# Patient Record
Sex: Female | Born: 1970 | Race: Black or African American | Hispanic: No | Marital: Single | Smoking: Never smoker
Health system: Southern US, Community
[De-identification: ages and names within clinical notes are randomized; demographics above are authoritative.]

## PROBLEM LIST (undated history)

## (undated) DIAGNOSIS — I639 Cerebral infarction, unspecified: Secondary | ICD-10-CM

## (undated) DIAGNOSIS — I10 Essential (primary) hypertension: Secondary | ICD-10-CM

---

## 2017-10-14 DIAGNOSIS — G459 Transient cerebral ischemic attack, unspecified: Secondary | ICD-10-CM

## 2017-10-14 HISTORY — DX: Transient cerebral ischemic attack, unspecified: G45.9

## 2022-01-20 ENCOUNTER — Inpatient Hospital Stay (HOSPITAL_COMMUNITY): Payer: No Typology Code available for payment source

## 2022-01-20 ENCOUNTER — Emergency Department (HOSPITAL_COMMUNITY): Payer: No Typology Code available for payment source

## 2022-01-20 ENCOUNTER — Inpatient Hospital Stay (HOSPITAL_COMMUNITY)
Admission: EM | Admit: 2022-01-20 | Discharge: 2022-03-21 | DRG: 004 | Disposition: A | Discharge status: Home Health | Payer: No Typology Code available for payment source | Attending: Student | Admitting: Student

## 2022-01-20 ENCOUNTER — Encounter (HOSPITAL_COMMUNITY): Payer: Self-pay | Admitting: Emergency Medicine

## 2022-01-20 DIAGNOSIS — I639 Cerebral infarction, unspecified: Secondary | ICD-10-CM | POA: Diagnosis present

## 2022-01-20 DIAGNOSIS — R54 Age-related physical debility: Secondary | ICD-10-CM | POA: Diagnosis present

## 2022-01-20 DIAGNOSIS — I10 Essential (primary) hypertension: Secondary | ICD-10-CM | POA: Diagnosis present

## 2022-01-20 DIAGNOSIS — R569 Unspecified convulsions: Secondary | ICD-10-CM

## 2022-01-20 DIAGNOSIS — Z931 Gastrostomy status: Secondary | ICD-10-CM | POA: Diagnosis not present

## 2022-01-20 DIAGNOSIS — G9341 Metabolic encephalopathy: Secondary | ICD-10-CM | POA: Diagnosis present

## 2022-01-20 DIAGNOSIS — J9601 Acute respiratory failure with hypoxia: Secondary | ICD-10-CM

## 2022-01-20 DIAGNOSIS — I160 Hypertensive urgency: Secondary | ICD-10-CM

## 2022-01-20 DIAGNOSIS — R6889 Other general symptoms and signs: Secondary | ICD-10-CM | POA: Diagnosis not present

## 2022-01-20 DIAGNOSIS — I674 Hypertensive encephalopathy: Secondary | ICD-10-CM | POA: Diagnosis present

## 2022-01-20 DIAGNOSIS — R7989 Other specified abnormal findings of blood chemistry: Secondary | ICD-10-CM

## 2022-01-20 DIAGNOSIS — L89892 Pressure ulcer of other site, stage 2: Secondary | ICD-10-CM | POA: Diagnosis not present

## 2022-01-20 DIAGNOSIS — G9340 Encephalopathy, unspecified: Secondary | ICD-10-CM | POA: Diagnosis present

## 2022-01-20 DIAGNOSIS — E512 Wernicke's encephalopathy: Secondary | ICD-10-CM | POA: Diagnosis present

## 2022-01-20 DIAGNOSIS — E662 Morbid (severe) obesity with alveolar hypoventilation: Secondary | ICD-10-CM | POA: Diagnosis present

## 2022-01-20 DIAGNOSIS — R339 Retention of urine, unspecified: Secondary | ICD-10-CM | POA: Diagnosis not present

## 2022-01-20 DIAGNOSIS — N179 Acute kidney failure, unspecified: Secondary | ICD-10-CM

## 2022-01-20 DIAGNOSIS — R338 Other retention of urine: Secondary | ICD-10-CM

## 2022-01-20 DIAGNOSIS — E875 Hyperkalemia: Secondary | ICD-10-CM | POA: Diagnosis not present

## 2022-01-20 DIAGNOSIS — I6340 Cerebral infarction due to embolism of unspecified cerebral artery: Secondary | ICD-10-CM | POA: Insufficient documentation

## 2022-01-20 DIAGNOSIS — R739 Hyperglycemia, unspecified: Secondary | ICD-10-CM | POA: Diagnosis not present

## 2022-01-20 DIAGNOSIS — Z515 Encounter for palliative care: Secondary | ICD-10-CM | POA: Diagnosis not present

## 2022-01-20 DIAGNOSIS — E162 Hypoglycemia, unspecified: Secondary | ICD-10-CM | POA: Diagnosis not present

## 2022-01-20 DIAGNOSIS — I6629 Occlusion and stenosis of unspecified posterior cerebral artery: Secondary | ICD-10-CM | POA: Diagnosis present

## 2022-01-20 DIAGNOSIS — Z885 Allergy status to narcotic agent status: Secondary | ICD-10-CM

## 2022-01-20 DIAGNOSIS — R29729 NIHSS score 29: Secondary | ICD-10-CM | POA: Diagnosis present

## 2022-01-20 DIAGNOSIS — Z9911 Dependence on respirator [ventilator] status: Secondary | ICD-10-CM | POA: Diagnosis not present

## 2022-01-20 DIAGNOSIS — R1310 Dysphagia, unspecified: Secondary | ICD-10-CM | POA: Diagnosis not present

## 2022-01-20 DIAGNOSIS — E874 Mixed disorder of acid-base balance: Secondary | ICD-10-CM | POA: Diagnosis present

## 2022-01-20 DIAGNOSIS — Z886 Allergy status to analgesic agent status: Secondary | ICD-10-CM

## 2022-01-20 DIAGNOSIS — I63513 Cerebral infarction due to unspecified occlusion or stenosis of bilateral middle cerebral arteries: Secondary | ICD-10-CM | POA: Diagnosis not present

## 2022-01-20 DIAGNOSIS — F10931 Alcohol use, unspecified with withdrawal delirium: Secondary | ICD-10-CM | POA: Diagnosis not present

## 2022-01-20 DIAGNOSIS — I63433 Cerebral infarction due to embolism of bilateral posterior cerebral arteries: Secondary | ICD-10-CM | POA: Diagnosis present

## 2022-01-20 DIAGNOSIS — Z87892 Personal history of anaphylaxis: Secondary | ICD-10-CM

## 2022-01-20 DIAGNOSIS — D6862 Lupus anticoagulant syndrome: Secondary | ICD-10-CM | POA: Diagnosis not present

## 2022-01-20 DIAGNOSIS — J9690 Respiratory failure, unspecified, unspecified whether with hypoxia or hypercapnia: Secondary | ICD-10-CM | POA: Diagnosis present

## 2022-01-20 DIAGNOSIS — E440 Moderate protein-calorie malnutrition: Secondary | ICD-10-CM | POA: Diagnosis present

## 2022-01-20 DIAGNOSIS — I69391 Dysphagia following cerebral infarction: Secondary | ICD-10-CM

## 2022-01-20 DIAGNOSIS — E872 Acidosis, unspecified: Secondary | ICD-10-CM

## 2022-01-20 DIAGNOSIS — J9620 Acute and chronic respiratory failure, unspecified whether with hypoxia or hypercapnia: Secondary | ICD-10-CM | POA: Diagnosis not present

## 2022-01-20 DIAGNOSIS — E878 Other disorders of electrolyte and fluid balance, not elsewhere classified: Secondary | ICD-10-CM

## 2022-01-20 DIAGNOSIS — R509 Fever, unspecified: Secondary | ICD-10-CM

## 2022-01-20 DIAGNOSIS — Z930 Tracheostomy status: Secondary | ICD-10-CM | POA: Diagnosis not present

## 2022-01-20 DIAGNOSIS — R40244 Other coma, without documented Glasgow coma scale score, or with partial score reported, unspecified time: Secondary | ICD-10-CM | POA: Diagnosis not present

## 2022-01-20 DIAGNOSIS — Z6830 Body mass index (BMI) 30.0-30.9, adult: Secondary | ICD-10-CM | POA: Diagnosis not present

## 2022-01-20 DIAGNOSIS — R4020 Unspecified coma: Secondary | ICD-10-CM | POA: Diagnosis present

## 2022-01-20 DIAGNOSIS — R4182 Altered mental status, unspecified: Secondary | ICD-10-CM | POA: Insufficient documentation

## 2022-01-20 DIAGNOSIS — E785 Hyperlipidemia, unspecified: Secondary | ICD-10-CM | POA: Diagnosis present

## 2022-01-20 DIAGNOSIS — R4189 Other symptoms and signs involving cognitive functions and awareness: Secondary | ICD-10-CM | POA: Diagnosis not present

## 2022-01-20 DIAGNOSIS — L8990 Pressure ulcer of unspecified site, unspecified stage: Secondary | ICD-10-CM | POA: Diagnosis not present

## 2022-01-20 DIAGNOSIS — R451 Restlessness and agitation: Secondary | ICD-10-CM | POA: Diagnosis present

## 2022-01-20 DIAGNOSIS — Y95 Nosocomial condition: Secondary | ICD-10-CM | POA: Diagnosis not present

## 2022-01-20 DIAGNOSIS — Z7189 Other specified counseling: Secondary | ICD-10-CM | POA: Diagnosis not present

## 2022-01-20 DIAGNOSIS — F10139 Alcohol abuse with withdrawal, unspecified: Secondary | ICD-10-CM | POA: Diagnosis present

## 2022-01-20 DIAGNOSIS — Z880 Allergy status to penicillin: Secondary | ICD-10-CM

## 2022-01-20 DIAGNOSIS — D649 Anemia, unspecified: Secondary | ICD-10-CM

## 2022-01-20 DIAGNOSIS — R1312 Dysphagia, oropharyngeal phase: Secondary | ICD-10-CM | POA: Diagnosis not present

## 2022-01-20 DIAGNOSIS — N390 Urinary tract infection, site not specified: Secondary | ICD-10-CM

## 2022-01-20 DIAGNOSIS — I6381 Other cerebral infarction due to occlusion or stenosis of small artery: Secondary | ICD-10-CM | POA: Diagnosis not present

## 2022-01-20 DIAGNOSIS — J189 Pneumonia, unspecified organism: Secondary | ICD-10-CM

## 2022-01-20 DIAGNOSIS — G908 Other disorders of autonomic nervous system: Secondary | ICD-10-CM | POA: Diagnosis present

## 2022-01-20 DIAGNOSIS — Z91018 Allergy to other foods: Secondary | ICD-10-CM

## 2022-01-20 DIAGNOSIS — Y900 Blood alcohol level of less than 20 mg/100 ml: Secondary | ICD-10-CM | POA: Diagnosis present

## 2022-01-20 DIAGNOSIS — D638 Anemia in other chronic diseases classified elsewhere: Secondary | ICD-10-CM | POA: Diagnosis present

## 2022-01-20 DIAGNOSIS — J9811 Atelectasis: Secondary | ICD-10-CM | POA: Diagnosis not present

## 2022-01-20 DIAGNOSIS — M25462 Effusion, left knee: Secondary | ICD-10-CM | POA: Diagnosis not present

## 2022-01-20 DIAGNOSIS — E876 Hypokalemia: Secondary | ICD-10-CM | POA: Diagnosis not present

## 2022-01-20 DIAGNOSIS — E870 Hyperosmolality and hypernatremia: Secondary | ICD-10-CM

## 2022-01-20 DIAGNOSIS — R6510 Systemic inflammatory response syndrome (SIRS) of non-infectious origin without acute organ dysfunction: Secondary | ICD-10-CM

## 2022-01-20 DIAGNOSIS — I471 Supraventricular tachycardia: Secondary | ICD-10-CM | POA: Diagnosis present

## 2022-01-20 DIAGNOSIS — Z888 Allergy status to other drugs, medicaments and biological substances status: Secondary | ICD-10-CM

## 2022-01-20 DIAGNOSIS — I6312 Cerebral infarction due to embolism of basilar artery: Secondary | ICD-10-CM | POA: Diagnosis not present

## 2022-01-20 DIAGNOSIS — R400 Somnolence: Secondary | ICD-10-CM

## 2022-01-20 DIAGNOSIS — D6861 Antiphospholipid syndrome: Secondary | ICD-10-CM | POA: Diagnosis present

## 2022-01-20 DIAGNOSIS — D72829 Elevated white blood cell count, unspecified: Secondary | ICD-10-CM

## 2022-01-20 DIAGNOSIS — Z833 Family history of diabetes mellitus: Secondary | ICD-10-CM

## 2022-01-20 DIAGNOSIS — Z9889 Other specified postprocedural states: Secondary | ICD-10-CM | POA: Diagnosis not present

## 2022-01-20 DIAGNOSIS — Z91148 Patient's other noncompliance with medication regimen for other reason: Secondary | ICD-10-CM

## 2022-01-20 DIAGNOSIS — Z79899 Other long term (current) drug therapy: Secondary | ICD-10-CM

## 2022-01-20 DIAGNOSIS — I6389 Other cerebral infarction: Secondary | ICD-10-CM | POA: Diagnosis not present

## 2022-01-20 DIAGNOSIS — G9089 Other disorders of autonomic nervous system: Secondary | ICD-10-CM

## 2022-01-20 HISTORY — DX: Hypertensive urgency: I16.0

## 2022-01-20 HISTORY — DX: Essential (primary) hypertension: I10

## 2022-01-20 LAB — URINALYSIS, ROUTINE W REFLEX MICROSCOPIC
Bilirubin Urine: NEGATIVE
Glucose, UA: NEGATIVE mg/dL
Ketones, ur: NEGATIVE mg/dL
Nitrite: POSITIVE — AB
Protein, ur: NEGATIVE mg/dL
Specific Gravity, Urine: 1.011 (ref 1.005–1.030)
pH: 6 (ref 5.0–8.0)

## 2022-01-20 LAB — CBC WITH DIFFERENTIAL/PLATELET
Abs Immature Granulocytes: 0.03 10*3/uL (ref 0.00–0.07)
Abs Immature Granulocytes: 0.03 10*3/uL (ref 0.00–0.07)
Basophils Absolute: 0 10*3/uL (ref 0.0–0.1)
Basophils Absolute: 0 10*3/uL (ref 0.0–0.1)
Basophils Relative: 0 %
Basophils Relative: 0 %
Eosinophils Absolute: 0.1 10*3/uL (ref 0.0–0.5)
Eosinophils Absolute: 0.1 10*3/uL (ref 0.0–0.5)
Eosinophils Relative: 1 %
Eosinophils Relative: 1 %
HCT: 40.9 % (ref 36.0–46.0)
HCT: 32.8 % — ABNORMAL LOW (ref 36.0–46.0)
Hemoglobin: 13.4 g/dL (ref 12.0–15.0)
Hemoglobin: 10.7 g/dL — ABNORMAL LOW (ref 12.0–15.0)
Immature Granulocytes: 0 %
Immature Granulocytes: 0 %
Lymphocytes Relative: 27 %
Lymphocytes Relative: 13 %
Lymphs Abs: 2.1 10*3/uL (ref 0.7–4.0)
Lymphs Abs: 1.2 10*3/uL (ref 0.7–4.0)
MCH: 27.1 pg (ref 26.0–34.0)
MCH: 27.1 pg (ref 26.0–34.0)
MCHC: 32.8 g/dL (ref 30.0–36.0)
MCHC: 32.6 g/dL (ref 30.0–36.0)
MCV: 82.8 fL (ref 80.0–100.0)
MCV: 83 fL (ref 80.0–100.0)
Monocytes Absolute: 0.6 10*3/uL (ref 0.1–1.0)
Monocytes Absolute: 0.6 10*3/uL (ref 0.1–1.0)
Monocytes Relative: 7 %
Monocytes Relative: 6 %
Neutro Abs: 5 10*3/uL (ref 1.7–7.7)
Neutro Abs: 7.6 10*3/uL (ref 1.7–7.7)
Neutrophils Relative %: 65 %
Neutrophils Relative %: 80 %
Platelets: 231 10*3/uL (ref 150–400)
Platelets: 213 10*3/uL (ref 150–400)
RBC: 4.94 MIL/uL (ref 3.87–5.11)
RBC: 3.95 MIL/uL (ref 3.87–5.11)
RDW: 15.5 % (ref 11.5–15.5)
RDW: 15.1 % (ref 11.5–15.5)
WBC: 7.8 10*3/uL (ref 4.0–10.5)
WBC: 9.6 10*3/uL (ref 4.0–10.5)
nRBC: 0 % (ref 0.0–0.2)
nRBC: 0 % (ref 0.0–0.2)

## 2022-01-20 LAB — I-STAT VENOUS BLOOD GAS, ED
Acid-Base Excess: 1 mmol/L (ref 0.0–2.0)
Bicarbonate: 25.8 mmol/L (ref 20.0–28.0)
Calcium, Ion: 1.2 mmol/L (ref 1.15–1.40)
HCT: 40 % (ref 36.0–46.0)
Hemoglobin: 13.6 g/dL (ref 12.0–15.0)
O2 Saturation: 87 %
Potassium: 4 mmol/L (ref 3.5–5.1)
Sodium: 139 mmol/L (ref 135–145)
TCO2: 27 mmol/L (ref 22–32)
pCO2, Ven: 42.2 mmHg — ABNORMAL LOW (ref 44–60)
pH, Ven: 7.394 (ref 7.25–7.43)
pO2, Ven: 54 mmHg — ABNORMAL HIGH (ref 32–45)

## 2022-01-20 LAB — COMPREHENSIVE METABOLIC PANEL
ALT: 17 U/L (ref 0–44)
AST: 15 U/L (ref 15–41)
Albumin: 3.7 g/dL (ref 3.5–5.0)
Alkaline Phosphatase: 58 U/L (ref 38–126)
Anion gap: 9 (ref 5–15)
BUN: 10 mg/dL (ref 6–20)
CO2: 24 mmol/L (ref 22–32)
Calcium: 9.4 mg/dL (ref 8.9–10.3)
Chloride: 105 mmol/L (ref 98–111)
Creatinine, Ser: 0.83 mg/dL (ref 0.44–1.00)
GFR, Estimated: >60 mL/min (ref >60)
Glucose, Bld: 87 mg/dL (ref 70–99)
Potassium: 4 mmol/L (ref 3.5–5.1)
Sodium: 138 mmol/L (ref 135–145)
Total Bilirubin: 0.6 mg/dL (ref 0.3–1.2)
Total Protein: 7.1 g/dL (ref 6.5–8.1)

## 2022-01-20 LAB — LACTIC ACID, PLASMA
Lactic Acid, Venous: 1.2 mmol/L (ref 0.5–1.9)
Lactic Acid, Venous: 3.2 mmol/L (ref 0.5–1.9)
Lactic Acid, Venous: 3.3 mmol/L (ref 0.5–1.9)

## 2022-01-20 LAB — BASIC METABOLIC PANEL
Anion gap: 6 (ref 5–15)
BUN: 10 mg/dL (ref 6–20)
CO2: 23 mmol/L (ref 22–32)
Calcium: 8.2 mg/dL — ABNORMAL LOW (ref 8.9–10.3)
Chloride: 109 mmol/L (ref 98–111)
Creatinine, Ser: 0.72 mg/dL (ref 0.44–1.00)
GFR, Estimated: >60 mL/min (ref >60)
Glucose, Bld: 113 mg/dL — ABNORMAL HIGH (ref 70–99)
Potassium: 4.4 mmol/L (ref 3.5–5.1)
Sodium: 138 mmol/L (ref 135–145)

## 2022-01-20 LAB — POCT I-STAT 7, (LYTES, BLD GAS, ICA,H+H)
Acid-base deficit: 5 mmol/L — ABNORMAL HIGH (ref 0.0–2.0)
Bicarbonate: 21.1 mmol/L (ref 20.0–28.0)
Calcium, Ion: 1.2 mmol/L (ref 1.15–1.40)
HCT: 30 % — ABNORMAL LOW (ref 36.0–46.0)
Hemoglobin: 10.2 g/dL — ABNORMAL LOW (ref 12.0–15.0)
O2 Saturation: 95 %
Patient temperature: 98.2
Potassium: 3.4 mmol/L — ABNORMAL LOW (ref 3.5–5.1)
Sodium: 141 mmol/L (ref 135–145)
TCO2: 22 mmol/L (ref 22–32)
pCO2 arterial: 39.8 mmHg (ref 32–48)
pH, Arterial: 7.33 — ABNORMAL LOW (ref 7.35–7.45)
pO2, Arterial: 82 mmHg — ABNORMAL LOW (ref 83–108)

## 2022-01-20 LAB — HEPATIC FUNCTION PANEL
ALT: 19 U/L (ref 0–44)
AST: 20 U/L (ref 15–41)
Albumin: 3 g/dL — ABNORMAL LOW (ref 3.5–5.0)
Alkaline Phosphatase: 46 U/L (ref 38–126)
Bilirubin, Direct: 0.3 mg/dL — ABNORMAL HIGH (ref 0.0–0.2)
Indirect Bilirubin: 0.4 mg/dL (ref 0.3–0.9)
Total Bilirubin: 0.7 mg/dL (ref 0.3–1.2)
Total Protein: 6.2 g/dL — ABNORMAL LOW (ref 6.5–8.1)

## 2022-01-20 LAB — I-STAT ARTERIAL BLOOD GAS, ED
Acid-base deficit: 1 mmol/L (ref 0.0–2.0)
Bicarbonate: 26.4 mmol/L (ref 20.0–28.0)
Calcium, Ion: 1.18 mmol/L (ref 1.15–1.40)
HCT: 33 % — ABNORMAL LOW (ref 36.0–46.0)
Hemoglobin: 11.2 g/dL — ABNORMAL LOW (ref 12.0–15.0)
O2 Saturation: 95 %
Potassium: 3.6 mmol/L (ref 3.5–5.1)
Sodium: 138 mmol/L (ref 135–145)
TCO2: 28 mmol/L (ref 22–32)
pCO2 arterial: 52.8 mmHg — ABNORMAL HIGH (ref 32–48)
pH, Arterial: 7.307 — ABNORMAL LOW (ref 7.35–7.45)
pO2, Arterial: 84 mmHg (ref 83–108)

## 2022-01-20 LAB — RAPID URINE DRUG SCREEN, HOSP PERFORMED
Amphetamines: NOT DETECTED
Barbiturates: NOT DETECTED
Benzodiazepines: NOT DETECTED
Cocaine: NOT DETECTED
Opiates: NOT DETECTED
Tetrahydrocannabinol: NOT DETECTED

## 2022-01-20 LAB — CREATININE, SERUM
Creatinine, Ser: 0.76 mg/dL (ref 0.44–1.00)
GFR, Estimated: >60 mL/min (ref >60)

## 2022-01-20 LAB — CBG MONITORING, ED
Glucose-Capillary: 57 mg/dL — ABNORMAL LOW (ref 70–99)
Glucose-Capillary: 78 mg/dL (ref 70–99)
Glucose-Capillary: 87 mg/dL (ref 70–99)

## 2022-01-20 LAB — PHOSPHORUS: Phosphorus: 3.4 mg/dL (ref 2.5–4.6)

## 2022-01-20 LAB — AMMONIA
Ammonia: 28 umol/L (ref 9–35)
Ammonia: 49 umol/L — ABNORMAL HIGH (ref 9–35)

## 2022-01-20 LAB — PROCALCITONIN: Procalcitonin: <0.1 ng/mL

## 2022-01-20 LAB — MAGNESIUM: Magnesium: 1.8 mg/dL (ref 1.7–2.4)

## 2022-01-20 LAB — POTASSIUM: Potassium: 3.7 mmol/L (ref 3.5–5.1)

## 2022-01-20 LAB — I-STAT CHEM 8, ED
BUN: 11 mg/dL (ref 6–20)
Calcium, Ion: 1.18 mmol/L (ref 1.15–1.40)
Chloride: 105 mmol/L (ref 98–111)
Creatinine, Ser: 0.7 mg/dL (ref 0.44–1.00)
Glucose, Bld: 84 mg/dL (ref 70–99)
HCT: 41 % (ref 36.0–46.0)
Hemoglobin: 13.9 g/dL (ref 12.0–15.0)
Potassium: 4 mmol/L (ref 3.5–5.1)
Sodium: 138 mmol/L (ref 135–145)
TCO2: 25 mmol/L (ref 22–32)

## 2022-01-20 LAB — GLUCOSE, CAPILLARY
Glucose-Capillary: 111 mg/dL — ABNORMAL HIGH (ref 70–99)
Glucose-Capillary: 117 mg/dL — ABNORMAL HIGH (ref 70–99)
Glucose-Capillary: 121 mg/dL — ABNORMAL HIGH (ref 70–99)

## 2022-01-20 LAB — HIV ANTIBODY (ROUTINE TESTING W REFLEX): HIV Screen 4th Generation wRfx: NONREACTIVE

## 2022-01-20 LAB — PROTIME-INR
INR: 1.2 (ref 0.8–1.2)
Prothrombin Time: 14.6 seconds (ref 11.4–15.2)

## 2022-01-20 LAB — APTT: aPTT: 29 seconds (ref 24–36)

## 2022-01-20 LAB — STREP PNEUMONIAE URINARY ANTIGEN: Strep Pneumo Urinary Antigen: NEGATIVE

## 2022-01-20 LAB — MRSA NEXT GEN BY PCR, NASAL: MRSA by PCR Next Gen: NOT DETECTED

## 2022-01-20 LAB — CORTISOL: Cortisol, Plasma: 16.9 ug/dL

## 2022-01-20 LAB — ETHANOL: Alcohol, Ethyl (B): <10 mg/dL (ref <10)

## 2022-01-20 IMAGING — CT CT HEAD W/O CM
4 series · 15 of 37 positions shown, 17 images · non-contrast
Comparison: None.

CLINICAL DATA: Altered mental status



[Series 2: head wo · axial · 0.46mm/px · z∈[-125,-35]mm · 4 of 32 slices shown (1 of 2)]
[im 7/32  brain]
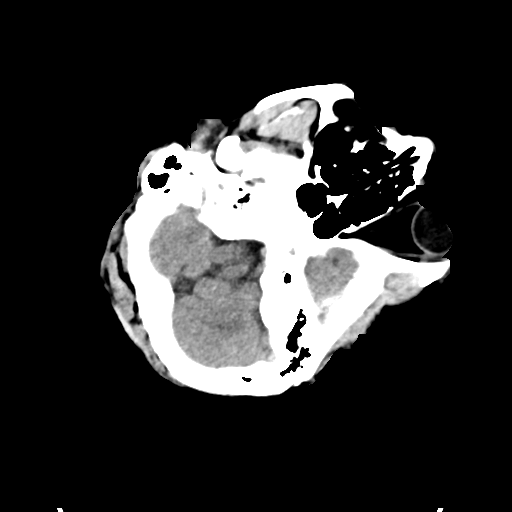
[im 13/32  brain]
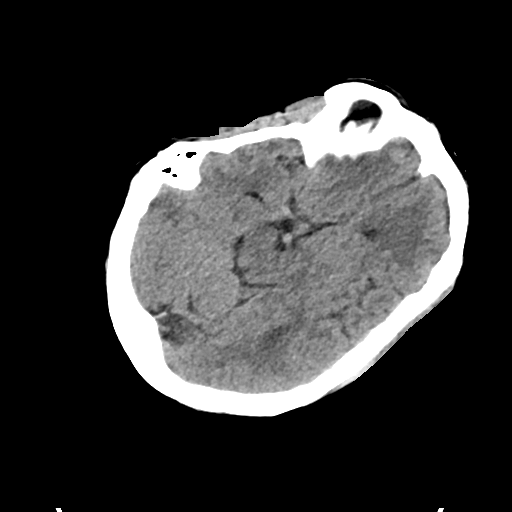
[im 19/32  brain]
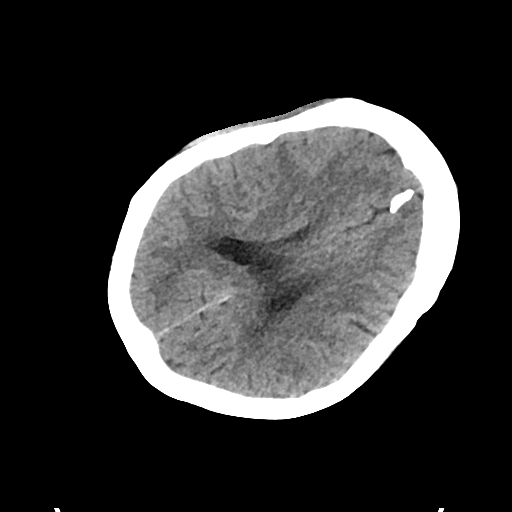
[im 25/32  brain]
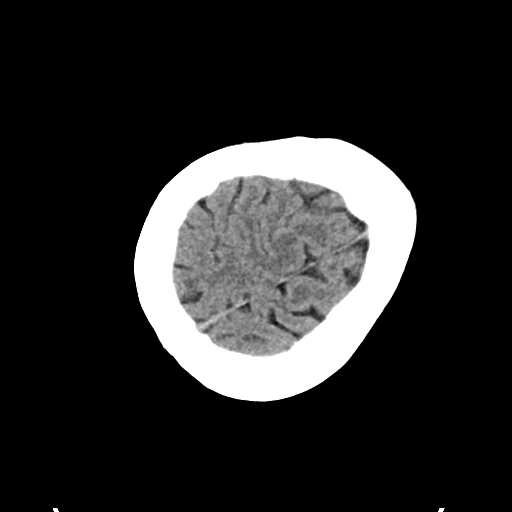

[Series 3: head bone · axial · 0.46mm/px · z∈[-145,-125]mm · 2 of 79 slices shown]
[im 6/79  bone]
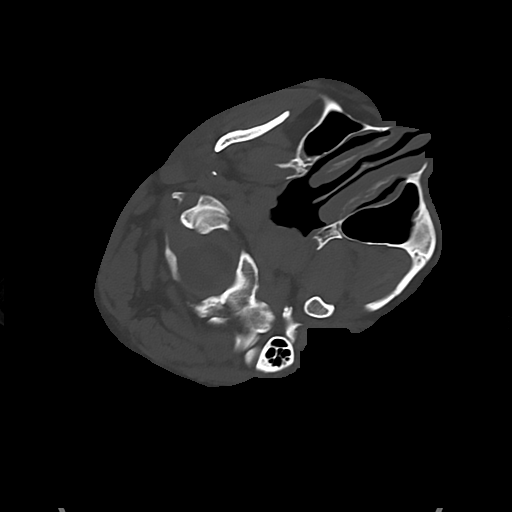
[im 16/79  bone]
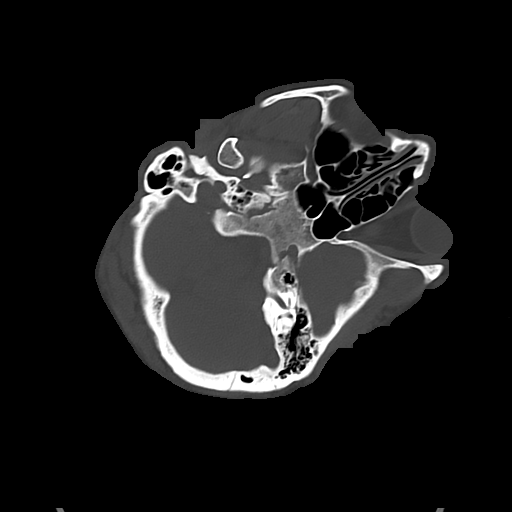

[Series 4: cor soft · sagittal · 0.35mm/px · 3 of 88 slices shown]
[im 30/88  brain]
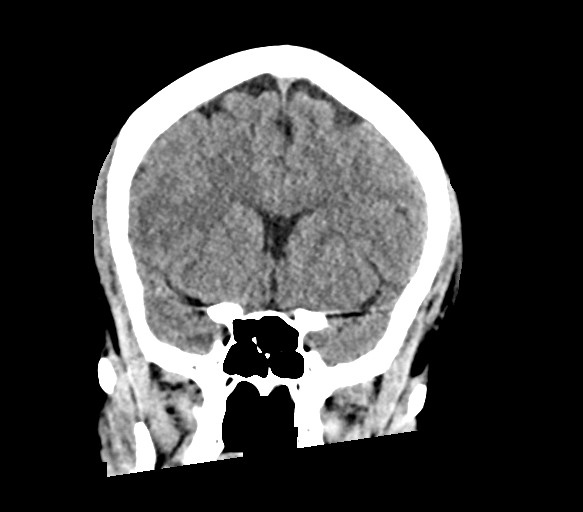
[im 44/88  brain]
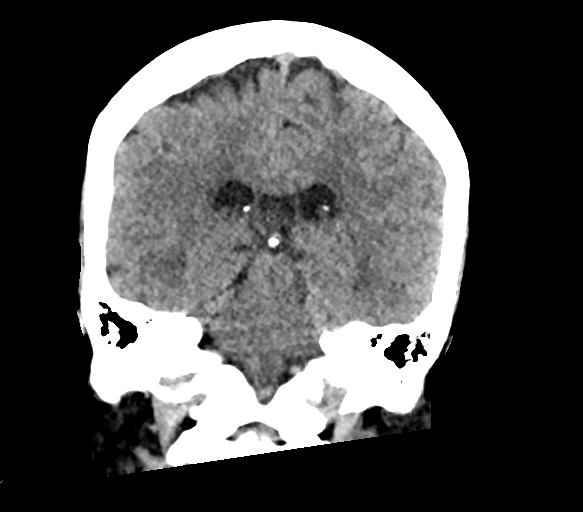
[im 59/88  brain]
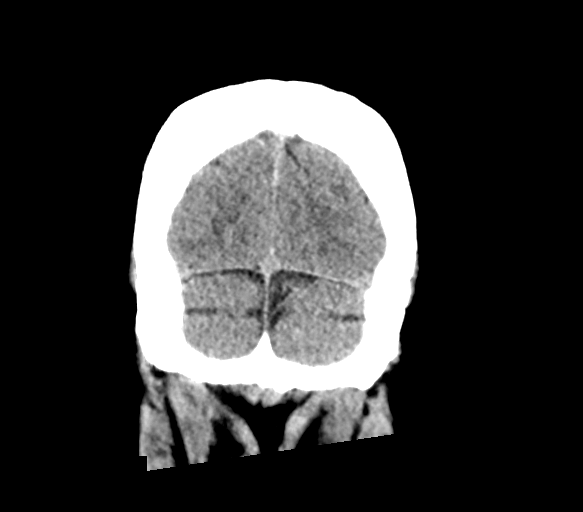

[Series 6: head wo · axial · 0.40mm/px · z∈[-126,+2]mm · 6 of 38 slices shown, 8 images (2 of 2)]
[im 6/38  brain]
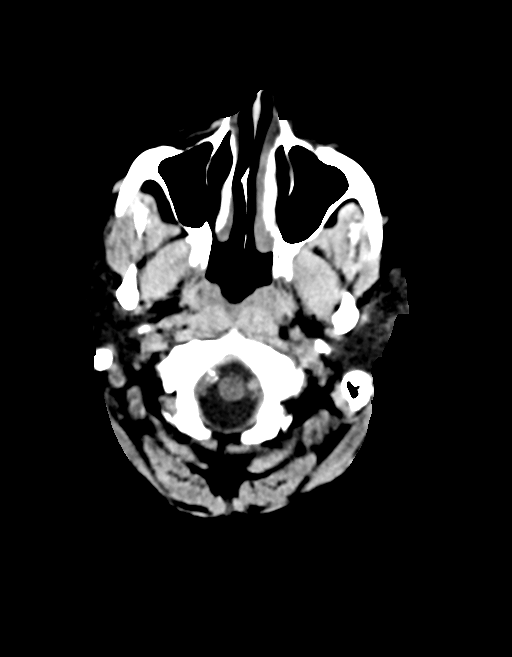
[im 6/38  bone]
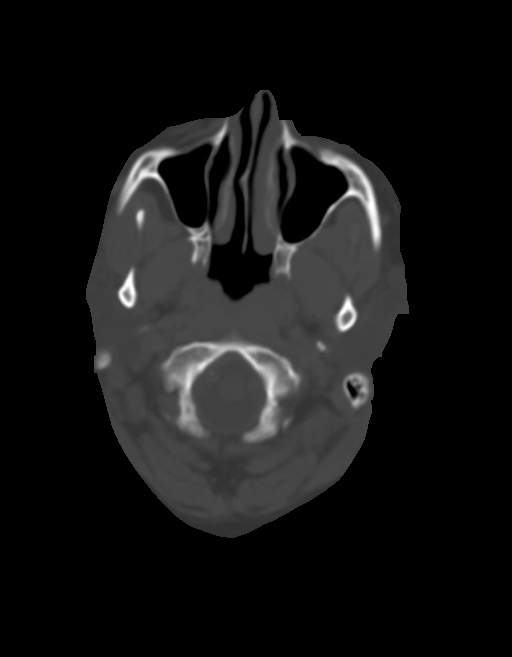
[im 11/38  brain]
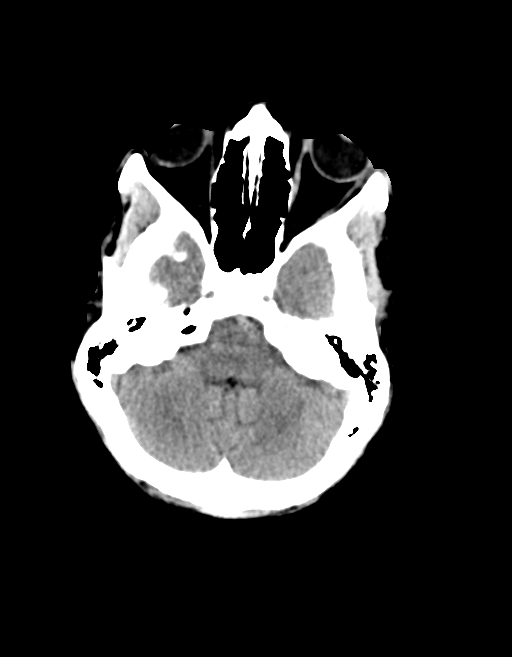
[im 16/38  brain]
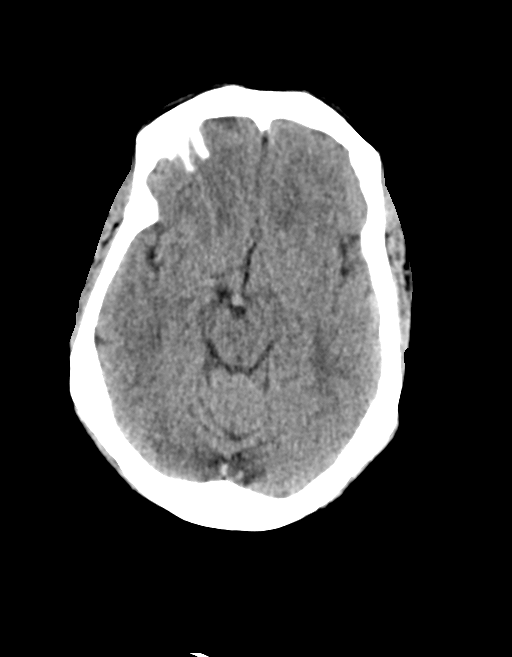
[im 22/38  brain]
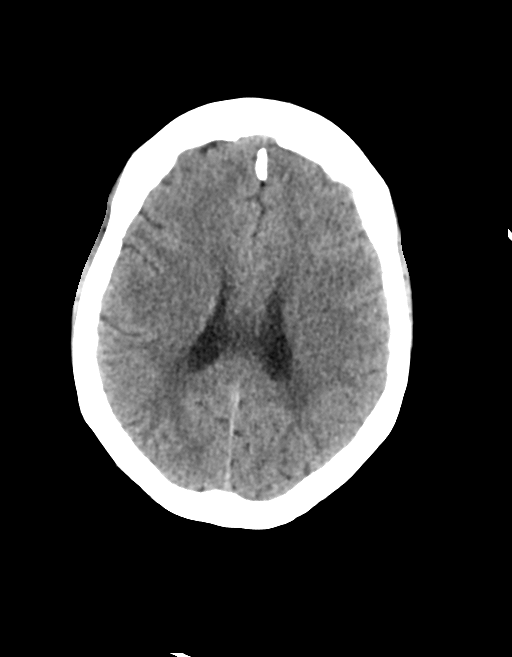
[im 27/38  brain]
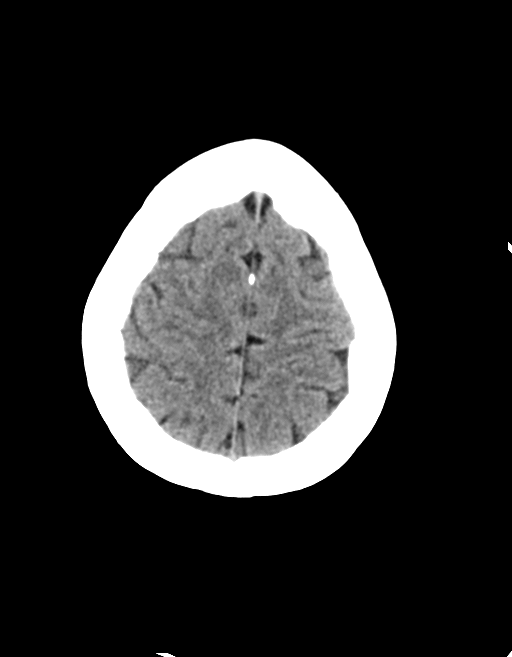
[im 27/38  bone]
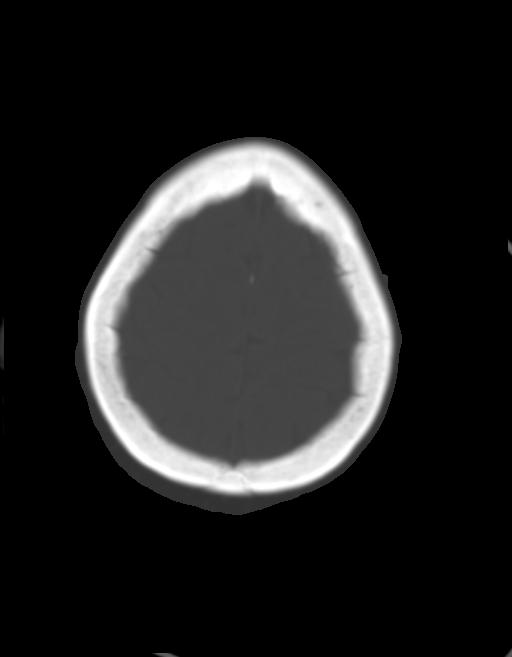
[im 32/38  brain]
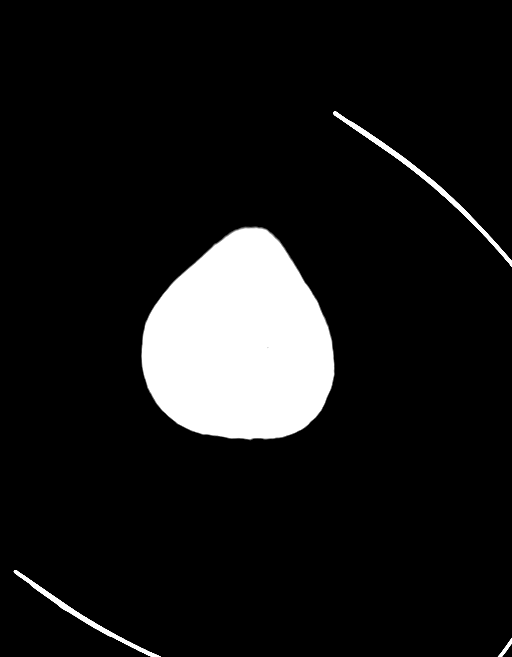

[15 of 37 positions shown; findings below may reference images not displayed]

FINDINGS: Brain: No evidence of acute infarction, hemorrhage, hydrocephalus,
extra-axial collection or mass lesion/mass effect.

Vascular: No hyperdense vessel or unexpected calcification.

Skull: Normal. Negative for fracture or focal lesion.

Sinuses/Orbits: No acute finding.

Other: None.
IMPRESSION: No acute intracranial abnormality noted.

## 2022-01-20 IMAGING — DX DG CHEST 1V PORT
1 series · 1 of 1 positions shown · non-contrast
Comparison: [DATE], earlier same day

CLINICAL DATA: Endotracheal tube placement.

EXAM:
PORTABLE CHEST 1 VIEW

[chest ap]
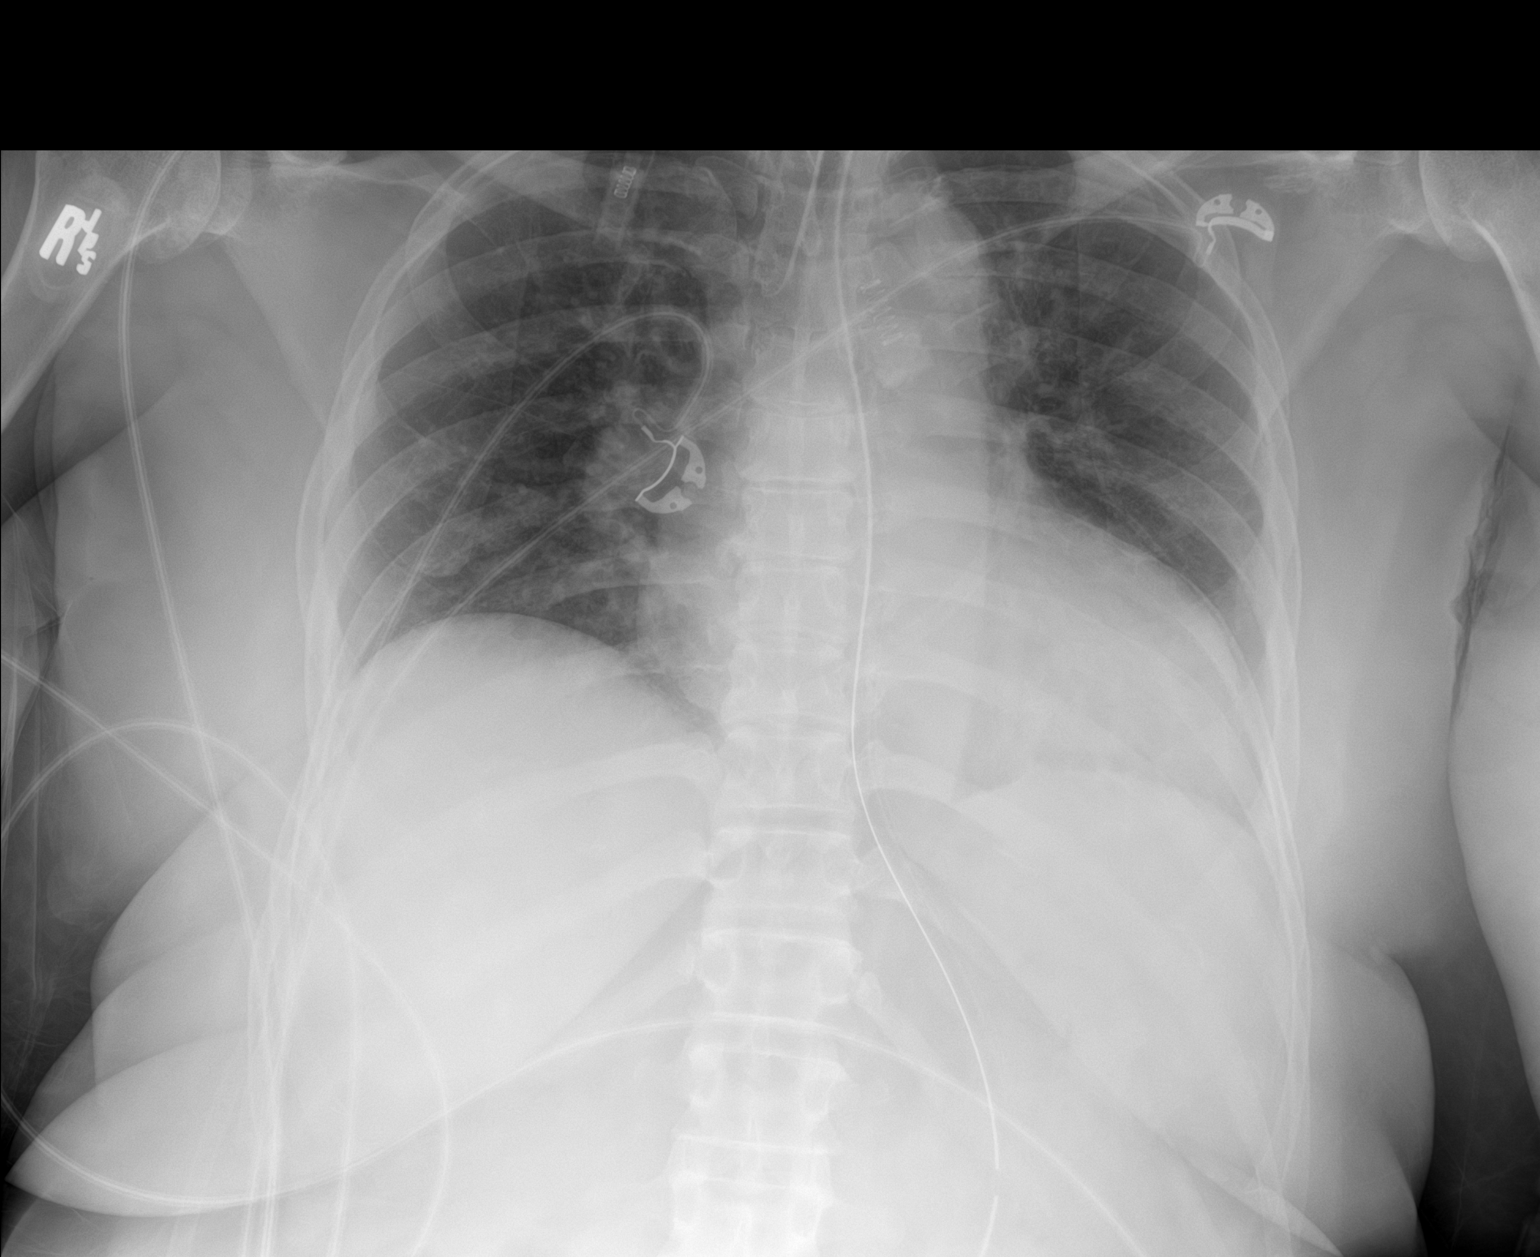

[1 of 1 positions shown; findings below may reference images not displayed]

FINDINGS: [13] hours. Endotracheal tube tip is 2.3 cm above the base of the
carina. The NG tube passes into the stomach although the distal tip
position is not included on the film. The cardio pericardial
silhouette is enlarged. Low volume film with vascular congestion. No
focal consolidation or substantial pleural effusion. Telemetry leads
overlie the chest.
IMPRESSION: 1. Endotracheal tube tip is 2.3 cm above the base of the carina.
2. Low volume film with vascular congestion.

## 2022-01-20 IMAGING — DX DG CHEST 1V PORT
1 series · 1 of 1 positions shown · non-contrast
Comparison: PA Lat [DATE].

CLINICAL DATA: Altered mental status, found unresponsive.

EXAM:
PORTABLE CHEST 1 VIEW

[chest ap]
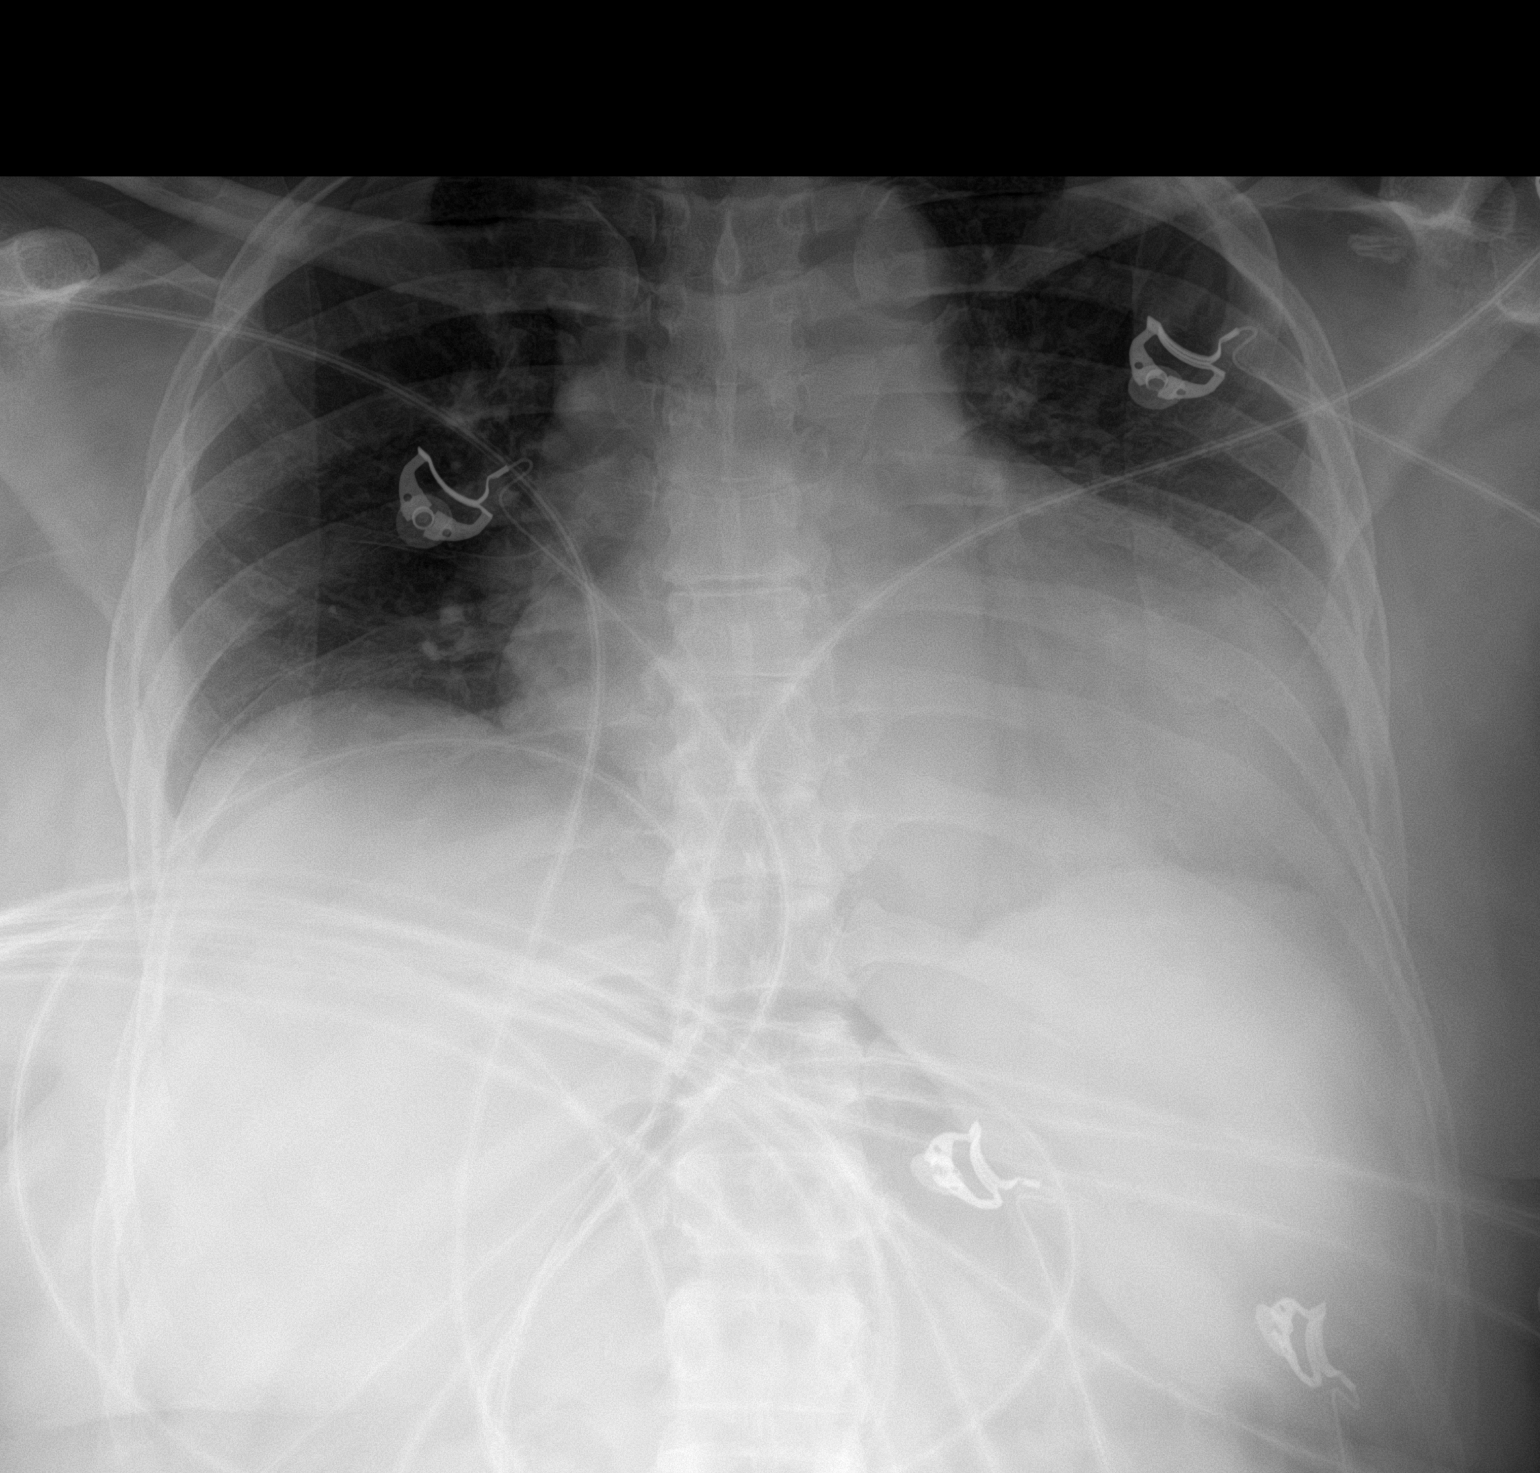

[1 of 1 positions shown; findings below may reference images not displayed]

FINDINGS: The heart is moderately enlarged. Central vessels are normal caliber
and no edema is seen.

There is linear atelectasis or scarring peripherally in the left mid
lung.

The visualized lungs are otherwise clear with limited view of the
right base due to an asymmetrically elevated right hemidiaphragm.
The mediastinum is normally outlined.

No pleural effusion is seen. No acute skeletal findings. Multiple
overlying monitor wires.
IMPRESSION: Cardiomegaly, may have increased since [DI] or could be exaggerated
due to positioning and low inspiration. There are no findings
suspect for acute CHF or pneumonia.

## 2022-01-20 MED ORDER — NITROGLYCERIN IN D5W 200-5 MCG/ML-% IV SOLN
0.0000 ug/min | INTRAVENOUS | Status: DC
  Administered 2022-01-20: 60 ug/min via INTRAVENOUS
  Administered 2022-01-20: 50 ug/min via INTRAVENOUS
  Administered 2022-01-20: 70 ug/min via INTRAVENOUS
  Filled 2022-01-20 (×2): qty 250

## 2022-01-20 MED ORDER — POLYETHYLENE GLYCOL 3350 17 G PO PACK: 17.0000 g | PACK | Freq: Every day | ORAL | Status: DC | PRN

## 2022-01-20 MED ORDER — DEXTROSE-NACL 5-0.9 % IV SOLN: INTRAVENOUS | Status: DC

## 2022-01-20 MED ORDER — THIAMINE HCL 100 MG/ML IJ SOLN
500.0000 mg | Freq: Three times a day (TID) | INTRAVENOUS | Status: AC
  Administered 2022-01-20 – 2022-01-22 (×9): 500 mg via INTRAVENOUS
  Filled 2022-01-20 (×11): qty 5

## 2022-01-20 MED ORDER — ROCURONIUM BROMIDE 10 MG/ML (PF) SYRINGE
PREFILLED_SYRINGE | INTRAVENOUS | Status: AC
  Filled 2022-01-20: qty 10

## 2022-01-20 MED ORDER — DOCUSATE SODIUM 100 MG PO CAPS: 100.0000 mg | ORAL_CAPSULE | Freq: Two times a day (BID) | ORAL | Status: DC | PRN

## 2022-01-20 MED ORDER — ADULT MULTIVITAMIN W/MINERALS CH: 1.0000 | ORAL_TABLET | Freq: Every day | ORAL | Status: DC

## 2022-01-20 MED ORDER — ROCURONIUM BROMIDE 50 MG/5ML IV SOLN
50.0000 mg | Freq: Once | INTRAVENOUS | Status: AC
  Administered 2022-01-20: 50 mg via INTRAVENOUS
  Filled 2022-01-20: qty 5

## 2022-01-20 MED ORDER — SODIUM CHLORIDE 0.9 % IV SOLN
2.0000 g | INTRAVENOUS | Status: AC
  Administered 2022-01-20 – 2022-01-24 (×5): 2 g via INTRAVENOUS
  Filled 2022-01-20 (×5): qty 20

## 2022-01-20 MED ORDER — VITAL HIGH PROTEIN PO LIQD
1000.0000 mL | ORAL | Status: DC
  Administered 2022-01-20: 1000 mL

## 2022-01-20 MED ORDER — PANTOPRAZOLE SODIUM 40 MG IV SOLR
40.0000 mg | Freq: Every day | INTRAVENOUS | Status: DC
  Administered 2022-01-20: 40 mg via INTRAVENOUS
  Filled 2022-01-20: qty 10

## 2022-01-20 MED ORDER — FENTANYL CITRATE PF 50 MCG/ML IJ SOSY
PREFILLED_SYRINGE | INTRAMUSCULAR | Status: AC
  Administered 2022-01-20: 100 ug
  Filled 2022-01-20: qty 2

## 2022-01-20 MED ORDER — LABETALOL HCL 5 MG/ML IV SOLN
10.0000 mg | INTRAVENOUS | Status: DC | PRN
  Administered 2022-01-20 – 2022-01-25 (×3): 10 mg via INTRAVENOUS
  Filled 2022-01-20 (×3): qty 4

## 2022-01-20 MED ORDER — CHLORHEXIDINE GLUCONATE 0.12% ORAL RINSE (MEDLINE KIT)
15.0000 mL | Freq: Two times a day (BID) | OROMUCOSAL | Status: DC
  Administered 2022-01-20 – 2022-03-05 (×89): 15 mL via OROMUCOSAL

## 2022-01-20 MED ORDER — LORAZEPAM 1 MG PO TABS: 1.0000 mg | ORAL_TABLET | ORAL | Status: DC | PRN

## 2022-01-20 MED ORDER — LORAZEPAM 2 MG/ML IJ SOLN
1.0000 mg | Freq: Four times a day (QID) | INTRAMUSCULAR | Status: DC | PRN
  Administered 2022-01-20 (×2): 2 mg via INTRAVENOUS
  Filled 2022-01-20: qty 1

## 2022-01-20 MED ORDER — ETOMIDATE 2 MG/ML IV SOLN
20.0000 mg | Freq: Once | INTRAVENOUS | Status: AC
  Administered 2022-01-20: 20 mg via INTRAVENOUS

## 2022-01-20 MED ORDER — ORAL CARE MOUTH RINSE
15.0000 mL | OROMUCOSAL | Status: DC
  Administered 2022-01-20 – 2022-03-06 (×420): 15 mL via OROMUCOSAL

## 2022-01-20 MED ORDER — SODIUM CHLORIDE 0.9 % IV BOLUS
1000.0000 mL | Freq: Once | INTRAVENOUS | Status: AC
  Administered 2022-01-20: 1000 mL via INTRAVENOUS

## 2022-01-20 MED ORDER — THIAMINE HCL 100 MG/ML IJ SOLN
100.0000 mg | Freq: Once | INTRAMUSCULAR | Status: AC
Start: 2022-01-20 — End: 2022-01-20
  Administered 2022-01-20: 100 mg via INTRAVENOUS
  Filled 2022-01-20: qty 2

## 2022-01-20 MED ORDER — MIDAZOLAM HCL 2 MG/2ML IJ SOLN
INTRAMUSCULAR | Status: AC
  Filled 2022-01-20: qty 2

## 2022-01-20 MED ORDER — LABETALOL HCL 5 MG/ML IV SOLN: 20.0000 mg | INTRAVENOUS | Status: DC | PRN

## 2022-01-20 MED ORDER — LEVETIRACETAM IN NACL 1000 MG/100ML IV SOLN
1000.0000 mg | Freq: Two times a day (BID) | INTRAVENOUS | Status: DC
  Administered 2022-01-21 – 2022-01-26 (×12): 1000 mg via INTRAVENOUS
  Filled 2022-01-20 (×12): qty 100

## 2022-01-20 MED ORDER — SODIUM CHLORIDE 0.9 % IV SOLN: INTRAVENOUS | Status: DC

## 2022-01-20 MED ORDER — PROSOURCE TF PO LIQD
45.0000 mL | Freq: Two times a day (BID) | ORAL | Status: DC
  Administered 2022-01-20 – 2022-01-21 (×2): 45 mL
  Filled 2022-01-20 (×3): qty 45

## 2022-01-20 MED ORDER — FENTANYL CITRATE PF 50 MCG/ML IJ SOSY: 100.0000 ug | PREFILLED_SYRINGE | Freq: Once | INTRAMUSCULAR | Status: AC

## 2022-01-20 MED ORDER — POTASSIUM CHLORIDE 10 MEQ/100ML IV SOLN
10.0000 meq | INTRAVENOUS | Status: AC
  Administered 2022-01-20 (×2): 10 meq via INTRAVENOUS
  Filled 2022-01-20 (×2): qty 100

## 2022-01-20 MED ORDER — THIAMINE HCL 100 MG/ML IJ SOLN: 100.0000 mg | Freq: Every day | INTRAMUSCULAR | Status: DC

## 2022-01-20 MED ORDER — ETOMIDATE 2 MG/ML IV SOLN
INTRAVENOUS | Status: AC
  Filled 2022-01-20: qty 20

## 2022-01-20 MED ORDER — CHLORHEXIDINE GLUCONATE CLOTH 2 % EX PADS
6.0000 | MEDICATED_PAD | Freq: Every day | CUTANEOUS | Status: DC
  Administered 2022-01-20 – 2022-03-20 (×49): 6 via TOPICAL

## 2022-01-20 MED ORDER — SODIUM CHLORIDE 0.9 % IV SOLN
1.0000 mg | Freq: Once | INTRAVENOUS | Status: AC
  Administered 2022-01-20: 1 mg via INTRAVENOUS
  Filled 2022-01-20: qty 0.2

## 2022-01-20 MED ORDER — PHENOBARBITAL SODIUM 65 MG/ML IJ SOLN
32.5000 mg | Freq: Three times a day (TID) | INTRAMUSCULAR | Status: AC
  Administered 2022-01-25 – 2022-01-26 (×6): 32.5 mg via INTRAVENOUS
  Filled 2022-01-20 (×6): qty 1

## 2022-01-20 MED ORDER — FOLIC ACID 1 MG PO TABS: 1.0000 mg | ORAL_TABLET | Freq: Every day | ORAL | Status: DC

## 2022-01-20 MED ORDER — POTASSIUM CHLORIDE 20 MEQ PO PACK
40.0000 meq | PACK | Freq: Once | ORAL | Status: AC
  Administered 2022-01-20: 40 meq
  Filled 2022-01-20: qty 2

## 2022-01-20 MED ORDER — PHENOBARBITAL SODIUM 130 MG/ML IJ SOLN
97.5000 mg | Freq: Three times a day (TID) | INTRAMUSCULAR | Status: AC
  Administered 2022-01-21 – 2022-01-22 (×5): 97.5 mg via INTRAVENOUS
  Filled 2022-01-20 (×5): qty 1

## 2022-01-20 MED ORDER — MIDAZOLAM HCL 2 MG/2ML IJ SOLN
2.0000 mg | Freq: Once | INTRAMUSCULAR | Status: AC
  Administered 2022-01-20: 2 mg via INTRAVENOUS

## 2022-01-20 MED ORDER — SODIUM CHLORIDE 0.9 % IV SOLN
260.0000 mg | Freq: Once | INTRAVENOUS | Status: AC
  Administered 2022-01-20: 260 mg via INTRAVENOUS
  Filled 2022-01-20: qty 2

## 2022-01-20 MED ORDER — PHENOBARBITAL SODIUM 65 MG/ML IJ SOLN
65.0000 mg | Freq: Three times a day (TID) | INTRAMUSCULAR | Status: AC
  Administered 2022-01-23 – 2022-01-24 (×6): 65 mg via INTRAVENOUS
  Filled 2022-01-20 (×6): qty 1

## 2022-01-20 MED ORDER — DEXTROSE 50 % IV SOLN: 1.0000 | Freq: Once | INTRAVENOUS | Status: AC

## 2022-01-20 MED ORDER — DEXMEDETOMIDINE HCL IN NACL 400 MCG/100ML IV SOLN
0.4000 ug/kg/h | INTRAVENOUS | Status: DC
  Administered 2022-01-20: 0.4 ug/kg/h via INTRAVENOUS
  Administered 2022-01-20 – 2022-01-21 (×2): 0.6 ug/kg/h via INTRAVENOUS
  Administered 2022-01-21: 0.4 ug/kg/h via INTRAVENOUS
  Administered 2022-01-22: 0.5 ug/kg/h via INTRAVENOUS
  Administered 2022-01-22: 0.3 ug/kg/h via INTRAVENOUS
  Administered 2022-01-23: 0.5 ug/kg/h via INTRAVENOUS
  Administered 2022-01-24: 1 ug/kg/h via INTRAVENOUS
  Filled 2022-01-20 (×10): qty 100

## 2022-01-20 MED ORDER — HYDRALAZINE HCL 20 MG/ML IJ SOLN
20.0000 mg | INTRAMUSCULAR | Status: DC | PRN
  Administered 2022-01-20: 20 mg via INTRAVENOUS
  Filled 2022-01-20: qty 1

## 2022-01-20 MED ORDER — SODIUM CHLORIDE 0.9 % IV SOLN
2000.0000 mg | Freq: Once | INTRAVENOUS | Status: AC
  Administered 2022-01-20: 2000 mg via INTRAVENOUS
  Filled 2022-01-20: qty 20

## 2022-01-20 MED ORDER — AMLODIPINE BESYLATE 5 MG PO TABS
5.0000 mg | ORAL_TABLET | Freq: Every day | ORAL | Status: DC
  Administered 2022-01-20 – 2022-01-27 (×8): 5 mg
  Filled 2022-01-20 (×8): qty 1

## 2022-01-20 MED ORDER — DEXTROSE 50 % IV SOLN
INTRAVENOUS | Status: AC
  Administered 2022-01-20: 50 mL via INTRAVENOUS
  Filled 2022-01-20: qty 50

## 2022-01-20 MED ORDER — THIAMINE HCL 100 MG PO TABS: 100.0000 mg | ORAL_TABLET | Freq: Every day | ORAL | Status: DC

## 2022-01-20 MED ORDER — FENTANYL 2500MCG IN NS 250ML (10MCG/ML) PREMIX INFUSION
0.0000 ug/h | INTRAVENOUS | Status: DC
  Administered 2022-01-20: 25 ug/h via INTRAVENOUS
  Filled 2022-01-20: qty 250

## 2022-01-20 MED ORDER — LORAZEPAM 2 MG/ML IJ SOLN
1.0000 mg | INTRAMUSCULAR | Status: DC | PRN
  Administered 2022-01-20 (×3): 2 mg via INTRAVENOUS
  Filled 2022-01-20 (×4): qty 1

## 2022-01-20 MED ORDER — LORAZEPAM 2 MG/ML IJ SOLN
2.0000 mg | Freq: Once | INTRAMUSCULAR | Status: AC
  Administered 2022-01-20: 2 mg via INTRAVENOUS
  Filled 2022-01-20: qty 1

## 2022-01-20 MED ORDER — SODIUM CHLORIDE 0.9 % IV SOLN
2.0000 g | Freq: Once | INTRAVENOUS | Status: AC
  Administered 2022-01-20: 2 g via INTRAVENOUS
  Filled 2022-01-20: qty 20

## 2022-01-20 MED ORDER — ENOXAPARIN SODIUM 40 MG/0.4ML IJ SOSY
40.0000 mg | PREFILLED_SYRINGE | INTRAMUSCULAR | Status: DC
Start: 2022-01-20 — End: 2022-02-09
  Administered 2022-01-20 – 2022-02-09 (×21): 40 mg via SUBCUTANEOUS
  Filled 2022-01-20 (×21): qty 0.4

## 2022-01-20 NOTE — ED Notes (Signed)
Family called this RN back into room, stating pt was trying to get out of bed and needed to urinate. RN explained purpose of Purewick to pt (altered) and pt's family at bedside and placed external urinary catheter on pt.  ?

## 2022-01-20 NOTE — Progress Notes (Signed)
Patient arrived to [facility]N with belongings including hair, clothes and shoes. ?

## 2022-01-20 NOTE — Consult Note (Signed)
? ?NAME:  Cindy Brooks, MRN:  [id number], DOB:  03-25-1971, LOS: 0 ?ADMISSION DATE:  01/20/2022, CONSULTATION DATE: 01/20/2022 ?REFERRING MD: Triad, CHIEF COMPLAINT: Altered mental status ? ?History of Present Illness:  ?51 year old female who was found outside by family and was moved to the house and left for 5 hours at which time she did not move questional she had seizures therefore emergency medical services was activated.  She is transferred to [facility] emergency department near obtunded has extremely loud respirations copious secretions and difficulty controlling her airway.  She has been seen by neurology.  She admitted to the intensive care unit for further evaluation and treatment. ? ?Pertinent  Medical History  ? ?Past Medical History:  ?Diagnosis Date  ? Hypertension   ? TIA (transient ischemic attack) 2019  ? ? ? ?Significant Hospital Events: ?Including procedures, antibiotic start and stop dates in addition to other pertinent events   ? ? ?Interim History / Subjective:  ?Decreased level of consciousness therefore be admitted to the ICU ? ?Objective   ?Blood pressure (!) 177/113, pulse (!) 102, temperature (!) 97.2 ?F (36.2 ?C), temperature source Temporal, resp. rate (!) 30, SpO2 97 %. ?   ?   ? ?Intake/Output Summary (Last 24 hours) at 01/20/2022 0955 ?Last data filed at 01/20/2022 0851 ?Gross per 24 hour  ?Intake 1451.97 ml  ?Output --  ?Net 1451.97 ml  ? ?There were no vitals filed for this visit. ? ?Examination: ?General: Obese female who is near obtunded but  can be aroused with noxious stimuli ?HENT: No JVD lymphadenopathy is appreciated noisy respirations are noted ?Lungs: Coarse rhonchi bilaterally ?Cardiovascular: Heart sounds are regular ?Abdomen: Obese soft nontender ?Extremities: 1+ edema ?Neuro: Neuro obtunded ?GU: Amber urine to be awake ? ?Resolved Hospital Problem list   ? ? ?Assessment & Plan:  ?Altered mental status in the setting of suspected substance abuse, alcohol abuse and reported  seizure activity. ?Drug screen is negative ?Neurology is involved ?Standard pharmaceutical interventions for alcohol withdrawal ?Admit to the intensive care unit as she is a slightly compromised airway at this time. ?Thiamine ?Folic acid ?Plus or minus antiepileptics per neurology did not have a history of her being on antiepileptics before. ?EEG ? ?Questionable UTI ?Antimicrobial therapy ? ?Hypertension ?Antihypertensives as needed ? ?Best Practice (right click and "Reselect all SmartList Selections" daily)  ? ?Diet/type: NPO ?DVT prophylaxis: LMWH ?GI prophylaxis: PPI ?Lines: N/A ?Foley:  N/A ?Code Status:  full code ?Last date of multidisciplinary goals of care discussion [tbd] ? ?Labs   ?CBC: ?Recent Labs  ?Lab 01/20/22 ?0128 01/20/22 ?0148 01/20/22 ?0149  ?WBC 7.8  --   --   ?NEUTROABS 5.0  --   --   ?HGB 13.4 13.6 13.9  ?HCT 40.9 40.0 41.0  ?MCV 82.8  --   --   ?PLT 231  --   --   ? ? ?Basic Metabolic Panel: ?Recent Labs  ?Lab 01/20/22 ?0128 01/20/22 ?0148 01/20/22 ?0149 01/20/22 ?0700  ?NA 138 139 138 138  ?K 4.0 4.0 4.0 4.4  ?CL 105  --  105 109  ?CO2 24  --   --  23  ?GLUCOSE 87  --  84 113*  ?BUN 10  --  11 10  ?CREATININE 0.83  --  0.70 0.72  ?CALCIUM 9.4  --   --  8.2*  ? ?GFR: ?CrCl cannot be calculated (Unknown ideal weight.). ?Recent Labs  ?Lab 01/20/22 ?0128  ?WBC 7.8  ?LATICACIDVEN 1.2  ? ? ?Liver Function  Tests: ?Recent Labs  ?Lab 01/20/22 ?0128 01/20/22 ?0700  ?AST 15 20  ?ALT 17 19  ?ALKPHOS 58 46  ?BILITOT 0.6 0.7  ?PROT 7.1 6.2*  ?ALBUMIN 3.7 3.0*  ? ?No results for input(s): LIPASE, AMYLASE in the last 168 hours. ?Recent Labs  ?Lab 01/20/22 ?0128 01/20/22 ?0700  ?AMMONIA 28 49*  ? ? ?ABG ?   ?Component Value Date/Time  ? HCO3 25.8 01/20/2022 0148  ? TCO2 25 01/20/2022 0149  ? O2SAT 87 01/20/2022 0148  ?  ? ?Coagulation Profile: ?No results for input(s): INR, PROTIME in the last 168 hours. ? ?Cardiac Enzymes: ?No results for input(s): CKTOTAL, CKMB, CKMBINDEX, TROPONINI in the last 168  hours. ? ?HbA1C: ?No results found for: HGBA1C ? ?CBG: ?Recent Labs  ?Lab 01/20/22 ?0300 01/20/22 ?0736  ?GLUCAP 57* 78  ? ? ?Review of Systems:   ?na ? ?Past Medical History:  ?She,  has a past medical history of Hypertension and TIA (transient ischemic attack) (2019).  ? ?Surgical History:  ?History reviewed. No pertinent surgical history.  ? ?Social History:  ? reports that she has never smoked. She has never used smokeless tobacco. She reports current alcohol use. She reports that she does not use drugs.  ? ?Family History:  ?Her family history includes Diabetes Mellitus II in her maternal grandmother.  ? ?Allergies ?Not on File  ? ?Home Medications  ?Prior to Admission medications   ?Not on File  ?  ? ?Critical care time: 23 min ?  ? ?Brett Canales Shanin Szymanowski ACNP ?Acute Care Nurse Practitioner ?[facility] ?Please consult Amion ?01/20/2022, 9:55 AM ? ? ?  ?

## 2022-01-20 NOTE — Progress Notes (Signed)
?   01/20/22 1347  ?Provider Notification  ?Provider Name/Title A. Wynona Neat MD  ?Date Provider Notified 01/20/22  ?Time Provider Notified 1345  ?Method of Notification Face-to-face  ?Notification Reason Critical result  ?Test performed and critical result lactic acid ?(3.2)  ?Date Critical Result Received 01/20/22  ?Time Critical Result Received 1345  ?Provider response No new orders  ?Date of Provider Response 01/20/22  ?Time of Provider Response 1345  ? ? ?

## 2022-01-20 NOTE — ED Notes (Signed)
Pt's daughters at bedside leaving for home at this time. Prior to departure, pt's daughter confirmed that contact information is correct in pt's chart. This RN answered all questions from pt's daughters at bedside and informed them that staff will reach out in the event of needing to speak with family/for any large updates on pt.  ?

## 2022-01-20 NOTE — ED Notes (Signed)
Pt found by this RN laying sideways in her bed, as mentally altered and unreceptive to verbal instruction as previous. Blood pressure cuff and pulse ox noted to be removed by pt. This RN, Psychologist, prison and probation services, and Magazine features editor repositioned pt in her bed, changed her bed sheets and gown from urinary incontinence, and applied seizure pads to stretcher railings in effort to reduce chance of pt attempting to lay horizontally again. While staff repositioned pt, pt became very agitated and physically combative, but did not open eyes or speak to staff, and attempted to pull out her IV (wrapped in coban). Staff reminded pt that she is in the hospital and being repositioned in bed for her own care. Dr. Toniann Fail made aware of pt's ongoing agitation. Plan to give PRN IV ativan for agitation that impedes pt's care.  ?

## 2022-01-20 NOTE — Progress Notes (Signed)
Cindy Brooks is a 51 y.o. female with history of hypertension who has been noncompliant with her medications was found unresponsive in her car by Cindy Brooks.  ?She was admitted earlier this am by Dr Toniann Fail , see note for details. ?On my exam she was obtunded, with oral secretions and coughing while snoring. ?Grunting with sternal rub.  ?Suspect she will not be able to maintain airway, hence PCCM consulted for transfer to ICU.  ?Neurology on board , EEG is pending.  ?Will sign off.  ? ?Kathlen Mody, MD ? ?

## 2022-01-20 NOTE — H&P (Signed)
?History and Physical  ? ? ?[name] SHF:[id number] DOB: 1971-07-01 DOA: 01/20/2022 ? ?PCP: Pcp, No  ?Patient coming from: Home. ? ?History obtained from patient's daughter Ms. [name] Delfin. ? ?Chief Complaint: Altered mental status. ? ?HPI: Aradhana Gin is a 51 y.o. female with history of hypertension who has been noncompliant with her medications was found unresponsive in her car by patient's daughter yesterday morning.  As per the patient's daughter patient did drink some alcohol the night before.  Per daughter patient drinks alcohol at least 4-5 times a week.  When she tried to get her inside the house she was finding it difficult to walk so she has to carry her into the house.  And she was placed on the sofa.  When patient's daughter came to check on her last evening she was still on the sofa with minimal response.  She was brought to the ER.  Per daughter patient did not have any new medications or does not use any drugs.  Does drink alcohol.  Did not notice any fever or chills.  Patient has not complained of any chest pain or shortness of breath prior.  No recent travel. ? ?ED Course: In the ER patient was found to be encephalopathic confused at times getting agitated.  Blood pressure was found to be markedly elevated with 200 systolic urine shows concerning features for UTI.  Patient blood glucose was 57 was given D50 and started on D5 normal saline after giving thiamine by the ER physician.  At the time of my exam patient has already received Ativan and is minimally responsive.  CT head did not show anything acute.  Urine drug screen is negative.  Chest x-ray does show cardiomegaly.  Patient admitted for acute encephalopathy. ? ?Review of Systems: As per HPI, rest all negative. ? ? ?Past Medical History:  ?Diagnosis Date  ? Hypertension   ? TIA (transient ischemic attack) 2019  ? ? ?History reviewed. No pertinent surgical history. ? ? reports that she has never smoked. She has never used smokeless  tobacco. She reports current alcohol use. She reports that she does not use drugs. ? ?Not on File ? ?Family History  ?Problem Relation Age of Onset  ? Diabetes Mellitus II Maternal Grandmother   ? ? ?Prior to Admission medications   ?Not on File  ? ? ?Physical Exam: ?Constitutional: Moderately built and nourished. ?Vitals:  ? 01/20/22 0327 01/20/22 0345 01/20/22 0400 01/20/22 0414  ?BP: (!) 165/119 (!) 204/142 (!) 152/102 (!) 152/102  ?Pulse: (!) 105   (!) 103  ?Resp: 19 18 (!) 23 (!) 24  ?Temp:      ?TempSrc:      ?SpO2: 97%   95%  ? ?Eyes: Anicteric no pallor. ?ENMT: No discharge from the ears eyes nose and mouth. ?Neck: No mass felt.  No neck rigidity. ?Respiratory: No rhonchi or crepitations. ?Cardiovascular: S1-S2 heard. ?Abdomen: Soft nontender bowel sound present. ?Musculoskeletal: No edema. ?Skin: No rash. ?Neurologic: Patient is lethargic pupils are reacting to light does not follow commands. ?Psychiatric: Patient is lethargic. ? ? ?Labs on Admission: I have personally reviewed following labs and imaging studies ? ?CBC: ?Recent Labs  ?Lab 01/20/22 ?0128 01/20/22 ?0148 01/20/22 ?0149  ?WBC 7.8  --   --   ?NEUTROABS 5.0  --   --   ?HGB 13.4 13.6 13.9  ?HCT 40.9 40.0 41.0  ?MCV 82.8  --   --   ?PLT 231  --   --   ? ?Basic  Metabolic Panel: ?Recent Labs  ?Lab 01/20/22 ?0128 01/20/22 ?0148 01/20/22 ?0149  ?NA 138 139 138  ?K 4.0 4.0 4.0  ?CL 105  --  105  ?CO2 24  --   --   ?GLUCOSE 87  --  84  ?BUN 10  --  11  ?CREATININE 0.83  --  0.70  ?CALCIUM 9.4  --   --   ? ?GFR: ?CrCl cannot be calculated (Unknown ideal weight.). ?Liver Function Tests: ?Recent Labs  ?Lab 01/20/22 ?0128  ?AST 15  ?ALT 17  ?ALKPHOS 58  ?BILITOT 0.6  ?PROT 7.1  ?ALBUMIN 3.7  ? ?No results for input(s): LIPASE, AMYLASE in the last 168 hours. ?Recent Labs  ?Lab 01/20/22 ?0128  ?AMMONIA 28  ? ?Coagulation Profile: ?No results for input(s): INR, PROTIME in the last 168 hours. ?Cardiac Enzymes: ?No results for input(s): CKTOTAL, CKMB, CKMBINDEX,  TROPONINI in the last 168 hours. ?BNP (last 3 results) ?No results for input(s): PROBNP in the last 8760 hours. ?HbA1C: ?No results for input(s): HGBA1C in the last 72 hours. ?CBG: ?Recent Labs  ?Lab 01/20/22 ?0300  ?GLUCAP 57*  ? ?Lipid Profile: ?No results for input(s): CHOL, HDL, LDLCALC, TRIG, CHOLHDL, LDLDIRECT in the last 72 hours. ?Thyroid Function Tests: ?No results for input(s): TSH, T4TOTAL, FREET4, T3FREE, THYROIDAB in the last 72 hours. ?Anemia Panel: ?No results for input(s): VITAMINB12, FOLATE, FERRITIN, TIBC, IRON, RETICCTPCT in the last 72 hours. ?Urine analysis: ?   ?Component Value Date/Time  ? COLORURINE YELLOW 01/20/2022 0351  ? APPEARANCEUR CLEAR 01/20/2022 0351  ? LABSPEC 1.011 01/20/2022 0351  ? PHURINE 6.0 01/20/2022 0351  ? GLUCOSEU NEGATIVE 01/20/2022 0351  ? HGBUR SMALL (A) 01/20/2022 0351  ? BILIRUBINUR NEGATIVE 01/20/2022 0351  ? KETONESUR NEGATIVE 01/20/2022 0351  ? PROTEINUR NEGATIVE 01/20/2022 0351  ? NITRITE POSITIVE (A) 01/20/2022 0351  ? LEUKOCYTESUR SMALL (A) 01/20/2022 0351  ? ?Sepsis Labs: ?@LABRCNTIP (procalcitonin:4,lacticidven:4) ?)No results found for this or any previous visit (from the past 240 hour(s)).  ? ?Radiological Exams on Admission: ?CT HEAD WO CONTRAST ? ?Result Date: 01/20/2022 ?CLINICAL DATA:  Altered mental status EXAM: CT HEAD WITHOUT CONTRAST TECHNIQUE: Contiguous axial images were obtained from the base of the skull through the vertex without intravenous contrast. RADIATION DOSE REDUCTION: This exam was performed according to the departmental dose-optimization program which includes automated exposure control, adjustment of the mA and/or kV according to patient size and/or use of iterative reconstruction technique. COMPARISON:  None. FINDINGS: Brain: No evidence of acute infarction, hemorrhage, hydrocephalus, extra-axial collection or mass lesion/mass effect. Vascular: No hyperdense vessel or unexpected calcification. Skull: Normal. Negative for fracture or  focal lesion. Sinuses/Orbits: No acute finding. Other: None. IMPRESSION: No acute intracranial abnormality noted. Electronically Signed   By: 03/22/2022 M.D.   On: 01/20/2022 02:07  ? ?DG Chest Port 1 View ? ?Result Date: 01/20/2022 ?CLINICAL DATA:  Altered mental status, found unresponsive. EXAM: PORTABLE CHEST 1 VIEW COMPARISON:  PA Lat 05/01/2018. FINDINGS: The heart is moderately enlarged. Central vessels are normal caliber and no edema is seen. There is linear atelectasis or scarring peripherally in the left mid lung. The visualized lungs are otherwise clear with limited view of the right base due to an asymmetrically elevated right hemidiaphragm. The mediastinum is normally outlined. No pleural effusion is seen. No acute skeletal findings. Multiple overlying monitor wires. IMPRESSION: Cardiomegaly, may have increased since 2019 or could be exaggerated due to positioning and low inspiration. There are no findings suspect for acute CHF or  pneumonia. Electronically Signed   By: Almira BarKeith  Chesser M.D.   On: 01/20/2022 01:40   ? ?EKG: Independently reviewed.  Normal sinus rhythm. ? ?Assessment/Plan ?Principal Problem: ?  Acute encephalopathy ?Active Problems: ?  Hypertensive urgency ?  ? ?Acute encephalopathy cause not clear. -We will get MRI brain, thiamine levels, ammonia levels and get stat EEG.  Patient was hypoglycemic for which patient was given D50 and started on D5 normal saline after thiamine was given.  Keep patient on thiamine 500 mg IV every 8.  We will consult neurology.  Patient did have some agitation for which patient was given Ativan at the time before my exam.  We will closely monitor in progressive care.  Patient is presently afebrile.  Differentials include possible alcohol withdrawal.  Check CBGs. ?Hypertensive urgency presently on nitroglycerin infusion started by the ER physician. ?History of alcohol use -as per patient daughter patient drinks alcohol at least 5 times a week.  LFTs are normal at  this time.  Drug screen negative.  We will keep patient on CIWA protocol.  Thiamine.  Closely monitor. ?History of CHF chest x-ray does show cardiomegaly but no signs of any acute CHF.  Patient is receivin

## 2022-01-20 NOTE — ED Notes (Signed)
EEG at bedside.

## 2022-01-20 NOTE — ED Notes (Signed)
Pt was moving around in bed.Marland KitchenMarland Kitchenpurwick dislodged. RN cleaned pt and applied new sheets, diaper, and chuck  ?

## 2022-01-20 NOTE — Consult Note (Signed)
Neurology Consultation ? ?Reason for Consult: AMS ?Referring Physician: Dr. Karleen Hampshire ? ?CC: AMS ? ?History is obtained from:medical record ? ?HPI: Cindy Brooks is a 51 y.o. female with past medical history of HTN, TIA, alcohol abuse and medication noncompliance, who was found unresponsive in her car by family members on 4/8. Family members carried her into the house and placed her on the couch. Family returned on 4/9 @ 2200, found her in the same position unresponsive and called EMS. No family at the bedside. RN at bedside and states she has been receiving ativan for CIWA score and last dose was ~30 minutes ago. Per RN no seizure activity noticed, but patient will have burst of SVT in 180's. She is on nitro drip. RN states she called family and received consent to cut patients hair to apply EEG leads. Neurology consulted to further evaluate.  ? ?On further interview with family member by telephone, she endorses that the patient drinks "a lot". When asked to specify, the family member stated that the patient consumes about a 12 pack of beer per day in addition to a fifth of hard liquor on a daily basis.  ? ?LKW: 4/7 ?tpa given?: no, outside window ? ?ROS:  Unable to obtain due to altered mental status.  ? ?Past Medical History:  ?Diagnosis Date  ? Hypertension   ? TIA (transient ischemic attack) 2019  ? ? ?Family History  ?Problem Relation Age of Onset  ? Diabetes Mellitus II Maternal Grandmother   ? ? ? ?Social History:  ? reports that she has never smoked. She has never used smokeless tobacco. She reports current alcohol use. She reports that she does not use drugs. ? ?Medications ? ?Current Facility-Administered Medications:  ?  cefTRIAXone (ROCEPHIN) 2 g in sodium chloride 0.9 % 100 mL IVPB, 2 g, Intravenous, Q24H, Rise Patience, MD ?  dextrose 5 %-0.9 % sodium chloride infusion, , Intravenous, Continuous, Rise Patience, MD, Last Rate: 100 mL/hr at 01/20/22 0644, New Bag at 01/20/22 0644 ?  folic acid  (FOLVITE) tablet 1 mg, 1 mg, Oral, Daily, Rise Patience, MD ?  LORazepam (ATIVAN) injection 1-2 mg, 1-2 mg, Intravenous, Q6H PRN, Hosie Poisson, MD ?  LORazepam (ATIVAN) tablet 1-4 mg, 1-4 mg, Oral, Q1H PRN **OR** LORazepam (ATIVAN) injection 1-4 mg, 1-4 mg, Intravenous, Q1H PRN, Rise Patience, MD, 2 mg at 01/20/22 [id number] ?  multivitamin with minerals tablet 1 tablet, 1 tablet, Oral, Daily, Rise Patience, MD ?  nitroGLYCERIN 50 mg in dextrose 5 % 250 mL (0.2 mg/mL) infusion, 0-200 mcg/min, Intravenous, Titrated, Rise Patience, MD, Last Rate: 18 mL/hr at 01/20/22 0537, 60 mcg/min at 01/20/22 0537 ?  thiamine 500mg  in normal saline (53ml) IVPB, 500 mg, Intravenous, Q8H, Rise Patience, MD, Stopped at 01/20/22 [phone] ?No current outpatient medications on file. ?Precedex gtt ? ?Exam: ?Current vital signs: ?BP (!) 153/117   Pulse (!) 101   Temp (!) 97.2 ?F (36.2 ?C) (Temporal)   Resp (!) 23   LMP  (LMP Unknown)   SpO2 98%  ?Vital signs in last 24 hours: ?Temp:  [97.2 ?F (36.2 ?C)] 97.2 ?F (36.2 ?C) (04/09 0122) ?Pulse Rate:  [82-108] 101 (04/09 0915) ?Resp:  [15-26] 23 (04/09 0915) ?BP: (152-204)/(102-151) 153/117 (04/09 0915) ?SpO2:  [94 %-98 %] 98 % (04/09 0915) ? ?GENERAL: Critically ill  ?HEENT: - Normocephalic and atraumatic, dry mm ?LUNGS - coarse bilaterally  ?CV - S1S2 RRR, no m/r/g, equal pulses bilaterally. ?ABDOMEN -  Soft, nontender, nondistended with normoactive BS ?Ext: warm, well perfused, intact peripheral pulses, no edema ? ?NEURO:  ?Mental Status: Sedated, lethargic with snoring respirations (recently received ativan) on [place]. Does not follow commands, answer questions or respond to voice. Nonverbal but will murmur in discomfort at times. Easily agitated during exam. Will reach for head and grimace while EEG leads are being place.  ?CN: No blink to threat. PERRL. Eyes are conjugate. Reacts to touch and irritative stimuli. Face appears symmetric when furrowing brow and  with weak grimacing while EEG leads are being placed. Cough and gag intact.  ?Motor: Moves all extremities equally and antigravity semipurposefully when agitated.   ?Sensation- Withdraws to pain in all 4 extremities ?Coordination: Unable to assess due to lethargy ?Gait- Unable to assess ? ?Labs ?WBC 7.8 ?Ammonia 49 ?Lactic acid 1.2 ?ETOH <10 ?UA positive nitrates small amount Leuks ?UDS negative  ?AST 20 ?ALT 19 ?Vit B1 pending  ?CXR 4/9: Cardiomegaly. No acute findings ? ?Imaging ?I have reviewed the images obtained: ? ?CT-head 4/9: ?No acute process ? ?Assessment: Cindy Brooks is a 51 y.o. female with past medical history of HTN, TIA, alcohol abuse and medication noncompliance who was found unresponsive in car by family members on 4/8. Family members carried her into the house and placed her on the couch. Family returned on 4/9 @ 2200, found her in the same position unresponsive and called EMS. She has a history of high alcohol intake with last known drink on the day she was last seen by family to be responsive. Blood alcohol level on arrival was < 10.  ?- Exam reveals an obtunded patient who is able to move all 4 extremities semipurposefully to noxious and non-noxious stimuli.   ?- CT head: No acute intracranial abnormality noted. ?- Ammonia elevated at 49. May be a contributing factor but not high enough to fully explain the patient's obtundation.  ?- UTI. Unlikely on its own to fully explain the patient's severe encephalopathy ?- UTOX negative. AST and ALT are normal. Direct bilirubin elevated at 0.3. Lactate elevated at 3.3 currently, but was normal on arrival. WBC normal.  ?- Has a mildly elevated temp of 97.2 but no neck stiffness or white count. No preceding febrile illness endorsed by family. Meningitis is felt to be unlikely.  ?- DDx: Acute encephalopathy due to toxic/metabollic/infectious etiology versus severe delirium tremens versus alcohol withdrawal seizures. Stroke is possible but felt to be  significantly less likely based upon overall clinical features.  ? ?Recommendations: ?- STAT EEG to r/o seizures ?- Check MRI Brain (already ordered) ?- CIWA protocol  ?- Thiamine 500 mg IV Q8h x 3 days then 100 mg daily  ?- Treat UTI per primary team  ?- CCM has started Precedex. ? ?Beulah Gandy DNP, ACNPC-AG  ? ?Addendum:  ?- STAT EEG was within normal limits, with excessive generalized beta activity most likely secondary to the effect of benzodiazepine.  ?- Subsequently with stereotyped episodes of stiffening with elevated HR. LTM EEG ordered STAT and Keppra 2000 mg loaded, followed by scheduled dosing at 1000 mg IV BID.  ? ? ?I have seen and examined the patient. I have formulated the assessment and recommendations. 51 year old female presenting to the hospital after being found unresponsive by family. Family member stated that the patient consumes about a 12 pack of beer per day in addition to a fifth of hard liquor on a daily basis. Exam reveals an obtunded patient who is able to move all 4 extremities  semipurposefully to noxious and non-noxious stimuli. DDx includes acute encephalopathy due to toxic/metabollic/infectious etiology versus severe delirium tremens versus alcohol withdrawal seizures. Recommendations as above.  ?Electronically signed: Dr. Kerney Elbe ? ? ?

## 2022-01-20 NOTE — ED Notes (Signed)
This RN and [place], [place] and [place] attempted to undress pt for ordered in & out cath. Pt became very combative against staff, not opening eyes or speaking but physically combative. ED provider made aware. Plan to obtain head CT prior to any possible sedation medications for in & out cath ?

## 2022-01-20 NOTE — Progress Notes (Signed)
OG tube placed uneventfully 

## 2022-01-20 NOTE — ED Triage Notes (Signed)
Pt bib EMS from home for unresponsiveness. LKW 4/7 around 1500. Initially found by family at 0800 4/7 with pt outside. Moved into her home by family. At 2200 4/7, family found pt unresponsive in same location/position, called EMS at 2345 after no change in mentation. Pt unconscious, responsive to pain with snoring respirations. Hx TIA and uncontrolled HTN.  ? ?BP 234/108 ?HR 86 ?94% room air, 98% 3L nasal cannula ?RR 16-18/min ?CBG 112 ?

## 2022-01-20 NOTE — ED Notes (Signed)
RN reached out to Dr for concerns that pt needs more than a progressive bed and that pt looks unwell.  ?

## 2022-01-20 NOTE — Progress Notes (Signed)
LTM placed, no initial skin breakdown atrium monitoring test button completee ?

## 2022-01-20 NOTE — Procedures (Addendum)
Patient Name: Cindy Brooks  ?MRN: [id number]  ?Epilepsy Attending: Charlsie Quest  ?Referring Physician/Provider: Caryl Pina, MD ?Date: 01/20/2022 ?Duration: 21.53 mins ? ?Patient history: 51yo F with altered mental status in the setting of suspected substance abuse, alcohol abuse and reported seizure activity. EEG to evaluate for seizure ? ?Level of alertness: lethargic ? ?AEDs during EEG study: Ativan ? ?Technical aspects: This EEG study was done with scalp electrodes positioned according to the 10-20 International system of electrode placement. Electrical activity was acquired at a sampling rate of 500Hz  and reviewed with a high frequency filter of 70Hz  and a low frequency filter of 1Hz . EEG data were recorded continuously and digitally stored.  ? ?Description: No clear posterior dominant rhythm was seen. There is an excessive amount of 15 to 18 Hz beta activity distributed symmetrically and diffusely. Hyperventilation and photic stimulation were not performed.    ? ?ABNORMALITY ?- Excessive beta, generalized ? ?IMPRESSION: ?This study is within normal limits. The excessive beta activity seen in the background is most likely due to the effect of benzodiazepine and is a benign EEG pattern. No seizures or epileptiform discharges were seen throughout the recording. ? ?  ? ?

## 2022-01-20 NOTE — ED Notes (Addendum)
Patient transported to CT by this RN on full vital sign monitor ?

## 2022-01-20 NOTE — Progress Notes (Signed)
Patient was successfully intubated ? ?Agitation management with fentanyl and Precedex ? ?Will continue to monitor ?

## 2022-01-20 NOTE — Procedures (Signed)
Intubation Procedure Note ? ?Cindy Brooks  ?[id number]  ?[birth year] ? ?Date:01/20/22  ?Time:1:40 PM  ? ?Provider Performing:Varshini Arrants A Brandii Lakey  ? ? ?Procedure: Intubation (31500) ? ?Indication(s) ?Respiratory Failure ? ?Consent ?Unable to obtain consent due to emergent nature of procedure. ? ? ?Anesthesia ?Etomidate, Versed, Fentanyl, and Rocuronium ? ? ?Time Out ?Verified patient identification, verified procedure, site/side was marked, verified correct patient position, special equipment/implants available, medications/allergies/relevant history reviewed, required imaging and test results available. ? ? ?Sterile Technique ?Usual hand hygeine, masks, and gloves were used ? ? ?Procedure Description ?Patient positioned in bed supine.  Sedation given as noted above.  Patient was intubated with endotracheal tube using Glidescope.  View was Grade 1 full glottis .  Number of attempts was 1.  Colorimetric CO2 detector was consistent with tracheal placement. ? ? ?Complications/Tolerance ?None; patient tolerated the procedure well. ?Chest X-ray is ordered to verify placement. ? ? ?EBL ?None ? ? ?Specimen(s) ?None ? ?

## 2022-01-20 NOTE — Progress Notes (Signed)
Significant alcohol use history ? ?Start phenobarbital and tapering dose per protocol ?

## 2022-01-20 NOTE — ED Notes (Signed)
RN got consent from family to cut pts hair  ?

## 2022-01-20 NOTE — ED Notes (Signed)
Informed RN of monitor alerts ?

## 2022-01-20 NOTE — ED Notes (Signed)
Family at pt bedside. Plan of care discussed with family, all questions answered. ?

## 2022-01-20 NOTE — ED Provider Notes (Signed)
?[facility] ?Provider Note ? ?CSN: [id number] ?Arrival date & time: 01/20/22 0106 ? ?Chief Complaint(s) ?Altered Mental Status ? ?HPI ?Cindy Brooks is a 51 y.o. female found unresponsive by family around 85 PM this evening.  Last seen normal 3 PM on April 7.  Patient was reportedly found outside of the home in the morning on April 8.  She was taken inside and placed on the couch.  When family returned this evening at 10 PM she was still in the same position.  When she did not improve, family called EMS around 11:30 PM.  Patient is known alcoholic.  No known trauma.  Patient is responsive to painful stimuli.  Remained hemodynamically stable in route. ? ?The history is provided by the EMS personnel and a relative.  ? ?Past Medical History ?Past Medical History:  ?Diagnosis Date  ? Hypertension   ? TIA (transient ischemic attack) 2019  ? ?Patient Active Problem List  ? Diagnosis Date Noted  ? Acute encephalopathy 01/20/2022  ? Hypertensive urgency 01/20/2022  ? ?Home Medication(s) ?Prior to Admission medications   ?Not on File  ?                                                                                                                                  ?Allergies ?Patient has no allergy information on record. ? ?Review of Systems ?Review of Systems ?As noted in HPI ? ?Physical Exam ?Vital Signs  ?I have reviewed the triage vital signs ?BP (!) 176/117   Pulse 92   Temp (!) 97.2 ?F (36.2 ?C) (Temporal)   Resp (!) 21   LMP  (LMP Unknown)   SpO2 95%  ? ?Physical Exam ?Vitals reviewed.  ?Constitutional:   ?   General: She is not in acute distress. ?   Appearance: She is well-developed. She is not diaphoretic.  ?HENT:  ?   Head: Normocephalic and atraumatic.  ?   Nose: Nose normal.  ?Eyes:  ?   General: No scleral icterus.    ?   Right eye: No discharge.     ?   Left eye: No discharge.  ?   Conjunctiva/sclera: Conjunctivae normal.  ?   Pupils: Pupils are equal, round, and  reactive to light.  ?Cardiovascular:  ?   Rate and Rhythm: Normal rate and regular rhythm.  ?   Heart sounds: No murmur heard. ?  No friction rub. No gallop.  ?Pulmonary:  ?   Effort: Pulmonary effort is normal. No respiratory distress.  ?   Breath sounds: Normal breath sounds. No stridor. No rales.  ?Abdominal:  ?   General: There is no distension.  ?   Palpations: Abdomen is soft.  ?   Tenderness: There is no abdominal tenderness.  ?Musculoskeletal:     ?   General: No tenderness.  ?   Cervical back: Normal range of motion and neck supple.  ?Skin: ?  General: Skin is warm and dry.  ?   Findings: No erythema or rash.  ?Neurological:  ?   Mental Status: She is lethargic.  ?   GCS: GCS eye subscore is 3. GCS verbal subscore is 4. GCS motor subscore is 5.  ? ? ?ED Results and Treatments ?Labs ?(all labs ordered are listed, but only abnormal results are displayed) ?Labs Reviewed  ?URINALYSIS, ROUTINE W REFLEX MICROSCOPIC - Abnormal; Notable for the following components:  ?    Result Value  ? Hgb urine dipstick SMALL (*)   ? Nitrite POSITIVE (*)   ? Leukocytes,Ua SMALL (*)   ? Bacteria, UA MANY (*)   ? All other components within normal limits  ?CBG MONITORING, ED - Abnormal; Notable for the following components:  ? Glucose-Capillary 57 (*)   ? All other components within normal limits  ?I-STAT VENOUS BLOOD GAS, ED - Abnormal; Notable for the following components:  ? pCO2, Ven 42.2 (*)   ? pO2, Ven 54 (*)   ? All other components within normal limits  ?COMPREHENSIVE METABOLIC PANEL  ?CBC WITH DIFFERENTIAL/PLATELET  ?AMMONIA  ?LACTIC ACID, PLASMA  ?RAPID URINE DRUG SCREEN, HOSP PERFORMED  ?ETHANOL  ?HIV ANTIBODY (ROUTINE TESTING W REFLEX)  ?HEPATIC FUNCTION PANEL  ?BASIC METABOLIC PANEL  ?CBC WITH DIFFERENTIAL/PLATELET  ?AMMONIA  ?VITAMIN B1  ?I-STAT CHEM 8, ED  ?CBG MONITORING, ED  ?                                                                                                                       ?EKG ? EKG  Interpretation ? ?Date/Time:  Sunday January 20 2022 01:43:07 EDT ?Ventricular Rate:  92 ?PR Interval:  173 ?QRS Duration: 82 ?QT Interval:  379 ?QTC Calculation: 469 ?R Axis:   -75 ?Text Interpretation: Sinus rhythm Biatrial enlargement Left anterior fascicular block Consider anterior infarct No significant change since last tracing Confirmed by Addison Lank [phone]) on 01/20/2022 2:04:08 AM ?  ? ?  ? ?Radiology ?CT HEAD WO CONTRAST ? ?Result Date: 01/20/2022 ?CLINICAL DATA:  Altered mental status EXAM: CT HEAD WITHOUT CONTRAST TECHNIQUE: Contiguous axial images were obtained from the base of the skull through the vertex without intravenous contrast. RADIATION DOSE REDUCTION: This exam was performed according to the departmental dose-optimization program which includes automated exposure control, adjustment of the mA and/or kV according to patient size and/or use of iterative reconstruction technique. COMPARISON:  None. FINDINGS: Brain: No evidence of acute infarction, hemorrhage, hydrocephalus, extra-axial collection or mass lesion/mass effect. Vascular: No hyperdense vessel or unexpected calcification. Skull: Normal. Negative for fracture or focal lesion. Sinuses/Orbits: No acute finding. Other: None. IMPRESSION: No acute intracranial abnormality noted. Electronically Signed   By: Inez Catalina M.D.   On: 01/20/2022 02:07  ? ?DG Chest Port 1 View ? ?Result Date: 01/20/2022 ?CLINICAL DATA:  Altered mental status, found unresponsive. EXAM: PORTABLE CHEST 1 VIEW COMPARISON:  PA Lat 05/01/2018. FINDINGS: The heart is moderately enlarged. Central vessels are normal caliber  and no edema is seen. There is linear atelectasis or scarring peripherally in the left mid lung. The visualized lungs are otherwise clear with limited view of the right base due to an asymmetrically elevated right hemidiaphragm. The mediastinum is normally outlined. No pleural effusion is seen. No acute skeletal findings. Multiple overlying monitor wires.  IMPRESSION: Cardiomegaly, may have increased since 2019 or could be exaggerated due to positioning and low inspiration. There are no findings suspect for acute CHF or pneumonia. Electronically Signed   By: Telford Nab M.D.   On: 01/20/2022 01:40   ? ?Pertinent labs & imaging results that were available during my care of the patient were reviewed by me and considered in my medical decision making (see MDM for details). ? ?Medications Ordered in ED ?Medications  ?nitroGLYCERIN 50 mg in dextrose 5 % 250 mL (0.2 mg/mL) infusion (60 mcg/min Intravenous Rate/Dose Change 01/20/22 0537)  ?cefTRIAXone (ROCEPHIN) 2 g in sodium chloride 0.9 % 100 mL IVPB (has no administration in time range)  ?LORazepam (ATIVAN) tablet 1-4 mg ( Oral See Alternative 01/20/22 0649)  ?  Or  ?LORazepam (ATIVAN) injection 1-4 mg (2 mg Intravenous Given 01/20/22 0649)  ?folic acid (FOLVITE) tablet 1 mg (has no administration in time range)  ?multivitamin with minerals tablet 1 tablet (has no administration in time range)  ?dextrose 5 %-0.9 % sodium chloride infusion ( Intravenous New Bag/Given 01/20/22 0644)  ?thiamine 500mg  in normal saline (99ml) IVPB (has no administration in time range)  ?sodium chloride 0.9 % bolus 1,000 mL (0 mLs Intravenous Stopped 01/20/22 0251)  ?dextrose 50 % solution 50 mL (50 mLs Intravenous Given 01/20/22 0313)  ?thiamine (B-1) injection 100 mg (100 mg Intravenous Given 01/20/22 0312)  ?LORazepam (ATIVAN) injection 2 mg (2 mg Intravenous Given 01/20/22 0320)  ?cefTRIAXone (ROCEPHIN) 2 g in sodium chloride 0.9 % 100 mL IVPB (0 g Intravenous Stopped 01/20/22 0537)  ?                                                               ?                                                                    ?Procedures ?Marland KitchenCritical Care ?Performed by: Fatima Blank, MD ?Authorized by: Fatima Blank, MD  ? ?Critical care provider statement:  ?  Critical care time (minutes):  80 ?  Critical care time was exclusive of:  Separately  billable procedures and treating other patients ?  Critical care was necessary to treat or prevent imminent or life-threatening deterioration of the following conditions:  CNS failure or compromise and circulatory failure ?

## 2022-01-20 NOTE — Progress Notes (Signed)
EEG complete, pending approval ?

## 2022-01-20 NOTE — ED Notes (Signed)
Pt seen increasing in HR from NSR to tachy up to 156 bpm. New EKG obtained, given to ED provider, and provider told of new tachycardia. ?

## 2022-01-20 NOTE — Progress Notes (Addendum)
?   01/20/22 1439  ?Provider Notification  ?Provider Name/Title Agnes Lawrence MD  ?Date Provider Notified 01/20/22  ?Time Provider Notified 1430  ?Method of Notification Page  ?Notification Reason Change in status ?(HR elevated, possible seizure activity)  ?Provider response See new orders ?(give ativan now; MD to bedside, keppra,)  ?Date of Provider Response 01/20/22  ?Time of Provider Response 1440  ? ?  01/20/22 1440  ?Seizure Activity  ?Psychomotor Symptoms Incontinence ?(BLE tremor)  ?Motor Component Right;Left;Leg;Twitching  ?Duration multiple episodes last about 5 seconds and 10-30 seconds apart  ?Interventions Medication;Notified MD/Provider  ?Tasking (Response) Unable to respond  ?  ?   ?  ?  ?  ? ? ? ? 01/20/22 1645  ?Provider Notification  ?Provider Name/Title A. Wynona Neat MD  ?Date Provider Notified 01/20/22  ?Time Provider Notified 1645  ?Method of Notification Page  ?Notification Reason Other (Comment) ?(8 beat run vtach)  ?Provider response No new orders  ?Date of Provider Response 01/20/22  ?Time of Provider Response 1645  ? ? ?While updating family over phone obtained information that when patient drinks she consumes: 12pk beer/day and fifth liquor/day. MD Otelia Limes and MD Ol[place]re aware ?

## 2022-01-20 NOTE — ED Notes (Signed)
MD at bedside. 

## 2022-01-21 LAB — POCT I-STAT 7, (LYTES, BLD GAS, ICA,H+H)
Acid-base deficit: 2 mmol/L (ref 0.0–2.0)
Bicarbonate: 22.3 mmol/L (ref 20.0–28.0)
Calcium, Ion: 1.28 mmol/L (ref 1.15–1.40)
HCT: 29 % — ABNORMAL LOW (ref 36.0–46.0)
Hemoglobin: 9.9 g/dL — ABNORMAL LOW (ref 12.0–15.0)
O2 Saturation: 97 %
Patient temperature: 95.9
Potassium: 3.7 mmol/L (ref 3.5–5.1)
Sodium: 143 mmol/L (ref 135–145)
TCO2: 23 mmol/L (ref 22–32)
pCO2 arterial: 35 mmHg (ref 32–48)
pH, Arterial: 7.406 (ref 7.35–7.45)
pO2, Arterial: 84 mmHg (ref 83–108)

## 2022-01-21 LAB — GLUCOSE, CAPILLARY
Glucose-Capillary: 140 mg/dL — ABNORMAL HIGH (ref 70–99)
Glucose-Capillary: 140 mg/dL — ABNORMAL HIGH (ref 70–99)
Glucose-Capillary: 111 mg/dL — ABNORMAL HIGH (ref 70–99)
Glucose-Capillary: 57 mg/dL — ABNORMAL LOW (ref 70–99)
Glucose-Capillary: 88 mg/dL (ref 70–99)
Glucose-Capillary: 85 mg/dL (ref 70–99)
Glucose-Capillary: 71 mg/dL (ref 70–99)
Glucose-Capillary: 139 mg/dL — ABNORMAL HIGH (ref 70–99)

## 2022-01-21 LAB — MAGNESIUM
Magnesium: 1.8 mg/dL (ref 1.7–2.4)
Magnesium: 1.8 mg/dL (ref 1.7–2.4)

## 2022-01-21 LAB — BASIC METABOLIC PANEL
Anion gap: 6 (ref 5–15)
BUN: 6 mg/dL (ref 6–20)
CO2: 22 mmol/L (ref 22–32)
Calcium: 8.4 mg/dL — ABNORMAL LOW (ref 8.9–10.3)
Chloride: 114 mmol/L — ABNORMAL HIGH (ref 98–111)
Creatinine, Ser: 0.65 mg/dL (ref 0.44–1.00)
GFR, Estimated: >60 mL/min (ref >60)
Glucose, Bld: 149 mg/dL — ABNORMAL HIGH (ref 70–99)
Potassium: 3.8 mmol/L (ref 3.5–5.1)
Sodium: 142 mmol/L (ref 135–145)

## 2022-01-21 LAB — CBC
HCT: 30.3 % — ABNORMAL LOW (ref 36.0–46.0)
Hemoglobin: 10.4 g/dL — ABNORMAL LOW (ref 12.0–15.0)
MCH: 27.7 pg (ref 26.0–34.0)
MCHC: 34.3 g/dL (ref 30.0–36.0)
MCV: 80.8 fL (ref 80.0–100.0)
Platelets: 178 10*3/uL (ref 150–400)
RBC: 3.75 MIL/uL — ABNORMAL LOW (ref 3.87–5.11)
RDW: 15.3 % (ref 11.5–15.5)
WBC: 5.7 10*3/uL (ref 4.0–10.5)
nRBC: 0 % (ref 0.0–0.2)

## 2022-01-21 LAB — URINE CULTURE: Culture: <10000 — AB

## 2022-01-21 LAB — PHOSPHORUS
Phosphorus: 2.9 mg/dL (ref 2.5–4.6)
Phosphorus: 3.3 mg/dL (ref 2.5–4.6)

## 2022-01-21 LAB — C-REACTIVE PROTEIN: CRP: 7.6 mg/dL — ABNORMAL HIGH (ref <1.0)

## 2022-01-21 LAB — VITAMIN B12: Vitamin B-12: 930 pg/mL — ABNORMAL HIGH (ref 180–914)

## 2022-01-21 MED ORDER — FENTANYL BOLUS VIA INFUSION
50.0000 ug | INTRAVENOUS | Status: DC | PRN
  Administered 2022-01-21: 50 ug via INTRAVENOUS
  Administered 2022-01-22: 100 ug via INTRAVENOUS
  Administered 2022-01-22: 50 ug via INTRAVENOUS
  Filled 2022-01-21: qty 100

## 2022-01-21 MED ORDER — DOCUSATE SODIUM 50 MG/5ML PO LIQD: 100.0000 mg | Freq: Two times a day (BID) | ORAL | Status: DC | PRN

## 2022-01-21 MED ORDER — DEXTROSE 50 % IV SOLN
INTRAVENOUS | Status: AC
  Filled 2022-01-21: qty 50

## 2022-01-21 MED ORDER — DOCUSATE SODIUM 50 MG/5ML PO LIQD
100.0000 mg | Freq: Two times a day (BID) | ORAL | Status: DC
  Administered 2022-01-21 – 2022-03-17 (×97): 100 mg
  Filled 2022-01-21 (×97): qty 10

## 2022-01-21 MED ORDER — POLYETHYLENE GLYCOL 3350 17 G PO PACK
17.0000 g | PACK | Freq: Every day | ORAL | Status: DC | PRN
Start: 2022-01-21 — End: 2022-03-18

## 2022-01-21 MED ORDER — VITAL 1.5 CAL PO LIQD
1000.0000 mL | ORAL | Status: AC
  Administered 2022-01-21 – 2022-02-07 (×16): 1000 mL
  Filled 2022-01-21: qty 1000

## 2022-01-21 MED ORDER — FOLIC ACID 1 MG PO TABS
1.0000 mg | ORAL_TABLET | Freq: Every day | ORAL | Status: DC
Start: 2022-01-21 — End: 2022-03-18
  Administered 2022-01-21 – 2022-03-17 (×57): 1 mg
  Filled 2022-01-21 (×56): qty 1

## 2022-01-21 MED ORDER — PANTOPRAZOLE 2 MG/ML SUSPENSION
40.0000 mg | Freq: Every day | ORAL | Status: DC
  Administered 2022-01-21 – 2022-02-09 (×20): 40 mg
  Filled 2022-01-21 (×20): qty 20

## 2022-01-21 MED ORDER — ACETAMINOPHEN 160 MG/5ML PO SOLN
650.0000 mg | Freq: Four times a day (QID) | ORAL | Status: DC | PRN
  Administered 2022-01-24 – 2022-03-16 (×27): 650 mg
  Filled 2022-01-21 (×32): qty 20.3

## 2022-01-21 MED ORDER — FENTANYL 2500MCG IN NS 250ML (10MCG/ML) PREMIX INFUSION
50.0000 ug/h | INTRAVENOUS | Status: DC
  Administered 2022-01-21: 50 ug/h via INTRAVENOUS
  Administered 2022-01-22: 25 ug/h via INTRAVENOUS
  Filled 2022-01-21: qty 250

## 2022-01-21 MED ORDER — PROSOURCE TF PO LIQD
45.0000 mL | Freq: Every day | ORAL | Status: DC
  Administered 2022-01-22 – 2022-03-05 (×42): 45 mL
  Filled 2022-01-21 (×39): qty 45

## 2022-01-21 MED ORDER — ADULT MULTIVITAMIN W/MINERALS CH
1.0000 | ORAL_TABLET | Freq: Every day | ORAL | Status: DC
Start: 2022-01-21 — End: 2022-03-18
  Administered 2022-01-21 – 2022-03-17 (×56): 1
  Filled 2022-01-21 (×56): qty 1

## 2022-01-21 MED ORDER — THIAMINE HCL 100 MG PO TABS: 100.0000 mg | ORAL_TABLET | Freq: Every day | ORAL | Status: DC

## 2022-01-21 MED ORDER — DEXTROSE 50 % IV SOLN
1.0000 | Freq: Once | INTRAVENOUS | Status: AC
  Administered 2022-01-21: 50 mL via INTRAVENOUS

## 2022-01-21 MED ORDER — POLYETHYLENE GLYCOL 3350 17 G PO PACK
17.0000 g | PACK | Freq: Every day | ORAL | Status: DC
  Administered 2022-01-21 – 2022-02-05 (×12): 17 g
  Filled 2022-01-21 (×12): qty 1

## 2022-01-21 NOTE — Progress Notes (Signed)
? ?NAME:  Cindy Brooks, MRN:  [id number], DOB:  09/24/1971, LOS: 1 ?ADMISSION DATE:  01/20/2022, CONSULTATION DATE: 01/20/2022 ?REFERRING MD: Triad, CHIEF COMPLAINT: Altered mental status ? ?History of Present Illness:  ?51 year old female who was found outside by family and was moved to the house and left for 5 hours at which time she did not move questional she had seizures therefore emergency medical services was activated.  She is transferred to [facility] emergency department near obtunded has extremely loud respirations copious secretions and difficulty controlling her airway.  She has been seen by neurology.  She admitted to the intensive care unit for further evaluation and treatment. ? ?Pertinent  Medical History  ? ?Past Medical History:  ?Diagnosis Date  ? Hypertension   ? TIA (transient ischemic attack) 2019  ? ? ? ?Significant Hospital Events: ?Including procedures, antibiotic start and stop dates in addition to other pertinent events   ? ? ?Interim History / Subjective:  ?Intubated last night. This morning sedated on the vent.  ? ?Objective   ?Blood pressure (!) 133/91, pulse 70, temperature (!) 96.6 ?F (35.9 ?C), temperature source Axillary, resp. rate 18, height 5\' 4"  (1.626 m), weight 80.9 kg, SpO2 100 %. ?   ?Vent Mode: PRVC ?FiO2 (%):  [30 %-40 %] 30 % ?Set Rate:  [18 bmp] 18 bmp ?Vt Set:  [440 mL] 440 mL ?PEEP:  [5 cmH20] 5 cmH20 ?Plateau Pressure:  [17 cmH20-20 cmH20] 20 cmH20  ? ?Intake/Output Summary (Last 24 hours) at 01/21/2022 0941 ?Last data filed at 01/21/2022 0800 ?Gross per 24 hour  ?Intake 5064.38 ml  ?Output 1900 ml  ?Net 3164.38 ml  ? ?Filed Weights  ? 01/20/22 1140 01/21/22 0500  ?Weight: 78.7 kg 80.9 kg  ? ? ?Examination: ?Gen:      Intubated, sedated, acutely ill appearing ?HEENT:  ETT to vent ?Lungs:    sounds of mechanical ventilation auscultated no wheezes or crackles ?CV:         RRR no mrg ?Abd:      + bowel sounds; soft, non-tender; no palpable masses, no distension ?Ext:    No  edema ?Skin:      Warm and dry; no rashes ?Neuro:   sedated, RASS -4, moves all 4 extremities ? ? ?Resolved Hospital Problem list   ? ? ?Assessment & Plan:  ?Acute metabolic encephalopathy ?in the setting of suspected substance abuse ?Alcohol abuse and reported seizure activity ?Neurology is involved ?Phenobarbital for etoh withdrawal ?Precedex with prn fentanyl for sedation ?Thiamine ?Folic acid ?Keppra and AEDs per neurology ?EEG negative for seizures so far.  ? ?UTI ?Ceftriaxone. ?Blood and urine cx pending ? ?Hypertension ?Antihypertensives as needed ? ?Hypokalemia ?- replace and repeat ? ?Anemia ?Baseline Hgb 13, down to 10 ?No evidence of blood loss ?Possibly secondary to acute illness ? ?Best Practice (right click and "Reselect all SmartList Selections" daily)  ? ?Diet/type: NPO with tube feeds ?DVT prophylaxis: LMWH ?GI prophylaxis: PPI ?Lines: N/A ?Foley:  N/A ?Code Status:  full code ?Last date of multidisciplinary goals of care discussion [tbd. Will update daughter today.] ? ?Labs   ?CBC: ?Recent Labs  ?Lab 01/20/22 ?0128 01/20/22 ?0148 01/20/22 ?1039 01/20/22 ?1219 01/20/22 ?1536 01/21/22 ?0354 01/21/22 ?0552  ?WBC 7.8  --   --  9.6  --   --  5.7  ?NEUTROABS 5.0  --   --  7.6  --   --   --   ?HGB 13.4   < > 11.2* 10.7* 10.2* 9.9* 10.4*  ?  HCT 40.9   < > 33.0* 32.8* 30.0* 29.0* 30.3*  ?MCV 82.8  --   --  83.0  --   --  80.8  ?PLT 231  --   --  213  --   --  178  ? < > = values in this interval not displayed.  ? ? ?Basic Metabolic Panel: ?Recent Labs  ?Lab 01/20/22 ?0128 01/20/22 ?0148 01/20/22 ?0149 01/20/22 ?0700 01/20/22 ?1039 01/20/22 ?1219 01/20/22 ?1536 01/20/22 ?1553 01/21/22 ?0354 01/21/22 ?0552  ?NA 138   < > 138 138 138  --  141  --  143 142  ?K 4.0   < > 4.0 4.4 3.6  --  3.4* 3.7 3.7 3.8  ?CL 105  --  105 109  --   --   --   --   --  114*  ?CO2 24  --   --  23  --   --   --   --   --  22  ?GLUCOSE 87  --  84 113*  --   --   --   --   --  149*  ?BUN 10  --  11 10  --   --   --   --   --  6   ?CREATININE 0.83  --  0.70 0.72  --  0.76  --   --   --  0.65  ?CALCIUM 9.4  --   --  8.2*  --   --   --   --   --  8.4*  ?MG  --   --   --   --   --   --   --  1.8  --  1.8  ?PHOS  --   --   --   --   --   --   --  3.4  --  2.9  ? < > = values in this interval not displayed.  ? ?GFR: ?Estimated Creatinine Clearance: 86.6 mL/min (by C-G formula based on SCr of 0.65 mg/dL). ?Recent Labs  ?Lab 01/20/22 ?0128 01/20/22 ?1219 01/20/22 ?1549 01/21/22 ?0552  ?PROCALCITON  --  <0.10  --   --   ?WBC 7.8 9.6  --  5.7  ?LATICACIDVEN 1.2 3.2* 3.3*  --   ? ? ?Liver Function Tests: ?Recent Labs  ?Lab 01/20/22 ?0128 01/20/22 ?0700  ?AST 15 20  ?ALT 17 19  ?ALKPHOS 58 46  ?BILITOT 0.6 0.7  ?PROT 7.1 6.2*  ?ALBUMIN 3.7 3.0*  ? ?No results for input(s): LIPASE, AMYLASE in the last 168 hours. ?Recent Labs  ?Lab 01/20/22 ?0128 01/20/22 ?0700  ?AMMONIA 28 49*  ? ? ?ABG ?   ?Component Value Date/Time  ? PHART 7.406 01/21/2022 0354  ? PCO2ART 35.0 01/21/2022 0354  ? PO2ART 84 01/21/2022 0354  ? HCO3 22.3 01/21/2022 0354  ? TCO2 23 01/21/2022 0354  ? ACIDBASEDEF 2.0 01/21/2022 0354  ? O2SAT 97 01/21/2022 0354  ?  ? ?Coagulation Profile: ?Recent Labs  ?Lab 01/20/22 ?1219  ?INR 1.2  ? ? ?Cardiac Enzymes: ?No results for input(s): CKTOTAL, CKMB, CKMBINDEX, TROPONINI in the last 168 hours. ? ?HbA1C: ?No results found for: HGBA1C ? ?CBG: ?Recent Labs  ?Lab 01/20/22 ?1617 01/20/22 ?1958 01/20/22 ?2336 01/21/22 ?[id number] 01/21/22 ?[id number]  ?GLUCAP 111* 117* 121* 140* 140*  ? ? ? ?Critical care time:  ?  ? ?The patient is critically ill due to respiratory failure, encephalopathy.  Critical care was necessary to treat or  prevent imminent or life-threatening deterioration.  Critical care was time spent personally by me on the following activities: development of treatment plan with patient and/or surrogate as well as nursing, discussions with consultants, evaluation of patient's response to treatment, examination of patient, obtaining history from patient  or surrogate, ordering and performing treatments and interventions, ordering and review of laboratory studies, ordering and review of radiographic studies, pulse oximetry, re-evaluation of patient's condition and participation in multidisciplinary rounds.  ? ?Critical Care Time devoted to patient care services described in this note is 40 minutes. This time reflects time of care of this [place] . This critical care time does not reflect separately billable procedures or procedure time, teaching time or supervisory time of PA/NP/Med student/Med Resident etc but could involve care discussion time.   ?   ? ?Spero Geralds ?[facility] ?01/21/2022 9:41 AM  ?Pager: see AMION ? ?If no response to pager , please call critical care on call (see AMION) until 7pm ?After 7:00 pm call Elink   ? ? ?  ?

## 2022-01-21 NOTE — Progress Notes (Signed)
eLink Physician-Brief Progress Note ?Patient Name: Ilea Hilton ?DOB: 1971/03/02 ?MRN: [id number] ? ? ?Date of Service ? 01/21/2022  ?HPI/Events of Note ? Fever to 100.9 F - Last WBC - 5.7. Last CXR without infiltrate. Related to ETOH W/D? AST and ALT both normal.   ?eICU Interventions ? Plan: ?Tylenol liquid 650 mg per tube Q 6 hours PRN Temp > 101.0 F. ?Defer further w/u and cultures to PCCM day rounding team.   ? ? ? ?Intervention Category ?Major Interventions: Other: ? ?Meila Berke Dennard Nip ?01/21/2022, 8:34 PM ?

## 2022-01-21 NOTE — Progress Notes (Addendum)
Neurology Progress Note ? ?Brief HPI:  ?Cindy Brooks is a 51 y.o. female with past medical history of HTN, TIA, alcohol abuse and medication noncompliance, who was found unresponsive in her car by family members on 4/8. Family members carried her into the house and placed her on the couch. Family returned on 4/9 @ 2200, found her in the same position unresponsive and called EMS. No family at the bedside. RN at bedside and states she has been receiving ativan for CIWA score and last dose was ~30 minutes ago. Per RN no seizure activity noticed, but patient will have burst of SVT in 180's. She is on nitro drip. RN states she called family and received consent to cut patients hair to apply EEG leads. Neurology consulted to further evaluate.  ?  ?On further interview with family member by telephone, she endorses that the patient drinks "a lot". When asked to specify, the family member stated that the patient consumes about a 12 pack of beer per day in addition to a fifth of hard liquor on a daily basis.  ? ?Subjective: ?Patient is intubated and on the vent, sedated and has OG tube in place. ? ?Exam: ?Vitals:  ? 01/21/22 1000 01/21/22 1100  ?BP: 127/85 136/87  ?Pulse: 73 82  ?Resp: 18 19  ?Temp:    ?SpO2: 100% 98%  ? ? ?General Physical Examination ? ?General:  No acute distress ?HEENT: EET to vent ?Neck:  Supple  ?Lungs:  No respiratory distress ?Abdomen:  Non-tender, Non-distended  ?Skin:  Warm and dry    ?Extremities:  Ext: warm, well perfused, intact peripheral pulses, no edema ? ?Neuro: ?Mental Status: Sedated, lethargic and intubated ? Does not follow commands, answer questions or respond to voice. Nonverbal. Easily agitated during exam. Will reach for head and grimace while EEG leads are being place.  ?CN: No blink to threat. PERRL. Eyes are conjugate. Reacts to touch and irritative stimuli. Face appears symmetric  ?. Cough and gag intact.  ?Motor: Moves all extremities equally and antigravity semipurposefully  when agitated.   ?Sensation- Withdraws to pain in all 4 extremities when precedex /fentanyl  is held. ?Coordination: Unable to assess due to lethargy ?Gait- Unable to assess ? ?Medications:  ? amLODipine  5 mg Per Tube Daily  ? chlorhexidine gluconate (MEDLINE KIT)  15 mL Mouth Rinse BID  ? Chlorhexidine Gluconate Cloth  6 each Topical Daily  ? docusate  100 mg Per Tube BID  ? enoxaparin (LOVENOX) injection  40 mg Subcutaneous Q24H  ? feeding supplement (PROSource TF)  45 mL Per Tube BID  ? feeding supplement (VITAL HIGH PROTEIN)  1,000 mL Per Tube P59F  ? folic acid  1 mg Per Tube Daily  ? mouth rinse  15 mL Mouth Rinse 10 times per day  ? multivitamin with minerals  1 tablet Per Tube Daily  ? pantoprazole sodium  40 mg Per Tube Daily  ? PHENObarbital  97.5 mg Intravenous Q8H  ? Followed by  ? [START ON 01/23/2022] PHENObarbital  65 mg Intravenous Q8H  ? Followed by  ? [START ON 01/25/2022] PHENObarbital  32.5 mg Intravenous Q8H  ? polyethylene glycol  17 g Per Tube Daily  ? [START ON 01/23/2022] thiamine  100 mg Per Tube Daily  ? ? ?Physical Examination ?Temp:  [95 ?F (35 ?C)-98.2 ?F (36.8 ?C)] 96.6 ?F (35.9 ?C) (04/10 0820) ?Pulse Rate:  [69-198] 82 (04/10 1100) ?Resp:  [10-34] 19 (04/10 1100) ?BP: (84-202)/(57-131) 136/87 (04/10 1100) ?SpO2:  [  93 %-100 %] 98 % (04/10 1100) ?FiO2 (%):  [30 %-40 %] 30 % (04/10 0400) ?Weight:  [78.7 kg-80.9 kg] 80.9 kg (04/10 0500) Faces Pain Scale: No hurt ? ?Results ?Labs:  ?Results for orders placed or performed during the hospital encounter of 01/20/22 (from the past 24 hour(s))  ?CBC with Differential/Platelet  ? Collection Time: 01/20/22 12:19 PM  ?Result Value Ref Range  ? WBC 9.6 4.0 - 10.5 K/uL  ? RBC 3.95 3.87 - 5.11 MIL/uL  ? Hemoglobin 10.7 (L) 12.0 - 15.0 g/dL  ? HCT 32.8 (L) 36.0 - 46.0 %  ? MCV 83.0 80.0 - 100.0 fL  ? MCH 27.1 26.0 - 34.0 pg  ? MCHC 32.6 30.0 - 36.0 g/dL  ? RDW 15.1 11.5 - 15.5 %  ? Platelets 213 150 - 400 K/uL  ? nRBC 0.0 0.0 - 0.2 %  ? Neutrophils  Relative % 80 %  ? Neutro Abs 7.6 1.7 - 7.7 K/uL  ? Lymphocytes Relative 13 %  ? Lymphs Abs 1.2 0.7 - 4.0 K/uL  ? Monocytes Relative 6 %  ? Monocytes Absolute 0.6 0.1 - 1.0 K/uL  ? Eosinophils Relative 1 %  ? Eosinophils Absolute 0.1 0.0 - 0.5 K/uL  ? Basophils Relative 0 %  ? Basophils Absolute 0.0 0.0 - 0.1 K/uL  ? Immature Granulocytes 0 %  ? Abs Immature Granulocytes 0.03 0.00 - 0.07 K/uL  ?Procalcitonin  ? Collection Time: 01/20/22 12:19 PM  ?Result Value Ref Range  ? Procalcitonin <0.10 ng/mL  ?Cortisol  ? Collection Time: 01/20/22 12:19 PM  ?Result Value Ref Range  ? Cortisol, Plasma 16.9 ug/dL  ?Protime-INR  ? Collection Time: 01/20/22 12:19 PM  ?Result Value Ref Range  ? Prothrombin Time 14.6 11.4 - 15.2 seconds  ? INR 1.2 0.8 - 1.2  ?APTT  ? Collection Time: 01/20/22 12:19 PM  ?Result Value Ref Range  ? aPTT 29 24 - 36 seconds  ?Creatinine, serum  ? Collection Time: 01/20/22 12:19 PM  ?Result Value Ref Range  ? Creatinine, Ser 0.76 0.44 - 1.00 mg/dL  ? GFR, Estimated >60 >60 mL/min  ?Lactic acid, plasma  ? Collection Time: 01/20/22 12:19 PM  ?Result Value Ref Range  ? Lactic Acid, Venous 3.2 (HH) 0.5 - 1.9 mmol/L  ?MRSA Next Gen by PCR, Nasal  ? Collection Time: 01/20/22  1:24 PM  ? Specimen: Nasal Mucosa; Nasal Swab  ?Result Value Ref Range  ? MRSA by PCR Next Gen NOT DETECTED NOT DETECTED  ?Strep pneumoniae urinary antigen  ? Collection Time: 01/20/22  2:56 PM  ?Result Value Ref Range  ? Strep Pneumo Urinary Antigen NEGATIVE NEGATIVE  ?I-STAT 7, (LYTES, BLD GAS, ICA, H+H)  ? Collection Time: 01/20/22  3:36 PM  ?Result Value Ref Range  ? pH, Arterial 7.330 (L) 7.35 - 7.45  ? pCO2 arterial 39.8 32 - 48 mmHg  ? pO2, Arterial 82 (L) 83 - 108 mmHg  ? Bicarbonate 21.1 20.0 - 28.0 mmol/L  ? TCO2 22 22 - 32 mmol/L  ? O2 Saturation 95 %  ? Acid-base deficit 5.0 (H) 0.0 - 2.0 mmol/L  ? Sodium 141 135 - 145 mmol/L  ? Potassium 3.4 (L) 3.5 - 5.1 mmol/L  ? Calcium, Ion 1.20 1.15 - 1.40 mmol/L  ? HCT 30.0 (L) 36.0 -  46.0 %  ? Hemoglobin 10.2 (L) 12.0 - 15.0 g/dL  ? Patient temperature 98.2 F   ? Collection site RADIAL, ALLEN'S TEST ACCEPTABLE   ?  Drawn by RT   ? Sample type ARTERIAL   ?Lactic acid, plasma  ? Collection Time: 01/20/22  3:49 PM  ?Result Value Ref Range  ? Lactic Acid, Venous 3.3 (HH) 0.5 - 1.9 mmol/L  ?Magnesium  ? Collection Time: 01/20/22  3:53 PM  ?Result Value Ref Range  ? Magnesium 1.8 1.7 - 2.4 mg/dL  ?Phosphorus  ? Collection Time: 01/20/22  3:53 PM  ?Result Value Ref Range  ? Phosphorus 3.4 2.5 - 4.6 mg/dL  ?Potassium  ? Collection Time: 01/20/22  3:53 PM  ?Result Value Ref Range  ? Potassium 3.7 3.5 - 5.1 mmol/L  ?Glucose, capillary  ? Collection Time: 01/20/22  4:17 PM  ?Result Value Ref Range  ? Glucose-Capillary 111 (H) 70 - 99 mg/dL  ?Glucose, capillary  ? Collection Time: 01/20/22  7:58 PM  ?Result Value Ref Range  ? Glucose-Capillary 117 (H) 70 - 99 mg/dL  ?Glucose, capillary  ? Collection Time: 01/20/22 11:36 PM  ?Result Value Ref Range  ? Glucose-Capillary 121 (H) 70 - 99 mg/dL  ?Glucose, capillary  ? Collection Time: 01/21/22  3:33 AM  ?Result Value Ref Range  ? Glucose-Capillary 140 (H) 70 - 99 mg/dL  ?I-STAT 7, (LYTES, BLD GAS, ICA, H+H)  ? Collection Time: 01/21/22  3:54 AM  ?Result Value Ref Range  ? pH, Arterial 7.406 7.35 - 7.45  ? pCO2 arterial 35.0 32 - 48 mmHg  ? pO2, Arterial 84 83 - 108 mmHg  ? Bicarbonate 22.3 20.0 - 28.0 mmol/L  ? TCO2 23 22 - 32 mmol/L  ? O2 Saturation 97 %  ? Acid-base deficit 2.0 0.0 - 2.0 mmol/L  ? Sodium 143 135 - 145 mmol/L  ? Potassium 3.7 3.5 - 5.1 mmol/L  ? Calcium, Ion 1.28 1.15 - 1.40 mmol/L  ? HCT 29.0 (L) 36.0 - 46.0 %  ? Hemoglobin 9.9 (L) 12.0 - 15.0 g/dL  ? Patient temperature 95.9 F   ? Collection site RADIAL, ALLEN'S TEST ACCEPTABLE   ? Drawn by Operator   ? Sample type ARTERIAL   ?CBC  ? Collection Time: 01/21/22  5:52 AM  ?Result Value Ref Range  ? WBC 5.7 4.0 - 10.5 K/uL  ? RBC 3.75 (L) 3.87 - 5.11 MIL/uL  ? Hemoglobin 10.4 (L) 12.0 - 15.0 g/dL   ? HCT 30.3 (L) 36.0 - 46.0 %  ? MCV 80.8 80.0 - 100.0 fL  ? MCH 27.7 26.0 - 34.0 pg  ? MCHC 34.3 30.0 - 36.0 g/dL  ? RDW 15.3 11.5 - 15.5 %  ? Platelets 178 150 - 400 K/uL  ? nRBC 0.0 0.0 - 0.2 %  ?Basic me

## 2022-01-21 NOTE — Progress Notes (Signed)
?  Transition of Care (TOC) Screening Note ? ? ?Patient Details  ?Name: Cindy Brooks ?Date of Birth: 06/17/1971 ? ? ?Transition of Care (TOC) CM/SW Contact:    ?Mearl Latin, LCSW ?Phone Number: ?01/21/2022, 10:01 AM ? ? ? ?Transition of Care Department [facility]) has reviewed patient. We will continue to monitor patient advancement through interdisciplinary progression rounds. If new patient transition needs arise, please place a TOC consult. ? ? ?

## 2022-01-21 NOTE — Progress Notes (Signed)
Discontinued cEEG study.  Notified [facility] monitoring.  No skin breakdown observed. 

## 2022-01-21 NOTE — Progress Notes (Signed)
Initial Nutrition Assessment ? ?DOCUMENTATION CODES:  ? ?Not applicable ? ?INTERVENTION:  ? ?Tube feeds via OG tube: ?- Change to Vital 1.5 @ 55 ml/hr (1320 ml/day) ?- ProSource TF 45 ml daily ? ?Tube feeding regimen provides 2020 kcal, 100 grams of protein, and 1008 ml of H2O. ? ?Monitor magnesium, potassium, and phosphorus BID for at least 3 days, MD to replete as needed, as pt is at risk for refeeding syndrome given EtOH abuse. ? ?- Checking vitamin and mineral labs (vitamin B6, vitamin B12, vitamin A, zinc) ? ?- Continue MVI with minerals daily per tube ? ?NUTRITION DIAGNOSIS:  ? ?Inadequate oral intake related to inability to eat as evidenced by NPO status. ? ?GOAL:  ? ?Patient will meet greater than or equal to 90% of their needs ? ?MONITOR:  ? ?Vent status, Labs, TF tolerance, I & O's ? ?REASON FOR ASSESSMENT:  ? ?Ventilator, Consult ?Enteral/tube feeding initiation and management ? ?ASSESSMENT:  ? ?51 year old female who presented to the ED on 4/09 with AMS. PMH of EtOH abuse, HTN, TIA, medication noncompliance. Pt admitted with acute encephalopathy. ? ?04/09 - pt intubated for airway protection ? ?Consult received for enteral nutrition initiation and management. Pt with OG tube in stomach. Tube feeds infusing per protocol (Vital High Protein @ 40 ml/hr with ProSource TF 45 ml BID). RD to adjust tube feeding regimen to better meet pt's needs. ? ?Pt's family reports consumes about a 12 pack of beer per day in addition to a fifth of hard liquor on a daily basis. Unable to obtain further details about diet and weight history at this time. No weight history available in chart. Given EtOH abuse, will check vitamin B6, vitamin B12, vitamin A, and zinc labs. Thiamine lab is already in process and pt is currently receiving both thiamine and folic acid supplementation. Pt is also at refeeding risk secondary to EtOH abuse. ? ?Admit weight: 78.7 kg (BMI 29.8) ?Current weight: 80.9 kg ? ?Patient is currently intubated  on ventilator support ?MV: 7.8 L/min ?Temp (24hrs), Avg:96.5 ?F (35.8 ?C), Min:95 ?F (35 ?C), Max:97.6 ?F (36.4 ?C) ? ?Drips: ?Precedex ?Fentanyl ? ?Medications reviewed and include: colace, folic acid, MVI with minerals daily, protonix, phenobarbital taper, miralax, IV abx, IV thiamine ? ?Vitamin/Mineral Profile: ?Vitamin B6: pending ?Vitamin B12: pending ?Vitamin A: pending ?Zinc: pending ? ?Labs reviewed: lactic acid 3.3 on 4/09 ?CBG's: 87-140 x 24 hours ? ?UOP: 1800 ml x 24 hours ?I/O's: +4.6 L since admit ? ?NUTRITION - FOCUSED PHYSICAL EXAM: ? ?Flowsheet Row Most Recent Value  ?Orbital Region No depletion  ?Upper Arm Region No depletion  ?Thoracic and Lumbar Region No depletion  ?Buccal Region Unable to assess  ?Temple Region No depletion  ?Clavicle Bone Region No depletion  ?Clavicle and Acromion Bone Region No depletion  ?Scapular Bone Region No depletion  ?Dorsal Hand No depletion  ?Patellar Region No depletion  ?Anterior Thigh Region No depletion  ?Posterior Calf Region No depletion  ?Edema (RD Assessment) None  ?Hair Reviewed  ?Eyes Reviewed  ?Mouth Reviewed  ?Skin Reviewed  ?Nails Reviewed  ? ?  ? ? ?Diet Order:   ?Diet Order   ? ?       ?  Diet NPO time specified  Diet effective now       ?  ? ?  ?  ? ?  ? ? ?EDUCATION NEEDS:  ? ?No education needs have been identified at this time ? ?Skin:  Skin Assessment: Reviewed RN Assessment ? ?  Last BM:  no documented BM ? ?Height:  ? ?Ht Readings from Last 1 Encounters:  ?01/20/22 5\' 4"  (1.626 m)  ? ? ?Weight:  ? ?Wt Readings from Last 1 Encounters:  ?01/21/22 80.9 kg  ? ? ?BMI:  Body mass index is 30.61 kg/m?. ? ?Estimated Nutritional Needs:  ? ?Kcal:  1800-2000 ? ?Protein:  90-110 grams ? ?Fluid:  1.8-2.0 L ? ? ? ?Gustavus Bryant, MS, RD, LDN ?Inpatient Clinical Dietitian ?Please see AMiON for contact information. ? ?

## 2022-01-21 NOTE — Procedures (Addendum)
Patient Name: Cindy Brooks  ?MRN: [id number]  ?Epilepsy Attending: Lora Havens  ?Duration: 01/20/2022 1503 to 01/21/2022 1141 ?  ?Patient history: 51yo F with altered mental status in the setting of suspected substance abuse, alcohol abuse and reported seizure activity. EEG to evaluate for seizure ?  ?Level of alertness: lethargic ?  ?AEDs during EEG study: Phenobarb, LEV ?  ?Technical aspects: This EEG study was done with scalp electrodes positioned according to the 10-20 International system of electrode placement. Electrical activity was acquired at a sampling rate of 500Hz  and reviewed with a high frequency filter of 70Hz  and a low frequency filter of 1Hz . EEG data were recorded continuously and digitally stored.  ?  ?Description: No clear posterior dominant rhythm was seen.  EEG showed continuous generalized 5 to 7 Hz theta- delta slowing admixed with an excessive amount of 15 to 18 Hz beta activity distributed symmetrically and diffusely. Hyperventilation and photic stimulation were not performed.    ?  ?ABNORMALITY ?-Continuous slow, generalized ?- Excessive beta, generalized ?  ?IMPRESSION: ?This study is suggestive of moderate to severe diffuse encephalopathy, nonspecific etiology. The excessive beta activity seen in the background is most likely due to the effect of medications like benzodiazepine and is a benign EEG pattern. No seizures or epileptiform discharges were seen throughout the recording. ?  ?Lora Havens  ?

## 2022-01-22 ENCOUNTER — Inpatient Hospital Stay (HOSPITAL_COMMUNITY): Payer: No Typology Code available for payment source

## 2022-01-22 DIAGNOSIS — I6381 Other cerebral infarction due to occlusion or stenosis of small artery: Secondary | ICD-10-CM | POA: Diagnosis present

## 2022-01-22 DIAGNOSIS — I6340 Cerebral infarction due to embolism of unspecified cerebral artery: Secondary | ICD-10-CM | POA: Insufficient documentation

## 2022-01-22 DIAGNOSIS — I6389 Other cerebral infarction: Secondary | ICD-10-CM

## 2022-01-22 DIAGNOSIS — I639 Cerebral infarction, unspecified: Secondary | ICD-10-CM | POA: Diagnosis present

## 2022-01-22 LAB — CBC WITH DIFFERENTIAL/PLATELET
Abs Immature Granulocytes: 0.09 10*3/uL — ABNORMAL HIGH (ref 0.00–0.07)
Basophils Absolute: 0 10*3/uL (ref 0.0–0.1)
Basophils Relative: 0 %
Eosinophils Absolute: 0.2 10*3/uL (ref 0.0–0.5)
Eosinophils Relative: 2 %
HCT: 36.7 % (ref 36.0–46.0)
Hemoglobin: 12.1 g/dL (ref 12.0–15.0)
Immature Granulocytes: 1 %
Lymphocytes Relative: 21 %
Lymphs Abs: 2 10*3/uL (ref 0.7–4.0)
MCH: 27.3 pg (ref 26.0–34.0)
MCHC: 33 g/dL (ref 30.0–36.0)
MCV: 82.7 fL (ref 80.0–100.0)
Monocytes Absolute: 0.7 10*3/uL (ref 0.1–1.0)
Monocytes Relative: 7 %
Neutro Abs: 6.8 10*3/uL (ref 1.7–7.7)
Neutrophils Relative %: 69 %
Platelets: 238 10*3/uL (ref 150–400)
RBC: 4.44 MIL/uL (ref 3.87–5.11)
RDW: 15.3 % (ref 11.5–15.5)
WBC: 9.7 10*3/uL (ref 4.0–10.5)
nRBC: 0 % (ref 0.0–0.2)

## 2022-01-22 LAB — COMPREHENSIVE METABOLIC PANEL
ALT: 13 U/L (ref 0–44)
AST: 17 U/L (ref 15–41)
Albumin: 3.3 g/dL — ABNORMAL LOW (ref 3.5–5.0)
Alkaline Phosphatase: 50 U/L (ref 38–126)
Anion gap: 7 (ref 5–15)
BUN: 11 mg/dL (ref 6–20)
CO2: 23 mmol/L (ref 22–32)
Calcium: 9.1 mg/dL (ref 8.9–10.3)
Chloride: 113 mmol/L — ABNORMAL HIGH (ref 98–111)
Creatinine, Ser: 0.92 mg/dL (ref 0.44–1.00)
GFR, Estimated: >60 mL/min (ref >60)
Glucose, Bld: 112 mg/dL — ABNORMAL HIGH (ref 70–99)
Potassium: 4.3 mmol/L (ref 3.5–5.1)
Sodium: 143 mmol/L (ref 135–145)
Total Bilirubin: 0.3 mg/dL (ref 0.3–1.2)
Total Protein: 7.4 g/dL (ref 6.5–8.1)

## 2022-01-22 LAB — LIPID PANEL
Cholesterol: 225 mg/dL — ABNORMAL HIGH (ref 0–200)
HDL: 57 mg/dL (ref >40)
LDL Cholesterol: 132 mg/dL — ABNORMAL HIGH (ref 0–99)
Total CHOL/HDL Ratio: 3.9 RATIO
Triglycerides: 181 mg/dL — ABNORMAL HIGH (ref <150)
VLDL: 36 mg/dL (ref 0–40)

## 2022-01-22 LAB — GLUCOSE, CAPILLARY
Glucose-Capillary: 104 mg/dL — ABNORMAL HIGH (ref 70–99)
Glucose-Capillary: 116 mg/dL — ABNORMAL HIGH (ref 70–99)
Glucose-Capillary: 119 mg/dL — ABNORMAL HIGH (ref 70–99)
Glucose-Capillary: 73 mg/dL (ref 70–99)
Glucose-Capillary: 125 mg/dL — ABNORMAL HIGH (ref 70–99)
Glucose-Capillary: 129 mg/dL — ABNORMAL HIGH (ref 70–99)
Glucose-Capillary: 131 mg/dL — ABNORMAL HIGH (ref 70–99)

## 2022-01-22 LAB — PHOSPHORUS: Phosphorus: 4.3 mg/dL (ref 2.5–4.6)

## 2022-01-22 LAB — ECHOCARDIOGRAM COMPLETE BUBBLE STUDY
Area-P 1/2: 3.87 cm2
S' Lateral: 3.2 cm

## 2022-01-22 LAB — HEMOGLOBIN A1C
Hgb A1c MFr Bld: 5.7 % — ABNORMAL HIGH (ref 4.8–5.6)
Mean Plasma Glucose: 116.89 mg/dL

## 2022-01-22 LAB — MAGNESIUM: Magnesium: 2.1 mg/dL (ref 1.7–2.4)

## 2022-01-22 LAB — ZINC: Zinc: 69 ug/dL (ref 44–115)

## 2022-01-22 IMAGING — MR MR HEAD W/O CM
12 of 17 series · 34 of 48 positions shown · non-contrast
Comparison: CT from [DATE].

CLINICAL DATA: Initial evaluation for neuro deficit, stroke
suspected.

EXAM:
MRI HEAD WITHOUT CONTRAST
TECHNIQUE: Multiplanar, multiecho pulse sequences of the brain and surrounding
structures were obtained without intravenous contrast.

[Series 9: DWI · axial · 3.0mm · 0.92mm/px · z∈[-99,+83]mm · 5 of 124 slices shown (1 of 4)]
[im 1/124]
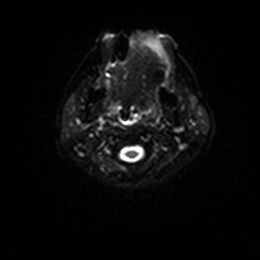
[im 31/124]
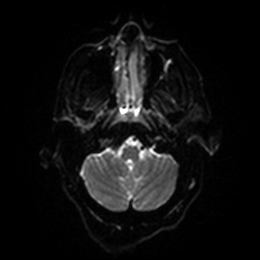
[im 62/124]
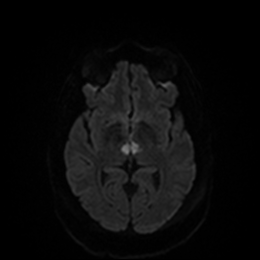
[im 93/124]
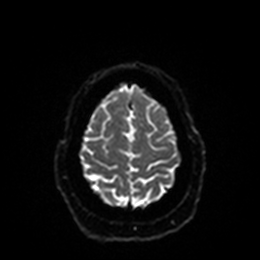
[im 124/124]
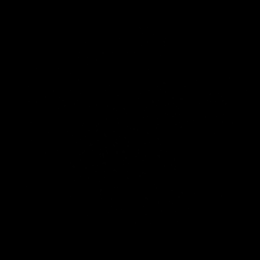

[Series 10: DWI · axial · 3.0mm · 0.92mm/px · z∈[-99,+80]mm · 2 of 60 slices shown (2 of 4)]
[im 1/60]
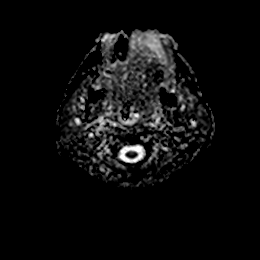
[im 60/60]
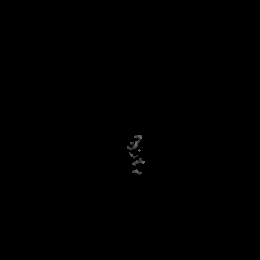

[Series 11: DWI · coronal · 4.0mm · 0.88mm/px · 4 of 80 slices shown (3 of 4)]
[im 1/80]
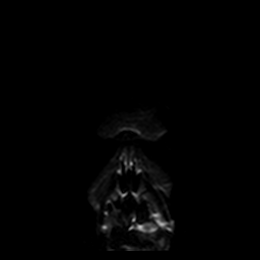
[im 27/80]
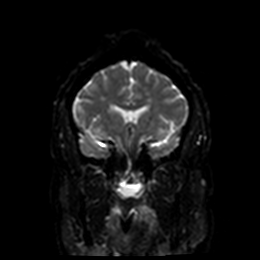
[im 53/80]
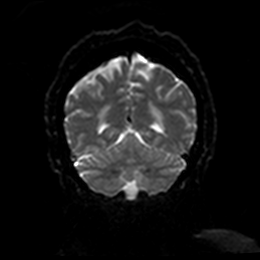
[im 80/80]
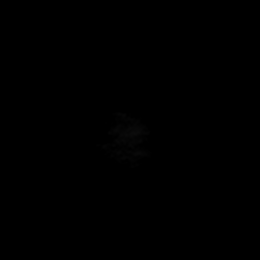

[Series 12: DWI · coronal · 4.0mm · 0.88mm/px · 2 of 40 slices shown (4 of 4)]
[im 1/40]
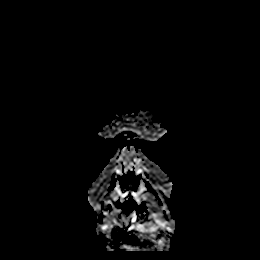
[im 40/40]
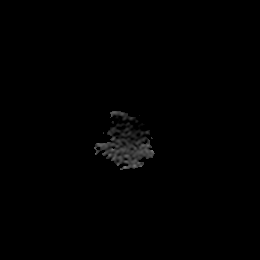

[Series 13: T1 · sagittal · 5.0mm · 0.75mm/px · 1 of 24 slices shown]
[im 1/24]
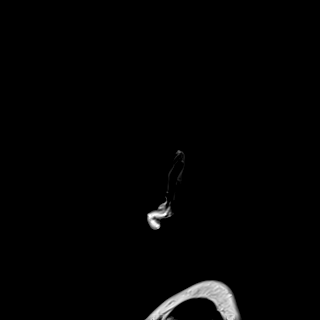

[Series 14: T2 · axial · 5.0mm · 0.75mm/px · 1 of 29 slices shown (1 of 3)]
[im 1/29]
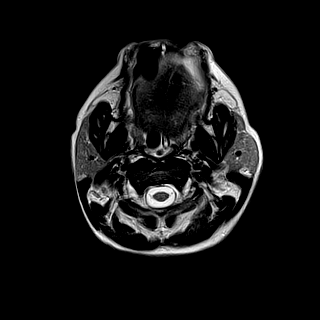

[Series 15: FLAIR · axial · 5.0mm · 0.47mm/px · 1 of 30 slices shown (1 of 2)]
[im 1/30]
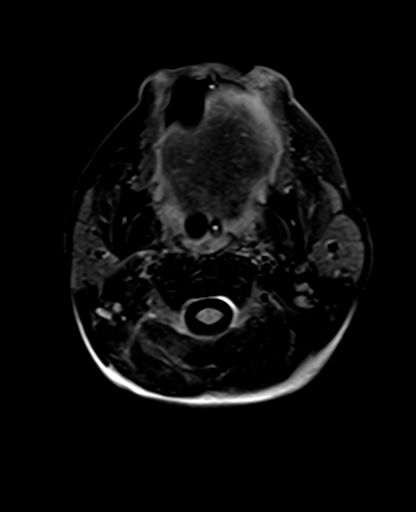

[Series 16: t1_mprage_tra_p2_iso · axial · 1.0mm · 0.98mm/px · z∈[-96,+78]mm · 8 of 176 slices shown]
[im 1/176]
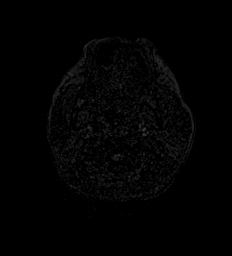
[im 26/176]
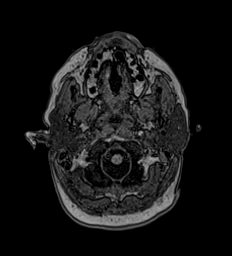
[im 51/176]
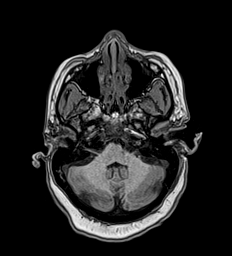
[im 76/176]
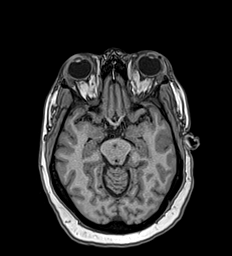
[im 101/176]
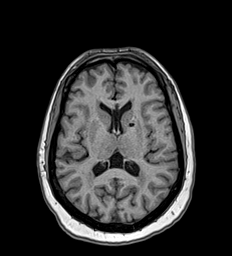
[im 126/176]
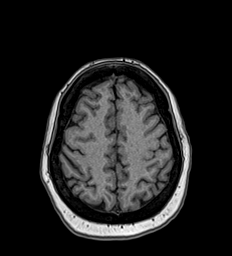
[im 151/176]
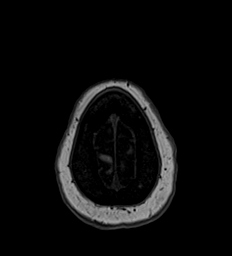
[im 176/176]
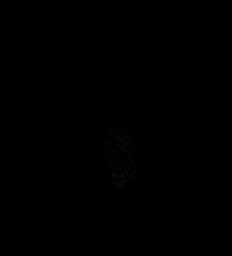

[Series 17: t1_mprage_tra_p2_iso_mpr_coronal · coronal · 1.0mm · 0.45mm/px · 5 of 128 slices shown]
[im 1/128]
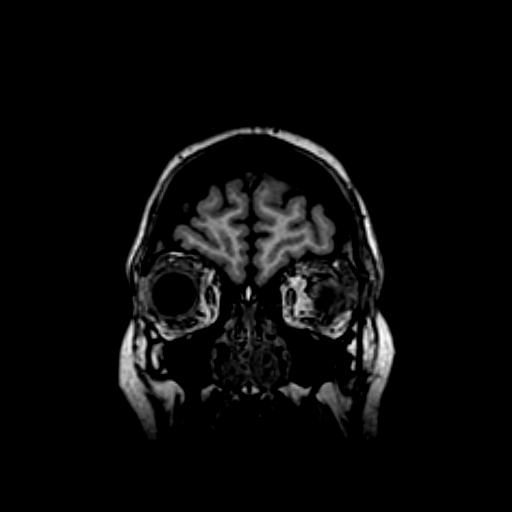
[im 26/128]
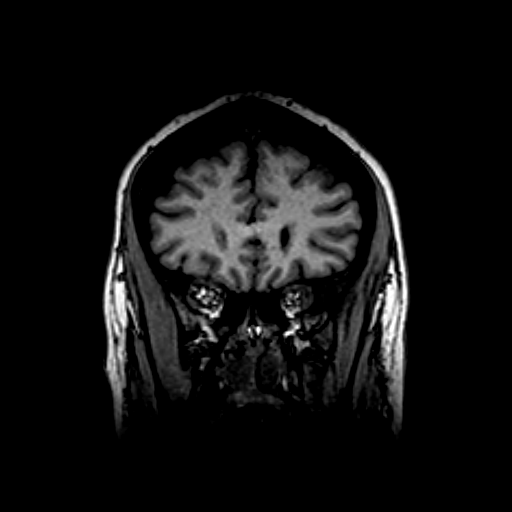
[im 51/128]
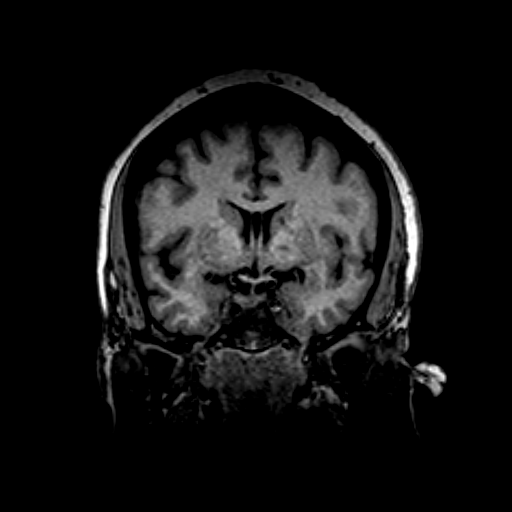
[im 77/128]
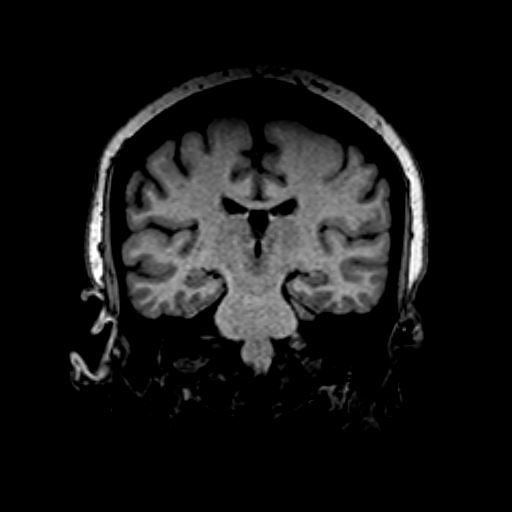
[im 102/128]
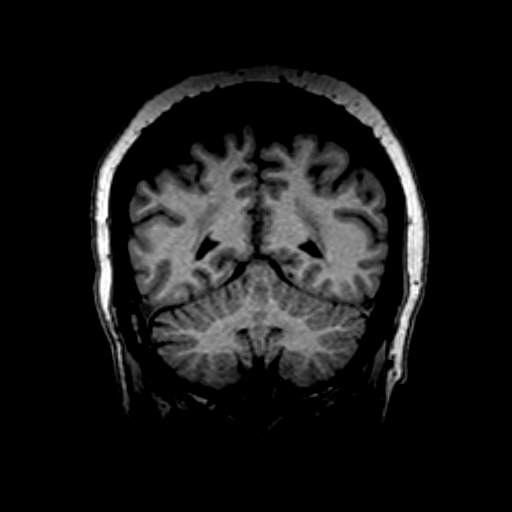

[Series 18: T2 · coronal · 3.0mm · 0.30mm/px · 2 of 39 slices shown (2 of 3)]
[im 1/39]
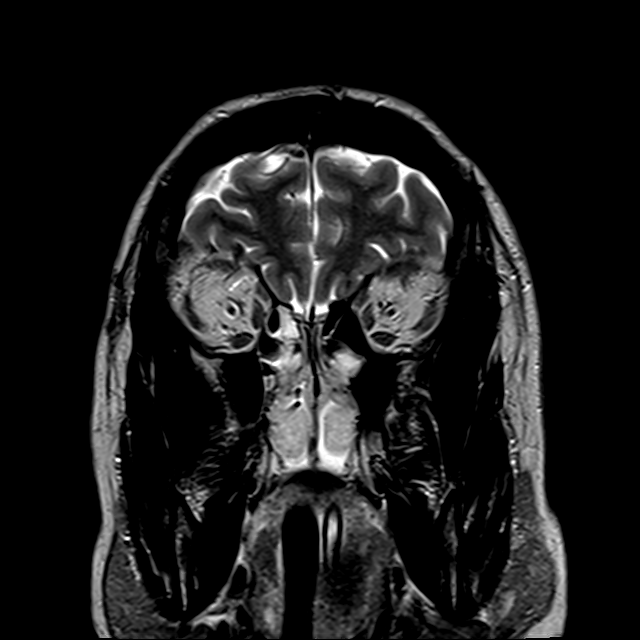
[im 39/39]
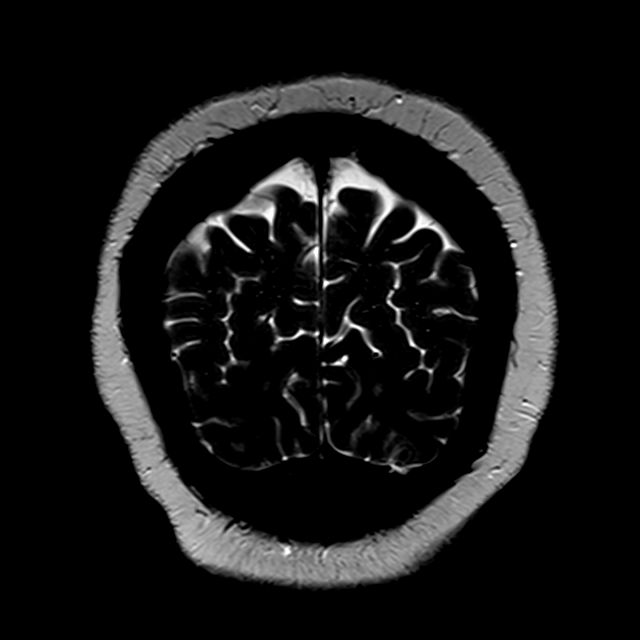

[Series 19: FLAIR · coronal · 3.0mm · 0.59mm/px · 2 of 40 slices shown (2 of 2)]
[im 1/40]
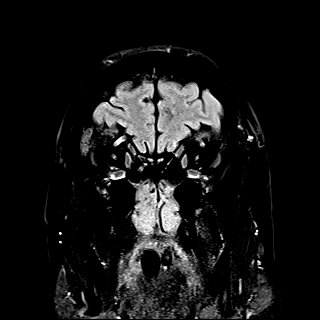
[im 40/40]
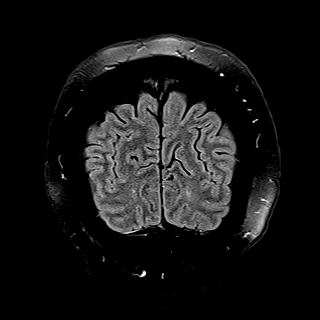

[Series 25: T2 · coronal · 5.0mm · 0.34mm/px · 1 of 33 slices shown (3 of 3)]
[im 1/33]
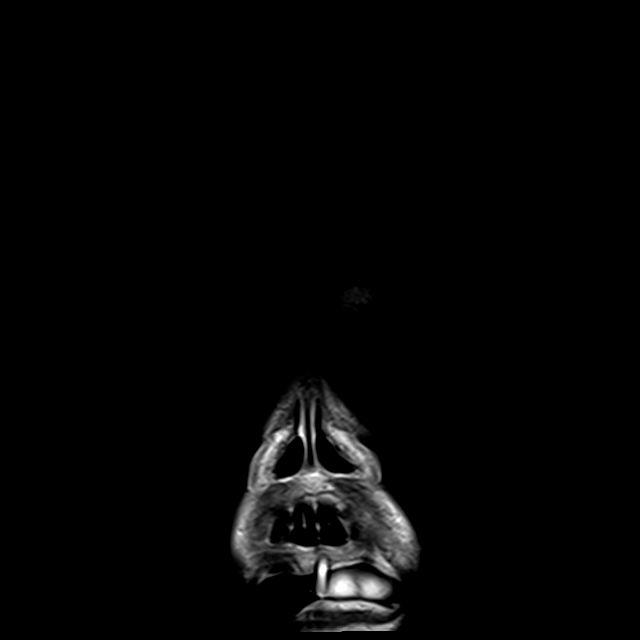

[34 of 48 positions shown; findings below may reference images not displayed]

FINDINGS: Brain: Cerebral volume within normal limits. Patchy T2/FLAIR
hyperintensity involving the periventricular deep white matter both
cerebral hemispheres most consistent with chronic small vessel
ischemic disease. Few scatter remote lacunar infarcts noted at the
left basal ganglia, anterior genu of the corpus callosum, and pons.

Patchy restricted diffusion involving both thalami in a fairly
symmetric fashion is seen (series 9, image 94). Inferior extension
into the central and left paramedian midbrain. Overall appearance is
favored to reflect an acute ischemic infarct, likely an artery of
NIFASHA infarct. No associated hemorrhage or significant regional
mass effect. Multiple additional scattered punctate foci of
diffusion abnormality involving both cerebral hemispheres likely
reflects subtle acute to early subacute nonhemorrhagic ischemic
infarcts as well. Otherwise, gray-white matter differentiation
maintained. No acute intracranial hemorrhage. Multiple scattered
chronic micro hemorrhages noted about the deep gray nuclei and
cerebellum, likely related to chronic poorly controlled
hypertension.

No mass lesion or midline shift. No hydrocephalus or extra-axial
fluid collection. Pituitary gland suprasellar region normal. Midline
structures intact.

Vascular: Major intracranial vascular flow voids are maintained. No
signal changes to suggest dural venous sinus thrombosis about the
deep venous system visualized.

Skull and upper cervical spine: Craniocervical junction within
normal limits. Bone marrow signal intensity diffusely decreased on
T1 weighted sequence, nonspecific, but most commonly related to
anemia, smoking or obesity. No focal marrow replacing lesion. No
scalp soft tissue abnormality.

Sinuses/Orbits: Globes orbital soft tissues demonstrate no acute
finding. Scattered mucosal thickening noted throughout the paranasal
sinuses. Fluid present within the nasopharynx. Patient is intubated.
Mastoid air cells remain largely clear.

Other: None.
IMPRESSION: 1. Patchy restricted diffusion involving both thalami and midbrain,
likely reflecting an acute ischemic nonhemorrhagic artery of
NIFASHA infarct. No significant mass effect.
2. Multiple additional punctate acute to early subacute
nonhemorrhagic ischemic infarcts involving both cerebral
hemispheres.
3. Multiple scattered chronic micro hemorrhages about the deep gray
nuclei and cerebellum, likely related to chronic poorly controlled
hypertension.
4. Underlying chronic microvascular ischemic disease with a few
additional chronic lacunar infarcts as above.

## 2022-01-22 IMAGING — CT CT VENOGRAM HEAD
1 of 7 series · 2 of 36 positions shown · IV contrast (Omni 300)
Comparison: None.

CLINICAL DATA: Stroke, follow up; stroke

EXAM:
CT ANGIOGRAPHY HEAD AND NECK
CT VENOGRAM
TECHNIQUE: Multidetector CT imaging of the head and neck was performed using
the standard protocol during bolus administration of intravenous
contrast. Multiplanar CT image reconstructions and MIPs were
obtained to evaluate the vascular anatomy. Carotid stenosis
measurements (when applicable) are obtained utilizing NASCET
criteria, using the distal internal carotid diameter as the
denominator.

[Series 3: head with · axial · 0.44mm/px · z∈[-70,-12]mm · 2 of 88 slices shown]
[im 30/88  soft-tissue]
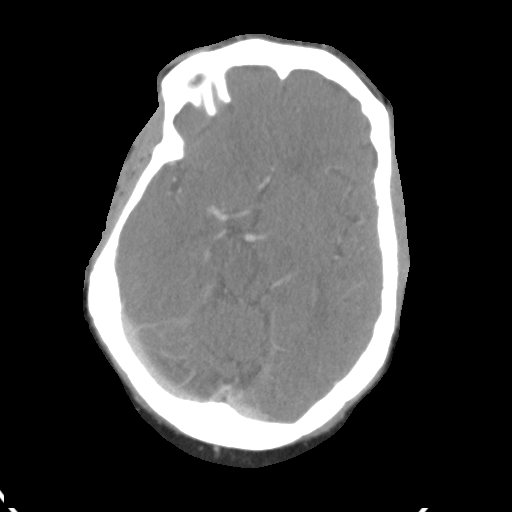
[im 59/88  bone]
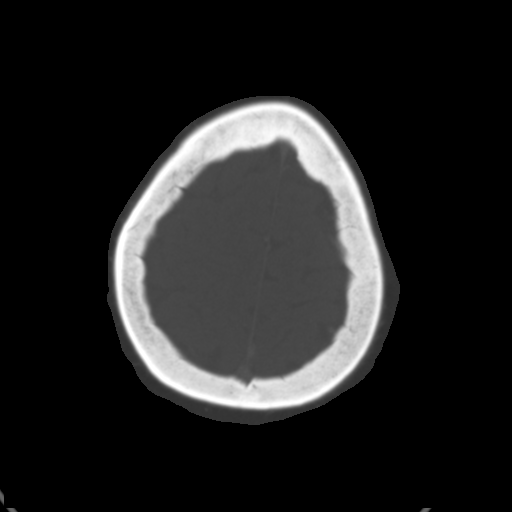

[2 of 36 positions shown; findings below may reference images not displayed]

Contiguous axial images were obtained from the base of the skull
through the vertex during the bolus administration of intravenous
contrast using CTV timing. Multiplanar CT image reconstructions and
MIPs were obtained to evaluate the venous anatomy.

RADIATION DOSE REDUCTION: This exam was performed according to the
departmental dose-optimization program which includes automated
exposure control, adjustment of the mA and/or kV according to
patient size and/or use of iterative reconstruction technique.

CONTRAST:  100mL OMNIPAQUE IOHEXOL 350 MG/ML SOLN
FINDINGS: CT HEAD FINDINGS

Brain: Infarcts in bilateral thalami, better characterized on recent
MRI. Associated edema without significant mass effect. No acute
hemorrhage, midline shift mass lesion, or extra-axial fluid
collection. No hydrocephalus.

Vascular: See below.

Skull: No acute fracture.

Sinuses: Mild paranasal sinus mucosal thickening.

Orbits: No acute findings.

Review of the MIP images confirms the above findings

CTA NECK FINDINGS

Aortic arch: Great vessel origins are patent.

Right carotid system: No evidence of dissection, stenosis (50% or
greater) or occlusion.

Left carotid system: Atherosclerosis at the carotid bifurcation with
approximately 40% stenosis.

Vertebral arteries: Codominant. No evidence of dissection, stenosis
(50% or greater) or occlusion.

Skeleton: No acute fracture.

Other neck: No acute findings.

Upper chest: Opacities in the visualized lung apices, probably
atelectasis or aspiration.

Review of the MIP images confirms the above findings

CTA HEAD FINDINGS

Anterior circulation: Bilateral intracranial ICAs and MCAs are
patent. Severe proximal left M2 MCA branch stenosis. Bilateral ACAs
are patent. Severe right A2 ACA stenosis.

Posterior circulation: Bilateral intradural vertebral arteries and
basilar artery are patent with mild atherosclerotic narrowing.
Severe bilateral P1 PCA stenosis. Severe right P2 PCA stenosis.
Moderate left P2 PCA stenosis

Review of the MIP images confirms the above findings

CTV HEAD FINDINGS

No evidence of dural venous sinus thrombosis. The superior sagittal
sinus, straight, transverse and sigmoid sinuses are patent. The
visualized deep cerebral veins are patent. The jugular bulbs are
patent. Symmetric cavernous sinus opacification.
IMPRESSION: 1. Severe bilateral P1 and right P2 PCA stenoses. Redemonstrated
bilateral thalamic infarcts, better characterized on recent MRI and
probably related to artery of SARINA hypoperfusion. This vessel
is too small to characterize by CTA though.
2. Severe left proximal M2 MCA branch stenosis.
3. Severe right A2 ACA stenosis.
4. Approximately 40% stenosis at the left carotid bifurcation in the
neck.
5. No evidence of dural venous sinus thrombosis.
6. Opacities in the visualized lung apices, probably atelectasis or
aspiration.

## 2022-01-22 IMAGING — CT CT ANGIO HEAD-NECK (W OR W/O PERF)
2 of 11 series · 6 of 34 positions shown · IV contrast (OMNI 350)
Comparison: None.

CLINICAL DATA: Stroke, follow up; stroke

EXAM:
CT ANGIOGRAPHY HEAD AND NECK
CT VENOGRAM
TECHNIQUE: Multidetector CT imaging of the head and neck was performed using
the standard protocol during bolus administration of intravenous
contrast. Multiplanar CT image reconstructions and MIPs were
obtained to evaluate the vascular anatomy. Carotid stenosis
measurements (when applicable) are obtained utilizing NASCET
criteria, using the distal internal carotid diameter as the
denominator.

[Series 9: sagittal · sagittal · 0.30mm/px · 1 of 56 slices shown]
[im 14/56  soft-tissue]
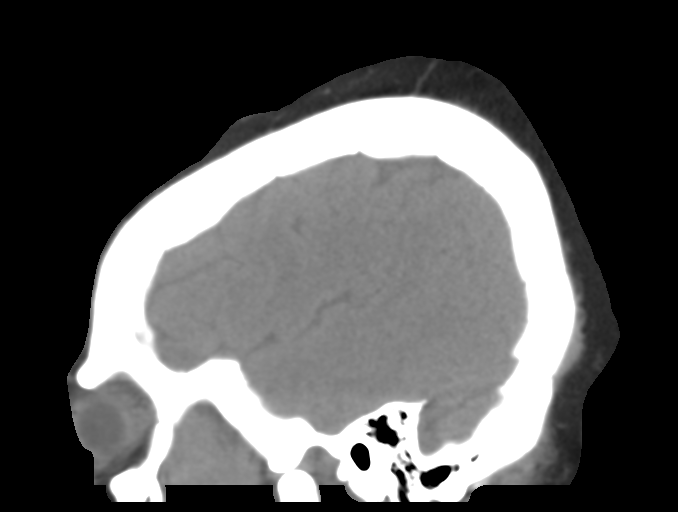

[Series 12: cta neck axial · axial · 0.42mm/px · z∈[-244,-15]mm · 5 of 345 slices shown]
[im 58/345  soft-tissue]
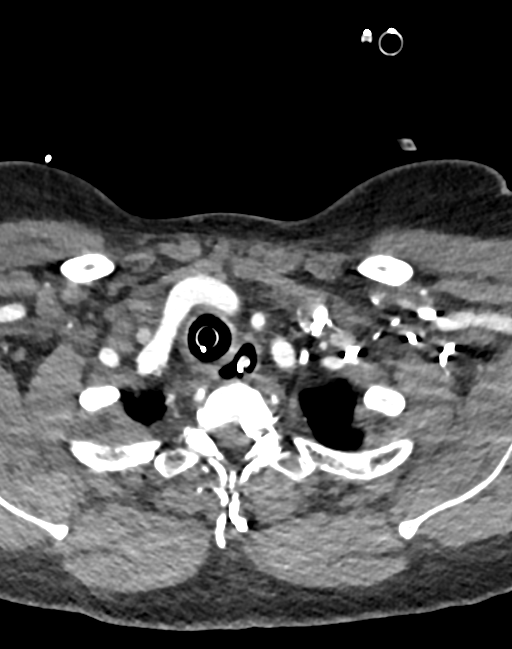
[im 115/345  bone]
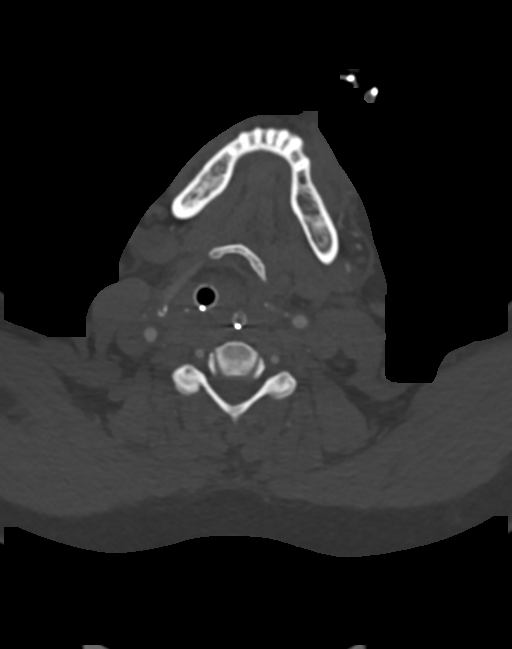
[im 173/345  soft-tissue]
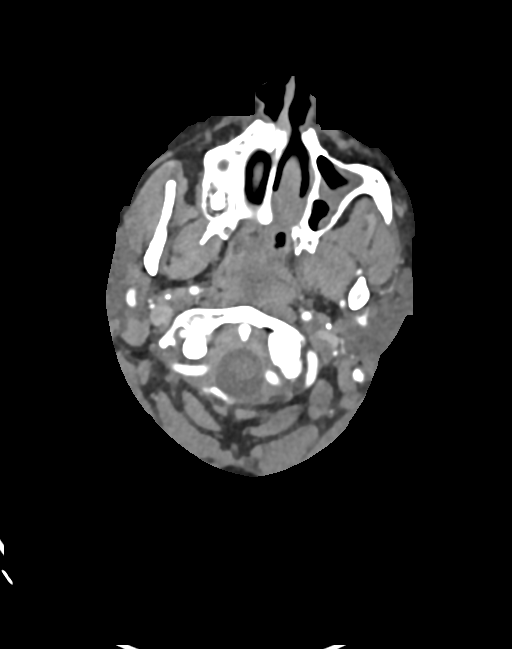
[im 230/345  bone]
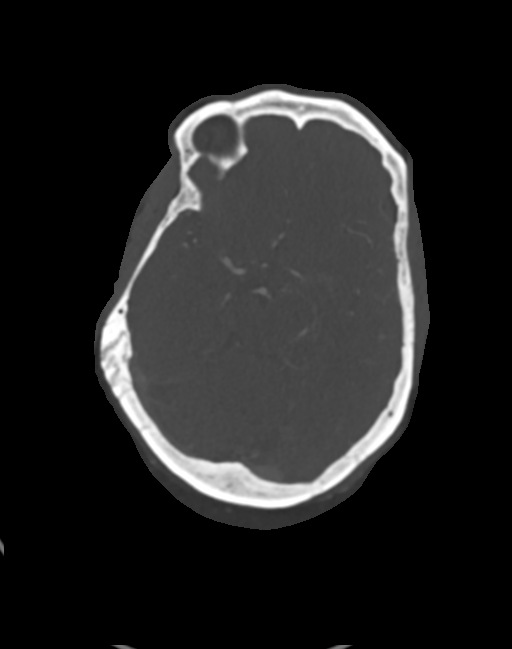
[im 287/345  soft-tissue]
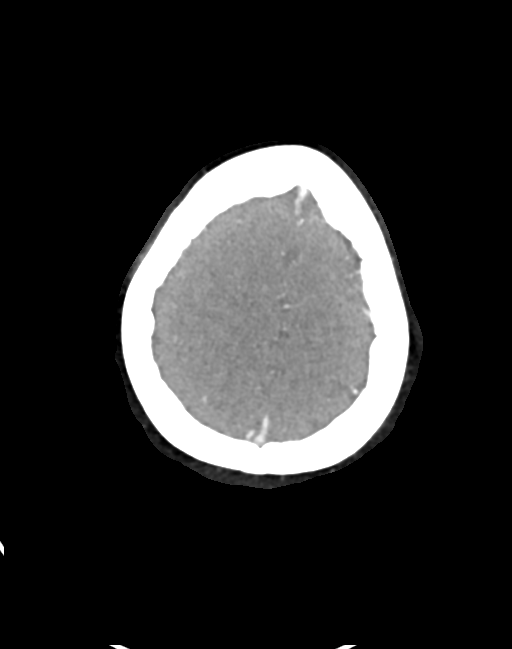

[6 of 34 positions shown; findings below may reference images not displayed]

Contiguous axial images were obtained from the base of the skull
through the vertex during the bolus administration of intravenous
contrast using CTV timing. Multiplanar CT image reconstructions and
MIPs were obtained to evaluate the venous anatomy.

RADIATION DOSE REDUCTION: This exam was performed according to the
departmental dose-optimization program which includes automated
exposure control, adjustment of the mA and/or kV according to
patient size and/or use of iterative reconstruction technique.

CONTRAST:  100mL OMNIPAQUE IOHEXOL 350 MG/ML SOLN
FINDINGS: CT HEAD FINDINGS

Brain: Infarcts in bilateral thalami, better characterized on recent
MRI. Associated edema without significant mass effect. No acute
hemorrhage, midline shift mass lesion, or extra-axial fluid
collection. No hydrocephalus.

Vascular: See below.

Skull: No acute fracture.

Sinuses: Mild paranasal sinus mucosal thickening.

Orbits: No acute findings.

Review of the MIP images confirms the above findings

CTA NECK FINDINGS

Aortic arch: Great vessel origins are patent.

Right carotid system: No evidence of dissection, stenosis (50% or
greater) or occlusion.

Left carotid system: Atherosclerosis at the carotid bifurcation with
approximately 40% stenosis.

Vertebral arteries: Codominant. No evidence of dissection, stenosis
(50% or greater) or occlusion.

Skeleton: No acute fracture.

Other neck: No acute findings.

Upper chest: Opacities in the visualized lung apices, probably
atelectasis or aspiration.

Review of the MIP images confirms the above findings

CTA HEAD FINDINGS

Anterior circulation: Bilateral intracranial ICAs and MCAs are
patent. Severe proximal left M2 MCA branch stenosis. Bilateral ACAs
are patent. Severe right A2 ACA stenosis.

Posterior circulation: Bilateral intradural vertebral arteries and
basilar artery are patent with mild atherosclerotic narrowing.
Severe bilateral P1 PCA stenosis. Severe right P2 PCA stenosis.
Moderate left P2 PCA stenosis

Review of the MIP images confirms the above findings

CTV HEAD FINDINGS

No evidence of dural venous sinus thrombosis. The superior sagittal
sinus, straight, transverse and sigmoid sinuses are patent. The
visualized deep cerebral veins are patent. The jugular bulbs are
patent. Symmetric cavernous sinus opacification.
IMPRESSION: 1. Severe bilateral P1 and right P2 PCA stenoses. Redemonstrated
bilateral thalamic infarcts, better characterized on recent MRI and
probably related to artery of SARINA hypoperfusion. This vessel
is too small to characterize by CTA though.
2. Severe left proximal M2 MCA branch stenosis.
3. Severe right A2 ACA stenosis.
4. Approximately 40% stenosis at the left carotid bifurcation in the
neck.
5. No evidence of dural venous sinus thrombosis.
6. Opacities in the visualized lung apices, probably atelectasis or
aspiration.

## 2022-01-22 MED ORDER — IOHEXOL 350 MG/ML SOLN
100.0000 mL | Freq: Once | INTRAVENOUS | Status: AC | PRN
  Administered 2022-01-22: 100 mL via INTRAVENOUS

## 2022-01-22 MED ORDER — PERFLUTREN LIPID MICROSPHERE
1.0000 mL | INTRAVENOUS | Status: AC | PRN
  Administered 2022-01-22: 2 mL via INTRAVENOUS
  Filled 2022-01-22: qty 10

## 2022-01-22 MED ORDER — CLOPIDOGREL BISULFATE 75 MG PO TABS
75.0000 mg | ORAL_TABLET | Freq: Every day | ORAL | Status: DC
  Administered 2022-01-22 – 2022-01-26 (×5): 75 mg
  Filled 2022-01-22 (×6): qty 1

## 2022-01-22 MED ORDER — THIAMINE HCL 100 MG/ML IJ SOLN
250.0000 mg | INTRAVENOUS | Status: AC
  Administered 2022-01-23 – 2022-01-27 (×5): 250 mg via INTRAVENOUS
  Filled 2022-01-22 (×5): qty 2.5

## 2022-01-22 MED ORDER — THIAMINE HCL 100 MG PO TABS
100.0000 mg | ORAL_TABLET | Freq: Every day | ORAL | Status: DC
  Administered 2022-01-28 – 2022-03-17 (×49): 100 mg
  Filled 2022-01-22 (×49): qty 1

## 2022-01-22 NOTE — Progress Notes (Signed)
Patient transported from 4N23 to MRI and back with no complications. 

## 2022-01-22 NOTE — Progress Notes (Signed)
Patient transported on vent from CT to 4N23 without complications. ?

## 2022-01-22 NOTE — TOC CAGE-AID Note (Signed)
Transition of Care (TOC) - CAGE-AID Screening ? ? ?Patient Details  ?Name: Cindy Brooks ?MRN: [id number] ?Date of Birth: [birth year] ? ?Transition of Care (TOC) CM/SW Contact:    ?Nussen Pullin C Tarpley-Carter, LCSWA ?Phone Number: ?01/22/2022, 2:51 PM ? ? ?Clinical Narrative: ?Pt is unable to participate in Cage Aid. ?CSW will assess at a better time. ? ?Insurance underwriter, MSW, LCSW-A ?Pronouns:  She/Her/Hers ?[facility] of [facility] ?Clinical Social Worker ?Direct Number:  [phone] ?[email or web address] ? ?CAGE-AID Screening: ?Substance Abuse Screening unable to be completed due to: : Patient unable to participate ? ?  ?  ?  ?  ?  ? ?  ? ?  ? ? ? ? ? ? ?

## 2022-01-22 NOTE — Progress Notes (Signed)
? ?NAME:  Dion Cordner, MRN:  [id number], DOB:  11-24-1970, LOS: 2 ?ADMISSION DATE:  01/20/2022, CONSULTATION DATE: 01/20/2022 ?REFERRING MD: Triad, CHIEF COMPLAINT: Altered mental status ? ?History of Present Illness:  ?51 year old female who was found outside by family and was moved to the house and left for 5 hours at which time she did not move questional she had seizures therefore emergency medical services was activated.  She is transferred to [facility] emergency department near obtunded has extremely loud respirations copious secretions and difficulty controlling her airway.  She has been seen by neurology.  She admitted to the intensive care unit for further evaluation and treatment. ? ?Pertinent  Medical History  ? ?Past Medical History:  ?Diagnosis Date  ? Hypertension   ? TIA (transient ischemic attack) 2019  ? ? ? ?Significant Hospital Events: ?Including procedures, antibiotic start and stop dates in addition to other pertinent events   ? ? ?Interim History / Subjective:  ?No overnight issues. Discussed with neurology regarding concern for wernicke's encephalopathy ? ?Objective   ?Blood pressure (!) 144/92, pulse 67, temperature 97.7 ?F (36.5 ?C), temperature source Axillary, resp. rate 18, height 5\' 4"  (1.626 m), weight 82.1 kg, SpO2 100 %. ?   ?Vent Mode: PRVC ?FiO2 (%):  [30 %] 30 % ?Set Rate:  [18 bmp] 18 bmp ?Vt Set:  [440 mL] 440 mL ?PEEP:  [5 cmH20] 5 cmH20 ?Plateau Pressure:  [17 cmH20-19 cmH20] 18 cmH20  ? ?Intake/Output Summary (Last 24 hours) at 01/22/2022 0927 ?Last data filed at 01/22/2022 0700 ?Gross per 24 hour  ?Intake 2088.43 ml  ?Output 2025 ml  ?Net 63.43 ml  ? ?Filed Weights  ? 01/20/22 1140 01/21/22 0500 01/22/22 0500  ?Weight: 78.7 kg 80.9 kg 82.1 kg  ? ? ?Examination: ?Gen:      Intubated, sedated, acutely ill appearing ?HEENT:  ETT to vent ?Lungs:    sounds of mechanical ventilation auscultated no wheezes or crackles ?CV:        RRR no mrg ?Abd:      + bowel sounds; soft, non-tender;  no palpable masses, no distension ?Ext:    No edema ?Skin:      Warm and dry; no rashes ?Neuro:   sedated not responsive ? ? ? ?Resolved Hospital Problem list   ? ? ?Assessment & Plan:  ?Acute metabolic encephalopathy ?Diferential diagnosis includes stroke vs wernicke's encephalopathy ?Alcohol abuse and reported seizure activity ?Neurology is involved ?Plan is for treating with high dose thiamine for wernicke's. Obtaining at angio head as well as venogram.  ?Scheduled Phenobarbital for etoh withdrawal ?Precedex with prn fentanyl for sedation ?Thiamine ?Folic acid ?Keppra and AEDs per neurology ?EEG negative for seizures so far.  ? ?Acute Hypoxemic Respiratory Failure ?Intubated for encephalopathy - mental status barrier to extubation ?Maintain lung protective ventilation ?Daily SBT ? ?UTI ?Ceftriaxone will complete empiric 5 day course. ?Blood cultures negative. Urine cx growth insignificant. ? ?Hypertension ?Antihypertensives as needed goal SBP<140 ? ?Anemia ?Hgb returned to baseline 12-13.  ? ? ? ?Best Practice (right click and "Reselect all SmartList Selections" daily)  ? ?Diet/type: NPO with tube feeds ?DVT prophylaxis: LMWH ?GI prophylaxis: PPI ?Lines: N/A ?Foley:  N/A ?Code Status:  full code ?Last date of multidisciplinary goals of care discussion [tbd. Will update daughter today.] ? ?Labs   ?CBC: ?Recent Labs  ?Lab 01/20/22 ?0128 01/20/22 ?0148 01/20/22 ?1219 01/20/22 ?1536 01/21/22 ?0354 01/21/22 ?[id number] 01/22/22 ?0340  ?WBC 7.8  --  9.6  --   --  5.7 9.7  ?NEUTROABS 5.0  --  7.6  --   --   --  6.8  ?HGB 13.4   < > 10.7* 10.2* 9.9* 10.4* 12.1  ?HCT 40.9   < > 32.8* 30.0* 29.0* 30.3* 36.7  ?MCV 82.8  --  83.0  --   --  80.8 82.7  ?PLT 231  --  213  --   --  178 238  ? < > = values in this interval not displayed.  ? ? ?Basic Metabolic Panel: ?Recent Labs  ?Lab 01/20/22 ?0128 01/20/22 ?0148 01/20/22 ?0149 01/20/22 ?0700 01/20/22 ?1039 01/20/22 ?1219 01/20/22 ?1536 01/20/22 ?1553 01/21/22 ?0354 01/21/22 ?[id number]  01/21/22 ?1700 01/22/22 ?0340  ?NA 138   < > 138 138 138  --  141  --  143 142  --  143  ?K 4.0   < > 4.0 4.4 3.6  --  3.4* 3.7 3.7 3.8  --  4.3  ?CL 105  --  105 109  --   --   --   --   --  114*  --  113*  ?CO2 24  --   --  23  --   --   --   --   --  22  --  23  ?GLUCOSE 87  --  84 113*  --   --   --   --   --  149*  --  112*  ?BUN 10  --  11 10  --   --   --   --   --  6  --  11  ?CREATININE 0.83  --  0.70 0.72  --  0.76  --   --   --  0.65  --  0.92  ?CALCIUM 9.4  --   --  8.2*  --   --   --   --   --  8.4*  --  9.1  ?MG  --   --   --   --   --   --   --  1.8  --  1.8 1.8 2.1  ?PHOS  --   --   --   --   --   --   --  3.4  --  2.9 3.3 4.3  ? < > = values in this interval not displayed.  ? ?GFR: ?Estimated Creatinine Clearance: 75.9 mL/min (by C-G formula based on SCr of 0.92 mg/dL). ?Recent Labs  ?Lab 01/20/22 ?0128 01/20/22 ?1219 01/20/22 ?1549 01/21/22 ?[id number] 01/22/22 ?0340  ?PROCALCITON  --  <0.10  --   --   --   ?WBC 7.8 9.6  --  5.7 9.7  ?LATICACIDVEN 1.2 3.2* 3.3*  --   --   ? ? ?Liver Function Tests: ?Recent Labs  ?Lab 01/20/22 ?0128 01/20/22 ?0700 01/22/22 ?0340  ?AST 15 20 17   ?ALT 17 19 13   ?ALKPHOS 58 46 50  ?BILITOT 0.6 0.7 0.3  ?PROT 7.1 6.2* 7.4  ?ALBUMIN 3.7 3.0* 3.3*  ? ?No results for input(s): LIPASE, AMYLASE in the last 168 hours. ?Recent Labs  ?Lab 01/20/22 ?0128 01/20/22 ?0700  ?AMMONIA 28 49*  ? ? ?ABG ?   ?Component Value Date/Time  ? PHART 7.406 01/21/2022 0354  ? PCO2ART 35.0 01/21/2022 0354  ? PO2ART 84 01/21/2022 0354  ? HCO3 22.3 01/21/2022 0354  ? TCO2 23 01/21/2022 0354  ? ACIDBASEDEF 2.0 01/21/2022 0354  ? O2SAT 97 01/21/2022 0354  ?  ? ?Coagulation Profile: ?Recent  Labs  ?Lab 01/20/22 ?1219  ?INR 1.2  ? ? ?Cardiac Enzymes: ?No results for input(s): CKTOTAL, CKMB, CKMBINDEX, TROPONINI in the last 168 hours. ? ?HbA1C: ?No results found for: HGBA1C ? ?CBG: ?Recent Labs  ?Lab 01/21/22 ?1834 01/21/22 ?1940 01/21/22 ?2326 01/22/22 ?[id number] 01/22/22 ?[id number]  ?GLUCAP 85 71 139* 104* 116*   ? ? ? ?Critical care time:  ?  ? ?The patient is critically ill due to encephalopathy, respiratory failure.  Critical care was necessary to treat or prevent imminent or life-threatening deterioration.  Critical care was time spent personally by me on the following activities: development of treatment plan with patient and/or surrogate as well as nursing, discussions with consultants, evaluation of patient's response to treatment, examination of patient, obtaining history from patient or surrogate, ordering and performing treatments and interventions, ordering and review of laboratory studies, ordering and review of radiographic studies, pulse oximetry, re-evaluation of patient's condition and participation in multidisciplinary rounds.  ? ?Critical Care Time devoted to patient care services described in this note is 40 minutes. This time reflects time of care of this Bernard . This critical care time does not reflect separately billable procedures or procedure time, teaching time or supervisory time of PA/NP/Med student/Med Resident etc but could involve care discussion time.   ?   ? ?Spero Geralds ?[facility] ?01/22/2022 9:27 AM  ?Pager: see AMION ? ?If no response to pager , please call critical care on call (see AMION) until 7pm ?After 7:00 pm call Elink   ? ?

## 2022-01-22 NOTE — Progress Notes (Signed)
Carotid artery duplex has been completed. ?Preliminary results can be found in CV Proc through chart review.  ? ?01/22/22 3:14 PM ?Olen Cordial RVT   ?

## 2022-01-22 NOTE — Progress Notes (Addendum)
STROKE TEAM PROGRESS NOTE  ? ?INTERVAL HISTORY ?Patient is seen in her room with no family at the bedside.  On 4/8, she was found by her family to be unresponsive in her car.  She was moved to her couch, and when her family checked on her again on 4/9, she had not moved and EMS was called.  She was brought to the hospital and intubated for inability to protect her airways.  Workup for acute encephalopathy was started.  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  Multiple punctate acute ischemic infarcts were also seen in bilateral cerebral hemispheres.  CTA head and neck performed this morning shows severe bilateral P1 and right P2 stenoses as well as proximal left M2 stenosis and severe right ACA stenosis. ?Patient remains intubated and sedated with Precedex and as needed fentanyl and neurological exam is very poor with absent pupillary response with preserved corneals and weak cough and gag in place response to painful stimuli only. ?Vitals:  ? 01/22/22 1000 01/22/22 1100 01/22/22 1200 01/22/22 1300  ?BP: (!) 148/95 124/85 116/79 123/82  ?Pulse: 82 72 71 71  ?Resp: 18 18 18 18   ?Temp:   97.7 ?F (36.5 ?C)   ?TempSrc:   Axillary   ?SpO2: 97% 99% 100% 100%  ?Weight:      ?Height:      ? ?CBC:  ?Recent Labs  ?Lab 01/20/22 ?1219 01/20/22 ?1536 01/21/22 ?03/23/22 01/22/22 ?0340  ?WBC 9.6  --  5.7 9.7  ?NEUTROABS 7.6  --   --  6.8  ?HGB 10.7*   < > 10.4* 12.1  ?HCT 32.8*   < > 30.3* 36.7  ?MCV 83.0  --  80.8 82.7  ?PLT 213  --  178 238  ? < > = values in this interval not displayed.  ? ?Basic Metabolic Panel:  ?Recent Labs  ?Lab 01/21/22 ?03/23/22 01/21/22 ?1700 01/22/22 ?0340  ?NA 142  --  143  ?K 3.8  --  4.3  ?CL 114*  --  113*  ?CO2 22  --  23  ?GLUCOSE 149*  --  112*  ?BUN 6  --  11  ?CREATININE 0.65  --  0.92  ?CALCIUM 8.4*  --  9.1  ?MG 1.8 1.8 2.1  ?PHOS 2.9 3.3 4.3  ? ?Lipid Panel: No results for input(s): CHOL, TRIG, HDL, CHOLHDL,  VLDL, LDLCALC in the last 168 hours. ?HgbA1c: No results for input(s): HGBA1C in the last 168 hours. ?Urine Drug Screen:  ?Recent Labs  ?Lab 01/20/22 ?0351  ?LABOPIA NONE DETECTED  ?COCAINSCRNUR NONE DETECTED  ?LABBENZ NONE DETECTED  ?AMPHETMU NONE DETECTED  ?THCU NONE DETECTED  ?LABBARB NONE DETECTED  ?  ?Alcohol Level  ?Recent Labs  ?Lab 01/20/22 ?0249  ?ETH <10  ? ? ?IMAGING past 24 hours ?CT ANGIO HEAD NECK W WO CM ? ?Result Date: 01/22/2022 ?CLINICAL DATA:  Stroke, follow up; stroke EXAM: CT ANGIOGRAPHY HEAD AND NECK CT VENOGRAM TECHNIQUE: Multidetector CT imaging of the head and neck was performed using the standard protocol during bolus administration of intravenous contrast. Multiplanar CT image reconstructions and MIPs were obtained to evaluate the vascular anatomy. Carotid stenosis measurements (when applicable) are obtained utilizing NASCET criteria, using the distal internal carotid diameter as the denominator. Contiguous axial images were obtained from the base of the skull through the vertex during the bolus administration of intravenous contrast using CTV timing. Multiplanar CT image reconstructions and MIPs were obtained  to evaluate the venous anatomy. RADIATION DOSE REDUCTION: This exam was performed according to the departmental dose-optimization program which includes automated exposure control, adjustment of the mA and/or kV according to patient size and/or use of iterative reconstruction technique. CONTRAST:  OMNIPAQUE IOHEXOL 350 MG/ML SOLN COMPARISON:  None. FINDINGS: CT HEAD FINDINGS Brain: Infarcts in bilateral thalami, better characterized on recent MRI. Associated edema without significant mass effect. No acute hemorrhage, midline shift mass lesion, or extra-axial fluid collection. No hydrocephalus. Vascular: See below. Skull: No acute fracture. Sinuses: Mild paranasal sinus mucosal thickening. Orbits: No acute findings. Review of the MIP images confirms the above findings CTA NECK  FINDINGS Aortic arch: Great vessel origins are patent. Right carotid system: No evidence of dissection, stenosis (50% or greater) or occlusion. Left carotid system: Atherosclerosis at the carotid bifurcation with approximately 40% stenosis. Vertebral arteries: Codominant. No evidence of dissection, stenosis (50% or greater) or occlusion. Skeleton: No acute fracture. Other neck: No acute findings. Upper chest: Opacities in the visualized lung apices, probably atelectasis or aspiration. Review of the MIP images confirms the above findings CTA HEAD FINDINGS Anterior circulation: Bilateral intracranial ICAs and MCAs are patent. Severe proximal left M2 MCA branch stenosis. Bilateral ACAs are patent. Severe right A2 ACA stenosis. Posterior circulation: Bilateral intradural vertebral arteries and basilar artery are patent with mild atherosclerotic narrowing. Severe bilateral P1 PCA stenosis. Severe right P2 PCA stenosis. Moderate left P2 PCA stenosis Review of the MIP images confirms the above findings CTV HEAD FINDINGS No evidence of dural venous sinus thrombosis. The superior sagittal sinus, straight, transverse and sigmoid sinuses are patent. The visualized deep cerebral veins are patent. The jugular bulbs are patent. Symmetric cavernous sinus opacification. IMPRESSION: 1. 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 This vessel is too small to characterize by CTA though. 2. Severe left proximal M2 MCA branch stenosis. 3. Severe right A2 ACA stenosis. 4. Approximately 40% stenosis at the left carotid bifurcation in the neck. 5. No evidence of dural venous sinus thrombosis. 6. Opacities in the visualized lung apices, probably atelectasis or aspiration. Electronically Signed   By: Feliberto Harts M.D.   On: 01/22/2022 10:22  ? ?MR BRAIN WO CONTRAST ? ?Result Date:  01/22/2022 ?CLINICAL DATA:  Initial evaluation for neuro deficit, stroke suspected. EXAM: MRI HEAD WITHOUT CONTRAST TECHNIQUE: Multiplanar, multiecho pulse sequences of the brain and surrounding structures were obtained without intravenous contrast. COMPARISON:  CT from 01/20/2022. FINDINGS: Brain: Cerebral volume within normal limits. Patchy T2/FLAIR hyperintensity involving the periventricular deep white matter both cerebral hemispheres most consistent with chronic small vessel ischemic disease. Few scatter remote lacunar infarcts noted at the left basal ganglia, anterior genu of the corpus callosum, and pons. Patchy restricted diffusion involving both thalami in a fairly symmetric fashion is seen (series 9, image 94). Inferior extension into the central and left paramedian midbrain. Overall appearance is favored to reflect an acute ischemic infarct, likely an artery of Percheron infarct. No associated hemorrhage or significant regional mass effect. Multiple additional scattered punctate foci of diffusion abnormality involving both cerebral hemispheres likely reflects subtle acute to early subacute nonhemorrhagic ischemic infarcts as well. Otherwise, gray-white matter differentiation maintained. No acute intracranial hemorrhage. Multiple scattered chronic micro hemorrhages noted about the deep gray nuclei and cerebellum, likely related to chronic poorly controlled hypertension. No mass lesion or midline shift. No hydrocephalus or extra-axial fluid collection. Pituitary gland suprasellar region  normal. Midline structures intact. Vascular: Major intracranial vascular flow voids are maintained. No signal changes to suggest dural venous sinus thrombosis about the deep venous system visualized. Skull and upper cervical spine: Craniocervical junction within normal limits. Bone marrow signal intensity diffusely decreased on T1 weighted sequence, nonspecific, but most commonly related to anemia, smoking or obesity. No  focal marrow replacing lesion. No scalp soft tissue abnormality. Sinuses/Orbits: Globes orbital soft tissues demonstrate no acute finding. Scattered mucosal thickening noted throughout the paranasal sinuses. Fluid present w

## 2022-01-22 NOTE — Progress Notes (Addendum)
2D echocardiogram bubble study with Definity completed. ? ?01/22/2022 12:00 PM ?Eula Fried., MHA, RVT, RDCS, RDMS   ?

## 2022-01-23 ENCOUNTER — Other Ambulatory Visit: Payer: Self-pay

## 2022-01-23 LAB — GLUCOSE, CAPILLARY
Glucose-Capillary: 119 mg/dL — ABNORMAL HIGH (ref 70–99)
Glucose-Capillary: 118 mg/dL — ABNORMAL HIGH (ref 70–99)
Glucose-Capillary: 108 mg/dL — ABNORMAL HIGH (ref 70–99)
Glucose-Capillary: 124 mg/dL — ABNORMAL HIGH (ref 70–99)
Glucose-Capillary: 78 mg/dL (ref 70–99)
Glucose-Capillary: 123 mg/dL — ABNORMAL HIGH (ref 70–99)

## 2022-01-23 LAB — CBC WITH DIFFERENTIAL/PLATELET
Abs Immature Granulocytes: 0.03 10*3/uL (ref 0.00–0.07)
Basophils Absolute: 0 10*3/uL (ref 0.0–0.1)
Basophils Relative: 0 %
Eosinophils Absolute: 0.1 10*3/uL (ref 0.0–0.5)
Eosinophils Relative: 2 %
HCT: 35.2 % — ABNORMAL LOW (ref 36.0–46.0)
Hemoglobin: 11.4 g/dL — ABNORMAL LOW (ref 12.0–15.0)
Immature Granulocytes: 0 %
Lymphocytes Relative: 23 %
Lymphs Abs: 1.6 10*3/uL (ref 0.7–4.0)
MCH: 26.9 pg (ref 26.0–34.0)
MCHC: 32.4 g/dL (ref 30.0–36.0)
MCV: 83 fL (ref 80.0–100.0)
Monocytes Absolute: 0.5 10*3/uL (ref 0.1–1.0)
Monocytes Relative: 7 %
Neutro Abs: 4.7 10*3/uL (ref 1.7–7.7)
Neutrophils Relative %: 68 %
Platelets: 202 10*3/uL (ref 150–400)
RBC: 4.24 MIL/uL (ref 3.87–5.11)
RDW: 15.4 % (ref 11.5–15.5)
WBC: 7.1 10*3/uL (ref 4.0–10.5)
nRBC: 0 % (ref 0.0–0.2)

## 2022-01-23 LAB — BASIC METABOLIC PANEL
Anion gap: 7 (ref 5–15)
BUN: 11 mg/dL (ref 6–20)
CO2: 25 mmol/L (ref 22–32)
Calcium: 8.9 mg/dL (ref 8.9–10.3)
Chloride: 111 mmol/L (ref 98–111)
Creatinine, Ser: 0.75 mg/dL (ref 0.44–1.00)
GFR, Estimated: >60 mL/min (ref >60)
Glucose, Bld: 128 mg/dL — ABNORMAL HIGH (ref 70–99)
Potassium: 4.1 mmol/L (ref 3.5–5.1)
Sodium: 143 mmol/L (ref 135–145)

## 2022-01-23 LAB — VITAMIN A: Vitamin A (Retinoic Acid): 44.9 ug/dL (ref 20.1–62.0)

## 2022-01-23 LAB — VITAMIN B6: Vitamin B6: 4.6 ug/L (ref 3.4–65.2)

## 2022-01-23 LAB — VITAMIN B1: Vitamin B1 (Thiamine): 365.8 nmol/L — ABNORMAL HIGH (ref 66.5–200.0)

## 2022-01-23 MED ORDER — AMANTADINE HCL 50 MG/5ML PO SOLN
100.0000 mg | Freq: Two times a day (BID) | ORAL | Status: DC
  Administered 2022-01-23 – 2022-01-25 (×5): 100 mg
  Filled 2022-01-23 (×6): qty 10

## 2022-01-23 MED ORDER — FENTANYL CITRATE PF 50 MCG/ML IJ SOSY
50.0000 ug | PREFILLED_SYRINGE | INTRAMUSCULAR | Status: DC | PRN
  Administered 2022-01-23 – 2022-01-27 (×9): 100 ug via INTRAVENOUS
  Administered 2022-01-27 (×2): 50 ug via INTRAVENOUS
  Administered 2022-01-27: 100 ug via INTRAVENOUS
  Administered 2022-01-27 – 2022-01-28 (×2): 50 ug via INTRAVENOUS
  Administered 2022-01-28 (×2): 100 ug via INTRAVENOUS
  Administered 2022-01-28: 50 ug via INTRAVENOUS
  Administered 2022-01-29 – 2022-02-04 (×3): 100 ug via INTRAVENOUS
  Administered 2022-02-05 (×3): 50 ug via INTRAVENOUS
  Administered 2022-02-05: 100 ug via INTRAVENOUS
  Administered 2022-02-06: 50 ug via INTRAVENOUS
  Administered 2022-02-06 – 2022-02-07 (×3): 100 ug via INTRAVENOUS
  Administered 2022-02-07: 50 ug via INTRAVENOUS
  Administered 2022-02-07 (×3): 100 ug via INTRAVENOUS
  Administered 2022-02-08: 50 ug via INTRAVENOUS
  Administered 2022-02-08: 100 ug via INTRAVENOUS
  Administered 2022-02-08: 50 ug via INTRAVENOUS
  Administered 2022-02-08 – 2022-02-10 (×8): 100 ug via INTRAVENOUS
  Filled 2022-01-23: qty 1
  Filled 2022-01-23 (×2): qty 2
  Filled 2022-01-23: qty 1
  Filled 2022-01-23 (×3): qty 2
  Filled 2022-01-23: qty 1
  Filled 2022-01-23 (×3): qty 2
  Filled 2022-01-23: qty 1
  Filled 2022-01-23: qty 2
  Filled 2022-01-23: qty 1
  Filled 2022-01-23 (×6): qty 2
  Filled 2022-01-23: qty 1
  Filled 2022-01-23 (×15): qty 2
  Filled 2022-01-23: qty 1
  Filled 2022-01-23 (×5): qty 2
  Filled 2022-01-23: qty 1
  Filled 2022-01-23: qty 2
  Filled 2022-01-23: qty 1
  Filled 2022-01-23: qty 2

## 2022-01-23 MED ORDER — ROSUVASTATIN CALCIUM 20 MG PO TABS
40.0000 mg | ORAL_TABLET | Freq: Every day | ORAL | Status: DC
  Administered 2022-01-23 – 2022-03-17 (×54): 40 mg
  Filled 2022-01-23 (×54): qty 2

## 2022-01-23 NOTE — TOC CAGE-AID Note (Signed)
Transition of Care (TOC) - CAGE-AID Screening ? ? ?Patient Details  ?Name: Cindy Brooks ?MRN: [id number] ?Date of Birth: 1970-12-09 ? ?Transition of Care (TOC) CM/SW Contact:    ?Jakyri Brunkhorst C Tarpley-Carter, LCSWA ?Phone Number: ?01/23/2022, 11:15 AM ? ? ?Clinical Narrative: ?Pt is unable to participate in Cage Aid.  Pt is currently intubated and sedated.  CSW will attempt to assess at a better time. ? ?Insurance underwriter, MSW, LCSW-A ?Pronouns:  She/Her/Hers ?[facility] ?Clinical Social Worker ?Direct Number:  [phone] ?[email or web address] ? ?CAGE-AID Screening: ?Substance Abuse Screening unable to be completed due to: : Patient unable to participate ? ?  ?  ?  ?  ?  ? ?Substance Abuse Education Offered: No ? ?  ? ? ? ? ? ? ?

## 2022-01-23 NOTE — Progress Notes (Signed)
STROKE TEAM PROGRESS NOTE  ? ?INTERVAL HISTORY ?Patient is seen in her room with no family at the bedside.   Intubated and sedated.  She does get restless and agitated.  With mild stimulation.  No more seizures.  She remains on Keppra.  She is also on alcohol withdrawal precautions with phenobarbital and thiamine and folic acid for Wernicke's.  She is also on ceftriaxone 5-day course for UTI ?Vitals:  ? 01/23/22 0800 01/23/22 0900 01/23/22 0902 01/23/22 1000  ?BP: 132/82 (!) 195/104 (!) 166/92 (!) 141/88  ?Pulse: 71 (!) 102 94 75  ?Resp: 18 (!) 31 (!) 24 18  ?Temp: 98.3 ?F (36.8 ?C)     ?TempSrc: Axillary     ?SpO2: 98% 91% 97% 97%  ?Weight:      ?Height:      ? ?CBC:  ?Recent Labs  ?Lab 01/22/22 ?[id number] 01/23/22 ?[id number]  ?WBC 9.7 7.1  ?NEUTROABS 6.8 4.7  ?HGB 12.1 11.4*  ?HCT 36.7 35.2*  ?MCV 82.7 83.0  ?PLT 238 202  ? ?Basic Metabolic Panel:  ?Recent Labs  ?Lab 01/21/22 ?1700 01/22/22 ?[id number] 01/23/22 ?[id number]  ?NA  --  143 143  ?K  --  4.3 4.1  ?CL  --  113* 111  ?CO2  --  23 25  ?GLUCOSE  --  112* 128*  ?BUN  --  11 11  ?CREATININE  --  0.92 0.75  ?CALCIUM  --  9.1 8.9  ?MG 1.8 2.1  --   ?PHOS 3.3 4.3  --   ? ?Lipid Panel:  ?Recent Labs  ?Lab 01/22/22 ?0315  ?CHOL 225*  ?TRIG 181*  ?HDL 57  ?CHOLHDL 3.9  ?VLDL 36  ?[place] 132*  ? ?HgbA1c:  ?Recent Labs  ?Lab 01/22/22 ?0315  ?HGBA1C 5.7*  ? ?Urine Drug Screen:  ?Recent Labs  ?Lab 01/20/22 ?0351  ?LABOPIA NONE DETECTED  ?COCAINSCRNUR NONE DETECTED  ?LABBENZ NONE DETECTED  ?AMPHETMU NONE DETECTED  ?THCU NONE DETECTED  ?LABBARB NONE DETECTED  ?  ?Alcohol Level  ?Recent Labs  ?Lab 01/20/22 ?[id number]  ?ETH <10  ? ? ?IMAGING past 24 hours ?ECHOCARDIOGRAM COMPLETE BUBBLE STUDY ? ?Result Date: 01/22/2022 ?   ECHOCARDIOGRAM REPORT   Patient Name:   Cindy Brooks Date of Exam: 01/22/2022 Medical Rec #:  [id number]     Height:       64.0 in Accession #:    [id number]    Weight:       181.0 lb Date of Birth:  Apr 05, 1971    BSA:          1.875 m? Patient Age:    51 years      BP:            106/79 mmHg Patient Gender: F             HR:           73 bpm. Exam Location:  Inpatient Procedure: 2D Echo, Cardiac Doppler, Color Doppler, Saline Contrast Bubble Study            and Intracardiac Opacification Agent Indications:    Stroke  History:        Patient has no prior history of Echocardiogram examinations.                 Risk Factors:Diabetes, Dyslipidemia and Hypertension.  Sonographer:    [place], [place], RVT, RDCS Referring Phys: [id number] [facility]  Sonographer Comments: Technically challenging study due to limited acoustic windows, suboptimal apical window  and echo performed with patient supine and on artificial respirator. Image acquisition challenging due to patient body habitus. IMPRESSIONS  1. Left ventricular ejection fraction, by estimation, is 55 to 60%. The left ventricle has normal function. The left ventricle has no regional wall motion abnormalities. There is moderate concentric left ventricular hypertrophy. Left ventricular diastolic parameters are consistent with Grade I diastolic dysfunction (impaired relaxation).  2. Right ventricular systolic function is normal. The right ventricular size is normal. There is normal pulmonary artery systolic pressure.  3. Left atrial size was mildly dilated.  4. Small to moderate circumferential pericardial effusion. There is no evidence of cardiac tamponade.  5. The mitral valve is normal in structure. No evidence of mitral valve regurgitation. No evidence of mitral stenosis.  6. The aortic valve is normal in structure. Aortic valve regurgitation is not visualized. No aortic stenosis is present.  7. The inferior vena cava is normal in size with <50% respiratory variability, suggesting right atrial pressure of 8 mmHg.  8. Suboptimal agitated saline study with no obvious right to left shunt- negative study. Comparison(s): No prior Echocardiogram. FINDINGS  Left Ventricle: Left ventricular ejection fraction, by estimation, is 55 to  60%. The left ventricle has normal function. The left ventricle has no regional wall motion abnormalities. Definity contrast agent was given IV to delineate the left ventricular  endocardial borders. The left ventricular internal cavity size was normal in size. There is moderate concentric left ventricular hypertrophy. Left ventricular diastolic parameters are consistent with Grade I diastolic dysfunction (impaired relaxation). Right Ventricle: The right ventricular size is normal. No increase in right ventricular wall thickness. Right ventricular systolic function is normal. There is normal pulmonary artery systolic pressure. The tricuspid regurgitant velocity is 0.83 m/s, and  with an assumed right atrial pressure of 8 mmHg, the estimated right ventricular systolic pressure is 10.7 mmHg. Left Atrium: Left atrial size was mildly dilated. Right Atrium: Right atrial size was normal in size. Pericardium: Small to moderate circumferential pericardial effusion. The pericardial effusion appears to contain fibrous material. There is no evidence of cardiac tamponade. Presence of epicardial fat layer. Mitral Valve: The mitral valve is normal in structure. No evidence of mitral valve regurgitation. No evidence of mitral valve stenosis. Tricuspid Valve: The tricuspid valve is normal in structure. Tricuspid valve regurgitation is not demonstrated. No evidence of tricuspid stenosis. Aortic Valve: The aortic valve is normal in structure. Aortic valve regurgitation is not visualized. No aortic stenosis is present. Pulmonic Valve: The pulmonic valve was normal in structure. Pulmonic valve regurgitation is not visualized. No evidence of pulmonic stenosis. Aorta: The aortic root is normal in size and structure. Venous: The inferior vena cava is normal in size with less than 50% respiratory variability, suggesting right atrial pressure of 8 mmHg. IAS/Shunts: No atrial level shunt detected by color flow Doppler. Agitated saline  contrast was given intravenously to evaluate for intracardiac shunting. Suboptimal agitated saline study with no obvious right to left shunt- negative study.  LEFT VENTRICLE PLAX 2D LVIDd:         4.30 cm   Diastology LVIDs:         3.20 cm   LV e' medial:    3.64 cm/s LV PW:         1.50 cm   LV E/e' medial:  18.8 LV IVS:        1.40 cm   LV e' lateral:   3.37 cm/s LVOT diam:     2.10 cm  LV E/e' lateral: 20.3 LV SV:         57 LV SV Index:   31 LVOT Area:     3.46 cm?  RIGHT VENTRICLE RV S prime:     8.04 cm/s TAPSE (M-mode): 2.3 cm LEFT ATRIUM           Index        RIGHT ATRIUM           Index LA diam:      2.40 cm 1.28 cm/m?   RA Area:     14.90 cm? LA Vol (A2C): 28.1 ml 14.99 ml/m?  RA Volume:   38.20 ml  20.37 ml/m? LA Vol (A4C): 64.7 ml 34.50 ml/m?  AORTIC VALVE LVOT Vmax:   95.80 cm/s LVOT Vmean:  62.200 cm/s LVOT VTI:    0.166 m  AORTA Ao Root diam: 3.00 cm MITRAL VALVE               TRICUSPID VALVE MV Area (PHT): 3.87 cm?    TR Peak grad:   2.7 mmHg MV Decel Time: 196 msec    TR Vmax:        82.70 cm/s MV E velocity: 68.40 cm/s MV A velocity: 78.80 cm/s  SHUNTS MV E/A ratio:  0.87        Systemic VTI:  0.17 m                            Systemic Diam: 2.10 cm Kardie Tobb DO Electronically signed by Berniece Salines DO Signature Date/Time: 01/22/2022/4:50:01 PM    Final   ? ?VAS US CAROTID ? ?Result Date: 01/22/2022 ?Carotid Arterial Duplex Study Patient Name:  Cindy Brooks  Date of Exam:   01/22/2022 Medical Rec #: [id number]      Accession #:    [id number] Date of Birth: 1970-11-08     Patient Gender: F Patient Age:   28 years Exam Location:  [facility] Procedure:      VAS US CAROTID Referring Phys: Alferd Patee [facility] --------------------------------------------------------------------------------  Indications:       CVA. Risk Factors:      Hypertension. Limitations        Today's exam was limited due to the high bifurcation of the                    carotid, the patient's respiratory variation, patient  on a                    ventilator and patient positioning, patient constant                    coughing. Comparison Study:  No prior studies. Performing Technologist: Oliver Hum RVT  Examination [place]

## 2022-01-23 NOTE — Progress Notes (Signed)
? ?NAME:  Cindy Brooks, MRN:  [id number], DOB:  11/25/1970, LOS: 3 ?ADMISSION DATE:  01/20/2022, CONSULTATION DATE: 01/20/2022 ?REFERRING MD: [facility], CHIEF COMPLAINT: Altered mental status ? ?History of Present Illness:  ?51 year old female who was found outside by family and was moved to the house and left for 5 hours at which time she did not move questional she had seizures therefore emergency medical services was activated.  She is transferred to [facility] emergency department near obtunded has extremely loud respirations copious secretions and difficulty controlling her airway.  She has been seen by neurology.  She admitted to the intensive care unit for further evaluation and treatment. ? ?Pertinent  Medical History  ? ?Past Medical History:  ?Diagnosis Date  ? Hypertension   ? TIA (transient ischemic attack) 2019  ? ? ? ?Significant Hospital Events: ?Including procedures, antibiotic start and stop dates in addition to other pertinent events   ? ? ?Interim History / Subjective:  ?No overnight issues. Discussed with neurology regarding concern for wernicke's encephalopathy ? ?Objective   ?Blood pressure (!) 141/88, pulse 75, temperature 98.3 ?F (36.8 ?C), temperature source Axillary, resp. rate 18, height 5\' 4"  (1.626 m), weight 79.9 kg, SpO2 97 %. ?   ?Vent Mode: PRVC ?FiO2 (%):  [40 %] 40 % ?Set Rate:  [18 bmp] 18 bmp ?Vt Set:  [440 mL] 440 mL ?PEEP:  [5 cmH20] 5 cmH20 ?Plateau Pressure:  [17 cmH20-19 cmH20] 17 cmH20  ? ?Intake/Output Summary (Last 24 hours) at 01/23/2022 1031 ?Last data filed at 01/23/2022 1000 ?Gross per 24 hour  ?Intake 1749.54 ml  ?Output 1425 ml  ?Net 324.54 ml  ? ?Filed Weights  ? 01/21/22 0500 01/22/22 0500 01/23/22 0458  ?Weight: 80.9 kg 82.1 kg 79.9 kg  ? ? ?Examination: ?Gen:      Intubated, sedated, acutely ill appearing ?HEENT:  ETT to vent ?Lungs:    sounds of mechanical ventilation auscultated no wheezes or crackles ?CV:         RRR no mrg ?Abd:      + bowel sounds; soft, non-tender;  no palpable masses, no distension ?Ext:    No edema ?Skin:      Warm and dry; no rashes ?Neuro:   on precedex +cough and gag, unable to follow commands, responds to noxious stimuli ? ?Echo negative for vegetation ?CT venogram negative ?CT angio shows P1 and P2 PCA bilateral stenosis. Thalamic infarcts ? ? ? ?Resolved Hospital Problem list   ? ? ?Assessment & Plan:  ?Acute metabolic encephalopathy ?Diferential diagnosis includes stroke vs wernicke's encephalopathy ?Alcohol abuse and reported seizure activity ?Neurology is involved ?Continue high dose thiamine ?Scheduled Phenobarbital for etoh withdrawal ?Precedex with prn fentanyl for sedation ?Thiamine ?Folic acid ?Keppra and AEDs per neurology ?EEG negative for seizures so far.  ? ?Acute Hypoxemic Respiratory Failure ?Intubated for encephalopathy - mental status barrier to extubation ?Maintain lung protective ventilation ?Daily SBT ? ?UTI ?Ceftriaxone will complete empiric 5 day course. ?Blood cultures negative. Urine cx growth insignificant. ? ?Hypertension ?Antihypertensives as needed goal SBP<140 ? ?Anemia ?Hgb returned to baseline 12-13.  ? ?Alcohol Use Disorder ?- monitor for withdrawal signs and symptoms ? ?Best Practice (right click and "Reselect all SmartList Selections" daily)  ? ?Diet/type: NPO with tube feeds ?DVT prophylaxis: LMWH ?GI prophylaxis: PPI ?Lines: N/A ?Foley:  N/A ?Code Status:  full code ?Last date of multidisciplinary goals of care discussion [daughter updated at bedside 4/11] ? ?Labs   ?CBC: ?Recent Labs  ?Lab 01/20/22 ?0128 01/20/22 ?  0148 01/20/22 ?1219 01/20/22 ?1536 01/21/22 ?0354 01/21/22 ?[id number] 01/22/22 ?[id number] 01/23/22 ?[id number]  ?WBC 7.8  --  9.6  --   --  5.7 9.7 7.1  ?NEUTROABS 5.0  --  7.6  --   --   --  6.8 4.7  ?HGB 13.4   < > 10.7* 10.2* 9.9* 10.4* 12.1 11.4*  ?HCT 40.9   < > 32.8* 30.0* 29.0* 30.3* 36.7 35.2*  ?MCV 82.8  --  83.0  --   --  80.8 82.7 83.0  ?PLT 231  --  213  --   --  178 238 202  ? < > = values in this interval not  displayed.  ? ? ?Basic Metabolic Panel: ?Recent Labs  ?Lab 01/20/22 ?0128 01/20/22 ?0148 01/20/22 ?0149 01/20/22 ?0700 01/20/22 ?1039 01/20/22 ?1219 01/20/22 ?1536 01/20/22 ?1553 01/21/22 ?0354 01/21/22 ?[id number] 01/21/22 ?1700 01/22/22 ?[id number] 01/23/22 ?[id number]  ?NA 138   < > 138 138   < >  --  141  --  143 142  --  143 143  ?K 4.0   < > 4.0 4.4   < >  --  3.4* 3.7 3.7 3.8  --  4.3 4.1  ?CL 105  --  105 109  --   --   --   --   --  114*  --  113* 111  ?CO2 24  --   --  23  --   --   --   --   --  22  --  23 25  ?GLUCOSE 87  --  84 113*  --   --   --   --   --  149*  --  112* 128*  ?BUN 10  --  11 10  --   --   --   --   --  6  --  11 11  ?CREATININE 0.83  --  0.70 0.72  --  0.76  --   --   --  0.65  --  0.92 0.75  ?CALCIUM 9.4  --   --  8.2*  --   --   --   --   --  8.4*  --  9.1 8.9  ?MG  --   --   --   --   --   --   --  1.8  --  1.8 1.8 2.1  --   ?PHOS  --   --   --   --   --   --   --  3.4  --  2.9 3.3 4.3  --   ? < > = values in this interval not displayed.  ? ?GFR: ?Estimated Creatinine Clearance: 86.1 mL/min (by C-G formula based on SCr of 0.75 mg/dL). ?Recent Labs  ?Lab 01/20/22 ?0128 01/20/22 ?1219 01/20/22 ?1549 01/21/22 ?[id number] 01/22/22 ?[id number] 01/23/22 ?[id number]  ?PROCALCITON  --  <0.10  --   --   --   --   ?WBC 7.8 9.6  --  5.7 9.7 7.1  ?LATICACIDVEN 1.2 3.2* 3.3*  --   --   --   ? ? ?Liver Function Tests: ?Recent Labs  ?Lab 01/20/22 ?0128 01/20/22 ?0700 01/22/22 ?0340  ?AST 15 20 17   ?ALT 17 19 13   ?ALKPHOS 58 46 50  ?BILITOT 0.6 0.7 0.3  ?PROT 7.1 6.2* 7.4  ?ALBUMIN 3.7 3.0* 3.3*  ? ?No results for input(s): LIPASE, AMYLASE in the last 168 hours. ?Recent Labs  ?Lab 01/20/22 ?0128 01/20/22 ?0700  ?  AMMONIA 28 49*  ? ? ?ABG ?   ?Component Value Date/Time  ? PHART 7.406 01/21/2022 0354  ? PCO2ART 35.0 01/21/2022 0354  ? PO2ART 84 01/21/2022 0354  ? HCO3 22.3 01/21/2022 0354  ? TCO2 23 01/21/2022 0354  ? ACIDBASEDEF 2.0 01/21/2022 0354  ? O2SAT 97 01/21/2022 0354  ?  ? ?Coagulation Profile: ?Recent Labs  ?Lab  01/20/22 ?1219  ?INR 1.2  ? ? ?Cardiac Enzymes: ?No results for input(s): CKTOTAL, CKMB, CKMBINDEX, TROPONINI in the last 168 hours. ? ?HbA1C: ?Hgb A1c MFr Bld  ?Date/Time Value Ref Range Status  ?01/22/2022 03:15 AM 5.7 (H) 4.8 - 5.6 % Final  ?  Comment:  ?  (NOTE) ?Pre diabetes:          5.7%-6.4% ? ?Diabetes:              >6.4% ? ?Glycemic control for   <7.0% ?adults with diabetes ?  ? ? ?CBG: ?Recent Labs  ?Lab 01/22/22 ?1711 01/22/22 ?1929 01/22/22 ?2316 01/23/22 ?0328 01/23/22 ?0732  ?GLUCAP 125* 129* 131* 119* 118*  ? ? ? ?Critical care time:  ?  ? ?The patient is critically ill due to respiratory failure, encephalopathy.  Critical care was necessary to treat or prevent imminent or life-threatening deterioration.  Critical care was time spent personally by me on the following activities: development of treatment plan with patient and/or surrogate as well as nursing, discussions with consultants, evaluation of patient's response to treatment, examination of patient, obtaining history from patient or surrogate, ordering and performing treatments and interventions, ordering and review of laboratory studies, ordering and review of radiographic studies, pulse oximetry, re-evaluation of patient's condition and participation in multidisciplinary rounds.  ? ?Critical Care Time devoted to patient care services described in this note is 38 minutes. This time reflects time of care of this [place] . This critical care time does not reflect separately billable procedures or procedure time, teaching time or supervisory time of PA/NP/Med student/Med Resident etc but could involve care discussion time.   ?   ? ?Spero Geralds ?[facility] ?01/23/2022 10:33 AM  ?Pager: see AMION ? ?If no response to pager , please call critical care on call (see AMION) until 7pm ?After 7:00 pm call Elink   ? ? ? ?

## 2022-01-24 LAB — BASIC METABOLIC PANEL
Anion gap: 5 (ref 5–15)
BUN: 11 mg/dL (ref 6–20)
CO2: 28 mmol/L (ref 22–32)
Calcium: 8.7 mg/dL — ABNORMAL LOW (ref 8.9–10.3)
Chloride: 111 mmol/L (ref 98–111)
Creatinine, Ser: 0.76 mg/dL (ref 0.44–1.00)
GFR, Estimated: >60 mL/min (ref >60)
Glucose, Bld: 128 mg/dL — ABNORMAL HIGH (ref 70–99)
Potassium: 4 mmol/L (ref 3.5–5.1)
Sodium: 144 mmol/L (ref 135–145)

## 2022-01-24 LAB — GLUCOSE, CAPILLARY
Glucose-Capillary: 181 mg/dL — ABNORMAL HIGH (ref 70–99)
Glucose-Capillary: 156 mg/dL — ABNORMAL HIGH (ref 70–99)
Glucose-Capillary: 107 mg/dL — ABNORMAL HIGH (ref 70–99)
Glucose-Capillary: 84 mg/dL (ref 70–99)
Glucose-Capillary: 119 mg/dL — ABNORMAL HIGH (ref 70–99)
Glucose-Capillary: 135 mg/dL — ABNORMAL HIGH (ref 70–99)

## 2022-01-24 LAB — CBC
HCT: 31.6 % — ABNORMAL LOW (ref 36.0–46.0)
Hemoglobin: 10.3 g/dL — ABNORMAL LOW (ref 12.0–15.0)
MCH: 27.1 pg (ref 26.0–34.0)
MCHC: 32.6 g/dL (ref 30.0–36.0)
MCV: 83.2 fL (ref 80.0–100.0)
Platelets: 231 10*3/uL (ref 150–400)
RBC: 3.8 MIL/uL — ABNORMAL LOW (ref 3.87–5.11)
RDW: 15.1 % (ref 11.5–15.5)
WBC: 8.5 10*3/uL (ref 4.0–10.5)
nRBC: 0 % (ref 0.0–0.2)

## 2022-01-24 MED ORDER — FUROSEMIDE 10 MG/ML IJ SOLN
40.0000 mg | Freq: Once | INTRAMUSCULAR | Status: AC
  Administered 2022-01-24: 40 mg via INTRAVENOUS
  Filled 2022-01-24: qty 4

## 2022-01-24 NOTE — Progress Notes (Addendum)
STROKE TEAM PROGRESS NOTE  ? ?INTERVAL HISTORY ?Patient is seen in her room with her daughters at the bedside.  She has been hemodynamically stable and has had no acute events overnight.  Her neurological exam is stable.  Phenobarbital taper for ETOH withdrawal continues.  Trial of amantadine continues, will consider trying Provigil if this is ineffective.  Patient was unable to be weaned off ventilatory support and remains critically ill.  Discussed with 2 daughters at the bedside and answered questions ?Vitals:  ? 01/24/22 1000 01/24/22 1100 01/24/22 1200 01/24/22 1300  ?BP: (!) 145/85 (!) 153/97 (!) 147/94 (!) 156/91  ?Pulse: 74 91 (!) 102 (!) 109  ?Resp: 19 20 16 19   ?Temp:   99.2 ?F (37.3 ?C)   ?TempSrc:   Axillary   ?SpO2: 100% 97% 96% 96%  ?Weight:      ?Height:      ? ?CBC:  ?Recent Labs  ?Lab 01/22/22 ?[id number] 01/23/22 ?[id number] 01/24/22 ?0908  ?WBC 9.7 7.1 8.5  ?NEUTROABS 6.8 4.7  --   ?HGB 12.1 11.4* 10.3*  ?HCT 36.7 35.2* 31.6*  ?MCV 82.7 83.0 83.2  ?PLT 238 202 231  ? ? ?Basic Metabolic Panel:  ?Recent Labs  ?Lab 01/21/22 ?1700 01/22/22 ?[id number] 01/23/22 ?[id number] 01/24/22 ?0908  ?NA  --  143 143 144  ?K  --  4.3 4.1 4.0  ?CL  --  113* 111 111  ?CO2  --  23 25 28   ?GLUCOSE  --  112* 128* 128*  ?BUN  --  11 11 11   ?CREATININE  --  0.92 0.75 0.76  ?CALCIUM  --  9.1 8.9 8.7*  ?MG 1.8 2.1  --   --   ?PHOS 3.3 4.3  --   --   ? ? ?Lipid Panel:  ?Recent Labs  ?Lab 01/22/22 ?0315  ?CHOL 225*  ?TRIG 181*  ?HDL 57  ?CHOLHDL 3.9  ?VLDL 36  ?[place] 132*  ? ? ?HgbA1c:  ?Recent Labs  ?Lab 01/22/22 ?0315  ?HGBA1C 5.7*  ? ? ?Urine Drug Screen:  ?Recent Labs  ?Lab 01/20/22 ?0351  ?LABOPIA NONE DETECTED  ?COCAINSCRNUR NONE DETECTED  ?LABBENZ NONE DETECTED  ?AMPHETMU NONE DETECTED  ?THCU NONE DETECTED  ?LABBARB NONE DETECTED  ? ?  ?Alcohol Level  ?Recent Labs  ?Lab 01/20/22 ?[id number]  ?ETH <10  ? ? ? ?IMAGING past 24 hours ?No results found. ? ?PHYSICAL EXAM ?General:  Intubated, obese middle-aged African-American patient in no acute  distress ?Respiratory:  Respirations synchronous with ventilator ? Afebrile. Head is nontraumatic. Neck is supple  ?Neurological Exam :  ?Sedation held this AM.  Intubated.  Eyes are closed.  She does not respond to tactile or verbal stimuli but with sternal rub she gets slightly agitated . ?Pupils 48mm bilaterally and nonreactive, corneal reflexes intact, oculocephalic reflex absent, cough and gag reflexes present.  Will not follow commands but grimaces and moves all extremities to sternal rub.  No spontaneous extremity movements noted.  Plantars both mute. ? ?ASSESSMENT/PLAN ?Ms. Syanna Conover is a 51 y.o. female with history of HTN, TIA, ETOH abuse and medication noncompliance presenting after she was found by her family to be unresponsive in her car on 4/8.  She was moved to her couch, and when her family checked on her again on 4/9, she had not moved and EMS was called.  She was brought to the hospital and intubated for inability to protect her airways.  Workup for acute encephalopathy was started.  MRI brain demonstrates changes  in the midbrain and thalami concerning for artery of Percheron infarct vs. Wernicke's encephalopathy.  Multiple punctate acute ischemic infarcts were also seen in bilateral cerebral hemispheres.  CTA head and neck performed this morning shows severe bilateral P1 and right P2 stenoses as well as proximal left M2 stenosis and severe right ACA stenosis. ? 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 MRI with multiple punctate acute ischemic infarcts in bilateral hemispheres:  secondary to multifocal intracranial stenosis and possible embolic source in patient who was found down after a significant amount of time ?CT head No acute abnormality.  ?CTA head & neck severe bilateral P1 and right P2 stenoses as well as proximal left M2 stenosis and severe right ACA stenosis, 40% stenosis of left ICA ?CTV no  dural venous sinus thrombosis ?MRI  Patchy restricted diffusion in bilateral thalami and midbrain with multiple punctate acute ischemic infarcts in bilateral cerebral hemispheres and multiple chronic microhemorrhages ?2D Echo ejection fraction 55 to 60%.  Left atrium mildly dilated. ?LDL 132 ?HgbA1c 5.7 ?VTE prophylaxis - SCDs ?   ?Diet  ? Diet NPO time specified  ? ?No antithrombotic prior to admission, now on clopidogrel 75 mg daily. (Aspirin allergy) ?Therapy recommendations:  pending ?Disposition:  pending ? ?Concern for Wernicke's encephalopathy ?Patient has a history of heavy alcohol use ?MRI shows DWI changes in bilateral thalami and midbrain  ( versus changes from severe wernicke`s encephalopathy less likely ) ?Thiamine 500 mg IV q8h ? ?Hypertension ?Home meds:  none known ?Stable ?Permissive hypertension (OK if < 220/120) but gradually normalize in 5-7 days ?Long-term BP goal normotensive ? ?Hyperlipidemia ?Home meds:  none ?LDL 132, goal < 70 ?Rosuvastatin 40 mg daily ? ?Risk for Diabetes type II  ?Home meds:  none ?HgbA1c 5.7, goal < 7.0 ?CBGs ?Recent Labs  ?  01/24/22 ?[id number] 01/24/22 ?0727 01/24/22 ?1105  ?GLUCAP 181* 156* 107*  ? ?  ?SSI ? ?Respiratory failure ?Patient was intubated for inability to protect airway ?Ventilator management per CCM ? ?Other Stroke Risk Factors ?ETOH use, alcohol level <10, will advise to drink no more than 1 drink a day ?Obesity, Body mass index is 30.24 kg/m?., BMI >/= 30 associated with increased stroke risk, recommend weight loss, diet and exercise as appropriate  ?Hx TIA ? ?Other Active Problems ?Suspected seizures ?Question of seizures prior arrival ?LTM EEG revealed no seizures ? ?Risk for ETOH withdrawal ?Patient has history of heavy alcohol use ?ETOH level <10 on admission ?Phenobarbital taper per CCM ? ?Hospital day # 4 ? ?Viola , MSN, AGACNP-BC ?[facility] ?See Amion for schedule and pager information ?01/24/2022 1:50 PM ?  ?I have  personally obtained history,examined this patient, reviewed notes, independently viewed imaging studies, participated in medical decision making and plan of care.ROS completed by me personally and pertinent positives fully documented  I have made any additions or clarifications directly to the above note. Agree with note above.  Patient sedation was held this morning but she is not shown any increased responsiveness.  She is also being on amantadine for 2 days.  Recommend continue to hold on minimize sedation if possible to look for any wakefulness.  May consider switching amantadine to Provigil if no response by tomorrow.  Long discussion with 2 daughters at the bedside and answered questions about her prognosis.  She is unlikely to survive without prolonged ventilatory support at May need to consider tracheostomy early next week  if patient does not she will need significant meaningful improvement over the next few days and coming weekend.  Discussed with critical care team.This patient is critically ill and at significant risk of neurological worsening, death and care requires constant monitoring of vital signs, hemodynamics,respiratory and cardiac monitoring, extensive review of multiple databases, frequent neurological assessment, discussion with family, other specialists and medical decision making of high complexity.I have made any additions or clarifications directly to the above note.This critical care time does not reflect procedure time, or teaching time or supervisory time of PA/NP/Med Resident etc but could involve care discussion time. ? I spent 35 minutes of neurocritical care time  in the care of  this patient. ? ?  ? ?Antony Contras, MD ?Medical Director ?[facility] ?Pager: [phone] ?01/24/2022 2:42 PM ? ? ?To contact Stroke Continuity provider, please refer to [email or web address] ?After hours, contact General Neurology  ?

## 2022-01-24 NOTE — Progress Notes (Signed)
? ?NAME:  Cindy Brooks, MRN:  [id number], DOB:  1971-02-24, LOS: 4 ?ADMISSION DATE:  01/20/2022, CONSULTATION DATE: 01/20/2022 ?REFERRING MD: [facility], CHIEF COMPLAINT: Altered mental status ? ?History of Present Illness:  ?51 year old female who was found outside by family and was moved to the house and left for 5 hours at which time she did not move. Question if she had seizures therefore emergency medical services was activated.  She is transferred to [facility] emergency department near obtunded has extremely loud respirations copious secretions and difficulty controlling her airway.  She has been seen by neurology.  She admitted to the intensive care unit for further evaluation and treatment. ? ?Pertinent  Medical History  ?HTN  ?TIA ? ?Significant Hospital Events: ?Including procedures, antibiotic start and stop dates in addition to other pertinent events   ?4/9 Admit  ?4/12 ongoing high dose thiamine for possible Wernike's encephalopathy  ? ?Interim History / Subjective:  ?Afebrile  ?Vent - 40%, PEEP 5 ?Glucose range 123-181  ?I/O 1.8L UOP, +212 ml in last 24 hours  ? ?Objective   ?Blood pressure 135/89, pulse 71, temperature 98.1 ?F (36.7 ?C), temperature source Axillary, resp. rate 18, height 5\' 4"  (1.626 m), weight 79.9 kg, SpO2 100 %. ?   ?Vent Mode: PRVC ?FiO2 (%):  [40 %] 40 % ?Set Rate:  [18 bmp] 18 bmp ?Vt Set:  [440 mL] 440 mL ?PEEP:  [5 cmH20] 5 cmH20 ?Plateau Pressure:  [18 cmH20-20 cmH20] 20 cmH20  ? ?Intake/Output Summary (Last 24 hours) at 01/24/2022 0735 ?Last data filed at 01/24/2022 0700 ?Gross per 24 hour  ?Intake 2062.44 ml  ?Output 1850 ml  ?Net 212.44 ml  ? ?Filed Weights  ? 01/21/22 0500 01/22/22 0500 01/23/22 0458  ?Weight: 80.9 kg 82.1 kg 79.9 kg  ? ? ?Examination: ?General: critically ill adult female lying in bed in NAD on vent  ?HEENT: MM pink/moist, ETT, pupils 80mm non-reactive ?Neuro: No response to verbal stimuli, +cough/gag, +corneal response, no withdrawal to pain  ?CV: s1s2 RRR, no  m/r/g ?PULM: non-labored at rest, lungs clear bilaterally  ?GI: soft, bsx4 active  ?Extremities: warm/dry, 1-2+ edema  ?Skin: no rashes or lesions ? ?Resolved Hospital Problem list   ? ? ?Assessment & Plan:  ? ?Acute metabolic encephalopathy ?Diferential diagnosis includes stroke vs wernicke's encephalopathy ?Alcohol abuse and reported seizure activity ?EEG negative for seizure ?-appreciate Neurology evaluation  ?-continue high dose thiamine ?-plan per Neuro is to reassess MRI in two weeks to determine if CVA vs Wernike's ?-will need early trach  ?-continue Phenobarbital wean ?-stop precedex  ?-PRN fentanyl  ?-folic acid  ?-Keppra/anti-epileptics per Neuro  ?-seizure precautions ? ?Acute Hypoxemic Respiratory Failure ?Intubated for encephalopathy - mental status barrier to extubation ?-PRVC 8cc/kg ?-daily SBT but mental status remains barrier for extubation  ? ?UTI ?Blood cultures negative, UC with insignificant growth  ?-ceftriaxone D5/5 ? ?Hypertension ?-SBP goal <140  ? ?Anemia ?Hgb baseline 12-13.  ?-follow CBC  ?-transfuse for Hgb <7% ? ?Alcohol Use Disorder ?-phenobarbital wean  ?-monitor for evidence of withdrawal ? ?Best Practice (right click and "Reselect all SmartList Selections" daily)  ?Diet/type: NPO with tube feeds ?DVT prophylaxis: LMWH ?GI prophylaxis: PPI ?Lines: N/A ?Foley:  N/A ?Code Status:  full code ?Last date of multidisciplinary goals of care discussion: daughters (2 in person, one via phone) updated at bedside 4/13 on plan of care.  Reviewed potential need for trach.  Will plan for early next week.  Discussed long term of implications of stroke, trach,  and high level care needs.  Family would like to pursue trach.  ? ?Critical care time: 35 minutes  ? ?Canary Brim, MSN, APRN, NP-C, AGACNP-BC ?[facility] ?01/24/2022, 7:35 AM ? ? ?Please see [email or web address] for pager details.  ? ?From 7A-7P if no response, please call [phone] ?After hours, please call Pola Corn  [phone] ? ? ? ? ?

## 2022-01-24 NOTE — Progress Notes (Signed)
Nutrition Follow-up ? ?DOCUMENTATION CODES:  ? ?Not applicable ? ?INTERVENTION:  ? ?Tube feeds via OG tube: ?- Vital 1.5 @ 55 ml/hr (1320 ml/day) ?- ProSource TF 45 ml daily ? ?Tube feeding regimen provides 2020 kcal, 100 grams of protein, and 1008 ml of H2O. ? ?- Continue MVI with minerals daily per tube ? ?NUTRITION DIAGNOSIS:  ? ?Inadequate oral intake related to inability to eat as evidenced by NPO status. ?Ongoing ? ?GOAL:  ? ?Patient will meet greater than or equal to 90% of their needs ?Met with TF at goal  ? ?MONITOR:  ? ?Vent status, Labs, TF tolerance, I & O's ? ?REASON FOR ASSESSMENT:  ? ?Ventilator, Consult ?Enteral/tube feeding initiation and management ? ?ASSESSMENT:  ? ?51-year-old female who presented to the ED on 4/09 with AMS. PMH of EtOH abuse, HTN, TIA, medication noncompliance. Pt admitted with acute encephalopathy. ? ?Pt on high dose thiamine for possible Wernicke's  ? ?04/09 - pt intubated for airway protection ? ? ?Admit weight: 78.7 kg (BMI 29.8) ?Current weight: 80.9 kg ? ?Patient is currently intubated on ventilator support ?MV: 7.8 L/min ?Temp (24hrs), Avg:98.5 ?F (36.9 ?C), Min:98.1 ?F (36.7 ?C), Max:98.8 ?F (37.1 ?C) ? ?Drips: ?Precedex ? ?Medications reviewed and include: colace, folic acid, MVI with minerals daily, protonix, phenobarbital taper, miralax, IV abx, IV thiamine ? ?Vitamin/Mineral Profile: ?Vitamin B6: 4.6 (WNL) ?Vitamin B12: 930 (H) ?Vitamin A: 44.9 (WNL) ?Zinc: 69 (WNL)  ?CRP: 7.6 (H) ?Vitamin B1: 365.8 (4/9) ? ?Labs reviewed: Ammonia: 49 (4/9) ?CBG's: 78-181 x 24 hours ? ?UOP: 1850 ml x 24 hours ?I/O's: +4.6 L since admit ? ?16 F OG tube; per xray within stomach but tip not visualized  ? ?Diet Order:   ?Diet Order   ? ?       ?  Diet NPO time specified  Diet effective now       ?  ? ?  ?  ? ?  ? ? ?EDUCATION NEEDS:  ? ?No education needs have been identified at this time ? ?Skin:  Skin Assessment: Reviewed RN Assessment ? ?Last BM:  4/10 medium ? ?Height:  ? ?Ht  Readings from Last 1 Encounters:  ?01/20/22 5' 4" (1.626 m)  ? ? ?Weight:  ? ?Wt Readings from Last 1 Encounters:  ?01/23/22 79.9 kg  ? ? ?BMI:  Body mass index is 30.24 kg/m?. ? ?Estimated Nutritional Needs:  ? ?Kcal:  1800-2000 ? ?Protein:  90-110 grams ? ?Fluid:  1.8-2.0 L ? ? ?Heather P., RD, LDN, CNSC ?See AMiON for contact information  ? ? ?

## 2022-01-24 NOTE — Progress Notes (Signed)
Because of excessive edema in pt's hands, 2 rings were removed from L & R ring fingers. They are in a labeled pink denture cup and will be given to daughter when she arrives today. ?Leoda Smithhart C ?8:49 AM ? ?

## 2022-01-24 NOTE — TOC CAGE-AID Note (Signed)
Transition of Care (TOC) - CAGE-AID Screening ? ? ?Patient Details  ?Name: Cindy Brooks ?MRN: [id number] ?Date of Birth: 1971/07/21 ? ?Transition of Care (TOC) CM/SW Contact:    ?Dannilynn Gallina C Tarpley-Carter, LCSWA ?Phone Number: ?01/24/2022, 10:04 AM ? ? ?Clinical Narrative: ?Pt is unable to participate in Cage Aid. ?Pt is still critically ill.  CSW will continue to attempt to assess. ? ?Insurance underwriter, MSW, LCSW-A ?Pronouns:  She/Her/Hers ?[facility]itions of [facility] ?Clinical Social Worker ?Direct Number:  [phone] ?[email or web address] ? ?CAGE-AID Screening: ?Substance Abuse Screening unable to be completed due to: : Patient unable to participate ? ?  ?  ?  ?  ?  ? ?Substance Abuse Education Offered: No ? ?  ? ? ? ? ? ? ?

## 2022-01-24 NOTE — Progress Notes (Addendum)
CDS called. Spoke with Barbette Or, Referral [phone] ? ?Christina Waldrop C ? ? ?Rings given to daughter, Caryn Bee, when at bedside. ?Hashim Eichhorst C ? ?

## 2022-01-25 DIAGNOSIS — I6312 Cerebral infarction due to embolism of basilar artery: Secondary | ICD-10-CM

## 2022-01-25 LAB — CBC
HCT: 35.3 % — ABNORMAL LOW (ref 36.0–46.0)
Hemoglobin: 11.6 g/dL — ABNORMAL LOW (ref 12.0–15.0)
MCH: 27 pg (ref 26.0–34.0)
MCHC: 32.9 g/dL (ref 30.0–36.0)
MCV: 82.3 fL (ref 80.0–100.0)
Platelets: 280 10*3/uL (ref 150–400)
RBC: 4.29 MIL/uL (ref 3.87–5.11)
RDW: 15.3 % (ref 11.5–15.5)
WBC: 10.2 10*3/uL (ref 4.0–10.5)
nRBC: 0 % (ref 0.0–0.2)

## 2022-01-25 LAB — GLUCOSE, CAPILLARY
Glucose-Capillary: 118 mg/dL — ABNORMAL HIGH (ref 70–99)
Glucose-Capillary: 118 mg/dL — ABNORMAL HIGH (ref 70–99)
Glucose-Capillary: 160 mg/dL — ABNORMAL HIGH (ref 70–99)
Glucose-Capillary: 126 mg/dL — ABNORMAL HIGH (ref 70–99)
Glucose-Capillary: 148 mg/dL — ABNORMAL HIGH (ref 70–99)

## 2022-01-25 LAB — CULTURE, BLOOD (ROUTINE X 2)
Culture: NO GROWTH
Culture: NO GROWTH

## 2022-01-25 LAB — BASIC METABOLIC PANEL
Anion gap: 8 (ref 5–15)
BUN: 15 mg/dL (ref 6–20)
CO2: 27 mmol/L (ref 22–32)
Calcium: 8.7 mg/dL — ABNORMAL LOW (ref 8.9–10.3)
Chloride: 107 mmol/L (ref 98–111)
Creatinine, Ser: 0.84 mg/dL (ref 0.44–1.00)
GFR, Estimated: >60 mL/min (ref >60)
Glucose, Bld: 139 mg/dL — ABNORMAL HIGH (ref 70–99)
Potassium: 4.3 mmol/L (ref 3.5–5.1)
Sodium: 142 mmol/L (ref 135–145)

## 2022-01-25 MED ORDER — MODAFINIL 100 MG PO TABS
100.0000 mg | ORAL_TABLET | Freq: Every day | ORAL | Status: DC
  Administered 2022-01-25 – 2022-01-30 (×6): 100 mg
  Filled 2022-01-25 (×6): qty 1

## 2022-01-25 MED ORDER — HYDRALAZINE HCL 20 MG/ML IJ SOLN
25.0000 mg | Freq: Four times a day (QID) | INTRAMUSCULAR | Status: DC | PRN
  Administered 2022-01-28: 25 mg via INTRAVENOUS
  Filled 2022-01-25: qty 2

## 2022-01-25 MED ORDER — LABETALOL HCL 5 MG/ML IV SOLN
40.0000 mg | INTRAVENOUS | Status: DC | PRN
  Administered 2022-01-27: 20 mg via INTRAVENOUS
  Filled 2022-01-25: qty 8

## 2022-01-25 MED ORDER — FUROSEMIDE 10 MG/ML IJ SOLN
40.0000 mg | Freq: Once | INTRAMUSCULAR | Status: AC
  Administered 2022-01-25: 40 mg via INTRAVENOUS
  Filled 2022-01-25: qty 4

## 2022-01-25 NOTE — TOC CAGE-AID Note (Signed)
Transition of Care (TOC) - CAGE-AID Screening ? ? ?Patient Details  ?Name: Cindy Brooks ?MRN: [id number] ?Date of Birth: [birth year] ? ?Transition of Care (TOC) CM/SW Contact:    ?Jonthan Leite C Tarpley-Carter, LCSWA ?Phone Number: ?01/25/2022, 12:46 PM ? ? ?Clinical Narrative: ?Pt is unable to participate in Cage Aid. ?Pt is critically ill. ? ?Passenger transport manager, MSW, LCSW-A ?Pronouns:  She/Her/Hers ?[facility] ?Clinical Social Worker ?Direct Number:  [phone] ?[email or web address] ? ?CAGE-AID Screening: ?Substance Abuse Screening unable to be completed due to: : Patient unable to participate ? ?  ?  ?  ?  ?  ? ?Substance Abuse Education Offered: No ? ?  ? ? ? ? ? ? ?

## 2022-01-25 NOTE — Progress Notes (Addendum)
STROKE TEAM PROGRESS NOTE  ? ?INTERVAL HISTORY ?Patient is seen in her room with no family members at the bedside.  She has been hemodynamically stable and has had no acute events overnight.  Her neurological exam is unchanged.  Will stop trial of amantadine today as it has been ineffective and instead start Provigil.  ?Vitals:  ? 01/25/22 1000 01/25/22 1130 01/25/22 1200 01/25/22 1230  ?BP: (!) 142/80 133/81 (!) 149/103 130/79  ?Pulse: 93 94 (!) 110 95  ?Resp: 19 18 19 18   ?Temp:   97.9 ?F (36.6 ?C)   ?TempSrc:   Axillary   ?SpO2: 99% 100% 100% 96%  ?Weight:      ?Height:      ? ?CBC:  ?Recent Labs  ?Lab 01/22/22 ?[id number] 01/23/22 ?[id number] 01/24/22 ?[id number] 01/25/22 ?0409  ?WBC 9.7 7.1 8.5 10.2  ?NEUTROABS 6.8 4.7  --   --   ?HGB 12.1 11.4* 10.3* 11.6*  ?HCT 36.7 35.2* 31.6* 35.3*  ?MCV 82.7 83.0 83.2 82.3  ?PLT 238 202 231 280  ? ? ?Basic Metabolic Panel:  ?Recent Labs  ?Lab 01/21/22 ?1700 01/22/22 ?[id number] 01/23/22 ?[id number] 01/24/22 ?[id number] 01/25/22 ?0409  ?NA  --  143   < > 144 142  ?K  --  4.3   < > 4.0 4.3  ?CL  --  113*   < > 111 107  ?CO2  --  23   < > 28 27  ?GLUCOSE  --  112*   < > 128* 139*  ?BUN  --  11   < > 11 15  ?CREATININE  --  0.92   < > 0.76 0.84  ?CALCIUM  --  9.1   < > 8.7* 8.7*  ?MG 1.8 2.1  --   --   --   ?PHOS 3.3 4.3  --   --   --   ? < > = values in this interval not displayed.  ? ? ?Lipid Panel:  ?Recent Labs  ?Lab 01/22/22 ?0315  ?CHOL 225*  ?TRIG 181*  ?HDL 57  ?CHOLHDL 3.9  ?VLDL 36  ?[place] 132*  ? ? ?HgbA1c:  ?Recent Labs  ?Lab 01/22/22 ?0315  ?HGBA1C 5.7*  ? ? ?Urine Drug Screen:  ?Recent Labs  ?Lab 01/20/22 ?0351  ?LABOPIA NONE DETECTED  ?COCAINSCRNUR NONE DETECTED  ?LABBENZ NONE DETECTED  ?AMPHETMU NONE DETECTED  ?THCU NONE DETECTED  ?LABBARB NONE DETECTED  ? ?  ?Alcohol Level  ?Recent Labs  ?Lab 01/20/22 ?[id number]  ?ETH <10  ? ? ? ?IMAGING past 24 hours ?No results found. ? ?PHYSICAL EXAM ?General:  Intubated, obese middle-aged African-American patient in no acute distress ?Respiratory:  Respirations  synchronous with ventilator ? Afebrile. Head is nontraumatic. Neck is supple  ?Neurological Exam :  ?Sedation held this AM.  Intubated.  Eyes are closed.  She does not respond to tactile or verbal stimuli but with sternal rub she gets slightly agitated . ?Pupils 71mm bilaterally and nonreactive, corneal reflexes intact, oculocephalic reflex absent, cough and gag reflexes present.  Will not follow commands but grimaces and moves all extremities to sternal rub.  No spontaneous extremity movements noted.  Plantars both mute. ? ?ASSESSMENT/PLAN ?[name] is a 51 y.o. female with history of HTN, TIA, ETOH abuse and medication noncompliance presenting after she was found by her family to be unresponsive in her car on 4/8.  She was moved to her couch, and when her family checked on her again on 4/9, she had not moved  and EMS was called.  She was brought to the hospital and intubated for inability to protect her airways.  Workup for acute encephalopathy was started.  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  Multiple punctate acute ischemic infarcts were also seen in bilateral cerebral hemispheres.  CTA head and neck performed this morning shows severe bilateral P1 and right P2 stenoses as well as proximal left M2 stenosis and severe right ACA stenosis. ? ?Stroke in artery of Percheron vs. top of the basilar syndrome from bilateral embolic strokes.  Less likely Wernicke's encephalopathy. MRI with multiple punctate acute ischemic infarcts in bilateral hemispheres:  secondary to multifocal intracranial stenosis and possible embolic source in patient who was found down after a significant amount of time ?CT head No acute abnormality.  ?CTA head & neck severe bilateral P1 and right P2 stenoses as well as proximal left M2 stenosis and severe right ACA stenosis, 40% stenosis of left ICA ?CTV no dural venous sinus thrombosis ?MRI   Patchy restricted diffusion in bilateral thalami and midbrain with multiple punctate acute ischemic infarcts in bilateral cerebral hemispheres and multiple chronic microhemorrhages ?2D Echo ejection fraction 55 to 60%.  Left atrium mildly dilated. ?LDL 132 ?HgbA1c 5.7 ?VTE prophylaxis - SCDs ?   ?Diet  ? Diet NPO time specified  ? ?No antithrombotic prior to admission, now on clopidogrel 75 mg daily. (Aspirin allergy) ?Therapy recommendations:  pending ?Disposition:  pending ? ?Concern for Wernicke's encephalopathy ?Patient has a history of heavy alcohol use ?MRI shows DWI changes in bilateral thalami and midbrain  ( versus changes from severe wernicke`s encephalopathy less likely ) ?Thiamine 500 mg IV q8h ? ?Hypertension ?Home meds:  none known ?Stable ?Permissive hypertension (OK if < 220/120) but gradually normalize in 5-7 days ?Long-term BP goal normotensive ? ?Hyperlipidemia ?Home meds:  none ?LDL 132, goal < 70 ?Rosuvastatin 40 mg daily ? ?Risk for Diabetes type II  ?Home meds:  none ?HgbA1c 5.7, goal < 7.0 ?CBGs ?Recent Labs  ?  01/25/22 ?[id number] 01/25/22 ?0800 01/25/22 ?1139  ?GLUCAP 118* 118* 160*  ? ?  ?SSI ? ?Respiratory failure ?Patient was intubated for inability to protect airway ?Ventilator management per CCM ? ?Other Stroke Risk Factors ?ETOH use, alcohol level <10, will advise to drink no more than 1 drink a day ?Obesity, Body mass index is 30.24 kg/m?., BMI >/= 30 associated with increased stroke risk, recommend weight loss, diet and exercise as appropriate  ?Hx TIA ? ?Other Active Problems ?Suspected seizures ?Question of seizures prior arrival ?LTM EEG revealed no seizures ? ?Risk for ETOH withdrawal ?Patient has history of heavy alcohol use ?ETOH level <10 on admission ?Phenobarbital taper per CCM ? ?Hospital day # 5 ? ?[place] , MSN, AGACNP-BC ?[facility] ?See Amion for schedule and pager information ?01/25/2022 1:26 PM ?  ?I have personally obtained history,examined this  patient, reviewed notes, independently viewed imaging studies, participated in medical decision making and plan of care.ROS completed by me personally and pertinent positives fully documented  I have made any additions or clarifications directly to the above note. Agree with note above.  Patient neurological exam remains quite poor despite being off sedation now.  Remains on full ventilatory support and is unlikely to be able to wean and will need early tracheostomy next week.  Family wants to pursue full supportive care at the present time.  Amantadine has shown no benefit for wakefulness  hence will discontinue and try Provigil instead.  Discussed with critical care team.This patient is critically ill and at significant risk of neurological worsening, death and care requires constant monitoring of vital signs, hemodynamics,respiratory and cardiac monitoring, extensive review of multiple databases, frequent neurological assessment, discussion with family, other specialists and medical decision making of high complexity.I have made any additions or clarifications directly to the above note.This critical care time does not reflect procedure time, or teaching time or supervisory time of PA/NP/Med Resident etc but could involve care discussion time. ? I spent 30 minutes of neurocritical care time  in the care of  this patient. ? ?  ? ?Antony Contras, MD ?Medical Director ?[facility] ?Pager: [phone] ?01/25/2022 2:39 PM ? ? ?To contact Stroke Continuity provider, please refer to [email or web address] ?After hours, contact General Neurology  ?

## 2022-01-25 NOTE — Progress Notes (Addendum)
? ?NAME:  Cindy Brooks, MRN:  [id number], DOB:  Apr 04, 1971, LOS: 5 ?ADMISSION DATE:  01/20/2022, CONSULTATION DATE: 01/20/2022 ?REFERRING MD: [facility], CHIEF COMPLAINT: Altered mental status ? ?History of Present Illness:  ?51 year old female who was found outside in her car by family on 4/8 and was moved to the house and left for 5 hours at which time she did not move. Question if she had seizures therefore emergency medical services was activated.  She is transferred to [facility] emergency department near obtunded has extremely loud respirations copious secretions and difficulty controlling her airway.  She has been seen by neurology.  She admitted to the intensive care unit for further evaluation and treatment. ? ?Pertinent  Medical History  ?HTN  ?TIA ? ?Significant Hospital Events: ?Including procedures, antibiotic start and stop dates in addition to other pertinent events   ?4/9 Admit  ?4/12 ongoing high dose thiamine for possible Wernike's encephalopathy  ?4/13 IV sedation stopped, phenobarbital wean ? ?Interim History / Subjective:  ?Off precedex ?Tmax 99.1 ?I/O 3L UOP, -1.3L in last 24 hours (s/p lasix x1) ?No acute events overnight. Received fentanyl PRN x3 overnight  ? ?Objective   ?Blood pressure (!) 141/91, pulse 81, temperature 99.1 ?F (37.3 ?C), temperature source Axillary, resp. rate 18, height 5\' 4"  (1.626 m), weight 79.9 kg, SpO2 99 %. ?   ?Vent Mode: PRVC ?FiO2 (%):  [40 %] 40 % ?Set Rate:  [18 bmp] 18 bmp ?Vt Set:  [440 mL] 440 mL ?PEEP:  [5 cmH20] 5 cmH20 ?Plateau Pressure:  [17 cmH20-19 cmH20] 18 cmH20  ? ?Intake/Output Summary (Last 24 hours) at 01/25/2022 1018 ?Last data filed at 01/25/2022 0830 ?Gross per 24 hour  ?Intake 1501.07 ml  ?Output 3150 ml  ?Net -1648.93 ml  ? ?Filed Weights  ? 01/21/22 0500 01/22/22 0500 01/23/22 0458  ?Weight: 80.9 kg 82.1 kg 79.9 kg  ? ? ?Examination: ?General: adult female lying in bed in NAD on vent  ?HEENT: MM pink/moist, ETT, pupils 21mm fixed, anicteric  ?Neuro: no  response to verbal stimuli, + corneals, cough/gag present, does not follow commands, withdraws to pain on BLE's ?CV: s1s2 RRR, no m/r/g ?PULM: non-labored at rest, lungs bilaterally clear ?GI: soft, bsx4 active  ?Extremities: warm/dry, trace edema  ?Skin: no rashes or lesions ? ?Resolved Hospital Problem list   ? ? ?Assessment & Plan:  ? ?Acute Metabolic Encephalopathy / Possible Wernicke's  ?Stroke  ?Alcohol Abuse and reported seizure activity ?EEG negative for seizure. Suspect this is stroke rather than Wernike's.  CT head negative.  Stenosis on CTA head /neck.   ?-appreciate Neurology assistance with patient care  ?-continue thiamine, folic acid, MVI   ?-Neuro plan is to reassess MRI at the 2 week mark, will need trach given vent needs ?-continue phenobarbital wean to off  ?-PRN fentanyl  ?-anti-epileptics per Neuro ?-seizure precautions, though not clear seizure on admit  ? ?Acute Hypoxemic Respiratory Failure ?Intubated for encephalopathy - mental status barrier to extubation ?-PRVC 8cc/kg  ?-daily SBT but mental status remains barrier for extubation  ?-will need early trach  ?-repeat lasix 40mg  x1 ?-CXR in am for ETT placement, last 4/9 ? ?UTI ?Blood cultures negative, UC with insignificant growth  ?-completed ceftriaxone 4/13  ? ?Hypertension ?-SBP goal <140 ? ?Anemia ?Hgb baseline 12-13.  ?-trend CBC  ?-transfuse for Hgb <7% ? ?Alcohol Use Disorder ?-monitor for withdrawal, complete phenobarbital wean  ? ?Best Practice (right click and "Reselect all SmartList Selections" daily)  ?Diet/type: NPO with tube feeds ?  DVT prophylaxis: LMWH ?GI prophylaxis: PPI ?Lines: N/A ?Foley:  N/A ?Code Status:  full code ?Last date of multidisciplinary goals of care discussion: daughters (2 in person, one via phone) updated at bedside 4/13 on plan of care.  Reviewed potential need for trach.  Will plan for early next week.  Discussed long term of implications of stroke, trach, and high level care needs.  Family would like to  pursue trach.  ? ?Critical care time: 34 minutes  ? ?Canary Brim, MSN, APRN, NP-C, AGACNP-BC ?[facility] ?01/25/2022, 10:18 AM ? ? ?Please see [email or web address] for pager details.  ? ?From 7A-7P if no response, please call [phone] ?After hours, please call Pola Corn [phone] ? ? ? ? ?

## 2022-01-25 NOTE — Plan of Care (Signed)
Interdisciplinary Goals of Care Family Meeting ? ? ?Date carried out:: 01/25/2022 ? ?Location of the meeting: Bedside ? ?Member's involved: Physician, Bedside Registered Nurse, and Family Member or next of kin ? ?Durable Power of Attorney or acting medical decision maker: Osmara Drummonds   ? ?Discussion: We discussed goals of care for [facility] .   ? ?The Clinical status was relayed to daughters at bedside in detail. ?  ?Updated and notified of patients medical condition. ?  ?  ?Patient remains unresponsive and will not open eyes to command.   ?Patient is having a weak cough and struggling to remove secretions.   ?Patient with increased WOB and using accessory muscles to breathe ?Explained to family course of therapy and the modalities  ?Signs of brain damage ?  ?Patient's family decided to proceed with tracheostomy and continue full scope of care, considering she may not be able to wake up at all. ? ?Code status: Full Code ? ?Disposition: Continue current acute care ? ?  ?Family are satisfied with Plan of action and management. All questions answered ?  ?Cheri Fowler MD ?[facility] ?See Amion for pager ?If no response to pager, please call [phone] until 7pm ?After 7pm, Please call E-link [phone] ? ?

## 2022-01-26 ENCOUNTER — Inpatient Hospital Stay (HOSPITAL_COMMUNITY): Payer: No Typology Code available for payment source

## 2022-01-26 DIAGNOSIS — R1312 Dysphagia, oropharyngeal phase: Secondary | ICD-10-CM

## 2022-01-26 DIAGNOSIS — I674 Hypertensive encephalopathy: Secondary | ICD-10-CM

## 2022-01-26 DIAGNOSIS — J9620 Acute and chronic respiratory failure, unspecified whether with hypoxia or hypercapnia: Secondary | ICD-10-CM

## 2022-01-26 LAB — GLUCOSE, CAPILLARY
Glucose-Capillary: 133 mg/dL — ABNORMAL HIGH (ref 70–99)
Glucose-Capillary: 137 mg/dL — ABNORMAL HIGH (ref 70–99)
Glucose-Capillary: 148 mg/dL — ABNORMAL HIGH (ref 70–99)
Glucose-Capillary: 149 mg/dL — ABNORMAL HIGH (ref 70–99)
Glucose-Capillary: 150 mg/dL — ABNORMAL HIGH (ref 70–99)
Glucose-Capillary: 157 mg/dL — ABNORMAL HIGH (ref 70–99)
Glucose-Capillary: 175 mg/dL — ABNORMAL HIGH (ref 70–99)

## 2022-01-26 LAB — CBC
HCT: 38.4 % (ref 36.0–46.0)
Hemoglobin: 12.3 g/dL (ref 12.0–15.0)
MCH: 26.6 pg (ref 26.0–34.0)
MCHC: 32 g/dL (ref 30.0–36.0)
MCV: 82.9 fL (ref 80.0–100.0)
Platelets: 309 10*3/uL (ref 150–400)
RBC: 4.63 MIL/uL (ref 3.87–5.11)
RDW: 15.3 % (ref 11.5–15.5)
WBC: 10.2 10*3/uL (ref 4.0–10.5)
nRBC: 0 % (ref 0.0–0.2)

## 2022-01-26 LAB — BASIC METABOLIC PANEL
Anion gap: 7 (ref 5–15)
BUN: 19 mg/dL (ref 6–20)
CO2: 31 mmol/L (ref 22–32)
Calcium: 9.5 mg/dL (ref 8.9–10.3)
Chloride: 109 mmol/L (ref 98–111)
Creatinine, Ser: 0.97 mg/dL (ref 0.44–1.00)
GFR, Estimated: >60 mL/min (ref >60)
Glucose, Bld: 129 mg/dL — ABNORMAL HIGH (ref 70–99)
Potassium: 4.9 mmol/L (ref 3.5–5.1)
Sodium: 147 mmol/L — ABNORMAL HIGH (ref 135–145)

## 2022-01-26 IMAGING — DX DG CHEST 1V PORT
1 series · 1 of 1 positions shown · non-contrast
Comparison: [DATE]

CLINICAL DATA: Acute encephalopathy.  Status post intubation.

EXAM:
PORTABLE CHEST 1 VIEW

[chest ap]
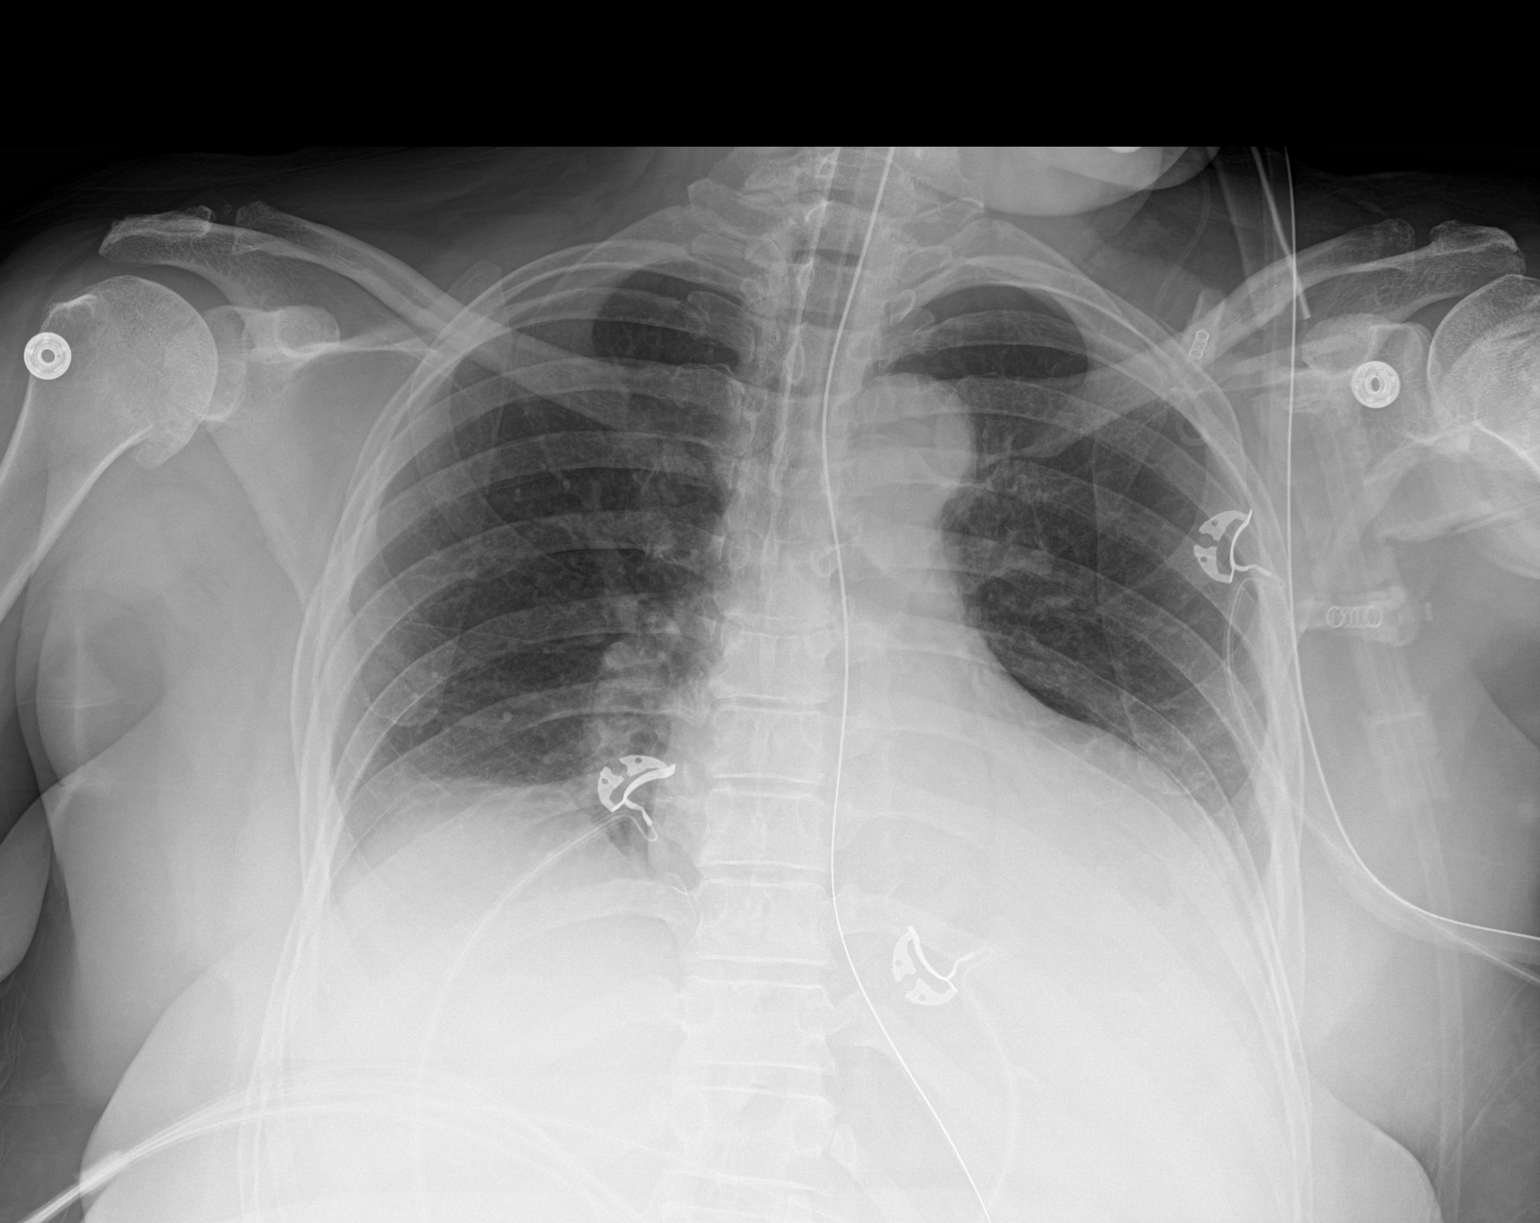

[1 of 1 positions shown; findings below may reference images not displayed]

FINDINGS: The ET tube tip is approximately 2.3 cm above the carina. There is
an enteric tube with tip and side port below the GE junction. Stable
cardiomediastinal contours. Decreased lung volumes. New left lower
lobe retrocardiac opacity compatible with atelectasis or airspace
disease. Right lung appears clear.
IMPRESSION: 1. New left lower lobe atelectasis versus airspace disease.

## 2022-01-26 MED ORDER — INSULIN ASPART 100 UNIT/ML IJ SOLN
0.0000 [IU] | INTRAMUSCULAR | Status: DC
  Administered 2022-01-26: 2 [IU] via SUBCUTANEOUS
  Administered 2022-01-26: 3 [IU] via SUBCUTANEOUS
  Administered 2022-01-26 – 2022-01-29 (×7): 2 [IU] via SUBCUTANEOUS
  Administered 2022-01-29 (×2): 3 [IU] via SUBCUTANEOUS
  Administered 2022-01-29 (×2): 2 [IU] via SUBCUTANEOUS
  Administered 2022-01-29 – 2022-01-30 (×3): 3 [IU] via SUBCUTANEOUS
  Administered 2022-01-30 – 2022-01-31 (×3): 2 [IU] via SUBCUTANEOUS
  Administered 2022-01-31: 5 [IU] via SUBCUTANEOUS
  Administered 2022-01-31: 2 [IU] via SUBCUTANEOUS
  Administered 2022-01-31 (×3): 3 [IU] via SUBCUTANEOUS
  Administered 2022-02-01: 2 [IU] via SUBCUTANEOUS
  Administered 2022-02-01 (×2): 3 [IU] via SUBCUTANEOUS
  Administered 2022-02-01 (×2): 2 [IU] via SUBCUTANEOUS
  Administered 2022-02-01 – 2022-02-02 (×3): 3 [IU] via SUBCUTANEOUS
  Administered 2022-02-02 – 2022-02-03 (×3): 2 [IU] via SUBCUTANEOUS
  Administered 2022-02-03: 3 [IU] via SUBCUTANEOUS
  Administered 2022-02-03: 2 [IU] via SUBCUTANEOUS
  Administered 2022-02-03 (×2): 3 [IU] via SUBCUTANEOUS
  Administered 2022-02-04: 2 [IU] via SUBCUTANEOUS
  Administered 2022-02-04: 5 [IU] via SUBCUTANEOUS
  Administered 2022-02-04: 3 [IU] via SUBCUTANEOUS
  Administered 2022-02-04: 2 [IU] via SUBCUTANEOUS
  Administered 2022-02-04 – 2022-02-05 (×3): 3 [IU] via SUBCUTANEOUS
  Administered 2022-02-05: 5 [IU] via SUBCUTANEOUS
  Administered 2022-02-05: 3 [IU] via SUBCUTANEOUS
  Administered 2022-02-05: 5 [IU] via SUBCUTANEOUS
  Administered 2022-02-05 – 2022-02-06 (×3): 3 [IU] via SUBCUTANEOUS
  Administered 2022-02-06: 8 [IU] via SUBCUTANEOUS
  Administered 2022-02-06 (×2): 3 [IU] via SUBCUTANEOUS
  Administered 2022-02-06: 2 [IU] via SUBCUTANEOUS
  Administered 2022-02-06 – 2022-02-07 (×7): 3 [IU] via SUBCUTANEOUS
  Administered 2022-02-07: 5 [IU] via SUBCUTANEOUS
  Administered 2022-02-08: 3 [IU] via SUBCUTANEOUS
  Administered 2022-02-08 (×2): 2 [IU] via SUBCUTANEOUS
  Administered 2022-02-08: 3 [IU] via SUBCUTANEOUS
  Administered 2022-02-09 (×2): 2 [IU] via SUBCUTANEOUS
  Administered 2022-02-10: 3 [IU] via SUBCUTANEOUS
  Administered 2022-02-10 – 2022-02-11 (×4): 2 [IU] via SUBCUTANEOUS
  Administered 2022-02-11: 3 [IU] via SUBCUTANEOUS
  Administered 2022-02-11 – 2022-02-12 (×5): 2 [IU] via SUBCUTANEOUS
  Administered 2022-02-12: 3 [IU] via SUBCUTANEOUS
  Administered 2022-02-12: 2 [IU] via SUBCUTANEOUS
  Administered 2022-02-13 (×2): 3 [IU] via SUBCUTANEOUS
  Administered 2022-02-13: 2 [IU] via SUBCUTANEOUS
  Administered 2022-02-13 (×2): 3 [IU] via SUBCUTANEOUS
  Administered 2022-02-14 (×4): 2 [IU] via SUBCUTANEOUS
  Administered 2022-02-14: 3 [IU] via SUBCUTANEOUS
  Administered 2022-02-14 (×2): 2 [IU] via SUBCUTANEOUS
  Administered 2022-02-15: 3 [IU] via SUBCUTANEOUS
  Administered 2022-02-15: 2 [IU] via SUBCUTANEOUS
  Administered 2022-02-15: 3 [IU] via SUBCUTANEOUS
  Administered 2022-02-15 (×2): 2 [IU] via SUBCUTANEOUS
  Administered 2022-02-15 – 2022-02-16 (×2): 3 [IU] via SUBCUTANEOUS
  Administered 2022-02-16: 2 [IU] via SUBCUTANEOUS
  Administered 2022-02-16: 3 [IU] via SUBCUTANEOUS
  Administered 2022-02-16 – 2022-02-17 (×5): 2 [IU] via SUBCUTANEOUS
  Administered 2022-02-17 – 2022-02-18 (×2): 3 [IU] via SUBCUTANEOUS
  Administered 2022-02-18 (×2): 2 [IU] via SUBCUTANEOUS
  Administered 2022-02-18: 3 [IU] via SUBCUTANEOUS
  Administered 2022-02-18: 2 [IU] via SUBCUTANEOUS
  Administered 2022-02-19 – 2022-02-20 (×9): 3 [IU] via SUBCUTANEOUS
  Administered 2022-02-20 – 2022-02-21 (×3): 2 [IU] via SUBCUTANEOUS
  Administered 2022-02-21: 3 [IU] via SUBCUTANEOUS
  Administered 2022-02-21: 2 [IU] via SUBCUTANEOUS
  Administered 2022-02-21: 3 [IU] via SUBCUTANEOUS
  Administered 2022-02-22 – 2022-02-23 (×5): 2 [IU] via SUBCUTANEOUS
  Administered 2022-02-23: 3 [IU] via SUBCUTANEOUS
  Administered 2022-02-24: 2 [IU] via SUBCUTANEOUS
  Administered 2022-02-24: 3 [IU] via SUBCUTANEOUS
  Administered 2022-02-24 – 2022-03-01 (×18): 2 [IU] via SUBCUTANEOUS
  Administered 2022-03-01: 3 [IU] via SUBCUTANEOUS
  Administered 2022-03-01 – 2022-03-03 (×10): 2 [IU] via SUBCUTANEOUS
  Administered 2022-03-03: 3 [IU] via SUBCUTANEOUS
  Administered 2022-03-03 (×3): 2 [IU] via SUBCUTANEOUS
  Administered 2022-03-04 (×2): 3 [IU] via SUBCUTANEOUS
  Administered 2022-03-04 (×2): 2 [IU] via SUBCUTANEOUS
  Administered 2022-03-05 (×2): 3 [IU] via SUBCUTANEOUS
  Administered 2022-03-05 (×2): 2 [IU] via SUBCUTANEOUS
  Administered 2022-03-05: 3 [IU] via SUBCUTANEOUS
  Administered 2022-03-05 – 2022-03-06 (×2): 2 [IU] via SUBCUTANEOUS
  Administered 2022-03-06 – 2022-03-07 (×2): 3 [IU] via SUBCUTANEOUS
  Administered 2022-03-07: 2 [IU] via SUBCUTANEOUS
  Administered 2022-03-07 (×2): 3 [IU] via SUBCUTANEOUS
  Administered 2022-03-07 – 2022-03-09 (×3): 2 [IU] via SUBCUTANEOUS
  Administered 2022-03-09 (×2): 3 [IU] via SUBCUTANEOUS
  Administered 2022-03-09: 2 [IU] via SUBCUTANEOUS
  Administered 2022-03-10: 3 [IU] via SUBCUTANEOUS
  Administered 2022-03-10 (×2): 2 [IU] via SUBCUTANEOUS
  Administered 2022-03-10: 3 [IU] via SUBCUTANEOUS
  Administered 2022-03-11: 2 [IU] via SUBCUTANEOUS
  Administered 2022-03-11 (×2): 3 [IU] via SUBCUTANEOUS
  Administered 2022-03-12 (×4): 2 [IU] via SUBCUTANEOUS
  Administered 2022-03-13 (×2): 3 [IU] via SUBCUTANEOUS
  Administered 2022-03-13 – 2022-03-14 (×2): 2 [IU] via SUBCUTANEOUS
  Administered 2022-03-14 (×2): 3 [IU] via SUBCUTANEOUS
  Administered 2022-03-14: 2 [IU] via SUBCUTANEOUS
  Administered 2022-03-15 (×3): 3 [IU] via SUBCUTANEOUS
  Administered 2022-03-16: 2 [IU] via SUBCUTANEOUS
  Administered 2022-03-18: 3 [IU] via SUBCUTANEOUS
  Administered 2022-03-18 (×2): 2 [IU] via SUBCUTANEOUS
  Administered 2022-03-19: 3 [IU] via SUBCUTANEOUS
  Administered 2022-03-19: 2 [IU] via SUBCUTANEOUS

## 2022-01-26 MED ORDER — FREE WATER
100.0000 mL | Freq: Four times a day (QID) | Status: DC
  Administered 2022-01-26 (×4): 100 mL

## 2022-01-26 MED ORDER — LEVETIRACETAM 500 MG PO TABS
1000.0000 mg | ORAL_TABLET | Freq: Two times a day (BID) | ORAL | Status: DC
  Administered 2022-01-27: 1000 mg
  Filled 2022-01-26: qty 2

## 2022-01-26 NOTE — Progress Notes (Signed)
? ?NAME:  Daryana Heidebrink, MRN:  [id number], DOB:  [birth year], LOS: 6 ?ADMISSION DATE:  01/20/2022, CONSULTATION DATE: 01/20/2022 ?REFERRING MD: [facility], CHIEF COMPLAINT: Altered mental status ? ?History of Present Illness:  ?51 year old female who was found outside in her car by family on 4/8 and was moved to the house and left for 5 hours at which time she did not move. Question if she had seizures therefore emergency medical services was activated.  She is transferred to [facility] emergency department near obtunded has extremely loud respirations copious secretions and difficulty controlling her airway.  She has been seen by neurology.  She admitted to the intensive care unit for further evaluation and treatment. ? ?Pertinent  Medical History  ?HTN  ?TIA ? ?Significant Hospital Events: ?Including procedures, antibiotic start and stop dates in addition to other pertinent events   ?4/9 Admit  ?4/12 ongoing high dose thiamine for possible Wernike's encephalopathy  ?4/13 IV sedation stopped, phenobarbital wean ? ?Interim History / Subjective:  ?Remain afebrile ?Off sedation, still not waking up ?Good urine output  ? ?Objective   ?Blood pressure (!) 148/96, pulse (!) 106, temperature 99.7 ?F (37.6 ?C), temperature source Axillary, resp. rate 18, height 5\' 4"  (1.626 m), weight 81.7 kg, SpO2 100 %. ?   ?Vent Mode: PRVC ?FiO2 (%):  [40 %] 40 % ?Set Rate:  [18 bmp] 18 bmp ?Vt Set:  [440 mL] 440 mL ?PEEP:  [5 cmH20] 5 cmH20 ?Plateau Pressure:  [18 cmH20-20 cmH20] 18 cmH20  ? ?Intake/Output Summary (Last 24 hours) at 01/26/2022 0803 ?Last data filed at 01/26/2022 [id number] ?Gross per 24 hour  ?Intake 1511.23 ml  ?Output 2800 ml  ?Net -1288.77 ml  ? ?Filed Weights  ? 01/22/22 0500 01/23/22 0458 01/26/22 0435  ?Weight: 82.1 kg 79.9 kg 81.7 kg  ? ? ?Examination: ?  ?Physical exam: ?General: Crtitically ill-appearing female, orally intubated ?HEENT: [place]/AT, eyes anicteric.  ETT and OGT in place ?Neuro: Eyes closed, does not open, pupils mid  dilated fixed, positive corneal, cough and gag.  Withdrawing in all 4 extremities with painful stimuli ?Chest: Coarse breath sounds, no wheezes or rhonchi ?Heart: Regular rhythm, tachycardic, no murmurs or gallops ?Abdomen: Soft, nontender, nondistended, bowel sounds present ?Skin: No rash ? ?Resolved Hospital Problem list   ? ? ?Assessment & Plan:  ?Acute midbrain/bilateral thalami stroke ?Continue to watch every hour ?Stroke team is following ?Continue secondary stroke prophylaxis ?On aspirin and statin ? ?Acute Metabolic Encephalopathy / Possible Wernicke's  ?Alcohol Abuse and reported seizure activity ?EEG negative for seizure ?Encephalopathy could be related to bilateral thalamic stroke ?Continue thiamine, folate and multivitamin ?Repeat MRI in 2 weeks ?Continue phenobarbital protocol for alcohol withdrawal ?Avoid sedation with RASS goal 0/-1 ? ?Acute Hypoxemic Respiratory Failure ?Placed on spontaneous breathing trial, failing due to low tidal volumes ?Continue lung protective ventilation ?Patient is scheduled for tracheostomy on 4/16 ?VAP bundle ? ?Acute UTI ?UA suggestive of UTI ?Urine culture grew less than 100,000 colonies ?Continue ceftriaxone today will be the last day ? ?Hypertension ?SBP goal less than 140 ?Continue amlodipine ? ?Best Practice (right click and "Reselect all SmartList Selections" daily)  ?Diet/type: NPO with tube feeds ?DVT prophylaxis: LMWH ?GI prophylaxis: PPI ?Lines: N/A ?Foley:  N/A ?Code Status:  full code ?Last date of multidisciplinary goals of care discussion: daughters (2 in person) updated at bedside 4/14, please see plan of care note ? ? ?Critical care time:   ? ?Total critical care time: 34 minutes ? ?Performed by: Jacky Kindle ?  ?  Critical care time was exclusive of separately billable procedures and treating other patients. ?  ?Critical care was necessary to treat or prevent imminent or life-threatening deterioration. ?  ?Critical care was time spent personally by me on  the following activities: development of treatment plan with patient and/or surrogate as well as nursing, discussions with consultants, evaluation of patient's response to treatment, examination of patient, obtaining history from patient or surrogate, ordering and performing treatments and interventions, ordering and review of laboratory studies, ordering and review of radiographic studies, pulse oximetry and re-evaluation of patient's condition. ?  ?Jacky Kindle MD ?[facility] ?See Amion for pager ?If no response to pager, please call [phone] until 7pm ?After 7pm, Please call E-link [phone] ? ? ? ? ? ?

## 2022-01-26 NOTE — Progress Notes (Signed)
eLink Physician-Brief Progress Note ?Patient Name: Cindy Brooks ?DOB: [birth year] ?MRN: [id number] ? ? ?Date of Service ? 01/26/2022  ?HPI/Events of Note ? CBG > 175, asking for SSI. Labs reviewed. Not on steroids, no hx of DM.   ?eICU Interventions ? SSI q6 hrly ordered.   ? ? ? ?  ? ?Ranee Gosselin ?01/26/2022, 3:34 AM ?

## 2022-01-26 NOTE — Progress Notes (Addendum)
STROKE TEAM PROGRESS NOTE  ? ?INTERVAL HISTORY ?Patient is seen in her room with no family members at the bedside.  She has been hemodynamically stable and has had no acute events overnight.  Her neurological exam is unchanged.  Trial of Provigil continues.  Plan is to place tracheostomy tomorrow and PEG tube on Monday. ?Vitals:  ? 01/26/22 1143 01/26/22 1200 01/26/22 1300 01/26/22 1400  ?BP: (!) 146/99 (!) 148/104 (!) 158/104 (!) 160/104  ?Pulse: (!) 111 (!) 112 (!) 108 (!) 110  ?Resp: (!) 23 (!) 23 (!) 23 (!) 21  ?Temp:  100 ?F (37.8 ?C)    ?TempSrc:  Axillary    ?SpO2:  100% 100% 100%  ?Weight:      ?Height:      ? ?CBC:  ?Recent Labs  ?Lab 01/22/22 ?[id number] 01/23/22 ?[id number] 01/24/22 ?[id number] 01/25/22 ?0409 01/26/22 ?0236  ?WBC 9.7 7.1   < > 10.2 10.2  ?NEUTROABS 6.8 4.7  --   --   --   ?HGB 12.1 11.4*   < > 11.6* 12.3  ?HCT 36.7 35.2*   < > 35.3* 38.4  ?MCV 82.7 83.0   < > 82.3 82.9  ?PLT 238 202   < > 280 309  ? < > = values in this interval not displayed.  ? ? ?Basic Metabolic Panel:  ?Recent Labs  ?Lab 01/21/22 ?1700 01/22/22 ?[id number] 01/23/22 ?[id number] 01/25/22 ?0409 01/26/22 ?0236  ?NA  --  143   < > 142 147*  ?K  --  4.3   < > 4.3 4.9  ?CL  --  113*   < > 107 109  ?CO2  --  23   < > 27 31  ?GLUCOSE  --  112*   < > 139* 129*  ?BUN  --  11   < > 15 19  ?CREATININE  --  0.92   < > 0.84 0.97  ?CALCIUM  --  9.1   < > 8.7* 9.5  ?MG 1.8 2.1  --   --   --   ?PHOS 3.3 4.3  --   --   --   ? < > = values in this interval not displayed.  ? ? ?Lipid Panel:  ?Recent Labs  ?Lab 01/22/22 ?0315  ?CHOL 225*  ?TRIG 181*  ?HDL 57  ?CHOLHDL 3.9  ?VLDL 36  ?[place] 132*  ? ? ?HgbA1c:  ?Recent Labs  ?Lab 01/22/22 ?0315  ?HGBA1C 5.7*  ? ? ?Urine Drug Screen:  ?Recent Labs  ?Lab 01/20/22 ?0351  ?LABOPIA NONE DETECTED  ?COCAINSCRNUR NONE DETECTED  ?LABBENZ NONE DETECTED  ?AMPHETMU NONE DETECTED  ?THCU NONE DETECTED  ?LABBARB NONE DETECTED  ? ?  ?Alcohol Level  ?Recent Labs  ?Lab 01/20/22 ?[id number]  ?ETH <10  ? ? ? ?IMAGING past 24 hours ?DG CHEST  PORT 1 VIEW ? ?Result Date: 01/26/2022 ?CLINICAL DATA:  Acute encephalopathy.  Status post intubation. EXAM: PORTABLE CHEST 1 VIEW COMPARISON:  01/20/2022 FINDINGS: The ET tube tip is approximately 2.3 cm above the carina. There is an enteric tube with tip and side port below the GE junction. Stable cardiomediastinal contours. Decreased lung volumes. New left lower lobe retrocardiac opacity compatible with atelectasis or airspace disease. Right lung appears clear. IMPRESSION: 1. New left lower lobe atelectasis versus airspace disease. Electronically Signed   By: Kerby Moors M.D.   On: 01/26/2022 10:19   ? ?PHYSICAL EXAM ?General:  Intubated, obese middle-aged African-American patient in no acute distress ?Respiratory:  Respirations  synchronous with ventilator ? Afebrile. Head is nontraumatic. Neck is supple  ?Neurological Exam :  ?Sedation held this AM.  Intubated.  Eyes are closed.  She does not respond to tactile or verbal stimuli but with sternal rub she gets slightly agitated . ?Pupils 68mm bilaterally and nonreactive, corneal reflexes intact, oculocephalic reflex absent, cough and gag reflexes present.  Will not follow commands but grimaces and moves all extremities to sternal rub.  No spontaneous extremity movements noted.  Plantars both mute. ? ?ASSESSMENT/PLAN ?Ms. Aislyn Canner is a 51 y.o. female with history of HTN, TIA, ETOH abuse and medication noncompliance presenting after she was found by her family to be unresponsive in her car on 4/8.  She was moved to her couch, and when her family checked on her again on 4/9, she had not moved and EMS was called.  She was brought to the hospital and intubated for inability to protect her airways.  Workup for acute encephalopathy was started.  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  Multiple punctate acute ischemic infarcts were also seen in bilateral cerebral  hemispheres.  CTA head and neck performed this morning shows severe bilateral P1 and right P2 stenoses as well as proximal left M2 stenosis and severe right ACA stenosis. ? 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  Less likely Wernicke's encephalopathy. Source unclear ?CT head No acute abnormality.  ?CTA head & neck severe bilateral P1 and right P2 stenoses as well as proximal left M2 stenosis and severe right ACA stenosis, 40% stenosis of left ICA ?CTV no dural venous sinus thrombosis ?MRI  Patchy restricted diffusion in bilateral thalami and midbrain with multiple punctate acute ischemic infarcts in bilateral cerebral hemispheres and multiple chronic microhemorrhages ?2D Echo EF 55 to 60%.  Left atrium mildly dilated. ?LE venous doppler pending ?Consider TEE for further work up ?Hypercoagulable work up pending ?LDL 132 ?HgbA1c 5.7 ?VTE prophylaxis - lovenox ?No antithrombotic prior to admission, now on clopidogrel 75 mg daily. (Aspirin allergy) ?Therapy recommendations:  pending ?Disposition:  pending ? ??? Wernicke's encephalopathy ?Patient has a history of heavy alcohol use ?MRI shows DWI changes in bilateral thalami and midbrain  ( versus changes from severe wernicke`s encephalopathy less likely ) ?Thiamine supplement ?On phenobarbital for alcohol withdraw  ? ?Respiratory failure ?Patient was intubated for inability to protect airway ?Ventilator management per CCM ?Difficulty being off ventilation ?Plan for [place] tomorrow ? ?Hypertension ?Home meds:  none ?Stable ?Long-term BP goal normotensive ? ?Hyperlipidemia ?Home meds:  none ?LDL 132, goal < 70 ?Rosuvastatin 40 mg daily ?Continue statin on discharge ? ?Other Stroke Risk Factors ?Obesity, Body mass index is 30.92 kg/m?., BMI >/= 30 associated with increased stroke risk, recommend weight loss, diet and exercise as appropriate  ?Hx TIA ? ?Other Active Problems ?Suspected seizures ?Question of seizures prior arrival ?LTM EEG  revealed no seizure ?On keppra, switch from IV to per tube ? ?Risk for ETOH withdrawal ?Patient has history of heavy alcohol use ?ETOH level <10 on admission ?Phenobarbital taper per CCM ? ?Dysphagia ?On tube feeding ?Plan for PEG on Monday ? ?Hospital day # 6 ? ?[place] , MSN, AGACNP-BC ?[facility] ?See Amion for schedule and pager information ?01/26/2022 3:02 PM ?  ?ATTENDING NOTE: ?I reviewed above note and agree with the assessment and plan. Pt was seen and examined.  ? ?51 year old female with history of hypertension, alcohol abuse,  medication noncompliance, TIA admitted for unresponsiveness.  She was intubated in ER.  CT no acute abnormality.  MRI showed artery of Percheron infarct involving bilateral midbrain and thalamus, as well as bilateral punctate cerebral embolic shower.  CTA head and neck showed intracranial multifocal stenosis including bilateral P1, right P2, left M2, right A2, 40% left ICA bulb stenosis.  CTV negative.  Carotid Doppler negative.  EF 55 to 60%.  LDL 132, A1c 5.7.  Creatinine 0.84.  Blood culture negative.  LE venous Doppler pending.  Hypercoagulable work-up pending.  May consider TEE if work-up unrevealing. ? ?On exam, intubated not on sedation, eyes closed, not following commands. With forced eye opening, eyes in mid position, not blinking to visual threat, not tracking, pupil bilaterally dilated and fixed. Corneal reflex present, gag and cough present. Breathing  over the vent.  Facial symmetry not able to test due to ET tube.  Tongue protrusion not cooperative. On pain stimulation, mild movement in all limbs, right stronger than left. Bilaterally no babinski. Sensation, coordination and gait not tested. ? ?Etiology for patient stroke not quite clear, concerning for embolic source.  May consider TEE if work-up unrevealing.  Continue Plavix as patient has aspirin allergy.  Continue Crestor.  Patient not improving off ventilation, planning for trach and PEG.   Continue Keppra for possible seizure activity.  History of alcohol abuse, currently on phenobarbital for alcohol withdrawal. ? ?For detailed assessment and plan, please refer to above as I have made changes wherever

## 2022-01-27 ENCOUNTER — Encounter (HOSPITAL_COMMUNITY): Payer: Medicaid Other

## 2022-01-27 ENCOUNTER — Inpatient Hospital Stay (HOSPITAL_COMMUNITY): Payer: No Typology Code available for payment source

## 2022-01-27 DIAGNOSIS — I6340 Cerebral infarction due to embolism of unspecified cerebral artery: Secondary | ICD-10-CM

## 2022-01-27 DIAGNOSIS — J9601 Acute respiratory failure with hypoxia: Secondary | ICD-10-CM

## 2022-01-27 DIAGNOSIS — R400 Somnolence: Secondary | ICD-10-CM

## 2022-01-27 LAB — BASIC METABOLIC PANEL
Anion gap: 8 (ref 5–15)
BUN: 22 mg/dL — ABNORMAL HIGH (ref 6–20)
CO2: 31 mmol/L (ref 22–32)
Calcium: 9.6 mg/dL (ref 8.9–10.3)
Chloride: 107 mmol/L (ref 98–111)
Creatinine, Ser: 0.96 mg/dL (ref 0.44–1.00)
GFR, Estimated: >60 mL/min (ref >60)
Glucose, Bld: 111 mg/dL — ABNORMAL HIGH (ref 70–99)
Potassium: 4.5 mmol/L (ref 3.5–5.1)
Sodium: 146 mmol/L — ABNORMAL HIGH (ref 135–145)

## 2022-01-27 LAB — RPR: RPR Ser Ql: NONREACTIVE

## 2022-01-27 LAB — GLUCOSE, CAPILLARY
Glucose-Capillary: 104 mg/dL — ABNORMAL HIGH (ref 70–99)
Glucose-Capillary: 106 mg/dL — ABNORMAL HIGH (ref 70–99)
Glucose-Capillary: 109 mg/dL — ABNORMAL HIGH (ref 70–99)
Glucose-Capillary: 111 mg/dL — ABNORMAL HIGH (ref 70–99)
Glucose-Capillary: 115 mg/dL — ABNORMAL HIGH (ref 70–99)
Glucose-Capillary: 122 mg/dL — ABNORMAL HIGH (ref 70–99)
Glucose-Capillary: 131 mg/dL — ABNORMAL HIGH (ref 70–99)

## 2022-01-27 LAB — CBC
HCT: 37.4 % (ref 36.0–46.0)
Hemoglobin: 12.2 g/dL (ref 12.0–15.0)
MCH: 26.7 pg (ref 26.0–34.0)
MCHC: 32.6 g/dL (ref 30.0–36.0)
MCV: 81.8 fL (ref 80.0–100.0)
Platelets: 326 10*3/uL (ref 150–400)
RBC: 4.57 MIL/uL (ref 3.87–5.11)
RDW: 15.4 % (ref 11.5–15.5)
WBC: 11.6 10*3/uL — ABNORMAL HIGH (ref 4.0–10.5)
nRBC: 0 % (ref 0.0–0.2)

## 2022-01-27 LAB — TSH: TSH: 1.227 u[IU]/mL (ref 0.350–4.500)

## 2022-01-27 IMAGING — DX DG CHEST 1V PORT
1 series · 1 of 1 positions shown · non-contrast
Comparison: Chest x-ray from yesterday.

CLINICAL DATA: Status post tracheostomy.

EXAM:
PORTABLE CHEST 1 VIEW

[chest ap]
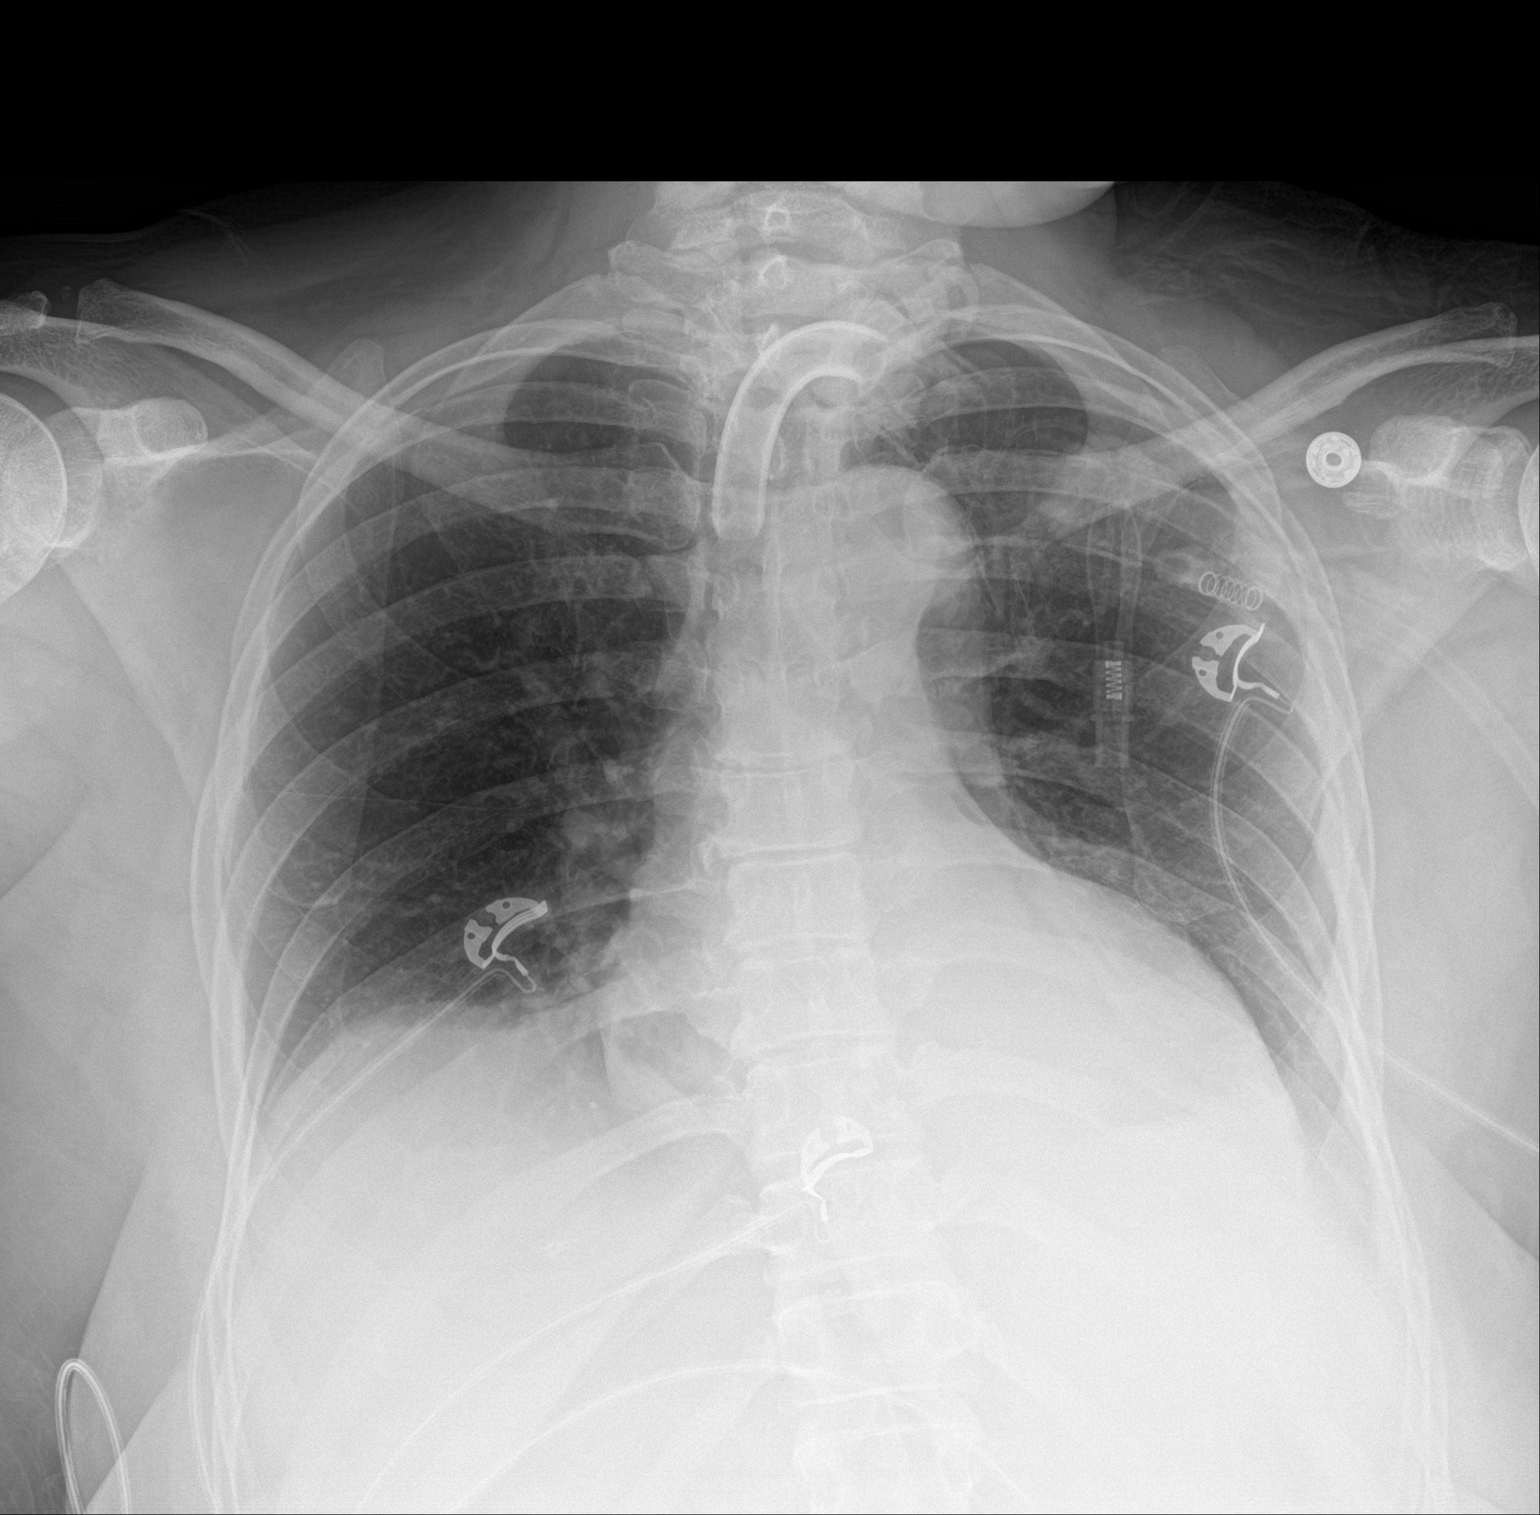

[1 of 1 positions shown; findings below may reference images not displayed]

FINDINGS: New tracheostomy tube with tip in good position 3.1 cm above the
carina. Interval removal of the enteric tube. Stable
cardiomediastinal silhouette. Unchanged retrocardiac left lower lobe
opacity. No pneumothorax or large pleural effusion. No acute osseous
abnormality.
IMPRESSION: 1. New tracheostomy tube in good position.
2. Unchanged retrocardiac left lower lobe opacity, favor
atelectasis.

## 2022-01-27 MED ORDER — AMLODIPINE BESYLATE 10 MG PO TABS
10.0000 mg | ORAL_TABLET | Freq: Every day | ORAL | Status: DC
  Administered 2022-01-28 – 2022-03-17 (×49): 10 mg
  Filled 2022-01-27 (×49): qty 1

## 2022-01-27 MED ORDER — LEVETIRACETAM IN NACL 1000 MG/100ML IV SOLN
1000.0000 mg | Freq: Two times a day (BID) | INTRAVENOUS | Status: DC
  Administered 2022-01-27 – 2022-02-01 (×10): 1000 mg via INTRAVENOUS
  Filled 2022-01-27 (×10): qty 100

## 2022-01-27 MED ORDER — VECURONIUM BROMIDE 10 MG IV SOLR: 10.0000 mg | Freq: Once | INTRAVENOUS | Status: AC

## 2022-01-27 MED ORDER — MIDAZOLAM HCL 2 MG/2ML IJ SOLN
INTRAMUSCULAR | Status: AC
  Administered 2022-01-27: 4 mg via INTRAVENOUS
  Filled 2022-01-27: qty 4

## 2022-01-27 MED ORDER — ETOMIDATE 2 MG/ML IV SOLN: 20.0000 mg | Freq: Once | INTRAVENOUS | Status: AC

## 2022-01-27 MED ORDER — ETOMIDATE 2 MG/ML IV SOLN
INTRAVENOUS | Status: AC
  Administered 2022-01-27: 20 mg via INTRAVENOUS
  Filled 2022-01-27: qty 10

## 2022-01-27 MED ORDER — MIDAZOLAM HCL 2 MG/2ML IJ SOLN: 4.0000 mg | Freq: Once | INTRAMUSCULAR | Status: AC

## 2022-01-27 MED ORDER — VECURONIUM BROMIDE 10 MG IV SOLR
INTRAVENOUS | Status: AC
  Administered 2022-01-27: 10 mg via INTRAVENOUS
  Filled 2022-01-27: qty 10

## 2022-01-27 MED ORDER — EPINEPHRINE 1 MG/10ML IJ SOSY
PREFILLED_SYRINGE | INTRAMUSCULAR | Status: AC
  Filled 2022-01-27: qty 10

## 2022-01-27 MED ORDER — CLOPIDOGREL BISULFATE 75 MG PO TABS
75.0000 mg | ORAL_TABLET | Freq: Every day | ORAL | Status: DC
  Administered 2022-01-28 – 2022-03-17 (×49): 75 mg
  Filled 2022-01-27 (×49): qty 1

## 2022-01-27 NOTE — Progress Notes (Signed)
Patient transported on vent to 4N19 without complications. ?

## 2022-01-27 NOTE — Progress Notes (Addendum)
STROKE TEAM PROGRESS NOTE  ? ?INTERVAL HISTORY ?Patient is seen in her room with no family members at the bedside.  She has been hemodynamically stable and has had no acute events overnight.  Her neurological exam is unchanged.  Trial of Provigil continues.  Tracheostomy will be placed today at 1300 and PEG tube will be placed tomorrow ?Vitals:  ? 01/27/22 1000 01/27/22 1059 01/27/22 1100 01/27/22 1200  ?BP: (!) 180/116 (!) 156/108 (!) 147/103 (!) 128/93  ?Pulse: (!) 110 (!) 101 (!) 103 91  ?Resp: (!) 21 19 18  (!) 21  ?Temp:    98.7 ?F (37.1 ?C)  ?TempSrc:    Axillary  ?SpO2: 98% 98% 99% 98%  ?Weight:      ?Height:      ? ?CBC:  ?Recent Labs  ?Lab 01/22/22 ?7209 01/23/22 ?4709 01/24/22 ?6283 01/26/22 ?0236 01/27/22 ?0459  ?WBC 9.7 7.1   < > 10.2 11.6*  ?NEUTROABS 6.8 4.7  --   --   --   ?HGB 12.1 11.4*   < > 12.3 12.2  ?HCT 36.7 35.2*   < > 38.4 37.4  ?MCV 82.7 83.0   < > 82.9 81.8  ?PLT 238 202   < > 309 326  ? < > = values in this interval not displayed.  ? ? ?Basic Metabolic Panel:  ?Recent Labs  ?Lab 01/21/22 ?1700 01/22/22 ?6629 01/23/22 ?4765 01/26/22 ?0236 01/27/22 ?0459  ?NA  --  143   < > 147* 146*  ?K  --  4.3   < > 4.9 4.5  ?CL  --  113*   < > 109 107  ?CO2  --  23   < > 31 31  ?GLUCOSE  --  112*   < > 129* 111*  ?BUN  --  11   < > 19 22*  ?CREATININE  --  0.92   < > 0.97 0.96  ?CALCIUM  --  9.1   < > 9.5 9.6  ?MG 1.8 2.1  --   --   --   ?PHOS 3.3 4.3  --   --   --   ? < > = values in this interval not displayed.  ? ? ?Lipid Panel:  ?Recent Labs  ?Lab 01/22/22 ?0315  ?CHOL 225*  ?TRIG 181*  ?HDL 57  ?CHOLHDL 3.9  ?VLDL 36  ?LDLCALC 132*  ? ? ?HgbA1c:  ?Recent Labs  ?Lab 01/22/22 ?0315  ?HGBA1C 5.7*  ? ? ?Urine Drug Screen:  ?No results for input(s): LABOPIA, COCAINSCRNUR, LABBENZ, AMPHETMU, THCU, LABBARB in the last 168 hours. ?  ?Alcohol Level  ?No results for input(s): ETH in the last 168 hours. ? ? ?IMAGING past 24 hours ?No results found. ? ?PHYSICAL EXAM ?General:  Intubated, obese middle-aged  African-American patient in no acute distress ?Respiratory:  Respirations synchronous with ventilator ? Afebrile. Head is nontraumatic. Neck is supple  ?Neurological Exam :  ?No sedation.  Intubated.  Eyes are closed.  She does not respond to tactile or verbal stimuli but with sternal rub she gets slightly agitated . ?Pupils 61mm bilaterally and nonreactive, corneal reflexes intact, oculocephalic reflex absent, cough and gag reflexes present.  Will not follow commands but grimaces and moves all extremities to sternal rub.  No spontaneous extremity movements noted.  Plantars both mute. ? ?ASSESSMENT/PLAN ?Cindy Brooks is a 51 y.o. female with history of HTN, TIA, ETOH abuse and medication noncompliance presenting after she was found by her family to be unresponsive in  her car on 4/8.  She was moved to her couch, and when her family checked on her again on 4/9, she had not moved and EMS was called.  She was brought to the hospital and intubated for inability to protect her airways.  Workup for acute encephalopathy was started.  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  Multiple punctate acute ischemic infarcts were also seen in bilateral cerebral hemispheres.  CTA head and neck performed this morning shows severe bilateral P1 and right P2 stenoses as well as proximal left M2 stenosis and severe right ACA stenosis. ? ?Stroke: artery of Percheron infarcts and b/l hemisphere punctate embolic strokes.  Less likely Wernicke's encephalopathy. Source unclear ?CT head No acute abnormality.  ?CTA head & neck severe bilateral P1 and right P2 stenoses as well as proximal left M2 stenosis and severe right ACA stenosis, 40% stenosis of left ICA ?CTV no dural venous sinus thrombosis ?MRI  Patchy restricted diffusion in bilateral thalami and midbrain with multiple punctate acute ischemic infarcts in bilateral cerebral hemispheres and  multiple chronic microhemorrhages ?2D Echo EF 55 to 60%.  Left atrium mildly dilated. ?LE venous doppler pending ?Consider TEE once neuro further improved ?Hypercoagulable work up pending ?LDL 132 ?HgbA1c 5.7 ?VTE prophylaxis - lovenox ?No antithrombotic prior to admission, now on clopidogrel 75 mg daily. (Aspirin allergy) ?Therapy recommendations:  pending ?Disposition:  pending ? ??? Wernicke's encephalopathy ?Patient has a history of heavy alcohol use ?MRI shows DWI changes in bilateral thalami and midbrain  ( versus changes from severe wernicke`s encephalopathy less likely ) ?Thiamine supplement ?On phenobarbital for alcohol withdraw  ? ?Respiratory failure ?Patient was intubated for inability to protect airway ?Ventilator management per CCM ?Difficulty being off ventilation ?Plan for [place] today ? ?Hypertension ?Home meds:  none ?Stable ?Long-term BP goal normotensive ? ?Hyperlipidemia ?Home meds:  none ?LDL 132, goal < 70 ?Rosuvastatin 40 mg daily ?Continue statin on discharge ? ?Other Stroke Risk Factors ?Obesity, Body mass index is 30.95 kg/m?., BMI >/= 30 associated with increased stroke risk, recommend weight loss, diet and exercise as appropriate  ?Hx TIA ? ?Other Active Problems ?Suspected seizures ?Question of seizures prior arrival ?LTM EEG revealed no seizure ?On keppra IV now ? ?Risk for ETOH withdrawal ?Patient has history of heavy alcohol use ?ETOH level <10 on admission ?Phenobarbital taper per CCM ? ?Dysphagia ?On tube feeding ?Plan for PEG on Monday ? ?Hospital day # 7 ? ?[place] , MSN, AGACNP-BC ?[facility] ?See Amion for schedule and pager information ?01/27/2022 12:37 PM ? ?ATTENDING NOTE: ?I reviewed above note and agree with the assessment and plan. Pt was seen and examined.  ? ?No family at bedside.  Patient still intubated on vent, no acute event overnight, no significant neuro change.  Plan for tracheostomy today and PEG on Monday.  Continue Plavix and statin.   LE venous Doppler pending, hypercoag work-up pending.  May consider further embolic work-up with TEE once neuro further improvement. ? ?For detailed assessment and plan, please refer to above as I have made changes wherever appropriate.  ? ?Rosalin Hawking, MD PhD ?Stroke Neurology ?01/27/2022 ?4:01 PM ? ?This patient is critically ill due to multifocal embolic infarcts, alcohol abuse, respiratory failure, dysphagia, seizure-like activity and at significant risk of neurological worsening, death form recurrent stroke, hemorrhagic transformation, DT, aspiration, status epilepticus. This patient's care requires constant monitoring of vital signs, hemodynamics, respiratory and cardiac monitoring,  review of multiple databases, neurological assessment, discussion with family, other specialists and medical decision making of high complexity. I spent 30 minutes of neurocritical care time in the care of this patient.  I discussed with Dr. Tacy Learn CCM. ? ? ?  ? ?To contact Stroke Continuity provider, please refer to [email or web address] ?After hours, contact General Neurology  ?

## 2022-01-27 NOTE — Procedures (Signed)
Diagnostic Bronchoscopy ? ?Cindy Brooks  ?[id number]  ?1971/02/14 ? ?Date:01/27/22  ?Time:1:35 PM  ? ?Provider Performing:Kolter Reaver Audrie Lia  ? ?Procedure: Diagnostic Bronchoscopy (15056) ? ?Indication(s) ?Assist with direct visualization of tracheostomy placement ? ?Consent ?Risks of the procedure as well as the alternatives and risks of each were explained to the patient and/or caregiver.  Consent for the procedure was obtained. ? ? ?Anesthesia ?See separate tracheostomy note ? ? ?Time Out ?Verified patient identification, verified procedure, site/side was marked, verified correct patient position, special equipment/implants available, medications/allergies/relevant history reviewed, required imaging and test results available. ? ? ?Sterile Technique ?Usual hand hygiene, masks, gowns, and gloves were used ? ? ?Procedure Description ?Bronchoscope advanced through endotracheal tube and into airway.  After suctioning out tracheal secretions, bronchoscope used to provide direct visualization of tracheostomy placement. Confirmed location with transilluminiation of anterior tracheal wall, directly observed needle placement and correct guidewire placement along posterior tracheal wall. No complications. Correct site placement confirmed with bronchoscopy through trach tube before connecting to MV. Minimal bloody secretions suctioning from tracheonbronchial tree at the completion of the procedure. No complications. ? ? ?Complications/Tolerance ?None; patient tolerated the procedure well. ? ? ?EBL ?None ? ?Specimen(s) ?None ? ?Diagnostic Bronchoscopy ? ?Cindy Brooks  ?[id number]  ?07/22/1971 ? ?Date:01/27/22  ?Time:1:35 PM  ? ?Provider Performing:Marcellis Frampton Audrie Lia  ? ?Procedure: Diagnostic Bronchoscopy (53748) ? ?Indication(s) ?Assist with direct visualization of tracheostomy placement ? ?Consent ?Risks of the procedure as well as the alternatives and risks of each were explained to the patient and/or caregiver.  Consent for the  procedure was obtained. ? ? ?Anesthesia ?See separate tracheostomy note ? ? ?Time Out ?Verified patient identification, verified procedure, site/side was marked, verified correct patient position, special equipment/implants available, medications/allergies/relevant history reviewed, required imaging and test results available. ? ? ?Sterile Technique ?Usual hand hygiene, masks, gowns, and gloves were used ? ? ?Procedure Description ?Bronchoscope advanced through endotracheal tube and into airway.  After suctioning out tracheal secretions, bronchoscope used to provide direct visualization of tracheostomy placement. ? ? ?Complications/Tolerance ?None; patient tolerated the procedure well. ? ? ?EBL ?<5 cc ? ?Specimen(s) ?None ? ?Steffanie Dunn, DO 01/27/22 1:37 PM ?[facility] ? ?

## 2022-01-27 NOTE — Procedures (Signed)
Percutaneous Tracheostomy Procedure Note ? ? ?Cindy Brooks  ?[id number]  ?1971-03-31 ? ?Date:01/27/22  ?Time:1:36 PM  ? ?Provider Performing:Ashya Nicolaisen ? ?Procedure: Percutaneous Tracheostomy with Bronchoscopic Guidance (48185) ? ?Indication(s) ?Acute respiratory failure ? ?Consent ?Risks of the procedure as well as the alternatives and risks of each were explained to the patient and/or caregiver.  Consent for the procedure was obtained. ? ?Anesthesia ?Etomidate, Versed, Fentanyl, Vecuronium ? ? ?Time Out ?Verified patient identification, verified procedure, site/side was marked, verified correct patient position, special equipment/implants available, medications/allergies/relevant history reviewed, required imaging and test results available. ? ? ?Sterile Technique ?Maximal sterile technique including sterile barrier drape, hand hygiene, sterile gown, sterile gloves, mask, hair covering. ? ? ? ?Procedure Description ?Appropriate anatomy identified by palpation.  Patient's neck prepped and draped in sterile fashion.  1% lidocaine with epinephrine was used to anesthetize skin overlying neck.  1.5cm incision made and blunt dissection performed until tracheal rings could be easily palpated.   Then a size 6 Shiley tracheostomy was placed under bronchoscopic visualization using usual Seldinger technique and serial dilation.   Bronchoscope confirmed placement above the carina.  Tracheostomy was sutured in place with adhesive pad to protect skin under pressure.    Patient connected to ventilator. ? ? ?Complications/Tolerance ?None; patient tolerated the procedure well. ?Chest X-ray is ordered to confirm no post-procedural complication. ? ? ?EBL ?Minimal ? ? ?Specimen(s) ?None  ? ?

## 2022-01-27 NOTE — Progress Notes (Signed)
? ?NAME:  Cindy Brooks, MRN:  [id number], DOB:  1971/05/28, LOS: 7 ?ADMISSION DATE:  01/20/2022, CONSULTATION DATE: 01/20/2022 ?REFERRING MD: Triad, CHIEF COMPLAINT: Altered mental status ? ?History of Present Illness:  ?50 year old female who was found outside in her car by family on 4/8 and was moved to the house and left for 5 hours at which time she did not move. Question if she had seizures therefore emergency medical services was activated.  She is transferred to [facility] emergency department near obtunded has extremely loud respirations copious secretions and difficulty controlling her airway.  She has been seen by neurology.  She admitted to the intensive care unit for further evaluation and treatment. ? ?Pertinent  Medical History  ?HTN  ?TIA ? ?Significant Hospital Events: ?Including procedures, antibiotic start and stop dates in addition to other pertinent events   ?4/9 Admit  ?4/12 ongoing high dose thiamine for possible Wernike's encephalopathy  ?4/13 IV sedation stopped, phenobarbital wean ? ?Interim History / Subjective:  ?Remain afebrile ?Off sedation, still not waking up ?No overnight issues ? ?Objective   ?Blood pressure (!) 147/108, pulse (!) 104, temperature 98.7 ?F (37.1 ?C), temperature source Axillary, resp. rate 18, height 5\' 4"  (1.626 m), weight 81.8 kg, SpO2 94 %. ?   ?Vent Mode: PRVC ?FiO2 (%):  [40 %] 40 % ?Set Rate:  [18 bmp] 18 bmp ?Vt Set:  [440 mL] 440 mL ?PEEP:  [5 cmH20] 5 cmH20 ?Pressure Support:  [11 cmH20-12 cmH20] 12 cmH20 ?Plateau Pressure:  [15 cmH20-18 cmH20] 18 cmH20  ? ?Intake/Output Summary (Last 24 hours) at 01/27/2022 1353 ?Last data filed at 01/27/2022 1200 ?Gross per 24 hour  ?Intake 1110.03 ml  ?Output 500 ml  ?Net 610.03 ml  ? ?Filed Weights  ? 01/23/22 0458 01/26/22 0435 01/27/22 0459  ?Weight: 79.9 kg 81.7 kg 81.8 kg  ? ? ?Examination: ?  ?Physical exam: ?General: Crtitically ill-appearing female, orally intubated ?HEENT: [place]/AT, eyes anicteric.  ETT and OGT in  place ?Neuro: Eyes closed, does not open, pupils mid dilated fixed, positive corneal, cough and gag.  Withdrawing in all 4 extremities with painful stimuli ?Chest: Coarse breath sounds, no wheezes or rhonchi ?Heart: Regular rhythm, tachycardic, no murmurs or gallops ?Abdomen: Soft, nontender, nondistended, bowel sounds present ?Skin: No rash ? ?Resolved Hospital Problem list   ? ? ?Assessment & Plan:  ?Acute midbrain/bilateral thalami stroke ?Continue to watch every hour ?Stroke team is following ?Continue secondary stroke prophylaxis ?On Plavix and statin ?Allergic to aspirin ? ?Acute Metabolic Encephalopathy / Possible Wernicke's  ?Alcohol Abuse and reported seizure activity ?EEG negative for seizure ?Encephalopathy could be related to bilateral thalamic stroke ?Continue thiamine, folate and multivitamin ?Repeat MRI in 2 weeks per neurology recommendations ?Patient completed phenobarbital protocol for alcohol withdrawal ?Avoid sedation with RASS goal 0 ? ?Acute Hypoxemic Respiratory Failure ?Patient failed spontaneous breathing trial this morning with low tidal volumes ?Continue lung protective ventilation ?We will proceed with tracheostomy, consent has been signed by her daughters ?VAP bundle ? ?Acute UTI ?UA suggestive of UTI ?Urine culture grew less than 100,000 colonies ?Completed antibiotic therapy ? ?Hypertension ?SBP goal less than 140 ?Continue amlodipine ? ?Hypernatremia ?Continue free water  ?Trend serum sodium ? ?Best Practice (right click and "Reselect all SmartList Selections" daily)  ?Diet/type: NPO with tube feeds ?DVT prophylaxis: LMWH ?GI prophylaxis: PPI ?Lines: N/A ?Foley:  N/A ?Code Status:  full code ?Last date of multidisciplinary goals of care discussion: daughters (2 in person) updated at bedside 4/14, please see  plan of care note ? ? ?Critical care time:   ? ?Total critical care time: 32 minutes ? ?Performed by: Jacky Kindle ?  ?Critical care time was exclusive of separately billable  procedures and treating other patients. ?  ?Critical care was necessary to treat or prevent imminent or life-threatening deterioration. ?  ?Critical care was time spent personally by me on the following activities: development of treatment plan with patient and/or surrogate as well as nursing, discussions with consultants, evaluation of patient's response to treatment, examination of patient, obtaining history from patient or surrogate, ordering and performing treatments and interventions, ordering and review of laboratory studies, ordering and review of radiographic studies, pulse oximetry and re-evaluation of patient's condition. ?  ?Jacky Kindle MD ?[facility] ?See Amion for pager ?If no response to pager, please call [phone] until 7pm ?After 7pm, Please call E-link [phone] ? ? ? ? ? ?

## 2022-01-28 ENCOUNTER — Encounter (HOSPITAL_COMMUNITY)
Admission: EM | Disposition: A | Discharge status: Home Health | Payer: Self-pay | Source: Home / Self Care | Attending: Internal Medicine

## 2022-01-28 ENCOUNTER — Encounter (HOSPITAL_COMMUNITY): Payer: Self-pay | Admitting: Pulmonary Disease

## 2022-01-28 ENCOUNTER — Inpatient Hospital Stay (HOSPITAL_COMMUNITY): Payer: No Typology Code available for payment source

## 2022-01-28 DIAGNOSIS — N179 Acute kidney failure, unspecified: Secondary | ICD-10-CM

## 2022-01-28 DIAGNOSIS — J9690 Respiratory failure, unspecified, unspecified whether with hypoxia or hypercapnia: Secondary | ICD-10-CM | POA: Diagnosis present

## 2022-01-28 DIAGNOSIS — Z931 Gastrostomy status: Secondary | ICD-10-CM

## 2022-01-28 DIAGNOSIS — N390 Urinary tract infection, site not specified: Secondary | ICD-10-CM

## 2022-01-28 DIAGNOSIS — R569 Unspecified convulsions: Secondary | ICD-10-CM

## 2022-01-28 DIAGNOSIS — Z930 Tracheostomy status: Secondary | ICD-10-CM

## 2022-01-28 DIAGNOSIS — I639 Cerebral infarction, unspecified: Secondary | ICD-10-CM

## 2022-01-28 DIAGNOSIS — J9601 Acute respiratory failure with hypoxia: Secondary | ICD-10-CM | POA: Diagnosis present

## 2022-01-28 DIAGNOSIS — I6381 Other cerebral infarction due to occlusion or stenosis of small artery: Secondary | ICD-10-CM

## 2022-01-28 DIAGNOSIS — I10 Essential (primary) hypertension: Secondary | ICD-10-CM | POA: Diagnosis present

## 2022-01-28 DIAGNOSIS — E878 Other disorders of electrolyte and fluid balance, not elsewhere classified: Secondary | ICD-10-CM

## 2022-01-28 HISTORY — DX: Unspecified convulsions: R56.9

## 2022-01-28 HISTORY — DX: Urinary tract infection, site not specified: N39.0

## 2022-01-28 HISTORY — PX: PEG PLACEMENT: SHX5437

## 2022-01-28 HISTORY — PX: ESOPHAGOGASTRODUODENOSCOPY: SHX5428

## 2022-01-28 LAB — BASIC METABOLIC PANEL
Anion gap: 7 (ref 5–15)
BUN: 28 mg/dL — ABNORMAL HIGH (ref 6–20)
CO2: 30 mmol/L (ref 22–32)
Calcium: 9.8 mg/dL (ref 8.9–10.3)
Chloride: 112 mmol/L — ABNORMAL HIGH (ref 98–111)
Creatinine, Ser: 1.12 mg/dL — ABNORMAL HIGH (ref 0.44–1.00)
GFR, Estimated: 60 mL/min — ABNORMAL LOW (ref >60)
Glucose, Bld: 106 mg/dL — ABNORMAL HIGH (ref 70–99)
Potassium: 4.9 mmol/L (ref 3.5–5.1)
Sodium: 149 mmol/L — ABNORMAL HIGH (ref 135–145)

## 2022-01-28 LAB — CBC
HCT: 38.7 % (ref 36.0–46.0)
HCT: 45.1 % (ref 36.0–46.0)
Hemoglobin: 12.7 g/dL (ref 12.0–15.0)
Hemoglobin: 14.6 g/dL (ref 12.0–15.0)
MCH: 26.9 pg (ref 26.0–34.0)
MCH: 27.2 pg (ref 26.0–34.0)
MCHC: 32.8 g/dL (ref 30.0–36.0)
MCHC: 32.4 g/dL (ref 30.0–36.0)
MCV: 82 fL (ref 80.0–100.0)
MCV: 84.1 fL (ref 80.0–100.0)
Platelets: 323 10*3/uL (ref 150–400)
Platelets: 355 10*3/uL (ref 150–400)
RBC: 4.72 MIL/uL (ref 3.87–5.11)
RBC: 5.36 MIL/uL — ABNORMAL HIGH (ref 3.87–5.11)
RDW: 15.3 % (ref 11.5–15.5)
RDW: 15.3 % (ref 11.5–15.5)
WBC: 11.8 10*3/uL — ABNORMAL HIGH (ref 4.0–10.5)
WBC: 17.5 10*3/uL — ABNORMAL HIGH (ref 4.0–10.5)
nRBC: 0 % (ref 0.0–0.2)
nRBC: 0 % (ref 0.0–0.2)

## 2022-01-28 LAB — BETA-2-GLYCOPROTEIN I ABS, IGG/M/A
Beta-2 Glyco I IgG: <9 GPI IgG units (ref 0–20)
Beta-2-Glycoprotein I IgA: <9 GPI IgA units (ref 0–25)
Beta-2-Glycoprotein I IgM: <9 GPI IgM units (ref 0–32)

## 2022-01-28 LAB — CARDIOLIPIN ANTIBODIES, IGG, IGM, IGA
Anticardiolipin IgA: <9 APL U/mL (ref 0–11)
Anticardiolipin IgG: 10 GPL U/mL (ref 0–14)
Anticardiolipin IgM: 26 MPL U/mL — ABNORMAL HIGH (ref 0–12)

## 2022-01-28 LAB — GLUCOSE, CAPILLARY
Glucose-Capillary: 105 mg/dL — ABNORMAL HIGH (ref 70–99)
Glucose-Capillary: 108 mg/dL — ABNORMAL HIGH (ref 70–99)
Glucose-Capillary: 111 mg/dL — ABNORMAL HIGH (ref 70–99)
Glucose-Capillary: 116 mg/dL — ABNORMAL HIGH (ref 70–99)
Glucose-Capillary: 120 mg/dL — ABNORMAL HIGH (ref 70–99)
Glucose-Capillary: 94 mg/dL (ref 70–99)

## 2022-01-28 LAB — ANA: Anti Nuclear Antibody (ANA): POSITIVE — AB

## 2022-01-28 LAB — LACTIC ACID, PLASMA
Lactic Acid, Venous: 3.7 mmol/L (ref 0.5–1.9)
Lactic Acid, Venous: 3 mmol/L (ref 0.5–1.9)

## 2022-01-28 LAB — HOMOCYSTEINE: Homocysteine: 12 umol/L (ref 0.0–14.5)

## 2022-01-28 SURGERY — EGD (ESOPHAGOGASTRODUODENOSCOPY)
Anesthesia: Moderate Sedation

## 2022-01-28 MED ORDER — LACTATED RINGERS IV BOLUS
1000.0000 mL | Freq: Once | INTRAVENOUS | Status: AC
  Administered 2022-01-28: 1000 mL via INTRAVENOUS

## 2022-01-28 MED ORDER — MIDAZOLAM HCL 2 MG/2ML IJ SOLN
INTRAMUSCULAR | Status: AC
Start: 2022-01-28 — End: 2022-01-28
  Administered 2022-01-28: 2 mg
  Filled 2022-01-28: qty 2

## 2022-01-28 MED ORDER — FREE WATER: 200.0000 mL | Status: DC

## 2022-01-28 MED ORDER — DEXTROSE 5 % IV SOLN: INTRAVENOUS | Status: DC

## 2022-01-28 MED ORDER — MIDAZOLAM HCL 2 MG/2ML IJ SOLN: 2.0000 mg | Freq: Once | INTRAMUSCULAR | Status: DC

## 2022-01-28 NOTE — Evaluation (Signed)
Physical Therapy Evaluation ?Patient Details ?Name: Cindy Brooks ?MRN: [id number] ?DOB: 04/21/1971 ?Today's Date: 01/28/2022 ? ?History of Present Illness ? Pt is a 51 y.o. female with history of HTN, TIA, ETOH abuse and medication noncompliance presenting to the ED after she was found by her family to be unresponsive in her car on 4/8. 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 Intubated 4/9. Tracheostormy 4/16. ?  ?Clinical Impression ? Pt admitted with above diagnosis. PTA pt lived at home. Pt unable to provide history, but through chart review able to discern pt was at least independent with household mobility. Pt currently with functional limitations due to the deficits listed below (see PT Problem List). On eval, pt required total assist bed mobility. No active movement noted BUE/LE but PROM intact. Eyes closed throughout session. Non-responsive to noxious stimuli. Vent support via trach. Pt will benefit from skilled PT to increase their independence and safety with mobility to allow discharge to the venue listed below.   ?   ?   ? ?Recommendations for follow up therapy are one component of a multi-disciplinary discharge planning process, led by the attending physician.  Recommendations may be updated based on patient status, additional functional criteria and insurance authorization. ? ?Follow Up Recommendations Skilled nursing-short term rehab (<3 hours/day) ? ?  ?Assistance Recommended at Discharge Frequent or constant Supervision/Assistance  ?Patient can return home with the following ?   ? ?  ?Equipment Recommendations Other (comment) (TBD)  ?Recommendations for Other Services ?    ?  ?Functional Status Assessment Patient has had a recent decline in their functional status and/or demonstrates limited ability to make significant  improvements in function in a reasonable and predictable amount of time  ? ?  ?Precautions / Restrictions Precautions ?Precautions: Fall;Other (comment) ?Precaution Comments: vent via trach ?Restrictions ?Weight Bearing Restrictions: No  ? ?  ? ?Mobility ? Bed Mobility ?Overal bed mobility: Needs Assistance ?Bed Mobility: Rolling ?Rolling: Total assist ?  ?  ?  ?  ?  ?  ? ?Transfers ?  ?  ?  ?  ?  ?  ?  ?  ?  ?  ?  ? ?Ambulation/Gait ?  ?  ?  ?  ?  ?  ?  ?  ? ?Stairs ?  ?  ?  ?  ?  ? ?Wheelchair Mobility ?  ? ?Modified Rankin (Stroke Patients Only) ?Modified Rankin (Stroke Patients Only) ?Pre-Morbid Rankin Score: No symptoms ?Modified Rankin: Severe disability ? ?  ? ?Balance   ?  ?  ?  ?  ?  ?  ?  ?  ?  ?  ?  ?  ?  ?  ?  ?  ?  ?  ?   ? ? ? ?Pertinent Vitals/Pain Pain Assessment ?Facial Expression: Relaxed, neutral ?Body Movements: Absence of movements ?Muscle Tension: Relaxed ?Compliance with ventilator (intubated pts.): Tolerating ventilator or movement ?Vocalization (extubated pts.): N/A ?CPOT Total: 0  ? ? ?Home Living Family/patient expects to be discharged to:: Private residence ?  ?  ?  ?  ?  ?  ?  ?  ?  ?Additional Comments: Pt unable to provide history. No family present.  ?  ?Prior Function   ?  ?  ?  ?  ?  ?  ?  ?  ?  ? ? ?Hand Dominance  ?   ? ?  ?  Extremity/Trunk Assessment  ? Upper Extremity Assessment ?Upper Extremity Assessment:  (no active movement noted. PROM WFL) ?  ? ?Lower Extremity Assessment ?Lower Extremity Assessment:  (no active movement noted. PROM WFL) ?  ? ?   ?Communication  ? Communication: Tracheostomy  ?Cognition Arousal/Alertness: Lethargic ?Behavior During Therapy: Flat affect ?Overall Cognitive Status: Difficult to assess ?  ?  ?  ?  ?  ?  ?  ?  ?  ?  ?  ?  ?  ?  ?  ?  ?General Comments: Eyes closed. No response to noxious stimuli. ?  ?  ? ?  ?General Comments General comments (skin integrity, edema, etc.): HR 102, BP 153/124, full vent support via tracheostomy ? ?  ?Exercises     ? ?Assessment/Plan  ?  ?PT Assessment Patient needs continued PT services  ?PT Problem List Decreased strength;Decreased mobility;Decreased activity tolerance;Decreased cognition;Cardiopulmonary status limiting activity;Decreased balance;Decreased knowledge of use of DME ? ?   ?  ?PT Treatment Interventions Therapeutic activities;Cognitive remediation;Therapeutic exercise;Patient/family education;Balance training;Functional mobility training;Neuromuscular re-education   ? ?PT Goals (Current goals can be found in the Care Plan section)  ?Acute Rehab PT Goals ?Patient Stated Goal: unable to state ?PT Goal Formulation: Patient unable to participate in goal setting ?Time For Goal Achievement: 02/11/22 ?Potential to Achieve Goals: Poor ? ?  ?Frequency Min 2X/week ?  ? ? ?Co-evaluation   ?  ?  ?  ?  ? ? ?  ?AM-PAC PT "6 Clicks" Mobility  ?Outcome Measure Help needed turning from your back to your side while in a flat bed without using bedrails?: Total ?Help needed moving from lying on your back to sitting on the side of a flat bed without using bedrails?: Total ?Help needed moving to and from a bed to a chair (including a wheelchair)?: Total ?Help needed standing up from a chair using your arms (e.g., wheelchair or bedside chair)?: Total ?Help needed to walk in hospital room?: Total ?Help needed climbing 3-5 steps with a railing? : Total ?6 Click Score: 6 ? ?  ?End of Session Equipment Utilized During Treatment: Oxygen (vent/trach) ?Activity Tolerance: Other (comment) (nonresponsive, eyes closed, no active movement) ?Patient left: in bed;with call bell/phone within reach ?Nurse Communication: Mobility status ?PT Visit Diagnosis: Other abnormalities of gait and mobility (R26.89) ?  ? ?Time: 6568-1275 ?PT Time Calculation (min) (ACUTE ONLY): 12 min ? ? ?Charges:   PT Evaluation ?$PT Eval Moderate Complexity: 1 Mod ?  ?  ?   ? ? ?.we ?Aida Raider, PT  ?Office # [phone] ?Pager [phone] ? ?Ilda Foil ?01/28/2022,  10:40 AM ? ?

## 2022-01-28 NOTE — Procedures (Signed)
? ?  Procedure Note ? ?Date: 01/28/2022 ? ?Procedure: esophagogastroduodenoscopy (EGD) and percutaneous endoscopic gastrostomy (PEG) tube placement ? ?Pre-op diagnosis: dysphagia, stroke ?Post-op diagnosis: same ? ?Indication and clinical history: 33F s/p stroke ? ?Surgeon: Jesusita Oka, MD ?Assistant: Rodolph Bong, PA ? ?Anesthesia: MAC ? ?Findings: normal esophagus, stomach, duodenum ?Specimen: none ?EBL: <5cc ?Drains/Implants: PEG tube,  4.5 cm at the skin  ? ?Disposition: ICU/PACU ? ?Description of Procedure: The patient was positioned semi-recumbent. Time-out was performed verifying correct patient, procedure, signature of informed consent, and pre-operative antibiotics as indicated. MAC induction was uneventful and a bite block was placed into the oropharynx. The endoscope was inserted into the oropharynx and advanced down the esophagus into the stomach and into the duodenum. The visualized esophagus and duodenum were unremarkable. The endoscope was retracted back into the stomach and the stomach was insufflated. The stomach was inspected and was also normal. Transillumination was performed. The light was visible on the external skin and dimpling of the stomach was noted endoscopically with manual pressure. The abdomen was prepped and draped in the usual sterile fashion. Transillumination and dimpling were repeated and local anesthetic was infiltrated to make a skin wheal at the site of transillumination. The needle was inserted perpendicularly to the skin and the tip of the needle was visualized endoscopically. As the needle was retracted, the tract was also anesthetized. A skin nick was made at the site of the wheal and an introducer needle and sheath were inserted. The needle was removed and guidewire inserted. The guidewire was grasped by an endoscopic snare and the snare, guidewire, and endoscope retracted out of the oropharynx. The PEG tube was secured to the guidewire and retracted through the mouth and  esophagus into the stomach. The PEG tube was secured with a bolster and was visualized endoscopically to spin freely circumferentially and also be without gaps between the internal bumper and the stomach wall. There was no evidence of bleeding. The PEG bolster was secured at  4.5 cm at the skin and there were no gaps between the bolster and the abdominal wall. The stomach was desufflated endoscopically and the endoscope removed. The bite block was also removed. The patient tolerated the procedure well and there were no complications.  ? ?The patient may have water and medications administered via the PEG tube beginning immediately and tube feeds may be initiated four hours post-procedure.  ? ? ?Jesusita Oka, MD ?General and Trauma Surgery ?[facility] ? ?

## 2022-01-28 NOTE — Progress Notes (Signed)
SLP Cancellation Note ? ?Patient Details ?Name: Cindy Brooks ?MRN: [id number] ?DOB: Jun 09, 1971 ? ? ?Cancelled treatment:       Reason Eval/Treat Not Completed: Patient not medically ready Patient with new tracheostomy 4/16 and os currently on the vent. Orders for SLP eval and treat for PMSV and swallowing received. Will follow pt closely for readiness for SLP interventions as appropriate.  ? ?Rayssa Atha I. Vear Clock, MS, CCC-SLP ?Acute Rehabilitation Services ?Office number [phone] ?Pager [phone] ? ? ?Scheryl Marten ?01/28/2022, 8:31 AM ?

## 2022-01-28 NOTE — Progress Notes (Signed)
Bilateral lower extremity venous duplex completed. ?Refer to "CV Proc" under chart review to view preliminary results. ? ?01/28/2022 3:01 PM ?Eula Fried., MHA, RVT, RDCS, RDMS   ?

## 2022-01-28 NOTE — Consult Note (Signed)
? ? ?Reason for Consult/Chief Complaint: stroke with dysphagia ?Consultant: Tacy Learn, MD ? ?Cindy Brooks is an 51 y.o. female.  ? ?HPI: 44F found outside her car and left for five hours. Question so seizure activity and presented obtunded. Ultimately diagnosed with midbrain and b/l thalamic strokes as well as metabolic encephalopathy. She remains obtunded and was trached 4/16. ? ?Past Medical History:  ?Diagnosis Date  ? Hypertension   ? Hypertensive urgency 01/20/2022  ? Seizure ([place]) 01/28/2022  ? TIA (transient ischemic attack) 2019  ? Urinary tract infection 01/28/2022  ? ? ?History reviewed. No pertinent surgical history. ? ?Family History  ?Problem Relation Age of Onset  ? Diabetes Mellitus II Maternal Grandmother   ? ? ?Social History:  reports that she has never smoked. She has never used smokeless tobacco. She reports current alcohol use of about 203.0 standard drinks per week. She reports that she does not use drugs. ? ?Allergies:  ?Allergies  ?Allergen Reactions  ? Aspirin Hives and Rash  ? Codeine Hives and Rash  ? Penicillins Anaphylaxis, Swelling and Other (See Comments)  ? Strawberry Extract Anaphylaxis and Hives  ? ? ?Medications: I have reviewed the patient's current medications. ? ?Results for orders placed or performed during the hospital encounter of 01/20/22 (from the past 48 hour(s))  ?Glucose, capillary     Status: Abnormal  ? Collection Time: 01/26/22 11:26 AM  ?Result Value Ref Range  ? Glucose-Capillary 157 (H) 70 - 99 mg/dL  ?  Comment: Glucose reference range applies only to samples taken after fasting for at least 8 hours.  ?Glucose, capillary     Status: Abnormal  ? Collection Time: 01/26/22  3:14 PM  ?Result Value Ref Range  ? Glucose-Capillary 149 (H) 70 - 99 mg/dL  ?  Comment: Glucose reference range applies only to samples taken after fasting for at least 8 hours.  ?Glucose, capillary     Status: Abnormal  ? Collection Time: 01/26/22  7:31 PM  ?Result Value Ref Range  ? Glucose-Capillary  148 (H) 70 - 99 mg/dL  ?  Comment: Glucose reference range applies only to samples taken after fasting for at least 8 hours.  ?Glucose, capillary     Status: Abnormal  ? Collection Time: 01/26/22 11:21 PM  ?Result Value Ref Range  ? Glucose-Capillary 137 (H) 70 - 99 mg/dL  ?  Comment: Glucose reference range applies only to samples taken after fasting for at least 8 hours.  ?Glucose, capillary     Status: Abnormal  ? Collection Time: 01/27/22  3:23 AM  ?Result Value Ref Range  ? Glucose-Capillary 104 (H) 70 - 99 mg/dL  ?  Comment: Glucose reference range applies only to samples taken after fasting for at least 8 hours.  ?Basic metabolic panel     Status: Abnormal  ? Collection Time: 01/27/22  4:59 AM  ?Result Value Ref Range  ? Sodium 146 (H) 135 - 145 mmol/L  ? Potassium 4.5 3.5 - 5.1 mmol/L  ? Chloride 107 98 - 111 mmol/L  ? CO2 31 22 - 32 mmol/L  ? Glucose, Bld 111 (H) 70 - 99 mg/dL  ?  Comment: Glucose reference range applies only to samples taken after fasting for at least 8 hours.  ? BUN 22 (H) 6 - 20 mg/dL  ? Creatinine, Ser 0.96 0.44 - 1.00 mg/dL  ? Calcium 9.6 8.9 - 10.3 mg/dL  ? GFR, Estimated >60 >60 mL/min  ?  Comment: (NOTE) ?Calculated using the CKD-EPI Creatinine Equation (  2021) ?  ? Anion gap 8 5 - 15  ?  Comment: Performed at [facility], [place] [address], [place], [place] [address]  ?CBC     Status: Abnormal  ? Collection Time: 01/27/22  4:59 AM  ?Result Value Ref Range  ? WBC 11.6 (H) 4.0 - 10.5 K/uL  ? RBC 4.57 3.87 - 5.11 MIL/uL  ? Hemoglobin 12.2 12.0 - 15.0 g/dL  ? HCT 37.4 36.0 - 46.0 %  ? MCV 81.8 80.0 - 100.0 fL  ? MCH 26.7 26.0 - 34.0 pg  ? MCHC 32.6 30.0 - 36.0 g/dL  ? RDW 15.4 11.5 - 15.5 %  ? Platelets 326 150 - 400 K/uL  ? nRBC 0.0 0.0 - 0.2 %  ?  Comment: Performed at [facility], [place] [address], [place], [place] [address]  ?TSH     Status: None  ? Collection Time: 01/27/22  4:59 AM  ?Result Value Ref Range  ? TSH 1.227 0.350 - 4.500 uIU/mL  ?  Comment: Performed by a 3rd  Generation assay with a functional sensitivity of <=0.01 uIU/mL. ?Performed at [facility], [place] [address] [place], [place] [address] ?  ?RPR     Status: None  ? Collection Time: 01/27/22  4:59 AM  ?Result Value Ref Range  ? RPR Ser Ql NON REACTIVE NON REACTIVE  ?  Comment: Performed at [facility], [place] [address] [place],  [address]  ?Glucose, capillary     Status: Abnormal  ? Collection Time: 01/27/22  7:33 AM  ?Result Value Ref Range  ? Glucose-Capillary 115 (H) 70 - 99 mg/dL  ?  Comment: Glucose reference range applies only to samples taken after fasting for at least 8 hours.  ?Glucose, capillary     Status: Abnormal  ? Collection Time: 01/27/22 11:11 AM  ?Result Value Ref Range  ? Glucose-Capillary 131 (H) 70 - 99 mg/dL  ?  Comment: Glucose reference range applies only to samples taken after fasting for at least 8 hours.  ?Glucose, capillary     Status: Abnormal  ? Collection Time: 01/27/22  3:01 PM  ?Result Value Ref Range  ? Glucose-Capillary 111 (H) 70 - 99 mg/dL  ?  Comment: Glucose reference range applies only to samples taken after fasting for at least 8 hours.  ?Glucose, capillary     Status: Abnormal  ? Collection Time: 01/27/22  3:14 PM  ?Result Value Ref Range  ? Glucose-Capillary 109 (H) 70 - 99 mg/dL  ?  Comment: Glucose reference range applies only to samples taken after fasting for at least 8 hours.  ?Glucose, capillary     Status: Abnormal  ? Collection Time: 01/27/22  7:33 PM  ?Result Value Ref Range  ? Glucose-Capillary 106 (H) 70 - 99 mg/dL  ?  Comment: Glucose reference range applies only to samples taken after fasting for at least 8 hours.  ?Glucose, capillary     Status: Abnormal  ? Collection Time: 01/27/22 11:33 PM  ?Result Value Ref Range  ? Glucose-Capillary 122 (H) 70 - 99 mg/dL  ?  Comment: Glucose reference range applies only to samples taken after fasting for at least 8 hours.  ?Basic metabolic panel     Status: Abnormal  ? Collection Time: 01/28/22  3:06 AM   ?Result Value Ref Range  ? Sodium 149 (H) 135 - 145 mmol/L  ? Potassium 4.9 3.5 - 5.1 mmol/L  ? Chloride 112 (H) 98 -  111 mmol/L  ? CO2 30 22 - 32 mmol/L  ? Glucose, Bld 106 (H) 70 - 99 mg/dL  ?  Comment: Glucose reference range applies only to samples taken after fasting for at least 8 hours.  ? BUN 28 (H) 6 - 20 mg/dL  ? Creatinine, Ser 1.12 (H) 0.44 - 1.00 mg/dL  ? Calcium 9.8 8.9 - 10.3 mg/dL  ? GFR, Estimated 60 (L) >60 mL/min  ?  Comment: (NOTE) ?Calculated using the CKD-EPI Creatinine Equation (2021) ?  ? Anion gap 7 5 - 15  ?  Comment: Performed at [facility], [place] [address] [place], [place] [address]  ?CBC     Status: Abnormal  ? Collection Time: 01/28/22  3:06 AM  ?Result Value Ref Range  ? WBC 11.8 (H) 4.0 - 10.5 K/uL  ? RBC 4.72 3.87 - 5.11 MIL/uL  ? Hemoglobin 12.7 12.0 - 15.0 g/dL  ? HCT 38.7 36.0 - 46.0 %  ? MCV 82.0 80.0 - 100.0 fL  ? MCH 26.9 26.0 - 34.0 pg  ? MCHC 32.8 30.0 - 36.0 g/dL  ? RDW 15.3 11.5 - 15.5 %  ? Platelets 323 150 - 400 K/uL  ? nRBC 0.0 0.0 - 0.2 %  ?  Comment: Performed at [facility], [place] [address] [place], [place] [address]  ?Glucose, capillary     Status: Abnormal  ? Collection Time: 01/28/22  3:30 AM  ?Result Value Ref Range  ? Glucose-Capillary 111 (H) 70 - 99 mg/dL  ?  Comment: Glucose reference range applies only to samples taken after fasting for at least 8 hours.  ?Glucose, capillary     Status: Abnormal  ? Collection Time: 01/28/22  7:49 AM  ?Result Value Ref Range  ? Glucose-Capillary 105 (H) 70 - 99 mg/dL  ?  Comment: Glucose reference range applies only to samples taken after fasting for at least 8 hours.  ? ? ?DG Chest Port 1 View ? ?Result Date: 01/27/2022 ?CLINICAL DATA:  Status post tracheostomy. EXAM: PORTABLE CHEST 1 VIEW COMPARISON:  Chest x-ray from yesterday. FINDINGS: New tracheostomy tube with tip in good position 3.1 cm above the carina. Interval removal of the enteric tube. Stable cardiomediastinal silhouette. Unchanged retrocardiac  left lower lobe opacity. No pneumothorax or large pleural effusion. No acute osseous abnormality. IMPRESSION: 1. New tracheostomy tube in good position. 2. Unchanged retrocardiac left lower lobe opacity,

## 2022-01-28 NOTE — Progress Notes (Addendum)
STROKE TEAM PROGRESS NOTE  ? ?INTERVAL HISTORY ?Patient just had PEG placed by trauma today. She had trach yesterday and still on vent. Now on weaning. Neuro unchanged.  ? ?Vitals:  ? 01/28/22 0739 01/28/22 0800 01/28/22 1150 01/28/22 1200  ?BP:  (!) 151/109 (!) 151/106   ?Pulse: (!) 104 (!) 102 (!) 101   ?Resp: 18 18 (!) 26   ?Temp:  100.3 ?F (37.9 ?C)  98.9 ?F (37.2 ?C)  ?TempSrc:  Axillary  Axillary  ?SpO2: 100% 99% 99%   ?Weight:      ?Height:      ? ?CBC:  ?Recent Labs  ?Lab 01/22/22 ?[id number] 01/23/22 ?[id number] 01/24/22 ?[id number] 01/27/22 ?[id number] 01/28/22 ?[id number]  ?WBC 9.7 7.1   < > 11.6* 11.8*  ?NEUTROABS 6.8 4.7  --   --   --   ?HGB 12.1 11.4*   < > 12.2 12.7  ?HCT 36.7 35.2*   < > 37.4 38.7  ?MCV 82.7 83.0   < > 81.8 82.0  ?PLT 238 202   < > 326 323  ? < > = values in this interval not displayed.  ? ?Basic Metabolic Panel:  ?Recent Labs  ?Lab 01/21/22 ?1700 01/22/22 ?[id number] 01/23/22 ?[id number] 01/27/22 ?[id number] 01/28/22 ?0306  ?NA  --  143   < > 146* 149*  ?K  --  4.3   < > 4.5 4.9  ?CL  --  113*   < > 107 112*  ?CO2  --  23   < > 31 30  ?GLUCOSE  --  112*   < > 111* 106*  ?BUN  --  11   < > 22* 28*  ?CREATININE  --  0.92   < > 0.96 1.12*  ?CALCIUM  --  9.1   < > 9.6 9.8  ?MG 1.8 2.1  --   --   --   ?PHOS 3.3 4.3  --   --   --   ? < > = values in this interval not displayed.  ? ?Lipid Panel:  ?Recent Labs  ?Lab 01/22/22 ?0315  ?CHOL 225*  ?TRIG 181*  ?HDL 57  ?CHOLHDL 3.9  ?VLDL 36  ?[place] 132*  ? ?HgbA1c:  ?Recent Labs  ?Lab 01/22/22 ?0315  ?HGBA1C 5.7*  ? ?Urine Drug Screen:  ?No results for input(s): LABOPIA, COCAINSCRNUR, LABBENZ, AMPHETMU, THCU, LABBARB in the last 168 hours. ?  ?Alcohol Level  ?No results for input(s): ETH in the last 168 hours. ? ? ?IMAGING past 24 hours ?DG Chest Port 1 View ? ?Result Date: 01/27/2022 ?CLINICAL DATA:  Status post tracheostomy. EXAM: PORTABLE CHEST 1 VIEW COMPARISON:  Chest x-ray from yesterday. FINDINGS: New tracheostomy tube with tip in good position 3.1 cm above the carina. Interval  removal of the enteric tube. Stable cardiomediastinal silhouette. Unchanged retrocardiac left lower lobe opacity. No pneumothorax or large pleural effusion. No acute osseous abnormality. IMPRESSION: 1. New tracheostomy tube in good position. 2. Unchanged retrocardiac left lower lobe opacity, favor atelectasis. Electronically Signed   By: Titus Dubin M.D.   On: 01/27/2022 14:09   ? ?PHYSICAL EXAM ?General:  Intubated, obese middle-aged African-American patient in no acute distress ?Respiratory:  Respirations synchronous with ventilator, s/p trach ? Afebrile. Head is nontraumatic. Neck is supple. S/p PEG ?Neurological Exam :  ?No sedation.  Intubated.  Eyes are closed.  She does not respond to tactile or verbal stimuli but with sternal rub she gets slightly agitated . ?Pupils 72mm bilaterally and nonreactive, corneal reflexes  intact, oculocephalic reflex absent, cough and gag reflexes present.  Not follow commands.  No spontaneous extremity movements noted.  Plantars both mute. With painful stimuli, slight withdraw BUEs and BLEs.  ? ?ASSESSMENT/PLAN ?[name] is a 51 y.o. female with history of HTN, TIA, ETOH abuse and medication noncompliance presenting after she was found by her family to be unresponsive in her car on 4/8.  She was moved to her couch, and when her family checked on her again on 4/9, she had not moved and EMS was called.  She was brought to the hospital and intubated for inability to protect her airways.  Workup for acute encephalopathy was started.  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  Multiple punctate acute ischemic infarcts were also seen in bilateral cerebral hemispheres.  CTA head and neck performed this morning shows severe bilateral P1 and right P2 stenoses as well as proximal left M2 stenosis and severe right ACA stenosis. ? ?Stroke: artery of Percheron infarcts and b/l hemisphere  punctate embolic strokes.  Less likely Wernicke's encephalopathy. Source unclear ?CT head No acute abnormality.  ?CTA head & neck severe bilateral P1 and right P2 stenoses as well as proximal left M2 stenosis and severe right ACA stenosis, 40% stenosis of left ICA ?CTV no dural venous sinus thrombosis ?MRI  Patchy restricted diffusion in bilateral thalami and midbrain with multiple punctate acute ischemic infarcts in bilateral cerebral hemispheres and multiple chronic microhemorrhages ?2D Echo EF 55 to 60%.  Left atrium mildly dilated. ?LE venous doppler pending ?May consider TEE if neuro has significant improvement ?Hypercoagulable work up pending ?LDL 132 ?HgbA1c 5.7 ?ANA positive - titre pending ?VTE prophylaxis - lovenox ?No antithrombotic prior to admission, now on clopidogrel 75 mg daily. (Aspirin allergy) ?Therapy recommendations:  pending ?Disposition:  pending ? ??? Wernicke's encephalopathy ?Patient has a history of heavy alcohol use ?MRI shows DWI changes in bilateral thalami and midbrain  ( versus changes from severe wernicke`s encephalopathy less likely ) ?Thiamine supplement ?On phenobarbital for alcohol withdraw  ? ?Respiratory failure ?Patient was intubated for inability to protect airway ?Ventilator management per CCM ?Difficulty being off ventilation ?S/p Trach 4/16, still on vent ? ?Hypertension ?Home meds:  none ?Stable ?Long-term BP goal normotensive ? ?Hyperlipidemia ?Home meds:  none ?LDL 132, goal < 70 ?Rosuvastatin 40 mg daily ?Continue statin on discharge ? ?Other Stroke Risk Factors ?Obesity, Body mass index is 29.37 kg/m?., BMI >/= 30 associated with increased stroke risk, recommend weight loss, diet and exercise as appropriate  ?Hx TIA ? ?Other Active Problems ?Suspected seizures ?Question of seizures prior arrival ?LTM EEG revealed no seizure ?On keppra IV now -> change to per tube once PEG able to use ? ?Risk for ETOH withdrawal ?Patient has history of heavy alcohol use ?ETOH level <10  on admission ?Phenobarbital taper per CCM ? ?Dysphagia ?On tube feeding ?S/p PEG 4/17 ? ?Hospital day # 8 ? ? ?Rosalin Hawking, MD PhD ?Stroke Neurology ?01/28/2022 ?12:59 PM ? ?This patient is critically ill due to multifocal embolic infarcts, alcohol abuse, respiratory failure, dysphagia, seizure-like activity and at significant risk of neurological worsening, death form recurrent stroke, hemorrhagic transformation, DT, aspiration, status epilepticus. This patient's care requires constant monitoring of vital signs, hemodynamics, respiratory and cardiac monitoring, review of multiple databases, neurological assessment, discussion with family, other specialists and medical decision making of high complexity. I spent 30 minutes of neurocritical care time in the care of  this patient.  I discussed with Dr. Tacy Learn CCM. ? ? ?  ? ?To contact Stroke Continuity provider, please refer to [email or web address] ?After hours, contact General Neurology  ?

## 2022-01-28 NOTE — Progress Notes (Signed)
? ?NAME:  Cindy Brooks, MRN:  [id number], DOB:  04-Apr-1971, LOS: 8 ?ADMISSION DATE:  01/20/2022, CONSULTATION DATE: 01/20/2022 ?REFERRING MD: [facility], CHIEF COMPLAINT: Altered mental status ? ?History of Present Illness:  ?51 year old female who was found outside in her car by family on 4/8 and was moved to the house and left for 5 hours at which time she did not move. Question if she had seizures therefore emergency medical services was activated.  She is transferred to [facility] emergency department near obtunded has extremely loud respirations copious secretions and difficulty controlling her airway.  She has been seen by neurology.  She admitted to the intensive care unit for further evaluation and treatment. ? ?Pertinent  Medical History  ?HTN  ?TIA ? 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 Trach placed.  ?Interim History / Subjective:  ? ?Nothing overnight. Awaiting bedside trach this am  ? ?Objective   ?Blood pressure (Abnormal) 150/111, pulse 100, temperature 100 ?F (37.8 ?C), temperature source Axillary, resp. rate 18, height 5\' 4"  (1.626 m), weight 77.6 kg, SpO2 97 %. ?   ?Vent Mode: PRVC ?FiO2 (%):  [40 %] 40 % ?Set Rate:  [18 bmp] 18 bmp ?Vt Set:  [440 mL] 440 mL ?PEEP:  [5 cmH20] 5 cmH20 ?Pressure Support:  [12 cmH20] 12 cmH20 ?Plateau Pressure:  [15 cmH20-18 cmH20] 16 cmH20  ? ?Intake/Output Summary (Last 24 hours) at 01/28/2022 0728 ?Last data filed at 01/28/2022 0700 ?Gross per 24 hour  ?Intake 150.03 ml  ?Output 1850 ml  ?Net -1699.97 ml  ? ?Filed Weights  ? 01/26/22 0435 01/27/22 0459 01/28/22 0332  ?Weight: 81.7 kg 81.8 kg 77.6 kg  ? ? ?Examination: ?  ?General unresponsive on vent  ?HENT 6 trach mid line. No drainage PERRL ?Pulm clear w/ scattered rhonchi after  cough  ?Card RRR ?Abd soft ?Ext warm and dry  ?Neuro + cough. Decorticate posturing otherwise no response  ?Gu cl yellow  ? ?Resolved Hospital Problem list   ?Urinary tract infection (completed treatment) ?Seizure  ? ?Assessment & Plan:  ?Principal Problem: ?  Thalamic stroke ([place]) ?Active Problems: ?  Acute encephalopathy ?  Electrolyte and fluid disorder ?  Respiratory failure ([place]) ?  Tracheostomy dependence ([place]) ?  Hypertension ?  AKI (acute kidney injury) ([place]) ? ? ?Acute midbrain/bilateral thalamic stroke c/b acute metabolic encephalopathy w/ possible Wernicke's given h/o ETOH abuse & reported seizure  ?-last EEG neg  ?Plan ?Cont supportive care w/ serial neuro checks as directed by stroke team ?Provigil per stroke team ?Cont secondary stroke prevention w/ Plavix and statin. (Note she is allergic to ASA) ?Cont thiamine and folate  ?MRI 2 weeks  ?RASS goal 0  ? ?Acute Hypoxemic Respiratory Failure; s/p trach 4/16 ?Plan ?Cont daily SBT and assessment for ATC ?VAP bundle  ? ? ?Hypertension ?Plan ?Amlodipine  ? ?Mild AKI ?Plan ?Free water adjusted  ?Am chem  ? ?Fluid and electrolyte imbalance: hypernatremia, hyperchloremia ?-water deficit calculates to: 2.5 liters ?Plan ?Add D5w as awaiting PEG ?Am chem  ?Best Practice (right click and "Reselect all SmartList Selections" daily)  ?Diet/type: NPO with tube feeds ?DVT prophylaxis: LMWH ?GI prophylaxis: PPI ?Lines: N/A ?Foley:  N/A ?Code Status:  full code ?Last date of multidisciplinary goals of care discussion: daughters (2 in person) updated at  bedside 4/14, please see plan of care note ?Needs SNF  ?PEG pending today  ? ?Critical care time: 32 min   ?Erick Colace ACNP-BC ?[place] ?Pager # [phone] OR # [phone] if no answer ? ? ? ? ? ? ?

## 2022-01-28 NOTE — Progress Notes (Signed)
Tachycardic, starting to spike temp. ?Hypertensive, ext warm. ?Just got PEG tube.  Abdomen benign. ?Check CBC, lactate. ?Give tylenol and another pain med bolus. ? ?Myrla Halsted MD PCCM ?

## 2022-01-28 NOTE — Progress Notes (Signed)
eLink Physician-Brief Progress Note ?Patient Name: Cindy Brooks ?DOB: May 23, 1971 ?MRN: [id number] ? ? ?Date of Service ? 01/28/2022  ?HPI/Events of Note ? Multiple issues: 1. Sinus Tachycardia - HR = 124. BP = 149/90. 2. Lactic Acid = 3.7. Hgb = 14.6.  ?eICU Interventions ? Plan: ?Bolus with LR 1 liter IV over 1 hour now.   ? ? ? ?Intervention Category ?Major Interventions: Arrhythmia - evaluation and management ? ?Fiana Gladu Cornelia Copa ?01/28/2022, 10:09 PM ?

## 2022-01-29 ENCOUNTER — Inpatient Hospital Stay (HOSPITAL_COMMUNITY): Payer: No Typology Code available for payment source

## 2022-01-29 ENCOUNTER — Encounter (HOSPITAL_COMMUNITY): Payer: Self-pay | Admitting: Surgery

## 2022-01-29 DIAGNOSIS — R6510 Systemic inflammatory response syndrome (SIRS) of non-infectious origin without acute organ dysfunction: Secondary | ICD-10-CM

## 2022-01-29 DIAGNOSIS — J9690 Respiratory failure, unspecified, unspecified whether with hypoxia or hypercapnia: Secondary | ICD-10-CM

## 2022-01-29 DIAGNOSIS — Z931 Gastrostomy status: Secondary | ICD-10-CM

## 2022-01-29 DIAGNOSIS — E872 Acidosis, unspecified: Secondary | ICD-10-CM

## 2022-01-29 LAB — COMPREHENSIVE METABOLIC PANEL
ALT: 40 U/L (ref 0–44)
ALT: 36 U/L (ref 0–44)
AST: 30 U/L (ref 15–41)
AST: 29 U/L (ref 15–41)
Albumin: 3.9 g/dL (ref 3.5–5.0)
Albumin: 3.3 g/dL — ABNORMAL LOW (ref 3.5–5.0)
Alkaline Phosphatase: 71 U/L (ref 38–126)
Alkaline Phosphatase: 63 U/L (ref 38–126)
Anion gap: 12 (ref 5–15)
Anion gap: 8 (ref 5–15)
BUN: 33 mg/dL — ABNORMAL HIGH (ref 6–20)
BUN: 27 mg/dL — ABNORMAL HIGH (ref 6–20)
CO2: 26 mmol/L (ref 22–32)
CO2: 27 mmol/L (ref 22–32)
Calcium: 9.8 mg/dL (ref 8.9–10.3)
Calcium: 9.5 mg/dL (ref 8.9–10.3)
Chloride: 109 mmol/L (ref 98–111)
Chloride: 114 mmol/L — ABNORMAL HIGH (ref 98–111)
Creatinine, Ser: 1.23 mg/dL — ABNORMAL HIGH (ref 0.44–1.00)
Creatinine, Ser: 1.03 mg/dL — ABNORMAL HIGH (ref 0.44–1.00)
GFR, Estimated: 54 mL/min — ABNORMAL LOW (ref >60)
GFR, Estimated: >60 mL/min (ref >60)
Glucose, Bld: 161 mg/dL — ABNORMAL HIGH (ref 70–99)
Glucose, Bld: 177 mg/dL — ABNORMAL HIGH (ref 70–99)
Potassium: 4.8 mmol/L (ref 3.5–5.1)
Potassium: 4.5 mmol/L (ref 3.5–5.1)
Sodium: 147 mmol/L — ABNORMAL HIGH (ref 135–145)
Sodium: 149 mmol/L — ABNORMAL HIGH (ref 135–145)
Total Bilirubin: 0.5 mg/dL (ref 0.3–1.2)
Total Bilirubin: 0.4 mg/dL (ref 0.3–1.2)
Total Protein: 8.9 g/dL — ABNORMAL HIGH (ref 6.5–8.1)
Total Protein: 7.7 g/dL (ref 6.5–8.1)

## 2022-01-29 LAB — CBC
HCT: 39.1 % (ref 36.0–46.0)
Hemoglobin: 12.4 g/dL (ref 12.0–15.0)
MCH: 26.6 pg (ref 26.0–34.0)
MCHC: 31.7 g/dL (ref 30.0–36.0)
MCV: 83.9 fL (ref 80.0–100.0)
Platelets: 326 10*3/uL (ref 150–400)
RBC: 4.66 MIL/uL (ref 3.87–5.11)
RDW: 15.4 % (ref 11.5–15.5)
WBC: 13.5 10*3/uL — ABNORMAL HIGH (ref 4.0–10.5)
nRBC: 0 % (ref 0.0–0.2)

## 2022-01-29 LAB — LACTIC ACID, PLASMA
Lactic Acid, Venous: 1.9 mmol/L (ref 0.5–1.9)
Lactic Acid, Venous: 1.5 mmol/L (ref 0.5–1.9)

## 2022-01-29 LAB — GLUCOSE, CAPILLARY
Glucose-Capillary: 147 mg/dL — ABNORMAL HIGH (ref 70–99)
Glucose-Capillary: 167 mg/dL — ABNORMAL HIGH (ref 70–99)
Glucose-Capillary: 150 mg/dL — ABNORMAL HIGH (ref 70–99)
Glucose-Capillary: 121 mg/dL — ABNORMAL HIGH (ref 70–99)
Glucose-Capillary: 160 mg/dL — ABNORMAL HIGH (ref 70–99)
Glucose-Capillary: 164 mg/dL — ABNORMAL HIGH (ref 70–99)

## 2022-01-29 LAB — PROCALCITONIN: Procalcitonin: <0.1 ng/mL

## 2022-01-29 IMAGING — DX DG CHEST 1V PORT
1 series · 1 of 1 positions shown · non-contrast
Comparison: Previous studies including the examination of
[DATE]

CLINICAL DATA: Altered mental status

EXAM:
PORTABLE CHEST 1 VIEW

[chest]
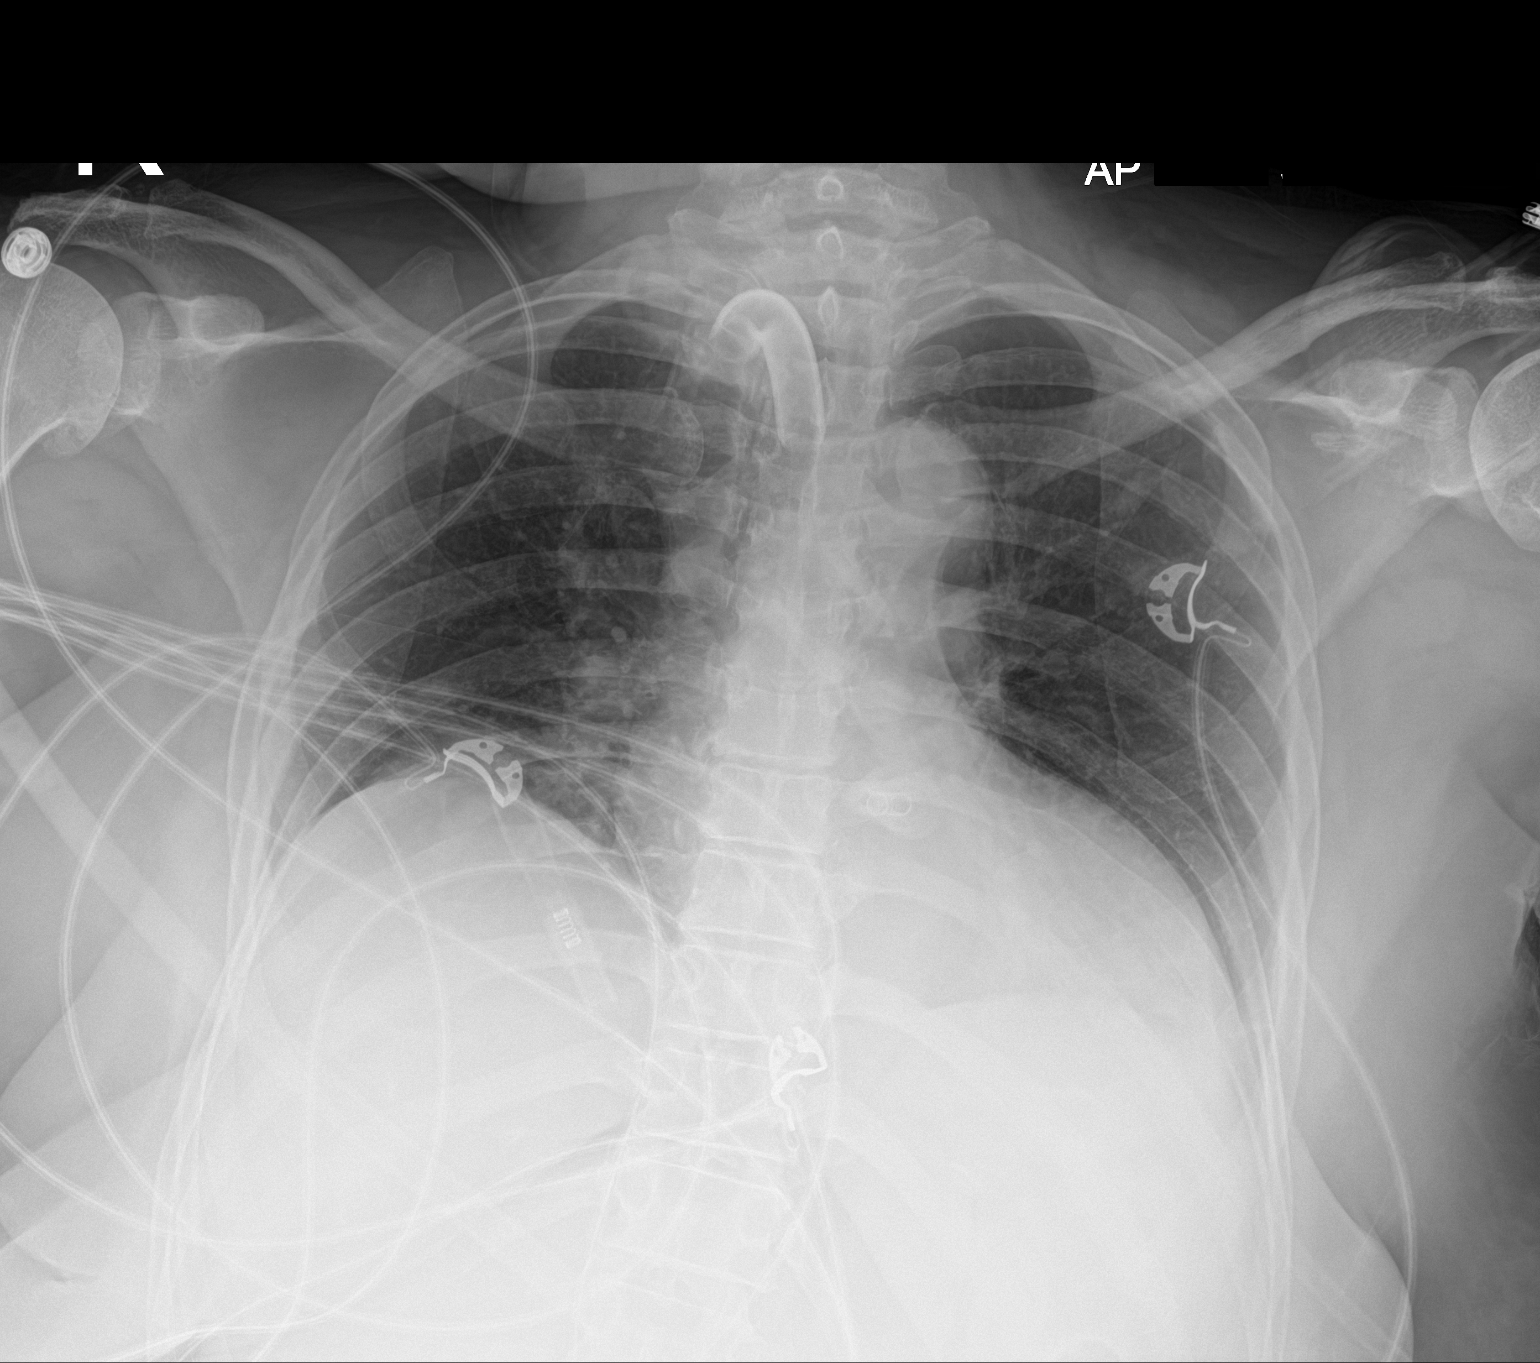

[1 of 1 positions shown; findings below may reference images not displayed]

FINDINGS: Transverse diameter of heart is increased. There is improvement in
aeration of medial left lower lung fields. Small linear densities
seen in the right lower lung fields. There are no signs of pulmonary
edema. There is blunting of left lateral CP angle. Tip of
tracheostomy is 4 cm above the carina. Degenerative changes are
noted in the right shoulder.
IMPRESSION: There is improvement in aeration of left lower lung fields
suggesting resolving atelectasis/pneumonia in the left lower lobe.
Small linear densities in the right lower lung fields suggest
subsegmental atelectasis. No new focal pulmonary consolidation is
seen. Minimal left pleural effusion.

## 2022-01-29 MED ORDER — CHLORDIAZEPOXIDE HCL 25 MG PO CAPS
25.0000 mg | ORAL_CAPSULE | Freq: Four times a day (QID) | ORAL | Status: AC
  Administered 2022-01-29 (×3): 25 mg
  Filled 2022-01-29 (×3): qty 1

## 2022-01-29 MED ORDER — METOPROLOL TARTRATE 25 MG/10 ML ORAL SUSPENSION
50.0000 mg | Freq: Two times a day (BID) | ORAL | Status: DC
  Administered 2022-01-29 – 2022-02-07 (×19): 50 mg
  Filled 2022-01-29 (×19): qty 20

## 2022-01-29 MED ORDER — CHLORDIAZEPOXIDE HCL 25 MG PO CAPS
50.0000 mg | ORAL_CAPSULE | Freq: Once | ORAL | Status: AC
  Administered 2022-01-29: 50 mg
  Filled 2022-01-29: qty 2

## 2022-01-29 MED ORDER — LOPERAMIDE HCL 2 MG PO CAPS: 2.0000 mg | ORAL_CAPSULE | ORAL | Status: DC | PRN

## 2022-01-29 MED ORDER — LOPERAMIDE HCL 1 MG/7.5ML PO SUSP
2.0000 mg | ORAL | Status: DC | PRN
  Administered 2022-02-01: 4 mg
  Filled 2022-01-29: qty 30

## 2022-01-29 MED ORDER — CHLORDIAZEPOXIDE HCL 25 MG PO CAPS
25.0000 mg | ORAL_CAPSULE | Freq: Three times a day (TID) | ORAL | Status: AC
  Administered 2022-01-30 (×3): 25 mg
  Filled 2022-01-29 (×3): qty 1

## 2022-01-29 MED ORDER — CHLORDIAZEPOXIDE HCL 25 MG PO CAPS
25.0000 mg | ORAL_CAPSULE | ORAL | Status: DC
  Administered 2022-01-31: 25 mg
  Filled 2022-01-29: qty 1

## 2022-01-29 MED ORDER — CHLORDIAZEPOXIDE HCL 25 MG PO CAPS: 25.0000 mg | ORAL_CAPSULE | Freq: Every day | ORAL | Status: DC

## 2022-01-29 NOTE — Progress Notes (Signed)
Patient ID: Cindy Brooks, female   DOB: December 29, 1970, 51 y.o.   MRN: [id number] ?ABD soft, PEG site intact. ?Continue binder loosely to protect PEG. ? ?Violeta Gelinas, MD, MPH, FACS ?Please use [email or web address] to contact on call provider ? ?

## 2022-01-29 NOTE — Procedures (Signed)
Patient Name: Cindy Brooks  ?MRN: [id number]  ?Epilepsy Attending: Charlsie Quest  ?Referring Physician/Provider: Simonne Martinet, NP ?Date: 01/29/2022 ?Duration: 22.41 mins ? ?Patient history: 51 year old female with altered mental status.  EEG done for seizure. ? ?Level of alertness: lethargic  ? ?AEDs during EEG study: LEV ? ?Technical aspects: This EEG study was done with scalp electrodes positioned according to the 10-20 International system of electrode placement. Electrical activity was acquired at a sampling rate of 500Hz  and reviewed with a high frequency filter of 70Hz  and a low frequency filter of 1Hz . EEG data were recorded continuously and digitally stored.  ? ?Description: EEG showed continuous generalized predominantly 6 to 8 Hz theta-alpha activity admixed with intermittent generalized 2 to 3 Hz delta slowing. Hyperventilation and photic stimulation were not performed.    ? ?ABNORMALITY ?- Continuous slow, generalized ? ?IMPRESSION: ?This study is suggestive of moderate diffuse encephalopathy, nonspecific etiology. No seizures or epileptiform discharges were seen throughout the recording. ? ?  ? ?

## 2022-01-29 NOTE — Progress Notes (Addendum)
STROKE TEAM PROGRESS NOTE  ? ?INTERVAL HISTORY ?4/16 Trach placed ?4/17 PEG placed  ?Tachypnic, tachycardic, hypertensive yesterday at 6pm and had to go back on the vent on full support.  ?PCCM for medical and vent management  ?Spot EEG ordered due to AMS initial EEG was done under sedation and CXR for the morning for fever overnight.  ?.  ?Vitals:  ? 01/29/22 0600 01/29/22 0700 01/29/22 0742 01/29/22 0800  ?BP:  (!) 162/106 (!) 162/106 (!) 156/100  ?Pulse: (!) 124 (!) 125 (!) 127 (!) 126  ?Resp: (!) 22 (!) 28 (!) 28 18  ?Temp:      ?TempSrc:      ?SpO2: 99% 98% 98% 97%  ?Weight:      ?Height:      ? ?CBC:  ?Recent Labs  ?Lab 01/23/22 ?[id number] 01/24/22 ?0908 01/28/22 ?1926 01/29/22 ?0232  ?WBC 7.1   < > 17.5* 13.5*  ?NEUTROABS 4.7  --   --   --   ?HGB 11.4*   < > 14.6 12.4  ?HCT 35.2*   < > 45.1 39.1  ?MCV 83.0   < > 84.1 83.9  ?PLT 202   < > 355 326  ? < > = values in this interval not displayed.  ? ? ?Basic Metabolic Panel:  ?Recent Labs  ?Lab 01/28/22 ?0306 01/28/22 ?1926  ?NA 149* 147*  ?K 4.9 4.8  ?CL 112* 109  ?CO2 30 26  ?GLUCOSE 106* 161*  ?BUN 28* 33*  ?CREATININE 1.12* 1.23*  ?CALCIUM 9.8 9.8  ? ? ?Lipid Panel:  ?No results for input(s): CHOL, TRIG, HDL, CHOLHDL, VLDL, LDLCALC in the last 168 hours. ? ?HgbA1c:  ?No results for input(s): HGBA1C in the last 168 hours. ? ?Urine Drug Screen:  ?No results for input(s): LABOPIA, COCAINSCRNUR, LABBENZ, AMPHETMU, THCU, LABBARB in the last 168 hours. ?  ?Alcohol Level  ?No results for input(s): ETH in the last 168 hours. ? ? ?IMAGING past 24 hours ?VAS [place] LOWER EXTREMITY VENOUS (DVT) ? ?Result Date: 01/28/2022 ? Lower Venous DVT Study Patient Name:  Cindy Brooks  Date of Exam:   01/28/2022 Medical Rec #: [id number]      Accession #:    [id number] Date of Birth: 1971/09/17     Patient Gender: F Patient Age:   51 years Exam Location:  [facility] Procedure:      VAS [place] LOWER EXTREMITY VENOUS (DVT) Referring Phys: Cornelius Moras XU  --------------------------------------------------------------------------------  Indications: Stroke.  Limitations: Poor ultrasound/tissue interface. Comparison Study: No prior study Performing Technologist: Maudry Mayhew MHA, RDMS, RVT, RDCS  Examination Guidelines: A complete evaluation includes B-mode imaging, spectral Doppler, color Doppler, and power Doppler as needed of all accessible portions of each vessel. Bilateral testing is considered an integral part of a complete examination. Limited examinations for reoccurring indications may be performed as noted. The reflux portion of the exam is performed with the patient in reverse Trendelenburg.  +---------+---------------+---------+-----------+----------+--------------+ RIGHT    CompressibilityPhasicitySpontaneityPropertiesThrombus Aging +---------+---------------+---------+-----------+----------+--------------+ CFV      Full           Yes      Yes                                 +---------+---------------+---------+-----------+----------+--------------+ SFJ      Full                                                        +---------+---------------+---------+-----------+----------+--------------+  FV Prox  Full                                                        +---------+---------------+---------+-----------+----------+--------------+ FV Mid   Full                                                        +---------+---------------+---------+-----------+----------+--------------+ FV DistalFull                                                        +---------+---------------+---------+-----------+----------+--------------+ PFV      Full                                                        +---------+---------------+---------+-----------+----------+--------------+ POP      Full           Yes      Yes                                  +---------+---------------+---------+-----------+----------+--------------+ PTV      Full                                                        +---------+---------------+---------+-----------+----------+--------------+   Right Technical Findings: Not visualized segments include peroneal veins.  +---------+---------------+---------+-----------+----------+--------------+ LEFT     CompressibilityPhasicitySpontaneityPropertiesThrombus Aging +---------+---------------+---------+-----------+----------+--------------+ CFV      Full           Yes      Yes                                 +---------+---------------+---------+-----------+----------+--------------+ SFJ      Full                                                        +---------+---------------+---------+-----------+----------+--------------+ FV Prox  Full                                                        +---------+---------------+---------+-----------+----------+--------------+ FV Mid   Full                                                        +---------+---------------+---------+-----------+----------+--------------+  FV DistalFull                                                        +---------+---------------+---------+-----------+----------+--------------+ PFV      Full                                                        +---------+---------------+---------+-----------+----------+--------------+ POP      Full           Yes      Yes                                 +---------+---------------+---------+-----------+----------+--------------+ PTV      Full                                                        +---------+---------------+---------+-----------+----------+--------------+ PERO     Full                                                        +---------+---------------+---------+-----------+----------+--------------+     Summary: RIGHT: - There is no evidence of  deep vein thrombosis in the lower extremity. However, portions of this examination were limited- see technologist comments above.  - No cystic structure found in the popliteal fossa.  LEFT: - There is no evidence of deep vein thrombosis in the lower extremity.  - No cystic structure found in the popliteal fossa.  *See table(s) above for measurements and observations. Electronically signed by Servando Snare MD on 01/28/2022 at 5:38:07 PM.    Final    ? ?PHYSICAL EXAM ?General:  Intubated, obese middle-aged African-American patient in no acute distress ?Respiratory:  Respirations synchronous with ventilator, s/p trach ? Afebrile. Head is nontraumatic. Neck is supple. S/p PEG ?Neurological Exam :  ?No sedation.  Eyes are closed.  She does not respond to tactile or verbal stimuli but with sternal rub she gets slightly agitated . ?Pupils 84mm bilaterally and nonreactive, corneal reflexes intact, oculocephalic reflex absent, cough and gag reflexes present.  Not follow commands.  No spontaneous extremity movements noted.  Plantars both mute. With painful stimuli, slight withdraw BUEs and BLEs.  ? ?ASSESSMENT/PLAN ?[name] is a 51 y.o. female with history of HTN, TIA, ETOH abuse and medication noncompliance presenting after she was found by her family to be unresponsive in her car on 4/8.  She was moved to her couch, and when her family checked on her again on 4/9, she had not moved and EMS was called.  She was brought to the hospital and intubated for inability to protect her airways.  Workup for acute encephalopathy was started.  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  Multiple punctate  acute ischemic infarcts were also seen in bilateral cerebral hemispheres.  CTA head and neck performed this morning shows severe bilateral P1 and right P2 stenoses as well as proximal left M2 stenosis and severe right ACA  stenosis. ? ?Stroke: artery of Percheron infarcts and b/l hemisphere punctate embolic strokes.  Less likely Wernicke's encephalopathy. Source unclear ?CT head No acute abnormality.  ?CTA head & neck severe bilateral P1 and right P2 stenoses as well as proximal left M2 stenosis and severe right ACA stenosis, 40% stenosis of left ICA ?CTV no dural venous sinus thrombosis ?MRI  Patchy restricted diffusion in bilateral thalami and midbrain with multiple punctate

## 2022-01-29 NOTE — Progress Notes (Signed)
? ?NAME:  Cindy Brooks, MRN:  [id number], DOB:  Feb 19, 1971, LOS: 9 ?ADMISSION DATE:  01/20/2022, CONSULTATION DATE: 01/20/2022 ?REFERRING MD: [facility], CHIEF COMPLAINT: Altered mental status ? ?History of Present Illness:  ?51 year old female who was found outside in her car by family on 4/8 and was moved to the house and left for 5 hours at which time she did not move. Question if she had seizures therefore emergency medical services was activated.  She is transferred to [facility] emergency department near obtunded has extremely loud respirations copious secretions and difficulty controlling her airway.  She has been seen by neurology.  She admitted to the intensive care unit for further evaluation and treatment. ? ?Pertinent  Medical History  ?HTN  ?TIA ? 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 Trach placed.  ?4/18 PEG placed. Spiking fever. Lactate spiked. Got fluid. Cleared.  ?Interim History / Subjective:  ? ? ?Objective   ?Blood pressure (Abnormal) 155/99, pulse (Abnormal) 124, temperature (Abnormal) 100.9 ?F (38.3 ?C), temperature source Axillary, resp. rate (Abnormal) 22, height 5\' 4"  (1.626 m), weight 77.6 kg, SpO2 98 %. ?   ?Vent Mode: PRVC ?FiO2 (%):  [28 %-40 %] 40 % ?Set Rate:  [18 bmp] 18 bmp ?Vt Set:  [440 mL] 440 mL ?PEEP:  [5 cmH20] 5 cmH20 ?Pressure Support:  [8 cmH20] 8 cmH20 ?Plateau Pressure:  [17 cmH20-21 cmH20] 18 cmH20  ? ?Intake/Output Summary (Last 24 hours) at 01/29/2022 0750 ?Last data filed at 01/29/2022 0600 ?Gross per 24 hour  ?Intake 2107.91 ml  ?Output 1650 ml  ?Net 457.91 ml  ? ?Filed Weights  ? 01/26/22 0435 01/27/22 0459 01/28/22 0332  ?Weight: 81.7 kg 81.8 kg 77.6 kg  ? ? ?Examination: ?  ?General: 51 year old female remains comatose on  ventilator ?HEENT normocephalic atraumatic tracheostomy unremarkable ?Pulmonary scattered rhonchi ?Cardiac regular rate and rhythm tachycardic ?Abdomen soft nontender tolerating feeds via PEG ?Extremities warm dry ?Neuro has a cough decorticate posturing ?GU yellow urine ? ?Resolved Hospital Problem list   ?Urinary tract infection (completed treatment) ?Seizure  ? ?Assessment & Plan:  ?Principal Problem: ?  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  Acute encephalopathy ?  Electrolyte and fluid disorder ?  Respiratory failure ([place]) ?  Tracheostomy dependence ([place]) ?  Hypertension ?  AKI (acute kidney injury) ([place]) ?  Lactic acidosis ?  SIRS (systemic inflammatory response syndrome) (HCC) ?  PEG (percutaneous endoscopic gastrostomy) status ([place]) ? ? ?Acute midbrain/bilateral thalamic stroke c/b acute metabolic encephalopathy w/ possible Wernicke's given h/o ETOH abuse & reported seizure  ?-last EEG neg  ?Plan ?Cont supportive care ?PRovigil per stroke team  ?Secondary stroke prevention Plavix and statin (No ASA as allergic) ?Cont thiamine and folate  ?MRI first week of May ? ?Acute Hypoxemic Respiratory Failure; s/p trach 4/16 ?Plan ?VAP bundle ?RASS goal 0 ?ATC as tolerated ? ?Fever/SIRS response. Lactic acidosis. Acutely after PEG>4/17. Resolved w/ IVFs. Wbc actually trending down (lactate cleared). Still tachycardic also a concern would be could she be withdrawing ? Subclinical sz?  ?Plan ?Cont to trend fever and WBC curve ?EEG  ?Adding low dose librium in case w/d  ?CK CXR and PCT ? ?Hypertension ?Plan ?Amlodipine  ?Adding metoprolol  ? ?Mild AKI ?Increased water replacement 4/17, and got volume replacement via isotonic bolus  ?Plan ?  Recheck chem this am  ?I&O ?Renal dose meds ?May need to add LR MIVF ? ?Fluid and electrolyte imbalance: hypernatremia, hyperchloremia ?-Free water adjusted 4/17 Na trending down  ?Plan ?Change free water to VT ?Am chem pending  ? ?Dysphagia s/p CVA. Now has PEG ?Plan ?Start back  tube feeds today  ? ?Best Practice (right click and "Reselect all SmartList Selections" daily)  ?Diet/type: NPO with tube feeds ?DVT prophylaxis: LMWH ?GI prophylaxis: PPI ?Lines: N/A ?Foley:  N/A ?Code Status:  full code ?Last date of multidisciplinary goals of care discussion: daughters (2 in person) updated at bedside 4/14, please see plan of care note ?Needs SNF  ? ? ?Critical care time: 32 min   ?Erick Colace ACNP-BC ?[place] ?Pager # [phone] OR # [phone] if no answer ? ? ? ? ? ? ?

## 2022-01-29 NOTE — Progress Notes (Signed)
SLP Cancellation Note ? ?Patient Details ?Name: Cindy Brooks ?MRN: [id number] ?DOB: Jun 07, 1971 ? ? ?Cancelled treatment:       Reason Eval/Treat Not Completed: Patient not medically ready;Patient's level of consciousness. SLP touched base with RN and reviewed chart. Pt is still requiring mechanical ventilation and is cognitively not appropriate to attempt inline PMV trials. Will continue to check in for readiness. ? ? ? ?Mahala Menghini., M.A. CCC-SLP ?Acute [facility] Services ?Office [phone] ? ?Secure chat preferred ? ?01/29/2022, 10:19 AM ?

## 2022-01-29 NOTE — Progress Notes (Signed)
EEG completed, results pending. 

## 2022-01-30 ENCOUNTER — Inpatient Hospital Stay (HOSPITAL_COMMUNITY): Payer: No Typology Code available for payment source

## 2022-01-30 DIAGNOSIS — E785 Hyperlipidemia, unspecified: Secondary | ICD-10-CM

## 2022-01-30 LAB — COMPREHENSIVE METABOLIC PANEL WITH GFR
ALT: 44 U/L (ref 0–44)
AST: 32 U/L (ref 15–41)
Albumin: 3 g/dL — ABNORMAL LOW (ref 3.5–5.0)
Alkaline Phosphatase: 64 U/L (ref 38–126)
Anion gap: 7 (ref 5–15)
BUN: 23 mg/dL — ABNORMAL HIGH (ref 6–20)
CO2: 29 mmol/L (ref 22–32)
Calcium: 9.3 mg/dL (ref 8.9–10.3)
Chloride: 116 mmol/L — ABNORMAL HIGH (ref 98–111)
Creatinine, Ser: 1.1 mg/dL — ABNORMAL HIGH (ref 0.44–1.00)
GFR, Estimated: >60 mL/min
Glucose, Bld: 164 mg/dL — ABNORMAL HIGH (ref 70–99)
Potassium: 4.3 mmol/L (ref 3.5–5.1)
Sodium: 152 mmol/L — ABNORMAL HIGH (ref 135–145)
Total Bilirubin: 0.5 mg/dL (ref 0.3–1.2)
Total Protein: 7.3 g/dL (ref 6.5–8.1)

## 2022-01-30 LAB — ENA+DNA/DS+ANTICH+CENTRO+JO...
Anti JO-1: <0.2 AI (ref 0.0–0.9)
Centromere Ab Screen: <0.2 AI (ref 0.0–0.9)
Chromatin Ab SerPl-aCnc: <0.2 AI (ref 0.0–0.9)
ENA SM Ab Ser-aCnc: 0.3 AI (ref 0.0–0.9)
Ribonucleic Protein: 1.5 AI — ABNORMAL HIGH (ref 0.0–0.9)
SSA (Ro) (ENA) Antibody, IgG: <0.2 AI (ref 0.0–0.9)
SSB (La) (ENA) Antibody, IgG: <0.2 AI (ref 0.0–0.9)
Scleroderma (Scl-70) (ENA) Antibody, IgG: <0.2 AI (ref 0.0–0.9)
ds DNA Ab: 6 [IU]/mL (ref 0–9)

## 2022-01-30 LAB — DRVVT CONFIRM: dRVVT Confirm: 1.4 ratio — ABNORMAL HIGH (ref 0.8–1.2)

## 2022-01-30 LAB — CBC
HCT: 36.7 % (ref 36.0–46.0)
Hemoglobin: 11.8 g/dL — ABNORMAL LOW (ref 12.0–15.0)
MCH: 27.3 pg (ref 26.0–34.0)
MCHC: 32.2 g/dL (ref 30.0–36.0)
MCV: 85 fL (ref 80.0–100.0)
Platelets: 304 K/uL (ref 150–400)
RBC: 4.32 MIL/uL (ref 3.87–5.11)
RDW: 15.4 % (ref 11.5–15.5)
WBC: 13.8 K/uL — ABNORMAL HIGH (ref 4.0–10.5)
nRBC: 0 % (ref 0.0–0.2)

## 2022-01-30 LAB — GLUCOSE, CAPILLARY
Glucose-Capillary: 139 mg/dL — ABNORMAL HIGH (ref 70–99)
Glucose-Capillary: 159 mg/dL — ABNORMAL HIGH (ref 70–99)
Glucose-Capillary: 171 mg/dL — ABNORMAL HIGH (ref 70–99)
Glucose-Capillary: 101 mg/dL — ABNORMAL HIGH (ref 70–99)
Glucose-Capillary: 150 mg/dL — ABNORMAL HIGH (ref 70–99)
Glucose-Capillary: 209 mg/dL — ABNORMAL HIGH (ref 70–99)

## 2022-01-30 LAB — ANA W/REFLEX IF POSITIVE: Anti Nuclear Antibody (ANA): POSITIVE — AB

## 2022-01-30 LAB — PTT-LA MIX: PTT-LA Mix: 52.2 s — ABNORMAL HIGH (ref 0.0–40.5)

## 2022-01-30 LAB — LUPUS ANTICOAGULANT PANEL
DRVVT: 60.4 s — ABNORMAL HIGH (ref 0.0–47.0)
PTT Lupus Anticoagulant: 59.7 s — ABNORMAL HIGH (ref 0.0–43.5)

## 2022-01-30 LAB — PROCALCITONIN: Procalcitonin: <0.1 ng/mL

## 2022-01-30 LAB — HEXAGONAL PHASE PHOSPHOLIPID: Hexagonal Phase Phospholipid: 6 s (ref 0–11)

## 2022-01-30 LAB — DRVVT MIX: dRVVT Mix: 50.4 s — ABNORMAL HIGH (ref 0.0–40.4)

## 2022-01-30 IMAGING — MR MR HEAD W/O CM
12 of 13 series · 44 of 48 positions shown · non-contrast
Comparison: [DATE] MRI head

CLINICAL DATA: Stroke, follow-up

EXAM:
MRI HEAD WITHOUT CONTRAST
TECHNIQUE: Multiplanar, multiecho pulse sequences of the brain and surrounding
structures were obtained without intravenous contrast.

[Series 5: DWI · axial · 3.0mm · 0.88mm/px · z∈[-72,+73]mm · 9 of 108 slices shown (1 of 4)]
[im 1/108]
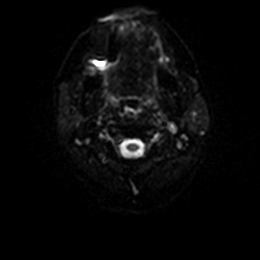
[im 14/108]
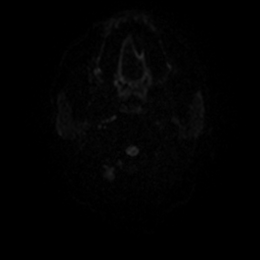
[im 27/108]
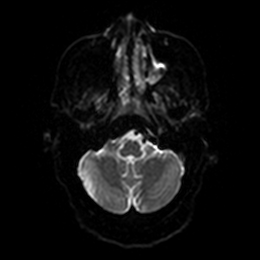
[im 41/108]
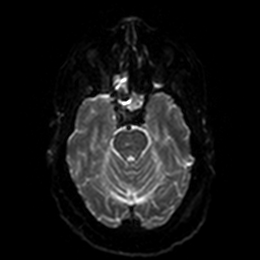
[im 54/108]
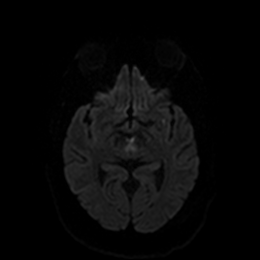
[im 67/108]
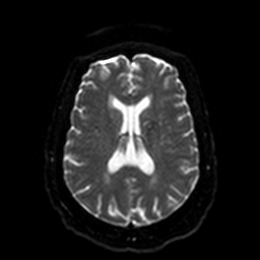
[im 81/108]
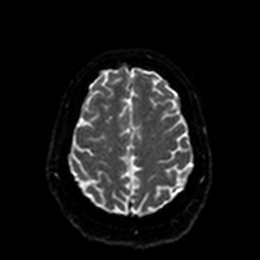
[im 94/108]
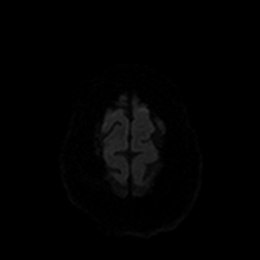
[im 108/108]
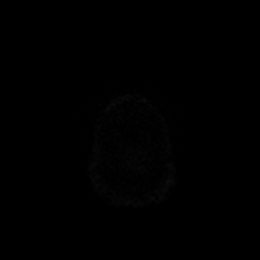

[Series 6: DWI · axial · 3.0mm · 0.88mm/px · z∈[-72,+73]mm · 4 of 54 slices shown (2 of 4)]
[im 1/54]
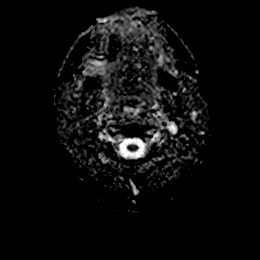
[im 18/54]
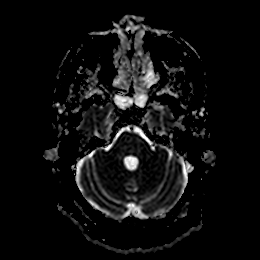
[im 36/54]
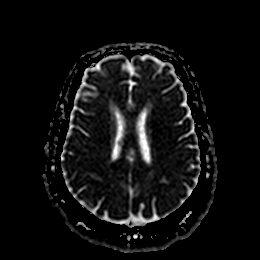
[im 54/54]
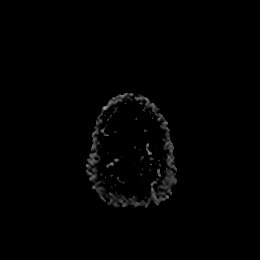

[Series 7: DWI · coronal · 4.0mm · 0.88mm/px · 5 of 68 slices shown (3 of 4)]
[im 1/68]
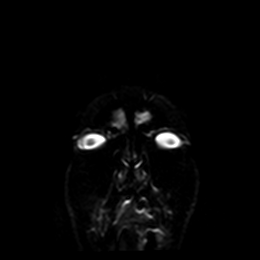
[im 17/68]
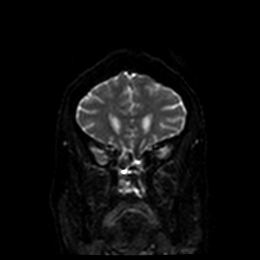
[im 34/68]
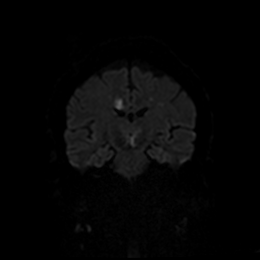
[im 51/68]
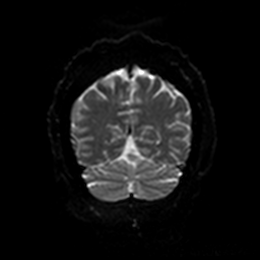
[im 68/68]
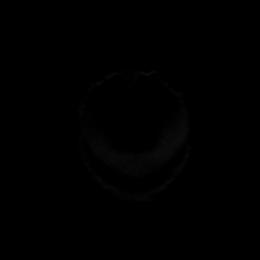

[Series 8: DWI · coronal · 4.0mm · 0.88mm/px · 2 of 34 slices shown (4 of 4)]
[im 1/34]
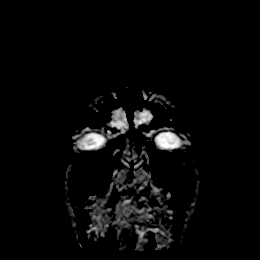
[im 34/34]
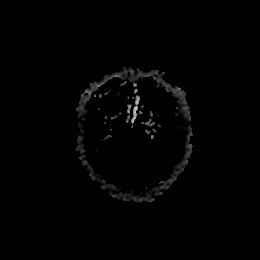

[Series 9: FLAIR · axial · 5.0mm · 0.45mm/px · z∈[-77,+71]mm · 2 of 28 slices shown]
[im 1/28]
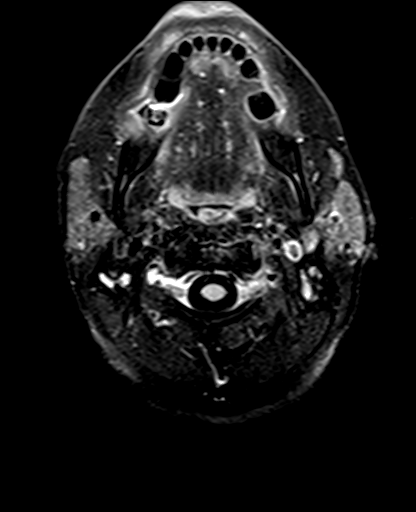
[im 28/28]
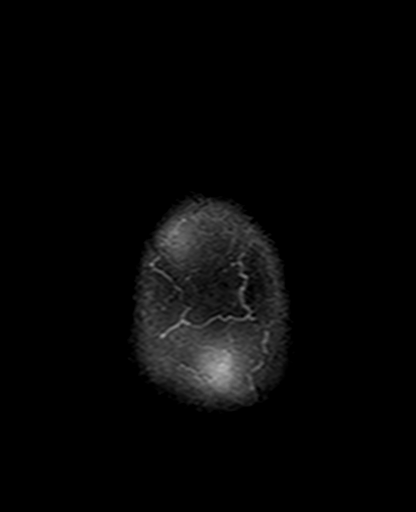

[Series 10: mag_images · axial · 3.0mm · 0.90mm/px · z∈[-83,+77]mm · 4 of 60 slices shown]
[im 1/60]
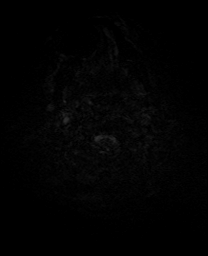
[im 20/60]
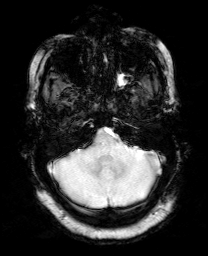
[im 40/60]
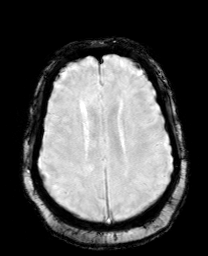
[im 60/60]
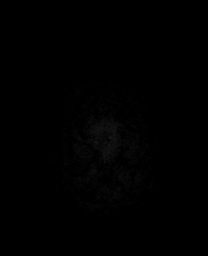

[Series 11: pha_images · axial · 3.0mm · 0.90mm/px · z∈[-81,+77]mm · 4 of 59 slices shown]
[im 1/59]
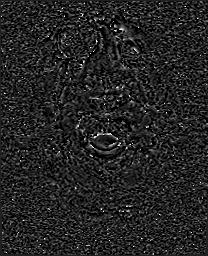
[im 20/59]
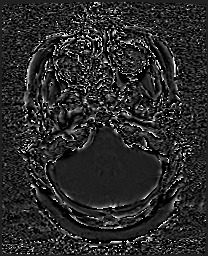
[im 39/59]
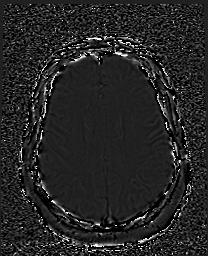
[im 59/59]
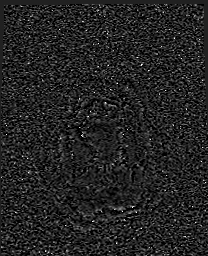

[Series 12: swi_images · axial · 3.0mm · 0.90mm/px · z∈[-83,+77]mm · 4 of 60 slices shown]
[im 1/60]
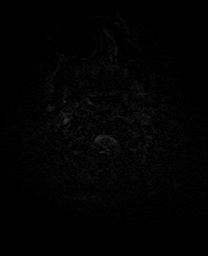
[im 20/60]
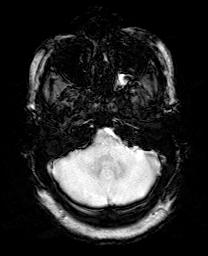
[im 40/60]
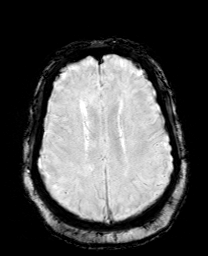
[im 60/60]
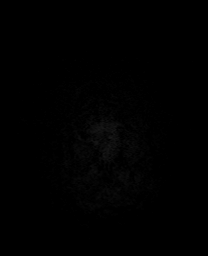

[Series 13: mip_images(sw) · axial · 24.0mm · 0.90mm/px · z∈[-74,+68]mm · 4 of 53 slices shown]
[im 1/53]
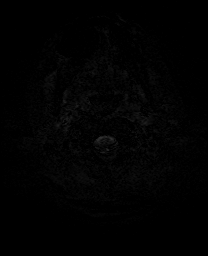
[im 18/53]
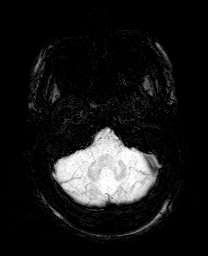
[im 35/53]
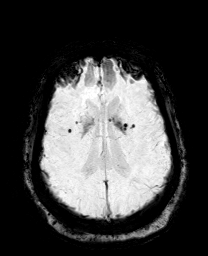
[im 53/53]
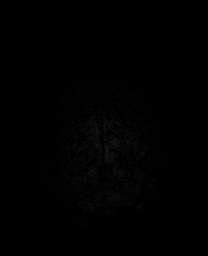

[Series 14: T1 · sagittal · 5.0mm · 0.75mm/px · 2 of 24 slices shown]
[im 1/24]
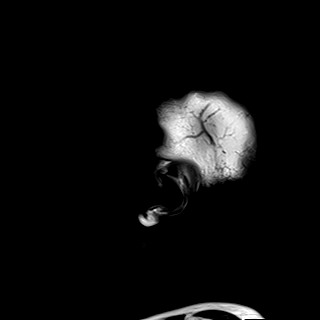
[im 24/24]
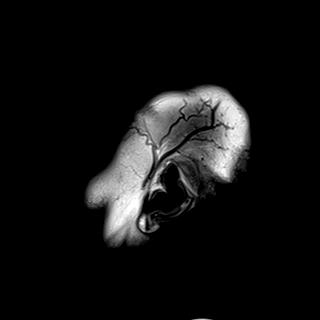

[Series 15: T2 · axial · 5.0mm · 0.72mm/px · z∈[-75,+73]mm · 2 of 28 slices shown (1 of 2)]
[im 1/28]
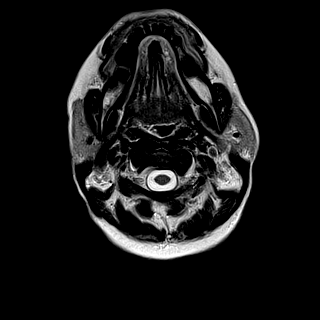
[im 28/28]
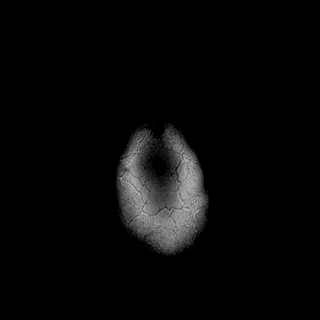

[Series 17: T2 · coronal · 5.0mm · 0.34mm/px · 2 of 29 slices shown (2 of 2)]
[im 1/29]
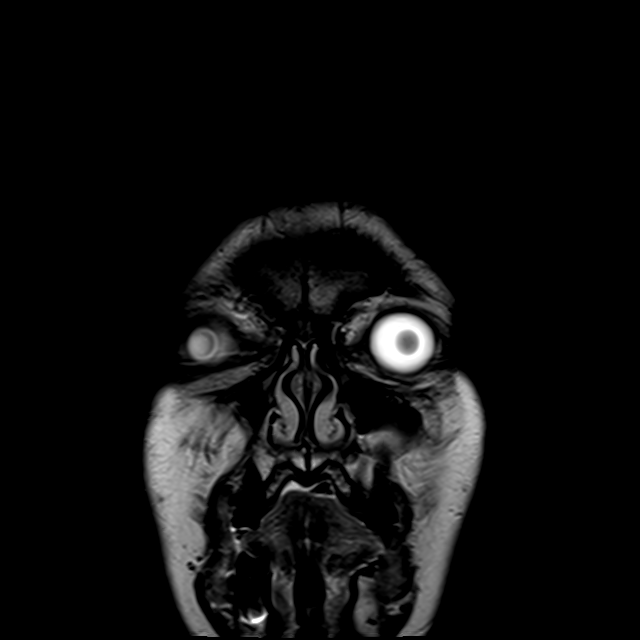
[im 29/29]
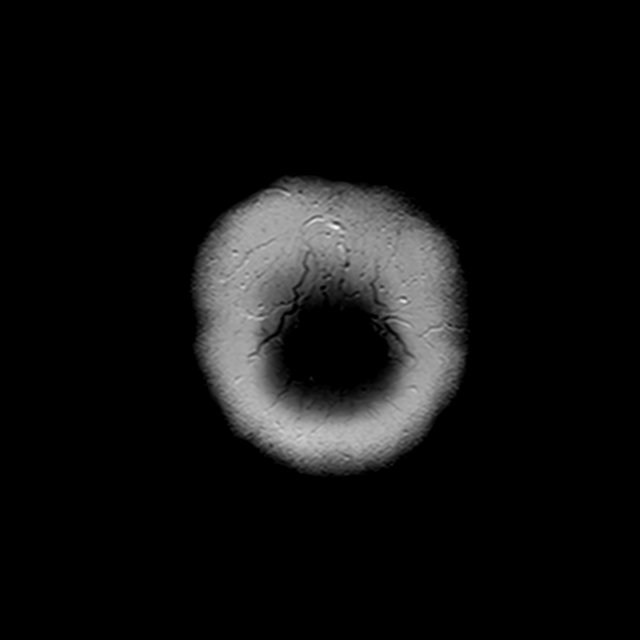

[44 of 48 positions shown; findings below may reference images not displayed]

FINDINGS: Brain: Scattered foci of restricted diffusion with ADC correlates in
the bilateral frontal lobes medially (series 5, image [id number]) which
were not present to this extent on the prior exam. Additional focal
areas of restricted diffusion in the periventricular right parietal
lobe (series 5, image 90), left parietal and frontal lobe (series 5,
image 89), and left medial temporal lobe (series 5, image 8), which
were not present on the prior exam. Decreased intensity of patchy
restricted diffusion in the medial thalami, bilaterally and
symmetrically, and the midbrain, consistent with expected evolution
of previously noted infarct.

No acute hemorrhage, mass, mass effect, or midline shift. No
hydrocephalus or extra-axial collection. Lacunar infarct in the left
basal ganglia and genu of the corpus callosum. Redemonstrated foci
of hemosiderin deposition, likely hypertensive microhemorrhages; no
new foci.

Vascular: Normal flow voids.

Skull and upper cervical spine: Normal marrow signal.

Sinuses/Orbits: Mucosal thickening in the maxillary sinuses and
sphenoid sinuses, with some bubbly appearing fluid in the posterior
right ethmoid air cells and left maxillary sinus.

Other: Trace fluid in the mastoid air cells.
IMPRESSION: 1. Multiple foci of restricted diffusion in the bilateral frontal
lobes and parietal lobes primarily, some of which may represent
watershed infarcts, although additional more isolated suggest an
embolic etiology.
2. Decreased conspicuity of restricted diffusion associated with the
previously noted artery of [place] infarct, involving both thalami
midbrain.

These results will be called to the ordering clinician or
representative by the Radiologist Assistant, and communication
documented in the PACS or [REDACTED].

## 2022-01-30 MED ORDER — SODIUM CHLORIDE 0.9 % IV SOLN: INTRAVENOUS | Status: DC | PRN

## 2022-01-30 MED ORDER — FREE WATER: 200.0000 mL | Freq: Four times a day (QID) | Status: DC

## 2022-01-30 MED ORDER — FREE WATER
200.0000 mL | Freq: Four times a day (QID) | Status: DC
  Administered 2022-01-30 – 2022-01-31 (×4): 200 mL

## 2022-01-30 NOTE — Progress Notes (Addendum)
STROKE TEAM PROGRESS NOTE  ? ?INTERVAL HISTORY ?Patient has been hemodynamically stable but febrile overnight.  Her exam has changed this morning, and she is barely responsive to noxious stimuli.  Limited brain MRI ordered. ?.  ?Vitals:  ? 01/30/22 1015 01/30/22 1100 01/30/22 1200 01/30/22 1300  ?BP:  139/90 (!) 124/92 127/82  ?Pulse:  (!) 102 (!) 102 (!) 106  ?Resp:  19 (!) 23 (!) 25  ?Temp:   100 ?F (37.8 ?C)   ?TempSrc:   Axillary   ?SpO2: 99% 98% 98% 98%  ?Weight:      ?Height:      ? ?CBC:  ?Recent Labs  ?Lab 01/29/22 ?0232 01/30/22 ?0418  ?WBC 13.5* 13.8*  ?HGB 12.4 11.8*  ?HCT 39.1 36.7  ?MCV 83.9 85.0  ?PLT 326 304  ? ? ?Basic Metabolic Panel:  ?Recent Labs  ?Lab 01/29/22 ?9211 01/30/22 ?0418  ?NA 149* 152*  ?K 4.5 4.3  ?CL 114* 116*  ?CO2 27 29  ?GLUCOSE 177* 164*  ?BUN 27* 23*  ?CREATININE 1.03* 1.10*  ?CALCIUM 9.5 9.3  ? ? ?Lipid Panel:  ?No results for input(s): CHOL, TRIG, HDL, CHOLHDL, VLDL, LDLCALC in the last 168 hours. ? ?HgbA1c:  ?No results for input(s): HGBA1C in the last 168 hours. ? ?Urine Drug Screen:  ?No results for input(s): LABOPIA, COCAINSCRNUR, LABBENZ, AMPHETMU, THCU, LABBARB in the last 168 hours. ?  ?Alcohol Level  ?No results for input(s): ETH in the last 168 hours. ? ? ?IMAGING past 24 hours ?No results found. ? ?PHYSICAL EXAM ?General:  Intubated, obese middle-aged African-American patient in no acute distress ?Respiratory:  Respirations synchronous with ventilator, s/p trach ?Head is nontraumatic. Neck is supple. S/p PEG ?Neurological Exam :  ?No sedation.  Eyes are closed.  She does not respond to sternal rub and will flicker her RUE minimally to noxious stimuli. ?Pupils 2mm bilaterally and nonreactive, corneal reflexes intact, oculocephalic reflex weak, cough and gag reflexes present.  Not following commands.  No spontaneous extremity movements noted.  Plantars both mute.  ? ?ASSESSMENT/PLAN ?Ms. Glennie Bose is a 51 y.o. female with history of HTN, TIA, ETOH abuse and  medication noncompliance presenting after she was found by her family to be unresponsive in her car on 4/8.  She was moved to her couch, and when her family checked on her again on 4/9, she had not moved and EMS was called.  She was brought to the hospital and intubated for inability to protect her airways.  Workup for acute encephalopathy was started.  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  Multiple punctate acute ischemic infarcts were also seen in bilateral cerebral hemispheres.  CTA head and neck performed this morning shows severe bilateral P1 and right P2 stenoses as well as proximal left M2 stenosis and severe right ACA stenosis. ? ?Stroke: artery of Percheron infarcts and b/l hemisphere punctate embolic strokes.  Less likely Wernicke's encephalopathy. Source unclear ?CT head No acute abnormality.  ?CTA head & neck severe bilateral P1 and right P2 stenoses as well as proximal left M2 stenosis and severe right ACA stenosis, 40% stenosis of left ICA ?CTV no dural venous sinus thrombosis ?MRI  Patchy restricted diffusion in bilateral thalami and midbrain with multiple punctate acute ischemic infarcts in bilateral cerebral hemispheres and multiple chronic microhemorrhages ?2D Echo EF 55 to 60%.  Left atrium mildly dilated. ?LE venous doppler no evidence of DVT  ?May consider TEE  if neuro has significant improvement ?Hypercoagulable work up pending ?LDL 132 ?HgbA1c 5.7 ?ANA positive - titre pending ?Ribonucleic protein Ab - mildly elevated ?VTE prophylaxis - lovenox ?No antithrombotic prior to admission, now on clopidogrel 75 mg daily. (Aspirin allergy) ?Therapy recommendations:  pending ?Disposition:  pending ? ??? Wernicke's encephalopathy ?Patient has a history of heavy alcohol use ?MRI shows DWI changes in bilateral thalami and midbrain  ( versus changes from severe wernicke`s encephalopathy less likely ) ?Thiamine  supplement ?On phenobarbital for alcohol withdraw  ? ?Respiratory failure ?Patient was intubated for inability to protect airway ?Ventilator management per CCM ?Difficulty being off ventilation ?S/p Trach 4/16, still on vent ? ?Hypertension ?Home meds:  none ?Stable ?Long-term BP goal normotensive ? ?Hyperlipidemia ?Home meds:  none ?LDL 132, goal < 70 ?Rosuvastatin 40 mg daily ?Continue statin on discharge ? ?Other Stroke Risk Factors ?Obesity, Body mass index is 29.25 kg/m?., BMI >/= 30 associated with increased stroke risk, recommend weight loss, diet and exercise as appropriate  ?Hx TIA ? ?Other Active Problems ?Suspected seizures ?Question of seizures prior arrival ?LTM EEG revealed no seizure ?On keppra 1000mg  PO ? ?Risk for ETOH withdrawal ?Patient has history of heavy alcohol use ?ETOH level <10 on admission ?Phenobarbital taper per CCM ? ?Dysphagia ?On tube feeding ?S/p PEG 4/17 ? ?Hospital day # 10 ? ?Patient seen and examined by NP/APP with MD. MD to update note as needed.  ? ?Cortney E 5/17 , MSN, AGACNP-BC ?[facility] ?See Amion for schedule and pager information ?01/30/2022 1:19 PM ? ?ATTENDING NOTE: ?I reviewed above note and agree with the assessment and plan. Pt was seen and examined.  ? ?No family at bedside.  Patient still intubated, started to have fever earlier today, Tmax 101.6, put back on ventilation.  On exam, patient eyes closed, not following commands, bilateral pupil dilated and fixed, still has corneal and gag reflex.  However no movement in all extremities on pain stimulation, which is a change from 3 to 4 days ago.  EEG yesterday showed moderate diffuse encephalopathy, no seizure.  Will repeat MRI brain for further evaluation.  Continue current management with Plavix and statin, continue post trach and PEG care. ? ?For detailed assessment and plan, please refer to above as I have made changes wherever appropriate.  ? ?02/01/2022, MD PhD ?Stroke  Neurology ?01/30/2022 ?7:13 PM ? ?This patient is critically ill due to multifocal embolic infarcts, alcohol abuse, respiratory failure, seizure-like activity, fever and at significant risk of neurological worsening, death form recurrent stroke, status epilepticus, aspiration, hemorrhagic transformation, sepsis. This patient's care requires constant monitoring of vital signs, hemodynamics, respiratory and cardiac monitoring, review of multiple databases, neurological assessment, discussion with family, other specialists and medical decision making of high complexity. I spent 30 minutes of neurocritical care time in the care of this patient.  I discussed with Dr. 02/01/2022 CCM. ? ? ? ?To contact Stroke Continuity provider, please refer to Delton [place]. ?After hours, contact General Neurology  ?

## 2022-01-30 NOTE — Progress Notes (Signed)
Prior to MRI transport, noted bloody tracheal secretions, discussed w/ RN.  PT tranported to/from MRI via vent w/ no apparent complications.  Upon return to ICU pt RR was 35-38 bpm, pt placed on full vent support.  RN aware.   ?

## 2022-01-30 NOTE — Progress Notes (Signed)
Pt was placed on cpap/psv while preparing ATC for SBT.  Noted RR 38 bpm on cpap/psv, increased psv to 15, RR still 38 bpm.  Pt placed back on full vent support.  ?

## 2022-01-30 NOTE — Progress Notes (Addendum)
? ?NAME:  Cindy Brooks, MRN:  [id number], DOB:  05/15/1971, LOS: 10 ?ADMISSION DATE:  01/20/2022, CONSULTATION DATE: 01/20/2022 ?REFERRING MD: [facility], CHIEF COMPLAINT: Altered mental status ? ?History of Present Illness:  ?51 year old female who was found outside in her car by family on 4/8 and was moved to the house and left for 5 hours at which time she did not move. Question if she had seizures therefore emergency medical services was activated.  She is transferred to [facility] emergency department near obtunded has extremely loud respirations copious secretions and difficulty controlling her airway.  She has been seen by neurology.  She admitted to the intensive care unit for further evaluation and treatment. ? ?Pertinent  Medical History  ?HTN  ?TIA ? 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 Trach placed.  ?4/18 PEG placed. Spiking fever. Lactate spiked. Got fluid. Cleared.  ? ?Interim History / Subjective:  ?Did not wean well this morning initially. I flipper her again to 15/5 PSV and she is tolerating well currently.  ? ?Objective   ?Blood pressure (!) 132/96, pulse (!) 107, temperature 99.7 ?F (37.6 ?C), temperature source Axillary, resp. rate 18, height 5\' 4"  (1.626 m), weight 77.3 kg, SpO2 98 %. ?   ?Vent Mode: PRVC ?FiO2 (%):  [40 %] 40 % ?Set Rate:  [18 bmp] 18 bmp ?Vt Set:  [440 mL] 440 mL ?PEEP:  [5 cmH20] 5 cmH20 ?Plateau Pressure:  [17 cmH20-20 cmH20] 20 cmH20  ? ?Intake/Output Summary (Last 24 hours) at 01/30/2022 0849 ?Last data filed at 01/30/2022 0800 ?Gross per 24 hour  ?Intake 1520.5 ml  ?Output 850 ml  ?Net 670.5 ml  ? ? ?Filed Weights  ? 01/27/22 0459 01/28/22 0332 01/30/22 0500  ?Weight: 81.8 kg 77.6 kg 77.3 kg  ? ? ?Examination: ?General:  middle aged female  on vent ?Neuro:  Coma ?HEENT:  [place]/AT, No JVD noted, PERRL ?Cardiovascular:  RRR, no MRG. No sig edema.  ?Lungs:  Clear bilateral breath sounds ?Abdomen:  Soft, non-distended. Abd binder in place r/t PEG ?Musculoskeletal:  No acute deformity ?Skin:  Intact, MMM ? ? ?Resolved Hospital Problem list   ?Urinary tract infection (completed treatment) ?Seizure  ? ?Assessment & Plan:  ?Principal Problem: ?  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  Acute encephalopathy ?  Electrolyte and fluid disorder ?  Respiratory failure ([place]) ?  Tracheostomy dependence ([place]) ?  Hypertension ?  AKI (acute kidney injury) ([place]) ?  Lactic acidosis ?  SIRS (systemic inflammatory response syndrome) ([facility]) ?  PEG (percutaneous endoscopic gastrostomy) status ([place]) ? ? ?Acute midbrain/bilateral thalamic stroke c/b acute metabolic encephalopathy w/ possible Wernicke's given h/o ETOH abuse & reported seizure  ?-last EEG neg  ?Plan ?Provigil per stroke team  ?Secondary stroke prevention Plavix and statin (No ASA as allergic) ?Cont thiamine and folate  ?MRI first week of May ? ?Acute Hypoxemic Respiratory Failure; s/p trach 4/16 ?Plan ?VAP bundle ?RASS goal 0 ?Struggling to wean. On 15/5 this morning and tolerating. Would not tolerate decreasing pressure support to 12.  ? ?Fever/SIRS response. Lactic acidosis. Acutely after PEG>4/17. Resolved w/ IVFs. Wbc actually trending down (lactate cleared). Still tachycardic also a concern would be could she be withdrawing ? Subclinical sz? EEG neg 4/19. PCT neg. CXR with no explanation.  ?Plan ?Cont to  trend fever and WBC curve ?EEG  ?Librium taper ? ?Hypertension ?Plan ?Metoprolol  ? ?Mild AKI ?Plan ?Follow chemistry ?I&O ?Renal dose meds ? ?Fluid and electrolyte imbalance: hypernatremia, hyperchloremia ?Plan ?Increase free water ?Follow chemistry ? ?Dysphagia s/p CVA. Now has PEG ?Plan ?TF ? ?Best Practice (right click and "Reselect all SmartList Selections" daily)  ?Diet/type: NPO with tube feeds ?DVT  prophylaxis: LMWH ?GI prophylaxis: PPI ?Lines: N/A ?Foley:  N/A ?Code Status:  full code ?Last date of multidisciplinary goals of care discussion: daughters (2 in person) updated at bedside 4/14, please see plan of care note ? ? ? ?Critical care time: 36 min   ? ? ?Georgann Housekeeper, AGACNP-BC ?[facility] ? ?See Amion for personal pager ?PCCM on call pager [phone] until 7pm. ?Please call Elink 7p-7a. [phone] ? ?01/30/2022 9:10 AM ? ? ? ? ? ? ? ? ? ?

## 2022-01-31 ENCOUNTER — Inpatient Hospital Stay (HOSPITAL_COMMUNITY): Payer: No Typology Code available for payment source

## 2022-01-31 DIAGNOSIS — N179 Acute kidney failure, unspecified: Secondary | ICD-10-CM

## 2022-01-31 DIAGNOSIS — J9690 Respiratory failure, unspecified, unspecified whether with hypoxia or hypercapnia: Secondary | ICD-10-CM

## 2022-01-31 DIAGNOSIS — R569 Unspecified convulsions: Secondary | ICD-10-CM

## 2022-01-31 DIAGNOSIS — E878 Other disorders of electrolyte and fluid balance, not elsewhere classified: Secondary | ICD-10-CM

## 2022-01-31 LAB — BASIC METABOLIC PANEL
Anion gap: 6 (ref 5–15)
BUN: 23 mg/dL — ABNORMAL HIGH (ref 6–20)
CO2: 29 mmol/L (ref 22–32)
Calcium: 9.3 mg/dL (ref 8.9–10.3)
Chloride: 119 mmol/L — ABNORMAL HIGH (ref 98–111)
Creatinine, Ser: 0.94 mg/dL (ref 0.44–1.00)
GFR, Estimated: >60 mL/min (ref >60)
Glucose, Bld: 139 mg/dL — ABNORMAL HIGH (ref 70–99)
Potassium: 4.6 mmol/L (ref 3.5–5.1)
Sodium: 154 mmol/L — ABNORMAL HIGH (ref 135–145)

## 2022-01-31 LAB — CBC
HCT: 37.5 % (ref 36.0–46.0)
Hemoglobin: 12.1 g/dL (ref 12.0–15.0)
MCH: 27.6 pg (ref 26.0–34.0)
MCHC: 32.3 g/dL (ref 30.0–36.0)
MCV: 85.4 fL (ref 80.0–100.0)
Platelets: 281 10*3/uL (ref 150–400)
RBC: 4.39 MIL/uL (ref 3.87–5.11)
RDW: 15 % (ref 11.5–15.5)
WBC: 14.7 10*3/uL — ABNORMAL HIGH (ref 4.0–10.5)
nRBC: 0 % (ref 0.0–0.2)

## 2022-01-31 LAB — OSMOLALITY, URINE: Osmolality, Ur: 532 mOsm/kg (ref 300–900)

## 2022-01-31 LAB — GLUCOSE, CAPILLARY
Glucose-Capillary: 131 mg/dL — ABNORMAL HIGH (ref 70–99)
Glucose-Capillary: 135 mg/dL — ABNORMAL HIGH (ref 70–99)
Glucose-Capillary: 146 mg/dL — ABNORMAL HIGH (ref 70–99)
Glucose-Capillary: 151 mg/dL — ABNORMAL HIGH (ref 70–99)
Glucose-Capillary: 151 mg/dL — ABNORMAL HIGH (ref 70–99)
Glucose-Capillary: 178 mg/dL — ABNORMAL HIGH (ref 70–99)

## 2022-01-31 LAB — PROCALCITONIN: Procalcitonin: <0.1 ng/mL

## 2022-01-31 LAB — OSMOLALITY: Osmolality: 332 mOsm/kg (ref 275–295)

## 2022-01-31 LAB — SODIUM, URINE, RANDOM: Sodium, Ur: 30 mmol/L

## 2022-01-31 IMAGING — DX DG CHEST 1V PORT
1 series · 2 of 2 positions shown · non-contrast
Comparison: Chest radiograph [DATE]

CLINICAL DATA: Altered mental status

EXAM:
PORTABLE CHEST 1 VIEW

[Series 1: chest · 0.14mm/px · 2 of 2 slices shown]
[im 1/2]
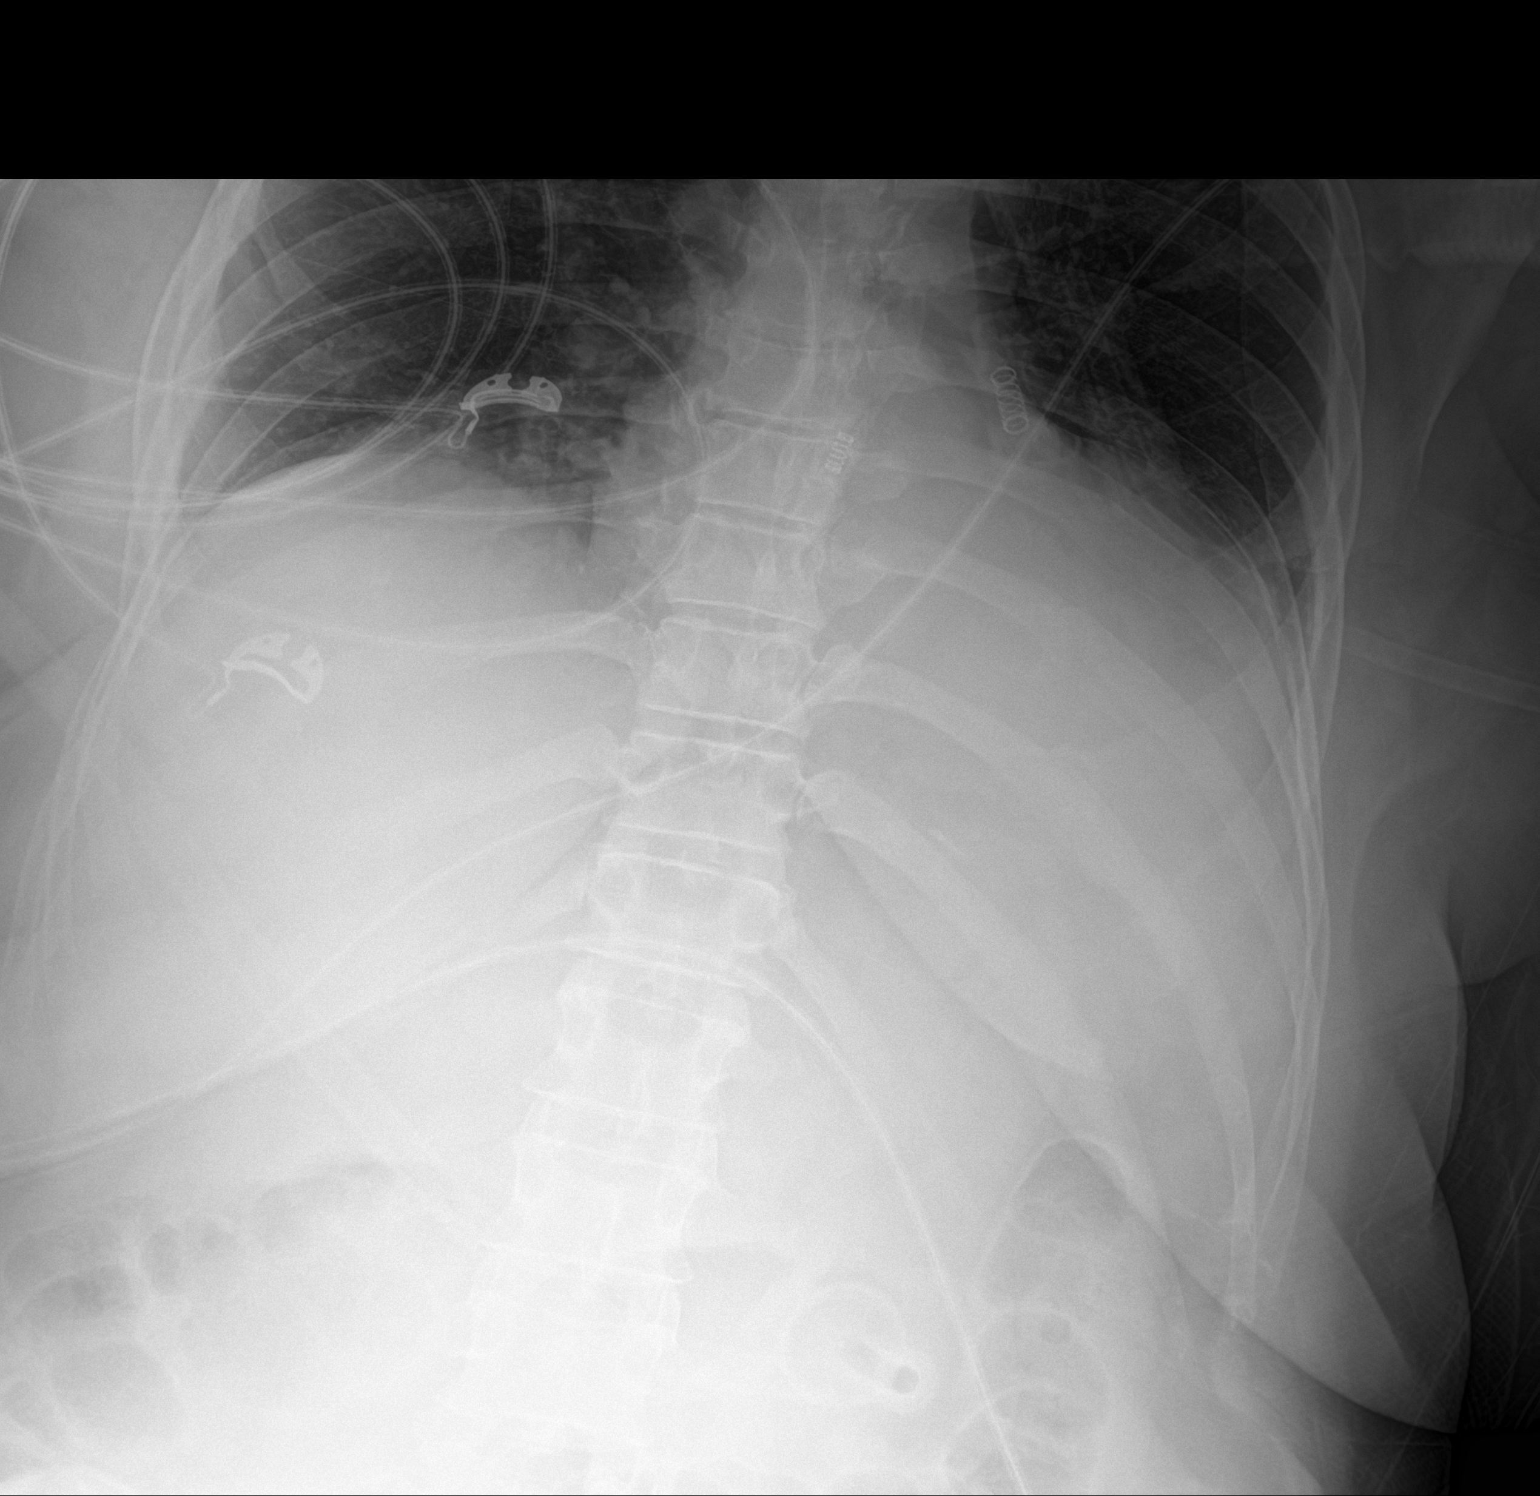
[im 2/2]
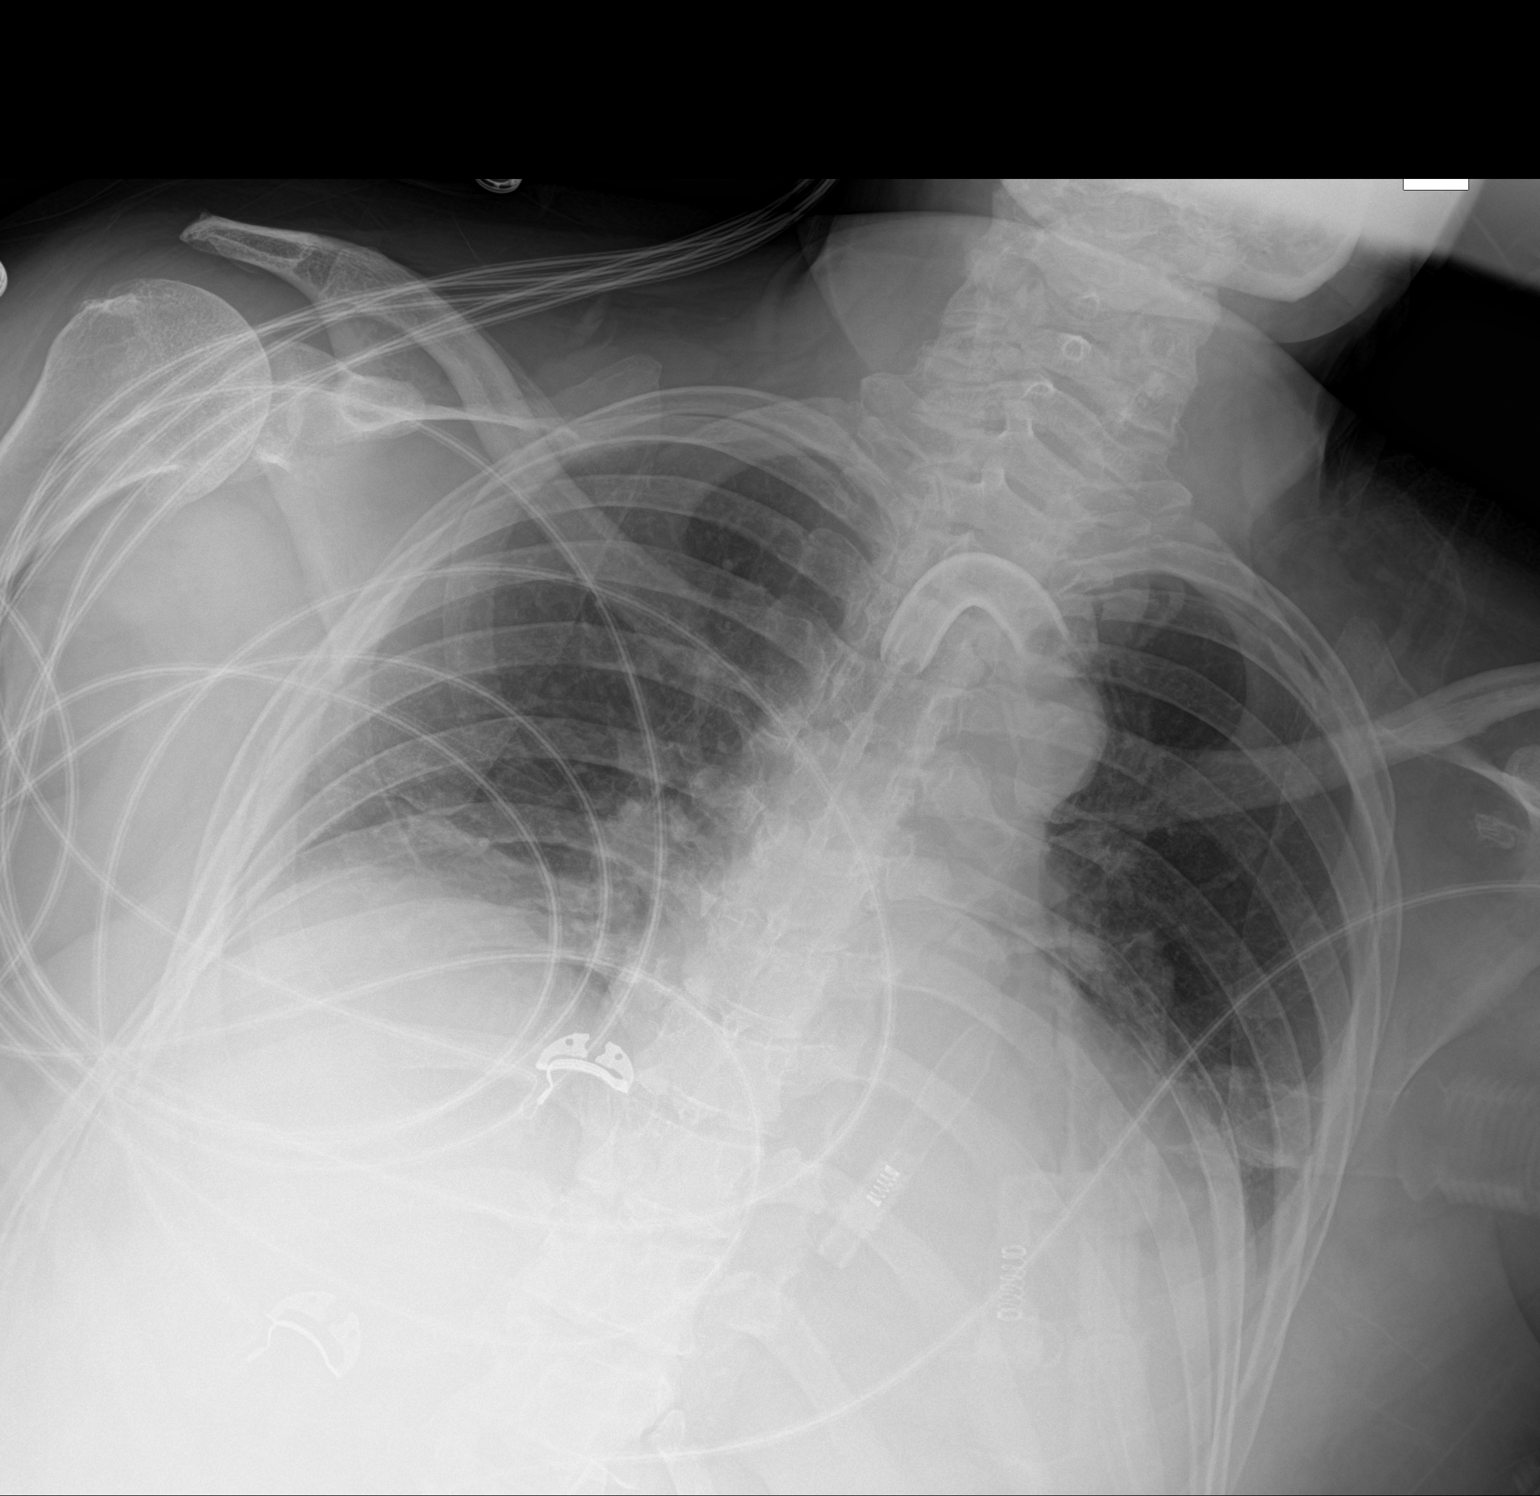

[2 of 2 positions shown; findings below may reference images not displayed]

FINDINGS: Tracheostomy tube is centered over the thoracic trachea. The cardiac
silhouette is partially obscured but appears enlarged and
unchanged. Low lung volumes with dependent opacities likely
reflecting atelectasis. Left lower lobe opacity with obscuration of
the left hemidiaphragm is unchanged may reflect consolidation or
atelectasis. Stable small left pleural effusion. The visualized
skeletal structures are unchanged.
IMPRESSION: Low lung volumes with dependent streaky opacities favor atelectasis.
Left lower lobe opacity with obscuration of the left hemidiaphragm
is unchanged and may reflect consolidation or atelectasis.

Stable small left pleural effusion.

## 2022-01-31 MED ORDER — CHLORHEXIDINE GLUCONATE 0.12 % MT SOLN
OROMUCOSAL | Status: AC
  Filled 2022-01-31: qty 15

## 2022-01-31 MED ORDER — FREE WATER
250.0000 mL | Status: DC
  Administered 2022-01-31 – 2022-02-07 (×42): 250 mL

## 2022-01-31 NOTE — Progress Notes (Signed)
Serum Osmo elevated 332. NP Renae Fickle notified. ?

## 2022-01-31 NOTE — Progress Notes (Signed)
Nutrition Follow-up ? ?DOCUMENTATION CODES:  ? ?Not applicable ? ?INTERVENTION:  ? ?Tube feeds via PEG tube: ?- Vital 1.5 @ 55 ml/hr (1320 ml/day) ?- ProSource TF 45 ml daily ? ?Tube feeding regimen provides 2020 kcal, 100 grams of protein, and 1008 ml of H2O. ? ?- Continue MVI with minerals daily per tube ? ?- 250 ml free water every 4 hours ?Total free water: 2508 ml  ? ? ?NUTRITION DIAGNOSIS:  ? ?Inadequate oral intake related to inability to eat as evidenced by NPO status. ?Ongoing ? ?GOAL:  ? ?Patient will meet greater than or equal to 90% of their needs ?Met with TF at goal  ? ?MONITOR:  ? ?Vent status, Labs, TF tolerance, I & O's ? ?REASON FOR ASSESSMENT:  ? ?Ventilator, Consult ?Enteral/tube feeding initiation and management ? ?ASSESSMENT:  ? ?51 year old female who presented to the ED on 4/09 with AMS. PMH of EtOH abuse, HTN, TIA, medication noncompliance. Pt admitted with acute encephalopathy. ? ?Pt weaning on vent, per CCM pt may need long term vent support. Noted new watershed infarcts per MRI.  ?Unable to change TF to Jevity 1.5 or Osmolite 1.5 due to TF shortages.  ? ?04/09 - pt intubated for airway protection ?04/17 - s/p trach ?04/18 - s/p PEG ?04/19 - per MRI pt with new watershed infarcts in B frontal, B parietal, and L temporal lobes.  ? ?Admit weight: 78.7 kg (BMI 29.8) ?Current weight: 77 kg ? ?Medications reviewed and include: colace, folic acid, SSI, MVI with minerals daily, protonix, miralax, thiamine  ? ?Vitamin/Mineral Profile: ?Vitamin B6: 4.6 (WNL) ?Vitamin B12: 930 (H) ?Vitamin A: 44.9 (WNL) ?Zinc: 69 (WNL)  ?CRP: 7.6 (H) ?Vitamin B1: 365.8 (4/9) ? ?Labs reviewed: Na 154 ?CBG's: 101-209 x 24 hours ? ?Diet Order:   ?Diet Order   ? ?       ?  Diet NPO time specified  Diet effective now       ?  ? ?  ?  ? ?  ? ? ?EDUCATION NEEDS:  ? ?No education needs have been identified at this time ? ?Skin:  Skin Assessment: Reviewed RN Assessment ? ?Last BM:  4/19 type 7 x 2 ? ?Height:  ? ?Ht Readings  from Last 1 Encounters:  ?01/20/22 5' 4"  (1.626 m)  ? ? ?Weight:  ? ?Wt Readings from Last 1 Encounters:  ?01/31/22 77 kg  ? ? ?BMI:  Body mass index is 29.14 kg/m?. ? ?Estimated Nutritional Needs:  ? ?Kcal:  1800-2000 ? ?Protein:  90-110 grams ? ?Fluid:  1.8-2.0 L ? ? ?Lockie Pares., RD, LDN, CNSC ?See AMiON for contact information  ? ? ?

## 2022-01-31 NOTE — Progress Notes (Signed)
? ?NAME:  Eunise Wiggin, MRN:  [id number], DOB:  1971-07-18, LOS: 11 ?ADMISSION DATE:  01/20/2022, CONSULTATION DATE: 01/20/2022 ?REFERRING MD: Triad, CHIEF COMPLAINT: Altered mental status ? ?History of Present Illness:  ?51 year old female who was found outside in her car by family on 4/8 and was moved to the house and left for 5 hours at which time she did not move. Question if she had seizures therefore emergency medical services was activated.  She is transferred to [facility] emergency department near obtunded has extremely loud respirations copious secretions and difficulty controlling her airway.  She has been seen by neurology.  She admitted to the intensive care unit for further evaluation and treatment. ? ?Pertinent  Medical History  ?HTN  ?TIA ? 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 Trach placed.  ?4/18 PEG placed. Spiking fever. Lactate spiked. Got fluid. Cleared.  ? ?Interim History / Subjective:  ?Weaning 15/5 this morning. MRI yesterday demonstrating watershed infarct pattern.  ? ?Objective   ?Blood pressure (!) 137/92, pulse (!) 112, temperature 100.3 ?F (37.9 ?C), temperature source Axillary, resp. rate 19, height 5\' 4"  (1.626 m), weight 77 kg, SpO2 96 %. ?   ?Vent Mode: PSV;CPAP ?FiO2 (%):  [40 %] 40 % ?Set Rate:  [12 bmp] 12 bmp ?Vt Set:  [440 mL] 440 mL ?PEEP:  [5 cmH20] 5 cmH20 ?Pressure Support:  [15 cmH20] 15 cmH20 ?Plateau Pressure:  [11 cmH20-21 cmH20] 11 cmH20  ? ?Intake/Output Summary (Last 24 hours) at 01/31/2022 0931 ?Last data filed at 01/31/2022 0600 ?Gross per 24 hour  ?Intake 1690.12 ml  ?Output 1000 ml  ?Net 690.12 ml  ? ? ?Filed Weights  ? 01/28/22 0332 01/30/22 0500 01/31/22 0500  ?Weight: 77.6 kg 77.3 kg 77 kg  ? ? ?Examination: ?General:   Middle aged female on vent via thrach ?Neuro:  Pupils 40mm and fixed ?HEENT:  [place]/AT, no JVD, trach in place midline. No drainage.  ?Cardiovascular:  RRR, no MRG no edema.  ?Lungs:  Clear, no distress on wean.  ?Abdomen:  Soft, Non-distended. Abdominal binder in place.  ?Musculoskeletal:  No acute deformity ?Skin:  Intact, MMM ? ? ?Resolved Hospital Problem list   ?Urinary tract infection (completed treatment) ?Seizure  ? ?Assessment & Plan:  ? ?Acute midbrain/bilateral thalamic stroke c/b acute metabolic encephalopathy w/ possible Wernicke's given h/o ETOH abuse & reported seizure  ?Watershed infarct: exam worse ~ 4/17. Had been moving upper and stopped. MRI done 4/19 showed water shed distribution infarcts. Embolic source unclear perhaps hypoperfusion from low vs relative low BP.  ?-last EEG neg  ?Plan ?Provigil per stroke team  ?Secondary stroke prevention Plavix and statin (No ASA as allergic) ?Continue thiamine and folate  ? ?Acute Hypoxemic Respiratory Failure; s/p trach 4/16 ?Plan ?VAP bundle ?RASS goal 0 ?Struggling to wean. On 15/5 this morning and tolerating. Pressure support decreased to 12 doing OK ?Will need LTACH vs Vent SNF ?Check CXR ? ?Fever/SIRS response. Lactic acidosis. Acutely after PEG>4/17. Resolved w/ IVFs. Wbc actually trending down (lactate cleared). Still tachycardic also a concern would be could she be withdrawing ? Subclinical sz? EEG neg 4/19. PCT neg. CXR with no explanation.  ?Plan ?- Librium taper DC with exam worsening.  ?- repeat CXR today ? ?Hypertension ?Plan ?- Metoprolol  ? ?Mild AKI ?Plan ?-  Follow chemistry ?- I&O ?- Renal dose meds ? ?Fluid and electrolyte imbalance: hypernatremia, hyperchloremia ?Plan ?- Increase free water again today ?- Check urine sodium/osm ?- Follow chemistry ? ?Dysphagia s/p CVA. Now has PEG ?Plan ?- TF ? ?Best Practice (right click and "Reselect all SmartList Selections" daily)  ?Diet/type: NPO with tube feeds ?DVT prophylaxis: LMWH ?GI prophylaxis:  PPI ?Lines: N/A ?Foley:  N/A ?Code Status:  full code ?Last date of multidisciplinary goals of care discussion: daughters (2 in person) updated at bedside 4/14, please see plan of care note ? ?Spoke with family at length 4/20. Both daughters on the phone with me were very upset at their mother's course. I assured them we are doing all we can do to provide her with a positive outcome, and unfortunately she has had additional infarcts. They stressed the need for better communication from the medical team.  ? ? ?Critical care time:  40 min   ? ? ?Georgann Housekeeper, AGACNP-BC ?[facility] ? ?See Amion for personal pager ?PCCM on call pager [phone] until 7pm. ?Please call Elink 7p-7a. [phone] ? ?01/31/2022 9:31 AM ? ? ? ? ? ? ? ? ? ?

## 2022-01-31 NOTE — Progress Notes (Addendum)
STROKE TEAM PROGRESS NOTE  ? ?INTERVAL HISTORY ?Patient has been hemodynamically stable but febrile overnight.  Her neurological exam is unchanged, and MRI performed yesterday shows new infarcts in bilateral frontal and parietal lobes, possibly watershed infarcts. ?.  ?Vitals:  ? 01/31/22 1000 01/31/22 1100 01/31/22 1144 01/31/22 1200  ?BP: 126/77 129/82  134/88  ?Pulse: 90 93  (!) 101  ?Resp: (!) 31 (!) 33  (!) 36  ?Temp:    99.3 ?F (37.4 ?C)  ?TempSrc:    Axillary  ?SpO2: 99% 97% (!) 9% 98%  ?Weight:      ?Height:      ? ?CBC:  ?Recent Labs  ?Lab 01/30/22 ?0418 01/31/22 ?[id number]  ?WBC 13.8* 14.7*  ?HGB 11.8* 12.1  ?HCT 36.7 37.5  ?MCV 85.0 85.4  ?PLT 304 281  ? ? ?Basic Metabolic Panel:  ?Recent Labs  ?Lab 01/30/22 ?0418 01/31/22 ?[id number]  ?NA 152* 154*  ?K 4.3 4.6  ?CL 116* 119*  ?CO2 29 29  ?GLUCOSE 164* 139*  ?BUN 23* 23*  ?CREATININE 1.10* 0.94  ?CALCIUM 9.3 9.3  ? ? ?Lipid Panel:  ?No results for input(s): CHOL, TRIG, HDL, CHOLHDL, VLDL, LDLCALC in the last 168 hours. ? ?HgbA1c:  ?No results for input(s): HGBA1C in the last 168 hours. ? ?Urine Drug Screen:  ?No results for input(s): LABOPIA, COCAINSCRNUR, LABBENZ, AMPHETMU, THCU, LABBARB in the last 168 hours. ?  ?Alcohol Level  ?No results for input(s): ETH in the last 168 hours. ? ? ?IMAGING past 24 hours ?MR BRAIN WO CONTRAST ? ?Result Date: 01/30/2022 ?CLINICAL DATA:  Stroke, follow-up EXAM: MRI HEAD WITHOUT CONTRAST TECHNIQUE: Multiplanar, multiecho pulse sequences of the brain and surrounding structures were obtained without intravenous contrast. COMPARISON:  01/22/2022 MRI head FINDINGS: Brain: Scattered foci of restricted diffusion with ADC correlates in the bilateral frontal lobes medially (series 5, image [id number]) which were not present to this extent on the prior exam. Additional focal areas of restricted diffusion in the periventricular right parietal lobe (series 5, image 90), left parietal and frontal lobe (series 5, image 89), and left medial temporal  lobe (series 5, image 8), which were not present on the prior exam. Decreased intensity of patchy restricted diffusion in the medial thalami, bilaterally and symmetrically, and the midbrain, consistent with expected evolution of previously noted infarct. No acute hemorrhage, mass, mass effect, or midline shift. No hydrocephalus or extra-axial collection. Lacunar infarct in the left basal ganglia and genu of the corpus callosum. Redemonstrated foci of hemosiderin deposition, likely hypertensive microhemorrhages; no new foci. Vascular: Normal flow voids. Skull and upper cervical spine: Normal marrow signal. Sinuses/Orbits: Mucosal thickening in the maxillary sinuses and sphenoid sinuses, with some bubbly appearing fluid in the posterior right ethmoid air cells and left maxillary sinus. Other: Trace fluid in the mastoid air cells. IMPRESSION: 1. Multiple foci of restricted diffusion in the bilateral frontal lobes and parietal lobes primarily, some of which may represent watershed infarcts, although additional more isolated suggest an embolic etiology. 2. Decreased conspicuity of restricted diffusion associated with the previously noted artery of percheron infarct, involving both thalami midbrain. These results will be called to the ordering clinician or representative by the Radiologist Assistant, and communication documented in the PACS or [employer]. Electronically Signed   By: Merilyn Baba M.D.   On: 01/30/2022 15:53  ? ?DG CHEST PORT 1 VIEW ? ?Result Date: 01/31/2022 ?CLINICAL DATA:  Altered mental status EXAM: PORTABLE CHEST 1 VIEW COMPARISON:  Chest radiograph January 29, 2022 FINDINGS:  Tracheostomy tube is centered over the thoracic trachea. The cardiac silhouette is partially obscured but appears enlarged and unchanged. Low lung volumes with dependent opacities likely reflecting atelectasis. Left lower lobe opacity with obscuration of the left hemidiaphragm is unchanged may reflect consolidation or  atelectasis. Stable small left pleural effusion. The visualized skeletal structures are unchanged. IMPRESSION: Low lung volumes with dependent streaky opacities favor atelectasis. Left lower lobe opacity with obscuration of the left hemidiaphragm is unchanged and may reflect consolidation or atelectasis. Stable small left pleural effusion. Electronically Signed   By: Dahlia Bailiff M.D.   On: 01/31/2022 10:40   ? ?PHYSICAL EXAM ?General:  Intubated, obese middle-aged African-American patient in no acute distress ?Respiratory:  Respirations synchronous with ventilator, s/p trach ?Head is nontraumatic. Neck is supple. S/p PEG ?Neurological Exam :  ?No sedation.  Eyes are closed.  She does not respond to sternal rub and does not move extremities to noxious stimuli. ?Pupils 56mm bilaterally and nonreactive, corneal reflexes intact, oculocephalic reflex weak, cough and gag reflexes present.  Not following commands.  No spontaneous extremity movements noted.  Plantars both mute.  ? ?ASSESSMENT/PLAN ?[name] is a 51 y.o. female with history of HTN, TIA, ETOH abuse and medication noncompliance presenting after she was found by her family to be unresponsive in her car on 4/8.  She was moved to her couch, and when her family checked on her again on 4/9, she had not moved and EMS was called.  She was brought to the hospital and intubated for inability to protect her airways.  Workup for acute encephalopathy was started.  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  Multiple punctate acute ischemic infarcts were also seen in bilateral cerebral hemispheres.  CTA head and neck performed this morning shows severe bilateral P1 and right P2 stenoses as well as proximal left M2 stenosis and severe right ACA stenosis. ? ?Stroke: artery of Percheron infarcts and b/l hemisphere punctate embolic strokes, source unclear - ? APS ?CT head No  acute abnormality.  ?CTA head & neck severe bilateral P1 and right P2 stenoses as well as proximal left M2 stenosis and severe right ACA stenosis, 40% stenosis of left ICA ?CTV no dural venous sinus thrombosis ?MRI  Patchy restricted diffusion in bilateral thalami and midbrain with multiple punctate acute ischemic infarcts in bilateral cerebral hemispheres and multiple chronic microhemorrhages ?Repeat MRI 4/19 multiple infarcts in bilateral frontal and parietal lobes, possibly in watershed distribution ?2D Echo EF 55 to 60%.  Left atrium mildly dilated. 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 (Aspirin allergy) ?Therapy recommendations:  pending ?Disposition:  pending ? ??? Wernicke's encephalopathy ?Patient has a history of heavy alcohol use ?MRI shows DWI changes in bilateral thalami and midbrain  ( versus changes from severe wernicke`s encephalopathy less likely ) ?Thiamine supplement ?On phenobarbital for alcohol withdraw  ? ?Respiratory failure ?Patient was intubated for inability to protect airway ?Ventilator management per CCM ?Difficulty being off ventilation ?S/p Trach 4/16, still on vent ? ?Fever and leukocytosis ?Tmax 101.6-> 101.1-> 100.6 ?WBC 11.6-> 17.5-> 13.5-> 13.8-> 14.7 ?Blood culture pending ?CXR consolidation versus atelectasis ? ?  Hypertension ?Home meds:  none ?Stable ?Long-term BP goal normotensive ? ?Hyperlipidemia ?Home meds:  none ?LDL 132, goal < 70 ?Rosuvastatin 40 mg daily ?Continue statin on discharge ? ?Other Stroke Risk Factors ?Obesity, Body mass index is 29.14 kg/m?., BMI >/= 30 associated with increased stroke risk, recommend weight loss, diet and  exercise as appropriate  ?Hx TIA ? ?Other Active Problems ?Suspected seizures ?Question of seizures prior arrival ?LTM EEG revealed no seizure ?On keppra 1000mg  PO ? ?Risk for ETOH withdrawal ?Patient has histo

## 2022-01-31 NOTE — Progress Notes (Signed)
PT Cancellation Note ? ?Patient Details ?Name: Cindy Brooks ?MRN: [id number] ?DOB: [birth year] ? ? ?Cancelled Treatment:    Reason Eval/Treat Not Completed: Patient's level of consciousness. Per RN pt remains unresponsive even to painful stimuli. PT will follow up as time allows. ? ? ?Arlyss Gandy ?01/31/2022, 10:01 AM ?

## 2022-01-31 NOTE — Progress Notes (Signed)
RN called- per CCM request pt placed back on full vent support.   ?

## 2022-01-31 NOTE — Progress Notes (Signed)
SLP Cancellation Note ? ?Patient Details ?Name: Cindy Brooks ?MRN: [id number] ?DOB: 04/18/1971 ? ? ?Cancelled treatment:       Reason Eval/Treat Not Completed: Patient not medically ready (Pt remains on the vent at this is time and per chart is barely responsive to noxious stimuli. SLP will follow up on subsequent date.) ? ?Chrys Landgrebe I. Vear Clock, MS, CCC-SLP ?[facility] ?Office number [phone] ?Pager [phone] ? ?Scheryl Marten ?01/31/2022, 10:33 AM ?

## 2022-02-01 DIAGNOSIS — L8990 Pressure ulcer of unspecified site, unspecified stage: Secondary | ICD-10-CM | POA: Diagnosis not present

## 2022-02-01 LAB — GLUCOSE, CAPILLARY
Glucose-Capillary: 128 mg/dL — ABNORMAL HIGH (ref 70–99)
Glucose-Capillary: 124 mg/dL — ABNORMAL HIGH (ref 70–99)
Glucose-Capillary: 167 mg/dL — ABNORMAL HIGH (ref 70–99)
Glucose-Capillary: 118 mg/dL — ABNORMAL HIGH (ref 70–99)
Glucose-Capillary: 164 mg/dL — ABNORMAL HIGH (ref 70–99)
Glucose-Capillary: 166 mg/dL — ABNORMAL HIGH (ref 70–99)

## 2022-02-01 LAB — COMPREHENSIVE METABOLIC PANEL
ALT: 92 U/L — ABNORMAL HIGH (ref 0–44)
AST: 56 U/L — ABNORMAL HIGH (ref 15–41)
Albumin: 2.9 g/dL — ABNORMAL LOW (ref 3.5–5.0)
Alkaline Phosphatase: 64 U/L (ref 38–126)
Anion gap: 6 (ref 5–15)
BUN: 20 mg/dL (ref 6–20)
CO2: 30 mmol/L (ref 22–32)
Calcium: 9.4 mg/dL (ref 8.9–10.3)
Chloride: 117 mmol/L — ABNORMAL HIGH (ref 98–111)
Creatinine, Ser: 0.96 mg/dL (ref 0.44–1.00)
GFR, Estimated: >60 mL/min (ref >60)
Glucose, Bld: 155 mg/dL — ABNORMAL HIGH (ref 70–99)
Potassium: 4.7 mmol/L (ref 3.5–5.1)
Sodium: 153 mmol/L — ABNORMAL HIGH (ref 135–145)
Total Bilirubin: 0.5 mg/dL (ref 0.3–1.2)
Total Protein: 7.5 g/dL (ref 6.5–8.1)

## 2022-02-01 LAB — CBC
HCT: 36.8 % (ref 36.0–46.0)
Hemoglobin: 11.5 g/dL — ABNORMAL LOW (ref 12.0–15.0)
MCH: 26.8 pg (ref 26.0–34.0)
MCHC: 31.3 g/dL (ref 30.0–36.0)
MCV: 85.8 fL (ref 80.0–100.0)
Platelets: 283 10*3/uL (ref 150–400)
RBC: 4.29 MIL/uL (ref 3.87–5.11)
RDW: 14.8 % (ref 11.5–15.5)
WBC: 12.6 10*3/uL — ABNORMAL HIGH (ref 4.0–10.5)
nRBC: 0 % (ref 0.0–0.2)

## 2022-02-01 LAB — MAGNESIUM: Magnesium: 2.6 mg/dL — ABNORMAL HIGH (ref 1.7–2.4)

## 2022-02-01 LAB — PHOSPHORUS: Phosphorus: 3.8 mg/dL (ref 2.5–4.6)

## 2022-02-01 MED ORDER — LEVETIRACETAM 500 MG PO TABS
1000.0000 mg | ORAL_TABLET | Freq: Two times a day (BID) | ORAL | Status: DC
  Administered 2022-02-01 – 2022-02-09 (×16): 1000 mg
  Filled 2022-02-01 (×16): qty 2

## 2022-02-01 NOTE — Progress Notes (Addendum)
? ?NAME:  Cindy Brooks, MRN:  [id number], DOB:  21-Oct-1970, LOS: 12 ?ADMISSION DATE:  01/20/2022, CONSULTATION DATE: 01/20/2022 ?REFERRING MD: Triad, CHIEF COMPLAINT: Altered mental status ? ?History of Present Illness:  ?51 year old female who was found outside in her car by family on 4/8 and was moved to the house and left for 5 hours at which time she did not move. Question if she had seizures therefore emergency medical services was activated.  She is transferred to [facility] emergency department near obtunded has extremely loud respirations copious secretions and difficulty controlling her airway.  She has been seen by neurology.  She admitted to the intensive care unit for further evaluation and treatment. ? ?Pertinent  Medical History  ?HTN  ?TIA ? 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 Trach placed.  ?4/18 PEG placed. Spiking fever. Lactate spiked. Got fluid. Cleared.  ? ?Interim History / Subjective:  ?Trach collar this morning and doing OK.  ? ?Objective   ?Blood pressure 133/88, pulse (!) 101, temperature (!) 100.7 ?F (38.2 ?C), temperature source Axillary, resp. rate (!) 37, height 5\' 4"  (1.626 m), weight 80 kg, SpO2 98 %. ?   ?Vent Mode: PRVC ?FiO2 (%):  [40 %] 40 % ?Set Rate:  [12 bmp] 12 bmp ?Vt Set:  [440 mL] 440 mL ?PEEP:  [5 cmH20] 5 cmH20 ?Pressure Support:  [12 cmH20] 12 cmH20 ?Plateau Pressure:  [13 cmH20-18 cmH20] 13 cmH20  ? ?Intake/Output Summary (Last 24 hours) at 02/01/2022 0924 ?Last data filed at 02/01/2022 0800 ?Gross per 24 hour  ?Intake 3084.99 ml  ?Output 1200 ml  ?Net 1884.99 ml  ? ? ?Filed Weights  ? 01/30/22 0500 01/31/22 0500 02/01/22 0446  ?Weight: 77.3 kg 77 kg 80 kg  ? ? ?Examination: ?General:  Middle aged female in bed ?Neuro:  Pupils 46mm  and fixed ?HEENT:  /AT, trach midline with no drainage.  ?Cardiovascular:  RRR, no MRG ?Lungs:  Clear, diminished bases. Moderate amount of thin secretions.  ?Abdomen:  Soft, non-distended. PEG with abd binder in place.  ?Musculoskeletal:  No acute deformity.  ?Skin:  Grossly intact.  ? ? ?Resolved Hospital Problem list   ?Urinary tract infection (completed treatment) ?Seizure  ? ?Assessment & Plan:  ? ?Acute midbrain/bilateral thalamic stroke c/b acute metabolic encephalopathy w/ possible Wernicke's given h/o ETOH abuse & reported seizure  ?Watershed infarct: exam worse ~ 4/17. Had been moving upper and stopped. MRI done 4/19 showed water shed distribution infarcts. Embolic source unclear perhaps hypoperfusion from low vs relative low BP. Last EEG neg. ANA positive x 2. Ribonucleic protein ab mildly elevated.  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 ? ?Acute Hypoxemic Respiratory Failure; s/p trach 4/16 ?- VAP bundle ?- RASS goal 0 ?- Trach collar this morning. Rapid shallow respirations. Doubt she will be able to remain off vent long, but will continue for now.  ?- Start chest PT for secretions.  ?- Will need LTACH vs Vent SNF ? ?Fever/SIRS response. Acutely after PEG>4/17. Resolved w/ IVFs. Wbc actually trending down (lactate cleared). Still tachycardic also a concern would be could  she be withdrawing ? Subclinical sz? EEG neg 4/19. PCT neg. CXR with no explanation.  ?Plan ?- Defer ABX ?- Supportive care ? ?Hypertension ?Plan ?- Metoprolol  ? ?Mild AKI improved ?Plan ?- Follow chemistry ?- I&O ? ?Fluid and electrolyte imbalance: hypernatremia, hyperchloremia ?Plan ?- Continue free water, sodium beginning to trend down ?- Follow chemistry ? ?Dysphagia s/p CVA. Now has PEG ?Plan ?- TF ? ?Best Practice  (right click and "Reselect all SmartList Selections" daily)  ?Diet/type: NPO with tube feeds ?DVT prophylaxis: LMWH ?GI prophylaxis: PPI ?Lines: N/A ?Foley:  N/A ?Code Status:  full code ?Last date of multidisciplinary goals of care discussion: daughters (2 in person) updated at bedside 4/14, please see plan of care note ? ?Spoke with family at length 4/20. Both daughters on the phone with me were very upset at their mother's course. I assured them we are doing all we can do to provide her with a positive outcome, and unfortunately she has had additional infarcts. They stressed the need for better communication from the medical team. Updated by Dr. Erlinda Hong as well.  ? ? ?Critical care time:   ? ? ?Georgann Housekeeper, AGACNP-BC ?[facility] ? ?See Amion for personal pager ?PCCM on call pager [phone] until 7pm. ?Please call Elink 7p-7a. [phone] ? ?02/01/2022 9:24 AM ? ? ? ? ? ? ? ? ? ?

## 2022-02-01 NOTE — Progress Notes (Signed)
Physical Therapy Treatment ?Patient Details ?Name: Cindy Brooks ?MRN: [id number] ?DOB: 1971-07-11 ?Today's Date: 02/01/2022 ? ? ?History of Present Illness Pt is a 51 y.o. female with history of HTN, TIA, ETOH abuse and medication noncompliance presenting to the ED after she was found by her family to be unresponsive in her car on 4/8. 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 Intubated 4/9. Tracheostormy 4/16. ? ?  ?PT Comments  ? ? Patient remains unresponsive, but improving respiratory status tolerating trach collar.  Noted L knee laxity with ROM, but no sign of pain with movement nor with stretching.  Assisted nursing with rolling for hygiene.  PT will continue to follow on trial basis one more week to determine if able to benefit from continued services.     ?Recommendations for follow up therapy are one component of a multi-disciplinary discharge planning process, led by the attending physician.  Recommendations may be updated based on patient status, additional functional criteria and insurance authorization. ? ?Follow Up Recommendations ? Skilled nursing-short term rehab (<3 hours/day) ?  ?  ?Assistance Recommended at Discharge Frequent or constant Supervision/Assistance  ?Patient can return home with the following Two people to help with walking and/or transfers;Two people to help with bathing/dressing/bathroom ?  ?Equipment Recommendations ? Other (comment) (TBA)  ?  ?Recommendations for Other Services   ? ? ?  ?Precautions / Restrictions Precautions ?Precautions: Fall ?Precaution Comments: trach/PEG  ?  ? ?Mobility ? Bed Mobility ?Overal bed mobility: Needs Assistance ?Bed Mobility: Rolling ?Rolling: Total assist, +2 for physical assistance ?  ?  ?  ?  ?General bed mobility comments: nursing cleaning pt upon my entry; assist to roll for  hygiene and bed linen change. ?  ? ?Transfers ?  ?  ?  ?  ?  ?  ?  ?  ?  ?  ?  ? ?Ambulation/Gait ?  ?  ?  ?  ?  ?  ?  ?  ? ? ?Stairs ?  ?  ?  ?  ?  ? ? ?Wheelchair Mobility ?  ? ?Modified Rankin (Stroke Patients Only) ?  ? ? ?  ?Balance   ?  ?  ?  ?  ?  ?  ?  ?  ?  ?  ?  ?  ?  ?  ?  ?  ?  ?  ?  ? ?  ?Cognition Arousal/Alertness: Lethargic ?Behavior During Therapy: Flat affect ?Overall Cognitive Status: Difficult to assess ?  ?  ?  ?  ?  ?  ?  ?  ?  ?  ?  ?  ?  ?  ?  ?  ?  ?  ?  ? ?  ?Exercises Other Exercises ?Other Exercises: Performed PROM all 4 extremities and stretch to heel cords and hamstrings, hip extensors. ?Other Exercises: positioning for head more to midline, tends to roll head to R ? ?  ?General Comments General comments (skin integrity, edema, etc.): on trach collar 40% FiO2; RR 27-40; HR 113, SpO2 95% ?  ?  ? ?Pertinent Vitals/Pain Pain Assessment ?Facial Expression: Relaxed, neutral ?Body Movements: Absence of movements ?Muscle Tension: Relaxed ?Compliance with ventilator (intubated pts.): N/A ?Vocalization (extubated pts.): Talking in normal tone or no sound ?CPOT Total: 0  ? ? ?Home Living   ?  ?  ?  ?  ?  ?  ?  ?  ?  ?   ?  ?  Prior Function    ?  ?  ?   ? ?PT Goals (current goals can now be found in the care plan section) Progress towards PT goals: Not progressing toward goals - comment ? ?  ?Frequency ? ? ? Min 2X/week ? ? ? ?  ?PT Plan Current plan remains appropriate  ? ? ?Co-evaluation   ?  ?  ?  ?  ? ?  ?AM-PAC PT "6 Clicks" Mobility   ?Outcome Measure ? Help needed turning from your back to your side while in a flat bed without using bedrails?: Total ?Help needed moving from lying on your back to sitting on the side of a flat bed without using bedrails?: Total ?Help needed moving to and from a bed to a chair (including a wheelchair)?: Total ?Help needed standing up from a chair using your arms (e.g., wheelchair or bedside chair)?: Total ?Help needed to walk in hospital room?: Total ?Help  needed climbing 3-5 steps with a railing? : Total ?6 Click Score: 6 ? ?  ?End of Session Equipment Utilized During Treatment: Oxygen ?Activity Tolerance: Other (comment) (nonresponsive, not opening eyes or responding to painful stimuli) ?Patient left: in bed;with bed alarm set ?  ?PT Visit Diagnosis: Other abnormalities of gait and mobility (R26.89) ?  ? ? ?Time: 1145-1200 ?PT Time Calculation (min) (ACUTE ONLY): 15 min ? ?Charges:  $Therapeutic Activity: 8-22 mins          ?          ? ?Sheran Lawless, PT ?Acute Rehabilitation Services ?Pager:[phone] ?Office:[phone] ?02/01/2022 ? ? ? ?Elray Mcgregor ?02/01/2022, 1:12 PM ? ?

## 2022-02-01 NOTE — Progress Notes (Signed)
STROKE TEAM PROGRESS NOTE  ? ?INTERVAL HISTORY ?No family at the bedside. Pt lying in bed, on trach collar now, still not unresponsive, not moving extremities. Neuro unchanged. I had extensive time yesterday afternoon with pt families (3 daughters and 1 son), updating pt condition and poor prognosis. They did not make any decision regarding code status. Still has fever and leukocytosis with slight improvement. Renal function improved, still has hypernatremia.  ?.  ?Vitals:  ? 02/01/22 1000 02/01/22 1100 02/01/22 1153 02/01/22 1200  ?BP: (!) 141/90 127/87  120/88  ?Pulse: (!) 102 86 91 89  ?Resp: (!) 23 (!) 37 (!) 32 (!) 34  ?Temp:    99.2 ?F (37.3 ?C)  ?TempSrc:    Axillary  ?SpO2: 95% 96% 95% 95%  ?Weight:      ?Height:      ? ?CBC:  ?Recent Labs  ?Lab 01/31/22 ?[id number] 02/01/22 ?0346  ?WBC 14.7* 12.6*  ?HGB 12.1 11.5*  ?HCT 37.5 36.8  ?MCV 85.4 85.8  ?PLT 281 283  ? ?Basic Metabolic Panel:  ?Recent Labs  ?Lab 01/31/22 ?[id number] 02/01/22 ?0346  ?NA 154* 153*  ?K 4.6 4.7  ?CL 119* 117*  ?CO2 29 30  ?GLUCOSE 139* 155*  ?BUN 23* 20  ?CREATININE 0.94 0.96  ?CALCIUM 9.3 9.4  ?MG  --  2.6*  ?PHOS  --  3.8  ? ?Lipid Panel:  ?No results for input(s): CHOL, TRIG, HDL, CHOLHDL, VLDL, LDLCALC in the last 168 hours. ? ?HgbA1c:  ?No results for input(s): HGBA1C in the last 168 hours. ? ?Urine Drug Screen:  ?No results for input(s): LABOPIA, COCAINSCRNUR, LABBENZ, AMPHETMU, THCU, LABBARB in the last 168 hours. ?  ?Alcohol Level  ?No results for input(s): ETH in the last 168 hours. ? ? ?IMAGING past 24 hours ?No results found. ? ?PHYSICAL EXAM ?General:  on trach collar, obese middle-aged African-American patient in no acute distress ?Respiratory:  b/l rhonchi on trach collar ?Head is nontraumatic. Neck is supple. S/p PEG ?Neurological Exam :  ?No sedation.  Eyes are closed.  She does not respond to sternal rub and does not move extremities to noxious stimuli. Pupils 44mm bilaterally and nonreactive, corneal reflexes present,  oculocephalic reflex weak, cough and gag reflexes present.  Not following commands.  No spontaneous extremity movements noted, no significant movement on pain stimulation. Plantars both mute.  ? ?ASSESSMENT/PLAN ?[name] is a 51 y.o. female with history of HTN, TIA, ETOH abuse and medication noncompliance presenting after she was found by her family to be unresponsive in her car on 4/8.  She was moved to her couch, and when her family checked on her again on 4/9, she had not moved and EMS was called.  She was brought to the hospital and intubated for inability to protect her airways.  Workup for acute encephalopathy was started.  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  Multiple punctate acute ischemic infarcts were also seen in bilateral cerebral hemispheres.  CTA head and neck performed this morning shows severe bilateral P1 and right P2 stenoses as well as proximal left M2 stenosis and severe right ACA stenosis. ? ?Stroke: artery of Percheron infarcts and b/l hemisphere punctate embolic strokes, source unclear - ? APS ?CT head No acute abnormality.  ?CTA head & neck severe bilateral P1 and right P2 stenoses as well as proximal left M2 stenosis and severe right ACA stenosis, 40% stenosis of left ICA ?CTV no  dural venous sinus thrombosis ?MRI  Patchy restricted diffusion in bilateral thalami and midbrain with multiple punctate acute ischemic infarcts in bilateral cerebral hemispheres and multiple chronic microhemorrhages ?Repeat MRI 4/19 multiple infarcts in bilateral frontal and parietal lobes, possibly in watershed distribution ?2D Echo EF 55 to 60%.  Left atrium mildly dilated. 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 (Aspirin allergy) ?Therapy recommendations:  SNF ?Disposition:  pending ? ?Alcohol abuse ?Patient has a history of intermittent heavy alcohol use ?Thiamine supplement ?Off phenobarbital for alcohol withdraw  ? ?Respiratory failure ?Patient was intubated for inability to protect airway ?Ventilator management per CCM ?Difficulty being off ventilation ?S/p Trach 4/16, now on trach collar ? ?Fever and leukocytosis ?Tmax 101.6-> 101.1-> 100.6 ?WBC 11.6-> 17.5-> 13.5-> 13.8-> 14.7->12.6 ?Blood culture pending ?CXR consolidation versus atelectasis ? ?Hypertension ?Home meds:  none ?Stable ?Long-term BP goal normotensive ? ?Hyperlipidemia ?Home meds:  none ?LDL 132, goal < 70 ?Rosuvastatin 40 mg daily ?Continue statin on discharge ? ?Other Stroke Risk Factors ?Obesity, Body mass index is 30.27 kg/m?., BMI >/= 30 associated with increased stroke risk, recommend weight loss, diet and exercise as appropriate  ?Hx TIA ? ?Other Active Problems ?Suspected seizures ?Question of seizures prior arrival ?LTM EEG revealed no seizure ?On keppra 1000mg  PO ? ?Risk for ETOH withdrawal ?Patient has history of heavy alcohol use ?ETOH level <10 on admission ?Phenobarbital taper per CCM ? ?Dysphagia ?On tube feeding ?S/p PEG 4/17 ? ?Hypernatremia ?On free water, Na 149-147-152-154->153 ? ?Hospital day # 12 ? ?Neurology will follow peripherally. Please call with questions. Pt will follow up with stroke clinic NP at [facility] in about 4 weeks after discharge.  ? ? ?Rosalin Hawking, MD PhD ?Stroke Neurology ?02/01/2022 ?1:34 PM ? ? ?This patient is critically ill due to Multifocal embolic infarcts, recurrent strokes, alcohol abuse, respiratory failure, fever leukocytosis and at significant risk of neurological worsening, death form sepsis, septic shock, recurrent stroke, hemorrhagic transformation, status  epilepticus. This patient's care requires constant monitoring of vital signs, hemodynamics, respiratory and cardiac monitoring, review of multiple databases, neurological assessment, discussion with family, other specialists and medical decision making of high complexity. I spent 60 minutes of neurocritical care time in the care of this patient and her family. ? ? ? ? ?To contact Stroke Continuity provider, please refer to [email or web address] ?After hours, contact General Neurology  ?

## 2022-02-02 LAB — COMPREHENSIVE METABOLIC PANEL
ALT: 87 U/L — ABNORMAL HIGH (ref 0–44)
AST: 46 U/L — ABNORMAL HIGH (ref 15–41)
Albumin: 2.8 g/dL — ABNORMAL LOW (ref 3.5–5.0)
Alkaline Phosphatase: 61 U/L (ref 38–126)
Anion gap: 7 (ref 5–15)
BUN: 18 mg/dL (ref 6–20)
CO2: 27 mmol/L (ref 22–32)
Calcium: 9.1 mg/dL (ref 8.9–10.3)
Chloride: 114 mmol/L — ABNORMAL HIGH (ref 98–111)
Creatinine, Ser: 0.84 mg/dL (ref 0.44–1.00)
GFR, Estimated: >60 mL/min (ref >60)
Glucose, Bld: 124 mg/dL — ABNORMAL HIGH (ref 70–99)
Potassium: 5.2 mmol/L — ABNORMAL HIGH (ref 3.5–5.1)
Sodium: 148 mmol/L — ABNORMAL HIGH (ref 135–145)
Total Bilirubin: 0.2 mg/dL — ABNORMAL LOW (ref 0.3–1.2)
Total Protein: 6.9 g/dL (ref 6.5–8.1)

## 2022-02-02 LAB — CBC WITH DIFFERENTIAL/PLATELET
Abs Immature Granulocytes: 0.05 10*3/uL (ref 0.00–0.07)
Basophils Absolute: 0 10*3/uL (ref 0.0–0.1)
Basophils Relative: 0 %
Eosinophils Absolute: 0.3 10*3/uL (ref 0.0–0.5)
Eosinophils Relative: 3 %
HCT: 33.8 % — ABNORMAL LOW (ref 36.0–46.0)
Hemoglobin: 11 g/dL — ABNORMAL LOW (ref 12.0–15.0)
Immature Granulocytes: 0 %
Lymphocytes Relative: 17 %
Lymphs Abs: 1.9 10*3/uL (ref 0.7–4.0)
MCH: 27.4 pg (ref 26.0–34.0)
MCHC: 32.5 g/dL (ref 30.0–36.0)
MCV: 84.3 fL (ref 80.0–100.0)
Monocytes Absolute: 0.6 10*3/uL (ref 0.1–1.0)
Monocytes Relative: 5 %
Neutro Abs: 8.3 10*3/uL — ABNORMAL HIGH (ref 1.7–7.7)
Neutrophils Relative %: 75 %
Platelets: 238 10*3/uL (ref 150–400)
RBC: 4.01 MIL/uL (ref 3.87–5.11)
RDW: 14.6 % (ref 11.5–15.5)
WBC: 11.1 10*3/uL — ABNORMAL HIGH (ref 4.0–10.5)
nRBC: 0 % (ref 0.0–0.2)

## 2022-02-02 LAB — GLUCOSE, CAPILLARY
Glucose-Capillary: 156 mg/dL — ABNORMAL HIGH (ref 70–99)
Glucose-Capillary: 140 mg/dL — ABNORMAL HIGH (ref 70–99)
Glucose-Capillary: 175 mg/dL — ABNORMAL HIGH (ref 70–99)
Glucose-Capillary: 118 mg/dL — ABNORMAL HIGH (ref 70–99)
Glucose-Capillary: 133 mg/dL — ABNORMAL HIGH (ref 70–99)

## 2022-02-02 MED ORDER — LABETALOL HCL 5 MG/ML IV SOLN
10.0000 mg | INTRAVENOUS | Status: DC | PRN
  Administered 2022-02-06 – 2022-03-05 (×6): 10 mg via INTRAVENOUS
  Filled 2022-02-02 (×6): qty 4

## 2022-02-02 MED ORDER — METOPROLOL TARTRATE 5 MG/5ML IV SOLN
2.5000 mg | Freq: Four times a day (QID) | INTRAVENOUS | Status: DC | PRN
  Administered 2022-02-06 – 2022-03-05 (×3): 2.5 mg via INTRAVENOUS
  Filled 2022-02-02 (×3): qty 5

## 2022-02-02 NOTE — Progress Notes (Addendum)
? ?NAME:  Cindy Brooks, MRN:  [id number], DOB:  [birth year], LOS: 35 ?ADMISSION DATE:  01/20/2022, CONSULTATION DATE: 01/20/2022 ?REFERRING MD: Triad, CHIEF COMPLAINT: Altered mental status ? ?History of Present Illness:  ?51 year old female who was found outside in her car by family on 4/8 and was moved to the house and left for 5 hours at which time she did not move. Question if she had seizures therefore emergency medical services was activated.  She is transferred to [facility] emergency department near obtunded has extremely loud respirations copious secretions and difficulty controlling her airway.  She has been seen by neurology.  She admitted to the intensive care unit for further evaluation and treatment. ? ?Pertinent  Medical History  ?HTN  ?TIA ? 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 Trach placed.  ?4/18 PEG placed. Spiking fever. Lactate spiked. Got fluid. Cleared.  ?4/22 Tolerated 6hrs of ATC day prior. No major events overnight, appears that stroke had [place] meeting with family 4/21 with no decision made ? ?Interim History / Subjective:  ?Seen lying in bed on ATC only tolerated ATC for a few mins  ? ?Objective   ?Blood pressure 127/87, pulse 91, temperature 100.2 ?F (37.9 ?C), temperature source Oral, resp. rate 17, height 5\' 4"  (1.626 m), weight 81.5 kg, SpO2 100 %. ?   ?Vent Mode: PRVC ?FiO2 (%):  [40 %] 40 % ?Set Rate:  [12 bmp] 12 bmp ?Vt Set:  [440 mL] 440 mL ?PEEP:  [5 cmH20] 5 cmH20 ?Plateau Pressure:  [19 cmH20] 19 cmH20  ? ?Intake/Output Summary (Last 24 hours) at 02/02/2022 0702 ?Last data filed at 02/02/2022 0600 ?Gross per 24 hour  ?Intake 3011.53 ml  ?Output 950 ml  ?Net 2061.53 ml  ? ? ?Filed Weights  ? 01/31/22 0500 02/01/22 0446 02/02/22 0500  ?Weight: 77  kg 80 kg 81.5 kg  ? ? ?Examination: ?General: Acute on chronically ill appearing deconditioned middle aged female lying in bed on ATC, in NAD ?HEENT: 6 cuffed trach midline, MM pink/moist, PERRL,  ?Neuro: Eyes spontaneously open, unable to follow commands  ?CV: s1s2 regular rate and rhythm, no murmur, rubs, or gallops,  ?PULM:  Undergoing chest PT per bed with rhonchi auscultated, no increased work of breathing,  ?GI: soft, bowel sounds active in all 4 quadrants, non-tender, non-distended, tolerating TF ?Extremities: warm/dry, 1+ pitting lower extremity edema  ?Skin: no rashes or lesions ? ?Resolved Hospital Problem list   ?Urinary tract infection (completed treatment) ?Seizure  ?Hypertension  ?Mild AKI ? ?Assessment & Plan:  ? ?Acute midbrain/bilateral thalamic stroke c/b acute metabolic encephalopathy  ?-w/ possible Wernicke's given h/o ETOH abuse & reported seizure  ?Watershed infarct ?-exam worse ~ 4/17. Had been moving upper and stopped. MRI done 4/19 showed water shed distribution infarcts. Embolic source unclear perhaps hypoperfusion from low vs relative low BP. Last EEG neg. 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 ?P: ?Primary management per neurology  ?Maintain neuro protective measures ?Nutrition and bowel regiment  ?Seizure precautions  ?AEDs per neurology  ?Aspirations precautions  ?Continue Thiamine and Folate ?Continue secondary stroke prevention to include statin and plvix (allergy to ASA) ? ?Acute Hypoxemic Respiratory Failure ?-s/p trach 4/16 ?P: ?Continue daily attempts at [facility] with goal  of ATC as baseline  ?Wean PEEP and FiO2 for sats greater than 90%. ?Head of bed elevated 30 degrees ?Follow intermittent chest x-ray and ABG ?Ensure adequate pulmonary hygiene  ?Follow cultures  ?VAP bundle in place  ?PAD protocol ?Continue chest PT  ?Will need LTACH vs SNF at discharge  ? ?Fever/SIRS response ?-Acutely after  PEG>4/17. Resolved w/ IVFs. Wbc actually trending down (lactate cleared). Still tachycardic also a concern would be could she be withdrawing ? Subclinical sz? EEG neg 4/19. PCT neg. CXR with no explanation.  ?P: ?Continue to monitor off antibiotics  ?Trend CBC and fever curve  ? ?Fluid and electrolyte imbalance:  ?Hypernatremia ?Hyperchloremia ?Hyperkalemia  ?P: ?Improved with free water flushes, continue  ?Trend CBC  ? ?Dysphagia s/p CVA. Now has PEG ?P: ?Continue TF per PEG  ? ?Best Practice (right click and "Reselect all SmartList Selections" daily)  ?Diet/type: NPO with tube feeds ?DVT prophylaxis: LMWH ?GI prophylaxis: PPI ?Lines: N/A ?Foley:  N/A ?Code Status:  full code ?Last date of multidisciplinary goals of care discussion: ?Family updated at length 4/20. Both daughters on the phone with me were very upset at their mother's course. I assured them we are doing all we can do to provide her with a positive outcome, and unfortunately she has had additional infarcts. They stressed the need for better communication from the medical team. Updated by Dr. Erlinda Hong as well.  ?Essica Jaiswal (daughter) updated over the phone 4/22. All questions answered. She confirmed she would update all other family members today  ? ? ?Critical care time: 37 mins  ?Tifanie Gardiner D. Harris, NP-C ?[facility] ?Personal contact information can be found on Amion  ?02/02/2022, 7:06 AM ? ? ? ? ? ? ? ? ? ? ?

## 2022-02-03 LAB — GLUCOSE, CAPILLARY
Glucose-Capillary: 113 mg/dL — ABNORMAL HIGH (ref 70–99)
Glucose-Capillary: 146 mg/dL — ABNORMAL HIGH (ref 70–99)
Glucose-Capillary: 149 mg/dL — ABNORMAL HIGH (ref 70–99)
Glucose-Capillary: 151 mg/dL — ABNORMAL HIGH (ref 70–99)
Glucose-Capillary: 176 mg/dL — ABNORMAL HIGH (ref 70–99)
Glucose-Capillary: 180 mg/dL — ABNORMAL HIGH (ref 70–99)
Glucose-Capillary: 190 mg/dL — ABNORMAL HIGH (ref 70–99)

## 2022-02-03 LAB — BASIC METABOLIC PANEL
Anion gap: 7 (ref 5–15)
BUN: 18 mg/dL (ref 6–20)
CO2: 29 mmol/L (ref 22–32)
Calcium: 9.2 mg/dL (ref 8.9–10.3)
Chloride: 109 mmol/L (ref 98–111)
Creatinine, Ser: 0.79 mg/dL (ref 0.44–1.00)
GFR, Estimated: >60 mL/min (ref >60)
Glucose, Bld: 155 mg/dL — ABNORMAL HIGH (ref 70–99)
Potassium: 4.4 mmol/L (ref 3.5–5.1)
Sodium: 145 mmol/L (ref 135–145)

## 2022-02-03 LAB — CBC
HCT: 34.2 % — ABNORMAL LOW (ref 36.0–46.0)
Hemoglobin: 10.9 g/dL — ABNORMAL LOW (ref 12.0–15.0)
MCH: 26.7 pg (ref 26.0–34.0)
MCHC: 31.9 g/dL (ref 30.0–36.0)
MCV: 83.6 fL (ref 80.0–100.0)
Platelets: 263 10*3/uL (ref 150–400)
RBC: 4.09 MIL/uL (ref 3.87–5.11)
RDW: 14.4 % (ref 11.5–15.5)
WBC: 9.9 10*3/uL (ref 4.0–10.5)
nRBC: 0 % (ref 0.0–0.2)

## 2022-02-03 MED ORDER — FUROSEMIDE 10 MG/ML IJ SOLN
40.0000 mg | Freq: Once | INTRAMUSCULAR | Status: AC
  Administered 2022-02-03: 40 mg via INTRAVENOUS
  Filled 2022-02-03: qty 4

## 2022-02-03 NOTE — Progress Notes (Signed)
? ?NAME:  Cindy Brooks, MRN:  [id number], DOB:  [birth year], LOS: 57 ?ADMISSION DATE:  01/20/2022, CONSULTATION DATE: 01/20/2022 ?REFERRING MD: Triad, CHIEF COMPLAINT: Altered mental status ? ?History of Present Illness:  ?51 year old female who was found outside in her car by family on 4/8 and was moved to the house and left for 5 hours at which time she did not move. Question if she had seizures therefore emergency medical services was activated.  She is transferred to [facility] emergency department near obtunded has extremely loud respirations copious secretions and difficulty controlling her airway.  She has been seen by neurology.  She admitted to the intensive care unit for further evaluation and treatment. ? ?Pertinent  Medical History  ?HTN  ?TIA ? 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 Trach placed.  ?4/18 PEG placed. Spiking fever. Lactate spiked. Got fluid. Cleared.  ?4/22 Tolerated 6hrs of ATC day prior. No major events overnight, appears that stroke had [place] meeting with family 4/21 with no decision made ?4/23 No issues overnight, on vent this am, remains unresponsive  ? ?Interim History / Subjective:  ?Unresponsive on vent  ? ?Objective   ?Blood pressure 120/83, pulse 94, temperature 99.6 ?F (37.6 ?C), temperature source Axillary, resp. rate 17, height 5\' 4"  (1.626 m), weight 81.5 kg, SpO2 99 %. ?   ?Vent Mode: PRVC ?FiO2 (%):  [40 %] 40 % ?Set Rate:  [12 bmp] 12 bmp ?Vt Set:  [0.44 mL-440 mL] 440 mL ?PEEP:  [5 cmH20] 5 cmH20 ?Plateau Pressure:  [11 cmH20-19 cmH20] 11 cmH20  ? ?Intake/Output Summary (Last 24 hours) at 02/03/2022 0708 ?Last data filed at 02/03/2022 0600 ?Gross per 24 hour  ?Intake 1200.02 ml  ?Output 1500 ml  ?Net -299.98 ml  ? ? ?Filed Weights  ?  01/31/22 0500 02/01/22 0446 02/02/22 0500  ?Weight: 77 kg 80 kg 81.5 kg  ? ? ?Examination: ?General: Acute ill appearing middle aged female lying in bed on mechanical ventilation, in NAD ?HEENT: 6 cuffed shiley trach midline , MM pink/moist, PERRL,  ?Neuro: Unresponsive on vent, flicker of movement to extremities with pain  ?CV: s1s2 regular rate and rhythm, no murmur, rubs, or gallops,  ?PULM:  Mechanical breath sounds bilaterally, tolerating vent, no increased work of breathing ?GI: soft, bowel sounds active in all 4 quadrants, non-tender, non-distended, tolerating TF ?Extremities: warm/dry, generalized edema  ?Skin: no rashes or lesions ? ?Resolved Hospital Problem list   ?Urinary tract infection (completed treatment) ?Seizure  ?Hypertension  ?Mild AKI ?Hypernatremia ?Hyperchloremia ? ?Assessment & Plan:  ? ?Acute midbrain/bilateral thalamic stroke c/b acute metabolic encephalopathy  ?-w/ possible Wernicke's given h/o ETOH abuse & reported seizure  ?Watershed infarct ?-exam worse ~ 4/17. Had been moving upper and stopped. MRI done 4/19 showed water shed distribution infarcts. Embolic source unclear perhaps hypoperfusion from low vs relative low BP. Last EEG neg. 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 ?P: ?Primary management per neurology  ?Maintain neuro protective measures ?Nutrition and bowel regiment  ?Seizure precautions  ?AEDs per neurology  ?Aspirations precautions  ?Continue Thiamine and Folate  ?Secondary stroke prevention to include Statin and pPavix (ASA allergy) ? ?Acute Hypoxemic Respiratory Failure ?-s/p trach 4/16 ?P: ?Continue daily attempts at ATC trials ?  Continue ventilator support with lung protective strategies  ?Wean PEEP and FiO2 for sats greater than 90%. ?Head of bed elevated 30 degrees. ?Plateau pressures less than 30 cm H20.  ?Follow intermittent chest x-ray and ABG.   ?Ensure adequate pulmonary hygiene   ?Follow cultures  ?VAP bundle in place  ?PAD protocol ?Will need LTACH vs SNF  ? ?Fever/SIRS response ?-Acutely after PEG>4/17. Resolved w/ IVFs. Wbc actually trending down (lactate cleared). Still tachycardic also a concern would be could she be withdrawing ? Subclinical sz? EEG neg 4/19. PCT neg. CXR with no explanation.  ?P: ?Fever and CBC curve continues to improve, trend   ?Continue to monitor off antibiotics  ? ?Fluid and electrolyte imbalance:  ?Hypernatremia ?Hyperchloremia ?Hyperkalemia  ?P: ?Continue free water flushes  ?Trend Bmet  ? ?Dysphagia s/p CVA. Now has PEG ?P: ?TF per PEG tube  ?  ? ?Best Practice (right click and "Reselect all SmartList Selections" daily)  ?Diet/type: NPO with tube feeds ?DVT prophylaxis: LMWH ?GI prophylaxis: PPI ?Lines: N/A ?Foley:  N/A ?Code Status:  full code ?Last date of multidisciplinary goals of care discussion: ?Family updated at length 4/20. Both daughters on the phone with me were very upset at their mother's course. I assured them we are doing all we can do to provide her with a positive outcome, and unfortunately she has had additional infarcts. They stressed the need for better communication from the medical team. Updated by Dr. Erlinda Hong as well.  ?No family at bedside this AM will call later this morning for daily update  ? ?Critical care time: 37 mins  ?Taunja Brickner D. Harris, NP-C ?[facility] ?Personal contact information can be found on Amion  ?02/03/2022, 7:08 AM ? ? ? ? ? ? ? ? ? ? ?

## 2022-02-04 ENCOUNTER — Inpatient Hospital Stay (HOSPITAL_COMMUNITY): Payer: No Typology Code available for payment source

## 2022-02-04 DIAGNOSIS — Z7189 Other specified counseling: Secondary | ICD-10-CM

## 2022-02-04 DIAGNOSIS — Z9911 Dependence on respirator [ventilator] status: Secondary | ICD-10-CM

## 2022-02-04 DIAGNOSIS — R4189 Other symptoms and signs involving cognitive functions and awareness: Secondary | ICD-10-CM

## 2022-02-04 DIAGNOSIS — Z515 Encounter for palliative care: Secondary | ICD-10-CM

## 2022-02-04 DIAGNOSIS — D6861 Antiphospholipid syndrome: Secondary | ICD-10-CM | POA: Diagnosis present

## 2022-02-04 LAB — COMPREHENSIVE METABOLIC PANEL
ALT: 86 U/L — ABNORMAL HIGH (ref 0–44)
AST: 43 U/L — ABNORMAL HIGH (ref 15–41)
Albumin: 2.7 g/dL — ABNORMAL LOW (ref 3.5–5.0)
Alkaline Phosphatase: 68 U/L (ref 38–126)
Anion gap: 10 (ref 5–15)
BUN: 28 mg/dL — ABNORMAL HIGH (ref 6–20)
CO2: 28 mmol/L (ref 22–32)
Calcium: 9.3 mg/dL (ref 8.9–10.3)
Chloride: 107 mmol/L (ref 98–111)
Creatinine, Ser: 1.07 mg/dL — ABNORMAL HIGH (ref 0.44–1.00)
GFR, Estimated: >60 mL/min (ref >60)
Glucose, Bld: 265 mg/dL — ABNORMAL HIGH (ref 70–99)
Potassium: 4.5 mmol/L (ref 3.5–5.1)
Sodium: 145 mmol/L (ref 135–145)
Total Bilirubin: 0.2 mg/dL — ABNORMAL LOW (ref 0.3–1.2)
Total Protein: 7.2 g/dL (ref 6.5–8.1)

## 2022-02-04 LAB — CBC WITH DIFFERENTIAL/PLATELET
Abs Immature Granulocytes: 0.05 10*3/uL (ref 0.00–0.07)
Basophils Absolute: 0 10*3/uL (ref 0.0–0.1)
Basophils Relative: 0 %
Eosinophils Absolute: 0.4 10*3/uL (ref 0.0–0.5)
Eosinophils Relative: 4 %
HCT: 33 % — ABNORMAL LOW (ref 36.0–46.0)
Hemoglobin: 10.6 g/dL — ABNORMAL LOW (ref 12.0–15.0)
Immature Granulocytes: 1 %
Lymphocytes Relative: 17 %
Lymphs Abs: 1.7 10*3/uL (ref 0.7–4.0)
MCH: 27 pg (ref 26.0–34.0)
MCHC: 32.1 g/dL (ref 30.0–36.0)
MCV: 84.2 fL (ref 80.0–100.0)
Monocytes Absolute: 0.7 10*3/uL (ref 0.1–1.0)
Monocytes Relative: 7 %
Neutro Abs: 6.9 10*3/uL (ref 1.7–7.7)
Neutrophils Relative %: 71 %
Platelets: 329 10*3/uL (ref 150–400)
RBC: 3.92 MIL/uL (ref 3.87–5.11)
RDW: 14.6 % (ref 11.5–15.5)
WBC: 9.7 10*3/uL (ref 4.0–10.5)
nRBC: 0 % (ref 0.0–0.2)

## 2022-02-04 LAB — GLUCOSE, CAPILLARY
Glucose-Capillary: 153 mg/dL — ABNORMAL HIGH (ref 70–99)
Glucose-Capillary: 146 mg/dL — ABNORMAL HIGH (ref 70–99)
Glucose-Capillary: 203 mg/dL — ABNORMAL HIGH (ref 70–99)
Glucose-Capillary: 177 mg/dL — ABNORMAL HIGH (ref 70–99)
Glucose-Capillary: 152 mg/dL — ABNORMAL HIGH (ref 70–99)
Glucose-Capillary: 234 mg/dL — ABNORMAL HIGH (ref 70–99)

## 2022-02-04 IMAGING — CT CT ANGIO CHEST
2 of 6 series · 18 of 36 positions shown · IV contrast (agent unspecified)
Comparison: None.

CLINICAL DATA: Evaluate for pulmonary embolus.

EXAM:
CT ANGIOGRAPHY CHEST WITH CONTRAST
TECHNIQUE: Multidetector CT imaging of the chest was performed using the
standard protocol during bolus administration of intravenous
contrast. Multiplanar CT image reconstructions and MIPs were
obtained to evaluate the vascular anatomy.

[Series 7: pe thins · axial · 0.77mm/px · z∈[+1078,+1317]mm · 17 of 382 slices shown]
[im 20/382  lung]
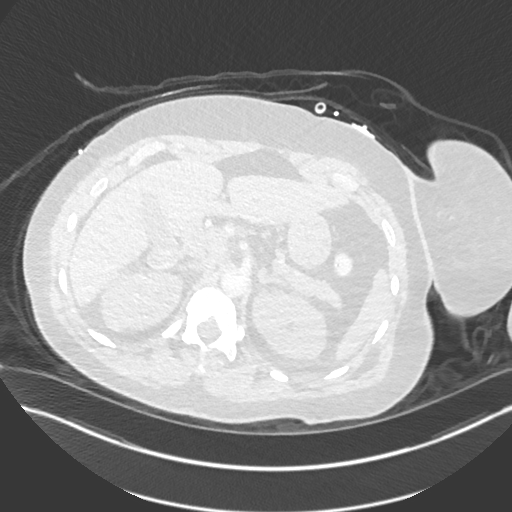
[im 39/382  mediastinal]
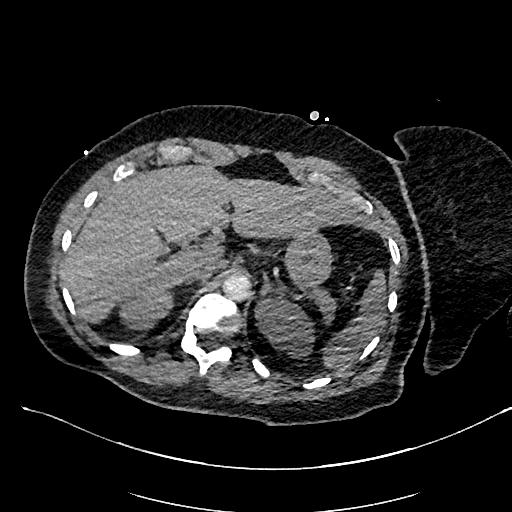
[im 58/382  lung]
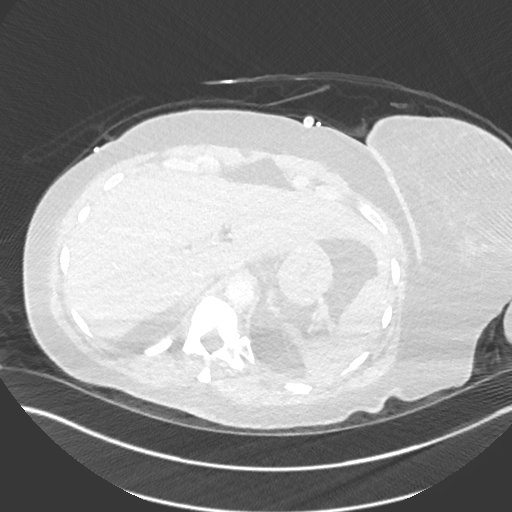
[im 77/382  mediastinal]
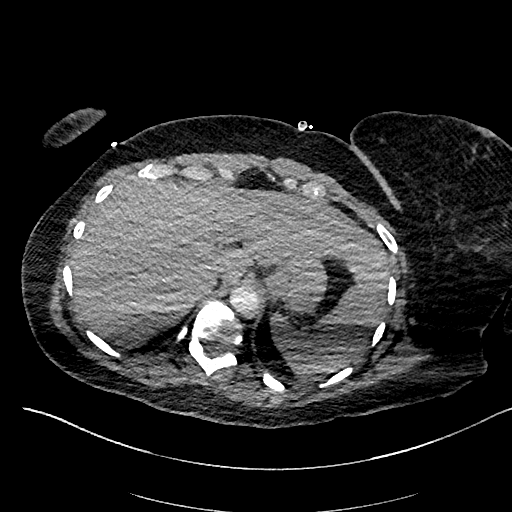
[im 115/382  lung]
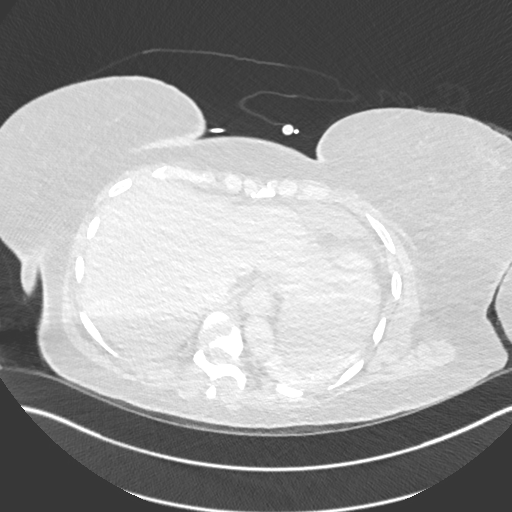
[im 134/382  mediastinal]
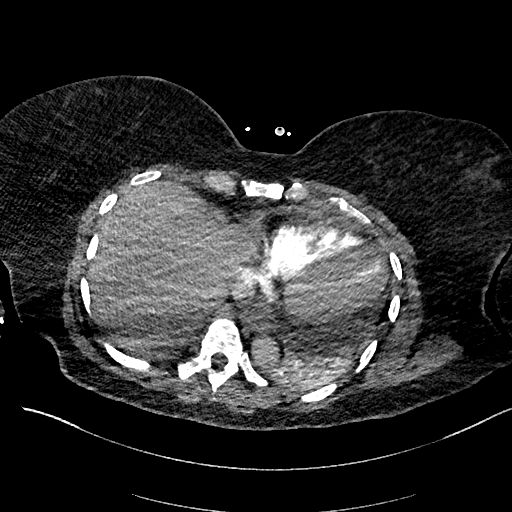
[im 153/382  lung]
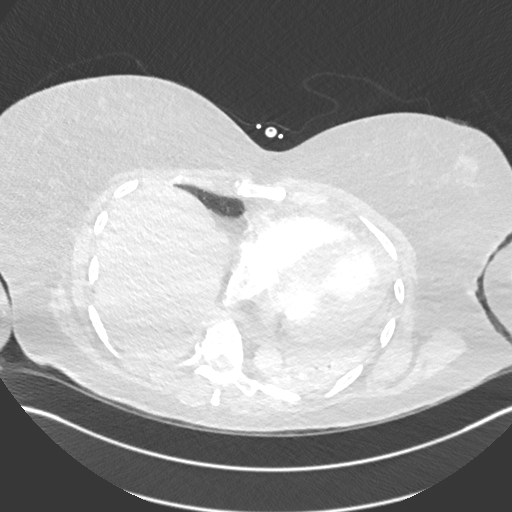
[im 172/382  mediastinal]
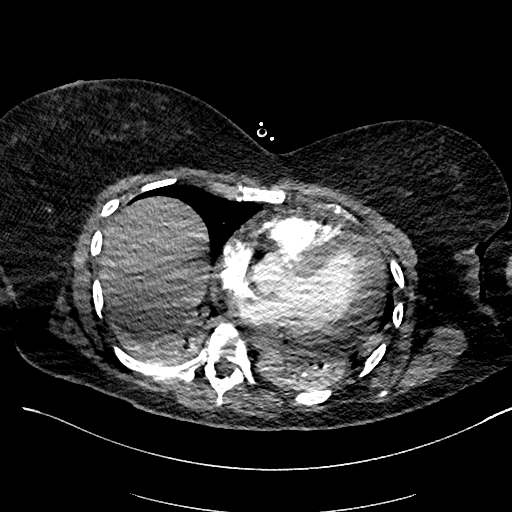
[im 191/382  lung]
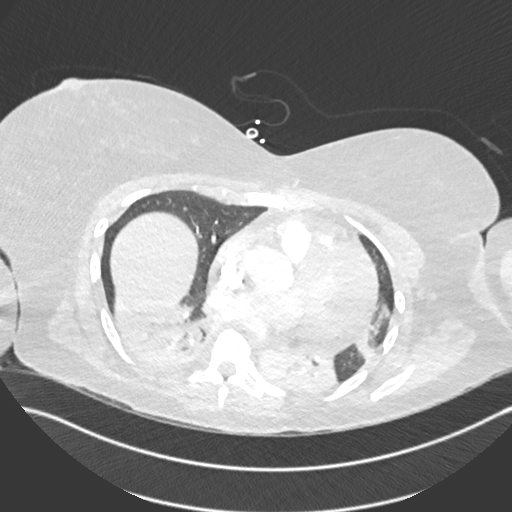
[im 210/382  mediastinal]
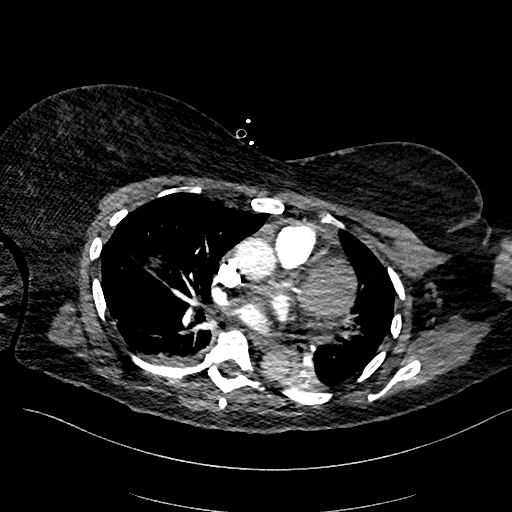
[im 229/382  lung]
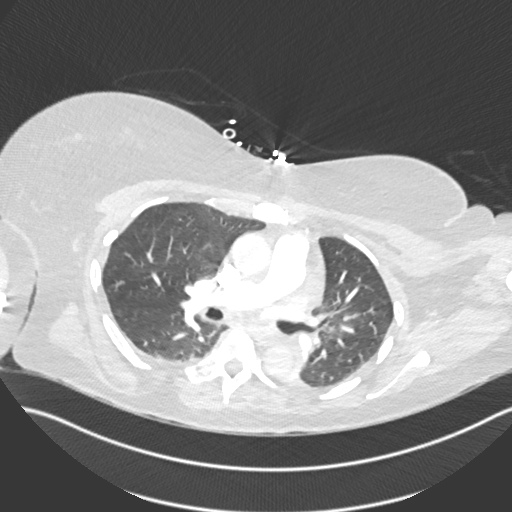
[im 248/382  mediastinal]
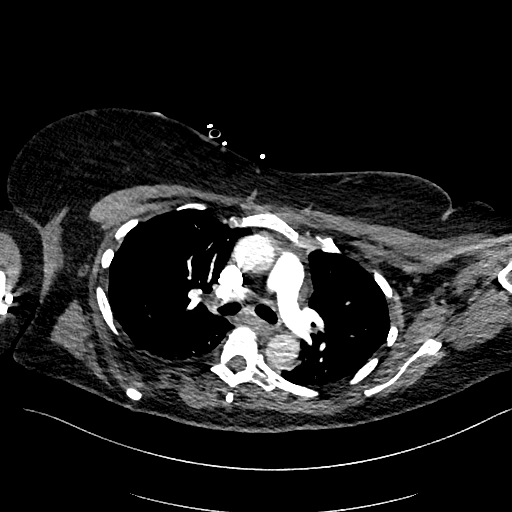
[im 267/382  lung]
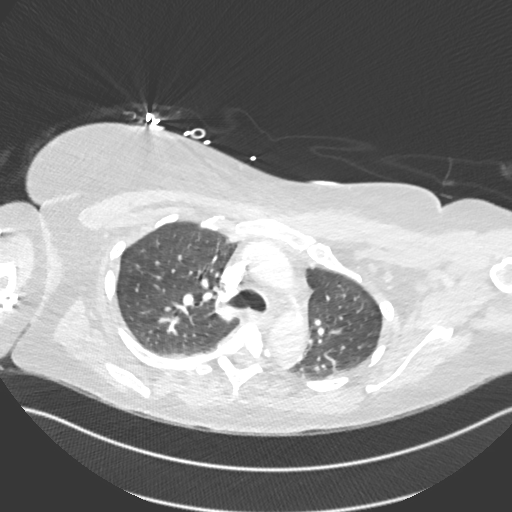
[im 305/382  mediastinal]
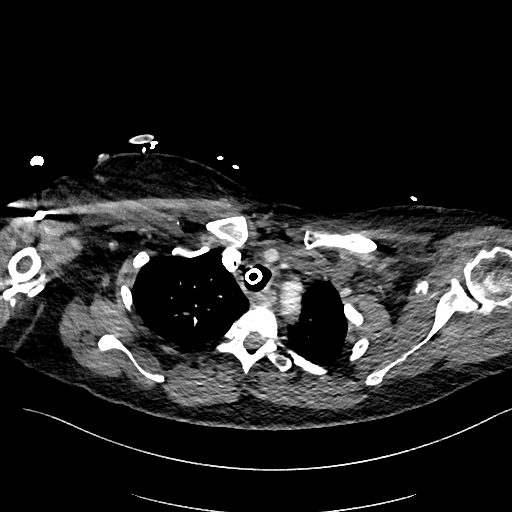
[im 324/382  lung]
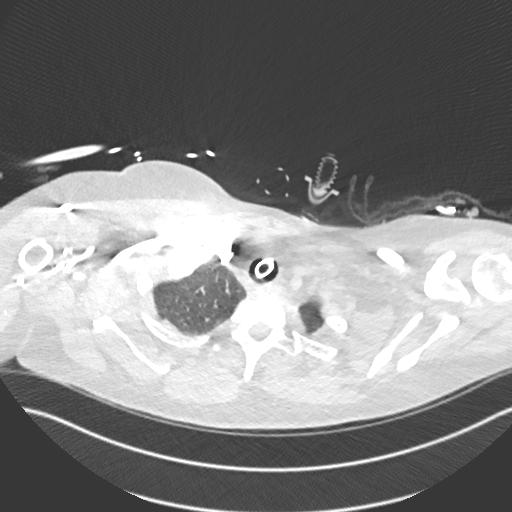
[im 343/382  mediastinal]
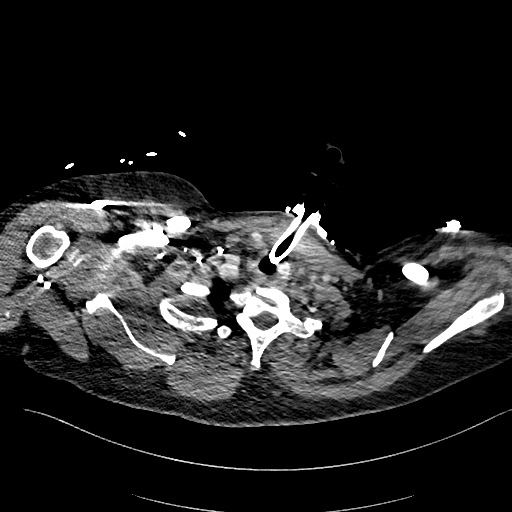
[im 362/382  lung]
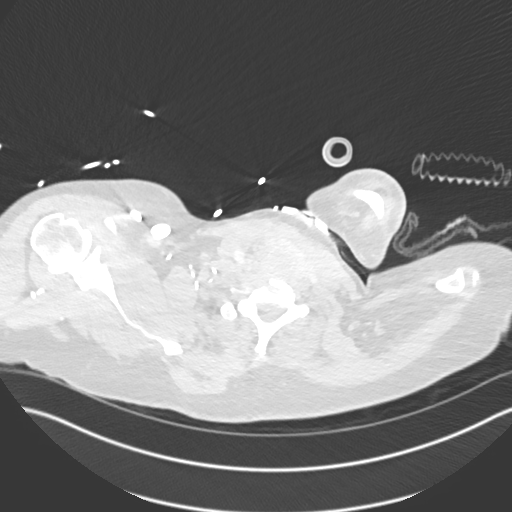

[Series 8: pe 2mm cor · coronal · 0.53mm/px · 1 of 146 slices shown]
[im 73/146  mediastinal]
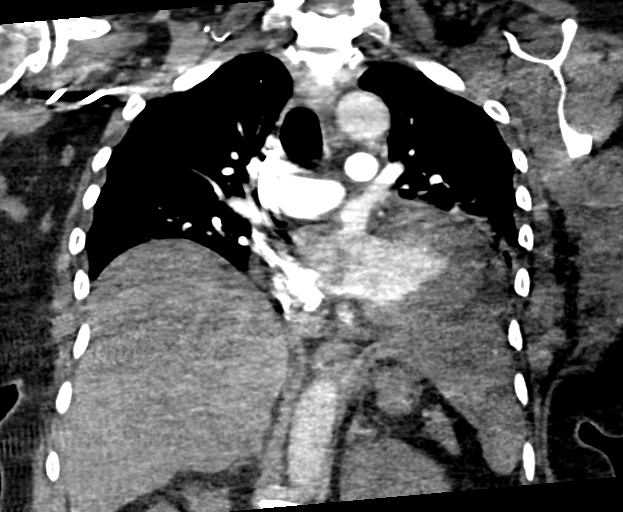

[18 of 36 positions shown; findings below may reference images not displayed]

RADIATION DOSE REDUCTION: This exam was performed according to the
departmental dose-optimization program which includes automated
exposure control, adjustment of the mA and/or kV according to
patient size and/or use of iterative reconstruction technique.

CONTRAST:  75mL OMNIPAQUE IOHEXOL 350 MG/ML SOLN
FINDINGS: Cardiovascular: Mild cardiac enlargement. No pericardial effusion.
Aortic atherosclerosis.

Satisfactory opacification of the pulmonary arteries to the
segmental level. No abnormal filling defects within the pulmonary
arteries to suggest an acute pulmonary embolus.

Mediastinum/Nodes: Tracheostomy tube is in place with tip above
carina. Thyroid gland and esophagus are unremarkable. No enlarged
lymph nodes.

Lungs/Pleura: No pleural effusion. There are decreased lung volumes.
Atelectasis and airspace consolidation is identified within both
lower lobes. Subsegmental atelectasis noted within the lingula.

Upper Abdomen: No acute abnormality.

Musculoskeletal: No chest wall abnormality. No acute or significant
osseous findings.

Review of the MIP images confirms the above findings.
IMPRESSION: 1. No evidence for acute pulmonary embolus.
2. Low lung volumes with atelectasis/consolidation in both lung
bases and inferior lingula
3. Tracheostomy tube in place.
4. Aortic Atherosclerosis ([0J]-[0J]).

## 2022-02-04 MED ORDER — SENNA 8.6 MG PO TABS
1.0000 | ORAL_TABLET | Freq: Every day | ORAL | Status: DC
  Administered 2022-02-04 – 2022-03-17 (×39): 8.6 mg
  Filled 2022-02-04 (×40): qty 1

## 2022-02-04 MED ORDER — IOHEXOL 350 MG/ML SOLN
100.0000 mL | Freq: Once | INTRAVENOUS | Status: AC | PRN
  Administered 2022-02-04: 75 mL via INTRAVENOUS

## 2022-02-04 NOTE — Progress Notes (Signed)
SLP Cancellation Note ? ?Patient Details ?Name: Cindy Brooks ?MRN: [id number] ?DOB: [birth year] ? ? ?Cancelled treatment:       Reason Eval/Treat Not Completed: Patient not medically ready ? ? ?Cristiana Yochim, Riley Nearing ?02/04/2022, 7:58 AM ?

## 2022-02-04 NOTE — Assessment & Plan Note (Addendum)
-  Well controlled on amlodipine and propranolol ?

## 2022-02-04 NOTE — Progress Notes (Addendum)
CSW following for discharge needs. Note palliative consult for GOC. If family decides to proceed with Vent SNF placement, CSW will work with family to understand likely need for out of state placement due to lack of insurance. [facility] is the only [facility] option that considers Medicaid pending patients.  ? ?CSW emailed [facility] to follow up on statis of Medicaid/Disability application.  ? ?Osborne Casco Ethylene Reznick ?LCSW, MSW, MHA ? ?

## 2022-02-04 NOTE — Assessment & Plan Note (Signed)
Tests performed 4/16 ?Anticardiolipin IGM positve. Lupus anticoagulant PPT prolonged. ? ?- Currently on Plavix and prophylactic Lovenox.  ?

## 2022-02-04 NOTE — Assessment & Plan Note (Addendum)
Bilateral thalamic, brainstem and diffuse watershed infarcts on MRI. Putative artery of Percheron infarct. ? ?- On Plavix and Crestor.  ?- Limited improvement after 15 days signals potentially poor prognosis, although AoP strokes often improved over time. May take several months.  ?- Added amantadine ?- Wean Keppra as may be contributing to somnolence. ? ?

## 2022-02-04 NOTE — Assessment & Plan Note (Signed)
Placed 4/17  ? ?- Continue usual PEG care.  ?

## 2022-02-04 NOTE — Assessment & Plan Note (Signed)
Labial injury ? ?- Wound care ?

## 2022-02-04 NOTE — Consult Note (Signed)
? ?                                                                                ?Consultation Note ?Date: 02/04/2022  ? ?Patient Name: Cindy Brooks  ?DOB: 01/27/1971  MRN: [id number]  Age / Sex: 51 y.o., female  ?PCP: Pcp, No ?Referring Physician: Kipp Brood, MD ? ?Reason for Consultation: Establishing goals of care and Psychosocial/spiritual support ? ?HPI/Patient Profile: 51 y.o. female  admitted on  01/20/2022 having been   found outside in her car by family on 4/8 and was moved to the house and left for 5 hours at which time she did not move, transported by EMS to [facility] emergency room, patient was obtunded with loud respirations, copious secretions, difficulty protecting her airway, was intubated ? ?Question if she had seizures therefore emergency medical services was activated.   ? ?Significant Hospital events ? ?4/9 Admit  ?     -CT head No acute abnormality.  ?      -CTA head & neck severe bilateral P1 and right P2 stenoses as well as proximal left  M2 stenosis and severe right ACA stenosis, 40% stenosis of left ICA ?     -MRI  Patchy restricted diffusion in bilateral thalami and midbrain with multiple     punctate acute ischemic infarcts in bilateral cerebral hemispheres and multiple chronic microhemorrhages ?      -Repeat MRI 4/19 multiple infarcts in bilateral frontal and parietal lobes, possibly in watershed distribution ? ? ?4/12 ongoing high dose thiamine for possible Wernike's encephalopathy  ?4/13 IV sedation stopped, phenobarbital wean ?4/17 still not waking up. Trach placed.  ?4/18 PEG placed. Spiking fever. Lactate spiked. Got fluid. Cleared.  ?4/22 Tolerated 6hrs of ATC day prior. No major events overnight, appears that stroke had [place] meeting with family 4/21 with no decision made ?4/23 No issues overnight, on vent this am/unsuccessful wean, remains unresponsive  ? ?Family face treatment option decisions, advanced directive decisions and anticipatory care needs. ? ? ?Clinical Assessment  and Goals of Care: ? ?This NP Wadie Lessen reviewed medical records, received report from team, assessed the patient and then meet at the patient's bedside and then spoke by phone with her 2 daughters; Rance Muir and Bess Kinds to discuss diagnosis, prognosis, GOC, EOL wishes disposition and options. ?  ?Concept of Palliative Care was introduced as specialized medical care for people and their families living with serious illness.  If focuses on providing relief from the symptoms and stress of a serious illness.  The goal is to improve quality of life for both the patient and the family. ? ?Values and goals of care important to patient and family were attempted to be elicited. ? ?Created space and opportunity for patient  and family to explore thoughts and feelings regarding current medical situation. ?Both daughters had lots of questions regarding their mother's treatment since admission.  They voice their need for ongoing updates from medical team.    ? ? ?Suggested and offered to coordinate family meetings, declined at this time.  I will continue to work with the family, helping them navigate healthcare decisions. ? ?Lengthy education offered on ophthalmic stroke with acute  encephalopathy.  Education offered on the very poor prognosis given the limited improvement over the past 15 days.  Education offered on the likely trajectory of a patient who is unresponsive and  vent dependent.   ? ?Emotional support offered.  ?  ?  ?A  discussion was had today regarding advanced directives.  Concepts specific to code status, artifical feeding and hydration, continued IV antibiotics and rehospitalization was had.   ?The difference between a aggressive medical intervention path  and a palliative comfort care path for this patient at this time was had.    ? ?Education offered on the difficult and limited options for transition of care when the patient is ventilator dependent. ? ?  ? Questions and concerns addressed.   Patient  encouraged to call with questions or concerns.   ?  ?PMT will continue to support holistically. ?  ?  ?  ?  ?No documented H POA or advanced care planning documents.  Patient has 4 children 3 who participate in decision-making. ? ?At this time patient is unable to make her own medical decisions, therefore the majority of patient's reasonably available adult children be making decisions for this patient. ? ?SUMMARY OF RECOMMENDATIONS   ? ?Code Status/Advance Care Planning: ?Full code ?Family is open to all offered and available medical interventions to prolong life.  They tell me they "need more time" before making any decision regarding level of care. ? ? ? ?Palliative Prophylaxis:  ?Aspiration, Bowel Regimen, and Frequent Pain Assessment ? ?Additional Recommendations (Limitations, Scope, Preferences): ?Full Scope Treatment ? ?Psycho-social/Spiritual:  ?Desire for further Chaplaincy support:yes, family would appreciate prayer for the patient at the bedside. ? ? ?Prognosis:  ?Unable to determine ? ?Discharge Planning: To Be Determined  ? ?  ? ?Primary Diagnoses: ?Present on Admission: ? Acute encephalopathy ? (Resolved) Hypertensive urgency ? 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 Respiratory failure ([place]) ? Hypertension ? 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 ? ?I have reviewed the medical record, interviewed the patient and family, and examined the patient. The following aspects are pertinent. ? ?Past Medical History:  ?Diagnosis Date  ? Hypertension   ? Hypertensive urgency 01/20/2022  ? Seizure ([place]) 01/28/2022  ? TIA (transient ischemic attack) 2019  ? Urinary tract infection 01/28/2022  ? ?Social History  ? ?Socioeconomic History  ? Marital status: Single  ?  Spouse name: Not on file  ? Number of children: Not on file  ? Years of education: Not on file  ? Highest education level: Not on file  ?Occupational History  ? Not on file  ?Tobacco Use  ? Smoking status: Never  ? Smokeless tobacco: Never   ?Substance and Sexual Activity  ? Alcohol use: Yes  ?  Alcohol/week: 203.0 standard drinks  ?  Types: 84 Cans of beer, 119 Shots of liquor per week  ?  Comment: 12pk/day and a fifth/day  ? Drug use: Never  ? Sexual activity: Not on file  ?Other Topics Concern  ? Not on file  ?Social History Narrative  ? Not on file  ? ?Social Determinants of Health  ? ?Financial Resource Strain: Not on file  ?Food Insecurity: Not on file  ?Transportation Needs: Not on file  ?Physical Activity: Not on file  ?Stress: Not on file  ?Social Connections: Not on file  ? ?Family History  ?Problem Relation Age of Onset  ? Diabetes Mellitus II Maternal Grandmother   ? ?Scheduled Meds: ? amLODipine  10 mg Per Tube Daily  ? chlorhexidine  gluconate (MEDLINE KIT)  15 mL Mouth Rinse BID  ? Chlorhexidine Gluconate Cloth  6 each Topical Daily  ? clopidogrel  75 mg Per Tube Daily  ? docusate  100 mg Per Tube BID  ? enoxaparin (LOVENOX) injection  40 mg Subcutaneous Q24H  ? feeding supplement (PROSource TF)  45 mL Per Tube Daily  ? folic acid  1 mg Per Tube Daily  ? free water  250 mL Per Tube Q4H  ? insulin aspart  0-15 Units Subcutaneous Q4H  ? levETIRAcetam  1,000 mg Per Tube BID  ? mouth rinse  15 mL Mouth Rinse 10 times per day  ? metoprolol tartrate  50 mg Per Tube BID  ? multivitamin with minerals  1 tablet Per Tube Daily  ? pantoprazole sodium  40 mg Per Tube Daily  ? polyethylene glycol  17 g Per Tube Daily  ? rosuvastatin  40 mg Per Tube Daily  ? senna  1 tablet Per Tube Daily  ? thiamine  100 mg Per Tube Daily  ? ?Continuous Infusions: ? sodium chloride Stopped (02/03/22 1247)  ? feeding supplement (VITAL 1.5 CAL) 1,000 mL (02/04/22 1242)  ? ?PRN Meds:.sodium chloride, acetaminophen (TYLENOL) oral liquid 160 mg/5 mL, docusate, fentaNYL (SUBLIMAZE) injection, labetalol, loperamide HCl, metoprolol tartrate, polyethylene glycol ?Medications Prior to Admission:  ?Prior to Admission medications   ?Not on File  ? ?Allergies  ?Allergen Reactions   ? Aspirin Hives and Rash  ? Codeine Hives and Rash  ? Penicillins Anaphylaxis, Swelling and Other (See Comments)  ? Strawberry Extract Anaphylaxis and Hives  ? ?Review of Systems  ?Unable to perform ROS: I

## 2022-02-04 NOTE — Progress Notes (Signed)
RT transported patient to CT and back with RN. No complications. RT will continue to monitor.  ?

## 2022-02-04 NOTE — Assessment & Plan Note (Addendum)
Failure to wean from ventilator despite relatively normal chest X-ray.  CT angiography negative for pulmonary embolism.  Moderate WBCs with polymicrobial flora on respiratory culture. ?Suspect obesity hypoventilation causing lung restriction. ? ?-Patient has variable respiratory effort irrespective of vent setting.  Trial of trach collar today.  Follow saturation. ?- Trial of diuresis to facilitate weaning. ?- Sputum cultures and empiric antibiotics.  Discontinue if cultures are negative. Otherwise complete 7days. ?- Trial of gabapentin for neurogenic cough.  ?

## 2022-02-04 NOTE — Progress Notes (Signed)
? ?NAME:  Cindy Brooks, MRN:  [id number], DOB:  11-16-1970, LOS: 15 ?ADMISSION DATE:  01/20/2022, CONSULTATION DATE: 01/20/2022 ?REFERRING MD: [facility], CHIEF COMPLAINT: Altered mental status ? ?History of Present Illness:  ?51 year old female who was found outside in her car by family on 4/8 and was moved to the house and left for 5 hours at which time she did not move. Question if she had seizures therefore emergency medical services was activated.  She is transferred to [facility] emergency department near obtunded has extremely loud respirations copious secretions and difficulty controlling her airway.  She has been seen by neurology.  She admitted to the intensive care unit for further evaluation and treatment. ? ?Pertinent  Medical History  ?HTN  ?TIA ? 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 Trach placed.  ?4/18 PEG placed. Spiking fever. Lactate spiked. Got fluid. Cleared.  ?4/22 Tolerated 6hrs of ATC day prior. No major events overnight, appears that stroke had [place] meeting with family 4/21 with no decision made ?4/23 No issues overnight, on vent this am, remains unresponsive  ? ?Interim History / Subjective:  ?Remains unresponsive.  ? ?Objective   ?Blood pressure 135/73, pulse (!) 108, temperature 99.9 ?F (37.7 ?C), temperature source Oral, resp. rate (!) 27, height 5\' 4"  (1.626 m), weight 81.5 kg, SpO2 91 %. ?   ?Vent Mode: PRVC ?FiO2 (%):  [40 %] 40 % ?Set Rate:  [12 bmp] 12 bmp ?Vt Set:  [440 mL] 440 mL ?PEEP:  [5 cmH20] 5 cmH20 ?Plateau Pressure:  [17 cmH20-19 cmH20] 19 cmH20  ? ?Intake/Output Summary (Last 24 hours) at 02/04/2022 0748 ?Last data filed at 02/04/2022 0600 ?Gross per 24 hour  ?Intake 1590.46 ml  ?Output 2050 ml  ?Net -459.54 ml  ? ? ?Filed Weights  ? 01/31/22  0500 02/01/22 0446 02/02/22 0500  ?Weight: 77 kg 80 kg 81.5 kg  ? ? ?Examination: ?General: Acute ill appearing middle aged female lying in bed on mechanical ventilation, in NAD ?HEENT: 6 cuffed shiley trach midline , MM pink/moist,  ?Neuro: Unresponsive on vent, no response to painful stimuli, strong cough. Pupils are dilated and unreactive.  ?CV: s1s2 regular rate and rhythm, no murmur, rubs, or gallops,  ?PULM:  Clear bilaterally. Rapid shallow breathing on 10/5 PSV ?GI: soft, bowel sounds active in all 4 quadrants, non-tender, non-distended, tolerating TF ?Extremities: warm/dry, generalized edema  ?Skin: no rashes or lesions ? ?Ancillary tests personally reviewed  ?Normal electrolytes. ?CXR shows low volume with atelectasis.  ?Echo normal LV/RV function.  ? ?Assessment & Plan:  ? ? 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 Putative artery of Percheron infarct. ? ?- On Plavix and Crestor.  ?- Limited improvement after 15 days signals poor prognosis.  ? ?Acute encephalopathy ?Related to multifocal CVA's and possible Wernicke's encephalopathy.  ? ?- Limited improvement after 15 days portends very poor prognosis especially given presence of brainstem findings.  ?- Palliative care consultation as patient is likely to be confined to long-term trach/vent facility.  ? ?Respiratory failure ([place]) ?Failure to wean from ventilator despite relatively normal chest X-ray. ? ?- CT angio to rule out pulmonary embolism as cause of failure ?- Continue chest PT for secretion management.  ?- Daily SBT and trach collar trials if possible.  ?-  Likely slow ventilator wean, will require LTACH ? ?Tracheostomy dependence ([place]) ?Tracheostomy placed 4/16.  ? ?- Routine trach care. Change tracheostomy q7days  ?- Continue with cuffed tracheostomy for mechanical ventilation.  ? ?PEG (percutaneous endoscopic gastrostomy) status ([place]) ?Placed 4/17  ? ?- Continue usual PEG care.   ? ?Antiphospholipid antibody with hypercoagulable state ([place]) ?Tests performed 4/16 ?Anticardiolipin IGM positve. Lupus anticoagulant PPT prolonged. ? ?- Currently on Plavix and prophylactic Lovenox.  ? ?Hypertension ?-Well controlled on amlodipine and metoprolol ? ?Pressure injury of skin ?Labial injury ? ?- Wound care  ? ?Best Practice (right click and "Reselect all SmartList Selections" daily)  ?Diet/type: NPO with tube feeds ?DVT prophylaxis: LMWH ?GI prophylaxis: PPI ?Lines: N/A ?Foley:  N/A ?Code Status:  full code ?Last date of multidisciplinary goals of care discussion: ?Family updated at length 4/20.  ? ?CRITICAL CARE ?Performed by: Kipp Brood ? ? ?Total critical care time: 40 minutes ? ?Critical care time was exclusive of separately billable procedures and treating other patients. ? ?Critical care was necessary to treat or prevent imminent or life-threatening deterioration. ? ?Critical care was time spent personally by me on the following activities: development of treatment plan with patient and/or surrogate as well as nursing, discussions with consultants, evaluation of patient's response to treatment, examination of patient, obtaining history from patient or surrogate, ordering and performing treatments and interventions, ordering and review of laboratory studies, ordering and review of radiographic studies, pulse oximetry, re-evaluation of patient's condition and participation in multidisciplinary rounds. ? ?Kipp Brood, MD FRCPC ?ICU Physician ?[facility]  ?Pager: [phone] ?Mobile: [phone] ?After hours: [phone]. ? ? ?02/04/2022, 7:48 AM ? ? ? ? ? ? ? ? ? ? ?

## 2022-02-04 NOTE — Assessment & Plan Note (Addendum)
Related to multifocal CVA's and possible Wernicke's encephalopathy.  ? ?- Limited improvement after 15 days portends very poor prognosis especially given presence of brainstem findings.  ?- Palliative care consultation as patient is likely to be confined to long-term trach/vent facility.  ?- Family still requesting full scope of care.  ?- Have added amantadine ?

## 2022-02-04 NOTE — Assessment & Plan Note (Addendum)
Tracheostomy placed 4/16.  ? ?- Routine trach care. Change tracheostomy q7days  ?- Continue with cuffed tracheostomy for mechanical ventilation if necessary ?

## 2022-02-05 LAB — CULTURE, BLOOD (ROUTINE X 2)
Culture: NO GROWTH
Culture: NO GROWTH
Special Requests: ADEQUATE
Special Requests: ADEQUATE
Specimen Description: ADEQUATE

## 2022-02-05 LAB — GLUCOSE, CAPILLARY
Glucose-Capillary: 160 mg/dL — ABNORMAL HIGH (ref 70–99)
Glucose-Capillary: 165 mg/dL — ABNORMAL HIGH (ref 70–99)
Glucose-Capillary: 232 mg/dL — ABNORMAL HIGH (ref 70–99)
Glucose-Capillary: 175 mg/dL — ABNORMAL HIGH (ref 70–99)
Glucose-Capillary: 170 mg/dL — ABNORMAL HIGH (ref 70–99)
Glucose-Capillary: 177 mg/dL — ABNORMAL HIGH (ref 70–99)

## 2022-02-05 MED ORDER — INSULIN ASPART 100 UNIT/ML IJ SOLN
4.0000 [IU] | INTRAMUSCULAR | Status: DC
  Administered 2022-02-05 – 2022-03-06 (×163): 4 [IU] via SUBCUTANEOUS

## 2022-02-05 MED ORDER — POLYETHYLENE GLYCOL 3350 17 G PO PACK
17.0000 g | PACK | Freq: Two times a day (BID) | ORAL | Status: DC
  Administered 2022-02-06 – 2022-02-08 (×5): 17 g
  Filled 2022-02-05 (×5): qty 1

## 2022-02-05 NOTE — Progress Notes (Signed)
Spoke with patient's daughter, Caryn Bee, on the phone at this time and gave her updates. All questions answered. ? ?Sherral Hammers RN ?

## 2022-02-05 NOTE — Progress Notes (Signed)
This chaplain responded to PMT consult for spiritual care. The chaplain understands the Pt. family requested a chaplain bedside presence for prayer. ? ?The chaplain joined the Pt. RN-Caroline and EVS in the Pt. room. The chaplain understands the Pt. is a [place]. With a quiet voice the chaplain introduced herself to the Pt. and reflected on God's love and companionship. The chaplain ended the visit with prayer. ? ?Chaplain Stephanie Acre ?[phone] ?

## 2022-02-05 NOTE — Progress Notes (Signed)
? ?NAME:  Cindy Brooks, MRN:  [id number], DOB:  1971/01/16, LOS: 12 ?ADMISSION DATE:  01/20/2022, CONSULTATION DATE: 01/20/2022 ?REFERRING MD: Triad, CHIEF COMPLAINT: Altered mental status ? ?History of Present Illness:  ?51 year old female who was found outside in her car by family on 4/8 and was moved to the house and left for 5 hours at which time she did not move. Question if she had seizures therefore emergency medical services was activated.  She is transferred to [facility] emergency department near obtunded has extremely loud respirations copious secretions and difficulty controlling her airway.  She has been seen by neurology.  She admitted to the intensive care unit for further evaluation and treatment. ? ?Pertinent  Medical History  ?HTN  ?TIA ? 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 Trach placed.  ?4/18 PEG placed. Spiking fever. Lactate spiked. Got fluid. Cleared.  ?4/22 Tolerated 6hrs of ATC day prior. No major events overnight, appears that stroke had [place] meeting with family 4/21 with no decision made ?4/23 No issues overnight, on vent this am, remains unresponsive  ? ?Interim History / Subjective:  ?Remains unresponsive. No changes. ? ?Objective   ?Blood pressure (!) 128/94, pulse 100, temperature 99.7 ?F (37.6 ?C), temperature source Axillary, resp. rate 13, height 5\' 4"  (1.626 m), weight 81.5 kg, SpO2 98 %. ?   ?Vent Mode: PRVC ?FiO2 (%):  [35 %-40 %] 40 % ?Set Rate:  [12 bmp] 12 bmp ?Vt Set:  [440 mL] 440 mL ?PEEP:  [5 cmH20] 5 cmH20 ?Plateau Pressure:  [15 cmH20-20 cmH20] 18 cmH20  ? ?Intake/Output Summary (Last 24 hours) at 02/05/2022 1008 ?Last data filed at 02/05/2022 1000 ?Gross per 24 hour  ?Intake 2070 ml  ?Output 2550 ml  ?Net -480 ml  ? ? ?Filed Weights   ? 01/31/22 0500 02/01/22 0446 02/02/22 0500  ?Weight: 77 kg 80 kg 81.5 kg  ? ? ?Examination: ?General: Adult middle aged female lying in bed on mechanical ventilation, in NAD ?HEENT: 6 cuffed shiley trach midline , MM pink/moist,  ?Neuro: Unresponsive on vent, withdraws to painful stimuli. Pupils are dilated and unreactive.  ?CV: RRR, no M/R/G ?PULM:  Clear bilaterally. Vent supported breaths. ?GI: soft, bowel sounds active in all 4 quadrants, non-tender, non-distended, tolerating TF. ?Extremities: warm/dry, generalized edema.  ?Skin: no rashes or lesions. ? ?Ancillary tests personally reviewed  ?Normal electrolytes. ?CXR shows low volume with atelectasis.  ?Echo normal LV/RV function.  ? ?Assessment & Plan:  ? 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 Putative artery of Percheron infarct. Limited improvement after 15 days portends very poor prognosis especially given presence of brainstem findings.  ?- Continue Plavix, Keppra, Crestor. ?- Neuro outpatient follow up if has meaningful recovery. ?  ?Respiratory failure ([place]) with failure to wean from ventilator - s/p trach 01/27/22. CTA chest 4/24 neg for PE. ?Tracheostomy dependence ([place]) ?- Continue vent support. ?- Daily weaning. ?- Routine trach care. Change tracheostomy q7days.  ?- Bronchial hygiene. ?- Will likely require LTACH / vent SNF. ?  ?Dysphagia s/p PEG 01/28/22. ?- Continue usual PEG care.  ?- Continue tube feeds. ?  ?Antiphospholipid antibody with hypercoagulable state ([place]) - Tests performed 4/16, Anticardiolipin IGM positve. Lupus anticoagulant PPT prolonged. ?- Currently on Plavix and prophylactic  Lovenox.  ?- Repeat labs in 12 weeks to confirm. ?  ?Hypertension ?- Continue amlodipine and metoprolol. ?  ?Pressure injury of skin ?Labial injury ?- Wound care. ? ?Hyperglycemia. ?- Continue SSI. ?  ? ?Best Practice (right click and "Reselect all SmartList Selections" daily)   ?Diet/type: NPO with tube feeds ?DVT prophylaxis: LMWH ?GI prophylaxis: PPI ?Lines: N/A ?Foley:  N/A ?Code Status:  full code ?Last date of multidisciplinary goals of care discussion: ?Family updated at length 4/20.  ? ?CC time: 30 min. ? ?Montey Hora, PA - C ?[facility] ?For pager details, please see AMION or use Epic chat  ?After 1900, please call [facility] for cross coverage needs ?02/05/2022, 10:28 AM ? ? ? ? ? ? ? ? ?

## 2022-02-06 LAB — GLUCOSE, CAPILLARY
Glucose-Capillary: 143 mg/dL — ABNORMAL HIGH (ref 70–99)
Glucose-Capillary: 156 mg/dL — ABNORMAL HIGH (ref 70–99)
Glucose-Capillary: 267 mg/dL — ABNORMAL HIGH (ref 70–99)
Glucose-Capillary: 185 mg/dL — ABNORMAL HIGH (ref 70–99)
Glucose-Capillary: 178 mg/dL — ABNORMAL HIGH (ref 70–99)
Glucose-Capillary: 236 mg/dL — ABNORMAL HIGH (ref 70–99)

## 2022-02-06 MED ORDER — FUROSEMIDE 10 MG/ML IJ SOLN
40.0000 mg | Freq: Once | INTRAMUSCULAR | Status: AC
Start: 2022-02-06 — End: 2022-02-06
  Administered 2022-02-06: 40 mg via INTRAVENOUS
  Filled 2022-02-06: qty 4

## 2022-02-06 NOTE — Progress Notes (Signed)
? ?NAME:  Cindy Brooks, MRN:  [id number], DOB:  1971/04/26, LOS: 58 ?ADMISSION DATE:  01/20/2022, CONSULTATION DATE: 01/20/2022 ?REFERRING MD: Triad, CHIEF COMPLAINT: Altered mental status ? ?History of Present Illness:  ?51 year old female who was found outside in her car by family on 4/8 and was moved to the house and left for 5 hours at which time she did not move. Question if she had seizures therefore emergency medical services was activated.  She is transferred to [facility] emergency department near obtunded has extremely loud respirations copious secretions and difficulty controlling her airway.  She has been seen by neurology.  She admitted to the intensive care unit for further evaluation and treatment. ? ?Pertinent  Medical History  ?HTN  ?TIA ? 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 Trach placed.  ?4/18 PEG placed. Spiking fever. Lactate spiked. Got fluid. Cleared.  ?4/22 Tolerated 6hrs of ATC day prior. No major events overnight, appears that stroke had [place] meeting with family 4/21 with no decision made ?4/23 No issues overnight, on vent this am, remains unresponsive  ? ?Interim History / Subjective:  ?Remains unresponsive. No changes. ? ?Objective   ?Blood pressure (!) 134/95, pulse (!) 104, temperature 99 ?F (37.2 ?C), temperature source Axillary, resp. rate (!) 22, height 5\' 4"  (1.626 m), weight 77.6 kg, SpO2 96 %. ?   ?Vent Mode: PSV;CPAP ?FiO2 (%):  [40 %] 40 % ?Set Rate:  [12 bmp] 12 bmp ?Vt Set:  [440 mL] 440 mL ?PEEP:  [5 cmH20] 5 cmH20 ?Pressure Support:  [12 cmH20] 12 cmH20 ?Plateau Pressure:  [14 cmH20-19 cmH20] 17 cmH20  ? ?Intake/Output Summary (Last 24 hours) at 02/06/2022 0926 ?Last data filed at 02/06/2022 0600 ?Gross per 24 hour  ?Intake 2789.92 ml   ?Output 2150 ml  ?Net 639.92 ml  ? ? ?Filed Weights  ? 02/01/22 0446 02/02/22 0500 02/06/22 0500  ?Weight: 80 kg 81.5 kg 77.6 kg  ? ? ?Examination: ?General: Adult middle aged female lying in bed on mechanical ventilation, in NAD ?HEENT: 6 cuffed shiley trach midline , MM pink/moist,  ?Neuro: Unresponsive on vent, withdraws to painful stimuli. Pupils are dilated and unreactive.  ?CV: RRR, no M/R/G ?PULM:  Clear bilaterally. Vent supported breaths. Tachycardic on PSV with inadequate tidal volumes with PS less than 12.  ?GI: soft, bowel sounds active in all 4 quadrants, non-tender, non-distended, tolerating TF. ?Extremities: warm/dry, generalized edema.  ?Skin: no rashes or lesions. ? ?Ancillary tests personally reviewed  ?Normal electrolytes. ?CXR shows low volume with atelectasis.  ?Echo normal LV/RV function.  ? ?Assessment & Plan:  ? 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 Putative artery of Percheron infarct. ? ?- On Plavix and Crestor.  ?- Limited improvement after 15 days signals poor prognosis.  ? ?Acute encephalopathy ?Related to multifocal CVA's and possible Wernicke's encephalopathy.  ? ?- Limited improvement after 15 days portends very poor prognosis especially given presence of brainstem findings.  ?- Palliative care consultation as patient is likely to be confined to long-term trach/vent facility.  ?- Family still requesting full scope of care.  ? ?Respiratory failure ([place]) ?Failure to wean from ventilator despite relatively normal chest X-ray.  CT angiography negative for pulmonary embolism. ?Suspect obesity hypoventilation causing lung restriction. ? ?- Continue chest PT  for secretion management.  ?- Daily SBT and trach collar trials if possible.  ?- Likely slow ventilator wean, will require LTACH ?- Trial of diuresis to facilitate weaning. ? ?Tracheostomy dependence ([place]) ?Tracheostomy placed 4/16.  ? ?- Routine trach care. Change tracheostomy  q7days  ?- Continue with cuffed tracheostomy for mechanical ventilation.  ? ?PEG (percutaneous endoscopic gastrostomy) status ([place]) ?Placed 4/17  ? ?- Continue usual PEG care.  ? ?Antiphospholipid antibody with hypercoagulable state ([place]) ?Tests performed 4/16 ?Anticardiolipin IGM positve. Lupus anticoagulant PPT prolonged. ? ?- Currently on Plavix and prophylactic Lovenox.  ? ?Hypertension ?-Well controlled on amlodipine and metoprolol ? ?Pressure injury of skin ?Labial injury ? ?- Wound care ? ? ?Best Practice (right click and "Reselect all SmartList Selections" daily)  ?Diet/type: NPO with tube feeds ?DVT prophylaxis: LMWH ?GI prophylaxis: PPI ?Lines: N/A ?Foley:  N/A ?Code Status:  full code ?Last date of multidisciplinary goals of care discussion: ?Family updated at length 4/20.  ? ?Kipp Brood, MD FRCPC ?ICU Physician ?[place]  ?Pager: [phone] ?Mobile: [phone] ?After hours: [phone]. ? ?02/06/2022, 9:26 AM ? ? ? ? ? ? ? ? ?

## 2022-02-06 NOTE — Progress Notes (Signed)
Pt placed back on full ventilator support for night rest. 

## 2022-02-07 ENCOUNTER — Inpatient Hospital Stay (HOSPITAL_COMMUNITY): Payer: No Typology Code available for payment source

## 2022-02-07 DIAGNOSIS — G9089 Other disorders of autonomic nervous system: Secondary | ICD-10-CM

## 2022-02-07 DIAGNOSIS — Z9911 Dependence on respirator [ventilator] status: Secondary | ICD-10-CM

## 2022-02-07 DIAGNOSIS — D72829 Elevated white blood cell count, unspecified: Secondary | ICD-10-CM

## 2022-02-07 DIAGNOSIS — G908 Other disorders of autonomic nervous system: Secondary | ICD-10-CM

## 2022-02-07 DIAGNOSIS — Z7189 Other specified counseling: Secondary | ICD-10-CM

## 2022-02-07 DIAGNOSIS — R509 Fever, unspecified: Secondary | ICD-10-CM

## 2022-02-07 DIAGNOSIS — E870 Hyperosmolality and hypernatremia: Secondary | ICD-10-CM

## 2022-02-07 DIAGNOSIS — R4020 Unspecified coma: Secondary | ICD-10-CM | POA: Diagnosis present

## 2022-02-07 LAB — CBC WITH DIFFERENTIAL/PLATELET
Abs Immature Granulocytes: 0.09 10*3/uL — ABNORMAL HIGH (ref 0.00–0.07)
Basophils Absolute: 0.1 10*3/uL (ref 0.0–0.1)
Basophils Relative: 1 %
Eosinophils Absolute: 0.4 10*3/uL (ref 0.0–0.5)
Eosinophils Relative: 3 %
HCT: 37.9 % (ref 36.0–46.0)
Hemoglobin: 11.7 g/dL — ABNORMAL LOW (ref 12.0–15.0)
Immature Granulocytes: 1 %
Lymphocytes Relative: 17 %
Lymphs Abs: 2.4 10*3/uL (ref 0.7–4.0)
MCH: 26 pg (ref 26.0–34.0)
MCHC: 30.9 g/dL (ref 30.0–36.0)
MCV: 84.2 fL (ref 80.0–100.0)
Monocytes Absolute: 1 10*3/uL (ref 0.1–1.0)
Monocytes Relative: 7 %
Neutro Abs: 10.3 10*3/uL — ABNORMAL HIGH (ref 1.7–7.7)
Neutrophils Relative %: 71 %
Platelets: 424 10*3/uL — ABNORMAL HIGH (ref 150–400)
RBC: 4.5 MIL/uL (ref 3.87–5.11)
RDW: 14.7 % (ref 11.5–15.5)
WBC: 14.3 10*3/uL — ABNORMAL HIGH (ref 4.0–10.5)
nRBC: 0 % (ref 0.0–0.2)

## 2022-02-07 LAB — GLUCOSE, CAPILLARY
Glucose-Capillary: 164 mg/dL — ABNORMAL HIGH (ref 70–99)
Glucose-Capillary: 153 mg/dL — ABNORMAL HIGH (ref 70–99)
Glucose-Capillary: 198 mg/dL — ABNORMAL HIGH (ref 70–99)
Glucose-Capillary: 155 mg/dL — ABNORMAL HIGH (ref 70–99)
Glucose-Capillary: 180 mg/dL — ABNORMAL HIGH (ref 70–99)
Glucose-Capillary: 162 mg/dL — ABNORMAL HIGH (ref 70–99)

## 2022-02-07 LAB — BASIC METABOLIC PANEL
Anion gap: 9 (ref 5–15)
BUN: 35 mg/dL — ABNORMAL HIGH (ref 6–20)
CO2: 31 mmol/L (ref 22–32)
Calcium: 9.8 mg/dL (ref 8.9–10.3)
Chloride: 111 mmol/L (ref 98–111)
Creatinine, Ser: 1.07 mg/dL — ABNORMAL HIGH (ref 0.44–1.00)
GFR, Estimated: >60 mL/min (ref >60)
Glucose, Bld: 152 mg/dL — ABNORMAL HIGH (ref 70–99)
Potassium: 4.5 mmol/L (ref 3.5–5.1)
Sodium: 151 mmol/L — ABNORMAL HIGH (ref 135–145)

## 2022-02-07 LAB — MAGNESIUM: Magnesium: 2.9 mg/dL — ABNORMAL HIGH (ref 1.7–2.4)

## 2022-02-07 IMAGING — DX DG CHEST 1V PORT
1 series · 1 of 1 positions shown · non-contrast
Comparison: CT chest [DATE]

CLINICAL DATA: Fever, pneumonia

EXAM:
PORTABLE CHEST 1 VIEW

[chest]
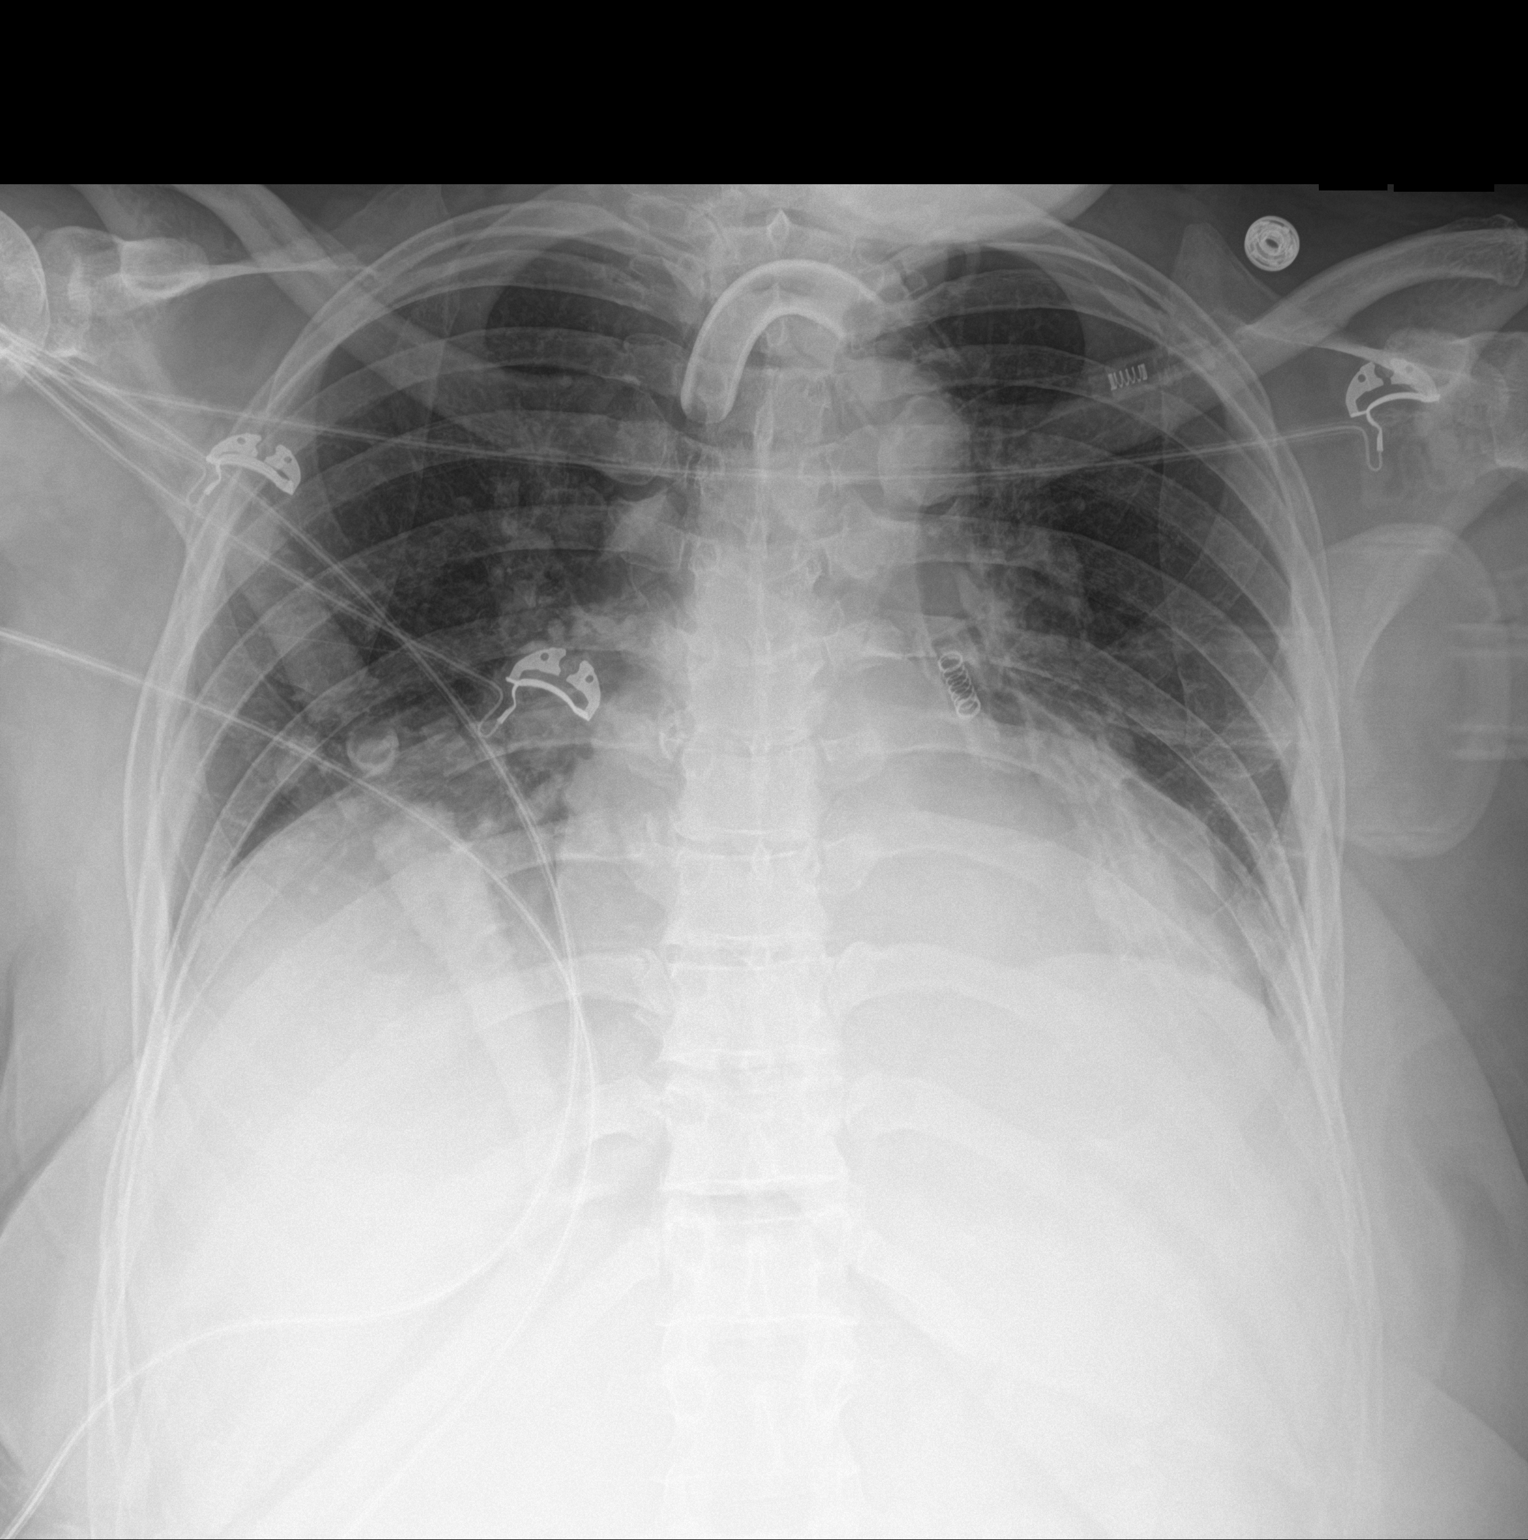

[1 of 1 positions shown; findings below may reference images not displayed]

FINDINGS: Low lung volumes. Bibasilar atelectasis. No focal consolidation,
pleural effusion or pneumothorax. Tracheostomy tube in satisfactory
position. Stable cardiomediastinal silhouette.
IMPRESSION: 1. Low lung volumes with bibasilar atelectasis.

## 2022-02-07 MED ORDER — JEVITY 1.5 CAL/FIBER PO LIQD
1000.0000 mL | ORAL | Status: DC
  Administered 2022-02-07 – 2022-03-05 (×28): 1000 mL
  Filled 2022-02-07 (×36): qty 1000

## 2022-02-07 MED ORDER — VANCOMYCIN HCL 1750 MG/350ML IV SOLN
1750.0000 mg | Freq: Once | INTRAVENOUS | Status: AC
  Administered 2022-02-07: 1750 mg via INTRAVENOUS
  Filled 2022-02-07: qty 350

## 2022-02-07 MED ORDER — PROPRANOLOL HCL 40 MG PO TABS
40.0000 mg | ORAL_TABLET | Freq: Three times a day (TID) | ORAL | Status: DC
  Administered 2022-02-07 – 2022-03-17 (×116): 40 mg
  Filled 2022-02-07 (×9): qty 1
  Filled 2022-02-07: qty 4
  Filled 2022-02-07 (×7): qty 1
  Filled 2022-02-07: qty 4
  Filled 2022-02-07 (×16): qty 1
  Filled 2022-02-07: qty 4
  Filled 2022-02-07 (×8): qty 1
  Filled 2022-02-07: qty 4
  Filled 2022-02-07 (×31): qty 1
  Filled 2022-02-07: qty 4
  Filled 2022-02-07 (×6): qty 1
  Filled 2022-02-07 (×2): qty 4
  Filled 2022-02-07 (×2): qty 1
  Filled 2022-02-07: qty 4
  Filled 2022-02-07 (×15): qty 1
  Filled 2022-02-07: qty 4
  Filled 2022-02-07: qty 1
  Filled 2022-02-07: qty 4
  Filled 2022-02-07 (×5): qty 1
  Filled 2022-02-07: qty 4
  Filled 2022-02-07 (×10): qty 1

## 2022-02-07 MED ORDER — SODIUM CHLORIDE 0.9 % IV SOLN
2.0000 g | Freq: Three times a day (TID) | INTRAVENOUS | Status: AC
  Administered 2022-02-07 – 2022-02-14 (×21): 2 g via INTRAVENOUS
  Filled 2022-02-07 (×21): qty 12.5

## 2022-02-07 MED ORDER — VANCOMYCIN HCL 1500 MG/300ML IV SOLN
1500.0000 mg | INTRAVENOUS | Status: DC
  Administered 2022-02-08: 1500 mg via INTRAVENOUS
  Filled 2022-02-07: qty 300

## 2022-02-07 MED ORDER — FREE WATER
250.0000 mL | Status: DC
  Administered 2022-02-07 – 2022-02-11 (×47): 250 mL

## 2022-02-07 NOTE — Progress Notes (Signed)
Nutrition Follow-up ? ?DOCUMENTATION CODES:  ? ?Not applicable ? ?INTERVENTION:  ? ?Tube feeds via PEG tube: ?- Change to Jevity 1.5 @ 55 ml/hr (1320 ml/day) ?- ProSource TF 45 ml daily ? ?Tube feeding regimen provides 2020 kcal, 95 grams of protein, and 1008 ml of H2O. ? ?- Continue MVI with minerals daily per tube ? ?- 250 ml free water every 4 hours ?Total free water: 2508 ml  ? ? ?NUTRITION DIAGNOSIS:  ? ?Inadequate oral intake related to inability to eat as evidenced by NPO status. ?Ongoing ? ?GOAL:  ? ?Patient will meet greater than or equal to 90% of their needs ?Met with TF at goal  ? ?MONITOR:  ? ?Vent status, Labs, TF tolerance, I & O's ? ?REASON FOR ASSESSMENT:  ? ?Ventilator, Consult ?Enteral/tube feeding initiation and management ? ?ASSESSMENT:  ? ?50-year-old female who presented to the ED on 4/09 with AMS. PMH of EtOH abuse, HTN, TIA, medication noncompliance. Pt admitted with acute encephalopathy. ? ?Pt weaning on vent, per CCM pt may need long term vent support. Noted new watershed infarcts per MRI. Palliative care consulted to assist family with decisions.  ? ? ?04/09 - pt intubated for airway protection ?04/17 - s/p trach ?04/18 - s/p PEG ?04/19 - per MRI pt with new watershed infarcts in B frontal, B parietal, and L temporal lobes.  ? ?Admit weight: 78.7 kg (BMI 29.8) ?Current weight: 77.4 kg ? ?Medications reviewed and include: colace, folic acid, SSI, novolog, MVI with minerals daily, protonix, miralax, senokot, thiamine  ? ?Vitamin/Mineral Profile: ?Vitamin B6: 4.6 (WNL) ?Vitamin B12: 930 (H) ?Vitamin A: 44.9 (WNL) ?Zinc: 69 (WNL)  ?CRP: 7.6 (H) ?Vitamin B1: 365.8 (4/9) ? ?Labs reviewed: Na 151 ?CBG's: 153-236 ? ?Vital 1.5 @ 55 ml/hr with ProSource TF 45 ml daily ?Tube feeding regimen provides 2020 kcal, 100 grams of protein ? ?Diet Order:   ?Diet Order   ? ?       ?  Diet NPO time specified  Diet effective now       ?  ? ?  ?  ? ?  ? ? ?EDUCATION NEEDS:  ? ?No education needs have been  identified at this time ? ?Skin:  Skin Assessment: Skin Integrity Issues: ?Skin Integrity Issues:: Stage II ?Stage II: labia ? ?Last BM:  4/25 large, type 6 ? ?Height:  ? ?Ht Readings from Last 1 Encounters:  ?01/20/22 5' 4" (1.626 m)  ? ? ?Weight:  ? ?Wt Readings from Last 1 Encounters:  ?02/07/22 77.4 kg  ? ? ?BMI:  Body mass index is 29.29 kg/m?. ? ?Estimated Nutritional Needs:  ? ?Kcal:  1800-2000 ? ?Protein:  90-110 grams ? ?Fluid:  1.8-2.0 L ? ? ?Heather P., RD, LDN, CNSC ?See AMiON for contact information  ? ? ?

## 2022-02-07 NOTE — NC FL2 (Signed)
?[place] MEDICAID FL2 LEVEL OF CARE SCREENING TOOL  ?  ? ?IDENTIFICATION  ?Patient Name: ?Cindy Brooks Birthdate: 08-Mar-1971 Sex: female Admission Date (Current Location): ?01/20/2022  ?[place] and [place] Number: ? [place] ?  Facility and Address:  ?[facility]. [facility] [place] [address], [place],  [address] ?     Provider Number: ?[id number]  ?Attending Physician Name and Address:  ?Kipp Brood, MD ? Relative Name and Phone Number:  ?Macy Polio (Daughter) [phone] ?   ?Current Level of Care: ?Hospital Recommended Level of Care: ?Vent SNF Prior Approval Number: ?  ? ?Date Approved/Denied: ?  PASRR Number: ?[id number] A ? ?Discharge Plan: ?Other (Comment) (Vent SNF) ?  ? ?Current Diagnoses: ?Patient Active Problem List  ? Diagnosis Date Noted  ? Antiphospholipid antibody with hypercoagulable state ([place]) 02/04/2022  ? Pressure injury of skin 02/01/2022  ? PEG (percutaneous endoscopic gastrostomy) status ([place]) 01/29/2022  ? Respiratory failure ([place]) 01/28/2022  ? Tracheostomy dependence ([place]) 01/28/2022  ? Hypertension   ? 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 Acute encephalopathy 01/20/2022  ? ? ?Orientation RESPIRATION BLADDER Height & Weight   ?  ? (Unable to follow commands; responds to pain) ? Tracheostomy, Vent (58m Shiley cuffed trach ; vent 40% FiO2) Incontinent, External catheter Weight: 170 lb 10.2 oz (77.4 kg) ?Height:  5' 4"  (162.6 cm)  ?BEHAVIORAL SYMPTOMS/MOOD NEUROLOGICAL BOWEL NUTRITION STATUS  ?    Incontinent Feeding tube  ?AMBULATORY STATUS COMMUNICATION OF NEEDS Skin   ?Extensive Assist Does not communicate Normal ?  ?  ?  ?    ?     ?     ? ? ?Personal Care Assistance Level of Assistance  ?Bathing, Feeding, Dressing Bathing Assistance: Maximum assistance ?Feeding assistance: Maximum assistance ?Dressing Assistance: Maximum assistance ?   ? ?Functional Limitations Info  ?Sight, Hearing, Speech Sight Info: Adequate ?Hearing Info: Adequate ?Speech Info: Impaired  (intubated)  ? ? ?SPECIAL CARE FACTORS FREQUENCY  ?PT (By licensed PT), OT (By licensed OT)   ?  ?PT Frequency: 2x per week ?OT Frequency: 2x per week ?  ?  ?  ?   ? ? ?Contractures Contractures Info: Not present  ? ? ?Additional Factors Info  ?Code Status, Allergies, Insulin Sliding Scale Code Status Info: Full code ?Allergies Info: Aspirin, Codeine, Penicillins, Strawberry Extract ?  ?Insulin Sliding Scale Info: insulin aspart (novoLOG) injection 0-15 Units ?  ?   ? ?Current Medications (02/07/2022):  This is the current hospital active medication list ?Current Facility-Administered Medications  ?Medication Dose Route Frequency Provider Last Rate Last Admin  ? 0.9 %  sodium chloride infusion   Intravenous PRN XRosalin Hawking MD   Stopped at 02/03/22 1247  ? acetaminophen (TYLENOL) 160 MG/5ML solution 650 mg  650 mg Per Tube Q6H PRN SAnders Simmonds MD   650 mg at 02/07/22 0844  ? amLODipine (NORVASC) tablet 10 mg  10 mg Per Tube Daily CJacky Kindle MD   10 mg at 02/06/22 1100  ? chlorhexidine gluconate (MEDLINE KIT) (PERIDEX) 0.12 % solution 15 mL  15 mL Mouth Rinse BID Olalere, Adewale A, MD   15 mL at 02/07/22 0845  ? Chlorhexidine Gluconate Cloth 2 % PADS 6 each  6 each Topical Daily OSherrilyn RistA, MD   6 each at 02/06/22 2106  ? clopidogrel (PLAVIX) tablet 75 mg  75 mg Per Tube Daily CJacky Kindle MD   75 mg at 02/06/22 1100  ? docusate (COLACE) 50 MG/5ML liquid 100 mg  100 mg Per Tube BID PRN Spero Geralds, MD      ? docusate (COLACE) 50 MG/5ML liquid 100 mg  100 mg Per Tube BID Spero Geralds, MD   100 mg at 02/06/22 2106  ? enoxaparin (LOVENOX) injection 40 mg  40 mg Subcutaneous Q24H Minor, Grace Bushy, NP   40 mg at 02/06/22 1100  ? feeding supplement (JEVITY 1.5 CAL/FIBER) liquid 1,000 mL  1,000 mL Per Tube Continuous Agarwala, Ravi, MD      ? feeding supplement (PROSource TF) liquid 45 mL  45 mL Per Tube Daily Spero Geralds, MD   45 mL at 02/06/22 1100  ? feeding supplement (VITAL 1.5 CAL) liquid  1,000 mL  1,000 mL Per Tube Continuous Kipp Brood, MD 55 mL/hr at 02/07/22 0431 1,000 mL at 02/07/22 0431  ? fentaNYL (SUBLIMAZE) injection 50-100 mcg  50-100 mcg Intravenous Q2H PRN Spero Geralds, MD   100 mcg at 02/07/22 3009  ? folic acid (FOLVITE) tablet 1 mg  1 mg Per Tube Daily Spero Geralds, MD   1 mg at 02/06/22 1100  ? free water 250 mL  250 mL Per Tube Q4H Corey Harold, NP   250 mL at 02/07/22 0855  ? insulin aspart (novoLOG) injection 0-15 Units  0-15 Units Subcutaneous Q4H Elmer Sow, MD   3 Units at 02/07/22 [id number]  ? insulin aspart (novoLOG) injection 4 Units  4 Units Subcutaneous Q4H Kipp Brood, MD   4 Units at 02/07/22 0845  ? labetalol (NORMODYNE) injection 10 mg  10 mg Intravenous Q2H PRN Chesley Mires, MD   10 mg at 02/06/22 0749  ? levETIRAcetam (KEPPRA) tablet 1,000 mg  1,000 mg Per Tube BID Rosalin Hawking, MD   1,000 mg at 02/06/22 2105  ? MEDLINE mouth rinse  15 mL Mouth Rinse 10 times per day Olalere, Adewale A, MD   15 mL at 02/07/22 0600  ? metoprolol tartrate (LOPRESSOR) 25 mg/10 mL oral suspension 50 mg  50 mg Per Tube BID Erick Colace, NP   50 mg at 02/06/22 2105  ? metoprolol tartrate (LOPRESSOR) injection 2.5 mg  2.5 mg Intravenous Q6H PRN Gerald Leitz D, NP   2.5 mg at 02/07/22 0844  ? multivitamin with minerals tablet 1 tablet  1 tablet Per Tube Daily Spero Geralds, MD   1 tablet at 02/06/22 1100  ? pantoprazole sodium (PROTONIX) 40 mg/20 mL oral suspension 40 mg  40 mg Per Tube Daily Spero Geralds, MD   40 mg at 02/06/22 1100  ? polyethylene glycol (MIRALAX / GLYCOLAX) packet 17 g  17 g Per Tube Daily PRN Spero Geralds, MD      ? polyethylene glycol (MIRALAX / GLYCOLAX) packet 17 g  17 g Per Tube BID Kipp Brood, MD   17 g at 02/06/22 2135  ? rosuvastatin (CRESTOR) tablet 40 mg  40 mg Per Tube Daily Janine Ores, NP   40 mg at 02/06/22 1100  ? senna (SENOKOT) tablet 8.6 mg  1 tablet Per Tube Daily Agarwala, Einar Grad, MD   8.6 mg at 02/06/22 1100  ?  thiamine tablet 100 mg  100 mg Per Tube Daily Garvin Fila, MD   100 mg at 02/06/22 1100  ? ? ? ?Discharge Medications: ?Please see discharge summary for a list of discharge medications. ? ?Relevant Imaging Results: ? ?Relevant Lab Results: ? ? ?Additional Information ?SS# [id number]. No COVID immunizations on file. ? ?Benard Halsted,  LCSW ? ? ? ? ?

## 2022-02-07 NOTE — Progress Notes (Signed)
Sputum obtained and sent to lab

## 2022-02-07 NOTE — Progress Notes (Addendum)
? ?NAME:  Cindy Brooks, MRN:  [id number], DOB:  05-28-1971, LOS: 52 ?ADMISSION DATE:  01/20/2022, CONSULTATION DATE: 01/20/2022 ?REFERRING MD: Triad, CHIEF COMPLAINT: Altered mental status ? ?History of Present Illness:  ?51 year old female who was found outside in her car by family on 4/8 and was moved to the house and left for 5 hours at which time she did not move. Question if she had seizures therefore emergency medical services was activated.  She is transferred to [facility] emergency department near obtunded has extremely loud respirations copious secretions and difficulty controlling her airway.  She has been seen by neurology.  She admitted to the intensive care unit for further evaluation and treatment. ? ?Pertinent  Medical History  ?HTN  ?TIA ? 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 Trach placed.  ?4/18 PEG placed. Spiking fever. Lactate spiked. Got fluid. Cleared.  ?4/22 Tolerated 6hrs of ATC day prior. No major events overnight, appears that stroke had [place] meeting with family 4/21 with no decision made ?4/23 No issues overnight, on vent this am, remains unresponsive  ? ?Interim History / Subjective:  ?Remains unresponsive. No changes. ? ?Objective   ?Blood pressure 114/88, pulse (Abnormal) 114, temperature 100 ?F (37.8 ?C), temperature source Oral, resp. rate 15, height 5\' 4"  (1.626 m), weight 77.4 kg, SpO2 97 %. ?   ?Vent Mode: CPAP;PSV ?FiO2 (%):  [40 %] 40 % ?Set Rate:  [12 bmp] 12 bmp ?Vt Set:  [440 mL] 440 mL ?PEEP:  [5 cmH20] 5 cmH20 ?Pressure Support:  [12 cmH20] 12 cmH20 ?Plateau Pressure:  [16 cmH20-20 cmH20] 16 cmH20  ? ?Intake/Output Summary (Last 24 hours) at 02/07/2022 1103 ?Last data filed at 02/07/2022 0700 ?Gross per 24 hour  ?Intake 2410 ml  ?Output  1850 ml  ?Net 560 ml  ? ?Filed Weights  ? 02/02/22 0500 02/06/22 0500 02/07/22 0447  ?Weight: 81.5 kg 77.6 kg 77.4 kg  ? ? ?Examination: ?General this is a 51 year old female who remains comatose on vent ?Hent tongue protruding from mouth. Trach unremarkable ?Pulm coarse rhonchi  ?Card tachy rrr ?Abd soft  ?Ext warm and dry  ?Neuro has a cough only  ? ? ? ?Assessment & Plan:  ?Principal Problem: ?  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  Acute encephalopathy ?  Respiratory failure ([place]) ?  Tracheostomy dependence ([place]) ?  Hypertension ?  PEG (percutaneous endoscopic gastrostomy) status ([place]) ?  Pressure injury of skin ?  Antiphospholipid antibody with hypercoagulable state ([place]) ?  Hypernatremia ?  Coma ([place]) ?  Fever ?  Leukocytosis ?  Sympathetic storming ? ? ? ?Comatose s/p thalamic stroke w/ bilateral thalamic, brain stem and diffuse watershed infarcts as well as Putative artery of Percheron infarct ?Plan ?Supportive care ?Will need trach/vent facility  ?Cont plavix and statin  ? ?HTN & tachycardia in setting of what appears to Paroxysmal sympathetic hyperactivity (neurostorm) ?Plan ?Change metoprolol to propranolol; may need to titrate up further 4/28 ?Cont PRN lopressor  ?Cont norvasc  ? ?Vent/trach dependent (4/16) ?-didn't tolerate wean well. Has paroxysms of cough w/out sig stimulation ?Plan ?Cont full vent support  ?VAP bundle ?CXR today  ?Needs vent/snf ? ?Dysphagia/peg dependent ?Plan ?Cont tubefeeds ? ?Fluid and electrolyte imbalance: hypernatremia ?Plan ?Adjust free water ?Recheck bmp am  ? ? ?Mild  leukocytosis w/ fever ?Plan ?CXR ? ?Antiphospholipid antibody syndrome w/ hypercoagulable state  ?Plan ?Cont plavix and lovenox  ? ?Pressure ulcer on labia ?Plan ?Wound care  ? ?Best Practice (right click and "Reselect all SmartList Selections" daily)  ?Diet/type: NPO with tube feeds ?DVT prophylaxis: LMWH ?GI prophylaxis: PPI ?Lines: N/A ?Foley:  N/A ?Code Status:  full code ?Last date of  multidisciplinary goals of care discussion: ?Family updated at length 4/20.  ? ?My cct 43 min ? ?Erick Colace ACNP-BC ?[place] ?Pager # [phone] OR # [phone] if no answer ? ? ? ? ? ? ? ?

## 2022-02-07 NOTE — Progress Notes (Signed)
Patient ID: Cindy Brooks, female   DOB: 05/05/1971, 51 y.o.   MRN: [id number] ? ? ? ?Progress Note from the Palliative Medicine Team at [facility] ? ? ?Patient Name: Cindy Brooks        ?Date: 02/07/2022 ?DOB: 11-Jul-1971  Age: 51 y.o. MRN#: [id number] ?Attending Physician: Kipp Brood, MD ?Primary Care Physician: Pcp, No ?Admit Date: 01/20/2022 ? ? ?Medical records reviewed  ? ?51 y.o. female  admitted on  01/20/2022 having been   found outside in her car by family on 4/8 and was moved to the house and left for 5 hours at which time she did not move, transported by EMS to [facility] emergency room, patient was obtunded with loud respirations, copious secretions, difficulty protecting her airway, was intubated ?  ?Question if she had seizures therefore emergency medical services was activated.   ?  ?Significant Hospital events ?  ?4/9 Admit  ?     -CT head No acute abnormality.  ?      -CTA head & neck severe bilateral P1 and right P2 stenoses as well as proximal left  M2 stenosis and severe right ACA stenosis, 40% stenosis of left ICA ?     -MRI  Patchy restricted diffusion in bilateral thalami and midbrain with multiple     punctate acute ischemic infarcts in bilateral cerebral hemispheres and multiple chronic microhemorrhages ?      -Repeat MRI 4/19 multiple infarcts in bilateral frontal and parietal lobes, possibly in watershed distribution ?  ?  ?4/12 ongoing high dose thiamine for possible Wernike's encephalopathy  ?4/13 IV sedation stopped, phenobarbital wean ?4/17 still not waking up. Trach placed.  ?4/18 PEG placed. Spiking fever. Lactate spiked. Got fluid. Cleared.  ?4/22 Tolerated 6hrs of ATC day prior. No major events overnight, appears that stroke had [place] meeting with family 4/21 with no decision made ?4/27 remains on the , unsuccessful wean, remains unresponsive  ?  ?Family face treatment option decisions, advanced directive decisions and anticipatory care needs. ?  ?I initially spoke to family and  introduced palliative medicine on 02/04/2022. ? ?This NP visited patient at the bedside as a follow up for palliative medicine needs and emotional support and to meet with family as scheduled. ? ?I met with patient's 51 children at bedside. ? ?Lengthy education regarding current medical situation and the unfortunate long-term poor prognosis.  Detailed conversation regarding the likely trajectory of a vent dependent, unresponsive, bedbound patient specifically as it relates to infection susceptibility and skin breakdown. ? ? ?Education offered on the difference between aggressive medical intervention path and a palliative comfort path for this patient at this time in this situation. ? ?Emotional support offered ? ?Lengthy discussion regarding limited transition of care options for patient who is vent dependent.    ? ?Plan of Care: ?-Full code ?-Family is open to all offered and available medical interventions to prolong life. ? ? ?I encouraged her children to continue conversations among themselves and with the medical team as they make decisions into the future and always keeping the patient at the center of those decisions. ? ?Questions and concerns addressed   Discussed with Dr Lynetta Mare via secure chat ? ?PMT will continue to support holistically ? ?Wadie Lessen NP  ?Palliative Medicine Team Team Phone # [phone] ?Pager [phone] ?  ?

## 2022-02-07 NOTE — Progress Notes (Signed)
Pharmacy Antibiotic Note ? ?Cindy Brooks is a 51 y.o. female admitted on 01/20/2022 with pneumonia.  Pharmacy has been consulted for Vancomycin and Cefepime dosing. ? ?Day # 18 of admission with new fever up to Tm 102.9, ST, and new leukocytosis up to 14.3.  ?4/27 Trach Asp with + gram stain, culture pending.  ?4/27 CXR read as low lung volumes with bibasilar atelectasis. ?Patient noted to have PCN allergy listed as anaphylaxis and swelling. Patient received Ceftriaxone from 4/9 to 4/13 without issues.  ? ?SCr 1.07 with CrCl ~63 mL/min and good UOP for now. ? ?Plan: ?Cefepime 2g IV every 8 hours.  ?Vancomycin 1750 mg IV x1 then 1500 mg IV every 24 hours.  ?-eAUC 544, SCr used 1.07 ?Monitor renal function closely, clinical status, and culture results.  ? ?Height: 5\' 4"  (162.6 cm) ?Weight: 77.4 kg (170 lb 10.2 oz) ?IBW/kg (Calculated) : 54.7 ? ?Temp (24hrs), Avg:100.5 ?F (38.1 ?C), Min:99.4 ?F (37.4 ?C), Max:102.9 ?F (39.4 ?C) ? ?Recent Labs  ?Lab 02/01/22 ?02/03/22 02/02/22 ?02/04/22 02/03/22 ?02/05/22 02/04/22 ?2224 02/07/22 ?02/09/22  ?WBC 12.6* 11.1* 9.9 9.7 14.3*  ?CREATININE 0.96 0.84 0.79 1.07* 1.07*  ?  ?Estimated Creatinine Clearance: 63.4 mL/min (A) (by C-G formula based on SCr of 1.07 mg/dL (H)).   ? ?Allergies  ?Allergen Reactions  ? Aspirin Hives and Rash  ? Codeine Hives and Rash  ? Penicillins Anaphylaxis, Swelling and Other (See Comments)  ? Strawberry Extract Anaphylaxis and Hives  ? ? ?Antimicrobials this admission: ?Cefepime 4/27 >> ?Vanc 4/27 >> ? ?Dose adjustments this admission: ? ? ?Microbiology results: ?4/20 BCx: negative ?4/27 Sputum: pending  ?4/9 MRSA PCR: negative ? ?Thank you for allowing pharmacy to be a part of this patient?s care. ? ?6/9, PharmD, BCPS, BCCCP ?Clinical Pharmacist ?Please refer to [facility] for [facility] numbers ?02/07/2022 5:00 PM ? ?

## 2022-02-08 DIAGNOSIS — J189 Pneumonia, unspecified organism: Secondary | ICD-10-CM

## 2022-02-08 LAB — BASIC METABOLIC PANEL
Anion gap: 9 (ref 5–15)
Anion gap: 8 (ref 5–15)
BUN: 35 mg/dL — ABNORMAL HIGH (ref 6–20)
BUN: 34 mg/dL — ABNORMAL HIGH (ref 6–20)
CO2: 29 mmol/L (ref 22–32)
CO2: 28 mmol/L (ref 22–32)
Calcium: 9.5 mg/dL (ref 8.9–10.3)
Calcium: 9.4 mg/dL (ref 8.9–10.3)
Chloride: 111 mmol/L (ref 98–111)
Chloride: 113 mmol/L — ABNORMAL HIGH (ref 98–111)
Creatinine, Ser: 0.99 mg/dL (ref 0.44–1.00)
Creatinine, Ser: 0.88 mg/dL (ref 0.44–1.00)
GFR, Estimated: >60 mL/min (ref >60)
GFR, Estimated: >60 mL/min (ref >60)
Glucose, Bld: 122 mg/dL — ABNORMAL HIGH (ref 70–99)
Glucose, Bld: 170 mg/dL — ABNORMAL HIGH (ref 70–99)
Potassium: 4.2 mmol/L (ref 3.5–5.1)
Potassium: 3.7 mmol/L (ref 3.5–5.1)
Sodium: 149 mmol/L — ABNORMAL HIGH (ref 135–145)
Sodium: 149 mmol/L — ABNORMAL HIGH (ref 135–145)

## 2022-02-08 LAB — CBC
HCT: 38.2 % (ref 36.0–46.0)
Hemoglobin: 11.9 g/dL — ABNORMAL LOW (ref 12.0–15.0)
MCH: 26.6 pg (ref 26.0–34.0)
MCHC: 31.2 g/dL (ref 30.0–36.0)
MCV: 85.3 fL (ref 80.0–100.0)
Platelets: 378 10*3/uL (ref 150–400)
RBC: 4.48 MIL/uL (ref 3.87–5.11)
RDW: 14.6 % (ref 11.5–15.5)
WBC: 15.5 10*3/uL — ABNORMAL HIGH (ref 4.0–10.5)
nRBC: 0 % (ref 0.0–0.2)

## 2022-02-08 LAB — GLUCOSE, CAPILLARY
Glucose-Capillary: 119 mg/dL — ABNORMAL HIGH (ref 70–99)
Glucose-Capillary: 120 mg/dL — ABNORMAL HIGH (ref 70–99)
Glucose-Capillary: 137 mg/dL — ABNORMAL HIGH (ref 70–99)
Glucose-Capillary: 139 mg/dL — ABNORMAL HIGH (ref 70–99)
Glucose-Capillary: 168 mg/dL — ABNORMAL HIGH (ref 70–99)
Glucose-Capillary: 174 mg/dL — ABNORMAL HIGH (ref 70–99)

## 2022-02-08 LAB — MRSA NEXT GEN BY PCR, NASAL: MRSA by PCR Next Gen: NOT DETECTED

## 2022-02-08 MED ORDER — AMANTADINE HCL 50 MG/5ML PO SOLN
100.0000 mg | Freq: Two times a day (BID) | ORAL | Status: DC
  Administered 2022-02-08 – 2022-03-17 (×76): 100 mg
  Filled 2022-02-08 (×77): qty 10

## 2022-02-08 NOTE — Progress Notes (Signed)
SLP Cancellation Note ? ?Patient Details ?Name: Cindy Brooks ?MRN: [id number] ?DOB: December 16, 1970 ? ? ?Cancelled treatment:       Reason Eval/Treat Not Completed: Patient not medically ready (SLP has beem following pt since 4/16, but pt has not been appropriate for evaluation or treatment. Pt has been on trach collar trials, but remains unresponsive and prognosis for long term recovery is poor per notes. SLP will sign off at this time, but please reconsult if clinically indicated.) ? ?[name] I. Vear Clock, MS, CCC-SLP ?Acute Rehabilitation Services ?Office number [phone] ?Pager [phone] ? ?Scheryl Marten ?02/08/2022, 5:20 PM ?

## 2022-02-08 NOTE — TOC Initial Note (Signed)
Transition of Care (TOC) - Initial/Assessment Note  ? ? ?Patient Details  ?Name: Cindy Brooks ?MRN: [id number] ?Date of Birth: 1970/12/04 ? ?Transition of Care (TOC) CM/SW Contact:    ?Benard Halsted, LCSW ?Phone Number: ?02/08/2022, 10:19 AM ? ?Clinical Narrative:                 ?CSW spoke with patient's two daughters regarding next steps for patient. CSW made them aware that patient would be ready to transition to a Vent SNF and that the only option in [place] to accept Medicaid-pending or Medicaid is [facility]. CSW answered questions and they reported being hopeful to keep patient in [place]. They [place] [facility]. CSW explained that the timeline is unknown and facility has to be willing to accept patient on a Letter of Guarantee from the hospital pending Medicaid and that if patient is able to start weaning from the vent then the plan could change day by day. Daughter asked if they could be trained to take patient home and CSW explained that it would be difficult due to cost of vent and without adequate home care as even Medicaid will not pay for 24/7 care (and two family members would need to be trained on vent management at all times). They reported understanding. CSW faxed referral to [facility] to being reviewing.  ? ?Expected Discharge Plan: [place] ?Barriers to Discharge: Continued Medical Work up, Inadequate or no insurance, Vent Bed not available ? ? ?Patient Goals and CMS Choice ?Patient states their goals for this hospitalization and ongoing recovery are:: Return home ?CMS Medicare.gov Compare Post Acute Care list provided to:: Patient Represenative (must comment) ?Choice offered to / list presented to : Adult Children ? ?Expected Discharge Plan and Services ?Expected Discharge Plan: [place] ?In-house Referral: Clinical Social Work ?  ?Post Acute Care Choice: [place] ?Living arrangements for the past 2 months: [place] ?                ?  ?  ?  ?  ?   ?  ?  ?  ?  ?  ? ?Prior Living Arrangements/Services ?Living arrangements for the past 2 months: [place] ?Lives with:: Self ?Patient language and need for interpreter reviewed:: Yes ?Do you feel safe going back to the place where you live?: Yes      ?Need for Family Participation in Patient Care: Yes (Comment) ?Care giver support system in place?: Yes (comment) ?  ?Criminal Activity/Legal Involvement Pertinent to Current Situation/Hospitalization: No - Comment as needed ? ?Activities of Daily Living ?  ?  ? ?Permission Sought/Granted ?Permission sought to share information with : Facility Sport and exercise psychologist, Family Supports ?  ? Share Information with NAME: Cindy Brooks ? Permission granted to share info w AGENCY: SNFs ? Permission granted to share info w Relationship: Daughters ? Permission granted to share info w Contact Information: [phone] ? ?Emotional Assessment ?Appearance:: Appears stated age ?Attitude/Demeanor/Rapport: Unable to Assess ?Affect (typically observed): Unable to Assess ?Orientation: :  (intubated) ?Alcohol / Substance Use: Not Applicable ?Psych Involvement: No (comment) ? ?Admission diagnosis:  Hypertensive encephalopathy [I67.4] ?Somnolence [R40.0] ?Hypoglycemia [E16.2] ?Acute lower UTI [N39.0] ?Acute encephalopathy [G93.40] ?AMS (altered mental status) [R41.82] ?Patient Active Problem List  ? Diagnosis Date Noted  ? Hypernatremia 02/07/2022  ? Coma ([place]) 02/07/2022  ? Fever 02/07/2022  ? Leukocytosis 02/07/2022  ? Sympathetic storming 02/07/2022  ? Antiphospholipid antibody with hypercoagulable state ([place]) 02/04/2022  ? Pressure  injury of skin 02/01/2022  ? PEG (percutaneous endoscopic gastrostomy) status ([place]) 01/29/2022  ? Respiratory failure ([place]) 01/28/2022  ? 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 Hypertension   ? 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 Acute encephalopathy 01/20/2022  ? ?PCP:  Pcp, No ?Pharmacy:   ?[id number] - [place] [address] [address] ?Phone: [phone] Fax: [phone] ? ? ? ? ?Social Determinants of Health (SDOH) Interventions ?  ? ?Readmission Risk Interventions ?   ? View : No data to display.  ?  ?  ?  ? ? ? ?

## 2022-02-08 NOTE — Progress Notes (Signed)
Patient placed on 40% ATC and is tolerating well at this time. ?

## 2022-02-08 NOTE — Progress Notes (Signed)
? ?NAME:  Cindy Brooks, MRN:  [id number], DOB:  10/08/1971, LOS: 27 ?ADMISSION DATE:  01/20/2022, CONSULTATION DATE: 01/20/2022 ?REFERRING MD: Triad, CHIEF COMPLAINT: Altered mental status ? ?History of Present Illness:  ?51 year old female who was found outside in her car by family on 4/8 and was moved to the house and left for 5 hours at which time she did not move. Question if she had seizures therefore emergency medical services was activated.  She is transferred to [facility] emergency department near obtunded has extremely loud respirations copious secretions and difficulty controlling her airway.  She has been seen by neurology.  She admitted to the intensive care unit for further evaluation and treatment. ? ?Pertinent  Medical History  ?HTN  ?TIA ? 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 Trach placed.  ?4/18 PEG placed. Spiking fever. Lactate spiked. Got fluid. Cleared.  ?4/22 Tolerated 6hrs of ATC day prior. No major events overnight, appears that stroke had [place] meeting with family 4/21 with no decision made ?4/23 No issues overnight, on vent this am, remains unresponsive  ?4/27 changed to propranolol from metoprolol for neuro-storming. Started on vanc and cefepime for possible HCAP  ? ?Interim History / Subjective:  ?No change  ? ?Objective   ?Blood pressure (Abnormal) 158/98, pulse (Abnormal) 104, temperature (Abnormal) 100.4 ?F (38 ?C), temperature source Oral, resp. rate (Abnormal) 27, height 5\' 4"  (1.626 m), weight 77.4 kg, SpO2 97 %. ?   ?Vent Mode: PSV;CPAP ?FiO2 (%):  [40 %] 40 % ?Set Rate:  [12 bmp] 12 bmp ?Vt Set:  [440 mL] 440 mL ?PEEP:  [5 cmH20] 5 cmH20 ?Pressure Support:  [12 cmH20] 12 cmH20 ?Plateau Pressure:  [16 cmH20-20 cmH20] 16 cmH20  ? ?Intake/Output  Summary (Last 24 hours) at 02/08/2022 1100 ?Last data filed at 02/08/2022 0700 ?Gross per 24 hour  ?Intake 1871.05 ml  ?Output 1700 ml  ?Net 171.05 ml  ? ?Filed Weights  ? 02/02/22 0500 02/06/22 0500 02/07/22 0447  ?Weight: 81.5 kg 77.6 kg 77.4 kg  ? ? ?Examination: ?General not much changed still on vent. Currently on PSV ?HENT trach not remarkable. Tongue protruding  ?Pulm scattered rhonchi. Now on PSV ?Card rrr ?Abd soft ?Ext warm  ?Neuro has a cough but really not much else.  ? ? ?Assessment & Plan:  ?Principal Problem: ?  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  Acute encephalopathy ?  Respiratory failure ([place]) ?  Tracheostomy dependence ([place]) ?  Hypertension ?  PEG (percutaneous endoscopic gastrostomy) status ([place]) ?  Pressure injury of skin ?  Antiphospholipid antibody with hypercoagulable state ([place]) ?  Hypernatremia ?  Coma ([place]) ?  Fever ?  Leukocytosis ?  Sympathetic storming ?  HCAP (healthcare-associated pneumonia) ? ? ? ?Comatose s/p thalamic stroke w/ bilateral thalamic, brain stem and diffuse watershed infarcts as well as Putative artery of Percheron infarct ?Plan ?Plavix and statin ?Needs vent/snf ? ? ?HTN & tachycardia in setting of what appears to Paroxysmal sympathetic hyperactivity (neurostorm) ?Plan ?Changed to propranolol. Seems a little better. We do have room for titration in future if needed ?Cont Norvasc  ? 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 ?Mild leukocytosis w/ fever  ?-cxr showing bibasilar atx vs infiltrate will treat at HCAP  ?Plan ?Day 2 vanc and cefepime ?Ck MRSA PCR. If neg stop vanc. Otherwise plan of 7d rx  ? ?Dysphagia/peg dependent ?Plan ?tubefeeds ? ?Fluid and electrolyte imbalance: hypernatremia ?Plan ?Adjusted free  water ?Repeat chem today and in am  ? ? 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 ?Pressure ulcer on labia ?Plan ?Wound care  ? ?Best Practice (right click and "Reselect all SmartList Selections" daily)  ?Diet/type: NPO with tube feeds ?DVT prophylaxis: LMWH ?GI prophylaxis: PPI ?Lines: N/A ?Foley:  N/A ?Code Status:  full code ?Last date of multidisciplinary goals of care discussion: ?Family updated at length 4/20.  ? ?My cct 31 min  ? ?Erick Colace ACNP-BC ?[place] ?Pager # [phone] OR # [phone] if no answer ? ? ? ? ? ? ? ?

## 2022-02-09 LAB — BASIC METABOLIC PANEL
Anion gap: 7 (ref 5–15)
BUN: 27 mg/dL — ABNORMAL HIGH (ref 6–20)
CO2: 28 mmol/L (ref 22–32)
Calcium: 9.4 mg/dL (ref 8.9–10.3)
Chloride: 110 mmol/L (ref 98–111)
Creatinine, Ser: 0.8 mg/dL (ref 0.44–1.00)
GFR, Estimated: >60 mL/min (ref >60)
Glucose, Bld: 140 mg/dL — ABNORMAL HIGH (ref 70–99)
Potassium: 4.4 mmol/L (ref 3.5–5.1)
Sodium: 145 mmol/L (ref 135–145)

## 2022-02-09 LAB — GLUCOSE, CAPILLARY
Glucose-Capillary: 110 mg/dL — ABNORMAL HIGH (ref 70–99)
Glucose-Capillary: 114 mg/dL — ABNORMAL HIGH (ref 70–99)
Glucose-Capillary: 115 mg/dL — ABNORMAL HIGH (ref 70–99)
Glucose-Capillary: 126 mg/dL — ABNORMAL HIGH (ref 70–99)
Glucose-Capillary: 140 mg/dL — ABNORMAL HIGH (ref 70–99)
Glucose-Capillary: 145 mg/dL — ABNORMAL HIGH (ref 70–99)

## 2022-02-09 LAB — CULTURE, RESPIRATORY W GRAM STAIN: Culture: NORMAL

## 2022-02-09 MED ORDER — LEVETIRACETAM 500 MG PO TABS
500.0000 mg | ORAL_TABLET | Freq: Two times a day (BID) | ORAL | Status: AC
  Administered 2022-02-09 – 2022-02-14 (×10): 500 mg
  Filled 2022-02-09 (×10): qty 1

## 2022-02-09 MED ORDER — GABAPENTIN 300 MG PO CAPS
300.0000 mg | ORAL_CAPSULE | Freq: Two times a day (BID) | ORAL | Status: DC
  Administered 2022-02-09 – 2022-03-03 (×45): 300 mg
  Filled 2022-02-09 (×45): qty 1

## 2022-02-09 NOTE — Progress Notes (Signed)
? ?NAME:  Cindy Brooks, MRN:  [id number], DOB:  1971/06/08, LOS: 20 ?ADMISSION DATE:  01/20/2022, CONSULTATION DATE: 01/20/2022 ?REFERRING MD: Triad, CHIEF COMPLAINT: Altered mental status ? ?History of Present Illness:  ?51 year old female who was found outside in her car by family on 4/8 and was moved to the house and left for 5 hours at which time she did not move. Question if she had seizures therefore emergency medical services was activated.  She is transferred to [facility] emergency department near obtunded has extremely loud respirations copious secretions and difficulty controlling her airway.  She has been seen by neurology.  She admitted to the intensive care unit for further evaluation and treatment. ? ?Pertinent  Medical History  ?HTN  ?TIA ? 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 Trach placed.  ?4/18 PEG placed. Spiking fever. Lactate spiked. Got fluid. Cleared.  ?4/22 Tolerated 6hrs of ATC day prior. No major events overnight, appears that stroke had [place] meeting with family 4/21 with no decision made ?4/23 No issues overnight, on vent this am, remains unresponsive  ?4/27 changed to propranolol from metoprolol for neuro-storming. Started on vanc and cefepime for possible HCAP  ?4/28 trial on open ended trach collar ? ?Interim History / Subjective:  ? ?Tolerating tracheostomy trial. Continues to be tachypneic intermittently. Still having protracted coughing jags.  ? ?Objective   ?Blood pressure 116/85, pulse (!) 110, temperature 99.6 ?F (37.6 ?C), temperature source Axillary, resp. rate (!) 22, height 5\' 4"  (1.626 m), weight 78.9 kg, SpO2 97 %. ?   ?Vent Mode: PRVC ?FiO2 (%):  [40 %] 40 % ?Set Rate:  [12 bmp] 12 bmp ?Vt Set:  [440 mL] 440 mL ?PEEP:  [5 cmH20] 5  cmH20 ?Plateau Pressure:  [12 cmH20-18 cmH20] 18 cmH20  ? ?Intake/Output Summary (Last 24 hours) at 02/09/2022 1024 ?Last data filed at 02/09/2022 0813 ?Gross per 24 hour  ?Intake 7783.14 ml  ?Output 1725 ml  ?Net 6058.14 ml  ? ? ?Filed Weights  ? 02/06/22 0500 02/07/22 0447 02/09/22 0500  ?Weight: 77.6 kg 77.4 kg 78.9 kg  ? ? ?Examination: ?General not much changed still on vent. Currently on trach collar ?HENT trach not remarkable. Tongue protruding  ?Pulm scattered rhonchi. Now on PSV ?Card rrr ?Abd soft ?Ext warm  ?Neuro has a cough but really not much else.  ? ? ?Assessment & Plan:  ?Thalamic stroke (HCC) ?Bilateral thalamic, brainstem and diffuse watershed infarcts on MRI. Putative artery of Percheron infarct. ? ?- On Plavix and Crestor.  ?- Limited improvement after 15 days signals potentially poor prognosis, although AoP strokes often improved over time. May take several months.  ?- Added amantadine ?- Wean Keppra as may be contributing to somnolence. ? ? ?Acute encephalopathy ?Related to multifocal CVA's and possible Wernicke's encephalopathy.  ? ?- Limited improvement after 15 days portends very poor prognosis especially given presence of brainstem findings.  ?- Palliative care consultation as patient is likely to be confined to long-term trach/vent facility.  ?- Family still requesting full scope of care.  ?- Have added amantadine ? ?Respiratory failure (HCC) ?Failure to wean from ventilator despite relatively normal chest X-ray.  CT angiography negative for pulmonary embolism.  Moderate WBCs with polymicrobial flora on respiratory culture. ?Suspect obesity hypoventilation causing lung restriction. ? ?-Patient has  variable respiratory effort irrespective of vent setting.  Trial of trach collar today.  Follow saturation. ?- Trial of diuresis to facilitate weaning. ?- Sputum cultures and empiric antibiotics.  Discontinue if cultures are negative. Otherwise complete 7days. ?- Trial of gabapentin for neurogenic  cough.  ? ?Tracheostomy dependence (HCC) ?Tracheostomy placed 4/16.  ? ?- Routine trach care. Change tracheostomy q7days  ?- Continue with cuffed tracheostomy for mechanical ventilation if necessary ? ?PEG (percutaneous endoscopic gastrostomy) status (HCC) ?Placed 4/17  ? ?- Continue usual PEG care.  ? ?Antiphospholipid antibody with hypercoagulable state (HCC) ?Tests performed 4/16 ?Anticardiolipin IGM positve. Lupus anticoagulant PPT prolonged. ? ?- Currently on Plavix and prophylactic Lovenox.  ? ?Hypertension ?-Well controlled on amlodipine and propranolol ? ?Pressure injury of skin ?Labial injury ? ?- Wound care ?  ? ?Best Practice (right click and "Reselect all SmartList Selections" daily)  ?Diet/type: NPO with tube feeds ?DVT prophylaxis: LMWH ?GI prophylaxis: PPI ?Lines: N/A ?Foley:  N/A ?Code Status:  full code ?Last date of multidisciplinary goals of care discussion: ?Family updated at length 4/20.  ? ?Lynnell Catalan, MD FRCPC ?ICU Physician ?[facility]  ?Pager: [phone] ?Or Epic Secure Chat ?After hours: [phone]. ? ?02/09/2022, 10:47 AM ? ? ? ? ? ? ? ? ?

## 2022-02-09 NOTE — Progress Notes (Signed)
RN went to administer two 50 mcg Fentanyl injections.  RN took the plastic surrounding off of the first 50 mcg injection and the plunger came out of the actual injection and the Fentanyl medication poured out and onto the floor.  Event witnessed by Rod Mae, RN.  Additional 50 mcg Fentanyl injection pulled from Pyxis and given to pt in addition to the original 50 mcg.  Pt received a total of 100 mcg as charted in the [facility].     ?

## 2022-02-09 NOTE — Progress Notes (Signed)
PT found on 40% ATC tolerating well. Vent on standby in room. Trach secure with bilateral breath sounds. PT suctioned and trach care done. Will continue on ATC as pt tolerates overnight. No resp distress noted. Will continue to monitor.  ?

## 2022-02-10 LAB — GLUCOSE, CAPILLARY
Glucose-Capillary: 115 mg/dL — ABNORMAL HIGH (ref 70–99)
Glucose-Capillary: 162 mg/dL — ABNORMAL HIGH (ref 70–99)
Glucose-Capillary: 118 mg/dL — ABNORMAL HIGH (ref 70–99)
Glucose-Capillary: 144 mg/dL — ABNORMAL HIGH (ref 70–99)
Glucose-Capillary: 140 mg/dL — ABNORMAL HIGH (ref 70–99)

## 2022-02-10 MED ORDER — SCOPOLAMINE 1 MG/3DAYS TD PT72
1.0000 | MEDICATED_PATCH | TRANSDERMAL | Status: DC
Start: 2022-02-10 — End: 2022-02-28
  Administered 2022-02-10 – 2022-02-28 (×7): 1.5 mg via TRANSDERMAL
  Filled 2022-02-10 (×7): qty 1

## 2022-02-10 NOTE — Progress Notes (Addendum)
? ?NAME:  Cindy Brooks, MRN:  [id number], DOB:  1971/09/03, LOS: 21 ?ADMISSION DATE:  01/20/2022, CONSULTATION DATE: 01/20/2022 ?REFERRING MD: Triad, CHIEF COMPLAINT: Altered mental status ? ?History of Present Illness:  ?51 year old female who was found outside in her car by family on 4/8 and was moved to the house and left for 5 hours at which time she did not move. Question if she had seizures therefore emergency medical services was activated.  She is transferred to [facility] emergency department near obtunded has extremely loud respirations copious secretions and difficulty controlling her airway.  She has been seen by neurology.  She admitted to the intensive care unit for further evaluation and treatment. ? ?Pertinent  Medical History  ?HTN  ?TIA ? 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 Trach placed.  ?4/18 PEG placed. Spiking fever. Lactate spiked. Got fluid. Cleared.  ?4/22 Tolerated 6hrs of ATC day prior. No major events overnight, appears that stroke had [place] meeting with family 4/21 with no decision made ?4/23 No issues overnight, on vent this am, remains unresponsive  ?4/27 changed to propranolol from metoprolol for neuro-storming. Started on vanc and cefepime for possible HCAP  ?4/28 trial on open ended trach collar ? ?Interim History / Subjective:  ? ?Tolerating tracheostomy trial. Continues to be tachypneic intermittently. Still having protracted coughing jags but seem less frequent with initiation of gabapentin ? ?Objective   ?Blood pressure (!) 151/105, pulse (!) 101, temperature 100 ?F (37.8 ?C), temperature source Axillary, resp. rate (!) 26, height 5\' 4"  (1.626 m), weight 79.2 kg, SpO2 96 %. ?   ?FiO2 (%):  [40 %] 40 %  ? ?Intake/Output Summary (Last 24 hours)  at 02/10/2022 1110 ?Last data filed at 02/10/2022 0800 ?Gross per 24 hour  ?Intake 3709.76 ml  ?Output 1450 ml  ?Net 2259.76 ml  ? ? ?Filed Weights  ? 02/07/22 0447 02/09/22 0500 02/10/22 0500  ?Weight: 77.4 kg 78.9 kg 79.2 kg  ? ? ?Examination: ?General not much changed still on vent. Currently on trach collar ?HENT trach not remarkable. Tongue protruding and copious oral secretions. ?Pulm chest clear.  Continues to have episodes of tachypnea and coughing spells which subsided spontaneously. ?Card extremities warm well perfused.  Heart sounds are unremarkable. ?Abd soft and nontender with PEG tube in place ?Ext trace edema. ?Neuro strong cough but no response to pain.  Pupils are equal. ? ? 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 Putative artery of Percheron infarct. ? ?- On Plavix and Crestor.  ?- Limited improvement after 15 days signals potentially poor prognosis, although AoP strokes often improved over time. May take several months.  ?- Added amantadine ?- Wean Keppra as may be contributing to somnolence. ? ? ?Acute encephalopathy ?Related to multifocal CVA's and possible Wernicke's encephalopathy.  ? ?- Limited improvement after 15 days portends very poor prognosis especially given presence of brainstem findings.  ?- Palliative care consultation as patient is likely to be confined to long-term trach/vent facility.  ?- Family still requesting full scope of care.  ?- Have added amantadine ? ?Respiratory failure ([place]) ?Failure to wean from ventilator despite relatively normal chest X-ray.  CT angiography negative for pulmonary embolism.  Moderate WBCs with polymicrobial flora on respiratory culture. ?Suspect obesity  hypoventilation causing lung restriction. ? ?-Patient has variable respiratory effort irrespective of vent setting.   ?-Continues to tolerate open-ended trach collar trial.  Should be ready to transfer to stepdown  tomorrow. ?-Have added scopolamine to decrease oral secretions and potentially reduce coughing spells. ? ?Tracheostomy dependence ([place]) ?Tracheostomy placed 4/16.  ? ?- Routine trach care. Change tracheostomy q7days  ?- Continue with cuffed tracheostomy for mechanical ventilation if necessary ? ?PEG (percutaneous endoscopic gastrostomy) status ([place]) ?Placed 4/17  ? ?- Continue usual PEG care.  ? ?Antiphospholipid antibody with hypercoagulable state ([place]) ?Tests performed 4/16 ?Anticardiolipin IGM positve. Lupus anticoagulant PPT prolonged. ? ?- Currently on Plavix and prophylactic Lovenox.  ? ?Hypertension ?-Well controlled on amlodipine and propranolol ? ?Pressure injury of skin ?Labial injury ? ?- Wound care ?  ? ?Best Practice (right click and "Reselect all SmartList Selections" daily)  ?Diet/type: NPO with tube feeds ?DVT prophylaxis: LMWH ?GI prophylaxis: PPI ?Lines: N/A ?Foley:  N/A ?Code Status:  full code ?Last date of multidisciplinary goals of care discussion: ?Palliative care is following. ? ?Kipp Brood, MD FRCPC ?ICU Physician ?[place]  ?Pager: [phone] ?Or Epic Secure Chat ?After hours: [phone]. ? ?02/10/2022, 11:10 AM ? ? ? ? ? ? ? ? ?

## 2022-02-11 LAB — CBC
HCT: 34.7 % — ABNORMAL LOW (ref 36.0–46.0)
Hemoglobin: 11.4 g/dL — ABNORMAL LOW (ref 12.0–15.0)
MCH: 26.6 pg (ref 26.0–34.0)
MCHC: 32.9 g/dL (ref 30.0–36.0)
MCV: 80.9 fL (ref 80.0–100.0)
Platelets: 367 10*3/uL (ref 150–400)
RBC: 4.29 MIL/uL (ref 3.87–5.11)
RDW: 14.2 % (ref 11.5–15.5)
WBC: 10.2 10*3/uL (ref 4.0–10.5)
nRBC: 0 % (ref 0.0–0.2)

## 2022-02-11 LAB — GLUCOSE, CAPILLARY
Glucose-Capillary: 100 mg/dL — ABNORMAL HIGH (ref 70–99)
Glucose-Capillary: 120 mg/dL — ABNORMAL HIGH (ref 70–99)
Glucose-Capillary: 123 mg/dL — ABNORMAL HIGH (ref 70–99)
Glucose-Capillary: 126 mg/dL — ABNORMAL HIGH (ref 70–99)
Glucose-Capillary: 126 mg/dL — ABNORMAL HIGH (ref 70–99)
Glucose-Capillary: 131 mg/dL — ABNORMAL HIGH (ref 70–99)
Glucose-Capillary: 155 mg/dL — ABNORMAL HIGH (ref 70–99)

## 2022-02-11 LAB — BASIC METABOLIC PANEL
Anion gap: 8 (ref 5–15)
BUN: 19 mg/dL (ref 6–20)
CO2: 27 mmol/L (ref 22–32)
Calcium: 9 mg/dL (ref 8.9–10.3)
Chloride: 105 mmol/L (ref 98–111)
Creatinine, Ser: 0.67 mg/dL (ref 0.44–1.00)
GFR, Estimated: >60 mL/min (ref >60)
Glucose, Bld: 144 mg/dL — ABNORMAL HIGH (ref 70–99)
Potassium: 4.5 mmol/L (ref 3.5–5.1)
Sodium: 140 mmol/L (ref 135–145)

## 2022-02-11 MED ORDER — ALBUTEROL SULFATE (2.5 MG/3ML) 0.083% IN NEBU
2.5000 mg | INHALATION_SOLUTION | Freq: Two times a day (BID) | RESPIRATORY_TRACT | Status: AC
  Administered 2022-02-11 – 2022-02-13 (×5): 2.5 mg via RESPIRATORY_TRACT
  Filled 2022-02-11 (×5): qty 3

## 2022-02-11 MED ORDER — ALBUTEROL SULFATE (2.5 MG/3ML) 0.083% IN NEBU
2.5000 mg | INHALATION_SOLUTION | Freq: Two times a day (BID) | RESPIRATORY_TRACT | Status: DC
Start: 2022-02-11 — End: 2022-02-11

## 2022-02-11 MED ORDER — GUAIFENESIN 100 MG/5ML PO LIQD
10.0000 mL | Freq: Four times a day (QID) | ORAL | Status: DC
  Administered 2022-02-11 (×2): 10 mL via ORAL
  Filled 2022-02-11 (×2): qty 15

## 2022-02-11 MED ORDER — FREE WATER
250.0000 mL | Freq: Four times a day (QID) | Status: DC
  Administered 2022-02-11 – 2022-02-17 (×23): 250 mL

## 2022-02-11 MED ORDER — SODIUM CHLORIDE 3 % IN NEBU: 4.0000 mL | INHALATION_SOLUTION | Freq: Two times a day (BID) | RESPIRATORY_TRACT | Status: DC

## 2022-02-11 MED ORDER — GLYCOPYRROLATE 1 MG PO TABS
1.0000 mg | ORAL_TABLET | Freq: Three times a day (TID) | ORAL | Status: DC
  Administered 2022-02-11 – 2022-03-03 (×59): 1 mg
  Filled 2022-02-11 (×61): qty 1

## 2022-02-11 MED ORDER — FENTANYL CITRATE PF 50 MCG/ML IJ SOSY
12.5000 ug | PREFILLED_SYRINGE | INTRAMUSCULAR | Status: DC | PRN
  Administered 2022-02-11 – 2022-02-12 (×2): 25 ug via INTRAVENOUS
  Filled 2022-02-11 (×2): qty 1

## 2022-02-11 MED ORDER — GUAIFENESIN 100 MG/5ML PO LIQD
10.0000 mL | Freq: Four times a day (QID) | ORAL | Status: DC
  Administered 2022-02-12 – 2022-03-17 (×135): 10 mL
  Filled 2022-02-11 (×135): qty 10

## 2022-02-11 NOTE — Progress Notes (Addendum)
? ?NAME:  Cindy Brooks, MRN:  [id number], DOB:  July 17, 1971, LOS: 12 ?ADMISSION DATE:  01/20/2022, CONSULTATION DATE:  01/20/22 ?REFERRING MD:  Triad CHIEF COMPLAINT:  AMS  ? ?History of Present Illness:  ?51 year old female who was found outside in her car by family on 4/8 and was moved to the house and left for 5 hours at which time she did not move. Question if she had seizures therefore emergency medical services was activated.  She is transferred to [facility] emergency department near obtunded has extremely loud respirations copious secretions and difficulty controlling her airway.  She has been seen by neurology.  She admitted to the intensive care unit for further evaluation and treatment. ? ?Past Medical History:  ?HTN ?TIA ? ?Significant Hospital Events:  ?4/9 Admit  ?4/12 ongoing high dose thiamine for possible Wernike's encephalopathy  ?4/13 IV sedation stopped, phenobarbital wean ?4/17 still not waking up. Trach placed.  ?4/18 PEG placed. Spiking fever. Lactate spiked. Got fluid. Cleared.  ?4/22 Tolerated 6hrs of ATC day prior. No major events overnight, appears that stroke had [place] meeting with family 4/21 with no decision made ?4/23 No issues overnight, on vent this am, remains unresponsive  ?4/27 changed to propranolol from metoprolol for neuro-storming. Started on vanc and cefepime for possible HCAP  ?4/28 trial on open ended trach collar ?4/30 planning for trach exchange ? ?Interim History / Subjective:  ?No acute overnight events.  Remains unresponsive.  No coughing episodes seen on evaluation this morning. ? ?Objective   ?Blood pressure 119/75, pulse 92, temperature 99.8 ?F (37.7 ?C), temperature source Axillary, resp. rate 18, height 5\' 4"  (1.626 m), weight 79.2 kg, SpO2 96 %. ?   ?FiO2 (%):  [40 %] 40 %  ? ?Intake/Output Summary (Last 24 hours) at 02/11/2022 0951 ?Last data filed at 02/11/2022 0930 ?Gross per 24 hour  ?Intake 4880.36 ml  ?Output 1750 ml  ?Net 3130.36 ml  ? ?Filed Weights  ? 02/07/22  0447 02/09/22 0500 02/10/22 0500  ?Weight: 77.4 kg 78.9 kg 79.2 kg  ? ? ?Examination: ?General: Ill-appearing, unresponsive, on trach collar ?HENT: Tracheostomy site clean, copious oral secretions still present ?Lungs: Clear to auscultation bilaterally, intermittent tachypnea ?Cardiovascular: RRR, no murmurs rubs or gallop ?Abdomen: Soft, nontender, PEG tube in place ?Extremities: No edema ?Neuro: Unresponsive to painful stimuli ? ? ?Resolved Hospital Problem list   ? ? ?Assessment & Plan:  ?Thalamic stroke  ?Acute encephalopathy, multifactorial ?Warnicke's encephalopathy ?Bilateral thalamic, brainstem and diffuse watershed infarcts on MRI. Putative artery of Percheron infarct. She has unfortunately had limited improvement after 2 weeks which signals potentially poor prognosis. ?- Continue Plavix and Crestor ?-Continue amantadine ?-Continue Keppra, weaned as it may have been contributing to somnolence ?-Palliative care consulted as patient will likely need LTAC placement ?-Family continuing to request full scope of care ? ?Respiratory failure  ?Tracheostomy dependent ?Tracheostomy placed 4/16.  Failure to wean from ventilator despite relatively normal chest X-ray.  CT angiography negative for pulmonary embolism.  Moderate WBCs with polymicrobial flora on respiratory culture. Suspect obesity hypoventilation causing lung restriction.  Planning trach exchange today and can likely transfer out of the ICU. ?-Patient has variable respiratory effort irrespective of vent setting ?-Continues to tolerate open-ended trach collar trial ?-Scopolamine added to decrease oral secretions and potentially reduce coughing spells ?-Routine trach care. Change tracheostomy q7days  ?-Exchange to cuffless tracheostomy ?  ?PEG (percutaneous endoscopic gastrostomy) status  ?Placed 4/17  ?- Continue usual PEG care.  ?  ?Antiphospholipid antibody with hypercoagulable  state ?Tests performed 4/16. Anticardiolipin IGM positve. Lupus anticoagulant  PPT prolonged. ?- Currently on Plavix and prophylactic Lovenox ?  ?Hypertension ?-Well controlled on amlodipine and propranolol ?  ?Pressure injury of skin ?Labial injury ?- Wound care ? ?Best practice (evaluated daily)  ?Diet: NPO, tube feeds ?DVT prophylaxis: SCDs ?GI prophylaxis: PPI ?Glucose control: SSI ?Mobility: bedrest ?Disposition: can likely transfer out of ICU today ? ?Goals of Care:  ?Last date of multidisciplinary goals of care discussion:5/1 ?Family and staff present: no ?Summary of discussion: Lurline [place] exchange today, transfer out of ICU ?Follow up goals of care discussion due: 5/2 ?Code Status: Full ? ?Labs   ?CBC: ?Recent Labs  ?Lab 02/04/22 ?2224 02/07/22 ?[id number] 02/08/22 ?0444 02/11/22 ?[id number]  ?WBC 9.7 14.3* 15.5* 10.2  ?NEUTROABS 6.9 10.3*  --   --   ?HGB 10.6* 11.7* 11.9* 11.4*  ?HCT 33.0* 37.9 38.2 34.7*  ?MCV 84.2 84.2 85.3 80.9  ?PLT 329 424* 378 367  ? ? ?Basic Metabolic Panel: ?Recent Labs  ?Lab 02/07/22 ?[id number] 02/08/22 ?0444 02/08/22 ?1129 02/09/22 ?0221 02/11/22 ?[id number]  ?NA 151* 149* 149* 145 140  ?K 4.5 4.2 3.7 4.4 4.5  ?CL 111 111 113* 110 105  ?CO2 31 29 28 28 27   ?GLUCOSE 152* 122* 170* 140* 144*  ?BUN 35* 35* 34* 27* 19  ?CREATININE 1.07* 0.99 0.88 0.80 0.67  ?CALCIUM 9.8 9.5 9.4 9.4 9.0  ?MG 2.9*  --   --   --   --   ? ?GFR: ?Estimated Creatinine Clearance: 85.7 mL/min (by C-G formula based on SCr of 0.67 mg/dL). ?Recent Labs  ?Lab 02/04/22 ?2224 02/07/22 ?[id number] 02/08/22 ?0444 02/11/22 ?[id number]  ?WBC 9.7 14.3* 15.5* 10.2  ? ? ?Liver Function Tests: ?Recent Labs  ?Lab 02/04/22 ?2224  ?AST 43*  ?ALT 86*  ?ALKPHOS 68  ?BILITOT 0.2*  ?PROT 7.2  ?ALBUMIN 2.7*  ? ?No results for input(s): LIPASE, AMYLASE in the last 168 hours. ?No results for input(s): AMMONIA in the last 168 hours. ? ?ABG ?   ?Component Value Date/Time  ? PHART 7.406 01/21/2022 0354  ? PCO2ART 35.0 01/21/2022 0354  ? PO2ART 84 01/21/2022 0354  ? HCO3 22.3 01/21/2022 0354  ? TCO2 23 01/21/2022 0354  ? ACIDBASEDEF 2.0 01/21/2022 0354   ? O2SAT 97 01/21/2022 0354  ?  ? ?Coagulation Profile: ?No results for input(s): INR, PROTIME in the last 168 hours. ? ?Cardiac Enzymes: ?No results for input(s): CKTOTAL, CKMB, CKMBINDEX, TROPONINI in the last 168 hours. ? ?HbA1C: ?Hgb A1c MFr Bld  ?Date/Time Value Ref Range Status  ?01/22/2022 03:15 AM 5.7 (H) 4.8 - 5.6 % Final  ?  Comment:  ?  (NOTE) ?Pre diabetes:          5.7%-6.4% ? ?Diabetes:              >6.4% ? ?Glycemic control for   <7.0% ?adults with diabetes ?  ? ? ?CBG: ?Recent Labs  ?Lab 02/10/22 ?1505 02/10/22 ?1914 02/10/22 ?2310 02/11/22 ?0316 02/11/22 ?0746  ?GLUCAP 118* 144* 140* 126* 123*  ? ? ?Review of Systems:   ?Unable to obtain. ? ?Past Medical History:  ?She,  has a past medical history of Hypertension, Hypertensive urgency (01/20/2022), Seizure ([place]) (01/28/2022), TIA (transient ischemic attack) (2019), and Urinary tract infection (01/28/2022).  ? ?Surgical History:  ? ?Past Surgical History:  ?Procedure Laterality Date  ? ESOPHAGOGASTRODUODENOSCOPY N/A 01/28/2022  ? Procedure: ESOPHAGOGASTRODUODENOSCOPY (EGD);  Surgeon: Jesusita Oka, MD;  Location: [facility];  Service: General;  Laterality: N/A;  ? PEG PLACEMENT N/A 01/28/2022  ? Procedure: PERCUTANEOUS ENDOSCOPIC GASTROSTOMY (PEG) PLACEMENT;  Surgeon: Jesusita Oka, MD;  Location: [facility] ENDOS[facility];  Service: General;  Laterality: N/A;  ?  ? ?Social History:  ? reports that she has never smoked. She has never used smokeless tobacco. She reports current alcohol use of about 203.0 standard drinks per week. She reports that she does not use drugs.  ? ?Family History:  ?Her family history includes Diabetes Mellitus II in her maternal grandmother.  ? ?Allergies ?Allergies  ?Allergen Reactions  ? Aspirin Hives and Rash  ? Codeine Hives and Rash  ? Penicillins Anaphylaxis, Swelling and Other (See Comments)  ? Strawberry Extract Anaphylaxis and Hives  ?  ? ?Home Medications  ?Prior to Admission medications   ?Not on File  ?  ? ?Harlow Ohms,  DO ?PGY-3 IMTS  ? ? ?  ?

## 2022-02-11 NOTE — Progress Notes (Signed)
Changed H2O bottle out on patient's trach collar. ?

## 2022-02-12 DIAGNOSIS — R6889 Other general symptoms and signs: Secondary | ICD-10-CM

## 2022-02-12 LAB — GLUCOSE, CAPILLARY
Glucose-Capillary: 123 mg/dL — ABNORMAL HIGH (ref 70–99)
Glucose-Capillary: 131 mg/dL — ABNORMAL HIGH (ref 70–99)
Glucose-Capillary: 135 mg/dL — ABNORMAL HIGH (ref 70–99)
Glucose-Capillary: 164 mg/dL — ABNORMAL HIGH (ref 70–99)
Glucose-Capillary: 77 mg/dL (ref 70–99)
Glucose-Capillary: 90 mg/dL (ref 70–99)

## 2022-02-12 NOTE — Progress Notes (Signed)
Patient ID: Cindy Brooks, female   DOB: [birth year], 51 y.o.   MRN: [id number] ? ? ? ?Progress Note from the Palliative Medicine Team at [facility] ? ? ?Patient Name: Cindy Brooks        ?Date: 02/12/2022 ?DOB: [birth year]  Age: 51 y.o. MRN#: [id number] ?Attending Physician: [place] Eric J, DO ?Primary Care Physician: Pcp, No ?Admit Date: 01/20/2022 ? ? ?Medical records reviewed  ? ?51 y.o. female  admitted on  01/20/2022 having been   found outside in her car by family on 4/8 and was moved to the house and left for 5 hours at which time she did not move, transported by EMS to [facility] emergency room, patient was obtunded with loud respirations, copious secretions, difficulty protecting her airway, was intubated ?   ?  ?Significant Hospital events ?  ?4/9 Admit  ?     -CT head No acute abnormality.  ?      -CTA head & neck severe bilateral P1 and right P2 stenoses as well as proximal left  M2 stenosis and severe right ACA stenosis, 40% stenosis of left ICA ?     -MRI  Patchy restricted diffusion in bilateral thalami and midbrain with multiple     punctate acute ischemic infarcts in bilateral cerebral hemispheres and multiple chronic microhemorrhages ?      -Repeat MRI 4/19 multiple infarcts in bilateral frontal and parietal lobes, possibly in watershed distribution ?  ?  ?4/12 ongoing high dose thiamine for possible Wernike's encephalopathy  ?4/13 IV sedation stopped, phenobarbital wean ?4/17 still not waking up. Trach placed.  ?4/18 PEG placed. Spiking fever. Lactate spiked. Got fluid. Cleared.  ?4/22 Tolerated 6hrs of ATC day prior. No major events overnight, appears that stroke had [place] meeting with family 4/21 with no decision made ?4/27 remains on the , unsuccessful wean, remains unresponsive  ?4/28 trial on trach collar ?02/12/22 remains on trach collar, unresponsive to tactile stimulation, copious secretions  ? ?  ?Family face ongoing treatment option decisions, advanced directive decisions and anticipatory care  needs. ?  ?/I spoke to [employer] by telephone today.   ? ? ? ?Family verbalize hope for the future recognizing that patient is now on trach collar and off of the vent. ? ?Continued education regarding current medical situation and the unfortunate long-term poor prognosis.  Detailed conversation regarding the likely trajectory of a unresponsive, bedbound patient specifically as it relates to infection susceptibility and skin breakdown. ? ?Education offered on the difference between aggressive medical intervention path and a palliative comfort path for this patient at this time in this situation. ? ?Emotional support offered ? ? ?Plan of Care: ?-Full code ?-Family is open to all offered and available medical interventions to prolong life. ?-Family verbalized understanding that next steps in transition of care will be a skilled nursing facility for long-term care. ? ? ?I encouraged her children to continue conversations among themselves and with the medical team as they make decisions into the future and always keeping the patient at the center of those decisions. ? ?Questions and concerns addressed    ? ?PMT will continue to support holistically ? ?Wadie Lessen NP  ?Palliative Medicine Team Team Phone # [phone] ?Pager [phone] ?  ?

## 2022-02-12 NOTE — Progress Notes (Signed)
?PROGRESS NOTE ? ? ? ?Cindy Brooks  [id number] DOB: 20-Mar-1971 DOA: 01/20/2022 ?PCP: Pcp, No  ? ? ?Brief Narrative:  ? ?Cindy Brooks is a 51 year old female with past medical history significant for uncontrolled hypertension, history of TIA who presented to [facility] ED on 4/9 via EMS after being found unresponsive.  Patient was apparently found down by family outside approximately 08 100 and was moved into the home by family.  Patient continued to be unresponsive located in the same position and thus EMS was called at 2345 after no change in mentation.  Per daughter, patient did not have any new medications and denied any illicit drug use.  Daughter reported that she drinks alcohol at least 4-5 times a week.  She noted that patient has not complained of any chest pain or shortness of breath prior and no recent travel. ? ?Upon arrival to the ED, patient was found to be encephalopathic confused with agitation, blood pressure was markedly elevated with SBP 200 with urinalysis concerning for UTI.  Glucose was 57 and patient was given D50 and started on D5 normal saline after thiamine was given by ED physician.  CT head without contrast was negative, UDS negative, chest x-ray with cardiomegaly.  Patient was initially admitted to the hospital service for acute encephalopathy and neurology was consulted.  Additionally, given that patient was not obtunded with loud respirations and code Speirs secretions with difficulty controlling her airway, PCCM was consulted and patient was transferred to the intensive care unit and subsequently intubated on 4/9. EEG with moderate to severe diffuse encephalopathy that is nonspecific, no seizure or epileptiform discharges noted.  MR brain 4/11 with patchy restricted diffusion involving both thalami and midbrain likely reflecting an acute ischemic nonhemorrhagic artery of Percheron infarct, no mass effect, multiple additional punctate acute to subacute nonhemorrhagic ischemic infarcts  involving both cerebral hemispheres, scattered chronic microhemorrhages deep gray nuclei and cerebrum likely related to chronic poorly controlled hypertension.  CT angiogram/venogram head/neck 4/11 with severe bilateral P1 and right P2 PCA stenosis, bilateral ophthalmic infarcts, severe left proximal M2 MCA branch stenosis, severe right A2 ACA stenosis, 40% stenosis left carotid bifurcation in the neck, no evidence of dural venous sinus thrombosis.  Carotid ultrasound 4/11 with 1-39% stenosis bilateral ICA with antegrade flow in vertebral arteries.  Patient received high-dose IV thiamine for possible Warnicke's encephalopathy.  Was started on phenobarbital which was weaned off.  Patient was still obtunded and tracheostomy was placed on 4/17.  PEG tube placed on 4/18.  Palliative care was consulted and family requesting continued full support.  Patient was weaned off of vent and tolerating trach collar.  Lurline Idol was exchanged on 4/30.  Patient is pending LTAC placement.  Patient was transferred to the hospitalist service on 5/2. ? ?Assessment & Plan: ?  ? ?Acute metabolic encephalopathy, POA ?Thalamic CVA ?Wernicke's encephalopathy ?Patient presenting to the ED via EMS after being found unresponsive by family members.  Work-up revealing bilateral thalami, brainstem and diffuse watershed infarcts on MRI with putative artery of Percheron infarct.  Patient required intubation and ventilatory support due to being obtunded.  She has had a fortunately limited improvement during her hospitalization such far which portrays to a very poor prognosis.  Palliative care following, patient's family wishes to continue aggressive measures at this time.  Tracheostomy was placed on 4/17 and exchanged on 4/30.  PEG tube placed on 4/18. ?--PCCM following for trach care ?--Palliative care following ?--Continue Plavix, Crestor 40 mg p.o. daily ?--Continue amantadine ?--Continue Keppra wean ?--  Will need LTAC placement ?--Very poor/grim  prognosis unfortunately ? ?Acute respiratory failure ?Tracheostomy dependent ?HCAP ?On arrival to the ED, patient with snoring respirations, significant secretions and obtunded and was intubated for airway protection.  Patient was unable to be weaned from the ventilator and tracheostomy was placed on 4/16.  CTA negative for pulmonary embolism. ?--PCCM following for trach care ?--Continue cefepime, plan 7-day course ?--Scopolamine patch ?--RT for routine trach care ?--Currently on 10 L trach collar ? ?Dysphagia secondary to mental status ?PEG tube placed 4/17 by general surgery.  Continue tube feeds ? ?Antiphospholipid antibody with hypercoagulable state ?Anticardiolipin IgM positive, lupus anticoagulant PTT prolonged. ?--Plavix and Lovenox ? ?Essential hypertension ?--Amlodipine ?--Propranolol ? ?Pressure injury of skin ?Labile injury ?--Continue local wound care ? ? ? ?DVT prophylaxis: SCDs Start: 01/20/22 1012 ? ?  Code Status: Full Code ?Family Communication: No family present at bedside ? ?Disposition Plan:  ?Level of care: Progressive ?Status is: Inpatient ?Remains inpatient appropriate because: Awaiting LTAC placement ?  ? ?Consultants:  ?PCCM, now following for trach care ?Neurology -signed off 4/21 ?Palliative care ?General surgery for EGD/PEG tube placement ? ?Procedures:  ?EEG ?Bronchoscopy 4/16 Dr. Carlis Abbott ?Percutaneous tracheostomy with bronchoscopic guidance, Dr. Tacy Learn 4/16 ?EGD and PEG placement, general surgery, Dr. Bobbye Morton, 4/17 ? ?Antimicrobials:  ?Cefepime 4/27>> ?Vancomycin 4/27 - 4/28 ? ? ? ? ?Subjective: ?Patient seen examined bedside, lying in bed.  No family present.  Remains unresponsive on 10 L trach collar.  Noted continues with excessive secretions.  Discussed with RN at bedside.  Ultimately very poor prognosis has patient without much change since initial presentation.  Awaiting LTAC placement.  Overall poor/grim prognosis. ? ?Objective: ?Vitals:  ? 02/12/22 0500 02/12/22 0729 02/12/22 0800  02/12/22 0840  ?BP:  120/87 140/89   ?Pulse:  (!) 107 (!) 102   ?Resp:  (!) 26 (!) 59   ?Temp:  97.6 ?F (36.4 ?C)    ?TempSrc:  Axillary    ?SpO2:  90% 93% 99%  ?Weight: 79.2 kg     ?Height:      ? ? ?Intake/Output Summary (Last 24 hours) at 02/12/2022 0941 ?Last data filed at 02/12/2022 0800 ?Gross per 24 hour  ?Intake 1680.5 ml  ?Output 2000 ml  ?Net -319.5 ml  ? ?Filed Weights  ? 02/09/22 0500 02/10/22 0500 02/12/22 0500  ?Weight: 78.9 kg 79.2 kg 79.2 kg  ? ? ?Examination: ? ?Physical Exam: ?GEN: Ill-appearing, unresponsive, on trach collar with significant secretions noted ?HEENT: Tracheostomy noted, on trach collar, copious oral secretions ?PULM: Breath sounds slightly decreased bilateral bases, no wheezes/crackles, intermittent tachypnea, on 10 L trach collar ?CV: RRR w/o M/G/R ?GI: abd soft, nondistended, PEG tube noted with tube feeds infusing ?MSK: no peripheral edema, ?NEURO: Unresponsive/obtunded ?PSYCH: Unable to assess due to mental status ? ? ? ?Data Reviewed: I have personally reviewed following labs and imaging studies ? ?CBC: ?Recent Labs  ?Lab 02/07/22 ?[id number] 02/08/22 ?0444 02/11/22 ?[id number]  ?WBC 14.3* 15.5* 10.2  ?NEUTROABS 10.3*  --   --   ?HGB 11.7* 11.9* 11.4*  ?HCT 37.9 38.2 34.7*  ?MCV 84.2 85.3 80.9  ?PLT 424* 378 367  ? ?Basic Metabolic Panel: ?Recent Labs  ?Lab 02/07/22 ?[id number] 02/08/22 ?0444 02/08/22 ?1129 02/09/22 ?0221 02/11/22 ?[id number]  ?NA 151* 149* 149* 145 140  ?K 4.5 4.2 3.7 4.4 4.5  ?CL 111 111 113* 110 105  ?CO2 31 29 28 28 27   ?GLUCOSE 152* 122* 170* 140* 144*  ?BUN 35* 35* 34* 27* 19  ?  CREATININE 1.07* 0.99 0.88 0.80 0.67  ?CALCIUM 9.8 9.5 9.4 9.4 9.0  ?MG 2.9*  --   --   --   --   ? ?GFR: ?Estimated Creatinine Clearance: 85.7 mL/min (by C-G formula based on SCr of 0.67 mg/dL). ?Liver Function Tests: ?No results for input(s): AST, ALT, ALKPHOS, BILITOT, PROT, ALBUMIN in the last 168 hours. ?No results for input(s): LIPASE, AMYLASE in the last 168 hours. ?No results for input(s): AMMONIA in  the last 168 hours. ?Coagulation Profile: ?No results for input(s): INR, PROTIME in the last 168 hours. ?Cardiac Enzymes: ?No results for input(s): CKTOTAL, CKMB, CKMBINDEX, TROPONINI in the last 168 hours. ?

## 2022-02-13 LAB — GLUCOSE, CAPILLARY
Glucose-Capillary: 133 mg/dL — ABNORMAL HIGH (ref 70–99)
Glucose-Capillary: 138 mg/dL — ABNORMAL HIGH (ref 70–99)
Glucose-Capillary: 151 mg/dL — ABNORMAL HIGH (ref 70–99)
Glucose-Capillary: 153 mg/dL — ABNORMAL HIGH (ref 70–99)
Glucose-Capillary: 165 mg/dL — ABNORMAL HIGH (ref 70–99)
Glucose-Capillary: 168 mg/dL — ABNORMAL HIGH (ref 70–99)

## 2022-02-13 NOTE — Progress Notes (Signed)
Nutrition Follow-up ? ?DOCUMENTATION CODES:  ? ?Not applicable ? ?INTERVENTION:  ? ?Continue tube feeds via PEG tube: ?- Jevity 1.5 @ 55 ml/hr (1320 ml/day) ?- ProSource TF 45 ml daily ?- Free water flushes of 250 ml q 6 hours ? ?Tube feeding regimen provides 2020 kcal, 95 grams of protein, and 1003 ml of H2O. ? ?Total free water with flushes: 2003 ml ? ?- MVI with minerals daily per tube ? ?NUTRITION DIAGNOSIS:  ? ?Inadequate oral intake related to inability to eat as evidenced by NPO status. ? ?Ongoing ? ?GOAL:  ? ?Patient will meet greater than or equal to 90% of their needs ? ?Met via TF at goal ? ?MONITOR:  ? ?Labs, Weight trends, TF tolerance, Skin ? ?REASON FOR ASSESSMENT:  ? ?Ventilator, Consult ?Enteral/tube feeding initiation and management ? ?ASSESSMENT:  ? ?51 year old female who presented to the ED on 4/09 with AMS. PMH of EtOH abuse, HTN, TIA, medication noncompliance. Pt admitted with acute encephalopathy. ? ?04/09 - pt intubated for airway protection ?04/17 - s/p trach ?04/18 - s/p PEG ?04/19 - pt with new watershed infarcts in B frontal, B parietal, and L temporal lobes ? ?Pt now tolerating trach collar and was transferred out of the ICU. Met with pt at bedside. Tube feeds infusing at goal rate via PEG. Discussed pt with RN. No tolerance issues noted. Will continue with current tube feeding regimen at this time. ? ?Current TF: Jevity 1.5 @ 55 ml/hr, ProSource TF 45 ml daily, free water flushes 250 ml q 6 hours ? ?Admit weight: 78.7 kg ?Current weight: 84.6 kg ? ?Medications reviewed and include: colace, folic acid, SSI q 4 hours, novolog 4 units q 4 hours, MVI with minerals daily, scopolamine patch, senna, thiamine, IV abx ? ?Vitamin/Mineral Profile: ?Vitamin B6: 4.6 (WNL) ?Vitamin B12: 930 (H) ?Vitamin A: 44.9 (WNL) ?Zinc: 69 (WNL)  ?CRP: 7.6 (H) ?Vitamin B1: 365.8 (H) ? ?Labs reviewed: WBC 11.0 ?CBG's: 133-168 x 24 hours ? ?UOP: 1200 ml x 24 hours ? ?Diet Order:   ?Diet Order   ? ?       ?  Diet  NPO time specified  Diet effective now       ?  ? ?  ?  ? ?  ? ? ?EDUCATION NEEDS:  ? ?No education needs have been identified at this time ? ?Skin:  Skin Assessment: ?Skin Integrity Issues: ?Stage II: labia ? ?Last BM:  02/13/22 ? ?Height:  ? ?Ht Readings from Last 1 Encounters:  ?01/20/22 _0  (1.626 m)  ? ? ?Weight:  ? ?Wt Readings from Last 1 Encounters:  ?02/14/22 84.6 kg  ? ? ?BMI:  Body mass index is 32.01 kg/m?. ? ?Estimated Nutritional Needs:  ? ?Kcal:  1800-2000 ? ?Protein:  90-110 grams ? ?Fluid:  1.8-2.0 L ? ? ? ?Gustavus Bryant, MS, RD, LDN ?Inpatient Clinical Dietitian ?Please see AMiON for contact information. ? ?

## 2022-02-13 NOTE — Progress Notes (Signed)
? ? ? [facility] ?                                                                            ? ? ?Cindy Brooks, is a 51 y.o. female, DOB - [birth year], QMV:[id number] ?Admit date - 01/20/2022    ?Outpatient Primary MD for the patient is Pcp, No ? ?LOS - 24  days ? ? ? ?Brief summary  ? ?Cindy Brooks is a 51 year old female with past medical history significant for uncontrolled hypertension, history of TIA who presented to [facility] ED on 4/9 via EMS after being found unresponsive.  Patient was apparently found down by family outside approximately 08 100 and was moved into the home by family.  Patient continued to be unresponsive located in the same position and thus EMS was called at 2345 after no change in mentation.  Per daughter, patient did not have any new medications and denied any illicit drug use.  Daughter reported that she drinks alcohol at least 4-5 times a week.  She noted that patient has not complained of any chest pain or shortness of breath prior and no recent travel. ?  ?Upon arrival to the ED, patient was found to be encephalopathic confused with agitation, blood pressure was markedly elevated with SBP 200 with urinalysis concerning for UTI.  Glucose was 57 and patient was given D50 and started on D5 normal saline after thiamine was given by ED physician.  CT head without contrast was negative, UDS negative, chest x-ray with cardiomegaly.  Patient was initially admitted to the hospital service for acute encephalopathy and neurology was consulted.  Additionally, given that patient was not obtunded with loud respirations and code Speirs secretions with difficulty controlling her airway, PCCM was consulted and patient was transferred to the intensive care unit and subsequently intubated on 4/9. EEG with moderate to severe diffuse encephalopathy that is nonspecific, no seizure or epileptiform discharges noted.  MR brain 4/11 with patchy restricted diffusion involving both thalami and midbrain  likely reflecting an acute ischemic nonhemorrhagic artery of Percheron infarct, no mass effect, multiple additional punctate acute to subacute nonhemorrhagic ischemic infarcts involving both cerebral hemispheres, scattered chronic microhemorrhages deep gray nuclei and cerebrum likely related to chronic poorly controlled hypertension.  CT angiogram/venogram head/neck 4/11 with severe bilateral P1 and right P2 PCA stenosis, bilateral ophthalmic infarcts, severe left proximal M2 MCA branch stenosis, severe right A2 ACA stenosis, 40% stenosis left carotid bifurcation in the neck, no evidence of dural venous sinus thrombosis.  Carotid ultrasound 4/11 with 1-39% stenosis bilateral ICA with antegrade flow in vertebral arteries.  Patient received high-dose IV thiamine for possible Warnicke's encephalopathy.  Was started on phenobarbital which was weaned off.  Patient was still obtunded and tracheostomy was placed on 4/17.  PEG tube placed on 4/18.  Palliative care was consulted and family requesting continued full support.  Patient was weaned off of vent and tolerating trach collar.  Lurline Idol was exchanged on 4/30.  Patient is pending LTAC placement.  Patient was transferred to the hospitalist service on 5/2. ?  ? ?Assessment & Plan  ? ? ?Assessment and Plan: ? ? ?Acute metabolic encephalopathy, POA ?Thalamic CVA ?Wernicke's encephalopathy ?Patient presenting to the ED  via EMS after being found unresponsive by family members.  Work-up revealing bilateral thalami, brainstem and diffuse watershed infarcts on MRI with putative artery of Percheron infarct.  Patient required intubation and ventilatory support due to being obtunded.  She has had a fortunately limited improvement during her hospitalization such far which portrays to a very poor prognosis.  Palliative care following, patient's family wishes to continue aggressive measures at this time.  Tracheostomy was placed on 4/17 and exchanged on 4/30.  PEG tube placed on  4/18. ?--PCCM following for trach care ?--Palliative care following ?--Continue Plavix, Crestor 40 mg p.o. daily ?--Continue amantadine ?--Continue Keppra wean ?--Will need LTAC placement ?--Very poor/grim prognosis unfortunately ?  ?Acute respiratory failure ?Tracheostomy dependent ?HCAP ?On arrival to the ED, patient with snoring respirations, significant secretions and obtunded and was intubated for airway protection.  Patient was unable to be weaned from the ventilator and tracheostomy was placed on 4/16.  CTA negative for pulmonary embolism. ?--PCCM following for trach care ?--Continue cefepime, plan 7-day course ?--Scopolamine patch ?--RT for routine trach care ?--Currently on 10 L trach collar ?  ?Dysphagia secondary to mental status ?PEG tube placed 4/17 by general surgery.  Continue tube feeds ?  ?Antiphospholipid antibody with hypercoagulable state ?Anticardiolipin IgM positive, lupus anticoagulant PTT prolonged. ?--Plavix and Lovenox ?  ?Essential hypertension ?--Amlodipine ?--Propranolol ?  ?Pressure injury of skin ?Labile injury ?--Continue local wound care ?  ?  ?  ? ? ?RN Pressure Injury Documentation: ?Pressure Injury 01/31/22 Labia Bilateral Stage 2 -  Partial thickness loss of dermis presenting as a shallow open injury with a red, pink wound bed without slough. purewick device related skin tear, bilat lower labia (Active)  ?01/31/22 2200  ?Location: Labia  ?Location Orientation: Bilateral  ?Staging: Stage 2 -  Partial thickness loss of dermis presenting as a shallow open injury with a red, pink wound bed without slough.  ?Wound Description (Comments): purewick device related skin tear, bilat lower labia  ?Present on Admission:   ?Dressing Type None 02/11/22 2000  ?Local wound care.  ? ?Malnutrition Type: ? ?Nutrition Problem: Inadequate oral intake ?Etiology: inability to eat ? ? ?Malnutrition Characteristics: ? ?Signs/Symptoms: NPO status ? ? ?Nutrition Interventions: ? ?Interventions: Prostat,  MVI, Tube feeding ? ?Estimated body mass index is 31.79 kg/m? as calculated from the following: ?  Height as of this encounter: 5' 4"  (1.626 m). ?  Weight as of this encounter: 84 kg. ? ?Code Status: full code.  ?DVT Prophylaxis:  SCDs Start: 01/20/22 1012 ? ? ?Level of Care: Level of care: Progressive ?Family Communication: None at bedside. ? ?Disposition Plan:     Remains inpatient appropriate:  needs LTAC.  ? ?Procedures:  ?Trach.  ?Consultants:   ?PCCM.  ? ?Antimicrobials:  ? ?Anti-infectives (From admission, onward)  ? ? Start     Dose/Rate Route Frequency Ordered Stop  ? 02/08/22 1800  vancomycin (VANCOREADY) IVPB 1500 mg/300 mL  Status:  Discontinued       ? 1,500 mg ?150 mL/hr over 120 Minutes Intravenous Every 24 hours 02/07/22 1712 02/09/22 1046  ? 02/07/22 1800  vancomycin (VANCOREADY) IVPB 1750 mg/350 mL       ? 1,750 mg ?175 mL/hr over 120 Minutes Intravenous  Once 02/07/22 1708 02/07/22 2046  ? 02/07/22 1700  ceFEPIme (MAXIPIME) 2 g in sodium chloride 0.9 % 100 mL IVPB       ? 2 g ?200 mL/hr over 30 Minutes Intravenous Every 8 hours 02/07/22 1706 02/14/22 1714  ?  01/20/22 2000  cefTRIAXone (ROCEPHIN) 2 g in sodium chloride 0.9 % 100 mL IVPB       ? 2 g ?200 mL/hr over 30 Minutes Intravenous Every 24 hours 01/20/22 0634 01/24/22 2022  ? 01/20/22 0415  cefTRIAXone (ROCEPHIN) 2 g in sodium chloride 0.9 % 100 mL IVPB       ? 2 g ?200 mL/hr over 30 Minutes Intravenous  Once 01/20/22 0414 01/20/22 0537  ? ?  ? ? ? ?Medications ? ?Scheduled Meds: ? albuterol  2.5 mg Nebulization BID  ? amantadine  100 mg Per Tube BID  ? amLODipine  10 mg Per Tube Daily  ? chlorhexidine gluconate (MEDLINE KIT)  15 mL Mouth Rinse BID  ? Chlorhexidine Gluconate Cloth  6 each Topical Daily  ? clopidogrel  75 mg Per Tube Daily  ? docusate  100 mg Per Tube BID  ? feeding supplement (PROSource TF)  45 mL Per Tube Daily  ? folic acid  1 mg Per Tube Daily  ? free water  250 mL Per Tube Q6H  ? gabapentin  300 mg Per Tube BID  ?  glycopyrrolate  1 mg Per Tube TID  ? guaiFENesin  10 mL Per Tube Q6H  ? insulin aspart  0-15 Units Subcutaneous Q4H  ? insulin aspart  4 Units Subcutaneous Q4H  ? levETIRAcetam  500 mg Per Tube BID  ? mouth rinse  15 mL

## 2022-02-14 LAB — CBC
HCT: 32.6 % — ABNORMAL LOW (ref 36.0–46.0)
Hemoglobin: 10.7 g/dL — ABNORMAL LOW (ref 12.0–15.0)
MCH: 26.4 pg (ref 26.0–34.0)
MCHC: 32.8 g/dL (ref 30.0–36.0)
MCV: 80.5 fL (ref 80.0–100.0)
Platelets: 322 10*3/uL (ref 150–400)
RBC: 4.05 MIL/uL (ref 3.87–5.11)
RDW: 14.5 % (ref 11.5–15.5)
WBC: 11 10*3/uL — ABNORMAL HIGH (ref 4.0–10.5)
nRBC: 0 % (ref 0.0–0.2)

## 2022-02-14 LAB — BASIC METABOLIC PANEL
Anion gap: 9 (ref 5–15)
BUN: 17 mg/dL (ref 6–20)
CO2: 24 mmol/L (ref 22–32)
Calcium: 9.4 mg/dL (ref 8.9–10.3)
Chloride: 106 mmol/L (ref 98–111)
Creatinine, Ser: 0.65 mg/dL (ref 0.44–1.00)
GFR, Estimated: >60 mL/min (ref >60)
Glucose, Bld: 146 mg/dL — ABNORMAL HIGH (ref 70–99)
Potassium: 4.3 mmol/L (ref 3.5–5.1)
Sodium: 139 mmol/L (ref 135–145)

## 2022-02-14 LAB — GLUCOSE, CAPILLARY
Glucose-Capillary: 143 mg/dL — ABNORMAL HIGH (ref 70–99)
Glucose-Capillary: 146 mg/dL — ABNORMAL HIGH (ref 70–99)
Glucose-Capillary: 137 mg/dL — ABNORMAL HIGH (ref 70–99)
Glucose-Capillary: 160 mg/dL — ABNORMAL HIGH (ref 70–99)
Glucose-Capillary: 141 mg/dL — ABNORMAL HIGH (ref 70–99)
Glucose-Capillary: 140 mg/dL — ABNORMAL HIGH (ref 70–99)

## 2022-02-14 NOTE — [place] FL2 (Addendum)
?[place] MEDICAID FL2 LEVEL OF CARE SCREENING TOOL  ?  ? ?IDENTIFICATION  ?Patient Name: ?Cindy Brooks Birthdate: 04/23/1971 Sex: female Admission Date (Current Location): ?01/20/2022  ?[place] and [place] Number: ? [place] ?  Facility and Address:  ?[facility] [place] [facility], [place] [address] [place], [place] [address] ?     Provider Number: ?[id number]  ?Attending Physician Name and Address:  ?Hosie Poisson, MD ? Relative Name and Phone Number:  ?Jourdin Gens (Daughter) [phone] ?   ?Current Level of Care: ?Hospital Recommended Level of Care: ?[place] Prior Approval Number: ?  ? ?Date Approved/Denied: ?  PASRR Number: ?[id number] A ? ?Discharge Plan: ?SNF ?  ? ?Current Diagnoses: ?Patient Active Problem List  ? Diagnosis Date Noted  ? HCAP (healthcare-associated pneumonia) 02/08/2022  ? Hypernatremia 02/07/2022  ? Coma ([place]) 02/07/2022  ? Fever 02/07/2022  ? Leukocytosis 02/07/2022  ? Sympathetic storming 02/07/2022  ? Antiphospholipid antibody with hypercoagulable state ([place]) 02/04/2022  ? Pressure injury of skin 02/01/2022  ? PEG (percutaneous endoscopic gastrostomy) status ([place]) 01/29/2022  ? Respiratory failure ([place]) 01/28/2022  ? 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 Hypertension   ? Thalamic stroke ([place]) 01/22/2022  ? Acute encephalopathy 01/20/2022  ? ? ?Orientation RESPIRATION BLADDER Height & Weight   ?  ? (Unable to follow commands; responds to pain) ? Tracheostomy (2m Shiley cuffed trach ; vent 40% FiO2) Incontinent, External catheter Weight: 186 lb 8.2 oz (84.6 kg) ?Height:  5' 4"  (162.6 cm)  ?BEHAVIORAL SYMPTOMS/MOOD NEUROLOGICAL BOWEL NUTRITION STATUS  ?    Incontinent Feeding tube  ?AMBULATORY STATUS COMMUNICATION OF NEEDS Skin ?pressure injury;labia stage 2, wound/incision skin tear right back, wound/incision skin tear vetebral column left   ?Extensive Assist Does not communicate Normal ?  ?  ?  ?    ?     ?     ? ? ?Personal Care Assistance Level of  Assistance  ?Total care Bathing Assistance: Maximum assistance ?Feeding assistance: Maximum assistance ?Dressing Assistance: Maximum assistance ?   ? ?Functional Limitations Info  ?Sight, Hearing, Speech Sight Info: Adequate ?Hearing Info: Adequate ?Speech info: does not communicate  ? ? ?SPECIAL CARE FACTORS FREQUENCY  ?PT (By licensed PT), OT (By licensed OT)   ?  ?PT Frequency: 2x per week ?OT Frequency: 2x per week ?  ?  ?  ?   ? ? ?Contractures Contractures Info: Not present  ? ? ?Additional Factors Info  ?Code Status, Allergies, Insulin Sliding Scale Code Status Info: Full code ?Allergies Info: Aspirin, Codeine, Penicillins, Strawberry Extract ?  ?Insulin Sliding Scale Info: insulin aspart (novoLOG) injection 0-15 Units ?  ?   ? ?Current Medications (02/14/2022):  This is the current hospital active medication list ?Current Facility-Administered Medications  ?Medication Dose Route Frequency Provider Last Rate Last Admin  ? 0.9 %  sodium chloride infusion   Intravenous PRN XRosalin Hawking MD   Stopped at 02/11/22 [phone] ? acetaminophen (TYLENOL) 160 MG/5ML solution 650 mg  650 mg Per Tube Q6H PRN SAnders Simmonds MD   650 mg at 02/08/22 1234  ? albuterol (PROVENTIL) (2.5 MG/3ML) 0.083% nebulizer solution 2.5 mg  2.5 mg Nebulization BID CNoemi ChapelP, DO   2.5 mg at 02/13/22 1938  ? amantadine (SYMMETREL) 50 MG/5ML solution 100 mg  100 mg Per Tube BID AKipp Brood MD   100 mg at 02/13/22 2140  ? amLODipine (NORVASC) tablet 10 mg  10 mg Per Tube Daily CJacky Kindle MD   10  mg at 02/14/22 0829  ? chlorhexidine gluconate (MEDLINE KIT) (PERIDEX) 0.12 % solution 15 mL  15 mL Mouth Rinse BID Olalere, Adewale A, MD   15 mL at 02/13/22 2148  ? Chlorhexidine Gluconate Cloth 2 % PADS 6 each  6 each Topical Daily Olalere, Adewale A, MD   6 each at 02/12/22 2245  ? clopidogrel (PLAVIX) tablet 75 mg  75 mg Per Tube Daily Jacky Kindle, MD   75 mg at 02/14/22 0830  ? docusate (COLACE) 50 MG/5ML liquid 100 mg  100 mg Per Tube  BID Spero Geralds, MD   100 mg at 02/14/22 0623  ? feeding supplement (JEVITY 1.5 CAL/FIBER) liquid 1,000 mL  1,000 mL Per Tube Continuous Kipp Brood, MD 55 mL/hr at 02/13/22 2156 1,000 mL at 02/13/22 2156  ? feeding supplement (PROSource TF) liquid 45 mL  45 mL Per Tube Daily Spero Geralds, MD   45 mL at 02/14/22 7628  ? fentaNYL (SUBLIMAZE) injection 12.5-25 mcg  12.5-25 mcg Intravenous Q2H PRN Julian Hy, DO   25 mcg at 02/12/22 0031  ? folic acid (FOLVITE) tablet 1 mg  1 mg Per Tube Daily Spero Geralds, MD   1 mg at 02/14/22 3151  ? free water 250 mL  250 mL Per Tube Q6H Rehman, Areeg N, DO   250 mL at 02/14/22 0610  ? gabapentin (NEURONTIN) capsule 300 mg  300 mg Per Tube BID Kipp Brood, MD   300 mg at 02/14/22 0829  ? glycopyrrolate (ROBINUL) tablet 1 mg  1 mg Per Tube TID Noemi Chapel P, DO   1 mg at 02/14/22 0830  ? guaiFENesin (ROBITUSSIN) 100 MG/5ML liquid 10 mL  10 mL Per Tube Q6H Noemi Chapel P, DO   10 mL at 02/14/22 7616  ? insulin aspart (novoLOG) injection 0-15 Units  0-15 Units Subcutaneous Q4H Elmer Sow, MD   2 Units at 02/14/22 0825  ? insulin aspart (novoLOG) injection 4 Units  4 Units Subcutaneous Q4H Kipp Brood, MD   4 Units at 02/14/22 0826  ? labetalol (NORMODYNE) injection 10 mg  10 mg Intravenous Q2H PRN Chesley Mires, MD   10 mg at 02/06/22 0749  ? MEDLINE mouth rinse  15 mL Mouth Rinse 10 times per day Sherrilyn Rist A, MD   15 mL at 02/14/22 0650  ? metoprolol tartrate (LOPRESSOR) injection 2.5 mg  2.5 mg Intravenous Q6H PRN Gerald Leitz D, NP   2.5 mg at 02/07/22 0844  ? multivitamin with minerals tablet 1 tablet  1 tablet Per Tube Daily Spero Geralds, MD   1 tablet at 02/14/22 0737  ? polyethylene glycol (MIRALAX / GLYCOLAX) packet 17 g  17 g Per Tube Daily PRN Spero Geralds, MD      ? propranolol (INDERAL) tablet 40 mg  40 mg Per Tube TID Erick Colace, NP   40 mg at 02/14/22 1062  ? rosuvastatin (CRESTOR) tablet 40 mg  40 mg Per Tube Daily  Janine Ores, NP   40 mg at 02/14/22 6948  ? scopolamine (TRANSDERM-SCOP) 1 MG/3DAYS 1.5 mg  1 patch Transdermal Q72H Kipp Brood, MD   1.5 mg at 02/13/22 0934  ? senna (SENOKOT) tablet 8.6 mg  1 tablet Per Tube Daily Agarwala, Einar Grad, MD   8.6 mg at 02/14/22 0829  ? thiamine tablet 100 mg  100 mg Per Tube Daily Garvin Fila, MD   100 mg at 02/14/22 5462  ? ? ? ?  Discharge Medications: ?Please see discharge summary for a list of discharge medications. ? ?Relevant Imaging Results: ? ?Relevant Lab Results: ? ? ?Additional Information ?SS# [id number]. No COVID immunizations on file. ? ?Vinie Sill, LCSW ? ? ? ? ?

## 2022-02-14 NOTE — Progress Notes (Signed)
? ? ? [facility] ?                                                                            ? ? ?Cindy Brooks, is a 51 y.o. female, DOB - [birth year], [id number] ?Admit date - 01/20/2022    ?Outpatient Primary MD for the patient is Pcp, No ? ?LOS - 25  days ? ? ? ?Brief summary  ? ?Cindy Brooks is a 51 year old female with past medical history significant for uncontrolled hypertension, history of TIA who presented to [facility] ED on 4/9 via EMS after being found unresponsive.  Patient was apparently found down by family outside approximately 08 100 and was moved into the home by family.  Patient continued to be unresponsive located in the same position and thus EMS was called at 2345 after no change in mentation.  Per daughter, patient did not have any new medications and denied any illicit drug use.  Daughter reported that she drinks alcohol at least 4-5 times a week.  She noted that patient has not complained of any chest pain or shortness of breath prior and no recent travel. ?  ?Upon arrival to the ED, patient was found to be encephalopathic confused with agitation, blood pressure was markedly elevated with SBP 200 with urinalysis concerning for UTI.  Glucose was 57 and patient was given D50 and started on D5 normal saline after thiamine was given by ED physician.  CT head without contrast was negative, UDS negative, chest x-ray with cardiomegaly.  Patient was initially admitted to the hospital service for acute encephalopathy and neurology was consulted.  Additionally, given that patient was not obtunded with loud respirations and code Speirs secretions with difficulty controlling her airway, PCCM was consulted and patient was transferred to the intensive care unit and subsequently intubated on 4/9. EEG with moderate to severe diffuse encephalopathy that is nonspecific, no seizure or epileptiform discharges noted.  MR brain 4/11 with patchy restricted diffusion involving both thalami and midbrain  likely reflecting an acute ischemic nonhemorrhagic artery of Percheron infarct, no mass effect, multiple additional punctate acute to subacute nonhemorrhagic ischemic infarcts involving both cerebral hemispheres, scattered chronic microhemorrhages deep gray nuclei and cerebrum likely related to chronic poorly controlled hypertension.  CT angiogram/venogram head/neck 4/11 with severe bilateral P1 and right P2 PCA stenosis, bilateral ophthalmic infarcts, severe left proximal M2 MCA branch stenosis, severe right A2 ACA stenosis, 40% stenosis left carotid bifurcation in the neck, no evidence of dural venous sinus thrombosis.  Carotid ultrasound 4/11 with 1-39% stenosis bilateral ICA with antegrade flow in vertebral arteries.  Patient received high-dose IV thiamine for possible Warnicke's encephalopathy.  Was started on phenobarbital which was weaned off.  Patient was still obtunded and tracheostomy was placed on 4/17.  PEG tube placed on 4/18.  Palliative care was consulted and family requesting continued full support.  Patient was weaned off of vent and tolerating trach collar.  Lurline Idol was exchanged on 4/30.  Patient is pending LTAC placement.  Patient was transferred to the hospitalist service on 5/2. ?  ? ?Assessment & Plan  ? ? ?Assessment and Plan: ? ? ?Acute metabolic encephalopathy, POA ?Thalamic CVA ?Wernicke's encephalopathy ?Patient presenting to the ED  via EMS after being found unresponsive by family members.  Work-up showing bilateral thalamic, brainstem and diffuse watershed infarcts on MRI. ?On admission patient required intubation and ventilatory support.  Patient was unable to be extubated and underwent tracheostomy on 01/28/2022 and exchanged on 02/10/2022. ? PEG tube placed on 4/18.  PCCM following for trach care.  Continue with Plavix, Crestor. ?Continue with amantadine and Keppra.  Patient has poor prognosis unfortunately. ? ?Acute respiratory failure ?Tracheostomy dependent ?HCAP ?Patient was intubated  on admission and was unable to be weaned off the ventilator.  Patient underwent tracheostomy on 01/28/2022. ?PCCM on board for trach care. Completed 7 days of cefepime. On scopolamine patch.  ?Currently on 7 L trach collar ?  ?Dysphagia secondary to mental status ?PEG tube placed  by general surgery.  Continue tube feeds ?  ?Antiphospholipid antibody with hypercoagulable state ?Anticardiolipin IgM positive, lupus anticoagulant PTT prolonged. ?Resume plavix.  ?  ?Essential hypertension ?BP parameters are optimal.  ? ? ?Anemia of chronic disease:  ?Hemoglobin stable around 10.7 ? ? ?Pressure injury of skin ?Labile injury ?RN Pressure Injury Documentation: ?Pressure Injury 01/31/22 [facility] Bilateral Stage 2 -  Partial thickness loss of dermis presenting as a shallow open injury with a red, pink wound bed without slough. purewick device related skin tear, bilat lower labia (Active)  ?01/31/22 2200  ?Location: [facility]  ?Location Orientation: Bilateral  ?Staging: Stage 2 -  Partial thickness loss of dermis presenting as a shallow open injury with a red, pink wound bed without slough.  ?Wound Description (Comments): purewick device related skin tear, bilat lower labia  ?Present on Admission:   ?Dressing Type None 02/11/22 2000  ?Local wound care.  ? ?Malnutrition Type: ? ?Nutrition Problem: Inadequate oral intake ?Etiology: inability to eat ? ? ?Malnutrition Characteristics: ? ?Signs/Symptoms: NPO status ? ? ?Nutrition Interventions: ? ?Interventions: Prostat, MVI, Tube feeding ? ?Estimated body mass index is 32.01 kg/m? as calculated from the following: ?  Height as of this encounter: 5' 4"  (1.626 m). ?  Weight as of this encounter: 84.6 kg. ? ?Code Status: full code.  ?DVT Prophylaxis:  SCDs Start: 01/20/22 1012 ? ? ?Level of Care: Level of care: Progressive ?Family Communication: None at bedside. ? ?Disposition Plan:     Remains inpatient appropriate:  needs LTAC.  ? ?Procedures:  ?Trach.  ?Consultants:   ?PCCM.   ? ?Antimicrobials:  ? ?Anti-infectives (From admission, onward)  ? ? Start     Dose/Rate Route Frequency Ordered Stop  ? 02/08/22 1800  vancomycin (VANCOREADY) IVPB 1500 mg/300 mL  Status:  Discontinued       ? 1,500 mg ?150 mL/hr over 120 Minutes Intravenous Every 24 hours 02/07/22 1712 02/09/22 1046  ? 02/07/22 1800  vancomycin (VANCOREADY) IVPB 1750 mg/350 mL       ? 1,750 mg ?175 mL/hr over 120 Minutes Intravenous  Once 02/07/22 1708 02/07/22 2046  ? 02/07/22 1700  ceFEPIme (MAXIPIME) 2 g in sodium chloride 0.9 % 100 mL IVPB       ? 2 g ?200 mL/hr over 30 Minutes Intravenous Every 8 hours 02/07/22 1706 02/14/22 0856  ? 01/20/22 2000  cefTRIAXone (ROCEPHIN) 2 g in sodium chloride 0.9 % 100 mL IVPB       ? 2 g ?200 mL/hr over 30 Minutes Intravenous Every 24 hours 01/20/22 0634 01/24/22 2022  ? 01/20/22 0415  cefTRIAXone (ROCEPHIN) 2 g in sodium chloride 0.9 % 100 mL IVPB       ? 2 g ?200  mL/hr over 30 Minutes Intravenous  Once 01/20/22 0414 01/20/22 0537  ? ?  ? ? ? ?Medications ? ?Scheduled Meds: ? albuterol  2.5 mg Nebulization BID  ? amantadine  100 mg Per Tube BID  ? amLODipine  10 mg Per Tube Daily  ? chlorhexidine gluconate (MEDLINE KIT)  15 mL Mouth Rinse BID  ? Chlorhexidine Gluconate Cloth  6 each Topical Daily  ? clopidogrel  75 mg Per Tube Daily  ? docusate  100 mg Per Tube BID  ? feeding supplement (PROSource TF)  45 mL Per Tube Daily  ? folic acid  1 mg Per Tube Daily  ? free water  250 mL Per Tube Q6H  ? gabapentin  300 mg Per Tube BID  ? glycopyrrolate  1 mg Per Tube TID  ? guaiFENesin  10 mL Per Tube Q6H  ? insulin aspart  0-15 Units Subcutaneous Q4H  ? insulin aspart  4 Units Subcutaneous Q4H  ? mouth rinse  15 mL Mouth Rinse 10 times per day  ? multivitamin with minerals  1 tablet Per Tube Daily  ? propranolol  40 mg Per Tube TID  ? rosuvastatin  40 mg Per Tube Daily  ? scopolamine  1 patch Transdermal Q72H  ? senna  1 tablet Per Tube Daily  ? thiamine  100 mg Per Tube Daily  ? ?Continuous  Infusions: ? sodium chloride Stopped (02/11/22 0928)  ? feeding supplement (JEVITY 1.5 CAL/FIBER) 1,000 mL (02/13/22 2156)  ? ?PRN Meds:.sodium chloride, acetaminophen (TYLENOL) oral liquid 160 mg/5 mL, fentaNYL (SUBLIMA

## 2022-02-15 ENCOUNTER — Inpatient Hospital Stay (HOSPITAL_COMMUNITY): Payer: No Typology Code available for payment source

## 2022-02-15 LAB — GLUCOSE, CAPILLARY
Glucose-Capillary: 141 mg/dL — ABNORMAL HIGH (ref 70–99)
Glucose-Capillary: 148 mg/dL — ABNORMAL HIGH (ref 70–99)
Glucose-Capillary: 156 mg/dL — ABNORMAL HIGH (ref 70–99)
Glucose-Capillary: 143 mg/dL — ABNORMAL HIGH (ref 70–99)
Glucose-Capillary: 181 mg/dL — ABNORMAL HIGH (ref 70–99)
Glucose-Capillary: 184 mg/dL — ABNORMAL HIGH (ref 70–99)
Glucose-Capillary: 157 mg/dL — ABNORMAL HIGH (ref 70–99)

## 2022-02-15 MED ORDER — HYDRALAZINE HCL 25 MG PO TABS
25.0000 mg | ORAL_TABLET | Freq: Three times a day (TID) | ORAL | Status: DC
  Administered 2022-02-15 – 2022-03-05 (×54): 25 mg
  Filled 2022-02-15 (×54): qty 1

## 2022-02-15 NOTE — Progress Notes (Signed)
? ? ? [facility] ?                                                                            ? ? ?Cindy Brooks, is a 51 y.o. female, DOB - Feb 17, 1971, [id number] ?Admit date - 01/20/2022    ?Outpatient Primary MD for the patient is Pcp, No ? ?LOS - 26  days ? ? ? ?Brief summary  ? ?Cindy Brooks is a 51 year old female with past medical history significant for uncontrolled hypertension, history of TIA who presented to [facility] ED on 4/9 via EMS after being found unresponsive.  Patient was apparently found down by family outside approximately 08 100 and was moved into the home by family.  Patient continued to be unresponsive located in the same position and thus EMS was called at 2345 after no change in mentation.  Per daughter, patient did not have any new medications and denied any illicit drug use.  Daughter reported that she drinks alcohol at least 4-5 times a week.  She noted that patient has not complained of any chest pain or shortness of breath prior and no recent travel. ?  ?Upon arrival to the ED, patient was found to be encephalopathic confused with agitation, blood pressure was markedly elevated with SBP 200 with urinalysis concerning for UTI.  Glucose was 57 and patient was given D50 and started on D5 normal saline after thiamine was given by ED physician.  CT head without contrast was negative, UDS negative, chest x-ray with cardiomegaly.  Patient was initially admitted to the hospital service for acute encephalopathy and neurology was consulted.  Additionally, given that patient was not obtunded with loud respirations and code Speirs secretions with difficulty controlling her airway, PCCM was consulted and patient was transferred to the intensive care unit and subsequently intubated on 4/9. EEG with moderate to severe diffuse encephalopathy that is nonspecific, no seizure or epileptiform discharges noted.  MR brain 4/11 with patchy restricted diffusion involving both thalami and midbrain  likely reflecting an acute ischemic nonhemorrhagic artery of Percheron infarct, no mass effect, multiple additional punctate acute to subacute nonhemorrhagic ischemic infarcts involving both cerebral hemispheres, scattered chronic microhemorrhages deep gray nuclei and cerebrum likely related to chronic poorly controlled hypertension.  CT angiogram/venogram head/neck 4/11 with severe bilateral P1 and right P2 PCA stenosis, bilateral ophthalmic infarcts, severe left proximal M2 MCA branch stenosis, severe right A2 ACA stenosis, 40% stenosis left carotid bifurcation in the neck, no evidence of dural venous sinus thrombosis.  Carotid ultrasound 4/11 with 1-39% stenosis bilateral ICA with antegrade flow in vertebral arteries.  Patient received high-dose IV thiamine for possible Warnicke's encephalopathy.  Was started on phenobarbital which was weaned off.  Patient was still obtunded and tracheostomy was placed on 4/17.  PEG tube placed on 4/18.  Palliative care was consulted and family requesting continued full support.  Patient was weaned off of vent and tolerating trach collar.  Lurline Idol was exchanged on 4/30.  Patient is pending LTAC placement.  Patient was transferred to the hospitalist service on 5/2. ?  ? ?Assessment & Plan  ? ? ?Assessment and Plan: ? ? ?Acute metabolic encephalopathy, POA ?Thalamic CVA ?Wernicke's encephalopathy ?Patient presenting to the ED  via EMS after being found unresponsive by family members.  Work-up showing bilateral thalamic, brainstem and diffuse watershed infarcts on MRI. ?On admission patient required intubation and ventilatory support.  ? Patient was unable to be extubated and underwent tracheostomy on 01/28/2022 and exchanged on 02/10/2022. ? PEG tube placed on 4/18.  PCCM following for trach care.   ?CXR on 5/5 shows Hypoinflation of the lungs with mild bibasilar subsegmental ?atelectasis. ?Continue with Plavix, Crestor. ?Continue with amantadine 100 mg BID . ?She was weaned off keppra.   ?Limited improvement potential poor prognosis unfortunately. ?Family requesting full scope of care.  ? ?Acute respiratory failure ?Tracheostomy dependent ?HCAP/  ?Obesity hypoventilation syndrome  ?Patient was intubated on admission and was unable to be weaned off the ventilator.  Patient underwent tracheostomy on 01/28/2022. ?PCCM on board for trach care. Completed 7 days of cefepime. On scopolamine patch.  ?Currently on 7 L trach collar.  ?Cxr Hypoinflation of the lungs with mild bibasilar subsegmental atelectasis. ?  ?Dysphagia secondary to mental status ?PEG tube placed  by general surgery.  Continue tube feeds ?  ?Antiphospholipid antibody with hypercoagulable state ?Anticardiolipin IgM positive, lupus anticoagulant PTT prolonged. ?Resume plavix  and prophylactic lovenox.  ?  ?Essential hypertension ?BP parameters are optimal.  ?Resume amlodipine and propranolol.  ? ? ?Anemia of chronic disease:  ?Hemoglobin stable around 10.7 ? ?In view of poor prognosis udue to multifocal CVA'S and wernicke's encephalopathy,  palliative care consulted ? ? ?Pressure injury of skin ?Labile injury ?RN Pressure Injury Documentation: ?Pressure Injury 01/31/22 [facility] Bilateral Stage 2 -  Partial thickness loss of dermis presenting as a shallow open injury with a red, pink wound bed without slough. purewick device related skin tear, bilat lower labia (Active)  ?01/31/22 2200  ?Location: [facility]  ?Location Orientation: Bilateral  ?Staging: Stage 2 -  Partial thickness loss of dermis presenting as a shallow open injury with a red, pink wound bed without slough.  ?Wound Description (Comments): purewick device related skin tear, bilat lower labia  ?Present on Admission:   ?Dressing Type None 02/11/22 2000  ?Local wound care.  ? ?Malnutrition Type: ? ?Nutrition Problem: Inadequate oral intake ?Etiology: inability to eat ? ? ?Malnutrition Characteristics: ? ?Signs/Symptoms: NPO status ? ? ?Nutrition Interventions: ? ?Interventions: Prostat,  MVI, Tube feeding ? ?Estimated body mass index is 31.56 kg/m? as calculated from the following: ?  Height as of this encounter: 5\' 4"  (1.626 m). ?  Weight as of this encounter: 83.4 kg. ? ?Code Status: full code.  ?DVT Prophylaxis:  SCDs Start: 01/20/22 1012 ? ? ?Level of Care: Level of care: Progressive ?Family Communication: None at bedside. Family would like to meet on Sunday.  ? ?Disposition Plan:     Remains inpatient appropriate:  SNF/ LTAC. ? ? ?[place].  ?4/9 Admit  ?4/12 ongoing high dose thiamine for possible Wernike's encephalopathy  ?4/13 IV sedation stopped, phenobarbital wean ?4/17 still not waking up. Trach placed.  ?4/18 PEG placed. Spiking fever. Lactate spiked. Got fluid. Cleared.  ?4/22 Tolerated 6hrs of ATC day prior. No major events overnight, appears that stroke had [place] meeting with family 4/21 with no decision made ?4/23 No issues overnight, on vent this am, remains unresponsive  ?4/27 changed to propranolol from metoprolol for neuro-storming. Started on vanc and cefepime for possible HCAP  ?4/28 trial on open ended trach collar ?5/2 patient transferred to [facility].  ?Procedures:  ?Trach.  ?S/P peg ?Consultants:   ?PCCM.  ? ?Antimicrobials:  ? ?Anti-infectives (  From admission, onward)  ? ? Start     Dose/Rate Route Frequency Ordered Stop  ? 02/08/22 1800  vancomycin (VANCOREADY) IVPB 1500 mg/300 mL  Status:  Discontinued       ? 1,500 mg ?150 mL/hr over 120 Minutes Intravenous Every 24 hours 02/07/22 1712 02/09/22 1046  ? 02/07/22 1800  vancomycin (VANCOREADY) IVPB 1750 mg/350 mL       ? 1,750 mg ?175 mL/hr over 120 Minutes Intravenous  Once 02/07/22 1708 02/07/22 2046  ? 02/07/22 1700  ceFEPIme (MAXIPIME) 2 g in sodium chloride 0.9 % 100 mL IVPB       ? 2 g ?200 mL/hr over 30 Minutes Intravenous Every 8 hours 02/07/22 1706 02/14/22 0856  ? 01/20/22 2000  cefTRIAXone (ROCEPHIN) 2 g in sodium chloride 0.9 % 100 mL IVPB       ? 2 g ?200 mL/hr over 30 Minutes  Intravenous Every 24 hours 01/20/22 0634 01/24/22 2022  ? 01/20/22 0415  cefTRIAXone (ROCEPHIN) 2 g in sodium chloride 0.9 % 100 mL IVPB       ? 2 g ?200 mL/hr over 30 Minutes Intravenous  Once 01/20/22 0414 0

## 2022-02-16 LAB — CBC
HCT: 34 % — ABNORMAL LOW (ref 36.0–46.0)
Hemoglobin: 11.4 g/dL — ABNORMAL LOW (ref 12.0–15.0)
MCH: 26.9 pg (ref 26.0–34.0)
MCHC: 33.5 g/dL (ref 30.0–36.0)
MCV: 80.2 fL (ref 80.0–100.0)
Platelets: 312 10*3/uL (ref 150–400)
RBC: 4.24 MIL/uL (ref 3.87–5.11)
RDW: 14.7 % (ref 11.5–15.5)
WBC: 12.5 10*3/uL — ABNORMAL HIGH (ref 4.0–10.5)
nRBC: 0 % (ref 0.0–0.2)

## 2022-02-16 LAB — BASIC METABOLIC PANEL
Anion gap: 8 (ref 5–15)
BUN: 15 mg/dL (ref 6–20)
CO2: 28 mmol/L (ref 22–32)
Calcium: 9.4 mg/dL (ref 8.9–10.3)
Chloride: 99 mmol/L (ref 98–111)
Creatinine, Ser: 0.56 mg/dL (ref 0.44–1.00)
GFR, Estimated: >60 mL/min (ref >60)
Glucose, Bld: 100 mg/dL — ABNORMAL HIGH (ref 70–99)
Potassium: 4.3 mmol/L (ref 3.5–5.1)
Sodium: 135 mmol/L (ref 135–145)

## 2022-02-16 LAB — GLUCOSE, CAPILLARY
Glucose-Capillary: 133 mg/dL — ABNORMAL HIGH (ref 70–99)
Glucose-Capillary: 143 mg/dL — ABNORMAL HIGH (ref 70–99)
Glucose-Capillary: 168 mg/dL — ABNORMAL HIGH (ref 70–99)
Glucose-Capillary: 129 mg/dL — ABNORMAL HIGH (ref 70–99)
Glucose-Capillary: 172 mg/dL — ABNORMAL HIGH (ref 70–99)
Glucose-Capillary: 140 mg/dL — ABNORMAL HIGH (ref 70–99)

## 2022-02-16 NOTE — Progress Notes (Signed)
? ? ? [facility] ?                                                                            ? ? ?Cindy Brooks, is a 51 y.o. female, DOB - 11-May-1971, [id number] ?Admit date - 01/20/2022    ?Outpatient Primary MD for the patient is Pcp, No ? ?LOS - 27  days ? ? ? ?Brief summary  ? ?Cindy Brooks is a 51 year old female with past medical history significant for uncontrolled hypertension, history of TIA who presented to [facility] ED on 4/9 via EMS after being found unresponsive.  Patient was apparently found down by family outside approximately 08 100 and was moved into the home by family.  Patient continued to be unresponsive located in the same position and thus EMS was called at 2345 after no change in mentation.  Per daughter, patient did not have any new medications and denied any illicit drug use.  Daughter reported that she drinks alcohol at least 4-5 times a week.  She noted that patient has not complained of any chest pain or shortness of breath prior and no recent travel. ?  ?Upon arrival to the ED, patient was found to be encephalopathic confused with agitation, blood pressure was markedly elevated with SBP 200 with urinalysis concerning for UTI.  Glucose was 57 and patient was given D50 and started on D5 normal saline after thiamine was given by ED physician.  CT head without contrast was negative, UDS negative, chest x-ray with cardiomegaly.  Patient was initially admitted to the hospital service for acute encephalopathy and neurology was consulted.  Additionally, given that patient was not obtunded with loud respirations and code [place] secretions with difficulty controlling her airway, PCCM was consulted and patient was transferred to the intensive care unit and subsequently intubated on 4/9.  ?EEG with moderate to severe diffuse encephalopathy that is nonspecific, no seizure or epileptiform discharges. ? MR brain 4/11 with patchy restricted diffusion involving both thalami and midbrain likely  reflecting an acute ischemic nonhemorrhagic artery of Percheron infarct, no mass effect, multiple additional punctate acute to subacute nonhemorrhagic ischemic infarcts involving both cerebral hemispheres, scattered chronic microhemorrhages deep gray nuclei and cerebrum likely related to chronic poorly controlled hypertension.  ? CT angiogram/venogram head/neck 4/11 with severe bilateral P1 and right P2 PCA stenosis, bilateral ophthalmic infarcts, severe left proximal M2 MCA branch stenosis, severe right A2 ACA stenosis, 40% stenosis left carotid bifurcation in the neck, no evidence of dural venous sinus thrombosis.  ? Carotid ultrasound 4/11 with 1-39% stenosis bilateral ICA with antegrade flow in vertebral arteries.   ?Patient received high-dose IV thiamine for possible Warnicke's encephalopathy.  Was started on phenobarbital which was weaned off.  ? Patient was still obtunded and tracheostomy was placed on 4/17.  PEG tube placed on 4/18.  ? Palliative care was consulted and family requesting continued full support.   ?Patient was weaned off of vent and tolerating trach collar.  Lurline Idol was exchanged on 4/30.  ? Patient is pending LTAC placement.  ? Patient was transferred to the hospitalist service on 5/2. ?Patient seen and examined and no change in her mental status in the last 4 days.  ?  ? ?  Assessment & Plan  ? ? ?Assessment and Plan: ? ? ?Acute metabolic encephalopathy, POA ?Thalamic CVA ?Wernicke's encephalopathy ?Patient presenting to the ED via EMS after being found unresponsive by family members.  Work-up showing bilateral thalamic, brainstem and diffuse watershed infarcts on MRI. ?On admission patient required intubation and ventilatory support.  ? Patient was unable to be extubated and underwent tracheostomy on 01/28/2022 and exchanged on 02/10/2022. ? PEG tube placed on 4/18.  PCCM following for trach care.   ?CXR on 5/5 shows Hypoinflation of the lungs with mild bibasilar  subsegmental ?atelectasis. ?Continue with Plavix, Crestor. ?Continue with amantadine 100 mg BID . ?She was weaned off keppra.  ?Limited improvement potential poor prognosis unfortunately. ?Family requesting full scope of care.  ?Pts mental status did not improve in the last 4 days.  ? ?Acute respiratory failure ?Tracheostomy dependent ?HCAP/ Obesity hypoventilation syndrome  ?Patient was intubated on admission and was unable to be weaned off the ventilator.  Patient underwent tracheostomy on 01/28/2022. ?PCCM on board for trach care. Completed 7 days of cefepime. On scopolamine patch.  ?Currently on 7 L trach collar.  ?Cxr:  Hypoinflation of the lungs with mild bibasilar subsegmental atelectasis. ?  ?Dysphagia secondary to mental status ?PEG tube placed  by general surgery.  Continue tube feeds ?  ?Antiphospholipid antibody with hypercoagulable state ?Anticardiolipin IgM positive, lupus anticoagulant PTT prolonged. ?Resume plavix  and prophylactic lovenox.  ?  ?Essential hypertension ?BP parameters are well controlled.  ?Resume amlodipine and propranolol.  ? ? ?Anemia of chronic disease:  ?Hemoglobin stable around 10.7 ? ?In view of poor prognosis udue to multifocal CVA'S and wernicke's encephalopathy,  palliative care consulted ? ? ?Pressure injury of skin ?Labile injury ?RN Pressure Injury Documentation: ?Pressure Injury 01/31/22 [facility] Bilateral Stage 2 -  Partial thickness loss of dermis presenting as a shallow open injury with a red, pink wound bed without slough. purewick device related skin tear, bilat lower labia (Active)  ?01/31/22 2200  ?Location: [facility]  ?Location Orientation: Bilateral  ?Staging: Stage 2 -  Partial thickness loss of dermis presenting as a shallow open injury with a red, pink wound bed without slough.  ?Wound Description (Comments): purewick device related skin tear, bilat lower labia  ?Present on Admission:   ?Dressing Type None 02/11/22 2000  ?Local wound care.  ? ?Malnutrition  Type: ? ?Nutrition Problem: Inadequate oral intake ?Etiology: inability to eat ? ? ?Malnutrition Characteristics: ? ?Signs/Symptoms: NPO status ? ? ?Nutrition Interventions: ? ?Interventions: Prostat, MVI, Tube feeding ? ?Estimated body mass index is 29.82 kg/m? as calculated from the following: ?  Height as of this encounter: 5\' 4"  (1.626 m). ?  Weight as of this encounter: 78.8 kg. ? ?Code Status: full code.  ?DVT Prophylaxis:  SCDs Start: 01/20/22 1012 ? ? ?Level of Care: Level of care: Progressive ?Family Communication: None at bedside. Family would like to meet on Sunday.  ? ?Disposition Plan:     Remains inpatient appropriate:  SNF/ LTAC. ? ? ?[place].  ?4/9 Admit  ?4/12 ongoing high dose thiamine for possible Wernike's encephalopathy  ?4/13 IV sedation stopped, phenobarbital wean ?4/17 still not waking up. Trach placed.  ?4/18 PEG placed. Spiking fever. Lactate spiked. Got fluid. Cleared.  ?4/22 Tolerated 6hrs of ATC day prior. No major events overnight, appears that stroke had [place] meeting with family 4/21 with no decision made ?4/23 No issues overnight, on vent this am, remains unresponsive  ?4/27 changed to propranolol from metoprolol for neuro-storming. Started on  vanc and cefepime for possible HCAP  ?4/28 trial on open ended trach collar ?5/2 patient transferred to [facility].  ?Procedures:  ?Trach.  ?S/P peg ?Consultants:   ?PCCM.  ? ?Antimicrobials:  ? ?Anti-infectives (From admission, onward)  ? ? Start     Dose/Rate Route Frequency Ordered Stop  ? 02/08/22 1800  vancomycin (VANCOREADY) IVPB 1500 mg/300 mL  Status:  Discontinued       ? 1,500 mg ?150 mL/hr over 120 Minutes Intravenous Every 24 hours 02/07/22 1712 02/09/22 1046  ? 02/07/22 1800  vancomycin (VANCOREADY) IVPB 1750 mg/350 mL       ? 1,750 mg ?175 mL/hr over 120 Minutes Intravenous  Once 02/07/22 1708 02/07/22 2046  ? 02/07/22 1700  ceFEPIme (MAXIPIME) 2 g in sodium chloride 0.9 % 100 mL IVPB       ? 2 g ?200  mL/hr over 30 Minutes Intravenous Every 8 hours 02/07/22 1706 02/14/22 0856  ? 01/20/22 2000  cefTRIAXone (ROCEPHIN) 2 g in sodium chloride 0.9 % 100 mL IVPB       ? 2 g ?200 mL/hr over 30 Minutes Intravenous Every 24 hours 01/20/22 0634 01/24/22

## 2022-02-16 NOTE — Progress Notes (Signed)
Attempted to wean pt to 21% O2 on ATC, pt's SATs decreased to 85% and sustained. Pt placed back on 28% and RN aware. RRT will continue to monitor. ?

## 2022-02-17 LAB — URINALYSIS, ROUTINE W REFLEX MICROSCOPIC
Bilirubin Urine: NEGATIVE
Glucose, UA: NEGATIVE mg/dL
Hgb urine dipstick: NEGATIVE
Ketones, ur: NEGATIVE mg/dL
Nitrite: NEGATIVE
Protein, ur: NEGATIVE mg/dL
Specific Gravity, Urine: 1.013 (ref 1.005–1.030)
WBC, UA: >50 WBC/hpf — ABNORMAL HIGH (ref 0–5)
pH: 6 (ref 5.0–8.0)

## 2022-02-17 LAB — GLUCOSE, CAPILLARY
Glucose-Capillary: 120 mg/dL — ABNORMAL HIGH (ref 70–99)
Glucose-Capillary: 125 mg/dL — ABNORMAL HIGH (ref 70–99)
Glucose-Capillary: 139 mg/dL — ABNORMAL HIGH (ref 70–99)
Glucose-Capillary: 143 mg/dL — ABNORMAL HIGH (ref 70–99)
Glucose-Capillary: 156 mg/dL — ABNORMAL HIGH (ref 70–99)
Glucose-Capillary: 173 mg/dL — ABNORMAL HIGH (ref 70–99)

## 2022-02-17 MED ORDER — FREE WATER
250.0000 mL | Status: DC
  Administered 2022-02-17 – 2022-02-19 (×12): 250 mL

## 2022-02-17 MED ORDER — IPRATROPIUM-ALBUTEROL 0.5-2.5 (3) MG/3ML IN SOLN: 3.0000 mL | Freq: Four times a day (QID) | RESPIRATORY_TRACT | Status: DC | PRN

## 2022-02-17 NOTE — Progress Notes (Addendum)
? ? ? [facility] ?                                                                            ? ? ?Cindy Brooks, is a 51 y.o. female, DOB - [birth year], [id number] ?Admit date - 01/20/2022    ?Outpatient Primary MD for the patient is Pcp, No ? ?LOS - 28  days ? ? ? ?Brief summary  ? ?Cindy Brooks is a 51 year old female with past medical history significant for uncontrolled hypertension, history of TIA who presented to [facility] ED on 4/9 via EMS after being found unresponsive.  Patient was apparently found unresponsive by family, EMS was called and brought to ED.  ?  ?Upon arrival to the ED, patient was found to be encephalopathic confused with agitation, blood pressure was markedly elevated with SBP 200 with urinalysis concerning for UTI.  Glucose was 57 and patient was given D50 and started on D5 normal saline after thiamine was given by ED physician.   ?CT head without contrast was negative, UDS negative, chest x-ray with cardiomegaly.  Patient was initially admitted to the hospital service for acute encephalopathy and neurology was consulted.  ? Additionally, given that patient was not obtunded with loud respirations and copious secretions with difficulty controlling her airway and clearing out secretions, PCCM was consulted and patient was transferred to the intensive care unit and subsequently intubated on 4/9.  ?EEG with moderate to severe diffuse encephalopathy that is nonspecific, no seizure or epileptiform discharges. ? MR brain 4/11 with patchy restricted diffusion involving both thalami and midbrain likely reflecting an acute ischemic nonhemorrhagic artery of Percheron infarct, no mass effect, multiple additional punctate acute to subacute nonhemorrhagic ischemic infarcts involving both cerebral hemispheres, scattered chronic microhemorrhages deep gray nuclei and cerebrum likely related to chronic poorly controlled hypertension.  ? CT angiogram/venogram head/neck 4/11 with severe bilateral P1  and right P2 PCA stenosis, bilateral ophthalmic infarcts, severe left proximal M2 MCA branch stenosis, severe right A2 ACA stenosis, 40% stenosis left carotid bifurcation in the neck, no evidence of dural venous sinus thrombosis.  ? Carotid ultrasound 4/11 with 1-39% stenosis bilateral ICA with antegrade flow in vertebral arteries.   ?Patient received high-dose IV thiamine for possible Warnicke's encephalopathy.  Was started on phenobarbital which was weaned off.  ? Patient was still obtunded and tracheostomy was placed on 4/17.  PEG tube placed on 4/18.  ? Palliative care was consulted and family requesting continued full support.   ?Patient was weaned off of vent and tolerating trach collar.  Lurline Idol was exchanged on 4/30.  ? Patient is pending LTAC placement.  ? Patient was transferred to the hospitalist service on 5/2. ?Patient seen and examined this afternoon, she has her right eye opened and blinking her eyes.  ?  ? ?Assessment & Plan  ? ? ?Assessment and Plan: ? ? ?Acute metabolic encephalopathy, POA ?Thalamic CVA ?Wernicke's encephalopathy ?Patient presenting to the ED via EMS after being found unresponsive by family members.  Work-up showing bilateral thalamic, brainstem and diffuse watershed infarcts on MRI.  ?Patient remained obtunded, unable to clear secretions and maintian airway,  required intubation and ventilatory support.  ? Patient was unable to be extubated and  underwent tracheostomy on 01/28/2022 and exchanged on 02/10/2022. ? PEG tube placed on 4/18.  PCCM following for trach care.   ?CXR on 5/5 shows Hypoinflation of the lungs with mild bibasilar subsegmental ?atelectasis. ?Continue with Plavix, Crestor. ?Continue with amantadine 100 mg BID . ?She was weaned off keppra.  ?Limited improvement , poor prognosis unfortunately. ?Family requesting full scope of care.  ?She is responding to tactile stimulation and opened her right eye.  ? ?Acute respiratory failure ?Tracheostomy dependent ?HCAP/ Obesity  hypoventilation syndrome  ?Patient was intubated on admission and was unable to be weaned off the ventilator.  Patient underwent tracheostomy on 01/28/2022. ?PCCM on board for trach care. Completed 7 days of cefepime. On scopolamine patch.  ?Currently on 7 L trach collar. Unable to wean her oxygen as she became hypoxemic.  ?Cxr:  Hypoinflation of the lungs with mild bibasilar subsegmental atelectasis. ?  ?Dysphagia secondary to mental status ?PEG tube placed  by general surgery.  Continue tube feeds ?  ?Antiphospholipid antibody with hypercoagulable state ?Anticardiolipin IgM positive, lupus anticoagulant PTT prolonged. ?Resume plavix  and prophylactic lovenox.  ?  ?Essential hypertension ?BP parameters are optimal.  ?Resume amlodipine and propranolol.  ? ? ?Anemia of chronic disease:  ?Hemoglobin stable around 10.7 ? ?In view of poor prognosis udue to multifocal CVA'S and wernicke's encephalopathy,  palliative care consulted ? ? ?Pressure injury of skin ?Labile injury ?RN Pressure Injury Documentation: ?Pressure Injury 01/31/22 [facility] Bilateral Stage 2 -  Partial thickness loss of dermis presenting as a shallow open injury with a red, pink wound bed without slough. purewick device related skin tear, bilat lower labia (Active)  ?01/31/22 2200  ?Location: [facility]  ?Location Orientation: Bilateral  ?Staging: Stage 2 -  Partial thickness loss of dermis presenting as a shallow open injury with a red, pink wound bed without slough.  ?Wound Description (Comments): purewick device related skin tear, bilat lower labia  ?Present on Admission:   ?Dressing Type None 02/11/22 2000  ?Local wound care.  ? ?Malnutrition Type: ? ?Nutrition Problem: Inadequate oral intake ?Etiology: inability to eat ? ? ?Malnutrition Characteristics: ? ?Signs/Symptoms: NPO status ? ? ?Nutrition Interventions: ? ?Interventions: Prostat, MVI, Tube feeding ? ?Estimated body mass index is 31.26 kg/m? as calculated from the following: ?  Height as of this  encounter: 5\' 4"  (1.626 m). ?  Weight as of this encounter: 82.6 kg. ? ?Code Status: full code.  ?DVT Prophylaxis:  SCDs Start: 01/20/22 1012 ? ? ?Level of Care: Level of care: Progressive ?Family Communication: None at bedside. No family at bedside today.  ? ?Disposition Plan:     Remains inpatient appropriate:  SNF/ LTAC. ? ? ?SIGNIFICANT HOSPITAL EVENTS.  ?4/9 Admit  ?4/12 ongoing high dose thiamine for possible Wernike's encephalopathy  ?4/13 IV sedation stopped, phenobarbital wean ?4/17 still not waking up. Trach placed.  ?4/18 PEG placed. Spiking fever. Lactate spiked. Got fluid. Cleared.  ?4/22 Tolerated 6hrs of ATC day prior. No major events overnight, appears that stroke had [place] meeting with family 4/21 with no decision made ?4/23 No issues overnight, on vent this am, remains unresponsive  ?4/27 changed to propranolol from metoprolol for neuro-storming. Started on vanc and cefepime for possible HCAP  ?4/28 trial on open ended trach collar ?5/2 patient transferred to [facility].  ?Procedures:  ?Trach.  ?S/P peg ?Consultants:   ?PCCM.  ? ?Antimicrobials:  ? ?Anti-infectives (From admission, onward)  ? ? Start     Dose/Rate Route Frequency Ordered Stop  ? 02/08/22  1800  vancomycin (VANCOREADY) IVPB 1500 mg/300 mL  Status:  Discontinued       ? 1,500 mg ?150 mL/hr over 120 Minutes Intravenous Every 24 hours 02/07/22 1712 02/09/22 1046  ? 02/07/22 1800  vancomycin (VANCOREADY) IVPB 1750 mg/350 mL       ? 1,750 mg ?175 mL/hr over 120 Minutes Intravenous  Once 02/07/22 1708 02/07/22 2046  ? 02/07/22 1700  ceFEPIme (MAXIPIME) 2 g in sodium chloride 0.9 % 100 mL IVPB       ? 2 g ?200 mL/hr over 30 Minutes Intravenous Every 8 hours 02/07/22 1706 02/14/22 0856  ? 01/20/22 2000  cefTRIAXone (ROCEPHIN) 2 g in sodium chloride 0.9 % 100 mL IVPB       ? 2 g ?200 mL/hr over 30 Minutes Intravenous Every 24 hours 01/20/22 0634 01/24/22 2022  ? 01/20/22 0415  cefTRIAXone (ROCEPHIN) 2 g in sodium chloride 0.9 % 100 mL  IVPB       ? 2 g ?200 mL/hr over 30 Minutes Intravenous  Once 01/20/22 0414 01/20/22 0537  ? ?  ? ? ? ?Medications ? ?Scheduled Meds: ? amantadine  100 mg Per Tube BID  ? amLODipine  10 mg Per Tube Daily

## 2022-02-18 DIAGNOSIS — R40244 Other coma, without documented Glasgow coma scale score, or with partial score reported, unspecified time: Secondary | ICD-10-CM

## 2022-02-18 LAB — CBC
HCT: 32.8 % — ABNORMAL LOW (ref 36.0–46.0)
Hemoglobin: 11.2 g/dL — ABNORMAL LOW (ref 12.0–15.0)
MCH: 27.3 pg (ref 26.0–34.0)
MCHC: 34.1 g/dL (ref 30.0–36.0)
MCV: 80 fL (ref 80.0–100.0)
Platelets: 328 10*3/uL (ref 150–400)
RBC: 4.1 MIL/uL (ref 3.87–5.11)
RDW: 14.9 % (ref 11.5–15.5)
WBC: 11.3 10*3/uL — ABNORMAL HIGH (ref 4.0–10.5)
nRBC: 0 % (ref 0.0–0.2)

## 2022-02-18 LAB — BASIC METABOLIC PANEL
Anion gap: 9 (ref 5–15)
BUN: 17 mg/dL (ref 6–20)
CO2: 29 mmol/L (ref 22–32)
Calcium: 9.5 mg/dL (ref 8.9–10.3)
Chloride: 98 mmol/L (ref 98–111)
Creatinine, Ser: 0.69 mg/dL (ref 0.44–1.00)
GFR, Estimated: >60 mL/min (ref >60)
Glucose, Bld: 128 mg/dL — ABNORMAL HIGH (ref 70–99)
Potassium: 4.6 mmol/L (ref 3.5–5.1)
Sodium: 136 mmol/L (ref 135–145)

## 2022-02-18 LAB — GLUCOSE, CAPILLARY
Glucose-Capillary: 120 mg/dL — ABNORMAL HIGH (ref 70–99)
Glucose-Capillary: 133 mg/dL — ABNORMAL HIGH (ref 70–99)
Glucose-Capillary: 138 mg/dL — ABNORMAL HIGH (ref 70–99)
Glucose-Capillary: 153 mg/dL — ABNORMAL HIGH (ref 70–99)
Glucose-Capillary: 166 mg/dL — ABNORMAL HIGH (ref 70–99)

## 2022-02-18 NOTE — Progress Notes (Signed)
? ? ? [facility] ?                                                                            ? ? ?Cindy Brooks, is a 51 y.o. female, DOB - 1971-03-14, ZOX:[id number] ?Admit date - 01/20/2022    ?Outpatient Primary MD for the patient is Pcp, No ? ?LOS - 29  days ? ? ? ?Brief summary  ? ?Cindy Brooks is a 51 year old female with past medical history significant for uncontrolled hypertension, history of TIA who presented to [facility] ED on 4/9 via EMS after being found unresponsive.  Patient was apparently found unresponsive by family, EMS was called and brought to ED.  ?  ?Upon arrival to the ED, patient was found to be encephalopathic confused with agitation, blood pressure was markedly elevated with SBP 200 with urinalysis concerning for UTI.  Glucose was 57 and patient was given D50 and started on D5 normal saline after thiamine was given by ED physician.   ?CT head without contrast was negative, UDS negative, chest x-ray with cardiomegaly.  Patient was initially admitted to the hospital service for acute encephalopathy and neurology was consulted.  ? Additionally, given that patient was not obtunded with loud respirations and copious secretions with difficulty controlling her airway and clearing out secretions, PCCM was consulted and patient was transferred to the intensive care unit and subsequently intubated on 4/9.  ?EEG with moderate to severe diffuse encephalopathy that is nonspecific, no seizure or epileptiform discharges. ? MR brain 4/11 with patchy restricted diffusion involving both thalami and midbrain likely reflecting an acute ischemic nonhemorrhagic artery of Percheron infarct, no mass effect, multiple additional punctate acute to subacute nonhemorrhagic ischemic infarcts involving both cerebral hemispheres, scattered chronic microhemorrhages deep gray nuclei and cerebrum likely related to chronic poorly controlled hypertension.  ? CT angiogram/venogram head/neck 4/11 with severe bilateral P1  and right P2 PCA stenosis, bilateral ophthalmic infarcts, severe left proximal M2 MCA branch stenosis, severe right A2 ACA stenosis, 40% stenosis left carotid bifurcation in the neck, no evidence of dural venous sinus thrombosis.  ? Carotid ultrasound 4/11 with 1-39% stenosis bilateral ICA with antegrade flow in vertebral arteries.   ?Patient received high-dose IV thiamine for possible Warnicke's encephalopathy.  Was started on phenobarbital which was weaned off.  ? Patient was still obtunded and tracheostomy was placed on 4/17.  PEG tube placed on 4/18.  ? Palliative care was consulted and family requesting continued full support.   ?Patient was weaned off of vent and tolerating trach collar.  Lurline Idol was exchanged on 4/30.  ? Patient is pending LTAC placement.  ? Patient was transferred to the hospitalist service on 5/2. ?Pt seen this am, opening her right eye on calling her name.  ?  ? ?Assessment & Plan  ? ? ?Assessment and Plan: ? ? ?Acute metabolic encephalopathy, POA ?Thalamic CVA ?Wernicke's encephalopathy ?Patient presenting to the ED via EMS after being found unresponsive by family members.  Work-up showing bilateral thalamic, brainstem and diffuse watershed infarcts on MRI.  ?Patient remained obtunded, unable to clear secretions and maintian airway,  required intubation and ventilatory support.  ? Patient was unable to be extubated and underwent tracheostomy on 01/28/2022  and exchanged on 02/10/2022. ? PEG tube placed on 4/18.  PCCM following for trach care.   ?CXR on 5/5 shows Hypoinflation of the lungs with mild bibasilar subsegmental ?atelectasis. ?Continue with Plavix, Crestor. ?Continue with amantadine 100 mg BID . ?She was weaned off keppra.  ?Limited improvement , poor prognosis unfortunately. ?Family requesting full scope of care.  ?She is responding to tactile stimulation and on calling her name, by blinking.  ? ?Acute respiratory failure ?Tracheostomy dependent ?HCAP/ Obesity hypoventilation  syndrome  ?Patient was intubated on admission and was unable to be weaned off the ventilator.  Patient underwent tracheostomy on 01/28/2022. ?PCCM on board for trach care. Completed 7 days of cefepime. On scopolamine patch.  ?She was on 7 lit of oxygen, weaned her down to 5 lit today.  ?Cxr:  Hypoinflation of the lungs with mild bibasilar subsegmental atelectasis. ?  ?Dysphagia secondary to mental status ?PEG tube placed  by general surgery.  Continue tube feeds ?  ?Antiphospholipid antibody with hypercoagulable state ?Anticardiolipin IgM positive, lupus anticoagulant PTT prolonged. ?Resume plavix  and prophylactic lovenox.  ?  ?Essential hypertension ?BP parameters are well controlled.  ?Resume amlodipine and propranolol.  ? ? ?Anemia of chronic disease:  ?Hemoglobin stable around 10.7 ? ?In view of poor prognosis udue to multifocal CVA'S and wernicke's encephalopathy,  palliative care consulted ? ? ?Pressure injury of skin ?Labile injury ?RN Pressure Injury Documentation: ?Pressure Injury 01/31/22 [facility] Bilateral Stage 2 -  Partial thickness loss of dermis presenting as a shallow open injury with a red, pink wound bed without slough. purewick device related skin tear, bilat lower labia (Active)  ?01/31/22 2200  ?Location: [facility]  ?Location Orientation: Bilateral  ?Staging: Stage 2 -  Partial thickness loss of dermis presenting as a shallow open injury with a red, pink wound bed without slough.  ?Wound Description (Comments): purewick device related skin tear, bilat lower labia  ?Present on Admission:   ?Dressing Type None 02/17/22 2000  ?Local wound care.  ? ?Malnutrition Type: ? ?Nutrition Problem: Inadequate oral intake ?Etiology: inability to eat ? ? ?Malnutrition Characteristics: ? ?Signs/Symptoms: NPO status ? ? ?Nutrition Interventions: ? ?Interventions: Prostat, MVI, Tube feeding ? ?Estimated body mass index is 31.26 kg/m? as calculated from the following: ?  Height as of this encounter: _0  (1.626 m). ?   Weight as of this encounter: 82.6 kg. ? ?Code Status: full code.  ?DVT Prophylaxis:  SCDs Start: 01/20/22 1012 ? ? ?Level of Care: Level of care: Progressive ?Family Communication: none at bedside.  ? ?Disposition Plan:     Remains inpatient appropriate:  SNF/ LTAC. ? ? ?[place].  ?4/9 Admit  ?4/12 ongoing high dose thiamine for possible Wernike's encephalopathy  ?4/13 IV sedation stopped, phenobarbital wean ?4/17 still not waking up. Trach placed.  ?4/18 PEG placed. Spiking fever. Lactate spiked. Got fluid. Cleared.  ?4/22 Tolerated 6hrs of ATC day prior. No major events overnight, appears that stroke had [place] meeting with family 4/21 with no decision made ?4/23 No issues overnight, on vent this am, remains unresponsive  ?4/27 changed to propranolol from metoprolol for neuro-storming. Started on vanc and cefepime for possible HCAP  ?4/28 trial on open ended trach collar ?5/2 patient transferred to [facility].  ?Procedures:  ?Trach.  ?S/P peg ?Consultants:   ?PCCM.  ? ?Antimicrobials:  ? ?Anti-infectives (From admission, onward)  ? ? Start     Dose/Rate Route Frequency Ordered Stop  ? 02/08/22 1800  vancomycin (VANCOREADY) IVPB 1500 mg/300  mL  Status:  Discontinued       ? 1,500 mg ?150 mL/hr over 120 Minutes Intravenous Every 24 hours 02/07/22 1712 02/09/22 1046  ? 02/07/22 1800  vancomycin (VANCOREADY) IVPB 1750 mg/350 mL       ? 1,750 mg ?175 mL/hr over 120 Minutes Intravenous  Once 02/07/22 1708 02/07/22 2046  ? 02/07/22 1700  ceFEPIme (MAXIPIME) 2 g in sodium chloride 0.9 % 100 mL IVPB       ? 2 g ?200 mL/hr over 30 Minutes Intravenous Every 8 hours 02/07/22 1706 02/14/22 0856  ? 01/20/22 2000  cefTRIAXone (ROCEPHIN) 2 g in sodium chloride 0.9 % 100 mL IVPB       ? 2 g ?200 mL/hr over 30 Minutes Intravenous Every 24 hours 01/20/22 0634 01/24/22 2022  ? 01/20/22 0415  cefTRIAXone (ROCEPHIN) 2 g in sodium chloride 0.9 % 100 mL IVPB       ? 2 g ?200 mL/hr over 30 Minutes Intravenous   Once 01/20/22 0414 01/20/22 0537  ? ?  ? ? ? ?Medications ? ?Scheduled Meds: ? amantadine  100 mg Per Tube BID  ? amLODipine  10 mg Per Tube Daily  ? chlorhexidine gluconate (MEDLINE KIT)  15 mL Mouth Rinse BID

## 2022-02-18 NOTE — Progress Notes (Signed)
Patient ID: Cindy Brooks, female   DOB: [birth year], 51 y.o.   MRN: [id number] ? ? ? ?Progress Note from the Palliative Medicine Team at [facility] ? ? ?Patient Name: Cindy Brooks        ?Date: 02/18/2022 ?DOB: [birth year]  Age: 51 y.o. MRN#: [id number] ?Attending Physician: Hosie Poisson, MD ?Primary Care Physician: Pcp, No ?Admit Date: 01/20/2022 ? ? ?Medical records reviewed  ? ?51 y.o. female  admitted on  01/20/2022 having been   found outside in her car by family on 4/8 and was moved to the house and left for 5 hours at which time she did not move, transported by EMS to [facility] emergency room, patient was obtunded with loud respirations, copious secretions, difficulty protecting her airway, was intubated ?   ?  ?Significant Hospital events ?  ?4/9 Admit  ?     -CT head No acute abnormality.  ?      -CTA head & neck severe bilateral P1 and right P2 stenoses as well as proximal left  M2 stenosis and severe right ACA stenosis, 40% stenosis of left ICA ?     -MRI  Patchy restricted diffusion in bilateral thalami and midbrain with multiple     punctate acute ischemic infarcts in bilateral cerebral hemispheres and multiple chronic microhemorrhages ?      -Repeat MRI 4/19 multiple infarcts in bilateral frontal and parietal lobes, possibly in watershed distribution ?  ?  ?4/12 ongoing high dose thiamine for possible Wernike's encephalopathy  ?4/13 IV sedation stopped, phenobarbital wean ?4/17 still not waking up. Trach placed.  ?4/18 PEG placed. Spiking fever. Lactate spiked. Got fluid. Cleared.  ?4/22 Tolerated 6hrs of ATC day prior. No major events overnight, appears that stroke had [place] meeting with family 4/21 with no decision made ?4/27 remains on the , unsuccessful wean, remains unresponsive  ?4/28 trial on trach collar ?02/12/22 remains on trach collar, unresponsive to tactile, copious secretions  ?02-18-22 I was unable to get any response today with commands or gentle touch  ? ?  ?Family face ongoing treatment option  decisions, advanced directive decisions and anticipatory care needs. ?  ?I spoke to [employer] by telephone today.  Continued education regarding current medical situation, and concern for long-term meaningful recovery secondary to thalamic stroke ? ?Daughter tells me that she and her siblings are seeing signs of improvement; ' she moved her right foot when we asked her", "she squished her forehead when a pillow was placed underneath it". ? ?Education offered on the importance of seeing consistent, meaningful responses.  Education offered on reflex versus following meaningful commands.  As documented previously in medical record unfortunately patient has a poor prognosis for meaningful neurologic recovery. ? ?Emotional support offered ? ? ?Plan of Care: ?-Full code ?-Family is open to all offered and available medical interventions to prolong life. ?-Family verbalized understanding that next steps in transition of care will be a skilled nursing facility for long-term care. ? ?This family will needs clear, transparent and consistent education  regarding patient's long-term prognosis. ? ? ?I encouraged her children to continue conversations among themselves and with the medical team as they make decisions into the future and always keeping the patient at the center of those decisions. ? ?Questions and concerns addressed    ? ?PMT will continue to support holistically ? ?Wadie Lessen NP  ?Palliative Medicine Team Team Phone # [phone] ?Pager [phone] ?  ?

## 2022-02-18 NOTE — Evaluation (Signed)
Physical Therapy Re-Evaluation ?Patient Details ?Name: Cindy Brooks ?MRN: [id number] ?DOB: [birth year] ?Today's Date: 02/18/2022 ? ?History of Present Illness ? Pt is a 51 y.o. female with history of HTN, TIA, ETOH abuse and medication noncompliance presenting to the ED after she was found by her family to be unresponsive in her car on 4/8. 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 Intubated 4/9. Tracheostormy 4/16.  ?Clinical Impression ? Patient moving toes and a little on L side LE to command, but intermittently.  Not moving UE's today.  Stiffness noted in feet and hands and pt grimaces with R head rotation and with shoulder flexion.  She was moved up into chair position today, but will benefit from skilled PT in the acute setting to mobilize as tolerated and see what she will initiate.  Still recommending SNF level rehab at d/c. ?   ? ?Recommendations for follow up therapy are one component of a multi-disciplinary discharge planning process, led by the attending physician.  Recommendations may be updated based on patient status, additional functional criteria and insurance authorization. ? ?Follow Up Recommendations Skilled nursing-short term rehab (<3 hours/day) ? ?  ?Assistance Recommended at Discharge Frequent or constant Supervision/Assistance  ?Patient can return home with the following ? Two people to help with walking and/or transfers;Two people to help with bathing/dressing/bathroom ? ?  ?Equipment Recommendations Other (comment) (TBA)  ?Recommendations for Other Services ?    ?  ?Functional Status Assessment Patient has had a recent decline in their functional status and/or demonstrates limited ability to make significant improvements in function in a reasonable and predictable amount of time  ? ?  ?Precautions / Restrictions  Precautions ?Precautions: Fall ?Precaution Comments: trach/PEG  ? ?  ? ?Mobility ? Bed Mobility ?Overal bed mobility: Needs Assistance ?Bed Mobility: Supine to Sit ?  ?  ?Supine to sit: Total assist ?  ?  ?General bed mobility comments: assisted with bed into chair position with total A ?  ? ?Transfers ?  ?  ?  ?  ?  ?  ?  ?  ?  ?  ?  ? ?Ambulation/Gait ?  ?  ?  ?  ?  ?  ?  ?  ? ?Stairs ?  ?  ?  ?  ?  ? ?Wheelchair Mobility ?  ? ?Modified Rankin (Stroke Patients Only) ?Modified Rankin (Stroke Patients Only) ?Pre-Morbid Rankin Score: No symptoms ?Modified Rankin: Severe disability ? ?  ? ?Balance   ?  ?  ?  ?  ?  ?  ?  ?  ?  ?  ?  ?  ?  ?  ?  ?  ?  ?  ?   ? ? ? ?Pertinent Vitals/Pain Pain Assessment ?Pain Assessment: Faces ?Faces Pain Scale: Hurts little more ?Pain Location: grimacing with head turn to R and with pushing to limits of shoulder flexion bilaterally ?Pain Descriptors / Indicators: Grimacing ?Pain Intervention(s): Monitored during session, Limited activity within patient's tolerance  ? ? ?Home Living Family/patient expects to be discharged to:: Private residence ?  ?  ?  ?  ?  ?  ?  ?  ?  ?Additional Comments: Pt unable to provide history. No family present.  ?  ?Prior Function   ?  ?  ?  ?  ?  ?  ?  ?  ?  ? ? ?Hand  Dominance  ?   ? ?  ?Extremity/Trunk Assessment  ? Upper Extremity Assessment ?Upper Extremity Assessment: RUE deficits/detail;LUE deficits/detail ?RUE Deficits / Details: PROM grimacing with shoulder flexion/abduction past 100 with stiffness noted, swollen hands and stiff fingers in extension; applied lotion and worked fingers; no active movement noted ?LUE Deficits / Details: PROM grimacing with shoulder flexion/abduction past 100 with stiffness noted, swollen hands and stiff fingers in extension; applied lotion and worked fingers; no active movement noted ?  ? ?Lower Extremity Assessment ?Lower Extremity Assessment: RLE deficits/detail;LLE deficits/detail ?RLE Deficits / Details: some  voluntary  movement seen, but not consistently to command; stiffness in feet with dry skin so lotion applied ?LLE Deficits / Details: some voluntary  movement seen, but not consistently to command;stiffness in feet with dry skin so lotion applied ?  ? ?   ?Communication  ? Communication: Tracheostomy  ?Cognition Arousal/Alertness: Awake/alert ?Behavior During Therapy: Flat affect ?Overall Cognitive Status: Difficult to assess ?  ?  ?  ?  ?  ?  ?  ?  ?  ?  ?  ?  ?  ?  ?  ?  ?General Comments: intermittent following commands; would not squeeze hands, but did wiggle toes ?  ?  ? ?  ?General Comments General comments (skin integrity, edema, etc.): on trach collar 28% VSS throughout ? ?  ?Exercises Other Exercises ?Other Exercises: Cervical PROM performed with stretch as tolerated toward R rotation ?Other Exercises: PROM UE/LE's in supine  ? ?Assessment/Plan  ?  ?PT Assessment Patient needs continued PT services  ?PT Problem List Decreased strength;Decreased mobility;Decreased activity tolerance;Decreased cognition;Cardiopulmonary status limiting activity;Decreased balance;Decreased knowledge of use of DME;Decreased range of motion ? ?   ?  ?PT Treatment Interventions Therapeutic activities;Cognitive remediation;Therapeutic exercise;Patient/family education;Balance training;Functional mobility training;Neuromuscular re-education;Manual techniques   ? ?PT Goals (Current goals can be found in the Care Plan section)  ?Acute Rehab PT Goals ?Patient Stated Goal: unable to state ?PT Goal Formulation: Patient unable to participate in goal setting ?Time For Goal Achievement: 03/04/22 ?Potential to Achieve Goals: Fair ? ?  ?Frequency Min 2X/week ?  ? ? ?Co-evaluation   ?  ?  ?  ?  ? ? ?  ?AM-PAC PT "6 Clicks" Mobility  ?Outcome Measure Help needed turning from your back to your side while in a flat bed without using bedrails?: Total ?Help needed moving from lying on your back to sitting on the side of a flat bed without using  bedrails?: Total ?Help needed moving to and from a bed to a chair (including a wheelchair)?: Total ?Help needed standing up from a chair using your arms (e.g., wheelchair or bedside chair)?: Total ?Help needed to walk in hospital room?: Total ?Help needed climbing 3-5 steps with a railing? : Total ?6 Click Score: 6 ? ?  ?End of Session Equipment Utilized During Treatment: Oxygen ?Activity Tolerance: Patient tolerated treatment well ?Patient left: in bed;with bed alarm set ?Nurse Communication: Mobility status ?PT Visit Diagnosis: Other abnormalities of gait and mobility (R26.89);Other symptoms and signs involving the nervous system (R29.898) ?  ? ?Time: [id number] ?PT Time Calculation (min) (ACUTE ONLY): 34 min ? ? ?Charges:   PT Evaluation ?$PT Re-evaluation: 1 Re-eval ?PT Treatments ?$Therapeutic Activity: 8-22 mins ?  ?   ? ? ?Magda Kiel, PT ?Acute Rehabilitation Services ?[id number] ?Office:[phone] ?02/18/2022 ? ? ?Reginia Naas ?02/18/2022, 5:25 PM ? ?

## 2022-02-18 NOTE — Progress Notes (Signed)
? ?  NAME:  Cindy Brooks, MRN:  [id number], DOB:  [birth year], LOS: 1 ?ADMISSION DATE:  01/20/2022, CONSULTATION DATE:  4/9 ?REFERRING MD:  Hal Hope, CHIEF COMPLAINT:  Confusion  ? ?History of Present Illness:  ?51 y/o female admitted on 4/9 in the setting of a thalamic stroke, treated for possible Wernicke's encphalopathy as well.  She required intubation for airway protection on admission and had a tracheostomy placed on 4/16.  PCCM is following for tracheostomy management. ? ?Pertinent  Medical History  ?Hypertension ?TIA ? 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 Trach placed.  ?4/18 PEG placed. Spiking fever. Lactate spiked. Got fluid. Cleared.  ?4/22 Tolerated 6hrs of ATC day prior. No major events overnight, appears that stroke had [place] meeting with family 4/21 with no decision made ?4/23 No issues overnight, on vent this am, remains unresponsive  ?4/27 changed to propranolol from metoprolol for neuro-storming. Started on vanc and cefepime for possible HCAP  ?4/28 trial on open ended trach collar ?4/30  trach exchange 6 cuffless ? ?Interim History / Subjective:  ? ?No acute events in last 24 hours noted ? ?Objective   ?Blood pressure 125/88, pulse 95, temperature 98 ?F (36.7 ?C), temperature source Axillary, resp. rate (!) 22, height 5\' 4"  (1.626 m), weight 82.6 kg, SpO2 97 %. ?   ?FiO2 (%):  [28 %] 28 %  ? ?Intake/Output Summary (Last 24 hours) at 02/18/2022 0904 ?Last data filed at 02/17/2022 1941 ?Gross per 24 hour  ?Intake --  ?Output 1150 ml  ?Net -1150 ml  ? ?Filed Weights  ? 02/16/22 0500 02/17/22 0403 02/18/22 0432  ?Weight: 78.8 kg 82.6 kg 82.6 kg  ? ? ?Examination: ? ?General:  Resting comfortably in bed ?HENT: NCAT OP clear tracheostomy in place ?PULM: CTA B, normal  effort ?CV: RRR, no mgr ?GI: BS+, soft, nontender ?MSK: normal bulk and tone ?Neuro: leftward gaze, no response to verbal stimuli, no motor movement ? ?Resolved Hospital Problem list   ? ? 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 ?Discussion ?No appreciable change in exam based on my review of records.  Prognosis is poor. ? ?Plan ?Trach care per routine ?No plan for decannulation ?Glycopyrrolate for secretions ?Continue current management ?Will see weekly, sooner if needed ? ?Best Practice (right click and "Reselect all SmartList Selections" daily)  ? ?Per primary ?Roselie Awkward, MD ?[place] [facility] ?Pager: [phone] ?Cell: [phone] ?After 7:00 pm call Elink  [phone] ? ?

## 2022-02-19 DIAGNOSIS — I6381 Other cerebral infarction due to occlusion or stenosis of small artery: Secondary | ICD-10-CM

## 2022-02-19 DIAGNOSIS — G9340 Encephalopathy, unspecified: Secondary | ICD-10-CM | POA: Diagnosis not present

## 2022-02-19 LAB — GLUCOSE, CAPILLARY
Glucose-Capillary: 145 mg/dL — ABNORMAL HIGH (ref 70–99)
Glucose-Capillary: 148 mg/dL — ABNORMAL HIGH (ref 70–99)
Glucose-Capillary: 118 mg/dL — ABNORMAL HIGH (ref 70–99)
Glucose-Capillary: 167 mg/dL — ABNORMAL HIGH (ref 70–99)
Glucose-Capillary: 160 mg/dL — ABNORMAL HIGH (ref 70–99)
Glucose-Capillary: 157 mg/dL — ABNORMAL HIGH (ref 70–99)
Glucose-Capillary: 185 mg/dL — ABNORMAL HIGH (ref 70–99)

## 2022-02-19 LAB — URINE CULTURE: Culture: NO GROWTH

## 2022-02-19 MED ORDER — FREE WATER
250.0000 mL | Freq: Four times a day (QID) | Status: DC
  Administered 2022-02-20 – 2022-03-12 (×82): 250 mL

## 2022-02-19 NOTE — Progress Notes (Signed)
? ? ? [facility] ?                                                                            ? ? ?Cindy Brooks, is a 51 y.o. female, DOB - 04-14-1971, NVB:[id number] ?Admit date - 01/20/2022    ?Outpatient Primary MD for the patient is Pcp, No ? ?LOS - 30  days ? ? ? ?Brief summary  ? ?Cindy Brooks is a 51 year old female with past medical history significant for uncontrolled hypertension, history of TIA who presented to [facility] ED on 4/9 via EMS after being found unresponsive.  Patient was apparently found unresponsive by family, EMS was called and brought to ED.  ?  ?Upon arrival to the ED, patient was found to be encephalopathic confused with agitation, blood pressure was markedly elevated with SBP 200 with urinalysis concerning for UTI.  Glucose was 57 and patient was given D50 and started on D5 normal saline after thiamine was given by ED physician.   ?CT head without contrast was negative, UDS negative, chest x-ray with cardiomegaly.  Patient was initially admitted to the hospital service for acute encephalopathy and neurology was consulted.  ? Additionally, given that patient was not obtunded with loud respirations and copious secretions with difficulty controlling her airway and clearing out secretions, PCCM was consulted and patient was transferred to the intensive care unit and subsequently intubated on 4/9.  ?EEG with moderate to severe diffuse encephalopathy that is nonspecific, no seizure or epileptiform discharges. ? MR brain 4/11 with patchy restricted diffusion involving both thalami and midbrain likely reflecting an acute ischemic nonhemorrhagic artery of Percheron infarct, no mass effect, multiple additional punctate acute to subacute nonhemorrhagic ischemic infarcts involving both cerebral hemispheres, scattered chronic microhemorrhages deep gray nuclei and cerebrum likely related to chronic poorly controlled hypertension.  ? CT angiogram/venogram head/neck 4/11 with severe bilateral P1  and right P2 PCA stenosis, bilateral ophthalmic infarcts, severe left proximal M2 MCA branch stenosis, severe right A2 ACA stenosis, 40% stenosis left carotid bifurcation in the neck, no evidence of dural venous sinus thrombosis.  ? Carotid ultrasound 4/11 with 1-39% stenosis bilateral ICA with antegrade flow in vertebral arteries.   ?Patient received high-dose IV thiamine for possible Warnicke's encephalopathy.  Was started on phenobarbital which was weaned off.  ? Patient was still obtunded and tracheostomy was placed on 4/17.  PEG tube placed on 4/18.  ? Palliative care was consulted and family requesting continued full support.   ?Patient was weaned off of vent and tolerating trach collar.  Lurline Idol was exchanged on 4/30.  ? Patient is pending LTAC placement.  ? Patient was transferred to the hospitalist service on 5/2. ?Discussed the prognosis with the daughters on three different occasions .  ? ?  ? ?Assessment & Plan  ? ? ?Assessment and Plan: ? ? ?Acute metabolic encephalopathy, POA ?Thalamic CVA ?Wernicke's encephalopathy ?Patient presenting to the ED via EMS after being found unresponsive by family members.  Work-up showing bilateral thalamic, brainstem and diffuse watershed infarcts on MRI.  ?Patient remained obtunded, unable to clear secretions and maintian airway,  required intubation and ventilatory support.  ? Patient was unable to be extubated and underwent tracheostomy on 01/28/2022  and exchanged on 02/10/2022. ? PEG tube placed on 4/18.  PCCM following for trach care.   ?CXR on 5/5 shows Hypoinflation of the lungs with mild bibasilar subsegmental ?atelectasis. ?Continue with Plavix, Crestor. ?Continue with amantadine 100 mg BID . ?She was weaned off keppra.  ?Limited improvement , poor prognosis unfortunately. ?Family requesting full scope of care.  ?No change in mental status in the last 2 days.  ? ?Acute respiratory failure ?Tracheostomy dependent ?HCAP/ Obesity hypoventilation syndrome  ?Patient was  intubated on admission and was unable to be weaned off the ventilator.  Patient underwent tracheostomy on 01/28/2022. ?PCCM on board for trach care. Completed 7 days of cefepime. On scopolamine patch.  ?She was on 7 lit of oxygen, weaned her down to 5 lit today.  ?Cxr:  Hypoinflation of the lungs with mild bibasilar subsegmental atelectasis. ?  ?Dysphagia secondary to mental status ?PEG tube placed  by general surgery.  Continue tube feeds ?  ?Antiphospholipid antibody with hypercoagulable state ?Anticardiolipin IgM positive, lupus anticoagulant PTT prolonged. ?Resume plavix  and prophylactic lovenox.  ?  ?Essential hypertension ?BP parameters are optimal.  ?Resume amlodipine and propranolol.  ? ? ?Anemia of chronic disease:  ?Hemoglobin stable around 10.7 ? ?In view of poor prognosis udue to multifocal CVA'S and wernicke's encephalopathy,  palliative care consulted ? ? ?Pressure injury of skin ?Labile injury ?RN Pressure Injury Documentation: ?Pressure Injury 01/31/22 [facility] Bilateral Stage 2 -  Partial thickness loss of dermis presenting as a shallow open injury with a red, pink wound bed without slough. purewick device related skin tear, bilat lower labia (Active)  ?01/31/22 2200  ?Location: [facility]  ?Location Orientation: Bilateral  ?Staging: Stage 2 -  Partial thickness loss of dermis presenting as a shallow open injury with a red, pink wound bed without slough.  ?Wound Description (Comments): purewick device related skin tear, bilat lower labia  ?Present on Admission:   ?Dressing Type None 02/17/22 2000  ?Local wound care.  ? ?Malnutrition Type: ? ?Nutrition Problem: Inadequate oral intake ?Etiology: inability to eat ? ? ?Malnutrition Characteristics: ? ?Signs/Symptoms: NPO status ? ? ?Nutrition Interventions: ? ?Interventions: Prostat, MVI, Tube feeding ? ?Estimated body mass index is 31.26 kg/m? as calculated from the following: ?  Height as of this encounter: 5' 4"  (1.626 m). ?  Weight as of this encounter:  82.6 kg. ? ?Code Status: full code.  ?DVT Prophylaxis:  SCDs Start: 01/20/22 1012 ? ? ?Level of Care: Level of care: Progressive ?Family Communication: none at bedside.  ? ?Disposition Plan:     Remains inpatient appropriate:  SNF/ LTAC. ? ? ?[place].  ?4/9 Admit  ?4/12 ongoing high dose thiamine for possible Wernike's encephalopathy  ?4/13 IV sedation stopped, phenobarbital wean ?4/17 still not waking up. Trach placed.  ?4/18 PEG placed. Spiking fever. Lactate spiked. Got fluid. Cleared.  ?4/22 Tolerated 6hrs of ATC day prior. No major events overnight, appears that stroke had [place] meeting with family 4/21 with no decision made ?4/23 No issues overnight, on vent this am, remains unresponsive  ?4/27 changed to propranolol from metoprolol for neuro-storming. Started on vanc and cefepime for possible HCAP  ?4/28 trial on open ended trach collar ?5/2 patient transferred to [facility].  ?Procedures:  ?Trach.  ?S/P peg ?Consultants:   ?PCCM.  ? ?Antimicrobials:  ? ?Anti-infectives (From admission, onward)  ? ? Start     Dose/Rate Route Frequency Ordered Stop  ? 02/08/22 1800  vancomycin (VANCOREADY) IVPB 1500 mg/300 mL  Status:  Discontinued       ? 1,500 mg ?150 mL/hr over 120 Minutes Intravenous Every 24 hours 02/07/22 1712 02/09/22 1046  ? 02/07/22 1800  vancomycin (VANCOREADY) IVPB 1750 mg/350 mL       ? 1,750 mg ?175 mL/hr over 120 Minutes Intravenous  Once 02/07/22 1708 02/07/22 2046  ? 02/07/22 1700  ceFEPIme (MAXIPIME) 2 g in sodium chloride 0.9 % 100 mL IVPB       ? 2 g ?200 mL/hr over 30 Minutes Intravenous Every 8 hours 02/07/22 1706 02/14/22 0856  ? 01/20/22 2000  cefTRIAXone (ROCEPHIN) 2 g in sodium chloride 0.9 % 100 mL IVPB       ? 2 g ?200 mL/hr over 30 Minutes Intravenous Every 24 hours 01/20/22 0634 01/24/22 2022  ? 01/20/22 0415  cefTRIAXone (ROCEPHIN) 2 g in sodium chloride 0.9 % 100 mL IVPB       ? 2 g ?200 mL/hr over 30 Minutes Intravenous  Once 01/20/22 0414 01/20/22  0537  ? ?  ? ? ? ?Medications ? ?Scheduled Meds: ? amantadine  100 mg Per Tube BID  ? amLODipine  10 mg Per Tube Daily  ? chlorhexidine gluconate (MEDLINE KIT)  15 mL Mouth Rinse BID  ? Chlorhexidine Gluconat

## 2022-02-19 NOTE — Progress Notes (Signed)
Nutrition Follow-up ? ?DOCUMENTATION CODES:  ? ?Not applicable ? ?INTERVENTION:  ? ?Continue tube feeds via PEG tube: ?- Jevity 1.5 @ 55 ml/hr (1320 ml/day) ?- ProSource TF 45 ml daily ?- Free water flushes of 250 ml q 6 hours ? ?Tube feeding regimen provides 2020 kcal, 95 grams of protein, and 1003 ml of H2O. ? ?Total free water with flushes: 2003 ml ? ?- MVI with minerals daily per tube ? ?NUTRITION DIAGNOSIS:  ? ?Inadequate oral intake related to inability to eat as evidenced by NPO status. ? ?Ongoing ? ?GOAL:  ? ?Patient will meet greater than or equal to 90% of their needs ? ?Met via TF at goal ? ?MONITOR:  ? ?Labs, Weight trends, TF tolerance, Skin ? ?REASON FOR ASSESSMENT:  ? ?Ventilator, Consult ?Enteral/tube feeding initiation and management ? ?ASSESSMENT:  ? ?51 year old female who presented to the ED on 4/09 with AMS. PMH of EtOH abuse, HTN, TIA, medication noncompliance. Pt admitted with acute encephalopathy. ? ?04/09 - pt intubated for airway protection ?04/17 - s/p trach ?04/18 - s/p PEG ?04/19 - pt with new watershed infarcts in B frontal, B parietal, and L temporal lobes ? ?No plan for decannulation per Pulmonary note. Discussed pt with RN who reports that pt is tolerating tube feeds without issue. ? ?Met with pt at bedside. Pt not responsive to RD. Tube feeds infusing as ordered. ? ?Current TF: Jevity 1.5 @ 55 ml/hr, ProSource TF 45 ml daily, free water flushes 250 ml q 6 hours ? ?Admit weight: 78.7 kg ?Current weight: 82.6 kg ? ?Pt with mild pitting generalized edema, mild pitting edema to BUE, and mild pitting edema to BLE. ? ?Medications reviewed and include: colace, folic acid, SSI q 4 hours, novolog 4 units q 4 hours, MVI with minerals daily, scopolamine patch, senna, thiamine ? ?Vitamin/Mineral Profile: ?Vitamin B6: 4.6 (WNL) ?Vitamin B12: 930 (H) ?Vitamin A: 44.9 (WNL) ?Zinc: 69 (WNL)  ?CRP: 7.6 (H) ?Vitamin B1: 365.8 (H) ? ?Labs reviewed. CBG's: 118-185 x 24 hours ? ?UOP: 1350 ml x 24  hours ? ?Diet Order:   ?Diet Order   ? ?       ?  Diet NPO time specified  Diet effective now       ?  ? ?  ?  ? ?  ? ? ?EDUCATION NEEDS:  ? ?No education needs have been identified at this time ? ?Skin:  Skin Assessment: ?Skin Integrity Issues: ?Stage II: labia ? ?Last BM:  02/20/22 small type 6 ? ?Height:  ? ?Ht Readings from Last 1 Encounters:  ?01/20/22 5' 4"  (1.626 m)  ? ? ?Weight:  ? ?Wt Readings from Last 1 Encounters:  ?02/20/22 82.6 kg  ? ? ?BMI:  Body mass index is 31.26 kg/m?. ? ?Estimated Nutritional Needs:  ? ?Kcal:  1800-2000 ? ?Protein:  90-110 grams ? ?Fluid:  1.8-2.0 L ? ? ? ?Gustavus Bryant, MS, RD, LDN ?Inpatient Clinical Dietitian ?Please see AMiON for contact information. ? ?

## 2022-02-20 ENCOUNTER — Inpatient Hospital Stay (HOSPITAL_COMMUNITY): Payer: No Typology Code available for payment source

## 2022-02-20 LAB — CBC WITH DIFFERENTIAL/PLATELET
Abs Immature Granulocytes: 0.04 10*3/uL (ref 0.00–0.07)
Basophils Absolute: 0 10*3/uL (ref 0.0–0.1)
Basophils Relative: 0 %
Eosinophils Absolute: 0.4 10*3/uL (ref 0.0–0.5)
Eosinophils Relative: 4 %
HCT: 32.9 % — ABNORMAL LOW (ref 36.0–46.0)
Hemoglobin: 11.1 g/dL — ABNORMAL LOW (ref 12.0–15.0)
Immature Granulocytes: 0 %
Lymphocytes Relative: 19 %
Lymphs Abs: 1.8 10*3/uL (ref 0.7–4.0)
MCH: 27.1 pg (ref 26.0–34.0)
MCHC: 33.7 g/dL (ref 30.0–36.0)
MCV: 80.4 fL (ref 80.0–100.0)
Monocytes Absolute: 0.5 10*3/uL (ref 0.1–1.0)
Monocytes Relative: 5 %
Neutro Abs: 6.6 10*3/uL (ref 1.7–7.7)
Neutrophils Relative %: 72 %
Platelets: 294 10*3/uL (ref 150–400)
RBC: 4.09 MIL/uL (ref 3.87–5.11)
RDW: 14.8 % (ref 11.5–15.5)
WBC: 9.4 10*3/uL (ref 4.0–10.5)
nRBC: 0 % (ref 0.0–0.2)

## 2022-02-20 LAB — GLUCOSE, CAPILLARY
Glucose-Capillary: 153 mg/dL — ABNORMAL HIGH (ref 70–99)
Glucose-Capillary: 118 mg/dL — ABNORMAL HIGH (ref 70–99)
Glucose-Capillary: 163 mg/dL — ABNORMAL HIGH (ref 70–99)
Glucose-Capillary: 148 mg/dL — ABNORMAL HIGH (ref 70–99)
Glucose-Capillary: 153 mg/dL — ABNORMAL HIGH (ref 70–99)
Glucose-Capillary: 128 mg/dL — ABNORMAL HIGH (ref 70–99)

## 2022-02-20 LAB — PROCALCITONIN: Procalcitonin: <0.1 ng/mL

## 2022-02-20 IMAGING — DX DG CHEST 1V PORT
1 series · 1 of 1 positions shown · non-contrast
Comparison: [DATE]

CLINICAL DATA: Pneumonia

EXAM:
PORTABLE CHEST 1 VIEW

[chest ap]
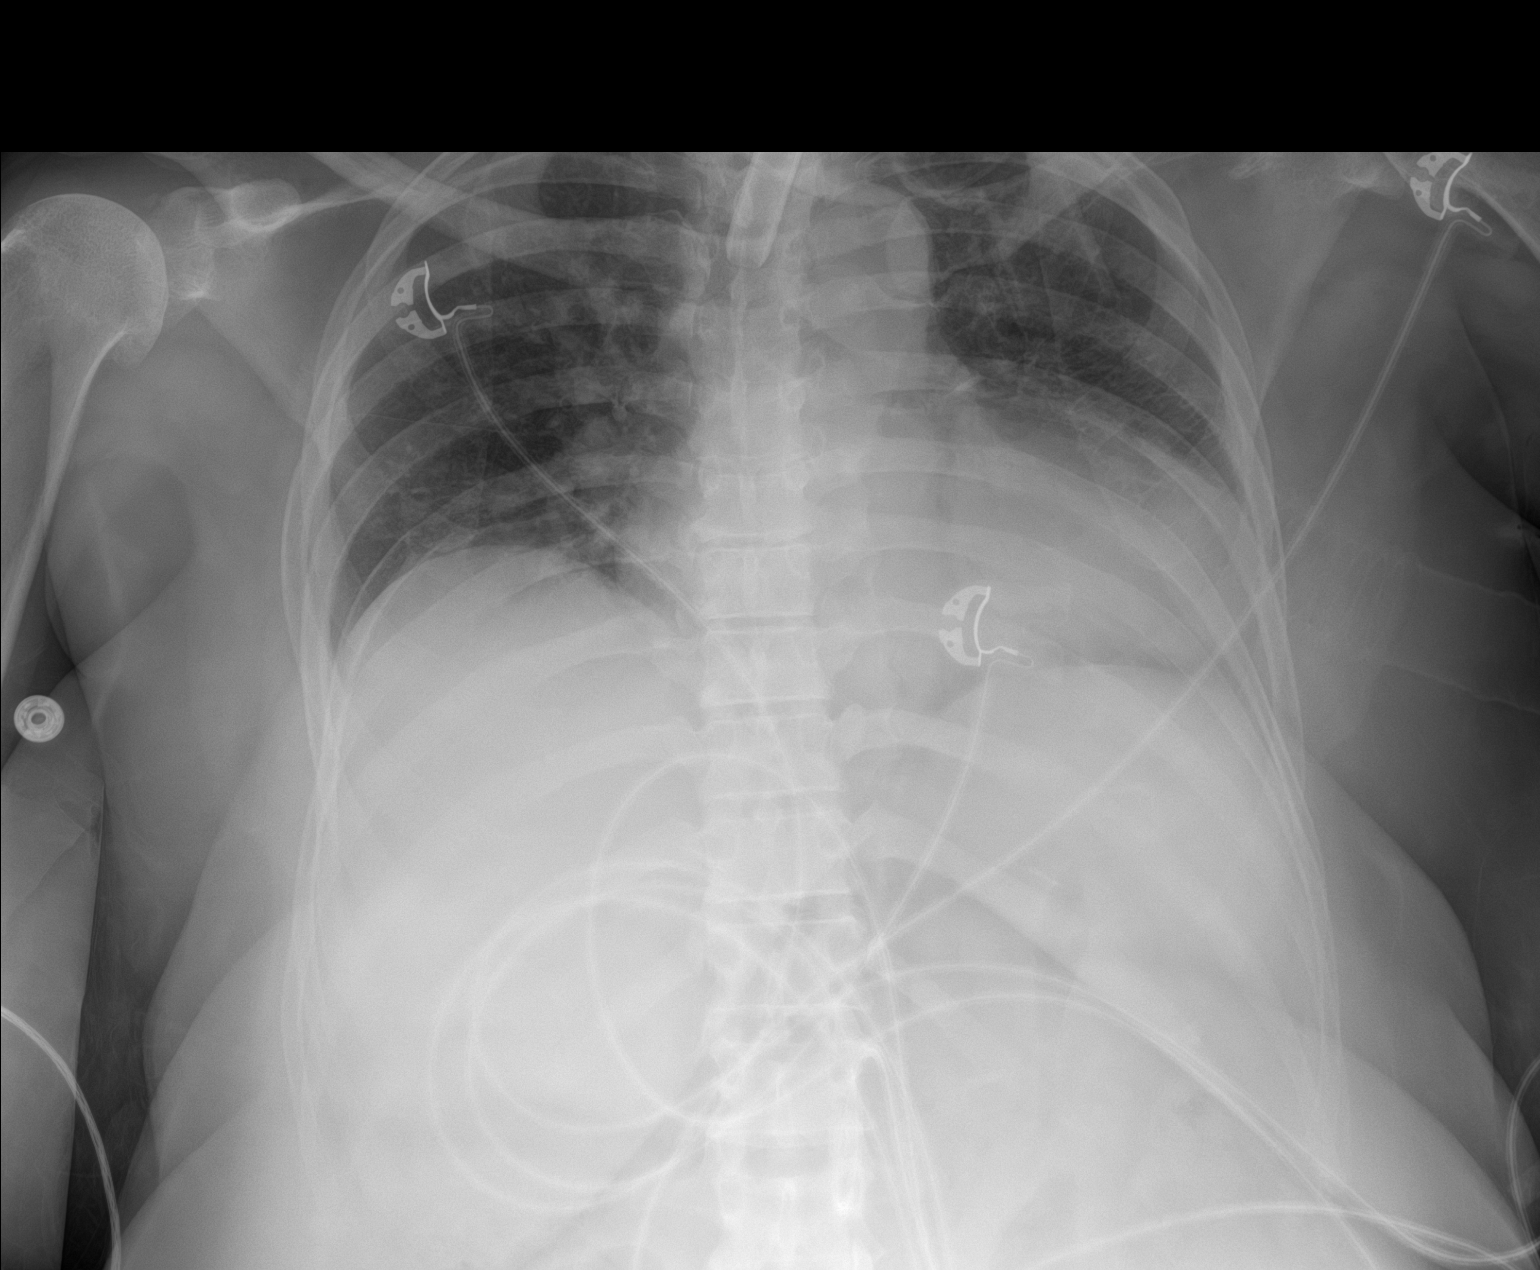

[1 of 1 positions shown; findings below may reference images not displayed]

FINDINGS: Tracheostomy remains in good position. Bibasilar airspace disease
left greater than right unchanged. No significant effusion or edema.
IMPRESSION: Bibasilar airspace disease left greater than right unchanged. No new
findings.

## 2022-02-20 NOTE — Progress Notes (Signed)
Pt temp 100.1 A this morning. MD made aware. See new orders.  ?

## 2022-02-20 NOTE — Progress Notes (Signed)
?PROGRESS NOTE ? ? ? ?Cindy Brooks  NKN:[id number] DOB: [birth year] DOA: 01/20/2022 ?PCP: Pcp, No  ? ? ?Brief Narrative:  ?Cindy Brooks is a 51 year old female with past medical history significant for uncontrolled hypertension, history of TIA who presented to [facility] ED on 4/9 via EMS after being found unresponsive.  Patient was apparently found unresponsive by family, EMS was called and brought to ED. Patient was initially admitted to the hospital service for acute encephalopathy and neurology was consulted.  ?Additionally, given that patient was obtunded with loud respirations and copious secretions with difficulty controlling her airway and clearing out secretions, PCCM was consulted and patient was transferred to the intensive care unit and subsequently intubated on 4/9.  ? ?SIGNIFICANT HOSPITAL EVENTS.  ?4/9 Admit  ?4/12 ongoing high dose thiamine for possible Wernike's encephalopathy  ?4/13 IV sedation stopped, phenobarbital wean ?4/17 still not waking up. Trach placed.  ?4/18 PEG placed. Spiking fever. Lactate spiked. Got fluid. Cleared.  ?4/22 Tolerated 6hrs of ATC day prior. No major events overnight, appears that stroke had [place] meeting with family 4/21 with no decision made ?4/23 No issues overnight, on vent this am, remains unresponsive  ?4/27 changed to propranolol from metoprolol for neuro-storming. Started on vanc and cefepime for possible HCAP  ?4/28 trial on open ended trach collar ?5/2 patient transferred to [facility].  ? ?  ? ?Assessment & Plan: ?  ?Acute metabolic encephalopathy present on admission secondary to multifactorial.  Thalamic CVA.  Also suspected Warnicke's encephalopathy. 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 ?Received high-dose IV thiamine for possible Warnicke's encephalopathy.  Then on phenobarbital which was weaned off. ?Now with trach collar, PEG tube, obtunded and waiting for placement. ?Remains on amantadine, gabapentin.  Mostly obtunded and not needing any medications. ?Anticardiolipin IgM positive, lupus anticoagulant PTT prolonged.  Neurology recommended to treat with Plavix. ? ?Acute respiratory failure secondary to HCAP/obesity hypoventilation syndrome: ?Intubated, unable to come off the ventilator.  Tracheostomy and currently downsized to 6 and remains on trach collar.  Stable.  Completed antibiotics. ? ?Dysphagia: Secondary to above. ? ?Essential hypertension blood pressure stable on amlodipine and propranolol. ? ?Febrile episode: Noted to have intermittent febrile episode. ?UA and urine culture today, CBC with normal WBC count.  Repeat chest x-ray and draw blood cultures.  Probably metabolic fever.  We will hold off on starting antibiotics. ? ? ? ?DVT prophylaxis: SCDs Start: 01/20/22 1012 ? ? ?Code Status: Full code ?Family Communication: None today ?Disposition Plan: Status is: Inpatient ?Remains inpatient appropriate because: Unsafe discharge plan ?  ? ? ?Consultants:  ?Neurology ?Critical care ? ?Procedures:  ?Multiple as above ? ?Antimicrobials:  ?Completed as above ? ? ?Subjective: ?Patient seen and examined.  Overnight had low-grade temperature 100.  Patient is obtunded.  She withdraws on stimulation from the right side. ? ?Objective: ?Vitals:  ? 02/20/22 0315 02/20/22 0500 02/20/22 3790 02/20/22 0742  ?BP:   124/78 119/83  ?Pulse:    95  ?Resp:    (!) 26  ?Temp:    100.1 ?F (37.8 ?C)  ?TempSrc:    Axillary  ?SpO2: 96%   95%  ?Weight:  82.6 kg    ?Height:      ? ? ?Intake/Output Summary (Last 24 hours) at 02/20/2022 1120 ?Last  data filed at 02/20/2022 0905 ?Gross per 24 hour  ?Intake --  ?Output 1450 ml  ?Net -1450 ml  ? ?Filed Weights  ? 02/18/22 0432 02/19/22 0500 02/20/22 0500  ?Weight:  82.6 kg 82.6 kg 82.6 kg  ? ? ?Examination: ? ?General exam: Ill-appearing, not in any distress.  Obtunded. ?Respiratory system: Bilateral poor air entry.  Currently on 5 L oxygen through trach collar. ?Cardiovascular system: S1 & S2 heard, RRR.  ?Gastrointestinal system: Soft.  Nontender.  PEG tube in place.   ?Central nervous system: Obtunded.  Not able to follow any commands. ?Extremities: No spontaneous movements.  Anasarca of the extremities. ? ? ? ?Data Reviewed: I have personally reviewed following labs and imaging studies ? ?CBC: ?Recent Labs  ?Lab 02/14/22 ?0603 02/16/22 ?1355 02/18/22 ?0622 02/20/22 ?0947  ?WBC 11.0* 12.5* 11.3* 9.4  ?NEUTROABS  --   --   --  6.6  ?HGB 10.7* 11.4* 11.2* 11.1*  ?HCT 32.6* 34.0* 32.8* 32.9*  ?MCV 80.5 80.2 80.0 80.4  ?PLT 322 312 328 294  ? ?Basic Metabolic Panel: ?Recent Labs  ?Lab 02/14/22 ?0603 02/16/22 ?1355 02/18/22 ?0622  ?NA 139 135 136  ?K 4.3 4.3 4.6  ?CL 106 99 98  ?CO2 _0 ?GLUCOSE 146* 100* 128*  ?BUN _1 ?CREATININE 0.65 0.56 0.69  ?CALCIUM 9.4 9.4 9.5  ? ?GFR: ?Estimated Creatinine Clearance: 87.5 mL/min (by C-G formula based on SCr of 0.69 mg/dL). ?Liver Function Tests: ?No results for input(s): AST, ALT, ALKPHOS, BILITOT, PROT, ALBUMIN in the last 168 hours. ?No results for input(s): LIPASE, AMYLASE in the last 168 hours. ?No results for input(s): AMMONIA in the last 168 hours. ?Coagulation Profile: ?No results for input(s): INR, PROTIME in the last 168 hours. ?Cardiac Enzymes: ?No results for input(s): CKTOTAL, CKMB, CKMBINDEX, TROPONINI in the last 168 hours. ?BNP (last 3 results) ?No results for input(s): PROBNP in the last 8760 hours. ?HbA1C: ?No results for input(s): HGBA1C in the last 72 hours. ?CBG: ?Recent Labs  ?Lab 02/19/22 ?1612 02/19/22 ?1930 02/19/22 ?2328 02/20/22 ?0309 02/20/22 ?3833  ?GLUCAP 160* 157* 185* 153* 118*  ? ?Lipid Profile: ?No results for input(s): CHOL, HDL, LDLCALC, TRIG, CHOLHDL, LDLDIRECT in the last 72  hours. ?Thyroid Function Tests: ?No results for input(s): TSH, T4TOTAL, FREET4, T3FREE, THYROIDAB in the last 72 hours. ?Anemia Panel: ?No results for input(s): VITAMINB12, FOLATE, FERRITIN, TIBC, IRON, RETICCTPCT in the last 72 hours. ?Sepsis Labs: ?No results for input(s): PROCALCITON, LATICACIDVEN in the last 168 hours. ? ?Recent Results (from the past 240 hour(s))  ?Urine Culture     Status: None  ? Collection Time: 02/18/22  8:35 AM  ? Specimen: Urine, Clean Catch  ?Result Value Ref Range Status  ? Specimen Description URINE, CLEAN CATCH  Final  ? Special Requests NONE  Final  ? Culture   Final  ?  NO GROWTH ?Performed at [facility], [place] [address], [place], [place] [address] ?  ? Report Status 02/19/2022 FINAL  Final  ?  ? ? ? ? ? ?Radiology Studies: ?DG CHEST PORT 1 VIEW ? ?Result Date: 02/20/2022 ?CLINICAL DATA:  Pneumonia EXAM: PORTABLE CHEST 1 VIEW COMPARISON:  02/15/2022 FINDINGS: Tracheostomy remains in good position. Bibasilar airspace disease left greater than right unchanged. No significant effusion or edema. IMPRESSION: Bibasilar airspace disease left greater than right unchanged. No new findings. Electronically Signed   By: Franchot Gallo M.D.   On: 02/20/2022 10:15   ? ? ? ? ? ?  Scheduled Meds: ? amantadine  100 mg Per Tube BID  ? amLODipine  10 mg Per Tube Daily  ? chlorhexidine gluconate (MEDLINE KIT)  15 mL Mouth Rinse BID  ? Chlorhexidine Gluconate Cloth  6 each Topical Daily  ? clopidogrel  75 mg Per Tube Daily  ? docusate  100 mg Per Tube BID  ? feeding supplement (PROSource TF)  45 mL Per Tube Daily  ? folic acid  1 mg Per Tube Daily  ? free water  250 mL Per Tube Q6H  ? gabapentin  300 mg Per Tube BID  ? glycopyrrolate  1 mg Per Tube TID  ? guaiFENesin  10 mL Per Tube Q6H  ? hydrALAZINE  25 mg Per Tube Q8H  ? insulin aspart  0-15 Units Subcutaneous Q4H  ? insulin aspart  4 Units Subcutaneous Q4H  ? mouth rinse  15 mL Mouth Rinse 10 times per day  ? multivitamin with minerals  1 tablet  Per Tube Daily  ? propranolol  40 mg Per Tube TID  ? rosuvastatin  40 mg Per Tube Daily  ? scopolamine  1 patch Transdermal Q72H  ? senna  1 tablet Per Tube Daily  ? thiamine  100 mg Per Tube Daily  ? ?Continuous Infusions: ? sodiu

## 2022-02-21 LAB — GLUCOSE, CAPILLARY
Glucose-Capillary: 120 mg/dL — ABNORMAL HIGH (ref 70–99)
Glucose-Capillary: 129 mg/dL — ABNORMAL HIGH (ref 70–99)
Glucose-Capillary: 135 mg/dL — ABNORMAL HIGH (ref 70–99)
Glucose-Capillary: 145 mg/dL — ABNORMAL HIGH (ref 70–99)
Glucose-Capillary: 156 mg/dL — ABNORMAL HIGH (ref 70–99)
Glucose-Capillary: 157 mg/dL — ABNORMAL HIGH (ref 70–99)

## 2022-02-21 LAB — BLOOD CULTURE ID PANEL (REFLEXED) - BCID2

## 2022-02-21 MED ORDER — GERHARDT'S BUTT CREAM
TOPICAL_CREAM | Freq: Every day | CUTANEOUS | Status: DC | PRN
  Filled 2022-02-21: qty 1

## 2022-02-21 NOTE — TOC Progression Note (Signed)
Transition of Care (TOC) - Progression Note  ? ? ?Patient Details  ?Name: Cindy Brooks ?MRN: [id number] ?Date of Birth: 10-17-1970 ? ?Transition of Care (TOC) CM/SW Contact  ?Vinie Sill, LCSW ?Phone Number: ?02/21/2022, 9:01 AM ? ?Clinical Narrative:    ? ?Patient has no bed offers.  ? ? ?TOC will continue to follow and assist with discharge planning. ? ?Thurmond Butts, MSW, LCSW ?Clinical Social Worker ? ? ? ?Expected Discharge Plan: [place] ?Barriers to Discharge: Inadequate or no insurance, No SNF bed (Trach less than 70 days old) ? ?Expected Discharge Plan and Services ?Expected Discharge Plan: [place] ?In-house Referral: Clinical Social Work ?  ?Post Acute Care Choice: [place] ?Living arrangements for the past 2 months: [place] ?                ?  ?  ?  ?  ?  ?  ?  ?  ?  ?  ? ? ?Social Determinants of Health (SDOH) Interventions ?  ? ?Readmission Risk Interventions ?   ? View : No data to display.  ?  ?  ?  ? ? ?

## 2022-02-21 NOTE — Progress Notes (Signed)
?PROGRESS NOTE ? ? ? ?Cindy Brooks  [id number] DOB: May 11, 1971 DOA: 01/20/2022 ?PCP: Pcp, No  ? ? ?Brief Narrative:  ?Cindy Brooks is a 51 year old female with past medical history significant for uncontrolled hypertension, history of TIA who presented to [facility] ED on 4/9 via EMS after being found unresponsive.  Patient was apparently found unresponsive by family, EMS was called and brought to ED. Patient was initially admitted to the hospital service for acute encephalopathy and neurology was consulted.  ?Additionally, given that patient was obtunded with loud respirations and copious secretions with difficulty controlling her airway and clearing out secretions, PCCM was consulted and patient was transferred to the intensive care unit and subsequently intubated on 4/9.  ? ?SIGNIFICANT HOSPITAL EVENTS.  ?4/9 Admit  ?4/12 ongoing high dose thiamine for possible Wernike's encephalopathy  ?4/13 IV sedation stopped, phenobarbital wean ?4/17 still not waking up. Trach placed.  ?4/18 PEG placed. Spiking fever. Lactate spiked. Got fluid. Cleared.  ?4/22 Tolerated 6hrs of ATC day prior. No major events overnight, appears that stroke had [place] meeting with family 4/21 with no decision made ?4/23 No issues overnight, on vent this am, remains unresponsive  ?4/27 changed to propranolol from metoprolol for neuro-storming. Started on vanc and cefepime for possible HCAP  ?4/28 trial on open ended trach collar ?5/2 patient transferred to [facility].  ? ?  ? ?Assessment & Plan: ?  ?Acute metabolic encephalopathy present on admission secondary to multifactorial causes, Thalamic CVA.  Also suspected Warnicke's encephalopathy. 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 ?Received high-dose IV thiamine for possible Warnicke's encephalopathy.  Then on phenobarbital which was weaned off. ?Now with trach collar, PEG tube, obtunded and waiting for placement. ?Remains on amantadine, gabapentin.   ?Anticardiolipin IgM positive, lupus anticoagulant PTT prolonged.  Neurology recommended to treat with Plavix. ? ?Acute respiratory failure secondary to HCAP/obesity hypoventilation syndrome: ?Intubated, unable to come off the ventilator.  Tracheostomy and currently downsized to 6 and remains on trach collar.  Stable.  Completed antibiotics. ?Repeat chest x-ray with chronic atelectasis.  No evidence of new findings. ? ?Dysphagia: Secondary to above.  On full dose tube feeding. ? ?Essential hypertension blood pressure stable on amlodipine and propranolol. ? ?Febrile episode: Noted to have intermittent febrile episode.  Afebrile last 24 hours. ?UA and urine culture normal.  Procalcitonin is normal.  CBC with normal WBC count.  Repeat chest x-ray was essentially unchanged.   ?Blood cultures with probable contaminant.  Probably metabolic fever.  We will hold off on starting antibiotics. ?Aggressive pulmonary toileting and tracheostomy care. ? ? ? ?DVT prophylaxis: SCDs Start: 01/20/22 1012 ? ? ?Code Status: Full code ?Family Communication: None today ?Disposition Plan: Status is: Inpatient ?Remains inpatient appropriate because: Unsafe discharge plan ?  ? ? ?Consultants:  ?Neurology ?Critical care ? ?Procedures:  ?Multiple as above ? ?Antimicrobials:  ?Completed as above ? ? ?Subjective: ? ?Seen and examined.  Patient was somehow responding in morning on repositioning by the nurses.  Did not follow any commands. ? ?Objective: ?Vitals:  ? 02/21/22 0554 02/21/22 0700 02/21/22 0743 02/21/22 0845  ?BP: 128/88  (!) 150/97   ?Pulse:  (!) 101 98 (!) 102  ?Resp:  (!) 38 (!) 30 (!) 30  ?Temp:   99.3 ?F (37.4 ?C)   ?  TempSrc:   Axillary   ?SpO2:  98% 97% 96%  ?Weight:      ?Height:       ? ? ?Intake/Output Summary (Last 24 hours) at 02/21/2022 1134 ?Last data filed at 02/21/2022 0700 ?Gross per 24 hour  ?Intake --  ?Output 800 ml  ?Net -800 ml  ? ? ?Filed Weights  ? 02/19/22 0500 02/20/22 0500 02/21/22 0441  ?Weight: 82.6 kg 82.6 kg 82.6 kg  ? ? ?Examination: ? ?General exam: Ill-appearing, not in any distress.  Obtunded. ?Feels uncomfortable with some withdrawal on the right side on movement and stimulation. ?Respiratory system: Bilateral poor air entry.  Currently on 5 L oxygen through trach collar. ?Cardiovascular system: S1 & S2 heard, RRR.  ?Gastrointestinal system: Soft.  Nontender.  PEG tube in place.   ?Central nervous system: Obtunded.  Not able to follow any commands. ?Extremities: No spontaneous movements.  Anasarca of the extremities. ? ? ? ?Data Reviewed: I have personally reviewed following labs and imaging studies ? ?CBC: ?Recent Labs  ?Lab 02/16/22 ?1355 02/18/22 ?0622 02/20/22 ?0947  ?WBC 12.5* 11.3* 9.4  ?NEUTROABS  --   --  6.6  ?HGB 11.4* 11.2* 11.1*  ?HCT 34.0* 32.8* 32.9*  ?MCV 80.2 80.0 80.4  ?PLT 312 328 294  ? ? ?Basic Metabolic Panel: ?Recent Labs  ?Lab 02/16/22 ?1355 02/18/22 ?0622  ?NA 135 136  ?K 4.3 4.6  ?CL 99 98  ?CO2 28 29  ?GLUCOSE 100* 128*  ?BUN 15 17  ?CREATININE 0.56 0.69  ?CALCIUM 9.4 9.5  ? ? ?GFR: ?Estimated Creatinine Clearance: 87.5 mL/min (by C-G formula based on SCr of 0.69 mg/dL). ?Liver Function Tests: ?No results for input(s): AST, ALT, ALKPHOS, BILITOT, PROT, ALBUMIN in the last 168 hours. ?No results for input(s): LIPASE, AMYLASE in the last 168 hours. ?No results for input(s): AMMONIA in the last 168 hours. ?Coagulation Profile: ?No results for input(s): INR, PROTIME in the last 168 hours. ?Cardiac Enzymes: ?No results for input(s): CKTOTAL, CKMB, CKMBINDEX, TROPONINI in the last 168 hours. ?BNP (last 3 results) ?No results for input(s): PROBNP in the last 8760 hours. ?HbA1C: ?No results for input(s): HGBA1C in the last 72 hours. ?CBG: ?Recent Labs   ?Lab 02/20/22 ?1600 02/20/22 ?2000 02/20/22 ?2320 02/21/22 ?0408 02/21/22 ?0755  ?GLUCAP 148* 153* 128* 145* 135*  ? ? ?Lipid Profile: ?No results for input(s): CHOL, HDL, LDLCALC, TRIG, CHOLHDL, LDLDIRECT in the last 72 hours. ?Thyroid Function Tests: ?No results for input(s): TSH, T4TOTAL, FREET4, T3FREE, THYROIDAB in the last 72 hours. ?Anemia Panel: ?No results for input(s): VITAMINB12, FOLATE, FERRITIN, TIBC, IRON, RETICCTPCT in the last 72 hours. ?Sepsis Labs: ?Recent Labs  ?Lab 02/20/22 ?0947  ?PROCALCITON <0.10  ? ? ?Recent Results (from the past 240 hour(s))  ?Urine Culture     Status: None  ? Collection Time: 02/18/22  8:35 AM  ? Specimen: Urine, Clean Catch  ?Result Value Ref Range Status  ? Specimen Description URINE, CLEAN CATCH  Final  ? Special Requests NONE  Final  ? Culture   Final  ?  NO GROWTH ?Performed at [facility], [place] [address], [place], [place] [address] ?  ? Report Status 02/19/2022 FINAL  Final  ?Culture, blood (Routine X 2) w Reflex to ID Panel     Status: None (Preliminary result)  ? Collection Time: 02/20/22 11:03 AM  ? Specimen: BLOOD LEFT WRIST  ?Result Value Ref Range Status  ? Specimen Description BLOOD LEFT WRIST  Final  ?  Special Requests   Final  ?  BOTTLES DRAWN AEROBIC AND ANAEROBIC Blood Culture results may not be optimal due to an inadequate volume of blood received in culture bottles  ? Culture   Final  ?  NO GROWTH < 24 HOURS ?Performed at [facility], [place] [address], [place], [place] [address] ?  ? Report Status PENDING  Incomplete  ?Culture, blood (Routine X 2) w Reflex to ID Panel     Status: None (Preliminary result)  ? Collection Time: 02/20/22 11:04 AM  ? Specimen: BLOOD LEFT HAND  ?Result Value Ref Range Status  ? Specimen Description BLOOD LEFT HAND  Final  ? Special Requests   Final  ?  BOTTLES DRAWN AEROBIC AND ANAEROBIC Blood Culture results may not be optimal due to an inadequate volume of blood received in culture bottles  ? Culture  Setup Time    Final  ?  GRAM POSITIVE COCCI ?IN BOTH AEROBIC AND ANAEROBIC BOTTLES ?Organism ID to follow ?CRITICAL RESULT CALLED TO, READ BACK BY AND VERIFIED WITH: J. FRENS PHARMD, AT [id number] 02/21/22 D. VANHOOK ?Performed at [facility]

## 2022-02-21 NOTE — Progress Notes (Signed)
PHARMACY - PHYSICIAN COMMUNICATION ?CRITICAL VALUE ALERT - BLOOD CULTURE IDENTIFICATION (BCID) ? ?Cindy Brooks is an 51 y.o. female who presented to [facility] on 01/20/2022  ? ?Assessment:  Staph epi found in one of four blood culture bottles - suspect contamination ? ?Name of physician (or Provider) Contacted: Dr. Sloan Leiter ? ?Current antibiotics: none ? ?Changes to prescribed antibiotics recommended: recommend to withhold antibiotics for now ?Recommendations accepted by provider ? ?Results for orders placed or performed during the hospital encounter of 01/20/22  ?Blood Culture ID Panel (Reflexed) (Collected: 02/20/2022 11:04 AM)  ?Result Value Ref Range  ? Enterococcus faecalis NOT DETECTED NOT DETECTED  ? Enterococcus Faecium NOT DETECTED NOT DETECTED  ? Listeria monocytogenes NOT DETECTED NOT DETECTED  ? Staphylococcus species DETECTED (A) NOT DETECTED  ? Staphylococcus aureus (BCID) NOT DETECTED NOT DETECTED  ? Staphylococcus epidermidis DETECTED (A) NOT DETECTED  ? Staphylococcus lugdunensis NOT DETECTED NOT DETECTED  ? Streptococcus species NOT DETECTED NOT DETECTED  ? Streptococcus agalactiae NOT DETECTED NOT DETECTED  ? Streptococcus pneumoniae NOT DETECTED NOT DETECTED  ? Streptococcus pyogenes NOT DETECTED NOT DETECTED  ? A.calcoaceticus-baumannii NOT DETECTED NOT DETECTED  ? Bacteroides fragilis NOT DETECTED NOT DETECTED  ? Enterobacterales NOT DETECTED NOT DETECTED  ? Enterobacter cloacae complex NOT DETECTED NOT DETECTED  ? Escherichia coli NOT DETECTED NOT DETECTED  ? Klebsiella aerogenes NOT DETECTED NOT DETECTED  ? Klebsiella oxytoca NOT DETECTED NOT DETECTED  ? Klebsiella pneumoniae NOT DETECTED NOT DETECTED  ? Proteus species NOT DETECTED NOT DETECTED  ? Salmonella species NOT DETECTED NOT DETECTED  ? Serratia marcescens NOT DETECTED NOT DETECTED  ? Haemophilus influenzae NOT DETECTED NOT DETECTED  ? Neisseria meningitidis NOT DETECTED NOT DETECTED  ? Pseudomonas aeruginosa NOT DETECTED NOT DETECTED   ? Stenotrophomonas maltophilia NOT DETECTED NOT DETECTED  ? Candida albicans NOT DETECTED NOT DETECTED  ? Candida auris NOT DETECTED NOT DETECTED  ? Candida glabrata NOT DETECTED NOT DETECTED  ? Candida krusei NOT DETECTED NOT DETECTED  ? Candida parapsilosis NOT DETECTED NOT DETECTED  ? Candida tropicalis NOT DETECTED NOT DETECTED  ? Cryptococcus neoformans/gattii NOT DETECTED NOT DETECTED  ? Methicillin resistance mecA/C DETECTED (A) NOT DETECTED  ? ? ?[name] ?02/21/2022  7:45 AM ? ?

## 2022-02-21 NOTE — Progress Notes (Signed)
Physical Therapy Treatment ?Patient Details ?Name: Cindy Brooks ?MRN: [id number] ?DOB: 11/08/1970 ?Today's Date: 02/21/2022 ? ? ?History of Present Illness Pt is a 51 y.o. female with history of HTN, TIA, ETOH abuse and medication noncompliance presenting to the ED after she was found by her family to be unresponsive in her car on 4/8. 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 Intubated 4/9. Tracheostormy 4/16. ? ?  ?PT Comments  ? ? Patient seated EOB briefly and noted spontaneous movement of R LE and able to improve clearance with coughing while in sitting.  BP reading high, but due to coughing probably not reading correctly.  She will continue to benefit from skilled PT in the acute setting.  Continue to recommend SNF level rehab at d/c.    ?Recommendations for follow up therapy are one component of a multi-disciplinary discharge planning process, led by the attending physician.  Recommendations may be updated based on patient status, additional functional criteria and insurance authorization. ? ?Follow Up Recommendations ? Skilled nursing-short term rehab (<3 hours/day) ?  ?  ?Assistance Recommended at Discharge Frequent or constant Supervision/Assistance  ?Patient can return home with the following Two people to help with walking and/or transfers;Two people to help with bathing/dressing/bathroom ?  ?Equipment Recommendations ? Other (comment) (TBA)  ?  ?Recommendations for Other Services   ? ? ?  ?Precautions / Restrictions Precautions ?Precautions: Fall ?Precaution Comments: trach/PEG  ?  ? ?Mobility ? Bed Mobility ?Overal bed mobility: Needs Assistance ?Bed Mobility: Sit to Supine ?  ?  ?Supine to sit: Total assist ?Sit to supine: Total assist ?  ?General bed mobility comments: up to EOB with +1 total A.  To supine total A for trunk and  legs ?  ? ?Transfers ?  ?  ?  ?  ?  ?  ?  ?  ?  ?  ?  ? ?Ambulation/Gait ?  ?  ?  ?  ?  ?  ?  ?  ? ? ?Stairs ?  ?  ?  ?  ?  ? ? ?Wheelchair Mobility ?  ? ?Modified Rankin (Stroke Patients Only) ?Modified Rankin (Stroke Patients Only) ?Pre-Morbid Rankin Score: No symptoms ?Modified Rankin: Severe disability ? ? ?  ?Balance Overall balance assessment: Needs assistance ?Sitting-balance support: Feet supported ?Sitting balance-Leahy Scale: Zero ?Sitting balance - Comments: max A for balance at EOB approx 6 minutes with pt coughing intermittently, RN in to adjust trach ties; difficulty getting accurate BP with pt coughing,  WNL when back in supine ?  ?  ?  ?  ?  ?  ?  ?  ?  ?  ?  ?  ?  ?  ?  ?  ? ?  ?Cognition Arousal/Alertness: Awake/alert ?Behavior During Therapy: Flat affect ?Overall Cognitive Status: Impaired/Different from baseline ?Area of Impairment: Attention, Following commands, Problem solving ?  ?  ?  ?  ?  ?  ?  ?  ?  ?Current Attention Level: Focused ?  ?Following Commands: Follows one step commands inconsistently ?  ?  ?Problem Solving: Slow processing, Decreased initiation ?  ?  ?  ? ?  ?Exercises Other Exercises ?Other Exercises: PROM LE in supine prior to sitting ? ?  ?General Comments General comments (skin integrity, edema, etc.): on trach collar 28%, intermittently able to expectorate white sputum when sitting upright, but not in  supine ?  ?  ? ?Pertinent Vitals/Pain Pain Assessment ?Faces Pain Scale: Hurts little more ?Pain Location: grimacing with ROM of extremties ?Pain Descriptors / Indicators: Grimacing ?Pain Intervention(s): Monitored during session, Repositioned  ? ? ?Home Living   ?  ?  ?  ?  ?  ?  ?  ?  ?  ?   ?  ?Prior Function    ?  ?  ?   ? ?PT Goals (current goals can now be found in the care plan section) Progress towards PT goals: Progressing toward goals ? ?  ?Frequency ? ? ? Min 2X/week ? ? ? ?  ?PT Plan Current plan remains appropriate  ? ? ?Co-evaluation   ?  ?  ?  ?  ? ?  ?AM-PAC  PT "6 Clicks" Mobility   ?Outcome Measure ? Help needed turning from your back to your side while in a flat bed without using bedrails?: Total ?Help needed moving from lying on your back to sitting on the side of a flat bed without using bedrails?: Total ?Help needed moving to and from a bed to a chair (including a wheelchair)?: Total ?Help needed standing up from a chair using your arms (e.g., wheelchair or bedside chair)?: Total ?Help needed to walk in hospital room?: Total ?Help needed climbing 3-5 steps with a railing? : Total ?6 Click Score: 6 ? ?  ?End of Session Equipment Utilized During Treatment: Oxygen ?Activity Tolerance: Patient tolerated treatment well ?Patient left: in bed ?  ?PT Visit Diagnosis: Other abnormalities of gait and mobility (R26.89);Other symptoms and signs involving the nervous system (R29.898) ?  ? ? ?Time: 1017-1030 ?PT Time Calculation (min) (ACUTE ONLY): 13 min ? ?Charges:  $Therapeutic Activity: 8-22 mins          ?          ? ?Cindy Brooks, PT ?Acute Rehabilitation Services ?[id number] ?Office:[phone] ?02/21/2022 ? ? ? ?Reginia Naas ?02/21/2022, 12:25 PM ? ?

## 2022-02-22 LAB — GLUCOSE, CAPILLARY
Glucose-Capillary: 116 mg/dL — ABNORMAL HIGH (ref 70–99)
Glucose-Capillary: 105 mg/dL — ABNORMAL HIGH (ref 70–99)
Glucose-Capillary: 144 mg/dL — ABNORMAL HIGH (ref 70–99)
Glucose-Capillary: 84 mg/dL (ref 70–99)
Glucose-Capillary: 128 mg/dL — ABNORMAL HIGH (ref 70–99)
Glucose-Capillary: 161 mg/dL — ABNORMAL HIGH (ref 70–99)

## 2022-02-22 MED ORDER — PROCHLORPERAZINE EDISYLATE 10 MG/2ML IJ SOLN: 10.0000 mg | Freq: Three times a day (TID) | INTRAMUSCULAR | Status: DC | PRN

## 2022-02-22 MED ORDER — PROMETHAZINE HCL 6.25 MG/5ML PO SYRP
12.5000 mg | ORAL_SOLUTION | Freq: Four times a day (QID) | ORAL | Status: DC | PRN
  Filled 2022-02-22: qty 10

## 2022-02-22 NOTE — Progress Notes (Signed)
?PROGRESS NOTE ? ? ? ?Cindy Brooks  [id number] DOB: 10/05/1971 DOA: 01/20/2022 ?PCP: Pcp, No  ? ? ?Brief Narrative:  ?Cindy Brooks is a 51 year old female with past medical history significant for uncontrolled hypertension, history of TIA who presented to [facility] ED on 4/9 via EMS after being found unresponsive.  Patient was apparently found unresponsive by family, EMS was called and brought to ED. Patient was initially admitted to the hospital service for acute encephalopathy and neurology was consulted.  ?Additionally, given that patient was obtunded with loud respirations and copious secretions with difficulty controlling her airway and clearing out secretions, PCCM was consulted and patient was transferred to the intensive care unit and subsequently intubated on 4/9.  ? ?SIGNIFICANT HOSPITAL EVENTS.  ?4/9 Admit  ?4/12 ongoing high dose thiamine for possible Wernike's encephalopathy  ?4/13 IV sedation stopped, phenobarbital wean ?4/17 still not waking up. Trach placed.  ?4/18 PEG placed. Spiking fever. Lactate spiked. Got fluid. Cleared.  ?4/22 Tolerated 6hrs of ATC day prior. No major events overnight, appears that stroke had [place] meeting with family 4/21 with no decision made ?4/23 No issues overnight, on vent this am, remains unresponsive  ?4/27 changed to propranolol from metoprolol for neuro-storming. Started on vanc and cefepime for possible HCAP  ?4/28 trial on open ended trach collar ?5/2 patient transferred to [facility].  ? ?  ? ?Assessment & Plan: ?  ?Acute metabolic encephalopathy present on admission secondary to multifactorial causes, Thalamic CVA.  Also suspected Warnicke's encephalopathy. 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 ?Received high-dose IV thiamine for possible Warnicke's encephalopathy.  Then on phenobarbital which was weaned off. ?Now with trach collar, PEG tube, obtunded and waiting for placement. ?Remains on amantadine, gabapentin.   ?Anticardiolipin IgM positive, lupus anticoagulant PTT prolonged.  Neurology recommended to treat with Plavix. ? ?Acute respiratory failure secondary to HCAP/obesity hypoventilation syndrome: ?Intubated, unable to come off the ventilator.  Tracheostomy and currently downsized to 6 and remains on trach collar.  Stable.  Completed antibiotics. ?Repeat chest x-ray with chronic atelectasis.  No evidence of new findings. ? ?Dysphagia: Secondary to above.  On full dose tube feeding. ? ?Essential hypertension blood pressure stable on amlodipine and propranolol. ? ?Febrile episode: Noted to have intermittent febrile episode.  Afebrile last 48 hours. ?UA and urine culture normal.  Procalcitonin is normal.  CBC with normal WBC count.  Repeat chest x-ray was essentially unchanged.   ?Blood cultures with probable contaminant.  Probably metabolic fever.   ?Aggressive pulmonary toileting and tracheostomy care. ? ? ? ?DVT prophylaxis: SCDs Start: 01/20/22 1012 ? ? ?Code Status: Full code ?Family Communication: None today ?Disposition Plan: Status is: Inpatient ?Remains inpatient appropriate because: Unsafe discharge plan, waiting for SNF availability. ?  ? ? ?Consultants:  ?Neurology ?Critical care ? ?Procedures:  ?Multiple as above ? ?Antimicrobials:  ?Completed as above ? ? ?Subjective: ? ?Patient seen and examined.  When I entered, she was hyperventilating but I was able to calm her down.  She winced in pain while moving her right arm.  Looks uncomfortable on examination. ? ?Objective: ?Vitals:  ? 02/22/22 0500 02/22/22 0525 02/22/22 0725 02/22/22 0743  ?BP:  (!) 119/92  136/83  ?Pulse:   84 87  ?Resp:   (!) 24 (!) 22  ?  Temp:    99.5 ?F (37.5 ?C)  ?TempSrc:    Axillary  ?SpO2:    96%  ?Weight:  82.6 kg     ?Height:      ? ? ?Intake/Output Summary (Last 24 hours) at 02/22/2022 1109 ?Last data filed at 02/22/2022 0600 ?Gross per 24 hour  ?Intake --  ?Output 1500 ml  ?Net -1500 ml  ? ?Filed Weights  ? 02/20/22 0500 02/21/22 0441 02/22/22 0500  ?Weight: 82.6 kg 82.6 kg 82.6 kg  ? ? ?Examination: ? ?General exam: Ill-appearing, not in any distress.   ?Feels uncomfortable with some withdrawal on the right side on movement and stimulation. ?Respiratory system: Bilateral poor air entry.  Hyperventilating but mostly equal air entry. ?SpO2: 96 % ?O2 Flow Rate (L/min): 5 L/min ?FiO2 (%): 28 %  ?Cardiovascular system: S1 & S2 heard, RRR.  ?Gastrointestinal system: Soft.  Nontender.  PEG tube in place.   ?Central nervous system: Obtunded.  Not able to follow any commands. ?Extremities: Some spontaneous movements of the right side mainly right upper extremity.  Anasarca of the extremities. ? ? ? ?Data Reviewed: I have personally reviewed following labs and imaging studies ? ?CBC: ?Recent Labs  ?Lab 02/16/22 ?1355 02/18/22 ?0622 02/20/22 ?0947  ?WBC 12.5* 11.3* 9.4  ?NEUTROABS  --   --  6.6  ?HGB 11.4* 11.2* 11.1*  ?HCT 34.0* 32.8* 32.9*  ?MCV 80.2 80.0 80.4  ?PLT 312 328 294  ? ?Basic Metabolic Panel: ?Recent Labs  ?Lab 02/16/22 ?1355 02/18/22 ?0622  ?NA 135 136  ?K 4.3 4.6  ?CL 99 98  ?CO2 28 29  ?GLUCOSE 100* 128*  ?BUN 15 17  ?CREATININE 0.56 0.69  ?CALCIUM 9.4 9.5  ? ?GFR: ?Estimated Creatinine Clearance: 87.5 mL/min (by C-G formula based on SCr of 0.69 mg/dL). ?Liver Function Tests: ?No results for input(s): AST, ALT, ALKPHOS, BILITOT, PROT, ALBUMIN in the last 168 hours. ?No results for input(s): LIPASE, AMYLASE in the last 168 hours. ?No results for input(s): AMMONIA in the last 168 hours. ?Coagulation Profile: ?No results for input(s): INR, PROTIME in the last 168 hours. ?Cardiac Enzymes: ?No results for input(s): CKTOTAL, CKMB, CKMBINDEX, TROPONINI in the last 168 hours. ?BNP (last 3 results) ?No results for  input(s): PROBNP in the last 8760 hours. ?HbA1C: ?No results for input(s): HGBA1C in the last 72 hours. ?CBG: ?Recent Labs  ?Lab 02/21/22 ?2009 02/21/22 ?2335 02/22/22 ?0325 02/22/22 ?[id number] 02/22/22 ?1105  ?GLUCAP 156* 129* 116* 105* 144*  ? ?Lipid Profile: ?No results for input(s): CHOL, HDL, LDLCALC, TRIG, CHOLHDL, LDLDIRECT in the last 72 hours. ?Thyroid Function Tests: ?No results for input(s): TSH, T4TOTAL, FREET4, T3FREE, THYROIDAB in the last 72 hours. ?Anemia Panel: ?No results for input(s): VITAMINB12, FOLATE, FERRITIN, TIBC, IRON, RETICCTPCT in the last 72 hours. ?Sepsis Labs: ?Recent Labs  ?Lab 02/20/22 ?0947  ?PROCALCITON <0.10  ? ? ?Recent Results (from the past 240 hour(s))  ?Urine Culture     Status: None  ? Collection Time: 02/18/22  8:35 AM  ? Specimen: Urine, Clean Catch  ?Result Value Ref Range Status  ? Specimen Description URINE, CLEAN CATCH  Final  ? Special Requests NONE  Final  ? Culture   Final  ?  NO GROWTH ?Performed at [facility], [place] [address] [place], [place] [address] ?  ? Report Status 02/19/2022 FINAL  Final  ?Culture, blood (Routine X 2) w Reflex to ID Panel     Status: None (Preliminary result)  ? Collection Time:  02/20/22 11:03 AM  ? Specimen: BLOOD LEFT WRIST  ?Result Value Ref Range Status  ? Specimen Description BLOOD LEFT WRIST  Final  ? Special Requests   Final  ?  BOTTLES DRAWN AEROBIC AND ANAEROBIC Blood Culture results may not be optimal due to an inadequate volume of blood received in culture bottles  ? Culture   Final  ?  NO GROWTH 2 DAYS ?Performed at [facility], [place] [address], [place], [place] [address] ?  ? Report Status PENDING  Incomplete  ?Culture, blood (Routine X 2) w Reflex to ID Panel     Status: Abnormal (Preliminary result)  ? Collection Time: 02/20/22 11:04 AM  ? Specimen: BLOOD LEFT HAND  ?Result Value Ref Range Status  ? Specimen Description BLOOD LEFT HAND  Final  ? Special Requests   Final  ?  BOTTLES DRAWN AEROBIC AND ANAEROBIC Blood  Culture results may not be optimal due to an inadequate volume of blood received in culture bottles  ? Culture  Setup Time   Final  ?  GRAM POSITIVE COCCI ?IN BOTH AEROBIC AND ANAEROBIC BOTTLES ?Organism ID to follow ?CR

## 2022-02-22 NOTE — TOC Progression Note (Signed)
Transition of Care (TOC) - Progression Note  ? ? ?Patient Details  ?Name: Cindy Brooks ?MRN: [id number] ?Date of Birth: 04/10/1971 ? ?Transition of Care (TOC) CM/SW Contact  ?Eduard Roux, LCSW ?Phone Number: ?02/22/2022, 10:39 AM ? ?Clinical Narrative:    ? ? ?Patient has no bed offers. ? ?[facility] unable to make offer. ? ?Antony Blackbird, MSW, LCSW ?Clinical Social Worker ? ? ? ? ?Expected Discharge Plan: Skilled Nursing Facility ?Barriers to Discharge: Inadequate or no insurance, No SNF bed (Trach less than 9 days old) ? ?Expected Discharge Plan and Services ?Expected Discharge Plan: Skilled Nursing Facility ?In-house Referral: Clinical Social Work ?  ?Post Acute Care Choice: Skilled Nursing Facility ?Living arrangements for the past 2 months: Single Family Home ?                ?  ?  ?  ?  ?  ?  ?  ?  ?  ?  ? ? ?Social Determinants of Health (SDOH) Interventions ?  ? ?Readmission Risk Interventions ?   ? View : No data to display.  ?  ?  ?  ? ? ?

## 2022-02-23 LAB — CULTURE, BLOOD (ROUTINE X 2)

## 2022-02-23 LAB — GLUCOSE, CAPILLARY
Glucose-Capillary: 120 mg/dL — ABNORMAL HIGH (ref 70–99)
Glucose-Capillary: 126 mg/dL — ABNORMAL HIGH (ref 70–99)
Glucose-Capillary: 131 mg/dL — ABNORMAL HIGH (ref 70–99)
Glucose-Capillary: 114 mg/dL — ABNORMAL HIGH (ref 70–99)
Glucose-Capillary: 121 mg/dL — ABNORMAL HIGH (ref 70–99)
Glucose-Capillary: 125 mg/dL — ABNORMAL HIGH (ref 70–99)

## 2022-02-23 NOTE — Progress Notes (Signed)
?PROGRESS NOTE ? ? ? ?Cindy Brooks  [id number] DOB: 10-Nov-1970 DOA: 01/20/2022 ?PCP: Pcp, No  ? ? ?Brief Narrative:  ?Cindy Brooks is a 51 year old female with past medical history significant for uncontrolled hypertension, history of TIA who presented to [facility] ED on 4/9 via EMS after being found unresponsive.  Patient was apparently found unresponsive by family, EMS was called and brought to ED. Patient was initially admitted to the hospital service for acute encephalopathy and neurology was consulted.  ?Additionally, given that patient was obtunded with loud respirations and copious secretions with difficulty controlling her airway and clearing out secretions, PCCM was consulted and patient was transferred to the intensive care unit and subsequently intubated on 4/9.  ? ?SIGNIFICANT HOSPITAL EVENTS.  ?4/9 Admit  ?4/12 ongoing high dose thiamine for possible Wernike's encephalopathy  ?4/13 IV sedation stopped, phenobarbital wean ?4/17 still not waking up. Trach placed.  ?4/18 PEG placed. Spiking fever. Lactate spiked. Got fluid. Cleared.  ?4/22 Tolerated 6hrs of ATC day prior. No major events overnight, appears that stroke had [place] meeting with family 4/21 with no decision made ?4/23 No issues overnight, on vent this am, remains unresponsive  ?4/27 changed to propranolol from metoprolol for neuro-storming. Started on vanc and cefepime for possible HCAP  ?4/28 trial on open ended trach collar ?5/2 patient transferred to [facility].  ? ?  ? ?Assessment & Plan: ?  ?Acute metabolic encephalopathy present on admission secondary to multifactorial causes, Thalamic CVA.  Also suspected Warnicke's encephalopathy. 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 ?Received high-dose IV thiamine for possible Warnicke's encephalopathy.  Then on phenobarbital which was weaned off. ?Now with trach collar, PEG tube, obtunded and waiting for placement. ?Remains on amantadine, gabapentin.   ?Anticardiolipin IgM positive, lupus anticoagulant PTT prolonged.  Neurology recommended to treat with Plavix. ? ?Acute respiratory failure secondary to HCAP/obesity hypoventilation syndrome: ?Intubated, unable to come off the ventilator.  Tracheostomy and currently downsized to 6 and remains on trach collar.  Stable.  Completed antibiotics. ?Repeat chest x-ray with chronic atelectasis.  No evidence of new findings. ? ?Dysphagia: Secondary to above.  On full dose tube feeding. ? ?Essential hypertension blood pressure stable on amlodipine and propranolol. ? ?Febrile episode: Noted to have intermittent febrile episode.  Afebrile last 3 days. ?UA and urine culture normal.  Procalcitonin is normal.  CBC with normal WBC count.  Repeat chest x-ray was essentially unchanged.   ?Blood cultures with probable contaminant.    ?Aggressive pulmonary toileting and tracheostomy care. ? ? ? ?DVT prophylaxis: SCDs Start: 01/20/22 1012 ? ? ?Code Status: Full code ?Family Communication: Daughter on the phone 5/12. ?Disposition Plan: Status is: Inpatient ?Remains inpatient appropriate because: Unsafe discharge plan, waiting for SNF availability.  Medically stable to transfer to SNF. ?  ? ? ?Consultants:  ?Neurology ?Critical care ? ?Procedures:  ?Multiple as above ? ?Antimicrobials:  ?Completed as above ? ? ?Subjective: ? ?Seen and examined.  Did not respond.  No new events.  No more hyperventilating episodes. ? ?Objective: ?Vitals:  ? 02/23/22 0507 02/23/22 0516 02/23/22 0730 02/23/22 0744  ?BP: (!) 138/100 (!) 138/100  126/84  ?Pulse:  88 85 88  ?Resp:  (!) 27 (!) 22 (!) 22  ?Temp:    99.1 ?F (37.3 ?C)  ?  TempSrc:    Axillary  ?SpO2:  97%  95%  ?Weight:      ?Height:      ? ? ?Intake/Output Summary  (Last 24 hours) at 02/23/2022 1044 ?Last data filed at 02/23/2022 3546 ?Gross per 24 hour  ?Intake --  ?Output 1200 ml  ?Net -1200 ml  ? ?Filed Weights  ? 02/21/22 0441 02/22/22 0500 02/23/22 0456  ?Weight: 82.6 kg 82.6 kg 82.7 kg  ? ? ?Examination: ? ?General exam: Ill-appearing, not in any distress.   ?Respiratory system: Bilateral poor air entry.  Upper airway sounds. ?SpO2: 95 % ?O2 Flow Rate (L/min): 5 L/min ?FiO2 (%): 28 %  ?Cardiovascular system: S1 & S2 heard, RRR.  ?Gastrointestinal system: Soft.  Nontender.  PEG tube in place.   ?Central nervous system: Obtunded.  Not able to follow any commands. ?Extremities: Anasarca. ? ? ?Data Reviewed: I have personally reviewed following labs and imaging studies ? ?CBC: ?Recent Labs  ?Lab 02/16/22 ?1355 02/18/22 ?0622 02/20/22 ?5681  ?WBC 12.5* 11.3* 9.4  ?NEUTROABS  --   --  6.6  ?HGB 11.4* 11.2* 11.1*  ?HCT 34.0* 32.8* 32.9*  ?MCV 80.2 80.0 80.4  ?PLT 312 328 294  ? ?Basic Metabolic Panel: ?Recent Labs  ?Lab 02/16/22 ?1355 02/18/22 ?0622  ?NA 135 136  ?K 4.3 4.6  ?CL 99 98  ?CO2 28 29  ?GLUCOSE 100* 128*  ?BUN 15 17  ?CREATININE 0.56 0.69  ?CALCIUM 9.4 9.5  ? ?GFR: ?Estimated Creatinine Clearance: 87.5 mL/min (by C-G formula based on SCr of 0.69 mg/dL). ?Liver Function Tests: ?No results for input(s): AST, ALT, ALKPHOS, BILITOT, PROT, ALBUMIN in the last 168 hours. ?No results for input(s): LIPASE, AMYLASE in the last 168 hours. ?No results for input(s): AMMONIA in the last 168 hours. ?Coagulation Profile: ?No results for input(s): INR, PROTIME in the last 168 hours. ?Cardiac Enzymes: ?No results for input(s): CKTOTAL, CKMB, CKMBINDEX, TROPONINI in the last 168 hours. ?BNP (last 3 results) ?No results for input(s): PROBNP in the last 8760 hours. ?HbA1C: ?No results for input(s): HGBA1C in the last 72 hours. ?CBG: ?Recent Labs  ?Lab 02/22/22 ?1518 02/22/22 ?2000 02/22/22 ?2319 02/23/22 ?0310 02/23/22 ?0750  ?GLUCAP 84 128* 161* 120* 126*  ? ?Lipid Profile: ?No results  for input(s): CHOL, HDL, LDLCALC, TRIG, CHOLHDL, LDLDIRECT in the last 72 hours. ?Thyroid Function Tests: ?No results for input(s): TSH, T4TOTAL, FREET4, T3FREE, THYROIDAB in the last 72 hours. ?Anemia Panel: ?No results for input(s): VITAMINB12, FOLATE, FERRITIN, TIBC, IRON, RETICCTPCT in the last 72 hours. ?Sepsis Labs: ?Recent Labs  ?Lab 02/20/22 ?2751  ?PROCALCITON <0.10  ? ? ?Recent Results (from the past 240 hour(s))  ?Urine Culture     Status: None  ? Collection Time: 02/18/22  8:35 AM  ? Specimen: Urine, Clean Catch  ?Result Value Ref Range Status  ? Specimen Description URINE, CLEAN CATCH  Final  ? Special Requests NONE  Final  ? Culture   Final  ?  NO GROWTH ?Performed at [facility], [address], [place], [place] [address] ?  ? Report Status 02/19/2022 FINAL  Final  ?Culture, blood (Routine X 2) w Reflex to ID Panel     Status: None (Preliminary result)  ? Collection Time: 02/20/22 11:03 AM  ? Specimen: BLOOD LEFT WRIST  ?Result Value Ref Range Status  ? Specimen Description BLOOD LEFT WRIST  Final  ? Special Requests   Final  ?  BOTTLES DRAWN AEROBIC AND ANAEROBIC Blood Culture results  may not be optimal due to an inadequate volume of blood received in culture bottles  ? Culture   Final  ?  NO GROWTH 3 DAYS ?Performed at [facility], [place] [address] [place], [place] [address] ?  ? Report Status PENDING  Incomplete  ?Culture, blood (Routine X 2) w Reflex to ID Panel     Status: Abnormal  ? Collection Time: 02/20/22 11:04 AM  ? Specimen: BLOOD LEFT HAND  ?Result Value Ref Range Status  ? Specimen Description BLOOD LEFT HAND  Final  ? Special Requests   Final  ?  BOTTLES DRAWN AEROBIC AND ANAEROBIC Blood Culture results may not be optimal due to an inadequate volume of blood received in culture bottles  ? Culture  Setup Time   Final  ?  GRAM POSITIVE COCCI ?IN BOTH AEROBIC AND ANAEROBIC BOTTLES ?Organism ID to follow ?CRITICAL RESULT CALLED TO, READ BACK BY AND VERIFIED WITH: J. FRENS  PHARMD, AT [id number] 02/21/22 D. VANHOOK ?  ? Culture (A)  Final  ?  STAPHYLOCOCCUS EPIDERMIDIS ?THE SIGNIFICANCE OF ISOLATING THIS ORGANISM FROM A SINGLE SET OF BLOOD CULTURES WHEN MULTIPLE SETS ARE DRAWN IS UNCERTAIN. PLEASE N

## 2022-02-24 ENCOUNTER — Other Ambulatory Visit: Payer: Self-pay

## 2022-02-24 LAB — GLUCOSE, CAPILLARY
Glucose-Capillary: 109 mg/dL — ABNORMAL HIGH (ref 70–99)
Glucose-Capillary: 123 mg/dL — ABNORMAL HIGH (ref 70–99)
Glucose-Capillary: 125 mg/dL — ABNORMAL HIGH (ref 70–99)
Glucose-Capillary: 127 mg/dL — ABNORMAL HIGH (ref 70–99)
Glucose-Capillary: 138 mg/dL — ABNORMAL HIGH (ref 70–99)
Glucose-Capillary: 141 mg/dL — ABNORMAL HIGH (ref 70–99)
Glucose-Capillary: 151 mg/dL — ABNORMAL HIGH (ref 70–99)

## 2022-02-24 NOTE — Progress Notes (Signed)
?PROGRESS NOTE ? ? ? ?Cindy Brooks  [id number] DOB: May 24, 1971 DOA: 01/20/2022 ?PCP: Pcp, No  ? ? ?Brief Narrative:  ?Ane Method is a 51 year old female with past medical history significant for uncontrolled hypertension, history of TIA who presented to [facility] ED on 4/9 via EMS after being found unresponsive.  Patient was apparently found unresponsive by family, EMS was called and brought to ED. Patient was initially admitted to the hospital service for acute encephalopathy and neurology was consulted.  ?Additionally, given that patient was obtunded with loud respirations and copious secretions with difficulty controlling her airway and clearing out secretions, PCCM was consulted and patient was transferred to the intensive care unit and subsequently intubated on 4/9.  ? ?SIGNIFICANT HOSPITAL EVENTS.  ?4/9 Admit  ?4/12 ongoing high dose thiamine for possible Wernike's encephalopathy  ?4/13 IV sedation stopped, phenobarbital wean ?4/17 still not waking up. Trach placed.  ?4/18 PEG placed. Spiking fever. Lactate spiked. Got fluid. Cleared.  ?4/22 Tolerated 6hrs of ATC day prior. No major events overnight, appears that stroke had [place] meeting with family 4/21 with no decision made ?4/23 No issues overnight, on vent this am, remains unresponsive  ?4/27 changed to propranolol from metoprolol for neuro-storming. Started on vanc and cefepime for possible HCAP  ?4/28 trial on open ended trach collar ?5/2 patient transferred to [facility].  ?5/14 waking up and following simple commands. ? ?  ? ?Assessment & Plan: ?  ?Acute metabolic encephalopathy present on admission secondary to multifactorial causes, Thalamic CVA.  suspected Warnicke's encephalopathy. 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 ?Received high-dose IV thiamine for possible Warnicke's encephalopathy.  Then on phenobarbital which was weaned off. ?Now with trach collar, PEG tube, and waiting for placement. ?Remains on amantadine, gabapentin.   ?Anticardiolipin IgM positive, lupus anticoagulant PTT prolonged.  Neurology recommended to treat with Plavix. ?If patient continues to remain more alert, will ask a speech therapy to try speaking valve. ? ?Acute respiratory failure secondary to HCAP/obesity hypoventilation syndrome: ?Intubated, unable to come off the ventilator.  Tracheostomy and currently downsized to 6 and remains on trach collar.  Stable.  Completed antibiotics. ?Repeat chest x-ray with chronic atelectasis.  No evidence of new findings. ? ?Dysphagia: Secondary to above.  On full dose tube feeding. ? ?Essential hypertension blood pressure stable on amlodipine and propranolol. ? ?Febrile episode: Noted to have intermittent febrile episode.  Improved now. ?Continue respiratory therapy and tracheostomy care.   ? ? ? ?DVT prophylaxis: SCDs Start: 01/20/22 1012 ? ? ?Code Status: Full code ?Family Communication: None. ?Disposition Plan: Status is: Inpatient ?Remains inpatient appropriate because: Unsafe discharge plan, waiting for SNF availability.  Medically stable to transfer to SNF. ?  ? ? ?Consultants:  ?Neurology ?Critical care ? ?Procedures:  ?Multiple as above ? ?Antimicrobials:  ?Completed as above ? ? ?Subjective: ? ?Patient seen and examined.  Today, she was able to follow simple commands on the her right arm.  Patient was able to verbalize that she is doing well when asked. ?This was first time patient had interacted today. ? ?Objective: ?Vitals:  ? 02/24/22 0510 02/24/22 0704 02/24/22 0723 02/24/22 0757  ?BP: 125/79 131/89 132/90 132/90  ?Pulse:  86 84 85  ?Resp:  14 (!) 28 20  ?Temp:   98.6 ?  F (37 ?C)   ?TempSrc:   Oral   ?SpO2:  96% 97% 100%  ?Weight:      ?Height:       ? ? ?Intake/Output Summary (Last 24 hours) at 02/24/2022 1108 ?Last data filed at 02/23/2022 2312 ?Gross per 24 hour  ?Intake --  ?Output 800 ml  ?Net -800 ml  ? ?Filed Weights  ? 02/22/22 0500 02/23/22 0456 02/24/22 0500  ?Weight: 82.6 kg 82.7 kg 84.3 kg  ? ? ?Examination: ? ?General exam: Ill-appearing, not in any distress.  Occasionally hyperventilating.  Frail and debilitated. ?Respiratory system: Bilateral poor air entry.  Upper airway sounds. ?SpO2: 100 % ?O2 Flow Rate (L/min): 5 L/min ?FiO2 (%): 28 %  ?Cardiovascular system: S1 & S2 heard, RRR.  ?Gastrointestinal system: Soft.  Nontender.  PEG tube in place.   ?Central nervous system: Follows some commands on the right upper extremity.  She was able to appropriately nod yes and no and try to voice some words. ?Extremities: Anasarca. ? ? ?Data Reviewed: I have personally reviewed following labs and imaging studies ? ?CBC: ?Recent Labs  ?Lab 02/18/22 ?0622 02/20/22 ?0947  ?WBC 11.3* 9.4  ?NEUTROABS  --  6.6  ?HGB 11.2* 11.1*  ?HCT 32.8* 32.9*  ?MCV 80.0 80.4  ?PLT 328 294  ? ?Basic Metabolic Panel: ?Recent Labs  ?Lab 02/18/22 ?0622  ?NA 136  ?K 4.6  ?CL 98  ?CO2 29  ?GLUCOSE 128*  ?BUN 17  ?CREATININE 0.69  ?CALCIUM 9.5  ? ?GFR: ?Estimated Creatinine Clearance: 88.3 mL/min (by C-G formula based on SCr of 0.69 mg/dL). ?Liver Function Tests: ?No results for input(s): AST, ALT, ALKPHOS, BILITOT, PROT, ALBUMIN in the last 168 hours. ?No results for input(s): LIPASE, AMYLASE in the last 168 hours. ?No results for input(s): AMMONIA in the last 168 hours. ?Coagulation Profile: ?No results for input(s): INR, PROTIME in the last 168 hours. ?Cardiac Enzymes: ?No results for input(s): CKTOTAL, CKMB, CKMBINDEX, TROPONINI in the last 168 hours. ?BNP (last 3 results) ?No results for input(s): PROBNP in the last 8760 hours. ?HbA1C: ?No results for input(s): HGBA1C in the last 72 hours. ?CBG: ?Recent Labs  ?Lab 02/23/22 ?1933 02/23/22 ?2311 02/24/22 ?0013 02/24/22 ?[id number]  02/24/22 ?0819  ?GLUCAP 121* 125* 123* 127* 151*  ? ?Lipid Profile: ?No results for input(s): CHOL, HDL, LDLCALC, TRIG, CHOLHDL, LDLDIRECT in the last 72 hours. ?Thyroid Function Tests: ?No results for input(s): TSH, T4TOTAL, FREET4, T3FREE, THYROIDAB in the last 72 hours. ?Anemia Panel: ?No results for input(s): VITAMINB12, FOLATE, FERRITIN, TIBC, IRON, RETICCTPCT in the last 72 hours. ?Sepsis Labs: ?Recent Labs  ?Lab 02/20/22 ?0947  ?PROCALCITON <0.10  ? ? ?Recent Results (from the past 240 hour(s))  ?Urine Culture     Status: None  ? Collection Time: 02/18/22  8:35 AM  ? Specimen: Urine, Clean Catch  ?Result Value Ref Range Status  ? Specimen Description URINE, CLEAN CATCH  Final  ? Special Requests NONE  Final  ? Culture   Final  ?  NO GROWTH ?Performed at [facility], [place] [address], [place], [place] [address] ?  ? Report Status 02/19/2022 FINAL  Final  ?Culture, blood (Routine X 2) w Reflex to ID Panel     Status: None (Preliminary result)  ? Collection Time: 02/20/22 11:03 AM  ? Specimen: BLOOD LEFT WRIST  ?Result Value Ref Range Status  ? Specimen Description BLOOD LEFT WRIST  Final  ? Special Requests   Final  ?  BOTTLES DRAWN  AEROBIC AND ANAEROBIC Blood Culture results may not be optimal due to an inadequate volume of blood received in culture bottles  ? Culture   Final  ?  NO GROWTH 4 DAYS ?Performed at [facility], [place] [address] [place], [place] [address] ?  ? Report Status PENDING  Incomplete  ?Culture, blood (Routine X 2) w Reflex to ID Panel     Status: Abnormal  ? Collection Time: 02/20/22 11:04 AM  ? Specimen: BLOOD LEFT HAND  ?Result Value Ref Range Status  ? Specimen Description BLOOD LEFT HAND  Final  ? Special Requests   Final  ?  BOTTLES DRAWN AEROBIC AND ANAEROBIC Blood Culture results may not be optimal due to an inadequate volume of blood received in culture bottles  ? Culture  Setup Time   Final  ?  GRAM POSITIVE COCCI ?IN BOTH AEROBIC AND ANAEROBIC BOTTLES ?Organism ID to  follow ?CRITICAL RESULT CALLED TO, READ BACK BY AND VERIFIED WITH: J. FRENS PHARMD, AT [id number] 02/21/22 D. VANHOOK ?  ? Culture (A)  Final  ?  STAPHYLOCOCCUS EPIDERMIDIS ?THE SIGNIFICANCE OF ISOLATING THIS ORGANISM FROM A SINGLE S

## 2022-02-25 DIAGNOSIS — R1310 Dysphagia, unspecified: Secondary | ICD-10-CM

## 2022-02-25 LAB — COMPREHENSIVE METABOLIC PANEL
ALT: 144 U/L — ABNORMAL HIGH (ref 0–44)
AST: 47 U/L — ABNORMAL HIGH (ref 15–41)
Albumin: 2.7 g/dL — ABNORMAL LOW (ref 3.5–5.0)
Alkaline Phosphatase: 84 U/L (ref 38–126)
Anion gap: 10 (ref 5–15)
BUN: 14 mg/dL (ref 6–20)
CO2: 28 mmol/L (ref 22–32)
Calcium: 9.4 mg/dL (ref 8.9–10.3)
Chloride: 99 mmol/L (ref 98–111)
Creatinine, Ser: 0.54 mg/dL (ref 0.44–1.00)
GFR, Estimated: >60 mL/min (ref >60)
Glucose, Bld: 113 mg/dL — ABNORMAL HIGH (ref 70–99)
Potassium: 4.2 mmol/L (ref 3.5–5.1)
Sodium: 137 mmol/L (ref 135–145)
Total Bilirubin: 0.4 mg/dL (ref 0.3–1.2)
Total Protein: 6.9 g/dL (ref 6.5–8.1)

## 2022-02-25 LAB — CULTURE, BLOOD (ROUTINE X 2): Culture: NO GROWTH

## 2022-02-25 LAB — GLUCOSE, CAPILLARY
Glucose-Capillary: 117 mg/dL — ABNORMAL HIGH (ref 70–99)
Glucose-Capillary: 120 mg/dL — ABNORMAL HIGH (ref 70–99)
Glucose-Capillary: 125 mg/dL — ABNORMAL HIGH (ref 70–99)
Glucose-Capillary: 138 mg/dL — ABNORMAL HIGH (ref 70–99)
Glucose-Capillary: 142 mg/dL — ABNORMAL HIGH (ref 70–99)
Glucose-Capillary: 142 mg/dL — ABNORMAL HIGH (ref 70–99)

## 2022-02-25 LAB — CBC WITH DIFFERENTIAL/PLATELET
Abs Immature Granulocytes: 0.02 10*3/uL (ref 0.00–0.07)
Basophils Absolute: 0 10*3/uL (ref 0.0–0.1)
Basophils Relative: 0 %
Eosinophils Absolute: 0.2 10*3/uL (ref 0.0–0.5)
Eosinophils Relative: 2 %
HCT: 34 % — ABNORMAL LOW (ref 36.0–46.0)
Hemoglobin: 11.2 g/dL — ABNORMAL LOW (ref 12.0–15.0)
Immature Granulocytes: 0 %
Lymphocytes Relative: 22 %
Lymphs Abs: 1.5 10*3/uL (ref 0.7–4.0)
MCH: 26.9 pg (ref 26.0–34.0)
MCHC: 32.9 g/dL (ref 30.0–36.0)
MCV: 81.5 fL (ref 80.0–100.0)
Monocytes Absolute: 0.7 10*3/uL (ref 0.1–1.0)
Monocytes Relative: 10 %
Neutro Abs: 4.7 10*3/uL (ref 1.7–7.7)
Neutrophils Relative %: 66 %
Platelets: 259 10*3/uL (ref 150–400)
RBC: 4.17 MIL/uL (ref 3.87–5.11)
RDW: 14.8 % (ref 11.5–15.5)
WBC: 7.2 10*3/uL (ref 4.0–10.5)
nRBC: 0 % (ref 0.0–0.2)

## 2022-02-25 LAB — MAGNESIUM: Magnesium: 2.1 mg/dL (ref 1.7–2.4)

## 2022-02-25 LAB — PHOSPHORUS: Phosphorus: 4 mg/dL (ref 2.5–4.6)

## 2022-02-25 NOTE — Progress Notes (Signed)
Physical Therapy Treatment ?Patient Details ?Name: Cindy Brooks ?MRN: [id number] ?DOB: May 16, 1971 ?Today's Date: 02/25/2022 ? ? ?History of Present Illness Pt is a 51 y.o. female with history of HTN, TIA, ETOH abuse and medication noncompliance presenting to the ED after she was found by her family to be unresponsive in her car on 4/8. 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 Intubated 4/9. Tracheostormy 4/16. ? ?  ?PT Comments  ? ? Patient progressing to OOB this session, though less interactive than last session.  She was lethargic and difficult to rouse for eye opening.  She did move her feet intermittently.  She would not squeeze to command.  Remains appropriate for SNF level rehab at d/c.  PT will continue to follow.   ?Recommendations for follow up therapy are one component of a multi-disciplinary discharge planning process, led by the attending physician.  Recommendations may be updated based on patient status, additional functional criteria and insurance authorization. ? ?Follow Up Recommendations ? Skilled nursing-short term rehab (<3 hours/day) ?  ?  ?Assistance Recommended at Discharge Frequent or constant Supervision/Assistance  ?Patient can return home with the following Two people to help with walking and/or transfers;Two people to help with bathing/dressing/bathroom ?  ?Equipment Recommendations ? Other (comment) (TBA)  ?  ?Recommendations for Other Services   ? ? ?  ?Precautions / Restrictions Precautions ?Precautions: Fall ?Precaution Comments: trach/PEG  ?  ? ?Mobility ? Bed Mobility ?Overal bed mobility: Needs Assistance ?Bed Mobility: Supine to Sit ?  ?  ?Supine to sit: HOB elevated, Total assist ?  ?  ?General bed mobility comments: up to EOB wtih +1 total A ?  ? ?Transfers ?Overall transfer level: Needs assistance ?   ?Transfers: Bed to chair/wheelchair/BSC ?  ?  ?  ?  ?  ?  ?General transfer comment: lifted from sitting using maximove to recliner ?Transfer via Lift Equipment: [place] ? ?Ambulation/Gait ?  ?  ?  ?  ?  ?  ?  ?  ? ? ?Stairs ?  ?  ?  ?  ?  ? ? ?Wheelchair Mobility ?  ? ?Modified Rankin (Stroke Patients Only) ?Modified Rankin (Stroke Patients Only) ?Pre-Morbid Rankin Score: No symptoms ?Modified Rankin: Severe disability ? ? ?  ?Balance Overall balance assessment: Needs assistance ?Sitting-balance support: Feet supported ?Sitting balance-Leahy Scale: Poor ?Sitting balance - Comments: min to mod A for balance, pt assist at times to right head ?  ?  ?  ?  ?  ?  ?  ?  ?  ?  ?  ?  ?  ?  ?  ?  ? ?  ?Cognition Arousal/Alertness: Lethargic ?Behavior During Therapy: Flat affect ?Overall Cognitive Status: Impaired/Different from baseline ?Area of Impairment: Attention, Following commands, Problem solving ?  ?  ?  ?  ?  ?  ?  ?  ?  ?Current Attention Level: Focused ?  ?Following Commands: Follows one step commands inconsistently ?  ?  ?Problem Solving: Slow processing, Decreased initiation ?General Comments: intermittent following commands; would not squeeze hands, but did wiggle toes ?  ?  ? ?  ?Exercises Other Exercises ?Other Exercises: PROM UE/LE in supine prior to sitting ? ?  ?General Comments General comments (skin integrity, edema, etc.): on trach collar 28%, coughing x 2 ?  ?  ? ?Pertinent Vitals/Pain Pain Assessment ?Pain Assessment: Faces ?Faces Pain Scale:  Hurts little more ?Pain Location: grimacing with ROM of extremties ?Pain Descriptors / Indicators: Grimacing ?Pain Intervention(s): Monitored during session, Repositioned  ? ? ?Home Living   ?  ?  ?  ?  ?  ?  ?  ?  ?  ?   ?  ?Prior Function    ?  ?  ?   ? ?PT Goals (current goals can now be found in the care plan section) Progress towards PT goals: Progressing toward goals ? ?  ?Frequency ? ? ? Min 2X/week ? ? ? ?  ?PT Plan Current plan remains appropriate   ? ? ?Co-evaluation   ?  ?  ?  ?  ? ?  ?AM-PAC PT "6 Clicks" Mobility   ?Outcome Measure ? Help needed turning from your back to your side while in a flat bed without using bedrails?: Total ?Help needed moving from lying on your back to sitting on the side of a flat bed without using bedrails?: Total ?Help needed moving to and from a bed to a chair (including a wheelchair)?: Total ?Help needed standing up from a chair using your arms (e.g., wheelchair or bedside chair)?: Total ?Help needed to walk in hospital room?: Total ?Help needed climbing 3-5 steps with a railing? : Total ?6 Click Score: 6 ? ?  ?End of Session Equipment Utilized During Treatment: Oxygen ?Activity Tolerance: Patient tolerated treatment well ?Patient left: in chair;with call bell/phone within reach ?Nurse Communication: Need for lift equipment;Mobility status ?PT Visit Diagnosis: Other abnormalities of gait and mobility (R26.89);Other symptoms and signs involving the nervous system (R29.898) ?  ? ? ?Time: [id number] ?PT Time Calculation (min) (ACUTE ONLY): 29 min ? ?Charges:  $Therapeutic Exercise: 8-22 mins ?$Therapeutic Activity: 8-22 mins          ?          ? ?Magda Kiel, PT ?Acute Rehabilitation Services ?[id number] ?[phone] ?02/25/2022 ? ? ? ?Reginia Naas ?02/25/2022, 5:12 PM ? ?

## 2022-02-25 NOTE — Progress Notes (Signed)
?PROGRESS NOTE ? ? ? ?Cindy Brooks  YFV:[id number] DOB: [birth year] DOA: 01/20/2022 ?PCP: Pcp, No  ? ? ?Brief Narrative:  ?Cindy Brooks is a 51 year old female with past medical history significant for uncontrolled hypertension, history of TIA who presented to [facility] ED on 4/9 via EMS after being found unresponsive.  Patient was apparently found unresponsive by family, EMS was called and brought to ED. Patient was initially admitted to the hospital service for acute encephalopathy and neurology was consulted.  ?Additionally, given that patient was obtunded with loud respirations and copious secretions with difficulty controlling her airway and clearing out secretions, PCCM was consulted and patient was transferred to the intensive care unit and subsequently intubated on 4/9.  ? ?SIGNIFICANT HOSPITAL EVENTS.  ?4/9 Admit  ?4/12 ongoing high dose thiamine for possible Wernike's encephalopathy  ?4/13 IV sedation stopped, phenobarbital wean ?4/17 still not waking up. Trach placed.  ?4/18 PEG placed. Spiking fever. Lactate spiked. Got fluid. Cleared.  ?4/22 Tolerated 6hrs of ATC day prior. No major events overnight, appears that stroke had GOC meeting with family 4/21 with no decision made ?4/23 No issues overnight, on vent this am, remains unresponsive  ?4/27 changed to propranolol from metoprolol for neuro-storming. Started on vanc and cefepime for possible HCAP  ?4/28 trial on open ended trach collar ?5/2 patient transferred to [employer].  ?5/14 waking up and following simple commands. ?5/15 downsized tracheostomy to #4 ? ?  ? ?Assessment & Plan: ?  ?Acute metabolic encephalopathy present on admission secondary to multifactorial causes, Thalamic CVA.  suspected Warnicke's encephalopathy. ? 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 ?Received high-dose IV thiamine for possible Warnicke's encephalopathy.  Then on phenobarbital which was weaned off. ?Now with trach collar, PEG tube, and waiting for placement. ?Remains on amantadine, gabapentin.   ?Anticardiolipin IgM positive, lupus anticoagulant PTT prolonged.  Neurology recommended to treat with Plavix. ?Requested pulm to downsize trach today, changed to #4 cuffless. ?Speech therapy consulted, patient may start trying Passy-Muir valve. ?If successful capping trial, may be candidate for decannulation soon. ? ?Acute respiratory failure secondary to HCAP/obesity hypoventilation syndrome: ?She was intubated and unable to come off the ventilator.  Tracheostomy and currently downsized to 4 and remains on trach collar.  Stable.  Completed antibiotics. ?Repeat chest x-ray with chronic atelectasis.  No evidence of new findings. ?Downsizing as above. ? ?Dysphagia: Secondary to above.  On full dose tube feeding.  Not ready for oral trial yet. ? ?Essential hypertension blood pressure stable on amlodipine and propranolol. ? ?Febrile episode: Noted to have intermittent febrile episode.  Improved now. ?Continue respiratory therapy and tracheostomy care.   ? ? ? ?DVT prophylaxis: SCDs Start: 01/20/22 1012 ? ? ?Code Status: Full code ?Family Communication: None. ?Disposition Plan: Status is: Inpatient ?Remains inpatient appropriate because: Unsafe discharge plan, waiting for SNF availability.  Medically stable to transfer to SNF. ?  ? ? ?Consultants:  ?Neurology ?Critical care ? ?Procedures:  ?Multiple as above ? ?Antimicrobials:  ?Completed as above ? ? ?Subjective: ? ?Patient seen and examined.  Follows commands with the right hand.  She is able to move her both legs but excluding gravity.  Appropriately responds to questions.  Unable to verbalize. ? ?Objective: ?Vitals:  ? 02/25/22 0841 02/25/22 1143 02/25/22 1217 02/25/22  1230   ?BP: (!) 133/93 (!) 140/95  (!) 137/92  ?Pulse: 89 86 91 89  ?Resp: 20 20 (!) 25 20  ?Temp: 98 ?F (36.7 ?C) 97.7 ?F (36.5 ?C)    ?TempSrc:      ?SpO2: 96% 96% 95% 93%  ?Weight:      ?Height:      ? ? ?Intake/Output Summary (Last 24 hours) at 02/25/2022 1315 ?Last data filed at 02/25/2022 1200 ?Gross per 24 hour  ?Intake --  ?Output 1400 ml  ?Net -1400 ml  ? ?Filed Weights  ? 02/23/22 0456 02/24/22 0500 02/25/22 0500  ?Weight: 82.7 kg 84.3 kg 82.6 kg  ? ? ?Examination: ? ?General exam: Ill-appearing, not in any distress.  Frail and debilitated. ?Respiratory system: Bilateral poor air entry.  Upper airway sounds. ?SpO2: 93 % ?O2 Flow Rate (L/min): 5 L/min ?FiO2 (%): 21 %  ?Cardiovascular system: S1 & S2 heard, RRR.  ?Gastrointestinal system: Soft.  Nontender.  PEG tube in place.   ?Central nervous system: Follows some commands on the right upper extremity.  She was able to appropriately respond to commands.  Able to move both legs excluding gravity. ?Extremities: Anasarca. ? ? ?Data Reviewed: I have personally reviewed following labs and imaging studies ? ?CBC: ?Recent Labs  ?Lab 02/20/22 ?8242 02/25/22 ?3536  ?WBC 9.4 7.2  ?NEUTROABS 6.6 4.7  ?HGB 11.1* 11.2*  ?HCT 32.9* 34.0*  ?MCV 80.4 81.5  ?PLT 294 259  ? ?Basic Metabolic Panel: ?Recent Labs  ?Lab 02/25/22 ?0212  ?NA 137  ?K 4.2  ?CL 99  ?CO2 28  ?GLUCOSE 113*  ?BUN 14  ?CREATININE 0.54  ?CALCIUM 9.4  ?MG 2.1  ?PHOS 4.0  ? ?GFR: ?Estimated Creatinine Clearance: 87.5 mL/min (by C-G formula based on SCr of 0.54 mg/dL). ?Liver Function Tests: ?Recent Labs  ?Lab 02/25/22 ?0212  ?AST 47*  ?ALT 144*  ?ALKPHOS 84  ?BILITOT 0.4  ?PROT 6.9  ?ALBUMIN 2.7*  ? ?No results for input(s): LIPASE, AMYLASE in the last 168 hours. ?No results for input(s): AMMONIA in the last 168 hours. ?Coagulation Profile: ?No results for input(s): INR, PROTIME in the last 168 hours. ?Cardiac Enzymes: ?No results for input(s): CKTOTAL, CKMB, CKMBINDEX, TROPONINI in the last 168 hours. ?BNP (last 3  results) ?No results for input(s): PROBNP in the last 8760 hours. ?HbA1C: ?No results for input(s): HGBA1C in the last 72 hours. ?CBG: ?Recent Labs  ?Lab 02/24/22 ?1933 02/24/22 ?2324 02/25/22 ?1443 02/25/22 ?1540 02/25/22 ?1143  ?GLUCAP 141* 125* 117* 120* 138*  ? ?Lipid Profile: ?No results for input(s): CHOL, HDL, LDLCALC, TRIG, CHOLHDL, LDLDIRECT in the last 72 hours. ?Thyroid Function Tests: ?No results for input(s): TSH, T4TOTAL, FREET4, T3FREE, THYROIDAB in the last 72 hours. ?Anemia Panel: ?No results for input(s): VITAMINB12, FOLATE, FERRITIN, TIBC, IRON, RETICCTPCT in the last 72 hours. ?Sepsis Labs: ?Recent Labs  ?Lab 02/20/22 ?0867  ?PROCALCITON <0.10  ? ? ?Recent Results (from the past 240 hour(s))  ?Urine Culture     Status: None  ? Collection Time: 02/18/22  8:35 AM  ? Specimen: Urine, Clean Catch  ?Result Value Ref Range Status  ? Specimen Description URINE, CLEAN CATCH  Final  ? Special Requests NONE  Final  ? Culture   Final  ?  NO GROWTH ?Performed at [facility], [address] [place], [place] [address] ?  ? Report Status 02/19/2022 FINAL  Final  ?Culture, blood (Routine X 2) w Reflex to ID Panel  Status: None  ? Collection Time: 02/20/22 11:03 AM  ? Specimen: BLOOD LEFT WRIST  ?Result Value Ref Range Status  ? Specimen Description BLOOD LEFT WRIST  Final  ? Special Requests   Final  ?  BOTTLES DRAWN AEROBIC AND ANAEROBIC Blood Culture results may not be optimal due to an inadequate volume of blood received in culture bottles  ? Culture   Final  ?  NO GROWTH 5 DAYS ?Performed at [facility], [address] [place], [place] [address] ?  ? Report Status 02/25/2022 FINAL  Final  ?Culture, blood (Routine X 2) w Reflex to ID Panel     Status: Abnormal  ? Collection Time: 02/20/22 11:04 AM  ? Specimen: BLOOD LEFT HAND  ?Result Value Ref Range Status  ? Specimen Description BLOOD LEFT HAND  Final  ? Special Requests   Final  ?  BOTTLES DRAWN AEROBIC AND ANAEROBIC Blood Culture  results may not be optimal due to an inadequate volume of blood received in culture bottles  ? Culture  Setup Time   Final  ?  GRAM POSITIVE COCCI ?IN BOTH AEROBIC AND ANAEROBIC BOTTLES ?Organism ID to follow

## 2022-02-25 NOTE — Progress Notes (Signed)
Patient ID: Cindy Brooks, female   DOB: 1971/06/19, 51 y.o.   MRN: [id number]    Progress Note from the Palliative Medicine Team at [facility]   Patient Name: Cindy Brooks        Date: 02/25/2022 DOB: [birth year]  Age: 51 y.o. MRN#: [id number] Attending Physician: Dorcas Carrow, MD Primary Care Physician: Pcp, No Admit Date: 01/20/2022   Medical records reviewed   51 y.o. female  admitted on  01/20/2022 having been   found outside in her car by family on 4/8 and was moved to the house and left for 5 hours at which time she did not move, transported by EMS to [facility] emergency room, patient was obtunded with loud respirations, copious secretions, difficulty protecting her airway, was intubated      Significant Hospital events   4/9 Admit       -CT head No acute abnormality.        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 Trach placed.  4/18 PEG placed. Spiking fever. Lactate spiked. Got fluid. Cleared.  4/22 Tolerated 6hrs of ATC day prior. No major events overnight, appears that stroke had GOC meeting with family 4/21 with no decision made 4/27 remains on the , unsuccessful wean, remains unresponsive  4/28 trial on trach collar 02/12/22 remains on trach collar, unresponsive to tactile, copious secretions  02-18-22 I was unable to get any response today with commands or gentle touch  02-25-22 patient  was following simple  commands    Family face ongoing treatment option decisions, advanced directive decisions and anticipatory care needs.   I spoke to [employer] by telephone today.  Continued education regarding current medical situation, and concern for long-term meaningful recovery secondary to thalamic stroke  Daughter tells me that she and her siblings are seeing signs of improvement; makes them optimistic for the future Education offered on the importance of seeing consistent, meaningful responses.   Education offered on the likely long-term trajectory trajectory in a patient with serious brain injury.  Continued education regarding increased risk of infection and skin breakdown.  Emotional support offered.     Plan of Care: -Full code -Family is open to all offered and available medical interventions to prolong life. -Family verbalized understanding that next steps in transition of care will be a skilled nursing facility for long-term care.  This family will needs clear, transparent and consistent education  regarding patient's long-term prognosis.  I encouraged her children to continue conversations among themselves and with the medical team as they make decisions into the future and always keeping the patient at the center of those decisions.  Questions and concerns addressed     PMT will continue to support holistically  Lorinda Creed NP  Palliative Medicine Team Team Phone # [phone] Pager [phone]

## 2022-02-25 NOTE — Progress Notes (Signed)
? ?  NAME:  Cindy Brooks, MRN:  [id number], DOB:  July 20, 1971, LOS: 16 ?ADMISSION DATE:  01/20/2022, CONSULTATION DATE:  4/9 ?REFERRING MD:  Hal Hope, CHIEF COMPLAINT:  Confusion  ? ?History of Present Illness:  ?51 y/o female admitted on 4/9 in the setting of a thalamic stroke, treated for possible Wernicke's encphalopathy as well.  She required intubation for airway protection on admission and had a tracheostomy placed on 4/16.  PCCM is following for tracheostomy management. ? ?Pertinent  Medical History  ?Hypertension ?TIA ? 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 Trach placed.  ?4/18 PEG placed. Spiking fever. Lactate spiked. Got fluid. Cleared.  ?4/22 Tolerated 6hrs of ATC day prior. No major events overnight, appears that stroke had [place] meeting with family 4/21 with no decision made ?4/23 No issues overnight, on vent this am, remains unresponsive  ?4/27 changed to propranolol from metoprolol for neuro-storming. Started on vanc and cefepime for possible HCAP  ?4/28 trial on open ended trach collar ?4/30  trach exchange 6 cuffless ? ?Interim History / Subjective:  ?No events.  Afebrile. ? ?Objective   ?Blood pressure (!) 128/92, pulse 84, temperature 99.2 ?F (37.3 ?C), temperature source Axillary, resp. rate 20, height 5\' 4"  (1.626 m), weight 82.6 kg, SpO2 93 %. ?   ?FiO2 (%):  [21 %-28 %] 21 %  ? ?Intake/Output Summary (Last 24 hours) at 02/25/2022 0829 ?Last data filed at 02/25/2022 0343 ?Gross per 24 hour  ?Intake --  ?Output 800 ml  ?Net -800 ml  ? ? ?Filed Weights  ? 02/23/22 0456 02/24/22 0500 02/25/22 0500  ?Weight: 82.7 kg 84.3 kg 82.6 kg  ? ? ?Examination: ? ?Resting in bed ?Trach in place ?Does follow commands on R ?This appears to be an improvement ? ?Resolved  Hospital Problem list   ? ? 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 ?Discussion ?Seems to have started to improve.  May be decannulation candidate. ? ?Plan ?- Change to 4-0 cuffless ?- Will consider capping trial starting tomorrow ?- Usual trach bundle ?- Will follow ? ?Best Practice (right click and "Reselect all SmartList Selections" daily)  ? ?Erskine Emery MD PCCM ? ?

## 2022-02-25 NOTE — Progress Notes (Signed)
SLP Cancellation Note ? ?Patient Details ?Name: Cindy Brooks ?MRN: [id number] ?DOB: 1971-05-07 ? ? ?Cancelled treatment:       Reason Eval/Treat Not Completed: Patient at procedure or test/unavailable. Pt having trach changed to 4 cuffless. SLP will f/u tomorrow.  ? ? ?Mays Paino, Riley Nearing ?02/25/2022, 2:02 PM ?

## 2022-02-25 NOTE — Procedures (Signed)
Tracheostomy Change Note ? ?Patient Details:   ?Name: Cindy Brooks ?DOB: 04/21/1971 ?MRN: [id number] ?   ?Airway Documentation: ?   ? ?Evaluation ? O2 sats: stable throughout ?Complications: No apparent complications ?Patient did tolerate procedure well. ?Bilateral Breath Sounds: Diminished, Clear ?  ?Patient trach changed to #4 shiley cuffless per MD order. RTx2 assist with trach change. Good color change on CO2 and new trach ties secure. Patient tolerated procedure well.  ? [name] ?02/25/2022, 12:18 PM ?

## 2022-02-26 DIAGNOSIS — I6381 Other cerebral infarction due to occlusion or stenosis of small artery: Secondary | ICD-10-CM | POA: Diagnosis not present

## 2022-02-26 LAB — BLOOD GAS, ARTERIAL
Acid-Base Excess: 5.6 mmol/L — ABNORMAL HIGH (ref 0.0–2.0)
Bicarbonate: 29.8 mmol/L — ABNORMAL HIGH (ref 20.0–28.0)
Drawn by: 28099
O2 Saturation: 95.5 %
Patient temperature: 37
pCO2 arterial: 41 mmHg (ref 32–48)
pH, Arterial: 7.47 — ABNORMAL HIGH (ref 7.35–7.45)
pO2, Arterial: 73 mmHg — ABNORMAL LOW (ref 83–108)

## 2022-02-26 LAB — GLUCOSE, CAPILLARY
Glucose-Capillary: 86 mg/dL (ref 70–99)
Glucose-Capillary: 82 mg/dL (ref 70–99)
Glucose-Capillary: 94 mg/dL (ref 70–99)
Glucose-Capillary: 103 mg/dL — ABNORMAL HIGH (ref 70–99)
Glucose-Capillary: 134 mg/dL — ABNORMAL HIGH (ref 70–99)
Glucose-Capillary: 148 mg/dL — ABNORMAL HIGH (ref 70–99)

## 2022-02-26 NOTE — TOC Progression Note (Signed)
Transition of Care (TOC) - Progression Note  ? ? ?Patient Details  ?Name: Cindy Brooks ?MRN: [id number] ?Date of Birth: 09-08-1971 ? ?Transition of Care (TOC) CM/SW Contact  ?Eduard Roux, LCSW ?Phone Number: ?02/26/2022, 8:57 AM ? ?Clinical Narrative:    ? ?Patient has no bed offers- TOC will continue to follow and assist with discharge planning. ? ?Antony Blackbird, MSW, LCSW ?Clinical Social Worker ? ? ? ?Expected Discharge Plan: Skilled Nursing Facility ?Barriers to Discharge: Inadequate or no insurance, No SNF bed (Trach less than 44 days old) ? ?Expected Discharge Plan and Services ?Expected Discharge Plan: Skilled Nursing Facility ?In-house Referral: Clinical Social Work ?  ?Post Acute Care Choice: Skilled Nursing Facility ?Living arrangements for the past 2 months: Single Family Home ?                ?  ?  ?  ?  ?  ?  ?  ?  ?  ?  ? ? ?Social Determinants of Health (SDOH) Interventions ?  ? ?Readmission Risk Interventions ?   ? View : No data to display.  ?  ?  ?  ? ? ?

## 2022-02-26 NOTE — Progress Notes (Signed)
?PROGRESS NOTE ? ? ? ?Cindy Brooks  [id number] DOB: 12/12/1970 DOA: 01/20/2022 ?PCP: Pcp, No  ? ? ?Brief Narrative:  ?Cindy Brooks is a 51 year old female with past medical history significant for uncontrolled hypertension, history of TIA who presented to [facility] ED on 4/9 via EMS after being found unresponsive.  Patient was apparently found unresponsive by family, EMS was called and brought to ED. Patient was initially admitted to the hospital service for acute encephalopathy and neurology was consulted.  ?Additionally, given that patient was obtunded with loud respirations and copious secretions with difficulty controlling her airway and clearing out secretions, PCCM was consulted and patient was transferred to the intensive care unit and subsequently intubated on 4/9.  ? ?SIGNIFICANT HOSPITAL EVENTS.  ?4/9 Admit  ?4/12 ongoing high dose thiamine for possible Wernike's encephalopathy  ?4/13 IV sedation stopped, phenobarbital wean ?4/17 still not waking up. Trach placed.  ?4/18 PEG placed. Spiking fever. Lactate spiked. Got fluid. Cleared.  ?4/22 Tolerated 6hrs of ATC day prior. No major events overnight, appears that stroke had [place] meeting with family 4/21 with no decision made ?4/23 No issues overnight, on vent this am, remains unresponsive  ?4/27 changed to propranolol from metoprolol for neuro-storming. Started on vanc and cefepime for possible HCAP  ?4/28 trial on open ended trach collar ?5/2 patient transferred to [facility].  ?5/14 waking up and following simple commands. ?5/15 downsized tracheostomy to #4 ?5/16 tracheostomy capping trials. ? ?  ? ?Assessment & Plan: ?  ?Acute metabolic encephalopathy present on admission secondary to multifactorial causes, Thalamic CVA.  suspected Warnicke's encephalopathy. ? ?MRI brain 4/11, patchy restricted diffusion both thalami and midbrain ?CT angiogram head and neck 4/11, severe bilateral P1 and right P2 PCA stenosis, bilateral ophthalmic infarct, severe  left proximal M2 MCA branch stenosis, severe right A2 ACA branch stenosis, no evidence of dural venous thrombosis. ?Received high-dose IV thiamine for possible Warnicke's encephalopathy.  Then on phenobarbital which was weaned off. ?Now with trach collar, PEG tube, and waiting for placement. ?Remains on amantadine, gabapentin.   ?Anticardiolipin IgM positive, lupus anticoagulant PTT prolonged.  Neurology recommended to treat with Plavix. ?We consider pulmonary, trach was downsized to #4 And Today capping trial. ?If successful capping trial, may be candidate for decannulation soon. ? ?Acute respiratory failure secondary to HCAP/obesity hypoventilation syndrome: ?She was intubated and unable to come off the ventilator.  Tracheostomy and currently downsized to 4 and remains on trach collar.  Stable.  Completed antibiotics. ?Repeat chest x-ray with chronic atelectasis.  No evidence of new findings. ?Downsizing as above. ? ?Dysphagia: Secondary to above.  On full dose tube feeding.  Not ready for oral trial yet. ?Once decannulated, will reconsult speech therapy for swallow evaluation.  Even if she swallows, she may not be able to keep up with caloric intake. ? ?Essential hypertension blood pressure stable on amlodipine and propranolol. ? ? ? ?DVT prophylaxis: SCDs Start: 01/20/22 1012 ? ? ?Code Status: Full code ?Family Communication: None. ?Disposition Plan: Status is: Inpatient ?Remains inpatient appropriate because: Unsafe discharge plan, waiting for SNF availability.  Medically stable to transfer to SNF. ?  ? ? ?Consultants:  ?Neurology ?Critical care ? ?Procedures:  ?Multiple as above ? ?Antimicrobials:  ?Completed as above ? ? ?Subjective: ? ?Patient seen and examined.  Even though she is not able to verbalize, she tries to respond.  Today, she was using all 4 extremities.  Left side is weaker than the right side.  She was able to move her left  hand that she demonstrated first time today. ? ?Objective: ?Vitals:  ?  02/26/22 0750 02/26/22 0903 02/26/22 1055 02/26/22 1132  ?BP: (!) 148/94  (!) 153/94   ?Pulse: 93 92 99 92  ?Resp: (!) 28 (!) 26 (!) 24 (!) 23  ?Temp: 98.2 ?F (36.8 ?C)  98 ?F (36.7 ?C)   ?TempSrc: Oral  Oral   ?SpO2: 92% 92% 94% 93%  ?Weight:      ?Height:      ? ? ?Intake/Output Summary (Last 24 hours) at 02/26/2022 1356 ?Last data filed at 02/26/2022 1057 ?Gross per 24 hour  ?Intake 15010 ml  ?Output 1950 ml  ?Net 13060 ml  ? ?Filed Weights  ? 02/24/22 0500 02/25/22 0500 02/26/22 0509  ?Weight: 84.3 kg 82.6 kg 80.4 kg  ? ? ?Examination: ? ?General exam: Ill-appearing, not in any distress.  Frail and debilitated. ?Respiratory system: Bilateral poor air entry.  Upper airway sounds.  On Trach Collar, cap applied today. ?SpO2: 93 % ?O2 Flow Rate (L/min): 5 L/min ?FiO2 (%): 21 %  ?Cardiovascular system: S1 & S2 heard, RRR.  ?Gastrointestinal system: Soft.  Nontender.  PEG tube in place.   ?Central nervous system: Follows some commands on the right upper extremity.  She was able to appropriately respond to commands.  Able to move both arms and legs. ? ? ?Data Reviewed: I have personally reviewed following labs and imaging studies ? ?CBC: ?Recent Labs  ?Lab 02/20/22 ?[id number] 02/25/22 ?[id number]  ?WBC 9.4 7.2  ?NEUTROABS 6.6 4.7  ?HGB 11.1* 11.2*  ?HCT 32.9* 34.0*  ?MCV 80.4 81.5  ?PLT 294 259  ? ?Basic Metabolic Panel: ?Recent Labs  ?Lab 02/25/22 ?0212  ?NA 137  ?K 4.2  ?CL 99  ?CO2 28  ?GLUCOSE 113*  ?BUN 14  ?CREATININE 0.54  ?CALCIUM 9.4  ?MG 2.1  ?PHOS 4.0  ? ?GFR: ?Estimated Creatinine Clearance: 86.3 mL/min (by C-G formula based on SCr of 0.54 mg/dL). ?Liver Function Tests: ?Recent Labs  ?Lab 02/25/22 ?0212  ?AST 47*  ?ALT 144*  ?ALKPHOS 84  ?BILITOT 0.4  ?PROT 6.9  ?ALBUMIN 2.7*  ? ?No results for input(s): LIPASE, AMYLASE in the last 168 hours. ?No results for input(s): AMMONIA in the last 168 hours. ?Coagulation Profile: ?No results for input(s): INR, PROTIME in the last 168 hours. ?Cardiac Enzymes: ?No results for  input(s): CKTOTAL, CKMB, CKMBINDEX, TROPONINI in the last 168 hours. ?BNP (last 3 results) ?No results for input(s): PROBNP in the last 8760 hours. ?HbA1C: ?No results for input(s): HGBA1C in the last 72 hours. ?CBG: ?Recent Labs  ?Lab 02/25/22 ?2017 02/25/22 ?2335 02/26/22 ?0320 02/26/22 ?[id number] 02/26/22 ?1052  ?GLUCAP 125* 142* 86 82 94  ? ?Lipid Profile: ?No results for input(s): CHOL, HDL, LDLCALC, TRIG, CHOLHDL, LDLDIRECT in the last 72 hours. ?Thyroid Function Tests: ?No results for input(s): TSH, T4TOTAL, FREET4, T3FREE, THYROIDAB in the last 72 hours. ?Anemia Panel: ?No results for input(s): VITAMINB12, FOLATE, FERRITIN, TIBC, IRON, RETICCTPCT in the last 72 hours. ?Sepsis Labs: ?Recent Labs  ?Lab 02/20/22 ?0947  ?PROCALCITON <0.10  ? ? ?Recent Results (from the past 240 hour(s))  ?Urine Culture     Status: None  ? Collection Time: 02/18/22  8:35 AM  ? Specimen: Urine, Clean Catch  ?Result Value Ref Range Status  ? Specimen Description URINE, CLEAN CATCH  Final  ? Special Requests NONE  Final  ? Culture   Final  ?  NO GROWTH ?Performed at [facility], [place] [address]  [place], [place] [address] ?  ? Report Status 02/19/2022 FINAL  Final  ?Culture, blood (Routine X 2) w Reflex to ID Panel     Status: None  ? Collection Time: 02/20/22 11:03 AM  ? Specimen: BLOOD LEFT WRIST  ?Result Value Ref Range Status  ? Specimen Description BLOOD LEFT WRIST  Final  ? Special Requests   Final  ?  BOTTLES DRAWN AEROBIC AND ANAEROBIC Blood Culture results may not be optimal due to an inadequate volume of blood received in culture bottles  ? Culture   Final  ?  NO GROWTH 5 DAYS ?Performed at [facility], [place] [address], [place], [place] [address] ?  ? Report Status 02/25/2022 FINAL  Final  ?Culture, blood (Routine X 2) w Reflex to ID Panel     Status: Abnormal  ? Collection Time: 02/20/22 11:04 AM  ? Specimen: BLOOD LEFT HAND  ?Result Value Ref Range Status  ? Specimen Description BLOOD LEFT HAND  Final  ? Special  Requests   Final  ?  BOTTLES DRAWN AEROBIC AND ANAEROBIC Blood Culture results may not be optimal due to an inadequate volume of blood received in culture bottles  ? Culture  Setup Time   Final  ?  GRAM POSITI

## 2022-02-26 NOTE — Progress Notes (Signed)
Nutrition Follow-up ? ?DOCUMENTATION CODES:  ? ?Not applicable ? ?INTERVENTION:  ? ?- RD will monitor for diet advancement and adjust tube feeding regimen as appropriate ? ?Continue tube feeds via PEG tube: ?- Jevity 1.5 @ 55 ml/hr (1320 ml/day) ?- ProSource TF 45 ml daily ?- Free water flushes of 250 ml q 6 hours ? ?Tube feeding regimen provides 2020 kcal, 95 grams of protein, and 1003 ml of H2O. ? ?Total free water with flushes: 2003 ml ? ?- MVI with minerals daily per tube ? ?NUTRITION DIAGNOSIS:  ? ?Inadequate oral intake related to inability to eat as evidenced by NPO status. ? ?Ongoing ? ?GOAL:  ? ?Patient will meet greater than or equal to 90% of their needs ? ?Met via TF at goal ? ?MONITOR:  ? ?Labs, Weight trends, TF tolerance, Skin ? ?REASON FOR ASSESSMENT:  ? ?Ventilator, Consult ?Enteral/tube feeding initiation and management ? ?ASSESSMENT:  ? ?51 year old female who presented to the ED on 4/09 with AMS. PMH of EtOH abuse, HTN, TIA, medication noncompliance. Pt admitted with acute encephalopathy. ? ?04/09 - pt intubated for airway protection ?04/17 - s/p trach ?04/18 - s/p PEG ?04/19 - pt with new watershed infarcts in B frontal, B parietal, and L temporal lobes ?05/16 - trach capped ?05/17 - SLP evaluation with recommendation for NPO until MBS is completed ? ?Noted pt's mental status has improved significantly. Pt worked with SLP today. Plan is for MBS within next 24-48 hours. ? ?RD attempted to speak with pt at bedside. Pt sleeping soundly and did not awaken to RD voice or touch. RD to monitor for diet advancement and adjust tube feeding regimen as appropriate. Unsure whether pt will be able to meet her needs via PO intake alone even with diet advanced. ? ?Current TF: Jevity 1.5 @ 55 ml/hr, ProSource TF 45 ml daily, free water flushes 250 ml q 6 hours ? ?Admit weight: 78.7 kg ?Current weight: 80.3 kg ? ?Pt with non-pitting edema to BUE. ? ?Medications reviewed and include: colace, folic acid, SSI q 4  hours, novolog 4 units q 4 hours, MVI with minerals daily, scopolamine patch, senna, thiamine ? ?Vitamin/Mineral Profile: ?Vitamin B6: 4.6 (WNL) ?Vitamin B12: 930 (H) ?Vitamin A: 44.9 (WNL) ?Zinc: 69 (WNL)  ?CRP: 7.6 (H) ?Vitamin B1: 365.8 (H) ? ?Labs reviewed. CBG's: 94-148 x 24 hours ? ?UOP: 1550 ml x 24 hours ? ?Diet Order:   ?Diet Order   ? ?       ?  Diet NPO time specified  Diet effective now       ?  ? ?  ?  ? ?  ? ? ?EDUCATION NEEDS:  ? ?No education needs have been identified at this time ? ?Skin:  Skin Assessment: ?Skin Integrity Issues: ?Stage II: labia ? ?Last BM:  02/27/22 medium type 6 ? ?Height:  ? ?Ht Readings from Last 1 Encounters:  ?01/20/22 5' 4"  (1.626 m)  ? ? ?Weight:  ? ?Wt Readings from Last 1 Encounters:  ?02/27/22 80.3 kg  ? ? ?BMI:  Body mass index is 30.39 kg/m?. ? ?Estimated Nutritional Needs:  ? ?Kcal:  1800-2000 ? ?Protein:  90-110 grams ? ?Fluid:  1.8-2.0 L ? ? ? ?Gustavus Bryant, MS, RD, LDN ?Inpatient Clinical Dietitian ?Please see AMiON for contact information. ? ?

## 2022-02-26 NOTE — Progress Notes (Signed)
? ?  NAME:  Cindy Brooks, MRN:  [id number], DOB:  04-01-1971, LOS: 64 ?ADMISSION DATE:  01/20/2022, CONSULTATION DATE:  4/9 ?REFERRING MD:  Hal Hope, CHIEF COMPLAINT:  Confusion  ? ?History of Present Illness:  ?51 y/o female admitted on 4/9 in the setting of a thalamic stroke, treated for possible Wernicke's encphalopathy as well.  She required intubation for airway protection on admission and had a tracheostomy placed on 4/16.  PCCM is following for tracheostomy management. ? ?Pertinent  Medical History  ?Hypertension ?TIA ? 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 Trach placed.  ?4/18 PEG placed. Spiking fever. Lactate spiked. Got fluid. Cleared.  ?4/22 Tolerated 6hrs of ATC day prior. No major events overnight, appears that stroke had [place] meeting with family 4/21 with no decision made ?4/23 No issues overnight, on vent this am, remains unresponsive  ?4/27 changed to propranolol from metoprolol for neuro-storming. Started on vanc and cefepime for possible HCAP  ?4/28 trial on open ended trach collar ?4/30  trach exchange 6 cuffless ? ?Interim History / Subjective:  ?More somnolent this AM. ? ?Objective   ?Blood pressure (!) 148/94, pulse 92, temperature 98.2 ?F (36.8 ?C), temperature source Oral, resp. rate (!) 26, height 5\' 4"  (1.626 m), weight 80.4 kg, SpO2 92 %. ?   ?FiO2 (%):  [21 %] 21 %  ? ?Intake/Output Summary (Last 24 hours) at 02/26/2022 0906 ?Last data filed at 02/26/2022 0516 ?Gross per 24 hour  ?Intake 15010 ml  ?Output 2050 ml  ?Net 12960 ml  ? ? ?Filed Weights  ? 02/24/22 0500 02/25/22 0500 02/26/22 0509  ?Weight: 84.3 kg 82.6 kg 80.4 kg  ? ? ?Examination: ?Resting in bed ?Opens eyes to voice then falls back asleep ?Nonlabored respirations ?4-0 shiley in place with  no secretions ? ?Resolved Hospital Problem list   ? ? 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 ?Discussion ?Seems to have started to improve.  May be decannulation candidate. ? ?Plan ?- Cap trach ?- Check ABG if continues to be somnolent ?- Will follow, potential decannulation later this week ? ?Best Practice (right click and "Reselect all SmartList Selections" daily)  ? ?Erskine Emery MD PCCM ? ?

## 2022-02-26 NOTE — Progress Notes (Signed)
SLP Cancellation Note ? ?Patient Details ?Name: Cindy Brooks ?MRN: [id number] ?DOB: 1971-07-29 ? ? ?Cancelled treatment:        Attempted to see pt for PMSV evaluation with newly downsized 4 cuffless trach.  Tracheostomy capped on arrival with pt tolerating well.  PMV evaluation deferred.  Please place orders for swallowing evaluation and/or cognitive linguistic assessment if indicated.  ? ? ?Lakeithia Rasor E Heer Justiss, MA, CCC-SLP ?Acute Rehabilitation Services ?Office: [phone] ?02/26/2022, 12:20 PM ?

## 2022-02-26 NOTE — Progress Notes (Signed)
Pts trach capped per MD order. Pt is tolerating well at this time. RN aware. RT to continue to monitor. ?

## 2022-02-27 DIAGNOSIS — I6381 Other cerebral infarction due to occlusion or stenosis of small artery: Secondary | ICD-10-CM | POA: Diagnosis not present

## 2022-02-27 LAB — GLUCOSE, CAPILLARY
Glucose-Capillary: 113 mg/dL — ABNORMAL HIGH (ref 70–99)
Glucose-Capillary: 128 mg/dL — ABNORMAL HIGH (ref 70–99)
Glucose-Capillary: 131 mg/dL — ABNORMAL HIGH (ref 70–99)
Glucose-Capillary: 131 mg/dL — ABNORMAL HIGH (ref 70–99)
Glucose-Capillary: 138 mg/dL — ABNORMAL HIGH (ref 70–99)
Glucose-Capillary: 94 mg/dL (ref 70–99)

## 2022-02-27 NOTE — Progress Notes (Signed)
? ?  NAME:  Cindy Brooks, MRN:  [id number], DOB:  [birth year], LOS: 70 ?ADMISSION DATE:  01/20/2022, CONSULTATION DATE:  4/9 ?REFERRING MD:  Hal Hope, CHIEF COMPLAINT:  Confusion  ? ?History of Present Illness:  ?51 y/o female admitted on 4/9 in the setting of a thalamic stroke, treated for possible Wernicke's encphalopathy as well.  She required intubation for airway protection on admission and had a tracheostomy placed on 4/16.  PCCM is following for tracheostomy management. ? ?Pertinent  Medical History  ?Hypertension ?TIA ? 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 Trach placed.  ?4/18 PEG placed. Spiking fever. Lactate spiked. Got fluid. Cleared.  ?4/22 Tolerated 6hrs of ATC day prior. No major events overnight, appears that stroke had [place] meeting with family 4/21 with no decision made ?4/23 No issues overnight, on vent this am, remains unresponsive  ?4/27 changed to propranolol from metoprolol for neuro-storming. Started on vanc and cefepime for possible HCAP  ?4/28 trial on open ended trach collar ?4/30  trach exchange 6 cuffless ?5/16 trach capped ? ?Interim History / Subjective:  ?Per RN, more responsive then before. ?Tolerated trach capping x 24 hours thus far. ?SLP eval today. ? ?Objective   ?Blood pressure 136/80, pulse 87, temperature 98.8 ?F (37.1 ?C), temperature source Oral, resp. rate 19, height 5\' 4"  (1.626 m), weight 80.3 kg, SpO2 92 %. ?   ?FiO2 (%):  [21 %] 21 %  ? ?Intake/Output Summary (Last 24 hours) at 02/27/2022 1055 ?Last data filed at 02/26/2022 2312 ?Gross per 24 hour  ?Intake --  ?Output 1550 ml  ?Net -1550 ml  ? ? ?Filed Weights  ? 02/25/22 0500 02/26/22 0509 02/27/22 0500  ?Weight: 82.6 kg 80.4 kg 80.3 kg  ? ? ?Examination: ?General: Adult female,  resting in bed, in NAD. ?Neuro: Sleepy but opens eyes to voice. ?HEENT: [place]/AT. Sclerae anicteric. Trach capped, no secretions. ?Cardiovascular: RRR, no M/R/G.  ?Lungs: Respirations even and unlabored.  CTA bilaterally, No W/R/R. ?Abdomen: BS x 4, soft, NT/ND.  ?Musculoskeletal: No gross deformities, no edema.  ?Skin: Intact, warm, no rashes. ? ? ? 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 ?Discussion ?Seems to have started to improve.  May be decannulation candidate. ? ?Plan ?- Continue trach capping ?- Reassess 5/18 which would be 48 hours, if no issues with secretions etc then can consider decannulation esp if does well with SLP eval today ?- SLP eval today ?- Check ABG if continues to be somnolent ?- Will follow, potential decannulation later this week ? ? ?Montey Hora, PA - C ?[facility] ?For pager details, please see AMION or use Epic chat  ?After 1900, please call [facility] for cross coverage needs ?02/27/2022, 10:58 AM ? ? ?

## 2022-02-27 NOTE — Progress Notes (Signed)
Per SLP noted on 02/26/22 order needed for swallow eval and cognitive assessement if indicated. Verbal order received per Dr. Renford Dills ordered placed for SLP eval.  ?

## 2022-02-27 NOTE — Progress Notes (Signed)
?PROGRESS NOTE ? ?Cindy Brooks  [id number] DOB: 1971/09/09 DOA: 01/20/2022 ?PCP: Pcp, No  ? ?Brief Narrative: ? ?Patient is a 51 year old female with history of uncontrolled hypertension, history of TIA who presented to the [facility] emergency department on 4/9 after being found unresponsive by his family.  Patient was found to be obtunded with copious secretions, subsequently intubated on 4/9 for airway protection.  Hospital course remarkable for uncontrolled hypertension, work-up also showed thalamic stroke.  Wernicke's encephalopathy also suspected.  Now to [facility] service.  Now pending placement.  PCCM following with plan for decannulation later this week. ? ?Important events: ? ?4/9 Admit  ?4/12 ongoing high dose thiamine for possible Wernike's encephalopathy  ?4/13 IV sedation stopped, phenobarbital wean ?4/17 still not waking up. Trach placed.  ?4/18 PEG placed. Spiking fever. Lactate spiked. Got fluid. Cleared.  ?4/22 Tolerated 6hrs of ATC day prior. No major events overnight, appears that stroke had [place] meeting with family 4/21 with no decision made ?4/23 No issues overnight, on vent this am, remains unresponsive  ?4/27 changed to propranolol from metoprolol for neuro-storming. Started on vanc and cefepime for possible HCAP  ?4/28 trial on open ended trach collar ?5/2 patient transferred to [facility].  ?5/14 waking up and following simple commands. ?5/15 downsized tracheostomy to #4 ?5/16 tracheostomy capping trials. ? ? ?Assessment & Plan: ? ?Principal Problem: ?  Thalamic stroke (Cindy Brooks) ?Active Problems: ?  Acute encephalopathy ?  Respiratory failure (Cindy Brooks) ?  Tracheostomy dependence (Cindy Brooks) ?  Hypertension ?  PEG (percutaneous endoscopic gastrostomy) status ([place]) ?  Pressure injury of skin ?  Antiphospholipid antibody with hypercoagulable state (Cindy Brooks) ?  Hypernatremia ?  Coma (Cindy Brooks) ?  Fever ?  Leukocytosis ?  Sympathetic storming ?  HCAP (healthcare-associated pneumonia) ? ?Acute metabolic  encephalopathy: Unresponsive on presentation.  Multifactorial: Thalamic stroke, Warnicke's encephalopathy.  Monitor mental status. ?MRI brain on 4/11 showed patchy restricted diffusion on both thalami and midbrain.CT angiogram head and neck 4/11, severe bilateral P1 and right P2 PCA stenosis, bilateral ophthalmic infarct, severe left proximal M2 MCA branch stenosis, severe right A2 ACA branch stenosis, no evidence of dural venous thrombosis. ?Warnicke's encephalopathy was suspected and he was given high-dose thiamine.  Continue oral thiamine.  Patient was also on phenobarbital which was weaned off. ?Status post trach, PEG placement.  Remains on amantadine, gabapentin. ? ?Acute hypoxic respiratory failure: Intubated on presentation for airway protection.  Prolonged ventilator use.  Currently status post tracheostomy.  Currently on trach collar.  Hospital course also remarkable for healthcare associated pneumonia, treated with antibiotics, completed the course.  Repeat chest x-ray showed chronic atelectasis.  No evidence of new findings.  PCCM following and planning for decannulation later this week. ? ?Thalamic stroke: MRI as above.  Positive anticardiolipin IgM, lupus anticoagulant: Neurology was following and recommended Plavix. ? ?Dysphagia: Status post PEG.  Continue tube feeding ? ?Hypertension: Currently blood pressure stable.  On amlodipine, propanolol, hydralazine ? ?History of hyperlipidemia: On Crestor ? ?Obesity: BMI of 30.3 ? ? ? ?  ? ? ?Nutrition Problem: Inadequate oral intake ?Etiology: inability to eat ?Pressure Injury 01/31/22 [place] Bilateral Stage 2 -  Partial thickness loss of dermis presenting as a shallow open injury with a red, pink wound bed without slough. purewick device related skin tear, bilat lower labia (Active)  ?01/31/22 2200  ?Location: [facility]  ?Location Orientation: Bilateral  ?Staging: Stage 2 -  Partial thickness loss of dermis presenting as a shallow open injury with a red, pink  wound  bed without slough.  ?Wound Description (Comments): purewick device related skin tear, bilat lower labia  ?Present on Admission:   ?Dressing Type None 02/26/22 2001  ? ? ?DVT prophylaxis:SCDs Start: 01/20/22 1012 ? ? ?  Code Status: Full Code ? ?Family Communication: None at bedside ? ?Patient status:Inpatient ? ?Patient is from :Home ? ?Anticipated discharge to:[facility] ? ?Estimated DC date:Not sure ? ? ?Consultants: PCCM ? ?Procedures: Intubation, extubation, trach, PEG ? ?Antimicrobials:  ?Anti-infectives (From admission, onward)  ? ? Start     Dose/Rate Route Frequency Ordered Stop  ? 02/08/22 1800  vancomycin (VANCOREADY) IVPB 1500 mg/300 mL  Status:  Discontinued       ? 1,500 mg ?150 mL/hr over 120 Minutes Intravenous Every 24 hours 02/07/22 1712 02/09/22 1046  ? 02/07/22 1800  vancomycin (VANCOREADY) IVPB 1750 mg/350 mL       ? 1,750 mg ?175 mL/hr over 120 Minutes Intravenous  Once 02/07/22 1708 02/07/22 2046  ? 02/07/22 1700  ceFEPIme (MAXIPIME) 2 g in sodium chloride 0.9 % 100 mL IVPB       ? 2 g ?200 mL/hr over 30 Minutes Intravenous Every 8 hours 02/07/22 1706 02/14/22 0856  ? 01/20/22 2000  cefTRIAXone (ROCEPHIN) 2 g in sodium chloride 0.9 % 100 mL IVPB       ? 2 g ?200 mL/hr over 30 Minutes Intravenous Every 24 hours 01/20/22 0634 01/24/22 2022  ? 01/20/22 0415  cefTRIAXone (ROCEPHIN) 2 g in sodium chloride 0.9 % 100 mL IVPB       ? 2 g ?200 mL/hr over 30 Minutes Intravenous  Once 01/20/22 0414 01/20/22 0537  ? ?  ? ? ?Subjective: ?Patient seen and examined at the bedside this morning.  Comfortable.  On trach collar on room air.  Noted no signs of distress. ? ?Objective: ?Vitals:  ? 02/27/22 0321 02/27/22 0500 02/27/22 0502 02/27/22 0726  ?BP: 130/83  (!) 131/94 136/80  ?Pulse: 82   87  ?Resp: 20   19  ?Temp: 98.6 ?F (37 ?C)   98.8 ?F (37.1 ?C)  ?TempSrc: Axillary   Oral  ?SpO2: 90%   92%  ?Weight:  80.3 kg    ?Height:      ? ? ?Intake/Output Summary (Last 24 hours) at 02/27/2022 0853 ?Last data filed  at 02/26/2022 2312 ?Gross per 24 hour  ?Intake --  ?Output 1550 ml  ?Net -1550 ml  ? ?Filed Weights  ? 02/25/22 0500 02/26/22 0509 02/27/22 0500  ?Weight: 82.6 kg 80.4 kg 80.3 kg  ? ? ?Examination: ? ?General exam: Overall comfortable, not in distress, deconditioned, lying in bed ?HEENT: Left eye closed, trach collar ?Respiratory system:  no wheezes or crackles  ?Cardiovascular system: S1 & S2 heard, RRR.  ?Gastrointestinal system: Abdomen is nondistended, soft and nontender.  PEG ?Central nervous system: Aphasia, generalized weakness ?Extremities: No edema, no clubbing ,no cyanosis ?Skin: No rashes, no ulcers,no icterus   ? ? ?Data Reviewed: I have personally reviewed following labs and imaging studies ? ?CBC: ?Recent Labs  ?Lab 02/20/22 ?[id number] 02/25/22 ?[id number]  ?WBC 9.4 7.2  ?NEUTROABS 6.6 4.7  ?HGB 11.1* 11.2*  ?HCT 32.9* 34.0*  ?MCV 80.4 81.5  ?PLT 294 259  ? ?Basic Metabolic Panel: ?Recent Labs  ?Lab 02/25/22 ?0212  ?NA 137  ?K 4.2  ?CL 99  ?CO2 28  ?GLUCOSE 113*  ?BUN 14  ?CREATININE 0.54  ?CALCIUM 9.4  ?MG 2.1  ?PHOS 4.0  ? ? ? ?Recent Results (from the past 240  hour(s))  ?Urine Culture     Status: None  ? Collection Time: 02/18/22  8:35 AM  ? Specimen: Urine, Clean Catch  ?Result Value Ref Range Status  ? Specimen Description URINE, CLEAN CATCH  Final  ? Special Requests NONE  Final  ? Culture   Final  ?  NO GROWTH ?Performed at [facility], [place] [address], [place], [place] [address] ?  ? Report Status 02/19/2022 FINAL  Final  ?Culture, blood (Routine X 2) w Reflex to ID Panel     Status: None  ? Collection Time: 02/20/22 11:03 AM  ? Specimen: BLOOD LEFT WRIST  ?Result Value Ref Range Status  ? Specimen Description BLOOD LEFT WRIST  Final  ? Special Requests   Final  ?  BOTTLES DRAWN AEROBIC AND ANAEROBIC Blood Culture results may not be optimal due to an inadequate volume of blood received in culture bottles  ? Culture   Final  ?  NO GROWTH 5 DAYS ?Performed at [facility], [place] [address],  [place], [place] [address] ?  ? Report Status 02/25/2022 FINAL  Final  ?Culture, blood (Routine X 2) w Reflex to ID Panel     Status: Abnormal  ? Collection Time: 02/20/22 11:04 AM  ? Specimen: BLOOD LEFT HAND  ?Result V

## 2022-02-27 NOTE — Evaluation (Signed)
Speech Language Pathology Evaluation ?Patient Details ?Name: Cindy Brooks ?MRN: [id number] ?DOB: Aug 27, 1971 ?Today's Date: 02/27/2022 ?Time: 1046-1100 ?SLP Time Calculation (min) (ACUTE ONLY): 14 min ? ?Problem List:  ?Patient Active Problem List  ? Diagnosis Date Noted  ? HCAP (healthcare-associated pneumonia) 02/08/2022  ? Hypernatremia 02/07/2022  ? Coma ([place]) 02/07/2022  ? Fever 02/07/2022  ? Leukocytosis 02/07/2022  ? Sympathetic storming 02/07/2022  ? Antiphospholipid antibody with hypercoagulable state ([place]) 02/04/2022  ? Pressure injury of skin 02/01/2022  ? PEG (percutaneous endoscopic gastrostomy) status ([name]) 01/29/2022  ? Respiratory failure ([place]) 01/28/2022  ? Tracheostomy dependence ([place]) 01/28/2022  ? Hypertension   ? Thalamic stroke ([place]) 01/22/2022  ? Acute encephalopathy 01/20/2022  ? ?Past Medical History:  ?Past Medical History:  ?Diagnosis Date  ? Hypertension   ? Hypertensive urgency 01/20/2022  ? Seizure ([place]) 01/28/2022  ? TIA (transient ischemic attack) 2019  ? Urinary tract infection 01/28/2022  ? ?Past Surgical History:  ?Past Surgical History:  ?Procedure Laterality Date  ? ESOPHAGOGASTRODUODENOSCOPY N/A 01/28/2022  ? Procedure: ESOPHAGOGASTRODUODENOSCOPY (EGD);  Surgeon: Jesusita Oka, MD;  Location: [facility] ENDOSCOPY;  Service: General;  Laterality: N/A;  ? PEG PLACEMENT N/A 01/28/2022  ? Procedure: PERCUTANEOUS ENDOSCOPIC GASTROSTOMY (PEG) PLACEMENT;  Surgeon: Jesusita Oka, MD;  Location: [facility] ENDOSCOPY;  Service: General;  Laterality: N/A;  ? ?HPI:  ?Pt is a 51 y/o female who presented to the ED after being found unresponsive in her care. Pt admitted 4/9 with thalamic stroke, treated for possible acute metabolic and wernicke's encphalopathy as well. She required intubation for airway protection on admission and had a tracheostomy placed on 4/16.  PEG on 4/18. MRI 4/19: Multiple foci of restricted diffusion in the bilateral frontal lobes and parietal lobes primarily. Started tolerating ATC on  4/22. Has suffered from severe encephalopathy, neurostorming. Started following commands around 5/15. A time of evaluation, pt with a 4 cuffless trach which is capped and may be decannulated within the next 24-48 hours. PMH: HTN, TIA, ETOH abuse and medication noncompliance.  ? ?Assessment / Plan / Recommendation ?Clinical Impression ? Pt was seen for speech-language evaluation. She presents with impairments in receptive and expressive language. Receptively, she achieved relative accuracy with simple yes/no questions and completion of 1-step commands. She was unable to follow any 2-step commands despite repetition and prompts and her accuracy with auditory comprehension of yes/no questions declined as complexity increased. Pt verbally responded to yes/no questions with additional processing time, but exhibited a preference for use of gestures. She demonstrated difficulty with confrontational naming, but was able to name some common objects. Pt's overall processing speed was reduced and difficulty with attention was noted; repetition was often necessary to facilitate communication. Skilled SLP services are clinically indicated at this time. ?   ?SLP Assessment ? SLP Recommendation/Assessment: Patient needs continued [facility] ?SLP Visit Diagnosis: Aphasia (R47.01);Cognitive communication deficit (R41.841)  ?  ?Recommendations for follow up therapy are one component of a multi-disciplinary discharge planning process, led by the attending physician.  Recommendations may be updated based on patient status, additional functional criteria and insurance authorization. ?   ?Follow Up Recommendations ? Skilled nursing-short term rehab (<3 hours/day)  ?  ?Assistance Recommended at Discharge ? Frequent or constant Supervision/Assistance  ?Functional Status Assessment Patient has had a recent decline in their functional status and demonstrates the ability to make significant improvements in function in  a reasonable and predictable amount of time.  ?Frequency and Duration min 2x/week  ?2 weeks ?  ?   ?  SLP Evaluation ?Cognition ? Overall Cognitive Status: Difficult to assess (due to language impairments) ?Arousal/Alertness: Awake/alert ?Orientation Level: Oriented to person ?Attention: Focused ?Focused Attention: Impaired ?Focused Attention Impairment: Verbal basic  ?  ?   ?Comprehension ? Auditory Comprehension ?Overall Auditory Comprehension: Impaired ?Yes/No Questions: Impaired ?Basic Biographical Questions:  (3/5) ?Complex Questions:  (0/4) ?Commands: Impaired ?One Step Basic Commands:  (4/5) ?Two Step Basic Commands:  (0/4)  ?  ?Expression Expression ?Primary Mode of Expression: Verbal ?Verbal Expression ?Overall Verbal Expression: Impaired ?Initiation: Impaired ?Automatic Speech: Counting (0/10) ?Level of Generative/Spontaneous Verbalization: Word ?Repetition: Impaired ?Level of Impairment: Word level ?Naming: Impairment ?Confrontation:  (1/4)   ?Oral / Motor ? Oral Motor/Sensory Function ?Overall Oral Motor/Sensory Function: Within functional limits (though limited) ?Motor Speech ?Phonation: Low vocal intensity ?Articulation:  (difficult to assess due to paucity of verbal output)   ?        ?Roberta Kelly I. Hardin Negus, [place], CCC-SLP ?Acute Rehabilitation Services ?Office number [phone] ?Pager [phone] ? ?Horton Marshall ?02/27/2022, 11:32 AM ? ? ? ?

## 2022-02-27 NOTE — Evaluation (Signed)
Clinical/Bedside Swallow Evaluation ?Patient Details  ?Name: Cindy Brooks ?MRN: [id number] ?Date of Birth: [birth year] ? ?Today's Date: 02/27/2022 ?Time: SLP Start Time (ACUTE ONLY): [id number] SLP Stop Time (ACUTE ONLY): 1045 ?SLP Time Calculation (min) (ACUTE ONLY): 13 min ? ?Past Medical History:  ?Past Medical History:  ?Diagnosis Date  ? Hypertension   ? Hypertensive urgency 01/20/2022  ? Seizure ([place]) 01/28/2022  ? TIA (transient ischemic attack) 2019  ? Urinary tract infection 01/28/2022  ? ?Past Surgical History:  ?Past Surgical History:  ?Procedure Laterality Date  ? ESOPHAGOGASTRODUODENOSCOPY N/A 01/28/2022  ? Procedure: ESOPHAGOGASTRODUODENOSCOPY (EGD);  Surgeon: Jesusita Oka, MD;  Location: [facility] ENDOSCOPY;  Service: General;  Laterality: N/A;  ? PEG PLACEMENT N/A 01/28/2022  ? Procedure: PERCUTANEOUS ENDOSCOPIC GASTROSTOMY (PEG) PLACEMENT;  Surgeon: Jesusita Oka, MD;  Location: [facility] ENDOSCOPY;  Service: General;  Laterality: N/A;  ? ?HPI:  ?Pt is a 51 y/o female who presented to the ED after being found unresponsive in her care. Pt admitted 4/9 with thalamic stroke, treated for possible acute metabolic and wernicke's encphalopathy as well. She required intubation for airway protection on admission and had a tracheostomy placed on 4/16.  PEG on 4/18. MRI 4/19: Multiple foci of restricted diffusion in the bilateral frontal lobes and parietal lobes primarily. Started tolerating ATC on 4/22. Has suffered from severe encephalopathy, neurostorming. Started following commands around 5/15. A time of evaluation, pt with a 4 cuffless trach which is capped and may be decannulated within the next 24-48 hours. PMH: HTN, TIA, ETOH abuse and medication noncompliance.  ?  ?Assessment / Plan / Recommendation  ?Clinical Impression ? Pt was seen for bedside swallow evaluation. She was alert and cooperative. Oral mechanism exam was limited due to pt's difficulty following commands; however, lingual ROM and strength appeared reduced.  She presented with adequate, natural dentition. She demonstrated reduced bolus awareness with oral holding across consistencies and absent mastication. A liquid wash was necessary for oral clearance of regular texture solids which were sucked and not masticated. Mild lingual residue was cleared with a liquid wash. No s/sx of aspiration were noted with any solids or liquids. It is recommended that pt's NPO status be maintained, with allowance of ice chips after oral care, until a modified barium swallow study is conducted. This will be planned for within the next 24-48 hours pending pt's performance and plans for decannulation. ?SLP Visit Diagnosis: Dysphagia, unspecified (R13.10) ?   ?Aspiration Risk ? Mild aspiration risk  ?  ?Diet Recommendation NPO;Ice chips PRN after oral care  ? ?Medication Administration: Via alternative means ?Postural Changes: Seated upright at 90 degrees  ?  ?Other  Recommendations Oral Care Recommendations: Oral care BID   ? ?Recommendations for follow up therapy are one component of a multi-disciplinary discharge planning process, led by the attending physician.  Recommendations may be updated based on patient status, additional functional criteria and insurance authorization. ? ?Follow up Recommendations Skilled nursing-short term rehab (<3 hours/day)  ? ? ?  ?Assistance Recommended at Discharge    ?Functional Status Assessment Patient has had a recent decline in their functional status and demonstrates the ability to make significant improvements in function in a reasonable and predictable amount of time.  ?Frequency and Duration min 2x/week  ?2 weeks ?  ?   ? ?Prognosis Prognosis for Safe Diet Advancement: Good ?Barriers to Reach Goals: Cognitive deficits;Language deficits;Time post onset  ? ?  ? ?Swallow Study   ?General Date of Onset: 01/20/22 ?HPI: Pt is a 51  y/o female who presented to the ED after being found unresponsive in her care. Pt admitted 4/9 with thalamic stroke, treated  for possible acute metabolic and wernicke's encphalopathy as well. She required intubation for airway protection on admission and had a tracheostomy placed on 4/16.  PEG on 4/18. MRI 4/19: Multiple foci of restricted diffusion in the bilateral frontal lobes and parietal lobes primarily. Started tolerating ATC on 4/22. Has suffered from severe encephalopathy, neurostorming. Started following commands around 5/15. A time of evaluation, pt with a 4 cuffless trach which is capped and may be decannulated within the next 24-48 hours. PMH: HTN, TIA, ETOH abuse and medication noncompliance. ?Type of Study: Bedside Swallow Evaluation ?Previous Swallow Assessment: none ?Diet Prior to this Study: NPO;PEG tube ?Temperature Spikes Noted: No ?Respiratory Status: Trach Collar;Trach ?Trach Size and Type: #4 (capped) ?History of Recent Intubation: No ?Behavior/Cognition: Alert;Pleasant mood;Cooperative;Requires cueing ?Oral Cavity Assessment: Within Functional Limits ?Oral Care Completed by SLP: No ?Oral Cavity - Dentition: Adequate natural dentition ?Self-Feeding Abilities: Total assist ?Patient Positioning: Upright in bed;Postural control adequate for testing ?Baseline Vocal Quality: Low vocal intensity ?Volitional Cough: Cognitively unable to elicit ?Volitional Swallow: Able to elicit  ?  ?Oral/Motor/Sensory Function Overall Oral Motor/Sensory Function: Within functional limits (though limited)   ?Ice Chips Ice chips: Within functional limits ?Presentation: Spoon   ?Thin Liquid Thin Liquid: Impaired ?Presentation: Straw ?Oral Phase Impairments: Poor awareness of bolus ?Oral Phase Functional Implications: Oral holding  ?  ?Nectar Thick Nectar Thick Liquid: Not tested   ?Honey Thick Honey Thick Liquid: Not tested   ?Puree Puree: Impaired ?Presentation: Spoon ?Oral Phase Impairments: Poor awareness of bolus ?Oral Phase Functional Implications: Oral holding   ?Solid ? ? ?  Solid: Impaired ?Oral Phase Impairments: Poor awareness of  bolus;Impaired mastication ?Oral Phase Functional Implications: Oral residue;Oral holding (absent mastication)  ? ?  ?Anaisa Radi I. Hardin Negus, [place], CCC-SLP ?Acute Rehabilitation Services ?Office number [phone] ?Pager [phone] ? ?Horton Marshall ?02/27/2022,11:15 AM ? ? ? ? ? ?

## 2022-02-28 ENCOUNTER — Inpatient Hospital Stay (HOSPITAL_COMMUNITY): Payer: No Typology Code available for payment source

## 2022-02-28 LAB — GLUCOSE, CAPILLARY
Glucose-Capillary: 140 mg/dL — ABNORMAL HIGH (ref 70–99)
Glucose-Capillary: 127 mg/dL — ABNORMAL HIGH (ref 70–99)
Glucose-Capillary: 120 mg/dL — ABNORMAL HIGH (ref 70–99)
Glucose-Capillary: 145 mg/dL — ABNORMAL HIGH (ref 70–99)
Glucose-Capillary: 110 mg/dL — ABNORMAL HIGH (ref 70–99)
Glucose-Capillary: 137 mg/dL — ABNORMAL HIGH (ref 70–99)

## 2022-02-28 NOTE — TOC Progression Note (Addendum)
Transition of Care [facility]) - Progression Note    Patient Details  Name: Cindy Brooks MRN: [id number] Date of Birth: 1971-04-10  Transition of Care [facility]) CM/SW Contact  Eduard Roux, [place] Phone Number: 02/28/2022, 10:37 AM  Clinical Narrative:     TOC continues to follow- referral has been sent for DTP.  Expected Discharge Plan: Skilled Nursing Facility Barriers to Discharge: Inadequate or no insurance, No SNF bed (Trach less than 2 days old)  Expected Discharge Plan and Services Expected Discharge Plan: Skilled Nursing Facility In-house Referral: Clinical Social Work   Post Acute Care Choice: Skilled Nursing Facility Living arrangements for the past 2 months: Single Family Home                                       Social Determinants of Health (SDOH) Interventions    Readmission Risk Interventions     View : No data to display.

## 2022-02-28 NOTE — Progress Notes (Signed)
Pt returned to unit. VS and assessment documented.

## 2022-02-28 NOTE — Progress Notes (Signed)
Modified Barium Swallow Progress Note  Patient Details  Name: Cindy Brooks MRN: [id number] Date of Birth: [birth year]  Today's Date: 02/28/2022  Modified Barium Swallow completed.  Full report located under Chart Review in the Imaging Section.  Brief recommendations include the following:  Clinical Impression  Pt was lethargic throughout the study despite sternal rub and thermotactile stimulation with cold washcloth, and pt exhibited difficulty maintaining an adequate level of alertness once improved alertness was achieved. SLP suspects that this, combined with the flavor the barium, likely impacted pt's performance during the study. However, the study was somewhat limited due to pt's lethargy. She exhibited worse bolus awareness than has been exhibited at bedside, and significant oral holding was noted with purees and thin liquids. Oral suctioning was necessary once due to the extent of oral holding. Minimal premature spillage to the valleculae was noted with a thin liquid bolus via straw; however, pt was otherwise able to contain large liquid boluses in the oral cavity with no instances of premature spillage even when a posterior head tilt was facilitated. With verbal and tactile prompts and cues, once the swallow was triggered, pharyngeal clearance was WNL and no instances of penetration/aspiration were demonstrated. Considering pt's current lethargy, diet initiation will be deferred today. However, once pt's presentation is more similar to that noted this morning in treatment, a dysphagia 1 diet with thin liquids will likely be initiated.   Swallow Evaluation Recommendations       SLP Diet Recommendations: Dysphagia 1 (Puree) solids;Thin liquid (diet will be ordered on subsequent date if pt's alertness is improved)   Liquid Administration via: Cup;Straw   Medication Administration: Via alternative means (or crushed with puree)   Supervision: Staff to assist with self feeding;Full  supervision/cueing for compensatory strategies   Compensations: Slow rate;Small sips/bites;Minimize environmental distractions   Postural Changes: Seated upright at 90 degrees   Oral Care Recommendations: Oral care BID      Angelli Baruch I. Vear Clock, MS, CCC-SLP Acute Rehabilitation Services Office number [phone] Pager [phone]  Scheryl Marten 02/28/2022,1:41 PM

## 2022-02-28 NOTE — Progress Notes (Signed)
Physical Therapy Treatment Patient Details Name: Cindy Brooks MRN: [id number] DOB: [birth year] Today's Date: 02/28/2022   History of Present Illness Pt is a 51 y.o. female with history of HTN, TIA, ETOH abuse and medication noncompliance presenting to the ED after she was found by her family to be unresponsive in her car on 4/8. 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 Intubated 4/9. Tracheostormy 4/16.  Trach capped 5/16.    PT Comments    Patient improving with strength in all 4 extremities, with following commands, with sitting balance and today attempting sit to stand x 5 attempts with pt pushing weight into LE's.  She tolerated treatment will and seemed eager to participate despite some grimaces of pain with movement of R knee and cervical AAROM.   With level of participation and progression would anticipate she may benefit from acute inpatient rehab to facilitate d/c home with family support.  PT goals updated this session and frequency upgraded based on progress as well.   Recommendations for follow up therapy are one component of a multi-disciplinary discharge planning process, led by the attending physician.  Recommendations may be updated based on patient status, additional functional criteria and insurance authorization.  Follow Up Recommendations  Acute inpatient rehab (3hours/day)     Assistance Recommended at Discharge Frequent or constant Supervision/Assistance  Patient can return home with the following Two people to help with walking and/or transfers;Two people to help with bathing/dressing/bathroom   Equipment Recommendations  Other (comment) (TBA)    Recommendations for Other Services Rehab consult     Precautions / Restrictions Precautions Precautions: Fall Precaution Comments: trach (capped)/PEG      Mobility  Bed Mobility Overal bed mobility: Needs Assistance Bed Mobility: Rolling, Supine to Sit, Sit to Supine Rolling: Mod assist, +2 for physical assistance   Supine to sit: Mod assist, +2 for physical assistance, HOB elevated Sit to supine: Max assist, +2 for safety/equipment   General bed mobility comments: rolling once supine for hygiene and linen change assist to place L hand on rail; to sitting assist to initiate moving legs and to lift trunk; to supine assist for legs and trunk and to scoot up in bed    Transfers Overall transfer level: Needs assistance   Transfers: Sit to/from Stand Sit to Stand: Max assist, +2 physical assistance           General transfer comment: initiating coming up on her feet with +2 A; unable to extend trunk or legs, but supporting some weight in her feet and legs; completed x 5 reps for LE strength    Ambulation/Gait                   Stairs             Wheelchair Mobility    Modified Rankin (Stroke Patients Only) Modified Rankin (Stroke Patients Only) Pre-Morbid Rankin Score: No symptoms Modified Rankin: Severe disability     Balance Overall balance assessment: Needs assistance Sitting-balance support: Feet supported Sitting balance-Leahy Scale: Poor Sitting balance - Comments: can sit for intermittent periods wtih S, work in sitting to hold her head up several times with 10 sec hold; also work on trunk extension in sitting, cervical rotation and using UE's to recover from lateral leans  Cognition Arousal/Alertness: Awake/alert Behavior During Therapy: Flat affect Overall Cognitive Status: Difficult to assess                         Following Commands: Follows one step commands consistently, Follows one step commands with increased time     Problem Solving: Slow processing, Decreased initiation General Comments: following commands and responding  with increased time needed        Exercises      General Comments General comments (skin integrity, edema, etc.): trach capped, pt managing secretions, to go for MBSS today      Pertinent Vitals/Pain Pain Assessment Pain Assessment: Faces Faces Pain Scale: Hurts little more Pain Location: R knee and neck with AAROM Pain Descriptors / Indicators: Grimacing Pain Intervention(s): Limited activity within patient's tolerance, Monitored during session    Home Living                          Prior Function            PT Goals (current goals can now be found in the care plan section) Acute Rehab PT Goals Patient Stated Goal: unable to state PT Goal Formulation: Patient unable to participate in goal setting Time For Goal Achievement: 03/14/22 Potential to Achieve Goals: Fair Progress towards PT goals: Goals met and updated - see care plan    Frequency    Min 4X/week      PT Plan Frequency needs to be updated;Discharge plan needs to be updated    Co-evaluation              AM-PAC PT "6 Clicks" Mobility   Outcome Measure  Help needed turning from your back to your side while in a flat bed without using bedrails?: A Lot Help needed moving from lying on your back to sitting on the side of a flat bed without using bedrails?: Total Help needed moving to and from a bed to a chair (including a wheelchair)?: Total Help needed standing up from a chair using your arms (e.g., wheelchair or bedside chair)?: Total Help needed to walk in hospital room?: Total Help needed climbing 3-5 steps with a railing? : Total 6 Click Score: 7    End of Session Equipment Utilized During Treatment: Gait belt Activity Tolerance: Patient tolerated treatment well Patient left: in bed;with call bell/phone within reach;with family/visitor present Nurse Communication: Mobility status PT Visit Diagnosis: Other abnormalities of gait and mobility (R26.89);Other symptoms and signs  involving the nervous system (R29.898)     Time: 5797-2820 PT Time Calculation (min) (ACUTE ONLY): 34 min  Charges:  $Therapeutic Activity: 23-37 mins                     Magda Kiel, PT Acute Rehabilitation Services UORVI:[phone] Office:[phone] 02/28/2022    Reginia Naas 02/28/2022, 1:36 PM

## 2022-02-28 NOTE — Progress Notes (Signed)
Speech Language Pathology Treatment: Dysphagia;Cognitive-Linquistic  Patient Details Name: Cindy Brooks MRN: [id number] DOB: 06-Jan-1971 Today's Date: 02/28/2022 Time: [id number] SLP Time Calculation (min) (ACUTE ONLY): 24 min  Assessment / Plan / Recommendation Clinical Impression  Pt was seen for treatment with her son present. Pt was alert and cooperative with improved verbal output compared to yesterday. She intermittently produced short phrases during structured tasks. She appropriately responded to simple yes/no questions with additional processing time. She produced automatic sequences with intermittent phonemic cues. She achieved 40% accuracy with confrontational naming increasing to 80% with cues. She demonstrated 80% accuracy with sentence completion increasing to 100% with verbal prompts. Pt continues to tolerate puree and thin liquids without overt s/sx of aspiration. Bolus awareness was improved compared to yesterday; some oral holding was noted, but pt swallowed all boluses without prompts/cues. PCCM would like to defer decannulation pending outcome of MBS which is scheduled for today at 1230.   HPI HPI: Pt is a 51 y/o female who presented to the ED after being found unresponsive in her care. Pt admitted 4/9 with thalamic stroke, treated for possible acute metabolic and wernicke's encphalopathy as well. She required intubation for airway protection on admission and had a tracheostomy placed on 4/16.  PEG on 4/18. MRI 4/19: Multiple foci of restricted diffusion in the bilateral frontal lobes and parietal lobes primarily. Started tolerating ATC on 4/22. Has suffered from severe encephalopathy, neurostorming. Started following commands around 5/15. At time of evaluation, pt with a 4 cuffless trach which is capped and may be decannulated within the next 24-48 hours. PMH: HTN, TIA, ETOH abuse and medication noncompliance.      SLP Plan  Continue with current plan of care       Recommendations for follow up therapy are one component of a multi-disciplinary discharge planning process, led by the attending physician.  Recommendations may be updated based on patient status, additional functional criteria and insurance authorization.    Recommendations  Diet recommendations: NPO Medication Administration: Via alternative means                Oral Care Recommendations: Oral care BID Follow Up Recommendations: Skilled nursing-short term rehab (<3 hours/day) SLP Visit Diagnosis: Dysphagia, unspecified (R13.10) Plan: Continue with current plan of care         Delando Satter I. Hardin Negus, [place], [place] Office number [phone] Pager [place]  02/28/2022, 10:46 AM

## 2022-02-28 NOTE — Progress Notes (Signed)
Pt off unit for modified barium swallow.

## 2022-02-28 NOTE — Progress Notes (Signed)
   NAME:  Cindy Brooks, MRN:  [id number], DOB:  04-May-1971, LOS: 39 ADMISSION DATE:  01/20/2022, CONSULTATION DATE:  4/9 REFERRING MD:  Toniann Fail, CHIEF COMPLAINT:  Confusion   History of Present Illness:  51 y/o female admitted on 4/9 in the setting of a thalamic stroke, treated for possible Wernicke's encphalopathy as well.  She required intubation for airway protection on admission and had a tracheostomy placed on 4/16.  PCCM is following for tracheostomy management.  Pertinent  Medical History  Hypertension TIA  Significant Hospital Events: Including procedures, antibiotic start and stop dates in addition to other pertinent events   4/9 Admit  4/12 ongoing high dose thiamine for possible Wernike's encephalopathy  4/13 IV sedation stopped, phenobarbital wean 4/17 still not waking up. Trach placed.  4/18 PEG placed. Spiking fever. Lactate spiked. Got fluid. Cleared.  4/22 Tolerated 6hrs of ATC day prior. No major events overnight, appears that stroke had GOC meeting with family 4/21 with no decision made 4/23 No issues overnight, on vent this am, remains unresponsive  4/27 changed to propranolol from metoprolol for neuro-storming. Started on vanc and cefepime for possible HCAP  4/28 trial on open ended trach collar 4/30  trach exchange 6 cuffless 5/16 trach capped  Interim History / Subjective:  SLP yesterday> remains NPO, plans for MBS Tolerating capping trial thus far 48hrs.  No secretions/ suctioning.  Remains on room air.  Denies SOB  Objective   Blood pressure 139/84, pulse 88, temperature 99.2 F (37.3 C), temperature source Axillary, resp. rate 16, height 5\' 4"  (1.626 m), weight 80.3 kg, SpO2 92 %.    FiO2 (%):  [21 %] 21 %   Intake/Output Summary (Last 24 hours) at 02/28/2022 0857 Last data filed at 02/27/2022 2336 Gross per 24 hour  Intake --  Output 800 ml  Net -800 ml   Filed Weights   02/25/22 0500 02/26/22 0509 02/27/22 0500  Weight: 82.6 kg 80.4 kg 80.3 kg     Examination: General:  Adult female resting in bed in NAD, son at bedside HEENT: MM pink/moist, midline 4 cuffless trach-capped, site wnl Neuro: Awake, soft phonation> states name and follow simple commands in extremities, mostly 2-3/5 CV: rr,  no murmur PULM:  non labored, CTA GI: soft, bs+, gtube, purwick Extremities: warm/dry, generalized mild edema  Skin: no rashes   Assessment & Plan:  Thalamic stroke Acute metabolic encphalopathy Wernicke's encephalopathy 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  Tolerated now x 48hrs.  No issues with secretions.  Remains on room air.  More responsive.   - pending MBS hopefully today.  If tolerates procedure, likely can proceed with decannulation.   - SLP following       03/01/22, ACNP [facility] 02/28/2022, 9:01 AM  See Amion for pager If no response to pager, please call PCCM consult pager After 7:00 pm call Elink

## 2022-02-28 NOTE — Progress Notes (Signed)
PROGRESS NOTE  Cindy Brooks  GQB:[id number] DOB: [birth year] DOA: 01/20/2022 PCP: Pcp, No   Brief Narrative:  Patient is a 51 year old female with history of uncontrolled hypertension, history of TIA who presented to the [facility] emergency department on 4/9 after being found unresponsive by his family.  Patient was found to be obtunded with copious secretions, subsequently intubated on 4/9 for airway protection.  Hospital course remarkable for uncontrolled hypertension, work-up also showed thalamic stroke.  Wernicke's encephalopathy also suspected.  Transferred to [facility] service.    PCCM following with plan for decannulation later this week.  Pending placement.  Important events:  4/9 Admit  4/12 ongoing high dose thiamine for possible Wernike's encephalopathy  4/13 IV sedation stopped, phenobarbital wean 4/17 still not waking up. Trach placed.  4/18 PEG placed. Spiking fever. Lactate spiked. Got fluid. Cleared.  4/22 Tolerated 6hrs of ATC day prior. No major events overnight, appears that stroke had [place] meeting with family 4/21 with no decision made 4/23 No issues overnight, on vent this am, remains unresponsive  4/27 changed to propranolol from metoprolol for neuro-storming. Started on vanc and cefepime for possible HCAP  4/28 trial on open ended trach collar 5/2 patient transferred to Hospitalist Service.  5/14 waking up and following simple commands. 5/15 downsized tracheostomy to #4 5/16 tracheostomy capping trials.   Assessment & Plan:  Principal Problem:   Thalamic stroke ([place]) Active Problems:   Acute encephalopathy   Respiratory failure (HCC)   Tracheostomy dependence (HCC)   Hypertension   PEG (percutaneous endoscopic gastrostomy) status (HCC)   Pressure injury of skin   Antiphospholipid antibody with hypercoagulable state ([place])   Hypernatremia   Coma ([place])   Fever   Leukocytosis   Sympathetic storming   HCAP (healthcare-associated pneumonia)  Acute metabolic  encephalopathy: Unresponsive on presentation.  Multifactorial: Thalamic stroke, Warnicke's encephalopathy.  Monitor mental status. MRI brain on 4/11 showed patchy restricted diffusion on both thalami and midbrain.CT angiogram head and neck 4/11, severe bilateral P1 and right P2 PCA stenosis, bilateral ophthalmic infarct, severe left proximal M2 MCA branch stenosis, severe right A2 ACA branch stenosis, no evidence of dural venous thrombosis. Warnicke's encephalopathy was suspected and he was given high-dose thiamine.  Continue oral thiamine.  Patient was also on phenobarbital which was weaned off. Status post trach, PEG placement.  Remains on amantadine, gabapentin.  Acute hypoxic respiratory failure: Intubated on presentation for airway protection.  Prolonged ventilator use.  Currently status post tracheostomy.  Currently on trach collar.  Hospital course also remarkable for healthcare associated pneumonia, treated with antibiotics, completed the course.  Repeat chest x-ray showed chronic atelectasis.  No evidence of new findings.  PCCM following and planning for decannulation later this week.  Thalamic stroke: MRI as above.  Positive anticardiolipin IgM, lupus anticoagulant: Neurology was following and recommended Plavix.  Dysphagia: Status post PEG.  Continue tube feeding.  Speech therapy closely following, recommending n.p.o.  Hypertension: Currently blood pressure stable.  On amlodipine, propanolol, hydralazine  History of hyperlipidemia: On Crestor  Obesity: BMI of 30.3        Nutrition Problem: Inadequate oral intake Etiology: inability to eat Pressure Injury 01/31/22 [place] Bilateral Stage 2 -  Partial thickness loss of dermis presenting as a shallow open injury with a red, pink wound bed without slough. purewick device related skin tear, bilat lower labia (Active)  01/31/22 2200  Location: [place]  Location Orientation: Bilateral  Staging: Stage 2 -  Partial thickness loss of dermis  presenting as a  shallow open injury with a red, pink wound bed without slough.  Wound Description (Comments): purewick device related skin tear, bilat lower labia  Present on Admission:   Dressing Type None 02/28/22 0802    DVT prophylaxis:SCDs Start: 01/20/22 1012     Code Status: Full Code  Family Communication: Family member at bedside  Patient status:Inpatient  Patient is from :Home  Anticipated discharge to:[facility]  Estimated DC date:Not sure   Consultants: PCCM  Procedures: Intubation, extubation, trach, PEG  Antimicrobials:  Anti-infectives (From admission, onward)    Start     Dose/Rate Route Frequency Ordered Stop   02/08/22 1800  vancomycin (VANCOREADY) IVPB 1500 mg/300 mL  Status:  Discontinued        1,500 mg 150 mL/hr over 120 Minutes Intravenous Every 24 hours 02/07/22 1712 02/09/22 1046   02/07/22 1800  vancomycin (VANCOREADY) IVPB 1750 mg/350 mL        1,750 mg 175 mL/hr over 120 Minutes Intravenous  Once 02/07/22 1708 02/07/22 2046   02/07/22 1700  ceFEPIme (MAXIPIME) 2 g in sodium chloride 0.9 % 100 mL IVPB        2 g 200 mL/hr over 30 Minutes Intravenous Every 8 hours 02/07/22 1706 02/14/22 0856   01/20/22 2000  cefTRIAXone (ROCEPHIN) 2 g in sodium chloride 0.9 % 100 mL IVPB        2 g 200 mL/hr over 30 Minutes Intravenous Every 24 hours 01/20/22 0634 01/24/22 2022   01/20/22 0415  cefTRIAXone (ROCEPHIN) 2 g in sodium chloride 0.9 % 100 mL IVPB        2 g 200 mL/hr over 30 Minutes Intravenous  Once 01/20/22 0414 01/20/22 0537       Subjective: Patient seen and examined at the bedside this morning.  Same as yesterday.  No significant change.  She was cleaning herself with a towel.  Appears comfortable.  On trach collar/room air  Objective: Vitals:   02/28/22 0410 02/28/22 0418 02/28/22 0744 02/28/22 0853  BP: 127/83  139/84   Pulse: 81 86 88   Resp: 18 18 18 16   Temp: 99 F (37.2 C)  99.2 F (37.3 C)   TempSrc:   Axillary   SpO2: 100% 93%  92%   Weight:      Height:        Intake/Output Summary (Last 24 hours) at 02/28/2022 1116 Last data filed at 02/27/2022 2336 Gross per 24 hour  Intake --  Output 800 ml  Net -800 ml   Filed Weights   02/25/22 0500 02/26/22 0509 02/27/22 0500  Weight: 82.6 kg 80.4 kg 80.3 kg    Examination:   General exam: Overall comfortable, not in distress, very deconditioned, lying in bed HEENT: Left eye closed, trach collar Respiratory system:  no wheezes or crackles  Cardiovascular system: S1 & S2 heard, RRR.  Gastrointestinal system: Abdomen is nondistended, soft and nontender.PEG Central nervous system: aphagia,generalized weakness Extremities: No edema, no clubbing ,no cyanosis Skin: No rashes, no ulcers,no icterus     Data Reviewed: I have personally reviewed following labs and imaging studies  CBC: Recent Labs  Lab 02/25/22 0212  WBC 7.2  NEUTROABS 4.7  HGB 11.2*  HCT 34.0*  MCV 81.5  PLT 935   Basic Metabolic Panel: Recent Labs  Lab 02/25/22 0212  NA 137  K 4.2  CL 99  CO2 28  GLUCOSE 113*  BUN 14  CREATININE 0.54  CALCIUM 9.4  MG 2.1  PHOS 4.0  Recent Results (from the past 240 hour(s))  Culture, blood (Routine X 2) w Reflex to ID Panel     Status: None   Collection Time: 02/20/22 11:03 AM   Specimen: BLOOD LEFT WRIST  Result Value Ref Range Status   Specimen Description BLOOD LEFT WRIST  Final   Special Requests   Final    BOTTLES DRAWN AEROBIC AND ANAEROBIC Blood Culture results may not be optimal due to an inadequate volume of blood received in culture bottles   Culture   Final    NO GROWTH 5 DAYS Performed at [facility], [place] [address], [place], [place] [address]    Report Status 02/25/2022 FINAL  Final  Culture, blood (Routine X 2) w Reflex to ID Panel     Status: Abnormal   Collection Time: 02/20/22 11:04 AM   Specimen: BLOOD LEFT HAND  Result Value Ref Range Status   Specimen Description BLOOD LEFT HAND  Final   Special  Requests   Final    BOTTLES DRAWN AEROBIC AND ANAEROBIC Blood Culture results may not be optimal due to an inadequate volume of blood received in culture bottles   Culture  Setup Time   Final    GRAM POSITIVE COCCI IN BOTH AEROBIC AND ANAEROBIC BOTTLES Organism ID to follow CRITICAL RESULT CALLED TO, READ BACK BY AND VERIFIED WITH: J. FRENS PHARMD, AT 0932 02/21/22 D. VANHOOK    Culture (A)  Final    STAPHYLOCOCCUS EPIDERMIDIS THE SIGNIFICANCE OF ISOLATING THIS ORGANISM FROM A SINGLE SET OF BLOOD CULTURES WHEN MULTIPLE SETS ARE DRAWN IS UNCERTAIN. PLEASE NOTIFY THE MICROBIOLOGY DEPARTMENT WITHIN ONE WEEK IF SPECIATION AND SENSITIVITIES ARE REQUIRED. Performed at [facility], [place] [address] [place], [place] [address]    Report Status 02/23/2022 FINAL  Final  Blood Culture ID Panel (Reflexed)     Status: Abnormal   Collection Time: 02/20/22 11:04 AM  Result Value Ref Range Status   Enterococcus faecalis NOT DETECTED NOT DETECTED Final   Enterococcus Faecium NOT DETECTED NOT DETECTED Final   Listeria monocytogenes NOT DETECTED NOT DETECTED Final   Staphylococcus species DETECTED (A) NOT DETECTED Final    Comment: CRITICAL RESULT CALLED TO, READ BACK BY AND VERIFIED WITH: J. FRENS PHARMD, AT 5809 02/21/22 D. VANHOOK    Staphylococcus aureus (BCID) NOT DETECTED NOT DETECTED Final   Staphylococcus epidermidis DETECTED (A) NOT DETECTED Final    Comment: Methicillin (oxacillin) resistant coagulase negative staphylococcus. Possible blood culture contaminant (unless isolated from more than one blood culture draw or clinical case suggests pathogenicity). No antibiotic treatment is indicated for blood  culture contaminants. CRITICAL RESULT CALLED TO, READ BACK BY AND VERIFIED WITH: J. FRENS PHARMD, AT 9833 02/21/22 D. VANHOOK    Staphylococcus lugdunensis NOT DETECTED NOT DETECTED Final   Streptococcus species NOT DETECTED NOT DETECTED Final   Streptococcus agalactiae NOT DETECTED NOT  DETECTED Final   Streptococcus pneumoniae NOT DETECTED NOT DETECTED Final   Streptococcus pyogenes NOT DETECTED NOT DETECTED Final   A.calcoaceticus-baumannii NOT DETECTED NOT DETECTED Final   Bacteroides fragilis NOT DETECTED NOT DETECTED Final   Enterobacterales NOT DETECTED NOT DETECTED Final   Enterobacter cloacae complex NOT DETECTED NOT DETECTED Final   Escherichia coli NOT DETECTED NOT DETECTED Final   Klebsiella aerogenes NOT DETECTED NOT DETECTED Final   Klebsiella oxytoca NOT DETECTED NOT DETECTED Final   Klebsiella pneumoniae NOT DETECTED NOT DETECTED Final   Proteus species NOT DETECTED NOT DETECTED Final   Salmonella species  NOT DETECTED NOT DETECTED Final   Serratia marcescens NOT DETECTED NOT DETECTED Final   Haemophilus influenzae NOT DETECTED NOT DETECTED Final   Neisseria meningitidis NOT DETECTED NOT DETECTED Final   Pseudomonas aeruginosa NOT DETECTED NOT DETECTED Final   Stenotrophomonas maltophilia NOT DETECTED NOT DETECTED Final   Candida albicans NOT DETECTED NOT DETECTED Final   Candida auris NOT DETECTED NOT DETECTED Final   Candida glabrata NOT DETECTED NOT DETECTED Final   Candida krusei NOT DETECTED NOT DETECTED Final   Candida parapsilosis NOT DETECTED NOT DETECTED Final   Candida tropicalis NOT DETECTED NOT DETECTED Final   Cryptococcus neoformans/gattii NOT DETECTED NOT DETECTED Final   Methicillin resistance mecA/C DETECTED (A) NOT DETECTED Final    Comment: CRITICAL RESULT CALLED TO, READ BACK BY AND VERIFIED WITH: J. FRENS PHARMD, AT 4970 02/21/22 D. VANHOOK Performed at [facility], [place] [address], [place], [place] [address]      Radiology Studies: No results found.  Scheduled Meds:  amantadine  100 mg Per Tube BID   amLODipine  10 mg Per Tube Daily   chlorhexidine gluconate (MEDLINE KIT)  15 mL Mouth Rinse BID   Chlorhexidine Gluconate Cloth  6 each Topical Daily   clopidogrel  75 mg Per Tube Daily   docusate  100 mg Per Tube BID    feeding supplement (PROSource TF)  45 mL Per Tube Daily   folic acid  1 mg Per Tube Daily   free water  250 mL Per Tube Q6H   gabapentin  300 mg Per Tube BID   glycopyrrolate  1 mg Per Tube TID   guaiFENesin  10 mL Per Tube Q6H   hydrALAZINE  25 mg Per Tube Q8H   insulin aspart  0-15 Units Subcutaneous Q4H   insulin aspart  4 Units Subcutaneous Q4H   mouth rinse  15 mL Mouth Rinse 10 times per day   multivitamin with minerals  1 tablet Per Tube Daily   propranolol  40 mg Per Tube TID   rosuvastatin  40 mg Per Tube Daily   scopolamine  1 patch Transdermal Q72H   senna  1 tablet Per Tube Daily   thiamine  100 mg Per Tube Daily   Continuous Infusions:  sodium chloride Stopped (02/11/22 0928)   feeding supplement (JEVITY 1.5 CAL/FIBER) 1,000 mL (02/27/22 1144)     LOS: 39 days   Shelly Coss, MD [facility] P5/18/2023, 11:16 AM

## 2022-03-01 LAB — GLUCOSE, CAPILLARY
Glucose-Capillary: 124 mg/dL — ABNORMAL HIGH (ref 70–99)
Glucose-Capillary: 131 mg/dL — ABNORMAL HIGH (ref 70–99)
Glucose-Capillary: 137 mg/dL — ABNORMAL HIGH (ref 70–99)
Glucose-Capillary: 139 mg/dL — ABNORMAL HIGH (ref 70–99)
Glucose-Capillary: 152 mg/dL — ABNORMAL HIGH (ref 70–99)
Glucose-Capillary: 91 mg/dL (ref 70–99)

## 2022-03-01 NOTE — Progress Notes (Signed)
   NAME:  Cindy Brooks, MRN:  [id number], DOB:  1971/07/13, LOS: 40 ADMISSION DATE:  01/20/2022, CONSULTATION DATE:  4/9 REFERRING MD:  Hal Hope, CHIEF COMPLAINT:  Confusion   History of Present Illness:  51 y/o female admitted on 4/9 in the setting of a thalamic stroke, treated for possible Wernicke's encphalopathy as well.  She required intubation for airway protection on admission and had a tracheostomy placed on 4/16.  PCCM is following for tracheostomy management.  Pertinent  Medical History  Hypertension TIA  Significant Hospital Events: Including procedures, antibiotic start and stop dates in addition to other pertinent events   4/9 Admit  4/12 ongoing high dose thiamine for possible Wernike's encephalopathy  4/13 IV sedation stopped, phenobarbital wean 4/17 still not waking up. Trach placed.  4/18 PEG placed. Spiking fever. Lactate spiked. Got fluid. Cleared.  4/22 Tolerated 6hrs of ATC day prior. No major events overnight, appears that stroke had [place] meeting with family 4/21 with no decision made 4/23 No issues overnight, on vent this am, remains unresponsive  4/27 changed to propranolol from metoprolol for neuro-storming. Started on vanc and cefepime for possible HCAP  4/28 trial on open ended trach collar 4/30  trach exchange 6 cuffless 5/16 trach capped 5/18 MBS dys 1 diet  Interim History / Subjective:   Tolerating trach capping since 5/16 No respiratory distress , secretions minimal. Reviewed PT and swallow eval Afebrile  Objective   Blood pressure 130/89, pulse 83, temperature 98.8 F (37.1 C), temperature source Axillary, resp. rate (!) 25, height 5\' 4"  (1.626 m), weight 79.7 kg, SpO2 98 %.        Intake/Output Summary (Last 24 hours) at 03/01/2022 [id number] Last data filed at 03/01/2022 0700 Gross per 24 hour  Intake 910 ml  Output 1700 ml  Net -790 ml    Filed Weights   02/26/22 0509 02/27/22 0500 03/01/22 0500  Weight: 80.4 kg 80.3 kg 79.7 kg     Examination: General:  Adult female resting in bed in NAD HEENT: MM pink/moist, midline 4 cuffless trach-capped, site wnl Neuro: Lethargic, does not follow commands opens eyes to stimulus CV: rr,  no murmur PULM: No accessory muscle use, clear to auscultation GI: soft, bs+, gtube, purwick Extremities: warm/dry, generalized mild edema  Skin: no rashes   Assessment & Plan:  Thalamic stroke Acute metabolic encphalopathy Wernicke's encephalopathy Tracheostomy dependent due to inability to protect airway PEG tube  Antiphospholipid antibody syndrome with hypercoagulable state Hypertension Pressure injury of skin  Plan -Plan was to decannulate today if she tolerates dysphagia 1 diet.  However I am concerned about her fluctuating mental status .  There would be some risk for aspiration.  Plan seems to be to discharge to CIR at some point.  We will reassess suitability for decannulation   Kahlyn Shippey V. Elsworth Soho MD [facility] 03/01/2022, 9:22 AM  See Amion for pager If no response to pager, please call PCCM consult pager After 7:00 pm call Elink

## 2022-03-01 NOTE — Progress Notes (Addendum)
Inpatient Rehab Admissions:  Inpatient Rehab Consult received.  I met with patient at the bedside for rehabilitation assessment and to discuss goals and expectations of an inpatient rehab admission.  Pt was minimally communicative with AC. Pt did give permission to contact daughters. Called [place]. Received no answer so left a message; awaiting return call. Also called [name]. She acknowledged understanding of CIR goals and expectations. She is interested in pt pursuing CIR. She informed AC that she and her sister will be able to provide 24/7 support for pt after discharge. Will continue to follow.  ADDENDUM: CIR Systems analyst is verifying if insurance is in-network.  Signed: Gayland Curry, [place], [place] Admissions Coordinator [phone]

## 2022-03-01 NOTE — Progress Notes (Signed)
Physical Therapy Treatment Patient Details Name: Cindy Brooks MRN: [id number] DOB: 1970-10-19 Today's Date: 03/01/2022   History of Present Illness Pt is a 51 y.o. female with history of HTN, TIA, ETOH abuse and medication noncompliance presenting to the ED after she was found by her family to be unresponsive in her car on 4/8. 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 Intubated 4/9. Tracheostormy 4/16.  Trach capped 5/16.    PT Comments    Pt tolerates treatment well, following commands with intermittent verbal cues to regain attention. Pt is able to move all extremities although with significant weakness, and continues to require significant physical assistance for all functional mobility activities. Pt progresses to out of bed transfers with PT assistance. Pt will benefit from continued acute PT services in an effort to improve strength and reduce caregiver burden. PT continues to recommend AIR.   Recommendations for follow up therapy are one component of a multi-disciplinary discharge planning process, led by the attending physician.  Recommendations may be updated based on patient status, additional functional criteria and insurance authorization.  Follow Up Recommendations  Acute inpatient rehab (3hours/day)     Assistance Recommended at Discharge Frequent or constant Supervision/Assistance  Patient can return home with the following Two people to help with walking and/or transfers;Two people to help with bathing/dressing/bathroom   Equipment Recommendations  Hospital bed;Wheelchair (measurements PT);Wheelchair cushion (measurements PT);Other (comment) (hoyer lift)    Recommendations for Other Services       Precautions / Restrictions Precautions Precautions: Fall Precaution Comments: trach  (capped)/PEG Restrictions Weight Bearing Restrictions: No     Mobility  Bed Mobility Overal bed mobility: Needs Assistance Bed Mobility: Supine to Sit     Supine to sit: Max assist, HOB elevated     General bed mobility comments: pt requires assist to mobilize both LEs to edge of bed as well as assist to elevate trunk and pivot hips. Pt participates in all aspects of bed mobility but remains profoundly weak    Transfers Overall transfer level: Needs assistance Equipment used: 1 person hand held assist Transfers: Sit to/from Stand, Bed to chair/wheelchair/BSC Sit to Stand: Max assist     Squat pivot transfers: Max assist     General transfer comment: pt performs 3 sit to stand transfers and one squat pivot, BUE support and unilateral knee block provided by PT    Ambulation/Gait                   Stairs             Wheelchair Mobility    Modified Rankin (Stroke Patients Only) Modified Rankin (Stroke Patients Only) Pre-Morbid Rankin Score: No symptoms Modified Rankin: Severe disability     Balance Overall balance assessment: Needs assistance Sitting-balance support: No upper extremity supported, Feet supported Sitting balance-Leahy Scale: Poor Sitting balance - Comments: minG-minA, posterior lean, pt is able to correct with verbal cues from PT Postural control: Posterior lean Standing balance support: Bilateral upper extremity supported Standing balance-Leahy Scale: Poor Standing balance comment: maxA                            Cognition Arousal/Alertness: Awake/alert Behavior During Therapy: Flat affect Overall Cognitive Status: Difficult to assess Area of Impairment: Attention, Following commands, Problem solving  Current Attention Level: Focused   Following Commands: Follows one step commands with increased time     Problem Solving: Slow processing, Difficulty sequencing, Requires verbal cues, Requires  tactile cues General Comments: follows simple one step commands, at times requires repeated questions as she appears to lose attention        Exercises Other Exercises Other Exercises: PROM to BUE, elbow flexion/extension, shoulder flexion    General Comments General comments (skin integrity, edema, etc.): VSS, trach capped, RR ranging from 25-30 with mobility. SpO2 stable when with reliable reading      Pertinent Vitals/Pain Pain Assessment Pain Assessment: No/denies pain    Home Living   Living Arrangements: Other (Comment) (had a roomate) Available Help at Discharge: Family;Available 24 hours/day Type of Home: House Home Access: Stairs to enter Entrance Stairs-Rails: None Entrance Stairs-Number of Steps: 3   Home Layout: One level        Prior Function            PT Goals (current goals can now be found in the care plan section) Acute Rehab PT Goals Patient Stated Goal: unable to state Progress towards PT goals: Progressing toward goals    Frequency    Min 4X/week      PT Plan Current plan remains appropriate    Co-evaluation              AM-PAC PT "6 Clicks" Mobility   Outcome Measure  Help needed turning from your back to your side while in a flat bed without using bedrails?: A Lot Help needed moving from lying on your back to sitting on the side of a flat bed without using bedrails?: A Lot Help needed moving to and from a bed to a chair (including a wheelchair)?: A Lot Help needed standing up from a chair using your arms (e.g., wheelchair or bedside chair)?: A Lot Help needed to walk in hospital room?: Total Help needed climbing 3-5 steps with a railing? : Total 6 Click Score: 10    End of Session   Activity Tolerance: Patient tolerated treatment well Patient left: in chair;with call bell/phone within reach;with chair alarm set Nurse Communication: Mobility status;Need for lift equipment PT Visit Diagnosis: Other abnormalities of gait and  mobility (R26.89);Other symptoms and signs involving the nervous system (R29.898)     Time: 1401-1430 PT Time Calculation (min) (ACUTE ONLY): 29 min  Charges:  $Therapeutic Activity: 23-37 mins                     Zenaida Niece, PT, DPT Acute Rehabilitation Pager: [phone] Office [place] Larrie Lucia 03/01/2022, 2:50 PM

## 2022-03-01 NOTE — Progress Notes (Signed)
   Inpatient Rehab Admissions Coordinator :  Per therapy change in recommendations, patient was screened for CIR candidacy by Ottie Glazier RN MSN.  At this time patient appears to be a potential candidate for CIR. I will place a rehab consult per protocol for full assessment. Please call me with any questions. We are in network for [facility].  Ottie Glazier RN MSN Admissions Coordinator [phone]

## 2022-03-01 NOTE — Progress Notes (Signed)
PROGRESS NOTE  Cindy Brooks  EYC:[id number] DOB: Feb 14, 1971 DOA: 01/20/2022 PCP: Pcp, No   Brief Narrative:  Patient is a 51 year old female with history of uncontrolled hypertension, history of TIA who presented to the [facility] emergency department on 4/9 after being found unresponsive by his family.  Patient was found to be obtunded with copious secretions, subsequently intubated on 4/9 for airway protection.  Hospital course remarkable for uncontrolled hypertension, work-up also showed thalamic stroke.  Wernicke's encephalopathy also suspected.  Transferred to [facility] service.    PCCM following with plan for decannulation at some point.  Pending placement.  Important events:  4/9 Admit  4/12 ongoing high dose thiamine for possible Wernike's encephalopathy  4/13 IV sedation stopped, phenobarbital wean 4/17 still not waking up. Trach placed.  4/18 PEG placed. Spiking fever. Lactate spiked. Got fluid. Cleared.  4/22 Tolerated 6hrs of ATC day prior. No major events overnight, appears that stroke had [place] meeting with family 4/21 with no decision made 4/23 No issues overnight, on vent this am, remains unresponsive  4/27 changed to propranolol from metoprolol for neuro-storming. Started on vanc and cefepime for possible HCAP  4/28 trial on open ended trach collar 5/2 patient transferred to Hospitalist Service.  5/14 waking up and following simple commands. 5/15 downsized tracheostomy to #4 5/16 tracheostomy capping trials.   Assessment & Plan:  Principal Problem:   Thalamic stroke ([place]) Active Problems:   Acute encephalopathy   Respiratory failure (HCC)   Tracheostomy dependence (HCC)   Hypertension   PEG (percutaneous endoscopic gastrostomy) status (HCC)   Pressure injury of skin   Antiphospholipid antibody with hypercoagulable state ([place])   Hypernatremia   Coma ([place])   Fever   Leukocytosis   Sympathetic storming   HCAP (healthcare-associated pneumonia)  Acute metabolic  encephalopathy: Unresponsive on presentation.  Multifactorial: Thalamic stroke, Warnicke's encephalopathy.  Monitor mental status. MRI brain on 4/11 showed patchy restricted diffusion on both thalami and midbrain.CT angiogram head and neck 4/11, severe bilateral P1 and right P2 PCA stenosis, bilateral ophthalmic infarct, severe left proximal M2 MCA branch stenosis, severe right A2 ACA branch stenosis, no evidence of dural venous thrombosis. Warnicke's encephalopathy was suspected and he was given high-dose thiamine.  Continue oral thiamine.  Patient was also on phenobarbital which was weaned off. Status post trach, PEG placement.  Remains on amantadine, gabapentin.  Acute hypoxic respiratory failure: Intubated on presentation for airway protection.  Prolonged ventilator use.  Currently status post tracheostomy.  Currently on trach collar.  Hospital course also remarkable for healthcare associated pneumonia, treated with antibiotics, completed the course.  Repeat chest x-ray showed chronic atelectasis.  No evidence of new findings.  PCCM following and planning for decannulation at some point  Thalamic stroke: MRI as above.  Positive anticardiolipin IgM, lupus anticoagulant: Neurology was following and recommended Plavix.  Dysphagia: Status post PEG.  Continue tube feeding.  Speech therapy closely following, recommending dysphagia 1 diet  Hypertension: Currently blood pressure stable.  On amlodipine, propanolol, hydralazine  History of hyperlipidemia: On Crestor  Obesity: BMI of 30.3  Debility: Patient seen by PT and OT and recommended CIR.  Likely the discharge plan will be to SNF        Nutrition Problem: Inadequate oral intake Etiology: inability to eat Pressure Injury 01/31/22 [place] Bilateral Stage 2 -  Partial thickness loss of dermis presenting as a shallow open injury with a red, pink wound bed without slough. purewick device related skin tear, bilat lower labia (Active)  01/31/22  2200  Location: [place]  Location Orientation: Bilateral  Staging: Stage 2 -  Partial thickness loss of dermis presenting as a shallow open injury with a red, pink wound bed without slough.  Wound Description (Comments): purewick device related skin tear, bilat lower labia  Present on Admission:   Dressing Type Foam - Lift dressing to assess site every shift 02/28/22 2000    DVT prophylaxis:SCDs Start: 01/20/22 1012     Code Status: Full Code  Family Communication: Family member at bedside  Patient status:Inpatient  Patient is from :Home  Anticipated discharge to:SnF/CIR  Estimated DC date:Not sure   Consultants: PCCM  Procedures: Intubation, extubation, trach, PEG  Antimicrobials:  Anti-infectives (From admission, onward)    Start     Dose/Rate Route Frequency Ordered Stop   02/08/22 1800  vancomycin (VANCOREADY) IVPB 1500 mg/300 mL  Status:  Discontinued        1,500 mg 150 mL/hr over 120 Minutes Intravenous Every 24 hours 02/07/22 1712 02/09/22 1046   02/07/22 1800  vancomycin (VANCOREADY) IVPB 1750 mg/350 mL        1,750 mg 175 mL/hr over 120 Minutes Intravenous  Once 02/07/22 1708 02/07/22 2046   02/07/22 1700  ceFEPIme (MAXIPIME) 2 g in sodium chloride 0.9 % 100 mL IVPB        2 g 200 mL/hr over 30 Minutes Intravenous Every 8 hours 02/07/22 1706 02/14/22 0856   01/20/22 2000  cefTRIAXone (ROCEPHIN) 2 g in sodium chloride 0.9 % 100 mL IVPB        2 g 200 mL/hr over 30 Minutes Intravenous Every 24 hours 01/20/22 0634 01/24/22 2022   01/20/22 0415  cefTRIAXone (ROCEPHIN) 2 g in sodium chloride 0.9 % 100 mL IVPB        2 g 200 mL/hr over 30 Minutes Intravenous  Once 01/20/22 0414 01/20/22 0537       Subjective:  Patient seen and examined at the bedside this morning.  Hemodynamically stable.  Alert, awake, sitting up in the bed.  Persistent aphasia, not in distress Objective: Vitals:   03/01/22 0324 03/01/22 0500 03/01/22 0753 03/01/22 0850  BP: (!) 106/91   130/89   Pulse: 79  84 83  Resp: 20  (!) 23 (!) 25  Temp: 99 F (37.2 C)  98.8 F (37.1 C)   TempSrc: Axillary  Axillary   SpO2: 99%  98%   Weight:  79.7 kg    Height:        Intake/Output Summary (Last 24 hours) at 03/01/2022 1032 Last data filed at 03/01/2022 0700 Gross per 24 hour  Intake 910 ml  Output 1700 ml  Net -790 ml   Filed Weights   02/26/22 0509 02/27/22 0500 03/01/22 0500  Weight: 80.4 kg 80.3 kg 79.7 kg    Examination:   General exam: Overall comfortable, not in distress, very deconditioned HEENT: Left eye closed,trach collar Respiratory system:  no wheezes or crackles  Cardiovascular system: S1 & S2 heard, RRR.  Gastrointestinal system: Abdomen is nondistended, soft and nontender.  PEG Central nervous system: Alert and awake, aphasic, generalized weakness Extremities: Edema of hands, no clubbing ,no cyanosis Skin: No rashes, no ulcers,no icterus     Data Reviewed: I have personally reviewed following labs and imaging studies  CBC: Recent Labs  Lab 02/25/22 0212  WBC 7.2  NEUTROABS 4.7  HGB 11.2*  HCT 34.0*  MCV 81.5  PLT 734   Basic Metabolic Panel: Recent Labs  Lab 02/25/22 0212  NA  137  K 4.2  CL 99  CO2 28  GLUCOSE 113*  BUN 14  CREATININE 0.54  CALCIUM 9.4  MG 2.1  PHOS 4.0     Recent Results (from the past 240 hour(s))  Culture, blood (Routine X 2) w Reflex to ID Panel     Status: None   Collection Time: 02/20/22 11:03 AM   Specimen: BLOOD LEFT WRIST  Result Value Ref Range Status   Specimen Description BLOOD LEFT WRIST  Final   Special Requests   Final    BOTTLES DRAWN AEROBIC AND ANAEROBIC Blood Culture results may not be optimal due to an inadequate volume of blood received in culture bottles   Culture   Final    NO GROWTH 5 DAYS Performed at [facility], [place] [address], [place], [place] [address]    Report Status 02/25/2022 FINAL  Final  Culture, blood (Routine X 2) w Reflex to ID Panel     Status: Abnormal    Collection Time: 02/20/22 11:04 AM   Specimen: BLOOD LEFT HAND  Result Value Ref Range Status   Specimen Description BLOOD LEFT HAND  Final   Special Requests   Final    BOTTLES DRAWN AEROBIC AND ANAEROBIC Blood Culture results may not be optimal due to an inadequate volume of blood received in culture bottles   Culture  Setup Time   Final    GRAM POSITIVE COCCI IN BOTH AEROBIC AND ANAEROBIC BOTTLES Organism ID to follow CRITICAL RESULT CALLED TO, READ BACK BY AND VERIFIED WITH: J. FRENS PHARMD, AT 0626 02/21/22 D. VANHOOK    Culture (A)  Final    STAPHYLOCOCCUS EPIDERMIDIS THE SIGNIFICANCE OF ISOLATING THIS ORGANISM FROM A SINGLE SET OF BLOOD CULTURES WHEN MULTIPLE SETS ARE DRAWN IS UNCERTAIN. PLEASE NOTIFY THE MICROBIOLOGY DEPARTMENT WITHIN ONE WEEK IF SPECIATION AND SENSITIVITIES ARE REQUIRED. Performed at [facility], [place] [address] [place], [place] [address]    Report Status 02/23/2022 FINAL  Final  Blood Culture ID Panel (Reflexed)     Status: Abnormal   Collection Time: 02/20/22 11:04 AM  Result Value Ref Range Status   Enterococcus faecalis NOT DETECTED NOT DETECTED Final   Enterococcus Faecium NOT DETECTED NOT DETECTED Final   Listeria monocytogenes NOT DETECTED NOT DETECTED Final   Staphylococcus species DETECTED (A) NOT DETECTED Final    Comment: CRITICAL RESULT CALLED TO, READ BACK BY AND VERIFIED WITH: J. FRENS PHARMD, AT 6270 02/21/22 D. VANHOOK    Staphylococcus aureus (BCID) NOT DETECTED NOT DETECTED Final   Staphylococcus epidermidis DETECTED (A) NOT DETECTED Final    Comment: Methicillin (oxacillin) resistant coagulase negative staphylococcus. Possible blood culture contaminant (unless isolated from more than one blood culture draw or clinical case suggests pathogenicity). No antibiotic treatment is indicated for blood  culture contaminants. CRITICAL RESULT CALLED TO, READ BACK BY AND VERIFIED WITH: J. FRENS PHARMD, AT 3500 02/21/22 D. VANHOOK     Staphylococcus lugdunensis NOT DETECTED NOT DETECTED Final   Streptococcus species NOT DETECTED NOT DETECTED Final   Streptococcus agalactiae NOT DETECTED NOT DETECTED Final   Streptococcus pneumoniae NOT DETECTED NOT DETECTED Final   Streptococcus pyogenes NOT DETECTED NOT DETECTED Final   A.calcoaceticus-baumannii NOT DETECTED NOT DETECTED Final   Bacteroides fragilis NOT DETECTED NOT DETECTED Final   Enterobacterales NOT DETECTED NOT DETECTED Final   Enterobacter cloacae complex NOT DETECTED NOT DETECTED Final   Escherichia coli NOT DETECTED NOT DETECTED Final   Klebsiella aerogenes NOT DETECTED NOT DETECTED  Final   Klebsiella oxytoca NOT DETECTED NOT DETECTED Final   Klebsiella pneumoniae NOT DETECTED NOT DETECTED Final   Proteus species NOT DETECTED NOT DETECTED Final   Salmonella species NOT DETECTED NOT DETECTED Final   Serratia marcescens NOT DETECTED NOT DETECTED Final   Haemophilus influenzae NOT DETECTED NOT DETECTED Final   Neisseria meningitidis NOT DETECTED NOT DETECTED Final   Pseudomonas aeruginosa NOT DETECTED NOT DETECTED Final   Stenotrophomonas maltophilia NOT DETECTED NOT DETECTED Final   Candida albicans NOT DETECTED NOT DETECTED Final   Candida auris NOT DETECTED NOT DETECTED Final   Candida glabrata NOT DETECTED NOT DETECTED Final   Candida krusei NOT DETECTED NOT DETECTED Final   Candida parapsilosis NOT DETECTED NOT DETECTED Final   Candida tropicalis NOT DETECTED NOT DETECTED Final   Cryptococcus neoformans/gattii NOT DETECTED NOT DETECTED Final   Methicillin resistance mecA/C DETECTED (A) NOT DETECTED Final    Comment: CRITICAL RESULT CALLED TO, READ BACK BY AND VERIFIED WITH: J. FRENS PHARMD, AT 1950 02/21/22 D. VANHOOK Performed at [facility], [place] [address] [place], [place] [address]      Radiology Studies: DG Swallowing Func-Speech Pathology  Result Date: 02/28/2022 Table formatting from the original result was not included. Objective  Swallowing Evaluation: Type of Study: MBS-Modified Barium Swallow Study  Patient Details Name: Jonita Hirota MRN: [id number] Date of Birth: 12-21-1970 Today's Date: 02/28/2022 Time: SLP Start Time (ACUTE ONLY): 3382 -SLP Stop Time (ACUTE ONLY): 5053 SLP Time Calculation (min) (ACUTE ONLY): 30 min Past Medical History: Past Medical History: Diagnosis Date  Hypertension   Hypertensive urgency 01/20/2022  Seizure ([place]) 01/28/2022  TIA (transient ischemic attack) 2019  Urinary tract infection 01/28/2022 Past Surgical History: Past Surgical History: Procedure Laterality Date  ESOPHAGOGASTRODUODENOSCOPY N/A 01/28/2022  Procedure: ESOPHAGOGASTRODUODENOSCOPY (EGD);  Surgeon: Jesusita Oka, MD;  Location: [facility] ENDOSCOPY;  Service: General;  Laterality: N/A;  PEG PLACEMENT N/A 01/28/2022  Procedure: PERCUTANEOUS ENDOSCOPIC GASTROSTOMY (PEG) PLACEMENT;  Surgeon: Jesusita Oka, MD;  Location: [facility] ENDOSCOPY;  Service: General;  Laterality: N/A; HPI: Pt is a 51 y/o female who presented to the ED after being found unresponsive in her care. Pt admitted 4/9 with thalamic stroke, treated for possible acute metabolic and wernicke's encphalopathy as well. She required intubation for airway protection on admission and had a tracheostomy placed on 4/16.  PEG on 4/18. MRI 4/19: Multiple foci of restricted diffusion in the bilateral frontal lobes and parietal lobes primarily. Started tolerating ATC on 4/22. Has suffered from severe encephalopathy, neurostorming. Started following commands around 5/15. A time of evaluation, pt with a 4 cuffless trach which is capped and may be decannulated within the next 24-48 hours. PMH: HTN, TIA, ETOH abuse and medication noncompliance.  No data recorded  Recommendations for follow up therapy are one component of a multi-disciplinary discharge planning process, led by the attending physician.  Recommendations may be updated based on patient status, additional functional criteria and insurance authorization.  Assessment / Plan / Recommendation   02/28/2022  12:38 PM Clinical Impressions Clinical Impression Pt was lethargic throughout the study despite sternal rub and thermotactile stimulation with cold washcloth, and pt exhibited difficulty maintaining an adequate level of alertness once improved alertness was achieved. SLP suspects that this, combined with the flavor the barium, likely impacted pt's performance during the study. However, the study was somewhat limited due to pt's lethargy. She exhibited worse bolus awareness than has been exhibited at bedside, and significant oral holding was noted with purees and  thin liquids. Oral suctioning was necessary once due to the extent of oral holding. Minimal premature spillage to the valleculae was noted with a thin liquid bolus via straw; however, pt was otherwise able to contain large liquid boluses in the oral cavity with no instances of premature spillage even when a posterior head tilt was facilitated. With verbal and tactile prompts and cues, once the swallow was triggered, pharyngeal clearance was WNL and no instances of penetration/aspiration were demonstrated. Considering pt's current lethargy, diet initiation will be deferred today. However, once pt's presentation is more similar to that noted this morning in treatment, a dysphagia 1 diet with thin liquids will likely be initiated. SLP Visit Diagnosis Dysphagia, oral phase (R13.11) Impact on safety and function Mild aspiration risk     02/28/2022  12:38 PM Treatment Recommendations Treatment Recommendations Therapy as outlined in treatment plan below     02/28/2022  12:38 PM Prognosis Prognosis for Safe Diet Advancement Good Barriers to Reach Goals Cognitive deficits;Language deficits;Time post onset   02/28/2022  12:38 PM Diet Recommendations SLP Diet Recommendations Dysphagia 1 (Puree) solids;Thin liquid Liquid Administration via Cup;Straw Medication Administration Via alternative means Compensations Slow rate;Small  sips/bites;Minimize environmental distractions Postural Changes Seated upright at 90 degrees     02/28/2022  12:38 PM Other Recommendations Oral Care Recommendations Oral care BID Follow Up Recommendations Skilled nursing-short term rehab (<3 hours/day) Assistance recommended at discharge Frequent or constant Supervision/Assistance Functional Status Assessment Patient has had a recent decline in their functional status and demonstrates the ability to make significant improvements in function in a reasonable and predictable amount of time.   02/28/2022  12:38 PM Frequency and Duration  Speech Therapy Frequency (ACUTE ONLY) min 2x/week Treatment Duration 2 weeks     02/28/2022  12:38 PM Oral Phase Oral Phase Impaired Oral - Thin Cup Delayed oral transit;Holding of bolus Oral - Thin Straw Delayed oral transit;Holding of bolus Oral - Puree Delayed oral transit;Holding of bolus    02/28/2022  12:38 PM Pharyngeal Phase Pharyngeal Phase [facility]    02/28/2022  12:38 PM Cervical Esophageal Phase  Cervical Esophageal Phase [facility] Shanika I. Hardin Negus, [place], [place] Office number [phone] Pager [phone] H[place] Marshall 02/28/2022, 1:51 PM                      Scheduled Meds:  amantadine  100 mg Per Tube BID   amLODipine  10 mg Per Tube Daily   chlorhexidine gluconate (MEDLINE KIT)  15 mL Mouth Rinse BID   Chlorhexidine Gluconate Cloth  6 each Topical Daily   clopidogrel  75 mg Per Tube Daily   docusate  100 mg Per Tube BID   feeding supplement (PROSource TF)  45 mL Per Tube Daily   folic acid  1 mg Per Tube Daily   free water  250 mL Per Tube Q6H   gabapentin  300 mg Per Tube BID   glycopyrrolate  1 mg Per Tube TID   guaiFENesin  10 mL Per Tube Q6H   hydrALAZINE  25 mg Per Tube Q8H   insulin aspart  0-15 Units Subcutaneous Q4H   insulin aspart  4 Units Subcutaneous Q4H   mouth rinse  15 mL Mouth Rinse 10 times per day   multivitamin with minerals  1 tablet Per Tube Daily    propranolol  40 mg Per Tube TID   rosuvastatin  40 mg Per Tube Daily   senna  1 tablet Per Tube Daily  thiamine  100 mg Per Tube Daily   Continuous Infusions:  sodium chloride Stopped (02/11/22 0928)   feeding supplement (JEVITY 1.5 CAL/FIBER) 1,000 mL (03/01/22 0831)     LOS: 40 days   Shelly Coss, MD [facility] P5/19/2023, 10:32 AM

## 2022-03-01 NOTE — Progress Notes (Signed)
Pt decannulated and tolerated well. Occlusive dressing placed over stoma. Pt's sat is 97%. RT will monitor.

## 2022-03-01 NOTE — Progress Notes (Addendum)
Patient added to DTP list for discharge planning.  CSW spoke with patient's daughter [name] to inquire about support after discharge. [name] states she and her sister would be able to provide the patient with 24/7 care at discharge.  CSW notified Britta Mccreedy of CIR of information. Britta Mccreedy will determine if patient's insurance is in network with [facility] and notify CSW.  Edwin Dada, MSW, LCSW Transitions of Care  Clinical Social Worker II [phone]

## 2022-03-01 NOTE — Progress Notes (Signed)
Speech Language Pathology Treatment: Dysphagia  Patient Details Name: Cindy Brooks MRN: [id number] DOB: 12-12-1970 Today's Date: 03/01/2022 Time: [id number] SLP Time Calculation (min) (ACUTE ONLY): 16 min  Assessment / Plan / Recommendation Clinical Impression  Pt was seen for dysphagia treatment. A dysphagia 1 diet with thin liquids was initiated by Dr. Tawanna Solo this morning. Pt's RN and NT reported that the pt has been tolerating the current diet without overt s/sx of aspiration and pt consumed 50% of lunch. Pt was more alert during the session than was demonstrated during yesterday's MBS. Pt communicated using gestures and spontaneously produced a single phrase. She tolerated puree solids, regular texture solids, and thin liquids via straw without overt s/sx of aspiration. Pt independently exhibited biting and mastication of regular texture solids which Mastication was notably improved compared to that demonstrated during the initial evaluation on 5/17 when no mastication was noted. Pt independently masticated boluses, but cues were intermittently necessary for swallowing regular textures. Less pronounced oral holding was observed with purees and thin liquids. Pt's current diet of dysphagia 1 solids and thin liquids will be continued. However, prognosis for diet advancement is judged to be good if she continues to progress at the level. SLP will continue to follow pt.     HPI HPI: Pt is a 51 y/o female who presented to the ED after being found unresponsive in her care. Pt admitted 4/9 with thalamic stroke, treated for possible acute metabolic and wernicke's encphalopathy as well. She required intubation for airway protection on admission and had a tracheostomy placed on 4/16.  PEG on 4/18. MRI 4/19: Multiple foci of restricted diffusion in the bilateral frontal lobes and parietal lobes primarily. Started tolerating ATC on 4/22. Has suffered from severe encephalopathy, neurostorming. Started following  commands around 5/15. At time of evaluation, pt with a 4 cuffless trach which is capped and may be decannulated within the next 24-48 hours. PMH: HTN, TIA, ETOH abuse and medication noncompliance.      SLP Plan  Continue with current plan of care      Recommendations for follow up therapy are one component of a multi-disciplinary discharge planning process, led by the attending physician.  Recommendations may be updated based on patient status, additional functional criteria and insurance authorization.    Recommendations  Diet recommendations: Dysphagia 1 (puree);Thin liquid Liquids provided via: Cup;Straw Medication Administration: Via alternative means (crushed) Supervision: Patient able to self feed Compensations: Slow rate;Small sips/bites;Minimize environmental distractions Postural Changes and/or Swallow Maneuvers: Seated upright 90 degrees                Oral Care Recommendations: Oral care BID Follow Up Recommendations: Skilled nursing-short term rehab (<3 hours/day) Assistance recommended at discharge: Frequent or constant Supervision/Assistance SLP Visit Diagnosis: Dysphagia, unspecified (R13.10) Plan: Continue with current plan of care       Davionna Blacksher I. Hardin Negus, [place], [place] Office number [phone] Pager [place]  03/01/2022, 3:42 PM

## 2022-03-02 LAB — GLUCOSE, CAPILLARY
Glucose-Capillary: 141 mg/dL — ABNORMAL HIGH (ref 70–99)
Glucose-Capillary: 129 mg/dL — ABNORMAL HIGH (ref 70–99)
Glucose-Capillary: 138 mg/dL — ABNORMAL HIGH (ref 70–99)
Glucose-Capillary: 149 mg/dL — ABNORMAL HIGH (ref 70–99)
Glucose-Capillary: 136 mg/dL — ABNORMAL HIGH (ref 70–99)

## 2022-03-02 NOTE — Progress Notes (Signed)
Pt remains stable after decannulation yesterday. Clean gauze is over the stoma site. Pt is on RA sating 98% with no respiratory distress.

## 2022-03-02 NOTE — PMR Pre-admission (Shared)
PMR Admission Coordinator Pre-Admission Assessment  Patient: Cindy Brooks is an 51 y.o., female MRN: [id number] DOB: 07-Aug-1971 Height: 5' 4" (162.6 cm) Weight: 79.8 kg  Insurance Information HMO: yes    PPO:      PCP:      IPA:      80/20:      OTHER:  PRIMARY: [employer]    Policy#: [id number]      Subscriber: patient CM Name: ***      Phone#: ***     Fax#: [phone] Pre-Cert#: [id number]      Employer:  Benefits:  Phone #: [phone], [email or web address]     Name:  Eff. Date: 12/12/21-10/13/22     Deduct: $7,500 ($0 met)      Out of Pocket Max: $9,000 ($0 met)      Life Max: NA CIR: 50% coverage, 50% co-insurance    SNF: 50% coverage, 50% co-insurance; limited to 60 days/cal year Outpatient: 50% coverage     Co-Pay: 50% co-insurance Home Health: 50% coverage      Co-Pay: 50% co-insurance DME: 50% coverage     Co-Pay: 50% co-insurance Providers: in-network SECONDARY:       Policy#:      Phone#:   Development worker, community:       Phone#:   The Engineer, petroleum" for patients in Inpatient Rehabilitation Facilities with attached "Privacy Act [place] Records" was provided and verbally reviewed with: N/A  Emergency Contact Information Contact Information     Name Relation Home Work Mobile   Numidia Daughter [phone]     Mayra, Jolliffe Daughter [phone]         Current Medical History  Patient Admitting Diagnosis: Thalamic stroke History of Present Illness: Pt is a 51 year old female with medical hx significant for: TIA, HTN, ETOH abuse, medication noncompliance. Pt presented to the [facility] ED on 01/20/22 after being found unresponsive outside of her home.  Pt admitted with thalamic stroke, treated for possible acute metabolic and wernicke's encphalopathy as well. She required intubation for airway protection on admission and had a tracheostomy placed on 4/16.  PEG tube placement on 4/18. MRI 4/19: Multiple foci of  restricted diffusion in the bilateral frontal lobes and parietal lobes primarily. Started tolerating ATC on 4/22. Has suffered from severe encephalopathy, neurostorming. Pt decannulated 5/19. Therapy evaluations completed and CIR recommended d/t pt's deficits in functional mobility, inability to complete ADLs independently, dysphagia and cognitive-linguistic deficits.***  Complete NIHSS TOTAL: 21  Patient's medical record from [facility] has been reviewed by the rehabilitation admission coordinator and physician.  Past Medical History  Past Medical History:  Diagnosis Date   Hypertension    Hypertensive urgency 01/20/2022   Seizure ([place]) 01/28/2022   TIA (transient ischemic attack) 2019   Urinary tract infection 01/28/2022    Has the patient had major surgery during 100 days prior to admission? Yes  Family History   family history includes Diabetes Mellitus II in her maternal grandmother.  Current Medications  Current Facility-Administered Medications:    0.9 %  sodium chloride infusion, , Intravenous, PRN, Rosalin Hawking, MD, Stopped at 02/11/22 4801   acetaminophen (TYLENOL) 160 MG/5ML solution 650 mg, 650 mg, Per Tube, Q6H PRN, Anders Simmonds, MD, 650 mg at 02/25/22 2101   amantadine (SYMMETREL) 50 MG/5ML solution 100 mg, 100 mg, Per Tube, BID, Agarwala, Ravi, MD, 100 mg at 03/04/22 0907   amLODipine (NORVASC) tablet 10 mg, 10 mg, Per Tube, Daily, Jacky Kindle, MD, 10 mg  at 03/04/22 0907   chlorhexidine gluconate (MEDLINE KIT) (PERIDEX) 0.12 % solution 15 mL, 15 mL, Mouth Rinse, BID, Olalere, Adewale A, MD, 15 mL at 03/04/22 0905   Chlorhexidine Gluconate Cloth 2 % PADS 6 each, 6 each, Topical, Daily, Olalere, Adewale A, MD, 6 each at 03/02/22 1720   clopidogrel (PLAVIX) tablet 75 mg, 75 mg, Per Tube, Daily, Chand, Sudham, MD, 75 mg at 03/04/22 0908   docusate (COLACE) 50 MG/5ML liquid 100 mg, 100 mg, Per Tube, BID, Spero Geralds, MD, 100 mg at 03/04/22 6761   feeding  supplement (JEVITY 1.5 CAL/FIBER) liquid 1,000 mL, 1,000 mL, Per Tube, Continuous, Agarwala, Ravi, MD, Last Rate: 55 mL/hr at 03/04/22 0309, 1,000 mL at 03/04/22 0309   feeding supplement (PROSource TF) liquid 45 mL, 45 mL, Per Tube, Daily, Spero Geralds, MD, 45 mL at 95/09/32 6712   folic acid (FOLVITE) tablet 1 mg, 1 mg, Per Tube, Daily, Spero Geralds, MD, 1 mg at 03/04/22 0908   free water 250 mL, 250 mL, Per Tube, Q6H, Hosie Poisson, MD, 250 mL at 03/04/22 1243   gabapentin (NEURONTIN) 250 MG/5ML solution 100 mg, 100 mg, Per Tube, Q12H, Adhikari, Amrit, MD, 100 mg at 03/04/22 4580   Gerhardt's butt cream, , Topical, Daily PRN, Barb Merino, MD, Given at 03/01/22 0818   glycopyrrolate (ROBINUL) tablet 1 mg, 1 mg, Per Tube, BID, Adhikari, Amrit, MD, 1 mg at 03/04/22 0909   guaiFENesin (ROBITUSSIN) 100 MG/5ML liquid 10 mL, 10 mL, Per Tube, Q6H, Clark, Laura P, DO, 10 mL at 03/04/22 1517   hydrALAZINE (APRESOLINE) tablet 25 mg, 25 mg, Per Tube, Q8H, Akula, Vijaya, MD, 25 mg at 03/04/22 1517   insulin aspart (novoLOG) injection 0-15 Units, 0-15 Units, Subcutaneous, Q4H, Mohan, Kinila T, MD, 2 Units at 03/04/22 1244   insulin aspart (novoLOG) injection 4 Units, 4 Units, Subcutaneous, Q4H, Agarwala, Ravi, MD, 4 Units at 03/04/22 1518   ipratropium-albuterol (DUONEB) 0.5-2.5 (3) MG/3ML nebulizer solution 3 mL, 3 mL, Nebulization, Q6H PRN, Hosie Poisson, MD   labetalol (NORMODYNE) injection 10 mg, 10 mg, Intravenous, Q2H PRN, Halford Chessman, Vineet, MD, 10 mg at 03/04/22 9983   MEDLINE mouth rinse, 15 mL, Mouth Rinse, 10 times per day, Olalere, Adewale A, MD, 15 mL at 03/04/22 1518   metoprolol tartrate (LOPRESSOR) injection 2.5 mg, 2.5 mg, Intravenous, Q6H PRN, Kenton Kingfisher, Whitney D, NP, 2.5 mg at 02/07/22 0844   multivitamin with minerals tablet 1 tablet, 1 tablet, Per Tube, Daily, Spero Geralds, MD, 1 tablet at 03/04/22 0908   polyethylene glycol (MIRALAX / GLYCOLAX) packet 17 g, 17 g, Per Tube, Daily PRN,  Spero Geralds, MD   promethazine (PHENERGAN) 6.25 MG/5ML syrup 12.5 mg, 12.5 mg, Per Tube, Q6H PRN, Barb Merino, MD   propranolol (INDERAL) tablet 40 mg, 40 mg, Per Tube, TID, Erick Colace, NP, 40 mg at 03/04/22 1518   rosuvastatin (CRESTOR) tablet 40 mg, 40 mg, Per Tube, Daily, Shafer, Devon, NP, 40 mg at 03/04/22 3825   senna (SENOKOT) tablet 8.6 mg, 1 tablet, Per Tube, Daily, Agarwala, Ravi, MD, 8.6 mg at 03/04/22 0907   [COMPLETED] thiamine (B-1) 250 mg in sodium chloride 0.9 % 50 mL IVPB, 250 mg, Intravenous, Q24H, Stopped at 01/27/22 1005 **FOLLOWED BY** thiamine tablet 100 mg, 100 mg, Per Tube, Daily, Garvin Fila, MD, 100 mg at 03/04/22 0539  Patients Current Diet:  Diet Order             DIET -  DYS 1 Room service appropriate? Yes; Fluid consistency: Thin  Diet effective now                   Precautions / Restrictions Precautions Precautions: Fall Precaution Comments: PEG, stoma Restrictions Weight Bearing Restrictions: No   Has the patient had 2 or more falls or a fall with injury in the past year? No  Prior Activity Level Community (5-7x/wk): works, got out of house daily  Prior Functional Level Self Care: Did the patient need help bathing, dressing, using the toilet or eating? Independent  Indoor Mobility: Did the patient need assistance with walking from room to room (with or without device)? Independent  Stairs: Did the patient need assistance with internal or external stairs (with or without device)? Independent  Functional Cognition: Did the patient need help planning regular tasks such as shopping or remembering to take medications? Independent  Patient Information    Patient's Response To:     Home Assistive Devices / Equipment Home Assistive Devices/Equipment: Feeding equipment, Vent/Trach supplies, Wheelchair, Oxygen  Prior Device Use: Indicate devices/aids used by the patient prior to current illness, exacerbation or injury? None of  the above  Current Functional Level Cognition  Arousal/Alertness: Awake/alert Overall Cognitive Status: Difficult to assess Difficult to assess due to: Impaired communication Current Attention Level: Focused Orientation Level: Oriented to person, Disoriented to place, Disoriented to time, Disoriented to situation Following Commands: Follows one step commands with increased time General Comments: very lethargic this day, difficult to maintain arousal. Attention: Focused Focused Attention: Impaired Focused Attention Impairment: Verbal basic    Extremity Assessment (includes Sensation/Coordination)  Upper Extremity Assessment: RUE deficits/detail, LUE deficits/detail RUE Deficits / Details: grimacing with PROM. some active withdrawl noted. Not moving to verbal or tacile cues. Increased edema in hands. RUE Coordination: decreased fine motor, decreased gross motor LUE Deficits / Details: grimacing with PROM. some active withdrawl noted. Not moving to verbal or tacile cues. Increased edema in hands. LUE Coordination: decreased fine motor, decreased gross motor  Lower Extremity Assessment: Defer to PT evaluation RLE Deficits / Details: some voluntary  movement seen, but not consistently to command; stiffness in feet with dry skin so lotion applied LLE Deficits / Details: some voluntary  movement seen, but not consistently to command;stiffness in feet with dry skin so lotion applied    ADLs  Overall ADL's : Needs assistance/impaired Eating/Feeding: Maximal assistance Eating/Feeding Details (indicate cue type and reason): dsy 1 Grooming: Maximal assistance, Bed level Upper Body Bathing: Maximal assistance, Bed level Lower Body Bathing: Total assistance, +2 for safety/equipment, +2 for physical assistance, Bed level Upper Body Dressing : Maximal assistance, Bed level Lower Body Dressing: Total assistance, +2 for physical assistance, +2 for safety/equipment, Bed level Toilet Transfer: Total  assistance Toileting- Clothing Manipulation and Hygiene: Total assistance Functional mobility during ADLs: Maximal assistance, +2 for physical assistance, +2 for safety/equipment General ADL Comments: pt very lethargic this date with difficutly maintaining arousa. max cues to become alert with very littel active participation. Pt grimaces with PROM of BUEs and neck    Mobility  Overal bed mobility: Needs Assistance Bed Mobility: Supine to Sit Rolling: Max assist Supine to sit: Max assist, HOB elevated Sit to supine: Max assist, +2 for safety/equipment General bed mobility comments: pt requires assistance to slide legs to edge of bed although she does initiate. Pt with assist to pivot hips and to elevate trunk into sitting    Transfers  Overall transfer level: Needs assistance Equipment used: 1 person  hand held assist Transfers: Sit to/from Stand, Bed to chair/wheelchair/BSC Sit to Stand: Total assist Bed to/from chair/wheelchair/BSC transfer type:: Squat pivot Squat pivot transfers: Max assist Transfer via Lift Equipment: [place] transfer comment: pt does initiate sit to stand but unable to extend hips with PT verbal cues    Ambulation / Gait / Stairs / Wheelchair Mobility       Posture / Balance Dynamic Sitting Balance Sitting balance - Comments: minG Balance Overall balance assessment: Needs assistance Sitting-balance support: Single extremity supported, Bilateral upper extremity supported, Feet supported Sitting balance-Leahy Scale: Poor Sitting balance - Comments: minG Postural control: Posterior lean Standing balance support: Bilateral upper extremity supported Standing balance-Leahy Scale: Zero Standing balance comment: max-totalA, flexed posture, unable to extend hips completely    Special needs/care consideration Skin Blister: back, buttocks, leg/bilateral; Abrasion: buttocks, thigh/bilateral; Wound: skin tear-back/right  Bowel and Bladder incontinence, External  urinary catheter, Peg tube and Diabetic management novoLOG 4 units every 4 hours; novoLOG 0-15  units every 4 hours   Previous Home Environment (from acute therapy documentation) Living Arrangements: Other (Comment) Available Help at Discharge: Family, Available 24 hours/day Type of Home: House Home Layout: One level Home Access: Stairs to enter Entrance Stairs-Rails: None Entrance Stairs-Number of Steps: 3 Bathroom Shower/Tub: Optometrist: No Home Care Services: No Additional Comments: sister planning to provide 24/7  Discharge Living Setting Plans for Discharge Living Setting: House (daughter's house) Type of Home at Discharge: House Discharge Home Layout: One level Discharge Home Access: Stairs to enter Entrance Stairs-Rails: None Entrance Stairs-Number of Steps: 2 Discharge Bathroom Shower/Tub: Tub/shower unit Discharge Bathroom Toilet: Handicapped height Discharge Bathroom Accessibility: Yes How Accessible: Accessible via walker Does the patient have any problems obtaining your medications?: Yes (Describe) (high co-pays)  Social/Family/Support Systems Anticipated Caregiver: [name] and Judee Clara, daughters Anticipated Caregiver's Contact Information: [name]: [phone]: [phone] Caregiver Availability: 24/7 Discharge Plan Discussed with Primary Caregiver: Yes Is Caregiver In Agreement with Plan?: Yes Does Caregiver/Family have Issues with Lodging/Transportation while Pt is in Rehab?: No  Goals Patient/Family Goal for Rehab: Min A:PT/OT,Min-Mod A:ST Expected length of stay: 16-18 days Pt/Family Agrees to Admission and willing to participate: Yes Program Orientation Provided & Reviewed with Pt/Caregiver Including Roles  & Responsibilities: Yes  Decrease burden of Care through IP rehab admission: NA  Possible need for SNF placement upon discharge: Not anticipated  Patient Condition: I have  reviewed medical records from [facility], spoken with CM, and patient and daughter. I met with patient at the bedside and discussed via phone for inpatient rehabilitation assessment.  Patient will benefit from ongoing PT, OT, and SLP, can actively participate in 3 hours of therapy a day 5 days of the week, and can make measurable gains during the admission.  Patient will also benefit from the coordinated team approach during an Inpatient Acute Rehabilitation admission.  The patient will receive intensive therapy as well as Rehabilitation physician, nursing, social worker, and care management interventions.  Due to bladder management, bowel management, safety, skin/wound care, disease management, medication administration, pain management, and patient education the patient requires 24 hour a day rehabilitation nursing.  The patient is currently *** with mobility and basic ADLs.  Discharge setting and therapy post discharge at home with home health is anticipated.  Patient has agreed to participate in the Acute Inpatient Rehabilitation Program and will admit {Time; today/tomorrow:10263}.  Preadmission Screen Completed By:  Bethel Born, 03/04/2022 4:32 PM ______________________________________________________________________   Discussed  status with Dr. Marland Kitchen on *** at *** and received approval for admission today.  Admission Coordinator:  Bethel Born, CCC-SLP, time ***/Date ***   Assessment/Plan: Diagnosis: Does the need for close, 24 hr/day Medical supervision in concert with the patient's rehab needs make it unreasonable for this patient to be served in a less intensive setting? {yes_no_potentially:3041433} Co-Morbidities requiring supervision/potential complications: *** Due to {due FV:4944967}, does the patient require 24 hr/day rehab nursing? {yes_no_potentially:3041433} Does the patient require coordinated care of a physician, rehab nurse, PT, OT, and SLP to address  physical and functional deficits in the context of the above medical diagnosis(es)? {yes_no_potentially:3041433} Addressing deficits in the following areas: {deficits:3041436} Can the patient actively participate in an intensive therapy program of at least 3 hrs of therapy 5 days a week? {yes_no_potentially:3041433} The potential for patient to make measurable gains while on inpatient rehab is {potential:3041437} Anticipated functional outcomes upon discharge from inpatient rehab: {functional outcomes:304600100} PT, {functional outcomes:304600100} OT, {functional outcomes:304600100} SLP Estimated rehab length of stay to reach the above functional goals is: *** Anticipated discharge destination: {anticipated dc setting:21604} 10. Overall Rehab/Functional Prognosis: {potential:3041437}   MD Signature: ***

## 2022-03-02 NOTE — Progress Notes (Signed)
PROGRESS NOTE  Cindy Brooks  NFA:[id number] DOB: 11/20/1970 DOA: 01/20/2022 PCP: Pcp, No   Brief Narrative:  Patient is a 51 year old female with history of uncontrolled hypertension, history of TIA who presented to the [facility] emergency department on 4/9 after being found unresponsive by his family.  Patient was found to be obtunded with copious secretions, subsequently intubated on 4/9 for airway protection.  Hospital course remarkable for uncontrolled hypertension, work-up also showed thalamic stroke.  Wernicke's encephalopathy also suspected.  Transferred to [facility] service.    PCCM was following ,she has been de-cannulated on 5/19.  Pending placement.  Important events:  4/9 Admit  4/12 ongoing high dose thiamine for possible Wernike's encephalopathy  4/13 IV sedation stopped, phenobarbital wean 4/17 still not waking up. Trach placed.  4/18 PEG placed. Spiking fever. Lactate spiked. Got fluid. Cleared.  4/22 Tolerated 6hrs of ATC day prior. No major events overnight, appears that stroke had [place] meeting with family 4/21 with no decision made 4/23 No issues overnight, on vent this am, remains unresponsive  4/27 changed to propranolol from metoprolol for neuro-storming. Started on vanc and cefepime for possible HCAP  4/28 trial on open ended trach collar 5/2 patient transferred to Hospitalist Service.  5/14 waking up and following simple commands. 5/15 downsized tracheostomy to #4 5/16 tracheostomy capping trials. 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  Multifactorial: Thalamic stroke, Warnicke's encephalopathy.  Monitor mental status. MRI brain on 4/11 showed patchy restricted diffusion on both thalami and midbrain.CT angiogram head and neck 4/11, severe bilateral P1 and right P2 PCA stenosis, bilateral ophthalmic infarct, severe left proximal M2 MCA branch stenosis, severe right A2 ACA branch stenosis, no evidence of dural venous thrombosis. Warnicke's encephalopathy was suspected and he was given high-dose thiamine.  Continue oral thiamine.  Patient was also on phenobarbital which was weaned off. Status post trach, PEG placement.  Remains on amantadine, gabapentin.  Acute hypoxic respiratory failure: Intubated on presentation for airway protection.  Prolonged ventilator use.  Currently status post tracheostomy.  Currently on trach collar.  Hospital course also remarkable for healthcare associated pneumonia, treated with antibiotics, completed the course.  Repeat chest x-ray showed chronic atelectasis.  No evidence of new findings.  PCCM were following ,decannulated on 5/19  Thalamic stroke: MRI as above.  Positive anticardiolipin IgM, lupus anticoagulant: Neurology was following and recommended Plavix.  Dysphagia: Status post PEG.  Continue tube feeding.  Speech therapy closely following, recommending dysphagia 1 diet  Hypertension: Currently blood pressure stable.  On amlodipine, propanolol, hydralazine  History of hyperlipidemia: On Crestor  Obesity: BMI of 30.3  Debility: Patient seen by PT and OT and recommended CIR.  Likely the discharge plan will be to SNF        Nutrition Problem: Inadequate oral intake Etiology: inability to eat     DVT prophylaxis:SCDs Start: 01/20/22 1012     Code Status: Full Code  Family Communication: None at bedside  Patient status:Inpatient  Patient is from :Home  Anticipated discharge to:SnF/CIR  Estimated DC  date:Not sure  Consultants: PCCM  Procedures: Intubation, extubation, trach, PEG  Antimicrobials:  Anti-infectives (From admission, onward)    Start     Dose/Rate Route Frequency Ordered Stop   02/08/22 1800  vancomycin (VANCOREADY) IVPB 1500 mg/300 mL  Status:  Discontinued        1,500 mg 150 mL/hr over 120 Minutes Intravenous Every 24 hours 02/07/22 1712 02/09/22 1046   02/07/22 1800  vancomycin (VANCOREADY) IVPB 1750 mg/350 mL        1,750 mg 175 mL/hr over 120 Minutes Intravenous  Once 02/07/22 1708 02/07/22 2046   02/07/22 1700  ceFEPIme (MAXIPIME) 2 g in sodium chloride 0.9 % 100 mL IVPB        2 g 200 mL/hr over 30 Minutes Intravenous Every 8 hours 02/07/22 1706 02/14/22 0856   01/20/22 2000  cefTRIAXone (ROCEPHIN) 2 g in sodium chloride 0.9 % 100 mL IVPB        2 g 200 mL/hr over 30 Minutes Intravenous Every 24 hours 01/20/22 0634 01/24/22 2022   01/20/22 0415  cefTRIAXone (ROCEPHIN) 2 g in sodium chloride 0.9 % 100 mL IVPB        2 g 200 mL/hr over 30 Minutes Intravenous  Once 01/20/22 0414 01/20/22 0537       Subjective:  Patient seen and examined the bedside this morning.  Hemodynamically stable.  Lying in bed.  On room air after decannulation.  Comfortable    Objective: Vitals:   03/02/22 0315 03/02/22 0451 03/02/22 0551 03/02/22 0801  BP: 135/82  128/83 123/88  Pulse: 86   99  Resp: (!) 24   (!) 22  Temp:    99.6 F (37.6 C)  TempSrc:    Oral  SpO2: 97%   100%  Weight:  79.8 kg    Height:        Intake/Output Summary (Last 24 hours) at 03/02/2022 1101 Last data filed at 03/02/2022 0352 Gross per 24 hour  Intake 1400 ml  Output 600 ml  Net 800 ml   Filed Weights   02/27/22 0500 03/01/22 0500 03/02/22 0451  Weight: 80.3 kg 79.7 kg 79.8 kg    Examination:   General exam: Overall comfortable, not in distress, very deconditioned HEENT: Left eye closed Respiratory system:  no wheezes or crackles  Cardiovascular system: S1 & S2 heard, RRR.   Gastrointestinal system: Abdomen is nondistended, soft and nontender.  PEG Central nervous system: Alert and awake, aphasic, generalized weakness Extremities: Edema of hands, no clubbing ,no cyanosis Skin: No rashes, no ulcers,no icterus     Data Reviewed: I have personally reviewed following labs and imaging studies  CBC: Recent Labs  Lab 02/25/22 0212  WBC 7.2  NEUTROABS 4.7  HGB 11.2*  HCT 34.0*  MCV 81.5  PLT 932   Basic Metabolic Panel: Recent Labs  Lab 02/25/22 0212  NA 137  K 4.2  CL 99  CO2 28  GLUCOSE 113*  BUN 14  CREATININE 0.54  CALCIUM 9.4  MG 2.1  PHOS 4.0     Recent Results (from the past 240 hour(s))  Culture, blood (Routine X 2) w Reflex to ID Panel     Status: None   Collection Time: 02/20/22 11:03 AM   Specimen: BLOOD LEFT WRIST  Result Value Ref Range Status   Specimen Description BLOOD LEFT WRIST  Final   Special Requests   Final    BOTTLES DRAWN AEROBIC AND ANAEROBIC Blood Culture results may not be optimal due to an inadequate volume  of blood received in culture bottles   Culture   Final    NO GROWTH 5 DAYS Performed at [facility], [place] [address], [place], [place] [address]    Report Status 02/25/2022 FINAL  Final  Culture, blood (Routine X 2) w Reflex to ID Panel     Status: Abnormal   Collection Time: 02/20/22 11:04 AM   Specimen: BLOOD LEFT HAND  Result Value Ref Range Status   Specimen Description BLOOD LEFT HAND  Final   Special Requests   Final    BOTTLES DRAWN AEROBIC AND ANAEROBIC Blood Culture results may not be optimal due to an inadequate volume of blood received in culture bottles   Culture  Setup Time   Final    GRAM POSITIVE COCCI IN BOTH AEROBIC AND ANAEROBIC BOTTLES Organism ID to follow CRITICAL RESULT CALLED TO, READ BACK BY AND VERIFIED WITH: J. FRENS PHARMD, AT 0258 02/21/22 D. VANHOOK    Culture (A)  Final    STAPHYLOCOCCUS EPIDERMIDIS THE SIGNIFICANCE OF ISOLATING THIS ORGANISM FROM A SINGLE SET OF  BLOOD CULTURES WHEN MULTIPLE SETS ARE DRAWN IS UNCERTAIN. PLEASE NOTIFY THE MICROBIOLOGY DEPARTMENT WITHIN ONE WEEK IF SPECIATION AND SENSITIVITIES ARE REQUIRED. Performed at [facility], [place] [address], [place], [place] [address]    Report Status 02/23/2022 FINAL  Final  Blood Culture ID Panel (Reflexed)     Status: Abnormal   Collection Time: 02/20/22 11:04 AM  Result Value Ref Range Status   Enterococcus faecalis NOT DETECTED NOT DETECTED Final   Enterococcus Faecium NOT DETECTED NOT DETECTED Final   Listeria monocytogenes NOT DETECTED NOT DETECTED Final   Staphylococcus species DETECTED (A) NOT DETECTED Final    Comment: CRITICAL RESULT CALLED TO, READ BACK BY AND VERIFIED WITH: J. FRENS PHARMD, AT 2423 02/21/22 D. VANHOOK    Staphylococcus aureus (BCID) NOT DETECTED NOT DETECTED Final   Staphylococcus epidermidis DETECTED (A) NOT DETECTED Final    Comment: Methicillin (oxacillin) resistant coagulase negative staphylococcus. Possible blood culture contaminant (unless isolated from more than one blood culture draw or clinical case suggests pathogenicity). No antibiotic treatment is indicated for blood  culture contaminants. CRITICAL RESULT CALLED TO, READ BACK BY AND VERIFIED WITH: J. FRENS PHARMD, AT 5361 02/21/22 D. VANHOOK    Staphylococcus lugdunensis NOT DETECTED NOT DETECTED Final   Streptococcus species NOT DETECTED NOT DETECTED Final   Streptococcus agalactiae NOT DETECTED NOT DETECTED Final   Streptococcus pneumoniae NOT DETECTED NOT DETECTED Final   Streptococcus pyogenes NOT DETECTED NOT DETECTED Final   A.calcoaceticus-baumannii NOT DETECTED NOT DETECTED Final   Bacteroides fragilis NOT DETECTED NOT DETECTED Final   Enterobacterales NOT DETECTED NOT DETECTED Final   Enterobacter cloacae complex NOT DETECTED NOT DETECTED Final   Escherichia coli NOT DETECTED NOT DETECTED Final   Klebsiella aerogenes NOT DETECTED NOT DETECTED Final   Klebsiella oxytoca NOT DETECTED  NOT DETECTED Final   Klebsiella pneumoniae NOT DETECTED NOT DETECTED Final   Proteus species NOT DETECTED NOT DETECTED Final   Salmonella species NOT DETECTED NOT DETECTED Final   Serratia marcescens NOT DETECTED NOT DETECTED Final   Haemophilus influenzae NOT DETECTED NOT DETECTED Final   Neisseria meningitidis NOT DETECTED NOT DETECTED Final   Pseudomonas aeruginosa NOT DETECTED NOT DETECTED Final   Stenotrophomonas maltophilia NOT DETECTED NOT DETECTED Final   Candida albicans NOT DETECTED NOT DETECTED Final   Candida auris NOT DETECTED NOT DETECTED Final   Candida glabrata NOT DETECTED NOT DETECTED Final  Candida krusei NOT DETECTED NOT DETECTED Final   Candida parapsilosis NOT DETECTED NOT DETECTED Final   Candida tropicalis NOT DETECTED NOT DETECTED Final   Cryptococcus neoformans/gattii NOT DETECTED NOT DETECTED Final   Methicillin resistance mecA/C DETECTED (A) NOT DETECTED Final    Comment: CRITICAL RESULT CALLED TO, READ BACK BY AND VERIFIED WITH: J. FRENS PHARMD, AT 1610 02/21/22 D. VANHOOK Performed at [facility], [place] [address] [place], [place] [address]      Radiology Studies: DG Swallowing Func-Speech Pathology  Result Date: 02/28/2022 Table formatting from the original result was not included. Objective Swallowing Evaluation: Type of Study: MBS-Modified Barium Swallow Study  Patient Details Name: Cindy Brooks MRN: [id number] Date of Birth: 28-Jan-1971 Today's Date: 02/28/2022 Time: SLP Start Time (ACUTE ONLY): 7829 -SLP Stop Time (ACUTE ONLY): 5621 SLP Time Calculation (min) (ACUTE ONLY): 30 min Past Medical History: Past Medical History: Diagnosis Date  Hypertension   Hypertensive urgency 01/20/2022  Seizure ([place]) 01/28/2022  TIA (transient ischemic attack) 2019  Urinary tract infection 01/28/2022 Past Surgical History: Past Surgical History: Procedure Laterality Date  ESOPHAGOGASTRODUODENOSCOPY N/A 01/28/2022  Procedure: ESOPHAGOGASTRODUODENOSCOPY (EGD);  Surgeon:  Jesusita Oka, MD;  Location: [facility] ENDOSCOPY;  Service: General;  Laterality: N/A;  PEG PLACEMENT N/A 01/28/2022  Procedure: PERCUTANEOUS ENDOSCOPIC GASTROSTOMY (PEG) PLACEMENT;  Surgeon: Jesusita Oka, MD;  Location: [facility] ENDOSCOPY;  Service: General;  Laterality: N/A; HPI: Pt is a 51 y/o female who presented to the ED after being found unresponsive in her care. Pt admitted 4/9 with thalamic stroke, treated for possible acute metabolic and wernicke's encphalopathy as well. She required intubation for airway protection on admission and had a tracheostomy placed on 4/16.  PEG on 4/18. MRI 4/19: Multiple foci of restricted diffusion in the bilateral frontal lobes and parietal lobes primarily. Started tolerating ATC on 4/22. Has suffered from severe encephalopathy, neurostorming. Started following commands around 5/15. A time of evaluation, pt with a 4 cuffless trach which is capped and may be decannulated within the next 24-48 hours. PMH: HTN, TIA, ETOH abuse and medication noncompliance.  No data recorded  Recommendations for follow up therapy are one component of a multi-disciplinary discharge planning process, led by the attending physician.  Recommendations may be updated based on patient status, additional functional criteria and insurance authorization. Assessment / Plan / Recommendation   02/28/2022  12:38 PM Clinical Impressions Clinical Impression Pt was lethargic throughout the study despite sternal rub and thermotactile stimulation with cold washcloth, and pt exhibited difficulty maintaining an adequate level of alertness once improved alertness was achieved. SLP suspects that this, combined with the flavor the barium, likely impacted pt's performance during the study. However, the study was somewhat limited due to pt's lethargy. She exhibited worse bolus awareness than has been exhibited at bedside, and significant oral holding was noted with purees and thin liquids. Oral suctioning was necessary once due  to the extent of oral holding. Minimal premature spillage to the valleculae was noted with a thin liquid bolus via straw; however, pt was otherwise able to contain large liquid boluses in the oral cavity with no instances of premature spillage even when a posterior head tilt was facilitated. With verbal and tactile prompts and cues, once the swallow was triggered, pharyngeal clearance was WNL and no instances of penetration/aspiration were demonstrated. Considering pt's current lethargy, diet initiation will be deferred today. However, once pt's presentation is more similar to that noted this morning in treatment, a dysphagia 1 diet with thin liquids  will likely be initiated. SLP Visit Diagnosis Dysphagia, oral phase (R13.11) Impact on safety and function Mild aspiration risk     02/28/2022  12:38 PM Treatment Recommendations Treatment Recommendations Therapy as outlined in treatment plan below     02/28/2022  12:38 PM Prognosis Prognosis for Safe Diet Advancement Good Barriers to Reach Goals Cognitive deficits;Language deficits;Time post onset   02/28/2022  12:38 PM Diet Recommendations SLP Diet Recommendations Dysphagia 1 (Puree) solids;Thin liquid Liquid Administration via Cup;Straw Medication Administration Via alternative means Compensations Slow rate;Small sips/bites;Minimize environmental distractions Postural Changes Seated upright at 90 degrees     02/28/2022  12:38 PM Other Recommendations Oral Care Recommendations Oral care BID Follow Up Recommendations Skilled nursing-short term rehab (<3 hours/day) Assistance recommended at discharge Frequent or constant Supervision/Assistance Functional Status Assessment Patient has had a recent decline in their functional status and demonstrates the ability to make significant improvements in function in a reasonable and predictable amount of time.   02/28/2022  12:38 PM Frequency and Duration  Speech Therapy Frequency (ACUTE ONLY) min 2x/week Treatment Duration 2 weeks      02/28/2022  12:38 PM Oral Phase Oral Phase Impaired Oral - Thin Cup Delayed oral transit;Holding of bolus Oral - Thin Straw Delayed oral transit;Holding of bolus Oral - Puree Delayed oral transit;Holding of bolus    02/28/2022  12:38 PM Pharyngeal Phase Pharyngeal Phase [facility]    02/28/2022  12:38 PM Cervical Esophageal Phase  Cervical Esophageal Phase [facility] Shanika I. Hardin Negus, [place], [place] Office number [phone] Pager [place] 02/28/2022, 1:51 PM                      Scheduled Meds:  amantadine  100 mg Per Tube BID   amLODipine  10 mg Per Tube Daily   chlorhexidine gluconate (MEDLINE KIT)  15 mL Mouth Rinse BID   Chlorhexidine Gluconate Cloth  6 each Topical Daily   clopidogrel  75 mg Per Tube Daily   docusate  100 mg Per Tube BID   feeding supplement (PROSource TF)  45 mL Per Tube Daily   folic acid  1 mg Per Tube Daily   free water  250 mL Per Tube Q6H   gabapentin  300 mg Per Tube BID   glycopyrrolate  1 mg Per Tube TID   guaiFENesin  10 mL Per Tube Q6H   hydrALAZINE  25 mg Per Tube Q8H   insulin aspart  0-15 Units Subcutaneous Q4H   insulin aspart  4 Units Subcutaneous Q4H   mouth rinse  15 mL Mouth Rinse 10 times per day   multivitamin with minerals  1 tablet Per Tube Daily   propranolol  40 mg Per Tube TID   rosuvastatin  40 mg Per Tube Daily   senna  1 tablet Per Tube Daily   thiamine  100 mg Per Tube Daily   Continuous Infusions:  sodium chloride Stopped (02/11/22 0928)   feeding supplement (JEVITY 1.5 CAL/FIBER) 1,000 mL (03/02/22 0550)     LOS: 41 days   Shelly Coss, MD [facility] P5/20/2023, 11:01 AM

## 2022-03-03 LAB — GLUCOSE, CAPILLARY
Glucose-Capillary: 118 mg/dL — ABNORMAL HIGH (ref 70–99)
Glucose-Capillary: 124 mg/dL — ABNORMAL HIGH (ref 70–99)
Glucose-Capillary: 131 mg/dL — ABNORMAL HIGH (ref 70–99)
Glucose-Capillary: 132 mg/dL — ABNORMAL HIGH (ref 70–99)
Glucose-Capillary: 149 mg/dL — ABNORMAL HIGH (ref 70–99)
Glucose-Capillary: 158 mg/dL — ABNORMAL HIGH (ref 70–99)
Glucose-Capillary: 171 mg/dL — ABNORMAL HIGH (ref 70–99)

## 2022-03-03 MED ORDER — GABAPENTIN 250 MG/5ML PO SOLN
100.0000 mg | Freq: Two times a day (BID) | ORAL | Status: DC
Start: 2022-03-03 — End: 2022-03-18
  Administered 2022-03-03 – 2022-03-17 (×29): 100 mg
  Filled 2022-03-03 (×30): qty 2

## 2022-03-03 MED ORDER — GABAPENTIN 100 MG PO CAPS: 100.0000 mg | ORAL_CAPSULE | Freq: Two times a day (BID) | ORAL | Status: DC

## 2022-03-03 MED ORDER — GLYCOPYRROLATE 1 MG PO TABS
1.0000 mg | ORAL_TABLET | Freq: Two times a day (BID) | ORAL | Status: DC
Start: 2022-03-03 — End: 2022-03-18
  Administered 2022-03-03 – 2022-03-17 (×29): 1 mg
  Filled 2022-03-03 (×30): qty 1

## 2022-03-03 NOTE — Progress Notes (Signed)
PROGRESS NOTE  [facility]  NFA:[id number] DOB: 11/20/1970 DOA: 01/20/2022 PCP: Pcp, No   Brief Narrative:  Patient is a 51 year old female with history of uncontrolled hypertension, history of TIA who presented to the [facility] emergency department on 4/9 after being found unresponsive by his family.  Patient was found to be obtunded with copious secretions, subsequently intubated on 4/9 for airway protection.  Hospital course remarkable for uncontrolled hypertension, work-up also showed thalamic stroke.  Wernicke's encephalopathy also suspected.  Transferred to [facility].    [facility] was following ,she has been de-cannulated on 5/19.  Pending placement.  Important events:  4/9 Admit  4/12 ongoing high dose thiamine for possible Wernike's encephalopathy  4/13 IV sedation stopped, phenobarbital wean 4/17 still not waking up. Trach placed.  4/18 PEG placed. Spiking fever. Lactate spiked. Got fluid. Cleared.  4/22 Tolerated 6hrs of ATC day prior. No major events overnight, appears that stroke had [facility] meeting with family 4/21 with no decision made 4/23 No issues overnight, on vent this am, remains unresponsive  4/27 changed to propranolol from metoprolol for neuro-storming. Started on vanc and cefepime for possible HCAP  4/28 trial on open ended trach collar 5/2 patient transferred to [facility].  5/14 waking up and following simple commands. 5/15 downsized tracheostomy to #4 5/16 tracheostomy capping trials. 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  Multifactorial: Thalamic stroke, Warnicke's encephalopathy.  Monitor mental status. MRI brain on 4/11 showed patchy restricted diffusion on both thalami and midbrain.CT angiogram head and neck 4/11, severe bilateral P1 and right P2 PCA stenosis, bilateral ophthalmic infarct, severe left proximal M2 MCA branch stenosis, severe right A2 ACA branch stenosis, no evidence of dural venous thrombosis. Warnicke's encephalopathy was suspected and he was given high-dose thiamine.  Continue oral thiamine.  Patient was also on phenobarbital which was weaned off. Status post trach, PEG placement.  Remains on amantadine, gabapentin.  Acute hypoxic respiratory failure: Intubated on presentation for airway protection.  Prolonged ventilator use.  Currently status post tracheostomy.  Currently on trach collar.  Hospital course also remarkable for healthcare associated pneumonia, treated with antibiotics, completed the course.  Repeat chest x-ray showed chronic atelectasis.  No evidence of new findings.  [facility] were following ,decannulated on 5/19  Thalamic stroke: MRI as above.  Positive anticardiolipin IgM, lupus anticoagulant: Neurology was following and recommended Plavix.  Dysphagia: Status post PEG.  Continue tube feeding.  Speech therapy closely following, recommending dysphagia 1 diet  Hypertension: Currently blood pressure stable.  On amlodipine, propanolol, hydralazine  History of hyperlipidemia: On [facility]  Obesity: BMI of 30.3  Debility: Patient seen by [facility] and [facility] and recommended [facility].  Likely the discharge plan will be to [facility]        Nutrition Problem: Inadequate oral intake Etiology: inability to eat     DVT prophylaxis:SCDs Start: 01/20/22 1012     Code Status: Full Code  Family Communication: None at bedside  Patient status:Inpatient  Patient is from :Home  Anticipated discharge to:[facility]  Estimated DC  date:Not sure  Consultants: [facility]  Procedures: Intubation, extubation, trach, PEG  Antimicrobials:  Anti-infectives (From admission, onward)    Start     Dose/Rate Route Frequency Ordered Stop   02/08/22 1800  vancomycin (VANCOREADY) IVPB 1500 mg/300 mL  Status:  Discontinued        1,500 mg 150 mL/hr over 120 Minutes Intravenous Every 24 hours 02/07/22 1712 02/09/22 1046   02/07/22 1800  vancomycin (VANCOREADY) IVPB 1750 mg/350 mL        1,750 mg 175 mL/hr over 120 Minutes Intravenous  Once 02/07/22 1708 02/07/22 2046   02/07/22 1700  ceFEPIme (MAXIPIME) 2 g in sodium chloride 0.9 % 100 mL IVPB        2 g 200 mL/hr over 30 Minutes Intravenous Every 8 hours 02/07/22 1706 02/14/22 0856   01/20/22 2000  cefTRIAXone (ROCEPHIN) 2 g in sodium chloride 0.9 % 100 mL IVPB        2 g 200 mL/hr over 30 Minutes Intravenous Every 24 hours 01/20/22 0634 01/24/22 2022   01/20/22 0415  cefTRIAXone (ROCEPHIN) 2 g in sodium chloride 0.9 % 100 mL IVPB        2 g 200 mL/hr over 30 Minutes Intravenous  Once 01/20/22 0414 01/20/22 0537       Subjective:  Patient seen and examined at the bedside this morning.  Hemodynamically stable.  Remains on room air after decannulation.  Appears sleepy, did not wake up on  calling her name   Objective: Vitals:   03/03/22 0340 03/03/22 0500 03/03/22 0513 03/03/22 0801  BP: 121/85  124/88 134/90  Pulse: 89   93  Resp: 20   (!) 24  Temp: 99.6 F (37.6 C)   98.7 F (37.1 C)  TempSrc: Oral   Oral  SpO2: 95%   95%  Weight:  79.8 kg    Height:        Intake/Output Summary (Last 24 hours) at 03/03/2022 1053 Last data filed at 03/02/2022 1800 Gross per 24 hour  Intake --  Output 300 ml  Net -300 ml   Filed Weights   03/01/22 0500 03/02/22 0451 03/03/22 0500  Weight: 79.7 kg 79.8 kg 79.8 kg    Examination:  General exam: Overall comfortable, not in distress, very deconditioned HEENT: Left eye closed Respiratory system:  no wheezes or crackles   Cardiovascular system: S1 & S2 heard, RRR.  Gastrointestinal system: Abdomen is nondistended, soft and nontender.peg Central nervous system: Not alert or oriented, aphasic, generalized weakness Extremities:  edema of bilateral hands, no clubbing ,no cyanosis Skin: No rashes, no ulcers,no icterus    Data Reviewed: I have personally reviewed following labs and imaging studies  CBC: Recent Labs  Lab 02/25/22 0212  WBC 7.2  NEUTROABS 4.7  HGB 11.2*  HCT 34.0*  MCV 81.5  PLT 161   Basic Metabolic Panel: Recent Labs  Lab 02/25/22 0212  NA 137  K 4.2  CL 99  CO2 28  GLUCOSE 113*  BUN 14  CREATININE 0.54  CALCIUM 9.4  MG 2.1  PHOS 4.0     No results found for this or any previous visit (from the past 240 hour(s)).    Radiology Studies: No results found.  Scheduled Meds:  amantadine  100 mg Per Tube BID   amLODipine  10 mg Per Tube Daily   chlorhexidine gluconate (MEDLINE KIT)  15 mL Mouth Rinse BID   Chlorhexidine Gluconate Cloth  6 each Topical Daily   clopidogrel  75 mg Per Tube  Daily   docusate  100 mg Per Tube BID   feeding supplement (PROSource TF)  45 mL Per Tube Daily   folic acid  1 mg Per Tube Daily   free water  250 mL Per Tube Q6H   gabapentin  300 mg Per Tube BID   glycopyrrolate  1 mg Per Tube TID   guaiFENesin  10 mL Per Tube Q6H   hydrALAZINE  25 mg Per Tube Q8H   insulin aspart  0-15 Units Subcutaneous Q4H   insulin aspart  4 Units Subcutaneous Q4H   mouth rinse  15 mL Mouth Rinse 10 times per day   multivitamin with minerals  1 tablet Per Tube Daily   propranolol  40 mg Per Tube TID   rosuvastatin  40 mg Per Tube Daily   senna  1 tablet Per Tube Daily   thiamine  100 mg Per Tube Daily   Continuous Infusions:  sodium chloride Stopped (02/11/22 0928)   feeding supplement (JEVITY 1.5 CAL/FIBER) 1,000 mL (03/03/22 0134)     LOS: 42 days   Shelly Coss, MD [facility] P5/21/2023, 10:53 AM

## 2022-03-03 NOTE — Evaluation (Signed)
Occupational Therapy Evaluation Patient Details Name: [facility] MRN: [id number] DOB: 1971/04/07 Today's Date: 03/03/2022   History of Present Illness Pt is a 51 y.o. female with history of HTN, TIA, ETOH abuse and medication noncompliance presenting to the ED after she was found by her family to be unresponsive in her car on 4/8. 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 Intubated 4/9. Tracheostormy 4/16.  Trach capped 5/16.   Clinical Impression   [facility] was evaluated s/p the above admission list. PLOF and home set up are unknown, however pt's sister plans to provide 24/7 assist at d/c. Upon arrival pt was lethargic and difficult to rouse, and maintained low LOA throughout session despite max cues. Eval at bed level for safety. Pt requires max A for rolling, and up to total A for LB at bed level this date. PROM of BUE and neck completed, some time pain noted with head turns and BUE movement. Pt actively withdrawing from some movements, but to command following to participate with ROM. Pt will benefit from OT fro further evaluate. Recommend AIR for maximal functional recovery.      Recommendations for follow up therapy are one component of a multi-disciplinary discharge planning process, led by the attending physician.  Recommendations may be updated based on patient status, additional functional criteria and insurance authorization.   Follow Up Recommendations  Acute inpatient rehab (3hours/day)    Assistance Recommended at Discharge Frequent or constant Supervision/Assistance  Patient can return home with the following Two people to help with walking and/or transfers;Two people to help with bathing/dressing/bathroom;Assistance with cooking/housework;Assistance with feeding;Direct supervision/assist for medications  management;Direct supervision/assist for financial management;Assist for transportation;Help with stairs or ramp for entrance    Functional Status Assessment  Patient has had a recent decline in their functional status and demonstrates the ability to make significant improvements in function in a reasonable and predictable amount of time.  Equipment Recommendations  Other (comment) (defer, pending progression)    Recommendations for Other Services Rehab consult     Precautions / Restrictions Precautions Precautions: Fall Restrictions Weight Bearing Restrictions: No      Mobility Bed Mobility Overal bed mobility: Needs Assistance   Rolling: Max assist         General bed mobility comments: unable to progress to EOB due to LOA    Transfers                   General transfer comment: unable to attempt due to LOA      Balance                                           ADL either performed or assessed with clinical judgement   ADL Overall ADL's : Needs assistance/impaired Eating/Feeding: Maximal assistance Eating/Feeding Details (indicate cue type and reason): dsy 1 Grooming: Maximal assistance;Bed level   Upper Body Bathing: Maximal assistance;Bed level   Lower Body Bathing: Total assistance;+2 for safety/equipment;+2 for physical assistance;Bed level   Upper Body Dressing : Maximal assistance;Bed level   Lower Body Dressing: Total assistance;+2 for physical assistance;+2 for safety/equipment;Bed level   Toilet Transfer: Total assistance   Toileting- Clothing Manipulation and Hygiene: Total assistance       Functional mobility during ADLs: Maximal assistance;+2 for physical  assistance;+2 for safety/equipment General ADL Comments: pt very lethargic this date with difficutly maintaining arousa. max cues to become alert with very littel active participation. Pt grimaces with PROM of BUEs and neck     Vision Patient Visual Report: No  change from baseline Vision Assessment?: Vision impaired- to be further tested in functional context Additional Comments: unable to assess as pt had eyes closed 75% of the session            Pertinent Vitals/Pain Pain Assessment Pain Assessment: Faces Faces Pain Scale: Hurts a little bit Pain Location: grimacing with BUE PROM and cervical PROM Pain Descriptors / Indicators: Grimacing Pain Intervention(s): Limited activity within patient's tolerance, Monitored during session     Hand Dominance Right   Extremity/Trunk Assessment Upper Extremity Assessment Upper Extremity Assessment: RUE deficits/detail;LUE deficits/detail RUE Deficits / Details: grimacing with PROM. some active withdrawl noted. Not moving to verbal or tacile cues. Increased edema in hands. RUE Coordination: decreased fine motor;decreased gross motor LUE Deficits / Details: grimacing with PROM. some active withdrawl noted. Not moving to verbal or tacile cues. Increased edema in hands. LUE Coordination: decreased fine motor;decreased gross motor   Lower Extremity Assessment Lower Extremity Assessment: Defer to PT evaluation   Cervical / Trunk Assessment Cervical / Trunk Assessment: Other exceptions Cervical / Trunk Exceptions: limited PROM of cervical region   Communication     Cognition Arousal/Alertness: Lethargic Behavior During Therapy: Flat affect Overall Cognitive Status: Difficult to assess                                 General Comments: very lethargic this day, difficult to maintain arousal.     General Comments  VSS on RA     Home Living Family/patient expects to be discharged to:: Private residence Living Arrangements: Other (Comment) Available Help at Discharge: Family;Available 24 hours/day Type of Home: House Home Access: Stairs to enter [employer] of Steps: 3 Entrance Stairs-Rails: None Home Layout: One level     Bathroom Shower/Tub: Animal nutritionist: Standard Bathroom Accessibility: No       Additional Comments: sister planning to provide 24/7              OT Problem List: Decreased strength;Decreased range of motion;Decreased activity tolerance;Impaired balance (sitting and/or standing);Decreased coordination;Decreased cognition;Decreased safety awareness;Decreased knowledge of use of DME or AE;Decreased knowledge of precautions;Impaired UE functional use;Pain;Increased edema      OT Treatment/Interventions: Self-care/ADL training;Therapeutic exercise;Neuromuscular education;DME and/or AE instruction;Patient/family education;Balance training    OT Goals(Current goals can be found in the care plan section) Acute Rehab OT Goals Patient Stated Goal: unable to state OT Goal Formulation: Patient unable to participate in goal setting Time For Goal Achievement: 03/17/22 Potential to Achieve Goals: Good ADL Goals Pt Will Perform Grooming: with mod assist;sitting Pt Will Perform Upper Body Bathing: with mod assist;sitting Pt Will Transfer to Toilet: with max assist;stand pivot transfer;bedside commode Additional ADL Goal #1: Pt will complete bed mobility with mod A as a precursor to ADLs Additional ADL Goal #2: Pt will follow follow simple one step commands 100% of the session to assist in ADLs  OT Frequency: Min 2X/week       AM-PAC OT "6 Clicks" Daily Activity     Outcome Measure Help from another person eating meals?: A Lot Help from another person taking care of personal grooming?: A Lot Help from another person toileting, which includes  using toliet, bedpan, or urinal?: Total Help from another person bathing (including washing, rinsing, drying)?: Total Help from another person to put on and taking off regular upper body clothing?: A Lot Help from another person to put on and taking off regular lower body clothing?: Total 6 Click Score: 9   End of Session Nurse Communication: Mobility status  Activity  Tolerance: Patient limited by lethargy Patient left: in bed;with call bell/phone within reach;with bed alarm set  OT Visit Diagnosis: Unsteadiness on feet (R26.81);Other abnormalities of gait and mobility (R26.89);Muscle weakness (generalized) (M62.81);Pain                Time: [id number] OT Time Calculation (min): 15 min Charges:  OT General Charges $OT Visit: 1 Visit OT Evaluation $OT Eval Moderate Complexity: 1 Mod   Rulon Abdalla A Maleiya Pergola 03/03/2022, 1:06 PM

## 2022-03-04 DIAGNOSIS — Z931 Gastrostomy status: Secondary | ICD-10-CM | POA: Diagnosis not present

## 2022-03-04 DIAGNOSIS — Z515 Encounter for palliative care: Secondary | ICD-10-CM

## 2022-03-04 DIAGNOSIS — I6381 Other cerebral infarction due to occlusion or stenosis of small artery: Secondary | ICD-10-CM | POA: Diagnosis not present

## 2022-03-04 DIAGNOSIS — Z9889 Other specified postprocedural states: Secondary | ICD-10-CM

## 2022-03-04 LAB — GLUCOSE, CAPILLARY
Glucose-Capillary: 156 mg/dL — ABNORMAL HIGH (ref 70–99)
Glucose-Capillary: 113 mg/dL — ABNORMAL HIGH (ref 70–99)
Glucose-Capillary: 143 mg/dL — ABNORMAL HIGH (ref 70–99)
Glucose-Capillary: 91 mg/dL (ref 70–99)
Glucose-Capillary: 135 mg/dL — ABNORMAL HIGH (ref 70–99)
Glucose-Capillary: 151 mg/dL — ABNORMAL HIGH (ref 70–99)

## 2022-03-04 NOTE — Progress Notes (Signed)
Physical Therapy Treatment Patient Details Name: [facility] MRN: [id number] DOB: May 06, 1971 Today's Date: 03/04/2022   History of Present Illness Pt is a 51 y.o. female with history of HTN, TIA, ETOH abuse and medication noncompliance presenting to the ED after she was found by her family to be unresponsive in her car on 4/8. 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 Intubated 4/9. Tracheostormy 4/16.  Trach capped 5/16.    PT Comments    Pt tolerates treatment well although consistently grimacing with AROM or PROM of bilateral knee flexion and extension. Both knees are stiff at this time, with pain limiting mobility. PT encourages attempts at AROM of BLE, pt's son present during session and observing LE HEP. Pt continues to require significant assistance for all functional mobility tasks but does continue to show small improvements in static sitting balance. Pt will need to continue to show improvement in activity tolerance to be an ideal AIR candidate.  Recommendations for follow up therapy are one component of a multi-disciplinary discharge planning process, led by the attending physician.  Recommendations may be updated based on patient status, additional functional criteria and insurance authorization.  Follow Up Recommendations  Acute inpatient rehab (3hours/day)     Assistance Recommended at Discharge Frequent or constant Supervision/Assistance  Patient can return home with the following Two people to help with walking and/or transfers;Two people to help with bathing/dressing/bathroom   Equipment Recommendations  Hospital bed;Wheelchair (measurements PT);Wheelchair cushion (measurements PT);Other (comment)    Recommendations for Other Services       Precautions / Restrictions Precautions Precautions:  Fall Precaution Comments: PEG, stoma Restrictions Weight Bearing Restrictions: No     Mobility  Bed Mobility Overal bed mobility: Needs Assistance Bed Mobility: Supine to Sit     Supine to sit: Max assist, HOB elevated     General bed mobility comments: pt requires assistance to slide legs to edge of bed although she does initiate. Pt with assist to pivot hips and to elevate trunk into sitting    Transfers Overall transfer level: Needs assistance Equipment used: 1 person hand held assist Transfers: Sit to/from Stand, Bed to chair/wheelchair/BSC Sit to Stand: Total assist     Squat pivot transfers: Max assist     General transfer comment: pt does initiate sit to stand but unable to extend hips with PT verbal cues    Ambulation/Gait                   Stairs             Wheelchair Mobility    Modified Rankin (Stroke Patients Only) Modified Rankin (Stroke Patients Only) Pre-Morbid Rankin Score: No symptoms Modified Rankin: Severe disability     Balance Overall balance assessment: Needs assistance Sitting-balance support: Single extremity supported, Bilateral upper extremity supported, Feet supported Sitting balance-Leahy Scale: Poor Sitting balance - Comments: minG Postural control: Posterior lean Standing balance support: Bilateral upper extremity supported Standing balance-Leahy Scale: Zero Standing balance comment: max-totalA, flexed posture, unable to extend hips completely                            Cognition Arousal/Alertness: Awake/alert Behavior During Therapy: Flat affect Overall Cognitive Status: Difficult to assess Area of Impairment: Problem solving  Problem Solving: Slow processing, Decreased initiation, Difficulty sequencing, Requires verbal cues          Exercises General Exercises - Lower Extremity Ankle Circles/Pumps: AAROM, Both, 10 reps Gluteal Sets: AROM, Both, 5  reps Long Arc Quad: AAROM, Both, 10 reps    General Comments General comments (skin integrity, edema, etc.): VSS on RA      Pertinent Vitals/Pain Pain Assessment Pain Assessment: Faces Faces Pain Scale: Hurts even more Pain Location: LEs with knee flexion/extension Pain Descriptors / Indicators: Grimacing Pain Intervention(s): Monitored during session    Home Living                          Prior Function            PT Goals (current goals can now be found in the care plan section) Acute Rehab PT Goals Patient Stated Goal: unable to state Progress towards PT goals: Progressing toward goals    Frequency    Min 4X/week      PT Plan Current plan remains appropriate    Co-evaluation              AM-PAC PT "6 Clicks" Mobility   Outcome Measure  Help needed turning from your back to your side while in a flat bed without using bedrails?: A Lot Help needed moving from lying on your back to sitting on the side of a flat bed without using bedrails?: A Lot Help needed moving to and from a bed to a chair (including a wheelchair)?: A Lot Help needed standing up from a chair using your arms (e.g., wheelchair or bedside chair)?: Total Help needed to walk in hospital room?: Total Help needed climbing 3-5 steps with a railing? : Total 6 Click Score: 9    End of Session   Activity Tolerance: Patient tolerated treatment well Patient left: in chair;with call bell/phone within reach;with chair alarm set;with family/visitor present Nurse Communication: Mobility status;Need for lift equipment PT Visit Diagnosis: Other abnormalities of gait and mobility (R26.89);Other symptoms and signs involving the nervous system (R29.898)     Time: [id number] PT Time Calculation (min) (ACUTE ONLY): 28 min  Charges:  $Therapeutic Exercise: 8-22 mins $Therapeutic Activity: 8-22 mins                     Zenaida Niece, PT, DPT Acute Rehabilitation Pager: [phone] Office  [place] Lylie Blacklock 03/04/2022, 9:58 AM

## 2022-03-04 NOTE — Progress Notes (Signed)
   NAME:  Cindy Brooks, MRN:  [id number], DOB:  [birth year], LOS: 81 ADMISSION DATE:  01/20/2022, CONSULTATION DATE:  4/9 REFERRING MD:  Hal Hope, CHIEF COMPLAINT:  Confusion   History of Present Illness:  51 y/o female admitted on 4/9 in the setting of a thalamic stroke, treated for possible Wernicke's encphalopathy as well.  She required intubation for airway protection on admission and had a tracheostomy placed on 4/16.  PCCM is following for tracheostomy management.  Pertinent  Medical History  Hypertension TIA  Significant Hospital Events: Including procedures, antibiotic start and stop dates in addition to other pertinent events   4/9 Admit  4/12 ongoing high dose thiamine for possible Wernike's encephalopathy  4/13 IV sedation stopped, phenobarbital wean 4/17 still not waking up. Trach placed.  4/18 PEG placed. Spiking fever. Lactate spiked. Got fluid. Cleared.  4/22 Tolerated 6hrs of ATC day prior. No major events overnight, appears that stroke had [place] meeting with family 4/21 with no decision made 4/23 No issues overnight, on vent this am, remains unresponsive  4/27 changed to propranolol from metoprolol for neuro-storming. Started on vanc and cefepime for possible HCAP  4/28 trial on open ended trach collar 4/30  trach exchange 6 cuffless 5/16 trach capped 5/18 MBS dys 1 diet 5/19 trach decannulated  Interim History / Subjective:  Cindy Brooks has done well since decannulation. She denies complaints.   Objective   Blood pressure 133/89, pulse 89, temperature 98.8 F (37.1 C), temperature source Oral, resp. rate 17, height 5\' 4"  (1.626 m), weight 79.8 kg, SpO2 99 %.        Intake/Output Summary (Last 24 hours) at 03/04/2022 0905 Last data filed at 03/04/2022 0348 Gross per 24 hour  Intake --  Output 400 ml  Net -400 ml    Filed Weights   03/02/22 0451 03/03/22 0500 03/04/22 0500  Weight: 79.8 kg 79.8 kg 79.8 kg    Examination: General: Chronically ill-appearing  woman sitting up in bed in no acute distress HEENT: [place]/AT, eyes anicteric  Neck occlusive dressing in place still small air leak from tracheostomy site neuro: Awake and alert, right eye only open.  Said good morning, but otherwise mostly shaking her head to answer questions.  Moving both upper extremities.  Delayed responses. 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  Still needs to work with SLP.  Slowly progressing with her rehab. -Keep occlusive dressing until her tracheostomy site has completely closed.  PCCM will sign off.  Please reconsult if there are concerns.   Julian Hy, DO 03/04/22 9:09 AM [facility]

## 2022-03-04 NOTE — Progress Notes (Signed)
Patient ID: [name], female   DOB: [birth year], 51 y.o.   MRN: [id number]    Progress Note from the Palliative Medicine Team at [facility]   Patient Name: Cindy Brooks        Date: 03/04/2022 DOB: August 25, 1971  Age: 51 y.o. MRN#: [id number] Attending Physician: Shelly Coss, MD Primary Care Physician: Pcp, No Admit Date: 01/20/2022   Medical records reviewed   51 y.o. female  admitted on  01/20/2022 having been   found outside in her car by family on 4/8 and was moved to the house and left for 5 hours at which time she did not move, transported by EMS to [facility] emergency room, patient was obtunded with loud respirations, copious secretions, difficulty protecting her airway, was intubated      [facility] events   4/9 Admit       -CT head No acute abnormality.        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 Trach placed.  4/18 PEG placed. Spiking fever. Lactate spiked. Got fluid. Cleared.  4/22 Tolerated 6hrs of ATC day prior. No major events overnight, appears that stroke had [place] meeting with family 4/21 with no decision made 4/27 remains on the , unsuccessful wean, remains unresponsive  4/28 trial on trach collar 02/12/22 remains on trach collar, unresponsive to tactile, copious secretions  02-18-22 I was unable to get any response today with commands or gentle touch  02-25-22 patient  was following simple  commands 03-04-22 she is de-cannulated, tolerating dysphagia 1 diet, evaluation for CIR in consideration    Family face ongoing treatment option decisions, advanced directive decisions and anticipatory care needs.   I spoke to son/ Derrick at bedside today.  Continued education regarding current medical situation, family is hopeful for continued recovery.    Emotional support offered.     Plan of Care: -Full code -Family is open to all offered and available medical interventions to prolong life.    I encouraged her family  to continue conversations among themselves and with the medical team as they make decisions into the future and always keeping the patient at the center of those decisions.  Questions and concerns addressed     PMT will continue to support holistically  Wadie Lessen NP  Palliative Medicine Team Team Phone # [phone] Pager [phone]

## 2022-03-04 NOTE — Progress Notes (Signed)
Inpatient Rehab Admissions Coordinator:  Began insurance authorization. Will continue to follow.   Vashti Bolanos Graves Madden, MS, CCC-SLP Admissions Coordinator [phone]  

## 2022-03-04 NOTE — Progress Notes (Signed)
PROGRESS NOTE  Cindy Brooks  WIO:[id number] DOB: 02-Oct-1971 DOA: 01/20/2022 PCP: Pcp, No   Brief Narrative:  Patient is a 51 year old female with history of uncontrolled hypertension, history of TIA who presented to the [facility] emergency department on 4/9 after being found unresponsive by his family.  Patient was found to be obtunded with copious secretions, subsequently intubated on 4/9 for airway protection.  Hospital course remarkable for uncontrolled hypertension, work-up also showed thalamic stroke.  Wernicke's encephalopathy also suspected.  Transferred to [facility] service.    PCCM was following ,she has been de-cannulated on 5/19.  Pending placement.  Important events:  4/9 Admit  4/12 ongoing high dose thiamine for possible Wernike's encephalopathy  4/13 IV sedation stopped, phenobarbital wean 4/17 still not waking up. Trach placed.  4/18 PEG placed. Spiking fever. Lactate spiked. Got fluid. Cleared.  4/22 Tolerated 6hrs of ATC day prior. No major events overnight, appears that stroke had [place] meeting with family 4/21 with no decision made 4/23 No issues overnight, on vent this am, remains unresponsive  4/27 changed to propranolol from metoprolol for neuro-storming. Started on vanc and cefepime for possible HCAP  4/28 trial on open ended trach collar 5/2 patient transferred to Hospitalist Service.  5/14 waking up and following simple commands. 5/15 downsized tracheostomy to #4 5/16 tracheostomy capping trials. 5/19 Decannulation   Assessment & Plan:  Principal Problem:   Thalamic stroke ([place]) Active Problems:   Acute encephalopathy   Respiratory failure (HCC)   Tracheostomy dependence ([place])   Hypertension   PEG (percutaneous endoscopic gastrostomy) status (HCC)   Pressure injury of skin   Antiphospholipid antibody with hypercoagulable state ([place])   Hypernatremia   Coma ([place])   Fever   Leukocytosis   Sympathetic storming   HCAP (healthcare-associated  pneumonia)  Acute metabolic encephalopathy: Unresponsive on presentation.  Multifactorial: Thalamic stroke, Warnicke's encephalopathy.  Monitor mental status. MRI brain on 4/11 showed patchy restricted diffusion on both thalami and midbrain.CT angiogram head and neck 4/11, severe bilateral P1 and right P2 PCA stenosis, bilateral ophthalmic infarct, severe left proximal M2 MCA branch stenosis, severe right A2 ACA branch stenosis, no evidence of dural venous thrombosis. Warnicke's encephalopathy was suspected and he was given high-dose thiamine.  Continue oral thiamine.  Patient was also on phenobarbital which was weaned off. Status post trach, PEG placement.  Remains on amantadine, gabapentin.  Acute hypoxic respiratory failure: Intubated on presentation for airway protection.  Prolonged ventilator use/tracheostomy.  Hospital course also remarkable for healthcare associated pneumonia, treated with antibiotics, completed the course.  Repeat chest x-ray showed chronic atelectasis.  No evidence of new findings.  PCCM were following ,decannulated on 5/19  Thalamic stroke: MRI as above.  Positive anticardiolipin IgM, lupus anticoagulant: Neurology was following and recommended Plavix.  Dysphagia: Status post PEG.  Continue tube feeding.  Speech therapy closely following, recommending dysphagia 1 diet  Hypertension: Currently blood pressure stable.  On amlodipine, propanolol, hydralazine  History of hyperlipidemia: On Crestor  Obesity: BMI of 30.3  Debility: Patient seen by PT and OT and recommended CIR.  Likely the discharge plan will be to SNF        Nutrition Problem: Inadequate oral intake Etiology: inability to eat     DVT prophylaxis:SCDs Start: 01/20/22 1012     Code Status: Full Code  Family Communication: None at bedside  Patient status:Inpatient  Patient is from :Home  Anticipated discharge to:SnF/CIR  Estimated DC date:Not sure.  Long length of stay   Consultants:  PCCM  Procedures: Intubation, extubation, trach, PEG  Antimicrobials:  Anti-infectives (From admission, onward)    Start     Dose/Rate Route Frequency Ordered Stop   02/08/22 1800  vancomycin (VANCOREADY) IVPB 1500 mg/300 mL  Status:  Discontinued        1,500 mg 150 mL/hr over 120 Minutes Intravenous Every 24 hours 02/07/22 1712 02/09/22 1046   02/07/22 1800  vancomycin (VANCOREADY) IVPB 1750 mg/350 mL        1,750 mg 175 mL/hr over 120 Minutes Intravenous  Once 02/07/22 1708 02/07/22 2046   02/07/22 1700  ceFEPIme (MAXIPIME) 2 g in sodium chloride 0.9 % 100 mL IVPB        2 g 200 mL/hr over 30 Minutes Intravenous Every 8 hours 02/07/22 1706 02/14/22 0856   01/20/22 2000  cefTRIAXone (ROCEPHIN) 2 g in sodium chloride 0.9 % 100 mL IVPB        2 g 200 mL/hr over 30 Minutes Intravenous Every 24 hours 01/20/22 0634 01/24/22 2022   01/20/22 0415  cefTRIAXone (ROCEPHIN) 2 g in sodium chloride 0.9 % 100 mL IVPB        2 g 200 mL/hr over 30 Minutes Intravenous  Once 01/20/22 0414 01/20/22 0537       Subjective:  Patient seen and examined at the bedside this morning.  Hemodynamically stable.  Comfortable today.  Alert and awake and tries to communicate.  Has generalized weakness.  On room air  Objective: Vitals:   03/04/22 0300 03/04/22 0500 03/04/22 0638 03/04/22 0731  BP: 134/83  (!) 135/107 133/89  Pulse: 81   89  Resp: 20   17  Temp: 98.5 F (36.9 C)   98.8 F (37.1 C)  TempSrc: Axillary   Oral  SpO2: 100%   99%  Weight:  79.8 kg    Height:        Intake/Output Summary (Last 24 hours) at 03/04/2022 1026 Last data filed at 03/04/2022 0348 Gross per 24 hour  Intake --  Output 400 ml  Net -400 ml   Filed Weights   03/02/22 0451 03/03/22 0500 03/04/22 0500  Weight: 79.8 kg 79.8 kg 79.8 kg    Examination:  General exam: Overall comfortable, not in distress HEENT: Left eye closed Respiratory system:  no wheezes or crackles  Cardiovascular system: S1 & S2 heard, RRR.   Gastrointestinal system: Abdomen is nondistended, soft and nontender. Central nervous system: Alert and awake, dysarthria, generalized weakness Extremities: No edema, no clubbing ,no cyanosis Skin: No rashes, no ulcers,no icterus    Data Reviewed: I have personally reviewed following labs and imaging studies  CBC: No results for input(s): WBC, NEUTROABS, HGB, HCT, MCV, PLT in the last 168 hours.  Basic Metabolic Panel: No results for input(s): NA, K, CL, CO2, GLUCOSE, BUN, CREATININE, CALCIUM, MG, PHOS in the last 168 hours.    No results found for this or any previous visit (from the past 240 hour(s)).    Radiology Studies: No results found.  Scheduled Meds:  amantadine  100 mg Per Tube BID   amLODipine  10 mg Per Tube Daily   chlorhexidine gluconate (MEDLINE KIT)  15 mL Mouth Rinse BID   Chlorhexidine Gluconate Cloth  6 each Topical Daily   clopidogrel  75 mg Per Tube Daily   docusate  100 mg Per Tube BID   feeding supplement (PROSource TF)  45 mL Per Tube Daily   folic acid  1 mg Per Tube Daily   free water  250  mL Per Tube Q6H   gabapentin  100 mg Per Tube Q12H   glycopyrrolate  1 mg Per Tube BID   guaiFENesin  10 mL Per Tube Q6H   hydrALAZINE  25 mg Per Tube Q8H   insulin aspart  0-15 Units Subcutaneous Q4H   insulin aspart  4 Units Subcutaneous Q4H   mouth rinse  15 mL Mouth Rinse 10 times per day   multivitamin with minerals  1 tablet Per Tube Daily   propranolol  40 mg Per Tube TID   rosuvastatin  40 mg Per Tube Daily   senna  1 tablet Per Tube Daily   thiamine  100 mg Per Tube Daily   Continuous Infusions:  sodium chloride Stopped (02/11/22 0928)   feeding supplement (JEVITY 1.5 CAL/FIBER) 1,000 mL (03/04/22 0309)     LOS: 55 days   Shelly Coss, MD [facility] P5/22/2023, 10:26 AM

## 2022-03-05 DIAGNOSIS — Z9889 Other specified postprocedural states: Secondary | ICD-10-CM | POA: Diagnosis not present

## 2022-03-05 DIAGNOSIS — R1310 Dysphagia, unspecified: Secondary | ICD-10-CM | POA: Diagnosis not present

## 2022-03-05 DIAGNOSIS — I6381 Other cerebral infarction due to occlusion or stenosis of small artery: Secondary | ICD-10-CM | POA: Diagnosis not present

## 2022-03-05 LAB — CBC
HCT: 34.3 % — ABNORMAL LOW (ref 36.0–46.0)
Hemoglobin: 11.4 g/dL — ABNORMAL LOW (ref 12.0–15.0)
MCH: 26.9 pg (ref 26.0–34.0)
MCHC: 33.2 g/dL (ref 30.0–36.0)
MCV: 80.9 fL (ref 80.0–100.0)
Platelets: 230 10*3/uL (ref 150–400)
RBC: 4.24 MIL/uL (ref 3.87–5.11)
RDW: 15.6 % — ABNORMAL HIGH (ref 11.5–15.5)
WBC: 8.2 10*3/uL (ref 4.0–10.5)
nRBC: 0 % (ref 0.0–0.2)

## 2022-03-05 LAB — BASIC METABOLIC PANEL
Anion gap: 6 (ref 5–15)
BUN: 18 mg/dL (ref 6–20)
CO2: 27 mmol/L (ref 22–32)
Calcium: 9.8 mg/dL (ref 8.9–10.3)
Chloride: 110 mmol/L (ref 98–111)
Creatinine, Ser: 0.74 mg/dL (ref 0.44–1.00)
GFR, Estimated: >60 mL/min (ref >60)
Glucose, Bld: 135 mg/dL — ABNORMAL HIGH (ref 70–99)
Potassium: 4.1 mmol/L (ref 3.5–5.1)
Sodium: 143 mmol/L (ref 135–145)

## 2022-03-05 LAB — GLUCOSE, CAPILLARY
Glucose-Capillary: 123 mg/dL — ABNORMAL HIGH (ref 70–99)
Glucose-Capillary: 133 mg/dL — ABNORMAL HIGH (ref 70–99)
Glucose-Capillary: 186 mg/dL — ABNORMAL HIGH (ref 70–99)
Glucose-Capillary: 186 mg/dL — ABNORMAL HIGH (ref 70–99)
Glucose-Capillary: 118 mg/dL — ABNORMAL HIGH (ref 70–99)
Glucose-Capillary: 145 mg/dL — ABNORMAL HIGH (ref 70–99)
Glucose-Capillary: 174 mg/dL — ABNORMAL HIGH (ref 70–99)
Glucose-Capillary: 146 mg/dL — ABNORMAL HIGH (ref 70–99)

## 2022-03-05 MED ORDER — HYDRALAZINE HCL 50 MG PO TABS
50.0000 mg | ORAL_TABLET | Freq: Three times a day (TID) | ORAL | Status: DC
  Administered 2022-03-05 – 2022-03-17 (×38): 50 mg
  Filled 2022-03-05 (×38): qty 1

## 2022-03-05 MED ORDER — JEVITY 1.5 CAL/FIBER PO LIQD
1000.0000 mL | ORAL | Status: DC
  Administered 2022-03-05 – 2022-03-11 (×8): 1000 mL
  Filled 2022-03-05 (×7): qty 1000

## 2022-03-05 MED ORDER — ENSURE ENLIVE PO LIQD
237.0000 mL | Freq: Two times a day (BID) | ORAL | Status: DC
  Administered 2022-03-06 – 2022-03-15 (×12): 237 mL via ORAL

## 2022-03-05 NOTE — Progress Notes (Signed)
Inpatient Rehab Admissions Coordinator:  Reviewed pt with physiatrists this morning.  Spoke with pt's daughter Caryn Bee. Called her to confirm that she is aware of the amount of 24/7 physical assistance pt may require after therapy. Also reiterated that insurance will not pay for therapy at [facility] and SNF. She was under the impression that both options would be available. She would like to discuss with her sister. Will continue to follow.   Wolfgang [place], MS, CCC-SLP Admissions Coordinator [phone]

## 2022-03-05 NOTE — Progress Notes (Signed)
PROGRESS NOTE  Cindy Brooks  RTM:[id number] DOB: 1971-08-05 DOA: 01/20/2022 PCP: Pcp, No   Brief Narrative:  Patient is a 51 year old female with history of uncontrolled hypertension, history of TIA who presented to the [facility] emergency department on 4/9 after being found unresponsive by his family.  Patient was found to be obtunded with copious secretions, subsequently intubated on 4/9 for airway protection.  Hospital course remarkable for uncontrolled hypertension, work-up also showed thalamic stroke.  Wernicke's encephalopathy also suspected.  Transferred to [facility] service.    PCCM was following ,she has been de-cannulated on 5/19.  Pending placement.  Important events:  4/9 Admit  4/12 ongoing high dose thiamine for possible Wernike's encephalopathy  4/13 IV sedation stopped, phenobarbital wean 4/17 still not waking up. Trach placed.  4/18 PEG placed. Spiking fever. Lactate spiked. Got fluid. Cleared.  4/22 Tolerated 6hrs of ATC day prior. No major events overnight, appears that stroke had [place] meeting with family 4/21 with no decision made 4/23 No issues overnight, on vent this am, remains unresponsive  4/27 changed to propranolol from metoprolol for neuro-storming. Started on vanc and cefepime for possible HCAP  4/28 trial on open ended trach collar 5/2 patient transferred to Hospitalist Service.  5/14 waking up and following simple commands. 5/15 downsized tracheostomy to #4 5/16 tracheostomy capping trials. 5/19 Decannulation   Assessment & Plan:  Principal Problem:   Thalamic stroke ([place]) Active Problems:   Acute encephalopathy   Respiratory failure (HCC)   Tracheostomy dependence ([place])   Hypertension   PEG (percutaneous endoscopic gastrostomy) status (HCC)   Pressure injury of skin   Antiphospholipid antibody with hypercoagulable state ([place])   Hypernatremia   Coma ([place])   Fever   Leukocytosis   Sympathetic storming   HCAP (healthcare-associated  pneumonia)  Acute metabolic encephalopathy: Unresponsive on presentation.  Multifactorial: Thalamic stroke, Warnicke's encephalopathy.  Monitor mental status. MRI brain on 4/11 showed patchy restricted diffusion on both thalami and midbrain.CT angiogram head and neck 4/11, severe bilateral P1 and right P2 PCA stenosis, bilateral ophthalmic infarct, severe left proximal M2 MCA branch stenosis, severe right A2 ACA branch stenosis, no evidence of dural venous thrombosis. Warnicke's encephalopathy was suspected and he was given high-dose thiamine.  Continue oral thiamine.  Patient was also on phenobarbital which was weaned off. Status post trach, PEG placement.  Remains on amantadine, gabapentin.  Acute hypoxic respiratory failure: Intubated on presentation for airway protection.  Prolonged ventilator use/tracheostomy.  Hospital course also remarkable for healthcare associated pneumonia, treated with antibiotics, completed the course.  Repeat chest x-ray showed chronic atelectasis.  No evidence of new findings.  PCCM were following ,decannulated on 5/19  Thalamic stroke: MRI as above.  Positive anticardiolipin IgM, lupus anticoagulant: Neurology was following and recommended Plavix.  Dysphagia: Status post PEG.  Continue tube feeding.  Speech therapy closely following, recommending dysphagia 1 diet  Hypertension: Currently blood pressure stable.  On amlodipine, propanolol, hydralazine  History of hyperlipidemia: On Crestor  Obesity: BMI of 30.3  Debility: Patient seen by PT and OT and recommended CIR. Waiting for insurance auth       Nutrition Problem: Inadequate oral intake Etiology: inability to eat     DVT prophylaxis:SCDs Start: 01/20/22 1012     Code Status: Full Code  Family Communication: None at bedside  Patient status:Inpatient  Patient is from :Home  Anticipated discharge to:SnF/CIR  Estimated DC date:Not sure.  Long length of stay   Consultants:  PCCM  Procedures: Intubation, extubation, trach, PEG  Antimicrobials:  Anti-infectives (From admission, onward)    Start     Dose/Rate Route Frequency Ordered Stop   02/08/22 1800  vancomycin (VANCOREADY) IVPB 1500 mg/300 mL  Status:  Discontinued        1,500 mg 150 mL/hr over 120 Minutes Intravenous Every 24 hours 02/07/22 1712 02/09/22 1046   02/07/22 1800  vancomycin (VANCOREADY) IVPB 1750 mg/350 mL        1,750 mg 175 mL/hr over 120 Minutes Intravenous  Once 02/07/22 1708 02/07/22 2046   02/07/22 1700  ceFEPIme (MAXIPIME) 2 g in sodium chloride 0.9 % 100 mL IVPB        2 g 200 mL/hr over 30 Minutes Intravenous Every 8 hours 02/07/22 1706 02/14/22 0856   01/20/22 2000  cefTRIAXone (ROCEPHIN) 2 g in sodium chloride 0.9 % 100 mL IVPB        2 g 200 mL/hr over 30 Minutes Intravenous Every 24 hours 01/20/22 0634 01/24/22 2022   01/20/22 0415  cefTRIAXone (ROCEPHIN) 2 g in sodium chloride 0.9 % 100 mL IVPB        2 g 200 mL/hr over 30 Minutes Intravenous  Once 01/20/22 0414 01/20/22 0537       Subjective:  Patient seen and examined at the bedside this morning.  Hemodynamically stable.  Comfortable like yesterday.  No new problems  Objective: Vitals:   03/05/22 0635 03/05/22 0739 03/05/22 1002 03/05/22 1117  BP: (!) 157/113 (!) 165/99 (!) 152/109 (!) 135/103  Pulse:  95 98 94  Resp:  (!) 30 (!) 32 16  Temp:    98 F (36.7 C)  TempSrc:    Oral  SpO2:  96% 91% 99%  Weight:      Height:        Intake/Output Summary (Last 24 hours) at 03/05/2022 1136 Last data filed at 03/05/2022 0900 Gross per 24 hour  Intake 117 ml  Output 650 ml  Net -533 ml   Filed Weights   03/03/22 0500 03/04/22 0500 03/05/22 0500  Weight: 79.8 kg 79.8 kg 79.8 kg    Examination:  General exam: Overall comfortable, not in distress, deconditioned HEENT: Left eye closed Respiratory system:  no wheezes or crackles  Cardiovascular system: S1 & S2 heard, RRR.  Gastrointestinal system: Abdomen is  nondistended, soft and nontender.  PEG Central nervous system: Alert and awake, generalized weakness extremities: No edema, no clubbing ,no cyanosis Skin: No rashes, no ulcers,no icterus    Data Reviewed: I have personally reviewed following labs and imaging studies  CBC: Recent Labs  Lab 03/05/22 0353  WBC 8.2  HGB 11.4*  HCT 34.3*  MCV 80.9  PLT 491    Basic Metabolic Panel: Recent Labs  Lab 03/05/22 0353  NA 143  K 4.1  CL 110  CO2 27  GLUCOSE 135*  BUN 18  CREATININE 0.74  CALCIUM 9.8      No results found for this or any previous visit (from the past 240 hour(s)).    Radiology Studies: No results found.  Scheduled Meds:  amantadine  100 mg Per Tube BID   amLODipine  10 mg Per Tube Daily   chlorhexidine gluconate (MEDLINE KIT)  15 mL Mouth Rinse BID   Chlorhexidine Gluconate Cloth  6 each Topical Daily   clopidogrel  75 mg Per Tube Daily   docusate  100 mg Per Tube BID   feeding supplement (PROSource TF)  45 mL Per Tube Daily   folic acid  1 mg Per  Tube Daily   free water  250 mL Per Tube Q6H   gabapentin  100 mg Per Tube Q12H   glycopyrrolate  1 mg Per Tube BID   guaiFENesin  10 mL Per Tube Q6H   hydrALAZINE  25 mg Per Tube Q8H   insulin aspart  0-15 Units Subcutaneous Q4H   insulin aspart  4 Units Subcutaneous Q4H   mouth rinse  15 mL Mouth Rinse 10 times per day   multivitamin with minerals  1 tablet Per Tube Daily   propranolol  40 mg Per Tube TID   rosuvastatin  40 mg Per Tube Daily   senna  1 tablet Per Tube Daily   thiamine  100 mg Per Tube Daily   Continuous Infusions:  sodium chloride Stopped (02/11/22 0928)   feeding supplement (JEVITY 1.5 CAL/FIBER) 1,000 mL (03/05/22 0155)     LOS: 5 days   Shelly Coss, MD [facility] P5/23/2023, 11:36 AM

## 2022-03-05 NOTE — Progress Notes (Signed)
Nutrition Follow-up  DOCUMENTATION CODES:   Not applicable  INTERVENTION:   Transition to nocturnal tube feeds via PEG tube: - Jevity 1.5 @ 70 ml/hr x 12 hours from 1800 to 0600 (total of 840 ml) - Free water flushes of 250 ml q 6 hours  Nocturnal tube feeding regimen provides 1260 kcal, 54 grams of protein, and 638 ml of H2O (meets 70% of minimum kcal needs and 60% of minimum protein needs).  Total free water with flushes: 1638 ml  - Continue MVI with minerals daily per tube  - Trial Ensure Enlive po BID, each supplement provides 350 kcal and 20 grams of protein  NUTRITION DIAGNOSIS:   Inadequate oral intake related to inability to eat as evidenced by NPO status.  Progressing, pt now on dysphagia 1 diet with thin liquids  GOAL:   Patient will meet greater than or equal to 90% of their needs  Progressing, being addressed via TF and oral nutrition supplements  MONITOR:   PO intake, Supplement acceptance, Diet advancement, Labs, Weight trends, TF tolerance, Skin  REASON FOR ASSESSMENT:   Ventilator, Consult Enteral/tube feeding initiation and management  ASSESSMENT:   51 year old female who presented to the ED on 4/09 with AMS. PMH of EtOH abuse, HTN, TIA, medication noncompliance. Pt admitted with acute encephalopathy.  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 Discussed pt with RN. Pt eating some at meals with most meal completions being 40-50%. RD to order oral nutrition supplements to aid pt in meeting kcal and protein needs via PO route. Will also transition pt to nocturnal tube feeds to stimulate appetite and  promote PO intake during the day. Discussed plan with RN.  Meal Completion: 0-50%  Current TF: Jevity 1.5 @ 55 ml/hr, ProSource TF 45 ml daily, free water flushes 250 ml q 6 hours  Admit weight: 78.7 kg Current weight: 79.8 kg  Pt with non-pitting edema to BUE and RLE.  Medications reviewed and include: colace, folic acid, SSI q 4 hours, novolog 4 units q 4 hours, MVI with minerals daily, senna, thiamine  Vitamin/Mineral Profile: Vitamin B6: 4.6 (WNL) Vitamin B12: 930 (H) Vitamin A: 44.9 (WNL) Zinc: 69 (WNL)  CRP: 7.6 (H) Vitamin B1: 365.8 (H)  Labs reviewed. CBG's: 123-186 x 24 hours  Diet Order:   Diet Order             DIET - DYS 1 Room service appropriate? Yes; Fluid consistency: Thin  Diet effective now                   EDUCATION NEEDS:   No education needs have been identified at this time  Skin:  Skin Assessment: Skin Integrity Issues: Stage II: labia  Last BM:  03/04/22  Height:   Ht Readings from Last 1 Encounters:  01/20/22 5\' 4"  (1.626 m)    Weight:   Wt Readings from Last 1 Encounters:  03/05/22 79.8 kg    BMI:  Body mass index is 30.2 kg/m.  Estimated Nutritional Needs:   Kcal:  1800-2000  Protein:  90-110 grams  Fluid:  1.8-2.0 L    Anda Kraft  Vertell Limber, MS, RD, LDN Inpatient Clinical Dietitian Please see AMiON for contact information.

## 2022-03-05 NOTE — Progress Notes (Signed)
Occupational Therapy Treatment Patient Details Name: Cindy Brooks MRN: [id number] DOB: 10/17/1970 Today's Date: 03/05/2022   History of present illness Pt is a 51 y.o. female with history of HTN, TIA, ETOH abuse and medication noncompliance presenting to the ED after she was found by her family to be unresponsive in her car on 4/8. 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 Intubated 4/9. Tracheostormy 4/16.  Trach capped 5/16.   OT comments  Cindy Brooks is showing some progression in active movements and participation in functional tasks. She continues to require max A for bed mobility, and declined to progress to EOB this date. Bed placed in chair position for chair position to complete grooming. Hand over hand required for grooming tasks. BUE and cervical AAROM complete with some pain noted. Pt continues to benefit. D/c recommendation remains appropriate.    Recommendations for follow up therapy are one component of a multi-disciplinary discharge planning process, led by the attending physician.  Recommendations may be updated based on patient status, additional functional criteria and insurance authorization.    Follow Up Recommendations  Acute inpatient rehab (3hours/day)    Assistance Recommended at Discharge Frequent or constant Supervision/Assistance  Patient can return home with the following  Two people to help with walking and/or transfers;Two people to help with bathing/dressing/bathroom;Assistance with cooking/housework;Assistance with feeding;Direct supervision/assist for medications management;Direct supervision/assist for financial management;Assist for transportation;Help with stairs or ramp for entrance   Equipment Recommendations  Other (comment)    Recommendations for Other Services Rehab consult     Precautions / Restrictions Precautions Precautions: Fall Precaution Comments: PEG, stoma Restrictions Weight Bearing Restrictions: No       Mobility Bed Mobility Overal bed mobility: Needs Assistance   Rolling: Max assist         General bed mobility comments: max A, pt able to ove extremeties with cues to assist    Transfers                             ADL either performed or assessed with clinical judgement   ADL Overall ADL's : Needs assistance/impaired     Grooming: Maximal assistance Grooming Details (indicate cue type and reason): cues for sequencing, hand over hand                               General ADL Comments: focused on activity tolerance, grooming and BUE exercises    Extremity/Trunk Assessment Upper Extremity Assessment Upper Extremity Assessment: LUE deficits/detail;RUE deficits/detail RUE Deficits / Details: grimacing with AAROM, some active movement noted at hand, wrist and elbow. RUE Sensation: WNL RUE Coordination: decreased fine motor;decreased gross motor LUE Deficits / Details: pain with AAROM, active movements in hand wrist and elbow. required assist to get hand to mouth to complete oral hygiene LUE Sensation: WNL LUE Coordination: decreased gross motor;decreased fine motor   Lower Extremity Assessment Lower Extremity Assessment: Defer to PT evaluation        Vision   Vision Assessment?: Vision impaired- to be further tested in functional context Additional Comments: pt's L eye swollen   Perception Perception Perception: Not tested   Praxis Praxis Praxis: Not tested    Cognition Arousal/Alertness: Awake/alert Behavior During Therapy: Flat affect Overall Cognitive Status: Difficult to assess Area of Impairment: Problem solving  Current Attention Level: Focused   Following Commands: Follows one step commands with increased time     Problem Solving: Slow processing,  Decreased initiation, Difficulty sequencing, Requires verbal cues General Comments: following some one step commands with increased time. shaking head for yes/no accurately. requires encouragement for participation        Exercises Other Exercises Other Exercises: AAROM of bilat elbows and shoulders Other Exercises: Cervical rotation adn extention AAROM    Shoulder Instructions       General Comments VSS on RA    Pertinent Vitals/ Pain       Pain Assessment Pain Assessment: Faces Faces Pain Scale: Hurts little more Pain Location: with all movement Pain Descriptors / Indicators: Grimacing Pain Intervention(s): Limited activity within patient's tolerance, Monitored during session   Frequency  Min 2X/week        Progress Toward Goals  OT Goals(current goals can now be found in the care plan section)  Progress towards OT goals: Progressing toward goals  Acute Rehab OT Goals Patient Stated Goal: unable to state OT Goal Formulation: Patient unable to participate in goal setting Time For Goal Achievement: 03/17/22 Potential to Achieve Goals: Good ADL Goals Pt Will Perform Grooming: with mod assist;sitting Pt Will Perform Upper Body Bathing: with mod assist;sitting Pt Will Transfer to Toilet: with max assist;stand pivot transfer;bedside commode Additional ADL Goal #1: Pt will complete bed mobility with mod A as a precursor to ADLs Additional ADL Goal #2: Pt will follow follow simple one step commands 100% of the session to assist in ADLs  Plan Discharge plan remains appropriate    Co-evaluation                 AM-PAC OT "6 Clicks" Daily Activity     Outcome Measure   Help from another person eating meals?: A Lot Help from another person taking care of personal grooming?: A Lot Help from another person toileting, which includes using toliet, bedpan, or urinal?: Total Help from another person bathing (including washing, rinsing, drying)?: Total Help from  another person to put on and taking off regular upper body clothing?: A Lot Help from another person to put on and taking off regular lower body clothing?: Total 6 Click Score: 9    End of Session    OT Visit Diagnosis: Unsteadiness on feet (R26.81);Other abnormalities of gait and mobility (R26.89);Muscle weakness (generalized) (M62.81);Pain   Activity Tolerance Patient tolerated treatment well   Patient Left in bed;with call bell/phone within reach;with bed alarm set   Nurse Communication Mobility status        Time: [id number] OT Time Calculation (min): 18 min  Charges: OT General Charges $OT Visit: 1 Visit OT Treatments $Therapeutic Activity: 8-22 mins   Irene Mitcham A Enisa Runyan 03/05/2022, 11:52 AM

## 2022-03-05 NOTE — Progress Notes (Signed)
CIR admissions has initiated insurance authorization through [facility].  CSW following until authorization is obtained and patient can be discharged.  Edwin Dada, MSW, LCSW Transitions of Care  Clinical Social Worker II [phone]

## 2022-03-06 DIAGNOSIS — J9601 Acute respiratory failure with hypoxia: Secondary | ICD-10-CM

## 2022-03-06 DIAGNOSIS — Z930 Tracheostomy status: Secondary | ICD-10-CM

## 2022-03-06 LAB — GLUCOSE, CAPILLARY
Glucose-Capillary: 134 mg/dL — ABNORMAL HIGH (ref 70–99)
Glucose-Capillary: 89 mg/dL (ref 70–99)
Glucose-Capillary: 121 mg/dL — ABNORMAL HIGH (ref 70–99)
Glucose-Capillary: 112 mg/dL — ABNORMAL HIGH (ref 70–99)
Glucose-Capillary: 164 mg/dL — ABNORMAL HIGH (ref 70–99)

## 2022-03-06 NOTE — Progress Notes (Signed)
Inpatient Rehab Admissions Coordinator:   Pt.'s insurance is not in network for CIR, so we will not pursue admission. I have notified Pt.s daughter and will let TOC know.  Megan Salon, MS, CCC-SLP Rehab Admissions Coordinator  [phone] (celll) [phone] (office)

## 2022-03-06 NOTE — Progress Notes (Signed)
Speech Language Pathology Treatment: Dysphagia  Patient Details Name: Cindy Brooks MRN: [id number] DOB: 15-Oct-1970 Today's Date: 03/06/2022 Time: 1347-1400 SLP Time Calculation (min) (ACUTE ONLY): 13 min  Assessment / Plan / Recommendation Clinical Impression  Pt awakened from sleep, but still able to awaken enough to initiate self feeding with some assist for grip and ROM. Pt able to masticate solid without oral holding today. No signs of aspiration. Pt did not have much interest in food and shook head no after a few bites. Pt also able to respond in some short phrases but eventually fatigued and stopped responding. Cindy Brooks at bedside reported pt had eated one meal about 40% though she didn't really want lunch today. We talked about how Cindy Brooks may eat well one meal and skip the next until her hunger cues improve. Will f/u for check of tolerance of mechanical soft texture.   HPI HPI: Pt is a 51 y/o female who presented to the ED after being found unresponsive in her care. Pt admitted 4/9 with thalamic stroke, treated for possible acute metabolic and wernicke's encphalopathy as well. She required intubation for airway protection on admission and had a tracheostomy placed on 4/16.  PEG on 4/18. MRI 4/19: Multiple foci of restricted diffusion in the bilateral frontal lobes and parietal lobes primarily. Started tolerating ATC on 4/22. Has suffered from severe encephalopathy, neurostorming. Started following commands around 5/15. At time of evaluation, pt with a 4 cuffless trach which is capped and may be decannulated within the next 24-48 hours. PMH: HTN, TIA, ETOH abuse and medication noncompliance.      SLP Plan  Continue with current plan of care      Recommendations for follow up therapy are one component of a multi-disciplinary discharge planning process, led by the attending physician.  Recommendations may be updated based on patient status, additional functional criteria and insurance  authorization.    Recommendations  Diet recommendations: Dysphagia 3 (mechanical soft);Thin liquid Liquids provided via: Cup;Straw Medication Administration: Crushed with puree Supervision: Full supervision/cueing for compensatory strategies                Oral Care Recommendations: Oral care BID Follow Up Recommendations: Skilled nursing-short term rehab (<3 hours/day) Assistance recommended at discharge: Frequent or constant Supervision/Assistance SLP Visit Diagnosis: Dysphagia, unspecified (R13.10) Plan: Continue with current plan of care           Cindy Brooks, Cindy Brooks  03/06/2022, 2:07 PM

## 2022-03-06 NOTE — H&P (Incomplete)
Physical Medicine and Rehabilitation Admission H&P    CC: acute ischemic stroke, encephalopathy, alcohol abuse  HPI: Cindy Brooks is a 51 year old female who was found down by her family on 01/18/2022.  She was unresponsive but breathing.  She was taken via EMS to [facility] emergency department.  Early assessment revealed hypertensive encephalopathy and acute lower urinary tract infection.  She was started on thiamine and nitroglycerin infusion.  The patient's daughter provided pertinent history and states the patient drinks alcohol 4-5 times weekly.  Past medical history significant for hypertension and medical noncompliance.  Urine drug screen negative.  She developed agitation and combativeness and serum glucose 57 treated with D50 and D5 normal saline.  CT head without acute changes.  Neurology consulted and she was evaluated by Dr. Cheral Marker.  Upon further interview with family members, he was told that the patient consumes approximately one 12 pack of beer per day in addition to fifth of hard liquor daily.  Admitted to hospitalist service.  EEG revealed a benign pattern without epileptiform discharges.  On 4/9, she was obtunded with oral secretions and coughing and pulmonary critical care medicine consulted for transfer to ICU.  She was intubated and orogastric tube placed.  Agitation management with fentanyl and Precedex.  Lactic acidosis treated and she was started on phenobarbital.  MRI brain performed which showed multiple punctate acute ischemic infarcts in bilateral hemispheres likely secondary to multifocal intracranial stenosis and possible embolic source.  Treatment for Warnicke's encephalopathy continued. Amantadine started.  Completed ceftriaxone for UTI.  4/13, her IV sedation was stopped and her phenobarbital was weaned.  She required tracheostomy on 4/17 and PEG placement on 4/18.  She was extubated and gradually became more responsive and following simple commands by 5/15.  Her tracheostomy  was decannulated on 5/22 and she was tolerating dysphagia 1 diet.  Remains on continuous tube feeds and supplements. 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  Plan to repeat testing in 12 weeks to confirm.  Remains on Plavix 75 mg daily without aspirin due to allergy.  Lovenox for VTE prophylaxis appears discontinued on 4/29. The patient requires inpatient physical medicine and rehabilitation evaluations and treatment secondary to dysfunction due to ischemic stroke and encephalopathy  2D echo: 55-60% LDL =132 A1c = 5.7.  ROS Past Medical History:  Diagnosis Date   Hypertension    Hypertensive urgency 01/20/2022   Seizure ([place]) 01/28/2022   TIA (transient ischemic attack) 2019   Urinary tract infection 01/28/2022   Past Surgical History:  Procedure Laterality Date   ESOPHAGOGASTRODUODENOSCOPY N/A 01/28/2022   Procedure: ESOPHAGOGASTRODUODENOSCOPY (EGD);  Surgeon: Jesusita Oka, MD;  Location: [facility] ENDOSCOPY;  Service: General;  Laterality: N/A;   PEG PLACEMENT N/A 01/28/2022   Procedure: PERCUTANEOUS ENDOSCOPIC GASTROSTOMY (PEG) PLACEMENT;  Surgeon: Jesusita Oka, MD;  Location: [facility] ENDOSCOPY;  Service: General;  Laterality: N/A;   Family History  Problem Relation Age of Onset   Diabetes Mellitus II Maternal Grandmother    Social History:  reports that she has never smoked. She has never used smokeless tobacco. She reports current alcohol use of about 203.0 standard drinks per week. She reports that she does not use drugs. Allergies:  Allergies  Allergen Reactions   Aspirin Hives and Rash   Codeine Hives and Rash   Penicillins Anaphylaxis, Swelling and Other (See Comments)   Strawberry Extract Anaphylaxis and Hives   No medications prior to admission.      Home: Home Living Family/patient  expects to be discharged to:: Private residence Living Arrangements: Other (Comment) Available Help at Discharge: Family, Available 24  hours/day Type of Home: House Home Access: Stairs to enter [employer] of Steps: 3 Entrance Stairs-Rails: None Home Layout: One level Bathroom Shower/Tub: Armed forces operational officer Accessibility: No Additional Comments: sister planning to provide 24/7   Functional History:    Functional Status:  Mobility: Bed Mobility Overal bed mobility: Needs Assistance Bed Mobility: Supine to Sit Rolling: Max assist Supine to sit: Max assist, HOB elevated Sit to supine: Max assist, +2 for safety/equipment General bed mobility comments: max A, pt able to ove extremeties with cues to assist Transfers Overall transfer level: Needs assistance Equipment used: 1 person hand held assist Transfers: Sit to/from Stand, Bed to chair/wheelchair/BSC Sit to Stand: Total assist Bed to/from chair/wheelchair/BSC transfer type:: Squat pivot Squat pivot transfers: Max assist Transfer via Lift Equipment: Maximove General transfer comment: pt does initiate sit to stand but unable to extend hips with PT verbal cues      ADL: ADL Overall ADL's : Needs assistance/impaired Eating/Feeding: Maximal assistance Eating/Feeding Details (indicate cue type and reason): dsy 1 Grooming: Maximal assistance Grooming Details (indicate cue type and reason): cues for sequencing, hand over hand Upper Body Bathing: Maximal assistance, Bed level Lower Body Bathing: Total assistance, +2 for safety/equipment, +2 for physical assistance, Bed level Upper Body Dressing : Maximal assistance, Bed level Lower Body Dressing: Total assistance, +2 for physical assistance, +2 for safety/equipment, Bed level Toilet Transfer: Total assistance Toileting- Clothing Manipulation and Hygiene: Total assistance Functional mobility during ADLs: Maximal assistance, +2 for physical assistance, +2 for safety/equipment General ADL Comments: focused on activity tolerance, grooming and BUE  exercises  Cognition: Cognition Overall Cognitive Status: Difficult to assess Arousal/Alertness: Awake/alert Orientation Level: Oriented to person, Disoriented to place, Disoriented to time, Disoriented to situation Attention: Focused Focused Attention: Impaired Focused Attention Impairment: Verbal basic Cognition Arousal/Alertness: Awake/alert Behavior During Therapy: Flat affect Overall Cognitive Status: Difficult to assess Area of Impairment: Problem solving Current Attention Level: Focused Following Commands: Follows one step commands with increased time Problem Solving: Slow processing, Decreased initiation, Difficulty sequencing, Requires verbal cues General Comments: following some one step commands with increased time. shaking head for yes/no accurately. requires encouragement for participation Difficult to assess due to: Impaired communication  Physical Exam: Blood pressure 136/84, pulse 92, temperature 98.8 F (37.1 C), temperature source Axillary, resp. rate 20, height 5\' 4"  (1.626 m), weight 79.8 kg, SpO2 100 %. Physical Exam  Results for orders placed or performed during the hospital encounter of 01/20/22 (from the past 48 hour(s))  Glucose, capillary     Status: Abnormal   Collection Time: 03/04/22 11:18 AM  Result Value Ref Range   Glucose-Capillary 143 (H) 70 - 99 mg/dL    Comment: Glucose reference range applies only to samples taken after fasting for at least 8 hours.  Glucose, capillary     Status: None   Collection Time: 03/04/22  3:11 PM  Result Value Ref Range   Glucose-Capillary 91 70 - 99 mg/dL    Comment: Glucose reference range applies only to samples taken after fasting for at least 8 hours.  Glucose, capillary     Status: Abnormal   Collection Time: 03/04/22  7:48 PM  Result Value Ref Range   Glucose-Capillary 135 (H) 70 - 99 mg/dL    Comment: Glucose reference range applies only to samples taken after fasting for at least 8 hours.  Glucose,  capillary  Status: Abnormal   Collection Time: 03/04/22 11:15 PM  Result Value Ref Range   Glucose-Capillary 151 (H) 70 - 99 mg/dL    Comment: Glucose reference range applies only to samples taken after fasting for at least 8 hours.  Glucose, capillary     Status: Abnormal   Collection Time: 03/05/22  3:51 AM  Result Value Ref Range   Glucose-Capillary 123 (H) 70 - 99 mg/dL    Comment: Glucose reference range applies only to samples taken after fasting for at least 8 hours.  CBC     Status: Abnormal   Collection Time: 03/05/22  3:53 AM  Result Value Ref Range   WBC 8.2 4.0 - 10.5 K/uL   RBC 4.24 3.87 - 5.11 MIL/uL   Hemoglobin 11.4 (L) 12.0 - 15.0 g/dL   HCT 16.134.3 (L) 09.636.0 - 04.546.0 %   MCV 80.9 80.0 - 100.0 fL   MCH 26.9 26.0 - 34.0 pg   MCHC 33.2 30.0 - 36.0 g/dL   RDW 40.915.6 (H) 81.111.5 - 91.415.5 %   Platelets 230 150 - 400 K/uL   nRBC 0.0 0.0 - 0.2 %    Comment: Performed at [facility], [address] [place], [place] [address]  Basic metabolic panel     Status: Abnormal   Collection Time: 03/05/22  3:53 AM  Result Value Ref Range   Sodium 143 135 - 145 mmol/L   Potassium 4.1 3.5 - 5.1 mmol/L   Chloride 110 98 - 111 mmol/L   CO2 27 22 - 32 mmol/L   Glucose, Bld 135 (H) 70 - 99 mg/dL    Comment: Glucose reference range applies only to samples taken after fasting for at least 8 hours.   BUN 18 6 - 20 mg/dL   Creatinine, Ser 6.210.74 0.44 - 1.00 mg/dL   Calcium 9.8 8.9 - 30.810.3 mg/dL   GFR, Estimated >65>60 >78>60 mL/min    Comment: (NOTE) Calculated using the CKD-EPI Creatinine Equation (2021)    Anion gap 6 5 - 15    Comment: Performed at [facility], [address] [place], [place] [address]  Glucose, capillary     Status: Abnormal   Collection Time: 03/05/22  7:43 AM  Result Value Ref Range   Glucose-Capillary 133 (H) 70 - 99 mg/dL    Comment: Glucose reference range applies only to samples taken after fasting for at least 8 hours.  Glucose, capillary     Status:  Abnormal   Collection Time: 03/05/22 11:16 AM  Result Value Ref Range   Glucose-Capillary 186 (H) 70 - 99 mg/dL    Comment: Glucose reference range applies only to samples taken after fasting for at least 8 hours.  Glucose, capillary     Status: Abnormal   Collection Time: 03/05/22  1:23 PM  Result Value Ref Range   Glucose-Capillary 186 (H) 70 - 99 mg/dL    Comment: Glucose reference range applies only to samples taken after fasting for at least 8 hours.  Glucose, capillary     Status: Abnormal   Collection Time: 03/05/22  3:41 PM  Result Value Ref Range   Glucose-Capillary 118 (H) 70 - 99 mg/dL    Comment: Glucose reference range applies only to samples taken after fasting for at least 8 hours.  Glucose, capillary     Status: Abnormal   Collection Time: 03/05/22  7:49 PM  Result Value Ref Range   Glucose-Capillary 145 (H) 70 - 99 mg/dL  Comment: Glucose reference range applies only to samples taken after fasting for at least 8 hours.  Glucose, capillary     Status: Abnormal   Collection Time: 03/05/22  8:48 PM  Result Value Ref Range   Glucose-Capillary 174 (H) 70 - 99 mg/dL    Comment: Glucose reference range applies only to samples taken after fasting for at least 8 hours.  Glucose, capillary     Status: Abnormal   Collection Time: 03/05/22 11:15 PM  Result Value Ref Range   Glucose-Capillary 146 (H) 70 - 99 mg/dL    Comment: Glucose reference range applies only to samples taken after fasting for at least 8 hours.  Glucose, capillary     Status: Abnormal   Collection Time: 03/06/22  3:13 AM  Result Value Ref Range   Glucose-Capillary 134 (H) 70 - 99 mg/dL    Comment: Glucose reference range applies only to samples taken after fasting for at least 8 hours.  Glucose, capillary     Status: None   Collection Time: 03/06/22  7:51 AM  Result Value Ref Range   Glucose-Capillary 89 70 - 99 mg/dL    Comment: Glucose reference range applies only to samples taken after fasting for  at least 8 hours.   Comment 1 Notify RN    Comment 2 Document in Chart    No results found.    Blood pressure 136/84, pulse 92, temperature 98.8 F (37.1 C), temperature source Axillary, resp. rate 20, height 5\' 4"  (1.626 m), weight 79.8 kg, SpO2 100 %.  Medical Problem List and Plan: 1. Functional deficits secondary to acute bilateral ischemic stroke, encephalopathy with respiratory failure  -patient may *** shower  -ELOS/Goals: *** 2.  Antithrombotics: -DVT/anticoagulation:  {VTE PROPHYLAXIS/ANTICOAGULATION - [id number]  -antiplatelet therapy: Plavix 3. Pain Management: Tylenol as needed 4. Mood: LCSW to evaluate and provide emotional support  -antipsychotic agents: n/a 5. Neuropsych: This patient *** capable of making decisions on *** own behalf. 6. Skin/Wound Care: Routine skin care checks 7. 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

## 2022-03-06 NOTE — Progress Notes (Signed)
CSW sent patient's clinicals out to local SNF facilities for review.  Edwin Dada, MSW, LCSW Transitions of Care  Clinical Social Worker II [phone]

## 2022-03-06 NOTE — Progress Notes (Incomplete)
Inpatient Rehabilitation Admission Medication Review by a Pharmacist  A complete drug regimen review was completed for this patient to identify any potential clinically significant medication issues.  High Risk Drug Classes Is patient taking? Indication by Medication  Antipsychotic Yes Compazine- N/V  Anticoagulant No   Antibiotic No   Opioid No   Antiplatelet Yes Plavix- CVA prophylaxis  Hypoglycemics/insulin Yes Insulin- T2DM  Vasoactive Medication Yes Norvasc, hydralazine, propranolol- hypertension  Chemotherapy No   Other Yes Crestor- HLD Trazodone- sleep Robaxin- muscle spasms Gabapentin- neuropathic pain Amantadine- acute metabolic encephalopathy Thiamine- suspected Wernicke's encephalopathy 2/2 chronic ETOH ingestion     Type of Medication Issue Identified Description of Issue Recommendation(s)  Drug Interaction(s) (clinically significant)     Duplicate Therapy     Allergy     No Medication Administration End Date     Incorrect Dose     Additional Drug Therapy Needed     Significant med changes from prior encounter (inform family/care partners about these prior to discharge).    Other  No PTA meds     Clinically significant medication issues were identified that warrant physician communication and completion of prescribed/recommended actions by midnight of the next day:  No  Time spent performing this drug regimen review (minutes):  30   Korbyn Vanes BS, PharmD, BCPS Clinical Pharmacist 03/06/2022 1:04 PM  Contact: [phone] after 3 PM  "Be curious, not judgmental..." -Debbora Dus

## 2022-03-06 NOTE — Progress Notes (Signed)
Physical Therapy Treatment Patient Details Name: Cindy Brooks MRN: [id number] DOB: 03-10-1971 Today's Date: 03/06/2022   History of Present Illness Pt is a 51 y.o. female with history of HTN, TIA, ETOH abuse and medication noncompliance presenting to the ED after she was found by her family to be unresponsive in her car on 4/8. 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 Intubated 4/9. Tracheostormy 4/16.  Trach capped 5/16.    PT Comments    Pt remains limited by slowed processing and what appears to be fatigue this session. Pt with increased time to respond, intermittently nods head with less frequent verbalization. Pt requires significant assist to perform all functional mobility activities, with increased assistance noted for bed mobility. Pt will benefit from continued acute PT services in an effort to reduce caregiver burden. PT updates recommendations to SNF placement.   Recommendations for follow up therapy are one component of a multi-disciplinary discharge planning process, led by the attending physician.  Recommendations may be updated based on patient status, additional functional criteria and insurance authorization.  Follow Up Recommendations  Skilled nursing-short term rehab (<3 hours/day)     Assistance Recommended at Discharge Frequent or constant Supervision/Assistance  Patient can return home with the following Two people to help with walking and/or transfers;Two people to help with bathing/dressing/bathroom   Equipment Recommendations  Hospital bed;Wheelchair (measurements PT);Wheelchair cushion (measurements PT);Other (comment)    Recommendations for Other Services       Precautions / Restrictions Precautions Precautions: Fall Precaution Comments: PEG, stoma Restrictions Weight Bearing  Restrictions: No     Mobility  Bed Mobility Overal bed mobility: Needs Assistance Bed Mobility: Supine to Sit, Rolling, Sit to Supine Rolling: Max assist   Supine to sit: Total assist, HOB elevated Sit to supine: Total assist, HOB elevated        Transfers Overall transfer level: Needs assistance Equipment used: 1 person hand held assist Transfers: Sit to/from Stand Sit to Stand: Max assist, Total assist, From elevated surface           General transfer comment: pt requires totalA for initial stand, initiates better with 2nd attempt although still requiring maxA    Ambulation/Gait                   Stairs             Wheelchair Mobility    Modified Rankin (Stroke Patients Only) Modified Rankin (Stroke Patients Only) Pre-Morbid Rankin Score: No symptoms Modified Rankin: Severe disability     Balance Overall balance assessment: Needs assistance Sitting-balance support: No upper extremity supported, Feet supported Sitting balance-Leahy Scale: Fair     Standing balance support: Bilateral upper extremity supported, Reliant on assistive device for balance Standing balance-Leahy Scale: Zero Standing balance comment: totalA                            Cognition Arousal/Alertness: Awake/alert Behavior During Therapy: Flat affect Overall Cognitive Status: Impaired/Different from baseline Area of Impairment: Following commands, Safety/judgement, Awareness, Problem solving                   Current Attention Level: Focused   Following Commands: Follows one step commands with increased time Safety/Judgement: Decreased awareness of safety, Decreased awareness of deficits Awareness: Intellectual Problem Solving: Slow processing, Decreased initiation, Difficulty sequencing, Requires verbal  cues General Comments: pt follows commands with increased time, does require increased time near end of session. Pt intermittently responds to  questions with head nods and rarely with speech        Exercises General Exercises - Lower Extremity Ankle Circles/Pumps: AROM, Both, 10 reps Long Arc Quad: AROM, Both, 10 reps Hip Flexion/Marching: AROM, Both, 10 reps    General Comments General comments (skin integrity, edema, etc.): VSS on RA      Pertinent Vitals/Pain Pain Assessment Pain Assessment: Faces Faces Pain Scale: Hurts even more Pain Location: RLE with movement Pain Descriptors / Indicators: Grimacing Pain Intervention(s): Monitored during session    Home Living                          Prior Function            PT Goals (current goals can now be found in the care plan section) Acute Rehab PT Goals Patient Stated Goal: unable to state Progress towards PT goals: Not progressing toward goals - comment (slowed processing, continued profound weakness)    Frequency    Min 3X/week      PT Plan Discharge plan needs to be updated;Frequency needs to be updated    Co-evaluation              AM-PAC PT "6 Clicks" Mobility   Outcome Measure  Help needed turning from your back to your side while in a flat bed without using bedrails?: A Lot Help needed moving from lying on your back to sitting on the side of a flat bed without using bedrails?: A Lot Help needed moving to and from a bed to a chair (including a wheelchair)?: Total Help needed standing up from a chair using your arms (e.g., wheelchair or bedside chair)?: Total Help needed to walk in hospital room?: Total Help needed climbing 3-5 steps with a railing? : Total 6 Click Score: 8    End of Session   Activity Tolerance: Patient tolerated treatment well Patient left: in bed;with call bell/phone within reach;with bed alarm set;with family/visitor present Nurse Communication: Mobility status;Need for lift equipment PT Visit Diagnosis: Other abnormalities of gait and mobility (R26.89);Other symptoms and signs involving the nervous  system (R29.898)     Time: [id number] PT Time Calculation (min) (ACUTE ONLY): 40 min  Charges:  $Therapeutic Exercise: 8-22 mins $Therapeutic Activity: 23-37 mins                     Cindy Brooks, PT, DPT Acute Rehabilitation Pager: [phone] Office Cindy Brooks 03/06/2022, 3:45 PM

## 2022-03-06 NOTE — Progress Notes (Signed)
PROGRESS NOTE  Cindy Brooks  [id number] DOB: [birth year] DOA: 01/20/2022 PCP: Pcp, No   Brief Narrative:  Patient is a 51 year old female with history of uncontrolled hypertension, history of TIA who presented to the [facility] emergency department on 4/9 after being found unresponsive by his family.  Patient was found to be obtunded with copious secretions, subsequently intubated on 4/9 for airway protection.  Hospital course remarkable for uncontrolled hypertension, work-up also showed thalamic stroke.  Wernicke's encephalopathy also suspected.  Transferred to [facility] service.    PCCM was following ,she has been de-cannulated on 5/19.  Pending placement.  Important events:  4/9 Admit  4/12 ongoing high dose thiamine for possible Wernike's encephalopathy  4/13 IV sedation stopped, phenobarbital wean 4/17 still not waking up. Trach placed.  4/18 PEG placed. Spiking fever. Lactate spiked. Got fluid. Cleared.  4/22 Tolerated 6hrs of ATC day prior. No major events overnight, appears that stroke had [place] meeting with family 4/21 with no decision made 4/23 No issues overnight, on vent this am, remains unresponsive  4/27 changed to propranolol from metoprolol for neuro-storming. Started on vanc and cefepime for possible HCAP  4/28 trial on open ended trach collar 5/2 patient transferred to Hospitalist Service.  5/14 waking up and following simple commands. 5/15 downsized tracheostomy to #4 5/16 tracheostomy capping trials. 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  Multifactorial: Thalamic stroke, Warnicke's encephalopathy.  Monitor mental status. MRI brain on 4/11 showed patchy restricted diffusion on both thalami and midbrain.CT angiogram head and neck 4/11, severe bilateral P1 and right P2 PCA stenosis, bilateral ophthalmic infarct, severe left proximal M2 MCA branch stenosis, severe right A2 ACA branch stenosis, no evidence of dural venous thrombosis. Warnicke's encephalopathy was suspected and he was given high-dose thiamine.  Continue oral thiamine.  Patient was also on phenobarbital which was weaned off. Status post trach, PEG placement.  Remains on amantadine, gabapentin.  Acute hypoxic respiratory failure: Intubated on presentation for airway protection.  Prolonged ventilator use/tracheostomy.  Hospital course also remarkable for healthcare associated pneumonia, treated with antibiotics, completed the course.  Repeat chest x-ray showed chronic atelectasis.  No evidence of new findings.  PCCM were following ,decannulated on 5/19  Thalamic stroke: MRI as above.  Positive anticardiolipin IgM, lupus anticoagulant: Neurology was following and recommended Plavix.  Dysphagia: Status post PEG.  Continue tube feeding.  Speech therapy closely following, recommending dysphagia 1 diet  Hypertension: Currently blood pressure stable.  On amlodipine, propanolol, hydralazine  History of hyperlipidemia: On Crestor  Obesity: BMI of 30.3  Debility/Disposition: Patient seen by PT and OT and recommended CIR. Waiting for insurance auth.  Already prolonged hospitalization.  Hard to place.  TOC following          Nutrition Problem: Inadequate oral intake Etiology: inability to eat     DVT prophylaxis:SCDs Start: 01/20/22 1012     Code Status: Full Code  Family Communication: None at bedside  Patient status:Inpatient  Patient is from :Home  Anticipated discharge  to:SnF/CIR  Estimated DC date:Not sure.  Long  length of stay   Consultants: PCCM  Procedures: Intubation, extubation, trach, PEG  Antimicrobials:  Anti-infectives (From admission, onward)    Start     Dose/Rate Route Frequency Ordered Stop   02/08/22 1800  vancomycin (VANCOREADY) IVPB 1500 mg/300 mL  Status:  Discontinued        1,500 mg 150 mL/hr over 120 Minutes Intravenous Every 24 hours 02/07/22 1712 02/09/22 1046   02/07/22 1800  vancomycin (VANCOREADY) IVPB 1750 mg/350 mL        1,750 mg 175 mL/hr over 120 Minutes Intravenous  Once 02/07/22 1708 02/07/22 2046   02/07/22 1700  ceFEPIme (MAXIPIME) 2 g in sodium chloride 0.9 % 100 mL IVPB        2 g 200 mL/hr over 30 Minutes Intravenous Every 8 hours 02/07/22 1706 02/14/22 0856   01/20/22 2000  cefTRIAXone (ROCEPHIN) 2 g in sodium chloride 0.9 % 100 mL IVPB        2 g 200 mL/hr over 30 Minutes Intravenous Every 24 hours 01/20/22 0634 01/24/22 2022   01/20/22 0415  cefTRIAXone (ROCEPHIN) 2 g in sodium chloride 0.9 % 100 mL IVPB        2 g 200 mL/hr over 30 Minutes Intravenous  Once 01/20/22 0414 01/20/22 0537       Subjective:  Patient seen and examined at the bedside this morning.  Hemodynamically stable.  Comfortable ,without any complaints today.  No changes from yesterday  Objective: Vitals:   03/06/22 0312 03/06/22 0500 03/06/22 0537 03/06/22 0722  BP: (!) 150/91  (!) 147/89 136/84  Pulse: 85   92  Resp: (!) 24   20  Temp: 98.5 F (36.9 C)   98.8 F (37.1 C)  TempSrc: Axillary   Axillary  SpO2: 99%   100%  Weight:  79.8 kg    Height:        Intake/Output Summary (Last 24 hours) at 03/06/2022 1137 Last data filed at 03/06/2022 0323 Gross per 24 hour  Intake 0 ml  Output 1150 ml  Net -1150 ml   Filed Weights   03/04/22 0500 03/05/22 0500 03/06/22 0500  Weight: 79.8 kg 79.8 kg 79.8 kg    Examination:  General exam: Overall comfortable, not in distress, deconditioned, weak HEENT: Left eye  closed Respiratory system:  no wheezes or crackles  Cardiovascular system: S1 & S2 heard, RRR.  Gastrointestinal system: Abdomen is nondistended, soft and nontender.  PEG Central nervous system: Alert and awake, generalized weakness, follows commands, dysarthria/aphasia Extremities: No edema, no clubbing ,no cyanosis Skin: No rashes, no ulcers,no icterus    Data Reviewed: I have personally reviewed following labs and imaging studies  CBC: Recent Labs  Lab 03/05/22 0353  WBC 8.2  HGB 11.4*  HCT 34.3*  MCV 80.9  PLT 230    Basic Metabolic Panel: Recent Labs  Lab 03/05/22 0353  NA 143  K 4.1  CL 110  CO2 27  GLUCOSE 135*  BUN 18  CREATININE 0.74  CALCIUM 9.8      No results found for this or any previous visit (from the past 240 hour(s)).    Radiology Studies: No results found.  Scheduled Meds:  amantadine  100 mg Per Tube BID   amLODipine  10 mg Per Tube Daily   Chlorhexidine Gluconate Cloth  6 each Topical Daily   clopidogrel  75 mg Per Tube Daily   docusate  100 mg Per Tube BID   feeding supplement  237 mL Oral BID WC  feeding supplement (JEVITY 1.5 CAL/FIBER)  1,000 mL Per Tube [id number]   folic acid  1 mg Per Tube Daily   free water  250 mL Per Tube Q6H   gabapentin  100 mg Per Tube Q12H   glycopyrrolate  1 mg Per Tube BID   guaiFENesin  10 mL Per Tube Q6H   hydrALAZINE  50 mg Per Tube Q8H   insulin aspart  0-15 Units Subcutaneous Q4H   insulin aspart  4 Units Subcutaneous Q4H   multivitamin with minerals  1 tablet Per Tube Daily   propranolol  40 mg Per Tube TID   rosuvastatin  40 mg Per Tube Daily   senna  1 tablet Per Tube Daily   thiamine  100 mg Per Tube Daily   Continuous Infusions:  sodium chloride Stopped (02/11/22 0928)     LOS: 45 days   Shelly Coss, MD [facility] P5/24/2023, 11:37 AM

## 2022-03-07 LAB — GLUCOSE, CAPILLARY
Glucose-Capillary: 132 mg/dL — ABNORMAL HIGH (ref 70–99)
Glucose-Capillary: 145 mg/dL — ABNORMAL HIGH (ref 70–99)
Glucose-Capillary: 164 mg/dL — ABNORMAL HIGH (ref 70–99)
Glucose-Capillary: 165 mg/dL — ABNORMAL HIGH (ref 70–99)
Glucose-Capillary: 172 mg/dL — ABNORMAL HIGH (ref 70–99)
Glucose-Capillary: 78 mg/dL (ref 70–99)
Glucose-Capillary: 91 mg/dL (ref 70–99)

## 2022-03-07 NOTE — Progress Notes (Signed)
PROGRESS NOTE    Cindy Brooks  [id number] DOB: 03/24/1971 DOA: 01/20/2022 PCP: Pcp, No   Brief Narrative:    Patient is a 51 year old female with history of uncontrolled hypertension, history of TIA who presented to the [facility] emergency department on 4/9 after being found unresponsive by his family.  Patient was found to be obtunded with copious secretions, subsequently intubated on 4/9 for airway protection.  Hospital course remarkable for uncontrolled hypertension, work-up also showed thalamic stroke.  Wernicke's encephalopathy also suspected.  Transferred to [facility] service.    PCCM was following ,she has been de-cannulated on 5/19.  Pending placement.   Important events:   4/9 Admit  4/12 ongoing high dose thiamine for possible Wernike's encephalopathy  4/13 IV sedation stopped, phenobarbital wean 4/17 still not waking up. Trach placed.  4/18 PEG placed. Spiking fever. Lactate spiked. Got fluid. Cleared.  4/22 Tolerated 6hrs of ATC day prior. No major events overnight, appears that stroke had [place] meeting with family 4/21 with no decision made 4/23 No issues overnight, on vent this am, remains unresponsive  4/27 changed to propranolol from metoprolol for neuro-storming. Started on vanc and cefepime for possible HCAP  4/28 trial on open ended trach collar 5/2 patient transferred to Hospitalist Service.  5/14 waking up and following simple commands. 5/15 downsized tracheostomy to #4 5/16 tracheostomy capping trials. 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  Multifactorial: Thalamic stroke, Warnicke's encephalopathy.  Monitor mental status. MRI brain on 4/11 showed patchy restricted diffusion on both thalami and midbrain.CT angiogram head and neck 4/11, severe bilateral P1 and right P2 PCA stenosis, bilateral ophthalmic infarct, severe left proximal M2 MCA branch stenosis, severe right A2 ACA branch stenosis, no evidence of dural venous thrombosis. Warnicke's encephalopathy was suspected and he was given high-dose thiamine.  Continue oral thiamine.  Patient was also on phenobarbital which was weaned off. Status post trach, PEG placement.  Remains on amantadine, gabapentin.   Acute hypoxic respiratory failure: Intubated on presentation for airway protection.  Prolonged ventilator use/tracheostomy.  Hospital course also remarkable for healthcare associated pneumonia, treated with antibiotics, completed the course.  Repeat chest x-ray showed chronic atelectasis.  No evidence of new findings.  PCCM were following ,decannulated on 5/19   Thalamic stroke: MRI as above.  Positive anticardiolipin IgM, lupus anticoagulant: Neurology was following and recommended Plavix.   Dysphagia: Status post PEG.  Continue tube feeding.  Speech therapy closely following, recommending dysphagia 1 diet   Hypertension: Currently blood pressure stable.  On amlodipine, propanolol, hydralazine   History of hyperlipidemia: On Crestor   Obesity: BMI of 30.3   Debility/Disposition: Patient seen by PT and OT and recommended CIR. Waiting for insurance auth.  Already prolonged hospitalization.  Hard to place.  TOC following     DVT prophylaxis:SCDs Code Status: Full Family Communication: None at bedside Disposition Plan:  Status is: Inpatient Remains inpatient appropriate because: Needs placement.  Consultants:  PCCM  Procedures:   Intubation/extubation/trach/PEG  Antimicrobials:  Anti-infectives (From admission, onward)  Start     Dose/Rate Route Frequency Ordered Stop   02/08/22 1800  vancomycin (VANCOREADY) IVPB 1500 mg/300 mL  Status:  Discontinued        1,500 mg 150 mL/hr over 120 Minutes Intravenous Every 24 hours 02/07/22 1712 02/09/22 1046   02/07/22 1800  vancomycin (VANCOREADY) IVPB 1750 mg/350 mL        1,750 mg 175 mL/hr over 120 Minutes Intravenous  Once 02/07/22 1708 02/07/22 2046   02/07/22 1700  ceFEPIme (MAXIPIME) 2 g in sodium chloride 0.9 % 100 mL IVPB        2 g 200 mL/hr over 30 Minutes Intravenous Every 8 hours 02/07/22 1706 02/14/22 0856   01/20/22 2000  cefTRIAXone (ROCEPHIN) 2 g in sodium chloride 0.9 % 100 mL IVPB        2 g 200 mL/hr over 30 Minutes Intravenous Every 24 hours 01/20/22 0634 01/24/22 2022   01/20/22 0415  cefTRIAXone (ROCEPHIN) 2 g in sodium chloride 0.9 % 100 mL IVPB        2 g 200 mL/hr over 30 Minutes Intravenous  Once 01/20/22 0414 01/20/22 0537       Subjective: Patient seen and evaluated today with no new acute complaints or concerns. No acute concerns or events noted overnight.  Objective: Vitals:   03/07/22 0315 03/07/22 0340 03/07/22 0441 03/07/22 0739  BP:  135/90  (!) 153/99  Pulse:  90  93  Resp: 19 (!) 26  20  Temp:  98.4 F (36.9 C)  98.4 F (36.9 C)  TempSrc:  Axillary  Oral  SpO2:  97%  96%  Weight:   80.8 kg   Height:        Intake/Output Summary (Last 24 hours) at 03/07/2022 0822 Last data filed at 03/07/2022 0059 Gross per 24 hour  Intake --  Output 1000 ml  Net -1000 ml   Filed Weights   03/05/22 0500 03/06/22 0500 03/07/22 0441  Weight: 79.8 kg 79.8 kg 80.8 kg    Examination:  General exam: Appears calm and comfortable  Respiratory system: Clear to auscultation. Respiratory effort normal. Cardiovascular system: S1 & S2 heard, RRR.  Gastrointestinal system: Abdomen is soft Central nervous system: Alert and  awake Extremities: No edema Skin: No significant lesions noted Psychiatry: Flat affect.    Data Reviewed: I have personally reviewed following labs and imaging studies  CBC: Recent Labs  Lab 03/05/22 0353  WBC 8.2  HGB 11.4*  HCT 34.3*  MCV 80.9  PLT 123456   Basic Metabolic Panel: Recent Labs  Lab 03/05/22 0353  NA 143  K 4.1  CL 110  CO2 27  GLUCOSE 135*  BUN 18  CREATININE 0.74  CALCIUM 9.8   GFR: Estimated Creatinine Clearance: 86.5 mL/min (by C-G formula based on SCr of 0.74 mg/dL). Liver Function Tests: No results for input(s): AST, ALT, ALKPHOS, BILITOT, PROT, ALBUMIN in the last 168 hours. No results for input(s): LIPASE, AMYLASE in the last 168 hours. No results for input(s): AMMONIA in the last 168 hours. Coagulation Profile: No results for input(s): INR, PROTIME in the last 168 hours. Cardiac Enzymes: No results for input(s): CKTOTAL, CKMB, CKMBINDEX, TROPONINI in the last 168 hours. BNP (last 3 results) No results for input(s): PROBNP in the last 8760 hours. HbA1C: No results for input(s): HGBA1C in the last 72 hours. CBG: Recent Labs  Lab 03/06/22 1613 03/06/22 1926 03/07/22 0052 03/07/22 0339 03/07/22 0736  GLUCAP 112* 164* 132* 145* 91   Lipid  Profile: No results for input(s): CHOL, HDL, LDLCALC, TRIG, CHOLHDL, LDLDIRECT in the last 72 hours. Thyroid Function Tests: No results for input(s): TSH, T4TOTAL, FREET4, T3FREE, THYROIDAB in the last 72 hours. Anemia Panel: No results for input(s): VITAMINB12, FOLATE, FERRITIN, TIBC, IRON, RETICCTPCT in the last 72 hours. Sepsis Labs: No results for input(s): PROCALCITON, LATICACIDVEN in the last 168 hours.  No results found for this or any previous visit (from the past 240 hour(s)).       Radiology Studies: No results found.      Scheduled Meds:  amantadine  100 mg Per Tube BID   amLODipine  10 mg Per Tube Daily   Chlorhexidine Gluconate Cloth  6 each Topical Daily   clopidogrel   75 mg Per Tube Daily   docusate  100 mg Per Tube BID   feeding supplement  237 mL Oral BID WC   feeding supplement (JEVITY 1.5 CAL/FIBER)  1,000 mL Per Tube [id number]   folic acid  1 mg Per Tube Daily   free water  250 mL Per Tube Q6H   gabapentin  100 mg Per Tube Q12H   glycopyrrolate  1 mg Per Tube BID   guaiFENesin  10 mL Per Tube Q6H   hydrALAZINE  50 mg Per Tube Q8H   insulin aspart  0-15 Units Subcutaneous Q4H   multivitamin with minerals  1 tablet Per Tube Daily   propranolol  40 mg Per Tube TID   rosuvastatin  40 mg Per Tube Daily   senna  1 tablet Per Tube Daily   thiamine  100 mg Per Tube Daily   Continuous Infusions:  sodium chloride Stopped (02/11/22 0928)     LOS: 46 days    Time spent: 35 minutes    Cindy Brooks Darleen Crocker, DO [facility]  If 7PM-7AM, please contact night-coverage [email or web address] 03/07/2022, 8:22 AM

## 2022-03-07 NOTE — Progress Notes (Signed)
Pt having abdominal tightness/distention. No complaints or pain or guarding when palpating/pressing abdomen. Pt has been moving bowels and urinating. NT bladder scanned pt anyway and it showed >999 mL. Pt was in and outed and 2,050 mL of clear yellow urine was removed. Pt abdomen now soft. MD, Manuella Ghazi, was text-paged. This RN also made nightshift RN aware to be able to monitor through the night.  Justice Rocher, RN

## 2022-03-07 NOTE — Progress Notes (Addendum)
2:30pm: CSW spoke with patient's daughter [name] who states she is agreeable for her mother to come home at discharge but is concerned about PEG care. CSW informed [name] that a referral was made to the [facility] program and the details of that program were explained.  CSW spoke with patient's RN who states patient is receiving nocturnal tube feeds and her diet has been advanced.  CSW spoke with MD regarding PEG - MD states he is unsure of if the PEG will be permanent yet or not. 1:30pm: CSW spoke with Tommi Rumps at [facility] to request he review patient for [facility] services. [facility] is able to accept patient after they are established with a PCP.  CSW placed request for patient to be added to [facility] program.  12:30pm: CSW spoke with Neoma Laming who states the patient's insurance policy requires a $9,037 deductible be met prior to the plan covering SNF days at 50%.  CSW spoke with patient's daughter [name] to inform her of information. [name] will return call to [place].  10:30am: CSW spoke with Neoma Laming at [facility] who states she can offer the patient a bed. Neoma Laming to speak with patient's daughter about a tour and initiate insurance authorization.  CSW spoke with [place] at [facility] who states the facility cannot offer the patient a bed.  CSW spoke with patient's daughter [name] to present her with bed offer - [name] is agreeable to accept bed offer from [facility]. [name] states she does not have a copy of patient's insurance card.  8:30am: CSW spoke with Maudie Mercury at [facility] regarding this patient - she is currently under review for a possible bed offer.  CSW reviewed [employer] and patient has active [employer] - this plan does not include SNF benefits. There is a pending Medicaid pending application that is being processed by DSS at this time.  Madilyn Fireman, MSW, LCSW Transitions of Care  Clinical Social Worker II [phone]

## 2022-03-07 NOTE — Progress Notes (Signed)
Physical Therapy Treatment Patient Details Name: Cindy Brooks MRN: [id number] DOB: 21-Jun-1971 Today's Date: 03/07/2022   History of Present Illness Pt is a 51 y.o. female with history of HTN, TIA, ETOH abuse and medication noncompliance presenting to the ED after she was found by her family to be unresponsive in her car on 4/8. 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 Intubated 4/9. Tracheostormy 4/16.  Trach capped 5/16.    PT Comments    Pt tolerates treatment well, improved arousal and participation. Pt is better able to activate lower extremities during transfer attempts and maintains standing for up to 10 second intervals with PT assistance. Pt will benefit from continued aggressive mobilization in an effort improve strength and reduce falls risk.   Recommendations for follow up therapy are one component of a multi-disciplinary discharge planning process, led by the attending physician.  Recommendations may be updated based on patient status, additional functional criteria and insurance authorization.  Follow Up Recommendations  Skilled nursing-short term rehab (<3 hours/day)     Assistance Recommended at Discharge Frequent or constant Supervision/Assistance  Patient can return home with the following Two people to help with walking and/or transfers;Two people to help with bathing/dressing/bathroom   Equipment Recommendations  Hospital bed;Wheelchair (measurements PT);Wheelchair cushion (measurements PT);Other (comment)    Recommendations for Other Services       Precautions / Restrictions Precautions Precautions: Fall Precaution Comments: PEG, stoma Restrictions Weight Bearing Restrictions: No     Mobility  Bed Mobility Overal bed mobility: Needs Assistance Bed Mobility: Supine to Sit     Supine to  sit: Max assist, HOB elevated     General bed mobility comments: pt with improved initiation of LE mobility, continues to require assist to pivot hips and elevate trunk    Transfers Overall transfer level: Needs assistance Equipment used: 1 person hand held assist Transfers: Sit to/from Stand, Bed to chair/wheelchair/BSC Sit to Stand: Max assist     Squat pivot transfers: Max assist     General transfer comment: pt with improved initiation of sit to stands, performing 4 sit to stands and one squat pivot. Pt activating LEs and following cues for increased trunk flexion, pushing from recliner and activates hip extensors with verbal cues    Ambulation/Gait                   Stairs             Wheelchair Mobility    Modified Rankin (Stroke Patients Only) Modified Rankin (Stroke Patients Only) Pre-Morbid Rankin Score: No symptoms Modified Rankin: Severe disability     Balance Overall balance assessment: Needs assistance Sitting-balance support: Single extremity supported, Feet supported, No upper extremity supported Sitting balance-Leahy Scale: Poor Sitting balance - Comments: posterior lean, minA-minG Postural control: Posterior lean Standing balance support: Bilateral upper extremity supported Standing balance-Leahy Scale: Poor Standing balance comment: maxA                            Cognition Arousal/Alertness: Awake/alert Behavior During Therapy: Flat affect Overall Cognitive Status: Impaired/Different from baseline Area of Impairment: Problem solving                             Problem Solving: Slow processing  Exercises      General Comments General comments (skin integrity, edema, etc.): VSS on RA      Pertinent Vitals/Pain Pain Assessment Pain Assessment: Faces Faces Pain Scale: Hurts little more Pain Location: R knee Pain Descriptors / Indicators: Grimacing Pain Intervention(s): Monitored during  session    Home Living                          Prior Function            PT Goals (current goals can now be found in the care plan section) Acute Rehab PT Goals Patient Stated Goal: unable to state Progress towards PT goals: Progressing toward goals    Frequency    Min 3X/week      PT Plan Current plan remains appropriate    Co-evaluation              AM-PAC PT "6 Clicks" Mobility   Outcome Measure  Help needed turning from your back to your side while in a flat bed without using bedrails?: A Lot Help needed moving from lying on your back to sitting on the side of a flat bed without using bedrails?: A Lot Help needed moving to and from a bed to a chair (including a wheelchair)?: A Lot Help needed standing up from a chair using your arms (e.g., wheelchair or bedside chair)?: A Lot Help needed to walk in hospital room?: Total Help needed climbing 3-5 steps with a railing? : Total 6 Click Score: 10    End of Session   Activity Tolerance: Patient tolerated treatment well Patient left: in chair;with call bell/phone within reach;with chair alarm set Nurse Communication: Mobility status;Need for lift equipment PT Visit Diagnosis: Other abnormalities of gait and mobility (R26.89);Other symptoms and signs involving the nervous system [id number])     Time: [id number] PT Time Calculation (min) (ACUTE ONLY): 23 min  Charges:  $Therapeutic Activity: 23-37 mins                     Zenaida Niece, PT, DPT Acute Rehabilitation Pager: [phone] Office [place] Jermie Hippe 03/07/2022, 9:56 AM

## 2022-03-07 NOTE — Progress Notes (Signed)
Speech Language Pathology Treatment: Dysphagia;Cognitive-Linquistic  Patient Details Name: Cindy Brooks MRN: [id number] DOB: 04-11-1971 Today's Date: 03/07/2022 Time: 1010-1030 SLP Time Calculation (min) (ACUTE ONLY): 20 min  Assessment / Plan / Recommendation Clinical Impression  Pt up in chair with dys 3 meal. Pt able to self feed with moderate physical assist for grasp on spoon and movement of hand to mouth. Pt able to masticate, but again prolonged, needed cueing and washes to initiate bolus formation and clearance. When asked pt stated that she wanted pureed foods. Will downgrade back to puree though ongoing trials of solids is recommended as pt appears to be making progress. Pt expressing want sn needs more clearing though question cue  for initiation often needed. Pt able to name 5/10 objects. Often has paraphasic length utterances with enough accuracy to decipher pts meaning.   HPI HPI: Pt is a 51 y/o female who presented to the ED after being found unresponsive in her care. Pt admitted 4/9 with thalamic stroke, treated for possible acute metabolic and wernicke's encphalopathy as well. She required intubation for airway protection on admission and had a tracheostomy placed on 4/16.  PEG on 4/18. MRI 4/19: Multiple foci of restricted diffusion in the bilateral frontal lobes and parietal lobes primarily. Started tolerating ATC on 4/22. Has suffered from severe encephalopathy, neurostorming. Started following commands around 5/15. At time of evaluation, pt with a 4 cuffless trach which is capped and may be decannulated within the next 24-48 hours. PMH: HTN, TIA, ETOH abuse and medication noncompliance.      SLP Plan  Continue with current plan of care      Recommendations for follow up therapy are one component of a multi-disciplinary discharge planning process, led by the attending physician.  Recommendations may be updated based on patient status, additional functional criteria and  insurance authorization.    Recommendations  Diet recommendations: Dysphagia 1 (puree);Thin liquid Liquids provided via: Cup;Straw Medication Administration: Crushed with puree Supervision: Full supervision/cueing for compensatory strategies Compensations: Slow rate;Small sips/bites;Minimize environmental distractions Postural Changes and/or Swallow Maneuvers: Seated upright 90 degrees                Oral Care Recommendations: Oral care BID Follow Up Recommendations: Skilled nursing-short term rehab (<3 hours/day) Assistance recommended at discharge: Frequent or constant Supervision/Assistance SLP Visit Diagnosis: Dysphagia, unspecified (R13.10) Plan: Continue with current plan of care           Juwuan Sedita, Katherene Ponto  03/07/2022, 11:05 AM

## 2022-03-07 NOTE — NC FL2 (Signed)
[place] LEVEL OF CARE SCREENING TOOL     IDENTIFICATION  Patient Name: Cindy Brooks Birthdate: [birth year] Sex: female Admission Date (Current Location): 01/20/2022  [facility] and [place] Number:  Herbalist and Address:  [facility] [place]. [facility], [place] [address] [place], [place] [address]      Provider Number: [id number]  Attending Physician Name and Address:  Rodena Goldmann, DO  Relative Name and Phone Number:  Cynthina Penado (Daughter)  [phone]    Current Level of Care: Hospital Recommended Level of Care: [place] Prior Approval Number:    Date Approved/Denied:   PASRR Number: [id number]  Discharge Plan: SNF    Current Diagnoses: Patient Active Problem List   Diagnosis Date Noted   HCAP (healthcare-associated pneumonia) 02/08/2022   Hypernatremia 02/07/2022   Coma ([place]) 02/07/2022   Fever 02/07/2022   Leukocytosis 02/07/2022   Sympathetic storming 02/07/2022   Antiphospholipid antibody with hypercoagulable state ([place]) 02/04/2022   Pressure injury of skin 02/01/2022   PEG (percutaneous endoscopic gastrostomy) status ([place]) 01/29/2022   Respiratory failure ([place]) 01/28/2022   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   Hypertension    Thalamic stroke ([place]) 01/22/2022   Acute encephalopathy 01/20/2022    Orientation RESPIRATION BLADDER Height & Weight     Self, Place  Normal External catheter Weight: 80.8 kg Height:  5\' 4"  (162.6 cm)  BEHAVIORAL SYMPTOMS/MOOD NEUROLOGICAL BOWEL NUTRITION STATUS      Incontinent Feeding tube (PEG tube - 24 Fr placed 01/28/22)  AMBULATORY STATUS COMMUNICATION OF NEEDS Skin   Extensive Assist Does not communicate Normal                       Personal Care Assistance Level of Assistance  Bathing, Feeding, Dressing, Total care Bathing Assistance: Maximum assistance Feeding assistance: Maximum assistance Dressing Assistance: Maximum assistance Total Care Assistance:  Maximum assistance   Functional Limitations Info  Sight, Hearing, Speech Sight Info: Adequate Hearing Info: Adequate Speech Info: Impaired (Trach - decannulated on 03/01/2022)    SPECIAL CARE FACTORS FREQUENCY  PT (By licensed PT), OT (By licensed OT)     PT Frequency: 5 x per week OT Frequency: 5 x per week            Contractures Contractures Info: Not present    Additional Factors Info  Code Status, Allergies, Psychotropic, Insulin Sliding Scale Code Status Info: Full code Allergies Info: Aspirin, Codeine, Penicillin, Strawberry extract   Insulin Sliding Scale Info: Insulin Moderate Sliding Scale       Current Medications (03/07/2022):  This is the current hospital active medication list Current Facility-Administered Medications  Medication Dose Route Frequency Provider Last Rate Last Admin   0.9 %  sodium chloride infusion   Intravenous PRN Rosalin Hawking, MD   Stopped at 02/11/22 [id number]   acetaminophen (TYLENOL) 160 MG/5ML solution 650 mg  650 mg Per Tube Q6H PRN Anders Simmonds, MD   650 mg at 02/25/22 2101   amantadine (SYMMETREL) 50 MG/5ML solution 100 mg  100 mg Per Tube BID Kipp Brood, MD   100 mg at 03/06/22 2122   amLODipine (NORVASC) tablet 10 mg  10 mg Per Tube Daily Jacky Kindle, MD   10 mg at 03/06/22 0908   Chlorhexidine Gluconate Cloth 2 % PADS 6 each  6 each Topical Daily Olalere, Adewale A, MD   6 each at 03/06/22 2126   clopidogrel (PLAVIX) tablet 75 mg  75 mg Per Tube  Daily Jacky Kindle, MD   75 mg at 03/06/22 0908   docusate (COLACE) 50 MG/5ML liquid 100 mg  100 mg Per Tube BID Spero Geralds, MD   100 mg at 03/06/22 2121   feeding supplement (ENSURE ENLIVE / ENSURE PLUS) liquid 237 mL  237 mL Oral BID WC Adhikari, Amrit, MD   237 mL at 03/06/22 0910   feeding supplement (JEVITY 1.5 CAL/FIBER) liquid 1,000 mL  1,000 mL Per Tube Q24H Shelly Coss, MD   1,000 mL at [id number]   folic acid (FOLVITE) tablet 1 mg  1 mg Per Tube Daily Spero Geralds,  MD   1 mg at 03/06/22 0908   free water 250 mL  250 mL Per Tube Q6H Hosie Poisson, MD   250 mL at 03/07/22 0505   gabapentin (NEURONTIN) 250 MG/5ML solution 100 mg  100 mg Per Tube Avie Arenas, Tamsen Meek, MD   100 mg at 03/06/22 2126   Gerhardt's butt cream   Topical Daily PRN Barb Merino, MD   Given at 03/01/22 0818   glycopyrrolate (ROBINUL) tablet 1 mg  1 mg Per Tube BID Shelly Coss, MD   1 mg at 03/06/22 2121   guaiFENesin (ROBITUSSIN) 100 MG/5ML liquid 10 mL  10 mL Per Tube Q6H Noemi Chapel P, DO   10 mL at 03/07/22 0440   hydrALAZINE (APRESOLINE) tablet 50 mg  50 mg Per Tube Q8H Adhikari, Amrit, MD   50 mg at 03/07/22 0504   insulin aspart (novoLOG) injection 0-15 Units  0-15 Units Subcutaneous Q4H Cecilie Lowers T, MD   2 Units at 03/07/22 0440   ipratropium-albuterol (DUONEB) 0.5-2.5 (3) MG/3ML nebulizer solution 3 mL  3 mL Nebulization Q6H PRN Hosie Poisson, MD       labetalol (NORMODYNE) injection 10 mg  10 mg Intravenous Q2H PRN Chesley Mires, MD   10 mg at 03/05/22 2112   metoprolol tartrate (LOPRESSOR) injection 2.5 mg  2.5 mg Intravenous Q6H PRN Gerald Leitz D, NP   2.5 mg at 03/05/22 0006   multivitamin with minerals tablet 1 tablet  1 tablet Per Tube Daily Spero Geralds, MD   1 tablet at 03/06/22 0908   polyethylene glycol (MIRALAX / GLYCOLAX) packet 17 g  17 g Per Tube Daily PRN Spero Geralds, MD       promethazine (PHENERGAN) 6.25 MG/5ML syrup 12.5 mg  12.5 mg Per Tube Q6H PRN Barb Merino, MD       propranolol (INDERAL) tablet 40 mg  40 mg Per Tube TID Erick Colace, NP   40 mg at 03/06/22 2122   rosuvastatin (CRESTOR) tablet 40 mg  40 mg Per Tube Daily Janine Ores, NP   40 mg at 03/06/22 0908   senna (SENOKOT) tablet 8.6 mg  1 tablet Per Tube Daily Agarwala, Einar Grad, MD   8.6 mg at 03/06/22 [id number]   thiamine tablet 100 mg  100 mg Per Tube Daily Garvin Fila, MD   100 mg at 03/06/22 0908     Discharge Medications: Please see discharge summary for a list of  discharge medications.  Relevant Imaging Results:  Relevant Lab Results:   Additional Information SS#[id number] - PEG tube present,  Trach Decannulated on 03/01/2022  Curlene Labrum, RN

## 2022-03-08 LAB — GLUCOSE, CAPILLARY
Glucose-Capillary: 134 mg/dL — ABNORMAL HIGH (ref 70–99)
Glucose-Capillary: 85 mg/dL (ref 70–99)
Glucose-Capillary: 105 mg/dL — ABNORMAL HIGH (ref 70–99)
Glucose-Capillary: 90 mg/dL (ref 70–99)
Glucose-Capillary: 74 mg/dL (ref 70–99)

## 2022-03-08 NOTE — Progress Notes (Signed)
Occupational Therapy Treatment Patient Details Name: Cindy Brooks MRN: [id number] DOB: 01-Dec-1970 Today's Date: 03/08/2022   History of present illness Pt is a 51 y.o. female with history of HTN, TIA, ETOH abuse and medication noncompliance presenting to the ED after she was found by her family to be unresponsive in her car on 4/8. 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 Intubated 4/9. Tracheostormy 4/16.  Trach capped 5/16.   OT comments  Pt. Seen for skilled OT treatment session.  Pt. Able to complete bed mobility in/out of bed with max/total assist. Once seated eob able to maintain sitting and did help initiate scooting toward hob prior to back to bed.  Grooming task with hand over hand assistance. Agree with current d/c recommendations.     Recommendations for follow up therapy are one component of a multi-disciplinary discharge planning process, led by the attending physician.  Recommendations may be updated based on patient status, additional functional criteria and insurance authorization.    Follow Up Recommendations  Acute inpatient rehab (3hours/day)    Assistance Recommended at Discharge Frequent or constant Supervision/Assistance  Patient can return home with the following  Two people to help with walking and/or transfers;Two people to help with bathing/dressing/bathroom;Assistance with cooking/housework;Assistance with feeding;Direct supervision/assist for medications management;Direct supervision/assist for financial management;Assist for transportation;Help with stairs or ramp for entrance   Equipment Recommendations  Other (comment)    Recommendations for Other Services Rehab consult    Precautions / Restrictions Precautions Precautions: Fall Precaution Comments: PEG, stoma       Mobility Bed  Mobility Overal bed mobility: Needs Assistance Bed Mobility: Supine to Sit     Supine to sit: Max assist, HOB elevated Sit to supine: Total assist, HOB elevated   General bed mobility comments: pt with improved initiation of LE mobility, continues to require assist to pivot hips and elevate trunk-able to sit in un supported sitting without any assistance or lob noted    Transfers                         Balance                                           ADL either performed or assessed with clinical judgement   ADL Overall ADL's : Needs assistance/impaired     Grooming: Maximal assistance;Wash/dry face;Bed level Grooming Details (indicate cue type and reason): cues for sequencing, hand over hand.  Noted some grimacing with trying to get washcloth to reach her face.  Limited shoulder rom and also limited grasp noted as well.  May benefit from HEP for strengthening and rom                               General ADL Comments: bed mobility in prep for increasing safety with transfers and increasing activity tolerance    Extremity/Trunk Assessment              Vision       Perception     Praxis      Cognition Arousal/Alertness: Awake/alert Behavior During Therapy: Flat affect Overall Cognitive Status: Impaired/Different from baseline Area of Impairment: Problem solving  Current Attention Level: Focused   Following Commands: Follows one step commands with increased time Safety/Judgement: Decreased awareness of safety, Decreased awareness of deficits Awareness: Intellectual Problem Solving: Slow processing General Comments: pt follows commands with increased time, does require increased time near end of session. Pt intermittently responds to questions with head nods and rarely with speech-speaking some today, answered questions with choices provided "show or a movie" when assisting her with the tv. she  said "a movie"        Exercises      Shoulder Instructions       General Comments      Pertinent Vitals/ Pain       Pain Assessment Pain Assessment: Faces Faces Pain Scale: Hurts a little bit Pain Location: left side of stomach area Pain Intervention(s): Limited activity within patient's tolerance, Monitored during session  Home Living                                          Prior Functioning/Environment              Frequency  Min 2X/week        Progress Toward Goals  OT Goals(current goals can now be found in the care plan section)  Progress towards OT goals: Progressing toward goals     Plan Discharge plan remains appropriate    Co-evaluation                 AM-PAC OT "6 Clicks" Daily Activity     Outcome Measure   Help from another person eating meals?: A Lot Help from another person taking care of personal grooming?: A Lot Help from another person toileting, which includes using toliet, bedpan, or urinal?: Total Help from another person bathing (including washing, rinsing, drying)?: Total Help from another person to put on and taking off regular upper body clothing?: A Lot Help from another person to put on and taking off regular lower body clothing?: Total 6 Click Score: 9    End of Session    OT Visit Diagnosis: Unsteadiness on feet (R26.81);Other abnormalities of gait and mobility (R26.89);Muscle weakness (generalized) (M62.81);Pain   Activity Tolerance Patient tolerated treatment well   Patient Left in bed;with call bell/phone within reach;with bed alarm set   Nurse Communication Other (comment) (rn states ok to work with pt.)        Time: 8413-2440 OT Time Calculation (min): 22 min  Charges: OT General Charges $OT Visit: 1 Visit OT Treatments $Self Care/Home Management : 8-22 mins  Boneta Lucks, COTA/L Acute Rehabilitation [phone]   Salvadore Oxford 03/08/2022, 11:59 AM

## 2022-03-08 NOTE — Progress Notes (Signed)
CSW spoke with patient's daughter [name] who is interested in obtaining a hospital bed for patient in the home if it is covered by insurance.  CSW spoke with Thedore Mins of [place] who states the agency is not in network with the patient's insurance.  CSW spoke with [place] [facility] [employer] who states the agency is not in network with the patient's insurance.  Madilyn Fireman, MSW, LCSW Transitions of Care  Clinical Social Worker II [phone]

## 2022-03-08 NOTE — Progress Notes (Signed)
PROGRESS NOTE    Cindy Brooks  [id number] DOB: [birth year] DOA: 01/20/2022 PCP: Pcp, No   Brief Narrative:    Patient is a 51 year old female with history of uncontrolled hypertension, history of TIA who presented to the [facility] emergency department on 4/9 after being found unresponsive by his family.  Patient was found to be obtunded with copious secretions, subsequently intubated on 4/9 for airway protection.  Hospital course remarkable for uncontrolled hypertension, work-up also showed thalamic stroke.  Wernicke's encephalopathy also suspected.  Transferred to [facility] service.    PCCM was following ,she has been de-cannulated on 5/19. Awaiting further evaluation from [place].  Important events:   4/9 Admit  4/12 ongoing high dose thiamine for possible Wernike's encephalopathy  4/13 IV sedation stopped, phenobarbital wean 4/17 still not waking up. Trach placed.  4/18 PEG placed. Spiking fever. Lactate spiked. Got fluid. Cleared.  4/22 Tolerated 6hrs of ATC day prior. No major events overnight, appears that stroke had [place] meeting with family 4/21 with no decision made 4/23 No issues overnight, on vent this am, remains unresponsive  4/27 changed to propranolol from metoprolol for neuro-storming. Started on vanc and cefepime for possible HCAP  4/28 trial on open ended trach collar 5/2 patient transferred to Hospitalist Service.  5/14 waking up and following simple commands. 5/15 downsized tracheostomy to #4 5/16 tracheostomy capping trials. 5/19 Decannulation 5/25 Foley placement for urinary retention  Assessment & Plan:   Principal Problem:   Thalamic stroke [facility]) Active Problems:   Acute encephalopathy   Respiratory failure (HCC)   Tracheostomy dependence (HCC)   Hypertension   PEG (percutaneous endoscopic gastrostomy) status (HCC)   Pressure injury of skin   Antiphospholipid antibody with hypercoagulable state ([place])   Hypernatremia   Coma ([place])   Fever   Leukocytosis    Sympathetic storming   HCAP (healthcare-associated pneumonia)  Assessment and Plan:   Acute metabolic encephalopathy: Unresponsive on presentation.  Multifactorial: Thalamic stroke, Warnicke's encephalopathy.  Monitor mental status. MRI brain on 4/11 showed patchy restricted diffusion on both thalami and midbrain.CT angiogram head and neck 4/11, severe bilateral P1 and right P2 PCA stenosis, bilateral ophthalmic infarct, severe left proximal M2 MCA branch stenosis, severe right A2 ACA branch stenosis, no evidence of dural venous thrombosis. Warnicke's encephalopathy was suspected and he was given high-dose thiamine.  Continue oral thiamine.  Patient was also on phenobarbital which was weaned off. Status post trach, PEG placement.  Remains on amantadine, gabapentin.   Acute hypoxic respiratory failure: Intubated on presentation for airway protection.  Prolonged ventilator use/tracheostomy.  Hospital course also remarkable for healthcare associated pneumonia, treated with antibiotics, completed the course.  Repeat chest x-ray showed chronic atelectasis.  No evidence of new findings.  PCCM were following ,decannulated on 5/19   Thalamic stroke: MRI as above.  Positive anticardiolipin IgM, lupus anticoagulant: Neurology was following and recommended Plavix.   Dysphagia: Status post PEG.  Continue tube feeding.  Speech therapy closely following, recommending dysphagia 1 diet   Hypertension: Currently blood pressure stable.  On amlodipine, propanolol, hydralazine   History of hyperlipidemia: On Crestor   Obesity: BMI of 30.3  Acute urinary retention: Foley placed 5/25.   Debility/Disposition: Patient seen by PT and OT and recommended CIR. Waiting for insurance auth.  Already prolonged hospitalization.  Hard to place.  TOC following     DVT prophylaxis:SCDs Code Status: Full Family Communication: None at bedside Disposition Plan:  Status is: Inpatient Remains inpatient appropriate because:  Needs placement and is  being evaluated by [facility] program.   Consultants:  PCCM   Procedures:  Intubation/extubation/trach/PEG   Antimicrobials:  Anti-infectives (From admission, onward)    Start     Dose/Rate Route Frequency Ordered Stop   02/08/22 1800  vancomycin (VANCOREADY) IVPB 1500 mg/300 mL  Status:  Discontinued        1,500 mg 150 mL/hr over 120 Minutes Intravenous Every 24 hours 02/07/22 1712 02/09/22 1046   02/07/22 1800  vancomycin (VANCOREADY) IVPB 1750 mg/350 mL        1,750 mg 175 mL/hr over 120 Minutes Intravenous  Once 02/07/22 1708 02/07/22 2046   02/07/22 1700  ceFEPIme (MAXIPIME) 2 g in sodium chloride 0.9 % 100 mL IVPB        2 g 200 mL/hr over 30 Minutes Intravenous Every 8 hours 02/07/22 1706 02/14/22 0856   01/20/22 2000  cefTRIAXone (ROCEPHIN) 2 g in sodium chloride 0.9 % 100 mL IVPB        2 g 200 mL/hr over 30 Minutes Intravenous Every 24 hours 01/20/22 0634 01/24/22 2022   01/20/22 0415  cefTRIAXone (ROCEPHIN) 2 g in sodium chloride 0.9 % 100 mL IVPB        2 g 200 mL/hr over 30 Minutes Intravenous  Once 01/20/22 0414 01/20/22 0537      Subjective: Patient seen and evaluated today with no new acute complaints or concerns this am. Noted to have abdominal distention with bladder scan showing over >960mL of urine and therefore foley placed.  Objective: Vitals:   03/08/22 0310 03/08/22 0500 03/08/22 0820 03/08/22 1117  BP:   139/86 126/82  Pulse:   96 85  Resp:   (!) 23 16  Temp: 99.1 F (37.3 C)  98.3 F (36.8 C) 98.4 F (36.9 C)  TempSrc: Oral  Oral Oral  SpO2:   95% 96%  Weight:  80.3 kg    Height:        Intake/Output Summary (Last 24 hours) at 03/08/2022 1506 Last data filed at 03/08/2022 1002 Gross per 24 hour  Intake 240 ml  Output 3250 ml  Net -3010 ml   Filed Weights   03/06/22 0500 03/07/22 0441 03/08/22 0500  Weight: 79.8 kg 80.8 kg 80.3 kg    Examination:  General exam: Appears calm and comfortable  Respiratory system:  Clear to auscultation. Respiratory effort normal. Cardiovascular system: S1 & S2 heard, RRR.  Gastrointestinal system: Abdomen is soft, PEG c/d/i Central nervous system: Alert and awake Extremities: No edema Skin: No significant lesions noted Psychiatry: Flat affect. Foley with clear, yellow, UO    Data Reviewed: I have personally reviewed following labs and imaging studies  CBC: Recent Labs  Lab 03/05/22 0353  WBC 8.2  HGB 11.4*  HCT 34.3*  MCV 80.9  PLT 123456   Basic Metabolic Panel: Recent Labs  Lab 03/05/22 0353  NA 143  K 4.1  CL 110  CO2 27  GLUCOSE 135*  BUN 18  CREATININE 0.74  CALCIUM 9.8   GFR: Estimated Creatinine Clearance: 86.2 mL/min (by C-G formula based on SCr of 0.74 mg/dL). Liver Function Tests: No results for input(s): AST, ALT, ALKPHOS, BILITOT, PROT, ALBUMIN in the last 168 hours. No results for input(s): LIPASE, AMYLASE in the last 168 hours. No results for input(s): AMMONIA in the last 168 hours. Coagulation Profile: No results for input(s): INR, PROTIME in the last 168 hours. Cardiac Enzymes: No results for input(s): CKTOTAL, CKMB, CKMBINDEX, TROPONINI in the last 168 hours. BNP (last 3  results) No results for input(s): PROBNP in the last 8760 hours. HbA1C: No results for input(s): HGBA1C in the last 72 hours. CBG: Recent Labs  Lab 03/07/22 1934 03/07/22 2316 03/08/22 0314 03/08/22 0819 03/08/22 1116  GLUCAP 164* 165* 134* 85 105*   Lipid Profile: No results for input(s): CHOL, HDL, LDLCALC, TRIG, CHOLHDL, LDLDIRECT in the last 72 hours. Thyroid Function Tests: No results for input(s): TSH, T4TOTAL, FREET4, T3FREE, THYROIDAB in the last 72 hours. Anemia Panel: No results for input(s): VITAMINB12, FOLATE, FERRITIN, TIBC, IRON, RETICCTPCT in the last 72 hours. Sepsis Labs: No results for input(s): PROCALCITON, LATICACIDVEN in the last 168 hours.  No results found for this or any previous visit (from the past 240 hour(s)).        Radiology Studies: No results found.      Scheduled Meds:  amantadine  100 mg Per Tube BID   amLODipine  10 mg Per Tube Daily   Chlorhexidine Gluconate Cloth  6 each Topical Daily   clopidogrel  75 mg Per Tube Daily   docusate  100 mg Per Tube BID   feeding supplement  237 mL Oral BID WC   feeding supplement (JEVITY 1.5 CAL/FIBER)  1,000 mL Per Tube [id number]   folic acid  1 mg Per Tube Daily   free water  250 mL Per Tube Q6H   gabapentin  100 mg Per Tube Q12H   glycopyrrolate  1 mg Per Tube BID   guaiFENesin  10 mL Per Tube Q6H   hydrALAZINE  50 mg Per Tube Q8H   insulin aspart  0-15 Units Subcutaneous Q4H   multivitamin with minerals  1 tablet Per Tube Daily   propranolol  40 mg Per Tube TID   rosuvastatin  40 mg Per Tube Daily   senna  1 tablet Per Tube Daily   thiamine  100 mg Per Tube Daily   Continuous Infusions:  sodium chloride Stopped (02/11/22 0928)     LOS: 47 days    Time spent: 35 minutes    Adger Cantera Darleen Crocker, DO [facility]  If 7PM-7AM, please contact night-coverage [email or web address] 03/08/2022, 3:06 PM

## 2022-03-09 LAB — GLUCOSE, CAPILLARY
Glucose-Capillary: 102 mg/dL — ABNORMAL HIGH (ref 70–99)
Glucose-Capillary: 123 mg/dL — ABNORMAL HIGH (ref 70–99)
Glucose-Capillary: 143 mg/dL — ABNORMAL HIGH (ref 70–99)
Glucose-Capillary: 153 mg/dL — ABNORMAL HIGH (ref 70–99)
Glucose-Capillary: 160 mg/dL — ABNORMAL HIGH (ref 70–99)
Glucose-Capillary: 161 mg/dL — ABNORMAL HIGH (ref 70–99)
Glucose-Capillary: 95 mg/dL (ref 70–99)

## 2022-03-09 NOTE — Progress Notes (Signed)
PROGRESS NOTE    Cindy Brooks  [id number] DOB: 04-Mar-1971 DOA: 01/20/2022 PCP: Pcp, No   Brief Narrative:  Patient is a 51 year old female with history of uncontrolled hypertension, history of TIA who [facility] department on 4/9 after being found unresponsive by his family.  Patient was found to be obtunded with copious secretions, subsequently intubated on 4/9 for airway protection.  Hospital course remarkable for uncontrolled hypertension, work-up also showed thalamic stroke.  Wernicke's encephalopathy also suspected.  [facility]    PCCM was following ,she has been de-cannulated on 5/19. Awaiting further evaluation from [facility].  Important events:   4/9 Admit  4/12 ongoing high dose thiamine for possible Wernike's encephalopathy  4/13 IV sedation stopped, phenobarbital wean 4/17 still not waking up. Trach placed.  4/18 PEG placed. Spiking fever. Lactate spiked. Got fluid. Cleared.  4/22 Tolerated 6hrs of ATC day prior. No major events overnight, appears that stroke had [place] meeting with family 4/21 with no decision made 4/23 No issues overnight, on vent this am, remains unresponsive  4/27 changed to propranolol from metoprolol for neuro-storming. Started on vanc and cefepime for possible HCAP  4/28 trial on open ended trach collar 5/2 patient transferred to Hospitalist Service.  5/14 waking up and following simple commands. 5/15 downsized tracheostomy to #4 5/16 tracheostomy capping trials. 5/19 Decannulation 5/25 Foley placement for urinary retention  Assessment & Plan:   Acute metabolic encephalopathy:  - Multifactorial: Thalamic stroke, Warnicke's encephalopathy.  -MRI brain on 4/11 showed patchy restricted diffusion on both thalami and midbrain. 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 Warnicke's encephalopathy was suspected and she was given high-dose thiamine.   -Continue oral thiamine.  Patient was also on phenobarbital which was weaned off. -Status post trach, PEG placement.   -Remains on amantadine, gabapentin.   Acute hypoxic respiratory failure:  -Intubated on presentation for airway protection.  Prolonged ventilator use/tracheostomy.  Hospital course also remarkable for healthcare associated pneumonia, treated with antibiotics, completed the course.   -Repeat chest x-ray showed chronic atelectasis.  No evidence of new findings. -decannulated on 5/19, on RA, PCCM signed off   Thalamic stroke: MRI as above.  Positive anticardiolipin IgM, lupus anticoagulant: -Neurology- recommended Plavix.   Dysphagia: Status post PEG on 4/15. -Speech therapy closely following, recommending dysphagia 3 diet   Hypertension: Currently blood pressure stable.   -On amlodipine, propanolol, hydralazine   History of hyperlipidemia: On Crestor   Obesity: BMI of 30.3   Acute urinary retention: Foley placed 5/25.   Debility/Disposition: Patient seen by PT and OT and recommended SNF (since patient's insurance is not in network for CIR). Waiting for insurance auth.  Already prolonged hospitalization.  Hard to place.  TOC following  DVT prophylaxis: SCD Code Status: Full code Family Communication:  None present at bedside.  Plan of care discussed with patient in length and she verbalized understanding and agreed with it. Family member sleeping in the room Disposition Plan: SNF-TOC following  Consultants:  PCCM Neurology  Procedures:  Intubation/extubation/trach/PEG tube placement  Antimicrobials:  Cefepime Vancomycin  Status is: Inpatient     Subjective: Patient seen and examined.  She is lying comfortably on the bed.  She is alert and denies any complaints.  No fever overnight.  No acute events  overnight.  Objective: Vitals:  03/08/22 2301 03/09/22 0259 03/09/22 0500 03/09/22 0804  BP: 117/72 127/85  (!) 156/97  Pulse:    91  Resp: 20 20  20   Temp: 98 F (36.7 C)   98.5 F (36.9 C)  TempSrc: Oral   Oral  SpO2:    95%  Weight:   79.9 kg   Height:        Intake/Output Summary (Last 24 hours) at 03/09/2022 1028 Last data filed at 03/09/2022 0027 Gross per 24 hour  Intake 360 ml  Output 850 ml  Net -490 ml   Filed Weights   03/07/22 0441 03/08/22 0500 03/09/22 0500  Weight: 80.8 kg 80.3 kg 79.9 kg    Examination:  General exam: Appears calm and comfortable, on room air Respiratory system: Clear to auscultation. Respiratory effort normal. Cardiovascular system: S1 & S2 heard, RRR. No JVD, murmurs, rubs, gallops or clicks.  Gastrointestinal system: Abdomen is nondistended, soft and nontender.  PEG tube site: No sign of infection.   Central nervous system: Alert and awake Extremities: Able to move all extremities equally Skin: No rashes, lesions or ulcers    Data Reviewed: I have personally reviewed following labs and imaging studies  CBC: Recent Labs  Lab 03/05/22 0353  WBC 8.2  HGB 11.4*  HCT 34.3*  MCV 80.9  PLT 230   Basic Metabolic Panel: Recent Labs  Lab 03/05/22 0353  NA 143  K 4.1  CL 110  CO2 27  GLUCOSE 135*  BUN 18  CREATININE 0.74  CALCIUM 9.8   GFR: Estimated Creatinine Clearance: 86.1 mL/min (by C-G formula based on SCr of 0.74 mg/dL). Liver Function Tests: No results for input(s): AST, ALT, ALKPHOS, BILITOT, PROT, ALBUMIN in the last 168 hours. No results for input(s): LIPASE, AMYLASE in the last 168 hours. No results for input(s): AMMONIA in the last 168 hours. Coagulation Profile: No results for input(s): INR, PROTIME in the last 168 hours. Cardiac Enzymes: No results for input(s): CKTOTAL, CKMB, CKMBINDEX, TROPONINI in the last 168 hours. BNP (last 3 results) No results for input(s): PROBNP in the last 8760  hours. HbA1C: No results for input(s): HGBA1C in the last 72 hours. CBG: Recent Labs  Lab 03/08/22 1537 03/08/22 2101 03/09/22 0025 03/09/22 0336 03/09/22 0803  GLUCAP 90 74 160* 153* 95   Lipid Profile: No results for input(s): CHOL, HDL, LDLCALC, TRIG, CHOLHDL, LDLDIRECT in the last 72 hours. Thyroid Function Tests: No results for input(s): TSH, T4TOTAL, FREET4, T3FREE, THYROIDAB in the last 72 hours. Anemia Panel: No results for input(s): VITAMINB12, FOLATE, FERRITIN, TIBC, IRON, RETICCTPCT in the last 72 hours. Sepsis Labs: No results for input(s): PROCALCITON, LATICACIDVEN in the last 168 hours.  No results found for this or any previous visit (from the past 240 hour(s)).    Radiology Studies: No results found.  Scheduled Meds:  amantadine  100 mg Per Tube BID   amLODipine  10 mg Per Tube Daily   Chlorhexidine Gluconate Cloth  6 each Topical Daily   clopidogrel  75 mg Per Tube Daily   docusate  100 mg Per Tube BID   feeding supplement  237 mL Oral BID WC   feeding supplement (JEVITY 1.5 CAL/FIBER)  1,000 mL Per Tube Q24H   folic acid  1 mg Per Tube Daily   free water  250 mL Per Tube Q6H   gabapentin  100 mg Per Tube Q12H   glycopyrrolate  1 mg Per Tube BID   guaiFENesin  10 mL Per  Tube Q6H   hydrALAZINE  50 mg Per Tube Q8H   insulin aspart  0-15 Units Subcutaneous Q4H   multivitamin with minerals  1 tablet Per Tube Daily   propranolol  40 mg Per Tube TID   rosuvastatin  40 mg Per Tube Daily   senna  1 tablet Per Tube Daily   thiamine  100 mg Per Tube Daily   Continuous Infusions:  sodium chloride Stopped (02/11/22 0928)     LOS: 48 days   Time spent: 35 minutes   Curtez Brallier Estill Cotta, MD [facility]  If 7PM-7AM, please contact night-coverage [email or web address] 03/09/2022, 10:28 AM

## 2022-03-10 LAB — GLUCOSE, CAPILLARY
Glucose-Capillary: 139 mg/dL — ABNORMAL HIGH (ref 70–99)
Glucose-Capillary: 113 mg/dL — ABNORMAL HIGH (ref 70–99)
Glucose-Capillary: 170 mg/dL — ABNORMAL HIGH (ref 70–99)
Glucose-Capillary: 121 mg/dL — ABNORMAL HIGH (ref 70–99)
Glucose-Capillary: 108 mg/dL — ABNORMAL HIGH (ref 70–99)
Glucose-Capillary: 149 mg/dL — ABNORMAL HIGH (ref 70–99)

## 2022-03-10 MED ORDER — ENOXAPARIN SODIUM 40 MG/0.4ML IJ SOSY
40.0000 mg | PREFILLED_SYRINGE | INTRAMUSCULAR | Status: DC
  Administered 2022-03-10 – 2022-03-21 (×12): 40 mg via SUBCUTANEOUS
  Filled 2022-03-10 (×12): qty 0.4

## 2022-03-10 NOTE — Progress Notes (Signed)
PROGRESS NOTE    Cindy Brooks  [id number] DOB: 1971-04-19 DOA: 01/20/2022 PCP: Pcp, No   Brief Narrative:  Patient is a 51 year old female with history of uncontrolled hypertension, history of TIA who presented to the [facility] emergency department on 4/9 after being found unresponsive by his family.  Patient was found to be obtunded with copious secretions, subsequently intubated on 4/9 for airway protection.  Hospital course remarkable for uncontrolled hypertension, work-up also showed thalamic stroke.  Wernicke's encephalopathy also suspected.  Transferred to [facility] service.    PCCM was following ,she has been de-cannulated on 5/19. Awaiting further evaluation from [place].  Important events:   4/9 Admit  4/12 ongoing high dose thiamine for possible Wernike's encephalopathy  4/13 IV sedation stopped, phenobarbital wean 4/17 still not waking up. Trach placed.  4/18 PEG placed. Spiking fever. Lactate spiked. Got fluid. Cleared.  4/22 Tolerated 6hrs of ATC day prior. No major events overnight, appears that stroke had [place] meeting with family 4/21 with no decision made 4/23 No issues overnight, on vent this am, remains unresponsive  4/27 changed to propranolol from metoprolol for neuro-storming. Started on vanc and cefepime for possible HCAP  4/28 trial on open ended trach collar 5/2 patient transferred to Hospitalist Service.  5/14 waking up and following simple commands. 5/15 downsized tracheostomy to #4 5/16 tracheostomy capping trials. 5/19 Decannulation 5/25 Foley placement for urinary retention  Assessment & Plan:   Acute metabolic encephalopathy:  - Multifactorial: Thalamic stroke, Warnicke's encephalopathy.  -MRI brain on 4/11 showed patchy restricted diffusion on both thalami and midbrain. 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 Warnicke's encephalopathy was suspected and she was given high-dose thiamine.   -Continue oral thiamine.  Patient was also on phenobarbital which was weaned off. -Status post trach, PEG placement.   -Remains on amantadine, gabapentin.   Acute hypoxic respiratory failure:  -Intubated on presentation for airway protection.  Prolonged ventilator use/tracheostomy.  Hospital course also remarkable for healthcare associated pneumonia, treated with antibiotics, completed the course.   -Repeat chest x-ray showed chronic atelectasis.  No evidence of new findings. -decannulated on 5/19, PCCM signed off, now on RA   Thalamic stroke: MRI as above.   -Continue Plavix and statin   Phospholipid antibody with hypercoagulable state:  -Positive anticardiolipin IgM, lupus anticoagulant: - -Continue Plavix.  Start Lovenox for prophylaxis on 5/28.   Dysphagia: Status post PEG on 4/15. -Speech therapy closely following, recommending dysphagia 1 diet   Hypertension: Blood pressure elevated this morning. -On amlodipine, propanolol, hydralazine -On labetalol as needed   History of hyperlipidemia: On Crestor   Obesity: BMI of 30.3   Acute urinary retention: Foley placed 5/25.   Debility/Disposition: Patient seen by PT and OT and recommended SNF (since patient's insurance is not in network for CIR). Waiting for insurance auth.  Already prolonged hospitalization.  Hard to place.  TOC following  DVT prophylaxis: SCD/Lovenox Code Status: Full code Family Communication:  None present at bedside.  Plan of care discussed with patient in length and she verbalized understanding and agreed with it. Family member sleeping in the room Disposition Plan: SNF-TOC following  Consultants:  PCCM Neurology  Procedures:  Intubation/extubation/trach/PEG tube placement  Antimicrobials:  Cefepime Vancomycin  Status is: Inpatient    Subjective: Patient seen and  examined.  She is lying comfortably on  the bed.  She is alert and denies any new complaints. No acute events overnight.  Objective: Vitals:   03/09/22 1920 03/09/22 2300 03/10/22 0356 03/10/22 0726  BP: 124/83 121/75 (!) 153/95 (!) 166/109  Pulse: 85 85 87 90  Resp: 19 20  19   Temp: 97.9 F (36.6 C) 97.9 F (36.6 C) 98.3 F (36.8 C) 98.3 F (36.8 C)  TempSrc: Oral Axillary Axillary Oral  SpO2: 94% 95% 92% 95%  Weight:      Height:        Intake/Output Summary (Last 24 hours) at 03/10/2022 0941 Last data filed at 03/10/2022 0900 Gross per 24 hour  Intake 840 ml  Output 1850 ml  Net -1010 ml    Filed Weights   03/07/22 0441 03/08/22 0500 03/09/22 0500  Weight: 80.8 kg 80.3 kg 79.9 kg    Examination:  General exam: Appears calm and comfortable, on room air Respiratory system: Clear to auscultation. Respiratory effort normal. Cardiovascular system: S1 & S2 heard, RRR. No JVD, murmurs, rubs, gallops or clicks.  Gastrointestinal system: Abdomen is nondistended, soft and nontender.  PEG tube site: No sign of infection.   Central nervous system: Alert and awake Extremities: Able to move all extremities equally, swollen hands (non pitting) Skin: No rashes, lesions or ulcers    Data Reviewed: I have personally reviewed following labs and imaging studies  CBC: Recent Labs  Lab 03/05/22 0353  WBC 8.2  HGB 11.4*  HCT 34.3*  MCV 80.9  PLT 123456    Basic Metabolic Panel: Recent Labs  Lab 03/05/22 0353  NA 143  K 4.1  CL 110  CO2 27  GLUCOSE 135*  BUN 18  CREATININE 0.74  CALCIUM 9.8    GFR: Estimated Creatinine Clearance: 86.1 mL/min (by C-G formula based on SCr of 0.74 mg/dL). Liver Function Tests: No results for input(s): AST, ALT, ALKPHOS, BILITOT, PROT, ALBUMIN in the last 168 hours. No results for input(s): LIPASE, AMYLASE in the last 168 hours. No results for input(s): AMMONIA in the last 168 hours. Coagulation Profile: No results for input(s): INR,  PROTIME in the last 168 hours. Cardiac Enzymes: No results for input(s): CKTOTAL, CKMB, CKMBINDEX, TROPONINI in the last 168 hours. BNP (last 3 results) No results for input(s): PROBNP in the last 8760 hours. HbA1C: No results for input(s): HGBA1C in the last 72 hours. CBG: Recent Labs  Lab 03/09/22 1543 03/09/22 1922 03/09/22 2331 03/10/22 0353 03/10/22 0712  GLUCAP 102* 143* 161* 139* 113*    Lipid Profile: No results for input(s): CHOL, HDL, LDLCALC, TRIG, CHOLHDL, LDLDIRECT in the last 72 hours. Thyroid Function Tests: No results for input(s): TSH, T4TOTAL, FREET4, T3FREE, THYROIDAB in the last 72 hours. Anemia Panel: No results for input(s): VITAMINB12, FOLATE, FERRITIN, TIBC, IRON, RETICCTPCT in the last 72 hours. Sepsis Labs: No results for input(s): PROCALCITON, LATICACIDVEN in the last 168 hours.  No results found for this or any previous visit (from the past 240 hour(s)).    Radiology Studies: No results found.  Scheduled Meds:  amantadine  100 mg Per Tube BID   amLODipine  10 mg Per Tube Daily   Chlorhexidine Gluconate Cloth  6 each Topical Daily   clopidogrel  75 mg Per Tube Daily   docusate  100 mg Per Tube BID   feeding supplement  237 mL Oral BID WC   feeding supplement (JEVITY 1.5 CAL/FIBER)  1,000 mL Per Tube [id number]   folic acid  1 mg Per Tube Daily  free water  250 mL Per Tube Q6H   gabapentin  100 mg Per Tube Q12H   glycopyrrolate  1 mg Per Tube BID   guaiFENesin  10 mL Per Tube Q6H   hydrALAZINE  50 mg Per Tube Q8H   insulin aspart  0-15 Units Subcutaneous Q4H   multivitamin with minerals  1 tablet Per Tube Daily   propranolol  40 mg Per Tube TID   rosuvastatin  40 mg Per Tube Daily   senna  1 tablet Per Tube Daily   thiamine  100 mg Per Tube Daily   Continuous Infusions:  sodium chloride Stopped (02/11/22 0928)     LOS: 49 days   Time spent: 35 minutes   Jermanie Minshall Loann Quill, MD [facility]  If 7PM-7AM, please contact  night-coverage [email or web address] 03/10/2022, 9:41 AM

## 2022-03-10 NOTE — Progress Notes (Signed)
SLP Note  Patient Details Name: Cindy Brooks MRN: [id number] DOB: 1971-08-24   Visited pt at bedside given new order for reassessment for diet upgrade. Pt was seen last week, upgraded to mechanical soft for a day, but did not accept the solids on her tray. When asked, pt stated she wanted pureed foods. Today pt is more talkative and responsive than in prior visits. She says shes eating well, is not dissatisfied with meals. Pt is agreeable to attempting upgraded textures. Will order a dys 2/thin liquid tray and f/u tomorrow to check with texture during a meal.    Dhillon [place], Katherene Ponto 03/10/2022, 3:34 PM

## 2022-03-11 LAB — GLUCOSE, CAPILLARY
Glucose-Capillary: 129 mg/dL — ABNORMAL HIGH (ref 70–99)
Glucose-Capillary: 149 mg/dL — ABNORMAL HIGH (ref 70–99)
Glucose-Capillary: 152 mg/dL — ABNORMAL HIGH (ref 70–99)
Glucose-Capillary: 159 mg/dL — ABNORMAL HIGH (ref 70–99)
Glucose-Capillary: 75 mg/dL (ref 70–99)
Glucose-Capillary: 76 mg/dL (ref 70–99)
Glucose-Capillary: 83 mg/dL (ref 70–99)

## 2022-03-11 NOTE — Progress Notes (Signed)
Physical Therapy Treatment Patient Details Name: Cindy Brooks MRN: [id number] DOB: 1971/03/08 Today's Date: 03/11/2022   History of Present Illness Pt is a 51 y.o. female with history of HTN, TIA, ETOH abuse and medication noncompliance presenting to the ED after she was found by her family to be unresponsive in her car on 4/8. 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 Intubated 4/9. Tracheostormy 4/16.  Trach capped 5/16.    PT Comments    Pt tolerates treatment well, initially lethargic but becoming awake with stimulation. Pt tolerates multiple transfers with improved standing tolerance and improved ability to follow verbal and tactile cues for posture. Pt with better ability to initiate transfers with forward lean and pushing through upper extremities. Pt remains at a high risk for falls, with knee buckling when attempting to initiate stepping. Pt will continue to benefit from aggressive mobilization in an effort to reduce falls risk and dependence in transfers.  Recommendations for follow up therapy are one component of a multi-disciplinary discharge planning process, led by the attending physician.  Recommendations may be updated based on patient status, additional functional criteria and insurance authorization.  Follow Up Recommendations  Skilled nursing-short term rehab (<3 hours/day)     Assistance Recommended at Discharge Frequent or constant Supervision/Assistance  Patient can return home with the following Two people to help with walking and/or transfers;Two people to help with bathing/dressing/bathroom   Equipment Recommendations  Hospital bed;Wheelchair (measurements PT);Wheelchair cushion (measurements PT);Other (comment)    Recommendations for Other Services       Precautions / Restrictions  Precautions Precautions: Fall Precaution Comments: PEG, stoma Restrictions Weight Bearing Restrictions: No     Mobility  Bed Mobility Overal bed mobility: Needs Assistance Bed Mobility: Supine to Sit     Supine to sit: Max assist, HOB elevated     General bed mobility comments: pt with improved initiation, able to follow cues to reach with RUE across body but poor strength to pull through bed rail    Transfers Overall transfer level: Needs assistance Equipment used: 1 person hand held assist, Rolling walker (2 wheels) Transfers: Sit to/from Stand, Bed to chair/wheelchair/BSC Sit to Stand: Mod assist, Max assist     Squat pivot transfers: Max assist     General transfer comment: pt requires maxA initially, progressing to modA with sit to stand. verbal cues for hand placement, tactile cues for trunk and hip extension. Pt continues to require maxA for pivot transfers due to LE weakness. Pt performs 5 sit to stands, one for 15 seconds of standing    Ambulation/Gait             Pre-gait activities: attempted 2 marching steps with buckling of opposite LE     Stairs             Wheelchair Mobility    Modified Rankin (Stroke Patients Only) Modified Rankin (Stroke Patients Only) Pre-Morbid Rankin Score: No symptoms Modified Rankin: Severe disability     Balance Overall balance assessment: Needs assistance Sitting-balance support: Feet supported, No upper extremity supported Sitting balance-Leahy Scale: Fair     Standing balance support: Bilateral upper extremity supported, Reliant on assistive device for balance Standing balance-Leahy Scale: Poor Standing balance comment: mod-maxA                            Cognition  Arousal/Alertness: Awake/alert Behavior During Therapy: Flat affect Overall Cognitive Status: Impaired/Different from baseline Area of Impairment: Problem solving, Orientation, Memory, Awareness, Safety/judgement                  Orientation Level: Disoriented to, Time Current Attention Level: Focused Memory: Decreased short-term memory Following Commands: Follows one step commands with increased time Safety/Judgement: Decreased awareness of safety, Decreased awareness of deficits Awareness: Emergent Problem Solving: Slow processing, Requires verbal cues          Exercises      General Comments General comments (skin integrity, edema, etc.): VSS on RA      Pertinent Vitals/Pain Pain Assessment Pain Assessment: No/denies pain    Home Living                          Prior Function            PT Goals (current goals can now be found in the care plan section) Acute Rehab PT Goals Patient Stated Goal: to walk PT Goal Formulation: With patient Time For Goal Achievement: 03/25/22 Potential to Achieve Goals: Good Progress towards PT goals: Progressing toward goals    Frequency    Min 5X/week      PT Plan Current plan remains appropriate    Co-evaluation              AM-PAC PT "6 Clicks" Mobility   Outcome Measure  Help needed turning from your back to your side while in a flat bed without using bedrails?: A Lot Help needed moving from lying on your back to sitting on the side of a flat bed without using bedrails?: A Lot Help needed moving to and from a bed to a chair (including a wheelchair)?: A Lot Help needed standing up from a chair using your arms (e.g., wheelchair or bedside chair)?: A Lot Help needed to walk in hospital room?: Total Help needed climbing 3-5 steps with a railing? : Total 6 Click Score: 10    End of Session   Activity Tolerance: Patient tolerated treatment well Patient left: in chair;with call bell/phone within reach;with chair alarm set Nurse Communication: Mobility status PT Visit Diagnosis: Other abnormalities of gait and mobility (R26.89);Other symptoms and signs involving the nervous system (R29.898)     Time: [id number] PT Time  Calculation (min) (ACUTE ONLY): 25 min  Charges:  $Therapeutic Activity: 23-37 mins                     Zenaida Niece, PT, DPT Acute Rehabilitation Pager: [phone] Office [place] Taja Pentland 03/11/2022, 11:49 AM

## 2022-03-11 NOTE — Progress Notes (Signed)
PROGRESS NOTE    Cindy Brooks  [id number] DOB: [birth year] DOA: 01/20/2022 PCP: Pcp, No   Brief Narrative:  Patient is a 51 year old female with history of uncontrolled hypertension, history of TIA who presented to the [facility] emergency department on 4/9 after being found unresponsive by his family.  Patient was found to be obtunded with copious secretions, subsequently intubated on 4/9 for airway protection.  Hospital course remarkable for uncontrolled hypertension, work-up also showed thalamic stroke.  Wernicke's encephalopathy also suspected.  Transferred to [facility] service.    PCCM was consulted,she has been de-cannulated on 5/19. Awaiting further evaluation from [place].  Important events:   4/9 Admit  4/12 ongoing high dose thiamine for possible Wernike's encephalopathy  4/13 IV sedation stopped, phenobarbital wean 4/17 still not waking up. Trach placed.  4/18 PEG placed. Spiking fever. Lactate spiked. Got fluid. Cleared.  4/22 Tolerated 6hrs of ATC day prior. No major events overnight, appears that stroke had [place] meeting with family 4/21 with no decision made 4/23 No issues overnight, on vent this am, remains unresponsive  4/27 changed to propranolol from metoprolol for neuro-storming. Started on vanc and cefepime for possible HCAP  4/28 trial on open ended trach collar 5/2 patient transferred to Hospitalist Service.  5/14 waking up and following simple commands. 5/15 downsized tracheostomy to #4 5/16 tracheostomy capping trials. 5/19 Decannulation 5/25 Foley placement for urinary retention  Assessment & Plan:   Acute metabolic encephalopathy:  - Multifactorial: Thalamic stroke, Warnicke's encephalopathy.  -MRI brain on 4/11 showed patchy restricted diffusion on both thalami and midbrain. 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  -she was given high-dose thiamine.   -Continue oral thiamine.  Patient was also on phenobarbital which was weaned off. -Status post trach, PEG placement.   -Remains on amantadine, gabapentin.   Acute hypoxic respiratory failure:  -Intubated on presentation for airway protection.  Prolonged ventilator use/tracheostomy.  Hospital course also remarkable for healthcare associated pneumonia, treated with antibiotics, completed the course.   -Repeat chest x-ray showed chronic atelectasis.  No evidence of new findings. -decannulated on 5/19, PCCM signed off, now on RA   Thalamic stroke: MRI as above.   -Continue Plavix and statin   Phospholipid antibody with hypercoagulable state:  -Positive anticardiolipin IgM, lupus anticoagulant: - -Continue Plavix.  Start Lovenox for prophylaxis on 5/28.   Dysphagia: Status post PEG on 4/15. -Speech therapy closely following, recommending dysphagia 2 diet   Hypertension: Blood pressure elevated this morning. -On amlodipine, propanolol, hydralazine -On labetalol as needed   History of hyperlipidemia: On Crestor   Obesity: BMI of 30.3   Acute urinary retention: Foley placed 5/25.   Debility/Disposition: Patient seen by PT and OT and recommended SNF (since patient's insurance is not in network for CIR). Waiting for insurance auth.  Already prolonged hospitalization.  Hard to place.  TOC following  DVT prophylaxis: SCD/Lovenox Code Status: Full code Family Communication:  None present at bedside.  Plan of care discussed with patient in length and she verbalized understanding and agreed with it. Family member sleeping in the room Disposition Plan: SNF-TOC following  Consultants:  PCCM Neurology  Procedures:  Intubation/extubation/trach/PEG tube placement  Antimicrobials:  Cefepime Vancomycin  Status is: Inpatient    Subjective: Patient seen and examined.  More alert today.  Answers all  questions appropriately.  No  fever, chills.  No acute events overnight.    Objective: Vitals:   03/11/22 0300 03/11/22 0325 03/11/22 0500 03/11/22 0753  BP: 137/89   (!) 131/91  Pulse: 75   79  Resp:  (!) 25  19  Temp:  98.4 F (36.9 C)  98.1 F (36.7 C)  TempSrc:  Oral  Oral  SpO2: 93%   98%  Weight:   80.5 kg   Height:        Intake/Output Summary (Last 24 hours) at 03/11/2022 1001 Last data filed at 03/11/2022 0559 Gross per 24 hour  Intake 360 ml  Output 1450 ml  Net -1090 ml    Filed Weights   03/08/22 0500 03/09/22 0500 03/11/22 0500  Weight: 80.3 kg 79.9 kg 80.5 kg    Examination:  General exam: Appears calm and comfortable, on room air Respiratory system: Clear to auscultation. Respiratory effort normal. Cardiovascular system: S1 & S2 heard, RRR. No JVD, murmurs, rubs, gallops or clicks.  Gastrointestinal system: Abdomen is nondistended, soft and nontender.  PEG tube site: No sign of infection.   Central nervous system: Alert and awake and answers appropriately. Extremities: Able to move all extremities equally, swollen hands (non pitting) Skin: No rashes, lesions or ulcers    Data Reviewed: I have personally reviewed following labs and imaging studies  CBC: Recent Labs  Lab 03/05/22 0353  WBC 8.2  HGB 11.4*  HCT 34.3*  MCV 80.9  PLT 123456    Basic Metabolic Panel: Recent Labs  Lab 03/05/22 0353  NA 143  K 4.1  CL 110  CO2 27  GLUCOSE 135*  BUN 18  CREATININE 0.74  CALCIUM 9.8    GFR: Estimated Creatinine Clearance: 86.3 mL/min (by C-G formula based on SCr of 0.74 mg/dL). Liver Function Tests: No results for input(s): AST, ALT, ALKPHOS, BILITOT, PROT, ALBUMIN in the last 168 hours. No results for input(s): LIPASE, AMYLASE in the last 168 hours. No results for input(s): AMMONIA in the last 168 hours. Coagulation Profile: No results for input(s): INR, PROTIME in the last 168 hours. Cardiac Enzymes: No results for input(s): CKTOTAL, CKMB,  CKMBINDEX, TROPONINI in the last 168 hours. BNP (last 3 results) No results for input(s): PROBNP in the last 8760 hours. HbA1C: No results for input(s): HGBA1C in the last 72 hours. CBG: Recent Labs  Lab 03/10/22 1937 03/10/22 2317 03/11/22 0324 03/11/22 0743 03/11/22 0752  GLUCAP 108* 149* 152* 83 76    Lipid Profile: No results for input(s): CHOL, HDL, LDLCALC, TRIG, CHOLHDL, LDLDIRECT in the last 72 hours. Thyroid Function Tests: No results for input(s): TSH, T4TOTAL, FREET4, T3FREE, THYROIDAB in the last 72 hours. Anemia Panel: No results for input(s): VITAMINB12, FOLATE, FERRITIN, TIBC, IRON, RETICCTPCT in the last 72 hours. Sepsis Labs: No results for input(s): PROCALCITON, LATICACIDVEN in the last 168 hours.  No results found for this or any previous visit (from the past 240 hour(s)).    Radiology Studies: No results found.  Scheduled Meds:  amantadine  100 mg Per Tube BID   amLODipine  10 mg Per Tube Daily   Chlorhexidine Gluconate Cloth  6 each Topical Daily   clopidogrel  75 mg Per Tube Daily   docusate  100 mg Per Tube BID   enoxaparin (LOVENOX) injection  40 mg Subcutaneous Q24H   feeding supplement  237 mL Oral BID WC   feeding supplement (JEVITY 1.5 CAL/FIBER)  1,000 mL Per Tube [id number]   folic acid  1 mg Per Tube  Daily   free water  250 mL Per Tube Q6H   gabapentin  100 mg Per Tube Q12H   glycopyrrolate  1 mg Per Tube BID   guaiFENesin  10 mL Per Tube Q6H   hydrALAZINE  50 mg Per Tube Q8H   insulin aspart  0-15 Units Subcutaneous Q4H   multivitamin with minerals  1 tablet Per Tube Daily   propranolol  40 mg Per Tube TID   rosuvastatin  40 mg Per Tube Daily   senna  1 tablet Per Tube Daily   thiamine  100 mg Per Tube Daily   Continuous Infusions:  sodium chloride Stopped (02/11/22 0928)     LOS: 50 days   Time spent: 35 minutes   Anatasia Tino Loann Quill, MD [facility]  If 7PM-7AM, please contact night-coverage [email or web address] 03/11/2022,  10:01 AM

## 2022-03-11 NOTE — Progress Notes (Signed)
SLP Cancellation Note  Patient Details Name: Cindy Brooks MRN: [id number] DOB: [birth year]   Cancelled treatment:       Reason Eval/Treat Not Completed: Other (comment) Visited multiple times around breakfast and lunch to observe pt with meal; meal never arrived despite the order being no room service needed. Discussed with RN   Jameila Keeny, Riley Nearing 03/11/2022, 1:56 PM

## 2022-03-12 ENCOUNTER — Inpatient Hospital Stay (HOSPITAL_COMMUNITY): Payer: No Typology Code available for payment source

## 2022-03-12 LAB — CBC WITH DIFFERENTIAL/PLATELET
Abs Immature Granulocytes: 0.02 10*3/uL (ref 0.00–0.07)
Basophils Absolute: 0 10*3/uL (ref 0.0–0.1)
Basophils Relative: 1 %
Eosinophils Absolute: 0.2 10*3/uL (ref 0.0–0.5)
Eosinophils Relative: 3 %
HCT: 33.6 % — ABNORMAL LOW (ref 36.0–46.0)
Hemoglobin: 11.1 g/dL — ABNORMAL LOW (ref 12.0–15.0)
Immature Granulocytes: 0 %
Lymphocytes Relative: 32 %
Lymphs Abs: 1.9 10*3/uL (ref 0.7–4.0)
MCH: 26.8 pg (ref 26.0–34.0)
MCHC: 33 g/dL (ref 30.0–36.0)
MCV: 81.2 fL (ref 80.0–100.0)
Monocytes Absolute: 0.5 10*3/uL (ref 0.1–1.0)
Monocytes Relative: 8 %
Neutro Abs: 3.3 10*3/uL (ref 1.7–7.7)
Neutrophils Relative %: 56 %
Platelets: 232 10*3/uL (ref 150–400)
RBC: 4.14 MIL/uL (ref 3.87–5.11)
RDW: 15 % (ref 11.5–15.5)
WBC: 6 10*3/uL (ref 4.0–10.5)
nRBC: 0 % (ref 0.0–0.2)

## 2022-03-12 LAB — BASIC METABOLIC PANEL
Anion gap: 10 (ref 5–15)
BUN: 9 mg/dL (ref 6–20)
CO2: 22 mmol/L (ref 22–32)
Calcium: 9.2 mg/dL (ref 8.9–10.3)
Chloride: 101 mmol/L (ref 98–111)
Creatinine, Ser: 0.74 mg/dL (ref 0.44–1.00)
GFR, Estimated: >60 mL/min (ref >60)
Glucose, Bld: 191 mg/dL — ABNORMAL HIGH (ref 70–99)
Potassium: 3.7 mmol/L (ref 3.5–5.1)
Sodium: 133 mmol/L — ABNORMAL LOW (ref 135–145)

## 2022-03-12 LAB — GLUCOSE, CAPILLARY
Glucose-Capillary: 112 mg/dL — ABNORMAL HIGH (ref 70–99)
Glucose-Capillary: 115 mg/dL — ABNORMAL HIGH (ref 70–99)
Glucose-Capillary: 134 mg/dL — ABNORMAL HIGH (ref 70–99)
Glucose-Capillary: 136 mg/dL — ABNORMAL HIGH (ref 70–99)
Glucose-Capillary: 151 mg/dL — ABNORMAL HIGH (ref 70–99)
Glucose-Capillary: 78 mg/dL (ref 70–99)

## 2022-03-12 IMAGING — DX DG KNEE 1-2V PORT*L*
1 series · 4 of 4 positions shown · non-contrast
Comparison: None Available.

CLINICAL DATA: Left knee pain.

EXAM:
PORTABLE LEFT KNEE - 1-2 VIEW

[Series 1: knee · 0.14mm/px · 4 of 4 slices shown]
[im 1/4]
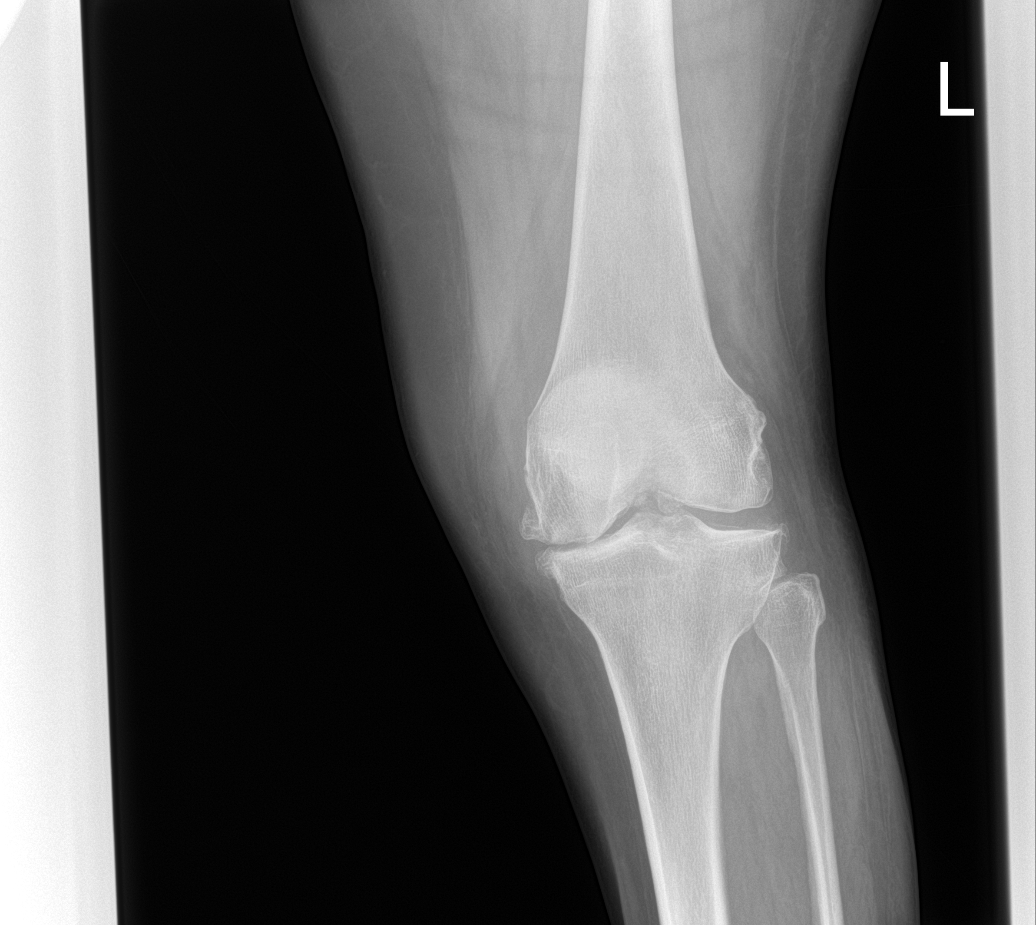
[im 2/4]
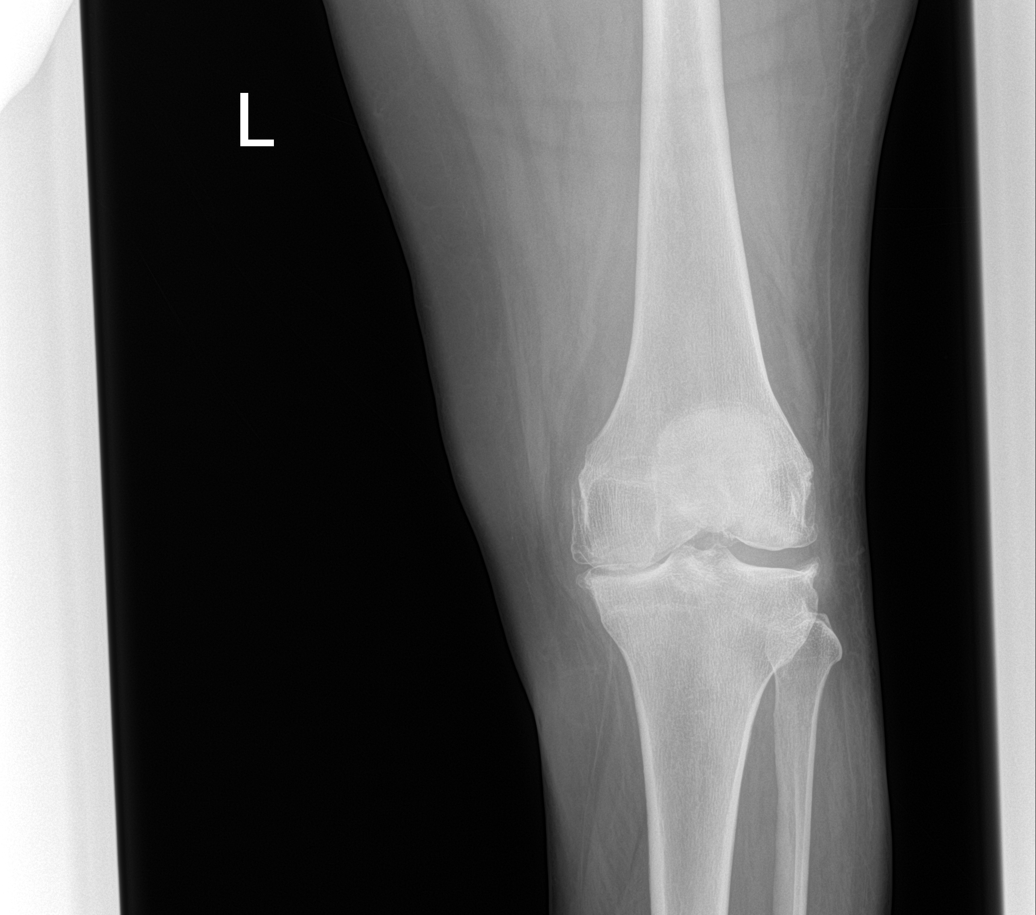
[im 3/4]
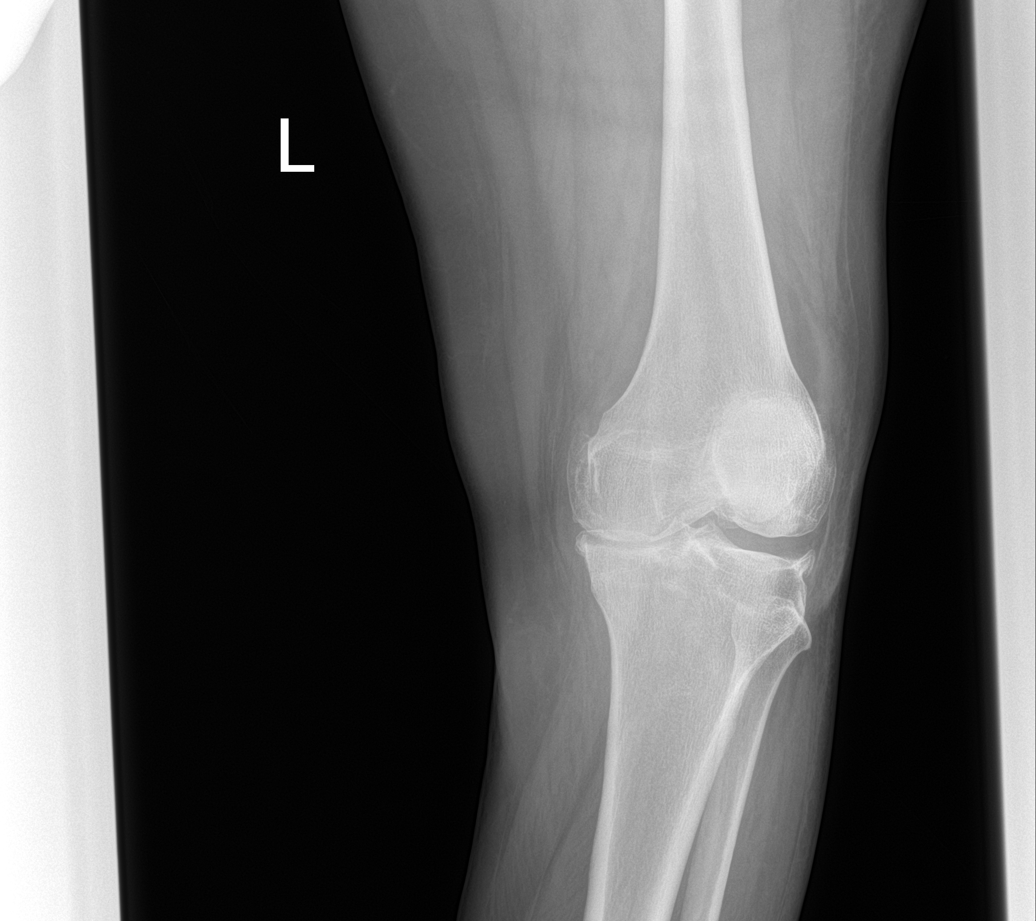
[im 4/4]
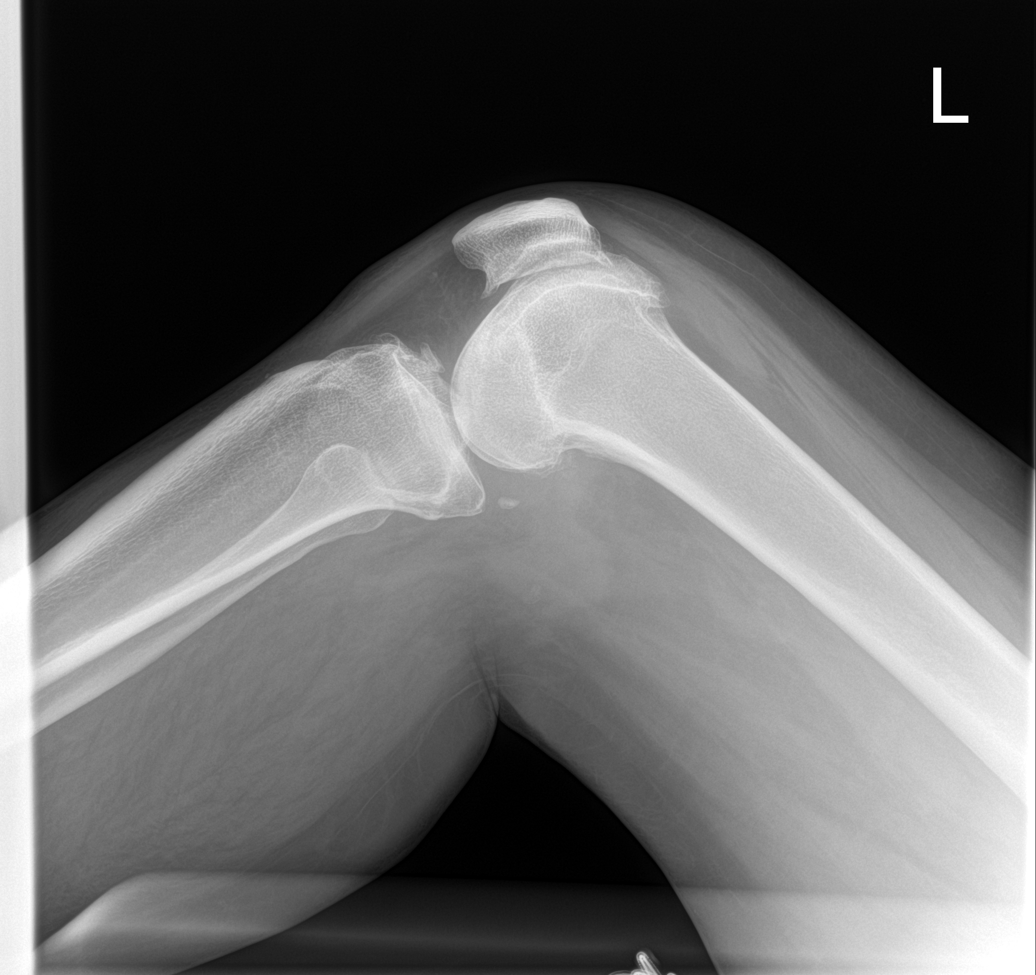

[4 of 4 positions shown; findings below may reference images not displayed]

FINDINGS: Significant age advanced tricompartmental degenerative changes most
notable involving the medial compartment. There is joint space
narrowing, osteophytic spurring and subchondral cystic change.

No acute bony findings.  Very small joint effusion.
IMPRESSION: Significant age advanced tricompartmental degenerative changes.

No acute bony findings.

## 2022-03-12 MED ORDER — FREE WATER
150.0000 mL | Freq: Four times a day (QID) | Status: DC
  Administered 2022-03-12 – 2022-03-15 (×12): 150 mL

## 2022-03-12 MED ORDER — DICLOFENAC SODIUM 1 % EX GEL
4.0000 g | Freq: Four times a day (QID) | CUTANEOUS | Status: DC
  Administered 2022-03-12 – 2022-03-20 (×35): 4 g via TOPICAL
  Filled 2022-03-12: qty 100

## 2022-03-12 MED ORDER — JEVITY 1.5 CAL/FIBER PO LIQD
1000.0000 mL | ORAL | Status: DC
  Administered 2022-03-12 – 2022-03-14 (×3): 1000 mL
  Filled 2022-03-12 (×3): qty 1000

## 2022-03-12 NOTE — Progress Notes (Signed)
Physical Therapy Treatment Patient Details Name: Cindy Brooks MRN: [id number] DOB: [birth year] Today's Date: 03/12/2022   History of Present Illness Pt is a 51 y.o. female with history of HTN, TIA, ETOH abuse and medication noncompliance presenting to the ED after she was found by her family to be unresponsive in her car on 4/8. 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 Intubated 4/9. Tracheostormy 4/16.  Trach capped 5/16.    PT Comments    STAR PT session: Pt RN reports she was just lifted back to bed after sitting up and that she may be tired limiting therapy today. Pt with good participation today, on bed pan on entry and able to turn with modA for pericare. Once clean pt able to come to EoB with min A, requiring increase support for balance once in sitting progressing from maxA to min guard for safety. Pt with increased difficult in finding her balance. Once able to balance worked on ROM and reaching outside BoS, and visual deficits. Pt beginning to fatigue but able to stand with maxAx2 x2. D/c plan continues to be appropriate, however due to insurance authorization difficulty likely pt will have to discharge home. PT will work daily with pt to progress her mobility with understanding she will likely need to be safe to discharge to home with her daughter.   Recommendations for follow up therapy are one component of a multi-disciplinary discharge planning process, led by the attending physician.  Recommendations may be updated based on patient status, additional functional criteria and insurance authorization.  Follow Up Recommendations  Skilled nursing-short term rehab (<3 hours/day)     Assistance Recommended at Discharge Frequent or constant Supervision/Assistance  Patient can return home with the following Two people to  help with walking and/or transfers;Two people to help with bathing/dressing/bathroom   Equipment Recommendations  Hospital bed;Wheelchair (measurements PT);Wheelchair cushion (measurements PT);Other (comment)    Recommendations for Other Services Rehab consult     Precautions / Restrictions Precautions Precautions: Fall Precaution Comments: PEG, stoma Restrictions Weight Bearing Restrictions: No     Mobility  Bed Mobility Overal bed mobility: Needs Assistance Bed Mobility: Supine to Sit, Rolling Rolling: Mod assist   Supine to sit: Max assist, HOB elevated     General bed mobility comments: patient initating with grabbing rail and moving legs towards EOB but required assistance to raise trunk and scoot to EOB    Transfers Overall transfer level: Needs assistance Equipment used: 2 person hand held assist Transfers: Sit to/from Stand Sit to Stand: Mod assist, Max assist, +2 physical assistance           General transfer comment: Patient performed 2 stands from EOB with mod/max assist of 2 with HHA       Modified Rankin (Stroke Patients Only) Modified Rankin (Stroke Patients Only) Pre-Morbid Rankin Score: No symptoms Modified Rankin: Severe disability     Balance Overall balance assessment: Needs assistance Sitting-balance support: Feet supported, No upper extremity supported Sitting balance-Leahy Scale: Fair Sitting balance - Comments: posterior lean, minA-minG Postural control: Posterior lean Standing balance support: Bilateral upper extremity supported Standing balance-Leahy Scale: Poor Standing balance comment: mod-maxA of 2 with HHA                            Cognition Arousal/Alertness: Awake/alert Behavior During Therapy: Flat affect Overall Cognitive Status:  Impaired/Different from baseline Area of Impairment: Problem solving, Orientation, Memory, Awareness, Safety/judgement                 Orientation Level: Disoriented to,  Time Current Attention Level: Focused Memory: Decreased short-term memory Following Commands: Follows one step commands with increased time Safety/Judgement: Decreased awareness of safety, Decreased awareness of deficits Awareness: Emergent Problem Solving: Slow processing, Requires verbal cues General Comments: was able to states she was in [place], Unable to give correct date or day of the week        Exercises General Exercises - Upper Extremity Shoulder Flexion: AAROM, Both, 5 reps Shoulder ABduction: AAROM, Both, 5 reps, Seated    General Comments General comments (skin integrity, edema, etc.): sitting balance addressed with reaching tasks and verbal cues to address posture and balance      Pertinent Vitals/Pain Pain Assessment Pain Assessment: Faces Faces Pain Scale: Hurts a little bit Pain Location: BUE shoulders with ROM Pain Descriptors / Indicators: Grimacing, Tightness Pain Intervention(s): Limited activity within patient's tolerance, Monitored during session, Repositioned     PT Goals (current goals can now be found in the care plan section) Acute Rehab PT Goals PT Goal Formulation: With patient Time For Goal Achievement: 03/25/22 Potential to Achieve Goals: Good Progress towards PT goals: Progressing toward goals    Frequency    Min 5X/week      PT Plan Current plan remains appropriate    Co-evaluation PT/OT/SLP Co-Evaluation/Treatment: Yes Reason for Co-Treatment: For patient/therapist safety PT goals addressed during session: Mobility/safety with mobility OT goals addressed during session: Strengthening/ROM      AM-PAC PT "6 Clicks" Mobility   Outcome Measure                   End of Session Equipment Utilized During Treatment: Gait belt Activity Tolerance: Patient tolerated treatment well Patient left: with bed alarm set;in bed Nurse Communication: Mobility status PT Visit Diagnosis: Other abnormalities of gait and mobility  (R26.89);Other symptoms and signs involving the nervous system (S96.283)     Time: 6629-4765 PT Time Calculation (min) (ACUTE ONLY): 33 min  Charges:  $Therapeutic Activity: 8-22 mins                     Jaynell Castagnola B. Beverely Risen PT, DPT Acute Rehabilitation Services Please use secure chat or  Call Office [phone]    Elon Alas [facility] 03/12/2022, 5:05 PM

## 2022-03-12 NOTE — Progress Notes (Signed)
This chaplain responded to Dr. Delene Ruffini consult for creating/updating the Pt. Advance Directive. The Pt. is awake and agrees to AD education with the chaplain. The Pt. answers the chaplain's clarifying questions with pauses before responding.   The chaplain understands the Pt. choice for HCPOA is the Pt. daughter-Keyona Kottke. The Pt. next choice for healthcare agent is Judee Clara. The chaplain understands the Pt. prefers for the chaplain to talk to [facility] before proceeding with the AD documentation. The Pt. shares [name] may visit on Thursday. Dr Laurann Montana joined the meeting with the Pt. and confirmed both daughters are on the Pt. contact list.  **[phone] This chaplain phoned the Pt. daughter-Keyona. The chaplain updated [name] on the AD education with the Pt. The chaplain understands [name] will visit the Pt. Thursday at 1pm. The chaplain will review the role of HCPOA at the bedside with the Pt. and Keyona.   This chaplain is available for F/U spiritual care as needed.  Chaplain Sallyanne Kuster [phone]

## 2022-03-12 NOTE — Progress Notes (Signed)
Pt pulled out IV.  Attempted to obtain PIV access x 2.  Order entered for IV team consult.  IV not placed due to pt not having any scheduled IV meds.

## 2022-03-12 NOTE — Progress Notes (Signed)
Speech Language Pathology Treatment: Dysphagia;Cognitive-Linquistic  Patient Details Name: Cindy Brooks MRN: [id number] DOB: [birth year] Today's Date: 03/12/2022 Time: 8101-7510 SLP Time Calculation (min) (ACUTE ONLY): 23 min  Assessment / Plan / Recommendation Clinical Impression  Cindy Brooks was sitting in her recliner, manipulating PEG tubing.  Provided redirection and covered end of tube to inhibit accessibility.  Cindy Brooks washed her face/hands with wet washcloth.  She required physical assist for hand-to-mouth feeding of pudding, but was motivated and worked hard to complete four oz container. Drank water from a straw with no concerns for aspiration. Verbal cues needed to attend to oral spillage.   Cindy Brooks answered basic biographical questions about herself and family with inconsistencies related to number of children/ages.  She produced short phrases with pauses during attempts at word retrieval but with ultimate success when topics were familiar.  Spontaneous output was limited but she responded consistently to questions and was pleasant and interactive.  Continue SLP for swallowing/communication. D/W RN.   HPI HPI: Cindy Brooks is a 51 y/o female who presented to the ED after being found unresponsive in her care. Cindy Brooks admitted 4/9 with thalamic stroke, treated for possible acute metabolic and wernicke's encphalopathy as well. She required intubation for airway protection on admission and had a tracheostomy placed on 4/16.  PEG on 4/18.Decannulated 5/19. MRI 4/19: Multiple foci of restricted diffusion in the bilateral frontal lobes and parietal lobes primarily.  Has suffered from severe encephalopathy, neurostorming. Started following commands around 5/15. PMH: HTN, TIA, ETOH abuse and medication noncompliance.      SLP Plan  Continue with current plan of care      Recommendations for follow up therapy are one component of a multi-disciplinary discharge planning process, led by the attending physician.   Recommendations may be updated based on patient status, additional functional criteria and insurance authorization.    Recommendations  Diet recommendations: Dysphagia 2 (fine chop);Thin liquid Liquids provided via: Cup;Straw Medication Administration: Crushed with puree Supervision: Full supervision/cueing for compensatory strategies Compensations: Slow rate;Small sips/bites;Minimize environmental distractions Postural Changes and/or Swallow Maneuvers: Seated upright 90 degrees                Oral Care Recommendations: Oral care BID Follow Up Recommendations: Skilled nursing-short term rehab (<3 hours/day) Assistance recommended at discharge: Frequent or constant Supervision/Assistance SLP Visit Diagnosis: Dysphagia, unspecified (R13.10) Plan: Continue with current plan of care         Sylvana Bonk L. Samson Frederic, MA CCC/SLP Acute Rehabilitation Services Office number [phone] Pager [phone]   Carolan Shiver  03/12/2022, 12:32 PM

## 2022-03-12 NOTE — Progress Notes (Addendum)
Due to limitations with the patient's insurance, her current disposition plan is to return home with her daughter after she is able to regain strength during hospitalization with therapies.   CSW spoke with MD to place consult for Spiritual Care to assist with advance directives.  CSW spoke with patient's daughter Cindy Brooks to provide her with updates.   Madilyn Fireman, MSW, LCSW Transitions of Care  Clinical Social Worker II [phone]

## 2022-03-12 NOTE — Progress Notes (Signed)
Nutrition Follow-up  DOCUMENTATION CODES:   Not applicable  INTERVENTION:   Continue nocturnal tube feeds via PEG tube: - Decrease rate of Jevity 1.5 to 55 ml/hr x 12 hours from 1800 to 0600 (total of 660 ml) - Decrease free water flushes to 150 ml q 6 hours  Nocturnal tube feeding regimen provides 990 kcal, 42 grams of protein, and 502 ml of H2O (meets 55% of minimum kcal needs and 47% of minimum protein needs).  Total free water with flushes: 1102 ml  - Continue MVI with minerals daily per tube  - Continue Ensure Enlive po BID, each supplement provides 350 kcal and 20 grams of protein  NUTRITION DIAGNOSIS:   Inadequate oral intake related to inability to eat as evidenced by NPO status.  Progressing, pt now on dysphagia 2 diet with thin liquids  GOAL:   Patient will meet greater than or equal to 90% of their needs  Progressing, being addressed via TF and oral nutrition supplements  MONITOR:   PO intake, Supplement acceptance, Diet advancement, Labs, Weight trends, TF tolerance, Skin  REASON FOR ASSESSMENT:   Ventilator, Consult Enteral/tube feeding initiation and management  ASSESSMENT:   51 year old female who presented to the ED on 4/09 with AMS. PMH of EtOH abuse, HTN, TIA, medication noncompliance. Pt admitted with acute encephalopathy.  04/09 - pt intubated for airway protection 04/17 - s/p trach 04/18 - s/p PEG 04/19 - pt with new watershed infarcts in B frontal, B parietal, and L temporal lobes 05/16 - trach capped 05/17 - SLP evaluation with recommendation for NPO until MBS is completed 05/18 - s/p MBS with recommendations for dysphagia 1 diet with thin liquids 05/19 - decannulated 05/24 - diet advanced to dysphagia 3 05/25 - diet downgraded to dysphagia 1 per pt request 05/28 - diet advanced to dysphagia 2  Spoke with pt at bedside. Pt in recliner at time of RD visit with Ensure supplement. NT in room providing nursing care. Pt reports appetite is  good and states that she did eat some breakfast. NT confirms pt consumed 90% of breakfast meal tray this morning and did very well.  RD will decrease volume of nocturnal tube feeds and volume of free water flushes given improvements in PO intake. Will continue with current oral nutrition supplements. Will monitor for ability to d/c nocturnal tube feeds.  Meal Completion: mostly 50-90%  Current TF: Jevity 1.5 @ 70 ml/hr x 12 hours, free water flushes of 250 ml q 6 hours  Admit weight: 78.7 kg Current weight: 80.6 kg  Pt with non-pitting edema to BUE and RLE.  Medications reviewed and include: colace, Ensure Enlive BID, folic acid, SSI q 4 hours, MVI with minerals daily, senna, thiamine  Vitamin/Mineral Profile: Vitamin B6: 4.6 (WNL) Vitamin B12: 930 (H) Vitamin A: 44.9 (WNL) Zinc: 69 (WNL)  CRP: 7.6 (H) Vitamin B1: 365.8 (H)  Labs reviewed: sodium 133 CBG's: 75-159 x 24 hours  Diet Order:   Diet Order             DIET DYS 2 Room service appropriate? No; Fluid consistency: Thin  Diet effective now                   EDUCATION NEEDS:   No education needs have been identified at this time  Skin:  Skin Assessment: Skin Integrity Issues: Stage II: labia  Last BM:  03/11/22 large type 6  Height:   Ht Readings from Last 1 Encounters:  01/20/22 5\' 4"  (1.626  m)    Weight:   Wt Readings from Last 1 Encounters:  03/12/22 80.6 kg    BMI:  Body mass index is 30.5 kg/m.  Estimated Nutritional Needs:   Kcal:  1800-2000  Protein:  90-110 grams  Fluid:  1.8-2.0 L    Gustavus Bryant, MS, RD, LDN Inpatient Clinical Dietitian Please see AMiON for contact information.

## 2022-03-12 NOTE — Progress Notes (Signed)
Occupational Therapy Treatment Patient Details Name: Cindy Brooks MRN: [id number] DOB: 20-Jan-1971 Today's Date: 03/12/2022   History of present illness Pt is a 51 y.o. female with history of HTN, TIA, ETOH abuse and medication noncompliance presenting to the ED after she was found by her family to be unresponsive in her car on 4/8. 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 Intubated 4/9. Tracheostormy 4/16.  Trach capped 5/16.   OT comments  [facility] OT/PT treatment:  Patient received in bed and on bed pan. Patient was max assist to roll to left and max assist to clean. Patient initiated on getting to EOB but required max assist to complete. On EOB patient required min assist to min guard assist for sitting balance. Patient performed AAROM for BUE shoulders, reaching tasks, and verbal cues to challenge balance and correct posture. Patient performed standing from EOB with +2 assist. Patient will continue to be followed with [facility] to increase mobility and transfers.    Recommendations for follow up therapy are one component of a multi-disciplinary discharge planning process, led by the attending physician.  Recommendations may be updated based on patient status, additional functional criteria and insurance authorization.    Follow Up Recommendations  Acute inpatient rehab (3hours/day)    Assistance Recommended at Discharge Frequent or constant Supervision/Assistance  Patient can return home with the following  Two people to help with walking and/or transfers;Two people to help with bathing/dressing/bathroom;Assistance with cooking/housework;Assistance with feeding;Direct supervision/assist for medications management;Direct supervision/assist for financial management;Assist for transportation;Help with stairs or ramp for  entrance   Equipment Recommendations  Other (comment) (TBD)    Recommendations for Other Services      Precautions / Restrictions Precautions Precautions: Fall Precaution Comments: PEG, stoma Restrictions Weight Bearing Restrictions: No       Mobility Bed Mobility Overal bed mobility: Needs Assistance Bed Mobility: Supine to Sit, Rolling Rolling: Mod assist   Supine to sit: Max assist, HOB elevated     General bed mobility comments: patient initating with grabbing rail and moving legs towards EOB but required assistance to raise trunk and scoot to EOB    Transfers Overall transfer level: Needs assistance Equipment used: 2 person hand held assist Transfers: Sit to/from Stand Sit to Stand: Mod assist, Max assist, +2 physical assistance           General transfer comment: Patient performed 2 stands from EOB with mod/max assist of 2 with HHA     Balance Overall balance assessment: Needs assistance Sitting-balance support: Feet supported, No upper extremity supported Sitting balance-Leahy Scale: Fair Sitting balance - Comments: posterior lean, minA-minG Postural control: Posterior lean Standing balance support: Bilateral upper extremity supported Standing balance-Leahy Scale: Poor Standing balance comment: mod-maxA of 2 with HHA                           ADL either performed or assessed with clinical judgement   ADL Overall ADL's : Needs assistance/impaired             Lower Body Bathing: Maximal assistance;+2 for physical assistance Lower Body Bathing Details (indicate cue type and reason): patient rolled to left with assistance of one and assitance of another to clean following bed pan use  General ADL Comments: Patient on bed pan upon entry and was assisted with cleaning    Extremity/Trunk Assessment Upper Extremity Assessment RUE Deficits / Details: grimacing with AAROM, some active movement noted at hand,  wrist and elbow. RUE Sensation: WNL RUE Coordination: decreased fine motor;decreased gross motor LUE Deficits / Details: pain with AAROM, active movements in hand wrist and elbow. required assist to get hand to mouth to complete oral hygiene LUE Sensation: WNL LUE Coordination: decreased gross motor;decreased fine motor            Vision       Perception     Praxis      Cognition Arousal/Alertness: Awake/alert Behavior During Therapy: Flat affect Overall Cognitive Status: Impaired/Different from baseline Area of Impairment: Problem solving, Orientation, Memory, Awareness, Safety/judgement                 Orientation Level: Disoriented to, Time Current Attention Level: Focused Memory: Decreased short-term memory Following Commands: Follows one step commands with increased time Safety/Judgement: Decreased awareness of safety, Decreased awareness of deficits Awareness: Emergent Problem Solving: Slow processing, Requires verbal cues General Comments: was able to states she was in [place], Unable to give correct date or day of the week        Exercises Exercises: General Upper Extremity General Exercises - Upper Extremity Shoulder Flexion: AAROM, Both, 5 reps Shoulder ABduction: AAROM, Both, 5 reps, Seated    Shoulder Instructions       General Comments sitting balance addressed with reaching tasks and verbal cues to address posture and balance    Pertinent Vitals/ Pain       Pain Assessment Pain Assessment: Faces Faces Pain Scale: Hurts a little bit Pain Location: BUE shoulders with ROM Pain Descriptors / Indicators: Grimacing, Tightness Pain Intervention(s): Monitored during session, Repositioned  Home Living                                          Prior Functioning/Environment              Frequency  Min 5X/week        Progress Toward Goals  OT Goals(current goals can now be found in the care plan section)  Progress  towards OT goals: Progressing toward goals  Acute Rehab OT Goals OT Goal Formulation: Patient unable to participate in goal setting Time For Goal Achievement: 03/17/22 Potential to Achieve Goals: Good ADL Goals Pt Will Perform Grooming: with mod assist;sitting Pt Will Perform Upper Body Bathing: with mod assist;sitting Pt Will Transfer to Toilet: with max assist;stand pivot transfer;bedside commode Additional ADL Goal #1: Pt will complete bed mobility with mod A as a precursor to ADLs Additional ADL Goal #2: Pt will follow follow simple one step commands 100% of the session to assist in ADLs  Plan Discharge plan remains appropriate    Co-evaluation    PT/OT/SLP Co-Evaluation/Treatment: Yes Reason for Co-Treatment: For patient/therapist safety;To address functional/ADL transfers   OT goals addressed during session: Strengthening/ROM      AM-PAC OT "6 Clicks" Daily Activity     Outcome Measure   Help from another person eating meals?: A Lot Help from another person taking care of personal grooming?: A Lot Help from another person toileting, which includes using toliet, bedpan, or urinal?: Total Help from another person bathing (including washing, rinsing, drying)?: Total Help from another person to put on and taking  off regular upper body clothing?: A Lot Help from another person to put on and taking off regular lower body clothing?: Total 6 Click Score: 9    End of Session Equipment Utilized During Treatment: Gait belt  OT Visit Diagnosis: Unsteadiness on feet (R26.81);Other abnormalities of gait and mobility (R26.89);Muscle weakness (generalized) (M62.81);Pain Pain - Right/Left: Right Pain - part of body: Shoulder   Activity Tolerance Patient tolerated treatment well   Patient Left in bed;with call bell/phone within reach;with bed alarm set   Nurse Communication Mobility status        Time: [id number] OT Time Calculation (min): 33 min  Charges: OT General  Charges $OT Visit: 1 Visit OT Treatments $Therapeutic Activity: 8-22 mins  Lodema Hong, [place]  Pager [phone] Office [place] 03/12/2022, 3:35 PM

## 2022-03-12 NOTE — Progress Notes (Signed)
PROGRESS NOTE    Cindy Brooks  AVW:[id number] DOB: [birth year] DOA: 01/20/2022 PCP: Pcp, No   Brief Narrative:  Patient is a 51 year old female with history of uncontrolled hypertension, history of TIA who presented to the [facility] emergency department on 4/9 after being found unresponsive by his family.  Patient was found to be obtunded with copious secretions, subsequently intubated on 4/9 for airway protection.  Hospital course remarkable for uncontrolled hypertension, work-up also showed thalamic stroke.  Wernicke's encephalopathy also suspected.  Transferred to [facility] service.    PCCM was consulted,she has been de-cannulated on 5/19. Awaiting further evaluation from [facility].  Important events:   4/9 Admit  4/12 ongoing high dose thiamine for possible Wernike's encephalopathy  4/13 IV sedation stopped, phenobarbital wean 4/17 still not waking up. Trach placed.  4/18 PEG placed. Spiking fever. Lactate spiked. Got fluid. Cleared.  4/22 Tolerated 6hrs of ATC day prior. No major events overnight, appears that stroke had GOC meeting with family 4/21 with no decision made 4/23 No issues overnight, on vent this am, remains unresponsive  4/27 changed to propranolol from metoprolol for neuro-storming. Started on vanc and cefepime for possible HCAP  4/28 trial on open ended trach collar 5/2 patient transferred to Hospitalist Service.  5/14 waking up and following simple commands. 5/15 downsized tracheostomy to #4 5/16 tracheostomy capping trials. 5/19 Decannulation 5/25 Foley placement for urinary retention  Assessment & Plan:   Acute metabolic encephalopathy:  - Multifactorial: Thalamic stroke, Warnicke's encephalopathy.  -MRI brain on 4/11 showed patchy restricted diffusion on both thalami and midbrain. 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  -she was given high-dose thiamine.   -Continue oral thiamine.   -Patient was also on phenobarbital which was weaned off. -Status post trach, PEG placement.   -Remains on amantadine, gabapentin.   Acute hypoxic respiratory failure:  -Intubated on presentation for airway protection.  Prolonged ventilator use/tracheostomy.  Hospital course also remarkable for healthcare associated pneumonia, treated with antibiotics, completed the course.   -Repeat chest x-ray showed chronic atelectasis.  No evidence of new findings.  -decannulated on 5/19 PCCM signed off now on RA   Thalamic stroke: MRI as above.   -Continue Plavix and statin   Phospholipid antibody with hypercoagulable state:  -Positive anticardiolipin IgM, lupus anticoagulant: - -Continue Plavix.  Start Lovenox for prophylaxis on 5/28.   Dysphagia: Status post PEG on 4/15. -Speech therapy closely following, recommending dysphagia 2 diet   Hypertension: Blood pressure elevated this morning. -On amlodipine, propanolol, hydralazine -On labetalol as needed   History of hyperlipidemia: On Crestor   Obesity: BMI of 30.3   Acute urinary retention: Foley placed 5/25.  Left Knee Pain: noted 5/30, L knee swelling and tenderness, pt c/o severe pain.  Xray pending.  Topical voltaren and ice packs PRN.  Continue PT as tolerated.   Debility/Disposition: Patient seen by PT and OT and recommended SNF (since patient's insurance is not in network for CIR). Waiting for insurance auth.  Already prolonged hospitalization.  Hard to place.  TOC following  DVT prophylaxis: SCD/Lovenox Code Status: Full code Family Communication:  None present at bedside.  Plan of care discussed with patient in length and she verbalized understanding and agreed with it. Family member sleeping in the room Disposition Plan: SNF-TOC following  Consultants:  PCCM Neurology  Procedures:  Intubation/extubation/trach/PEG tube placement  Antimicrobials:  Cefepime Vancomycin  Status is: Inpatient    Subjective: Patient seen and examined, up in recliner. She was finishing visit with chaplain, discussing surrogate decision makers.  Pt states her daughter Cindy Brooks should be primary decision maker, Cindy Brooks as secondary.  Pt reports left knee pain since yesterday, denies any injury or trauma to the area.    Objective: Vitals:   03/12/22 0500 03/12/22 0533 03/12/22 0736 03/12/22 1536  BP:  137/90 (!) 145/84 124/78  Pulse:   91 87  Resp:   18 (!) 29  Temp:   98.4 F (36.9 C) 98 F (36.7 C)  TempSrc:   Oral Oral  SpO2:   98% 97%  Weight: 80.6 kg     Height:        Intake/Output Summary (Last 24 hours) at 03/12/2022 1554 Last data filed at 03/12/2022 1536 Gross per 24 hour  Intake 700 ml  Output 2000 ml  Net -1300 ml   Filed Weights   03/09/22 0500 03/11/22 0500 03/12/22 0500  Weight: 79.9 kg 80.5 kg 80.6 kg    Examination:  General exam: sitting in recliner, awake, alert, no acute distress HEENT: occlusive dressing over trach site, left eye remains closed, moist mucus membranes, hearing grossly normal  Respiratory system: CTAB, no wheezes, rales or rhonchi, normal respiratory effort. Cardiovascular system: normal S1/S2, RRR, no pedal edema.   Gastrointestinal system: soft, NT, ND, no HSM felt, +bowel sounds. Central nervous system: A&O x self place. no gross focal neurologic deficits, normal speech but delayed responses Extremities: b/l UE edema, left knee swelling and tenderness no warmth or erythema Skin: dry, intact, normal temperature Psychiatry: normal mood, congruent affect, judgement and insight appear normal     Data Reviewed: I have personally reviewed following labs and imaging studies  CBC: Recent Labs  Lab 03/12/22 0612  WBC 6.0  NEUTROABS 3.3  HGB 11.1*  HCT 33.6*  MCV 81.2  PLT 232   Basic Metabolic Panel: Recent Labs  Lab  03/12/22 0612  NA 133*  K 3.7  CL 101  CO2 22  GLUCOSE 191*  BUN 9  CREATININE 0.74  CALCIUM 9.2   GFR: Estimated Creatinine Clearance: 86.5 mL/min (by C-G formula based on SCr of 0.74 mg/dL). Liver Function Tests: No results for input(s): AST, ALT, ALKPHOS, BILITOT, PROT, ALBUMIN in the last 168 hours. No results for input(s): LIPASE, AMYLASE in the last 168 hours. No results for input(s): AMMONIA in the last 168 hours. Coagulation Profile: No results for input(s): INR, PROTIME in the last 168 hours. Cardiac Enzymes: No results for input(s): CKTOTAL, CKMB, CKMBINDEX, TROPONINI in the last 168 hours. BNP (last 3 results) No results for input(s): PROBNP in the last 8760 hours. HbA1C: No results for input(s): HGBA1C in the last 72 hours. CBG: Recent Labs  Lab 03/11/22 2349 03/12/22 0312 03/12/22 0735 03/12/22 1117 03/12/22 1534  GLUCAP 149* 78 134* 112* 151*   Lipid Profile: No results for input(s): CHOL, HDL, LDLCALC, TRIG, CHOLHDL, LDLDIRECT in the last 72 hours. Thyroid Function Tests: No results for input(s): TSH, T4TOTAL, FREET4, T3FREE, THYROIDAB in the last 72 hours. Anemia Panel: No results for input(s): VITAMINB12, FOLATE, FERRITIN, TIBC, IRON, RETICCTPCT in the last 72 hours. Sepsis Labs: No results for input(s): PROCALCITON, LATICACIDVEN in the last 168 hours.  No results found for this or any previous visit (from the past 240 hour(s)).    Radiology Studies: DG Knee Left Port  Result Date: 03/12/2022 CLINICAL DATA:  Left knee pain. EXAM: PORTABLE LEFT KNEE - 1-2 VIEW COMPARISON:  None Available. FINDINGS: Significant age advanced tricompartmental degenerative changes most notable involving the medial compartment. There is joint space narrowing, osteophytic spurring and subchondral cystic change. No acute bony findings.  Very small joint effusion. 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 No acute bony findings.  Electronically Signed   By: Rudie Meyer M.D.   On: 03/12/2022 13:04    Scheduled Meds:  amantadine  100 mg Per Tube BID   amLODipine  10 mg Per Tube Daily   Chlorhexidine Gluconate Cloth  6 each Topical Daily   clopidogrel  75 mg Per Tube Daily   diclofenac Sodium  4 g Topical QID   docusate  100 mg Per Tube BID   enoxaparin (LOVENOX) injection  40 mg Subcutaneous Q24H   feeding supplement  237 mL Oral BID WC   feeding supplement (JEVITY 1.5 CAL/FIBER)  1,000 mL Per Tube Q24H   folic acid  1 mg Per Tube Daily   free water  150 mL Per Tube Q6H   gabapentin  100 mg Per Tube Q12H   glycopyrrolate  1 mg Per Tube BID   guaiFENesin  10 mL Per Tube Q6H   hydrALAZINE  50 mg Per Tube Q8H   insulin aspart  0-15 Units Subcutaneous Q4H   multivitamin with minerals  1 tablet Per Tube Daily   propranolol  40 mg Per Tube TID   rosuvastatin  40 mg Per Tube Daily   senna  1 tablet Per Tube Daily   thiamine  100 mg Per Tube Daily   Continuous Infusions:  sodium chloride Stopped (02/11/22 0928)     LOS: 51 days   Time spent: 35 minutes   Pennie Banter, DO [facility]  If 7PM-7AM, please contact night-coverage [email or web address] 03/12/2022, 3:54 PM

## 2022-03-13 LAB — GLUCOSE, CAPILLARY
Glucose-Capillary: 151 mg/dL — ABNORMAL HIGH (ref 70–99)
Glucose-Capillary: 95 mg/dL (ref 70–99)
Glucose-Capillary: 157 mg/dL — ABNORMAL HIGH (ref 70–99)
Glucose-Capillary: 101 mg/dL — ABNORMAL HIGH (ref 70–99)
Glucose-Capillary: 137 mg/dL — ABNORMAL HIGH (ref 70–99)
Glucose-Capillary: 107 mg/dL — ABNORMAL HIGH (ref 70–99)

## 2022-03-13 NOTE — Progress Notes (Signed)
Occupational Therapy Treatment Patient Details Name: Cindy Brooks MRN: [id number] DOB: 1971/02/12 Today's Date: 03/13/2022   History of present illness Pt is a 51 y.o. female with history of HTN, TIA, ETOH abuse and medication noncompliance presenting to the ED after she was found by her family to be unresponsive in her car on 4/8. 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 Intubated 4/9. Tracheostormy 4/16.  Trach capped 5/16.   OT comments  [facility] OT treatment: Patient seen to address transfers and standing balance to determine amount of assistance needed with transfers. Patient performed transfer to recliner with max assist +2 and stood in [place] to increase standing tolerance, address posture and balance. Patient performed self feeding with left hand holding cup of applesauce and using RUE to feed self with pillows under elbows. Face and hand hygiene with min assist and max assist to address bathing feet and donning socks. Patient appears motivated towards therapy. Acute OT to continue to follow with [facility] program.    Recommendations for follow up therapy are one component of a multi-disciplinary discharge planning process, led by the attending physician.  Recommendations may be updated based on patient status, additional functional criteria and insurance authorization.    Follow Up Recommendations  Skilled nursing-short term rehab (<3 hours/day)    Assistance Recommended at Discharge Frequent or constant Supervision/Assistance  Patient can return home with the following  Two people to help with walking and/or transfers;Two people to help with bathing/dressing/bathroom;Assistance with cooking/housework;Assistance with feeding;Direct supervision/assist for medications management;Direct supervision/assist for financial  management;Assist for transportation;Help with stairs or ramp for entrance   Equipment Recommendations  Other (comment) (TBD)    Recommendations for Other Services      Precautions / Restrictions Precautions Precautions: Fall Precaution Comments: PEG, stoma Restrictions Weight Bearing Restrictions: No       Mobility Bed Mobility                    Transfers                         Balance                                           ADL either performed or assessed with clinical judgement   ADL Overall ADL's : Needs assistance/impaired Eating/Feeding: Minimal assistance;Sitting Eating/Feeding Details (indicate cue type and reason): Positioned with pillows under elbows and patient holding cup of apple sauce patient was able to feed self with occasional min assist Grooming: Wash/dry hands;Wash/dry face;Min guard;Sitting Grooming Details (indicate cue type and reason): cueing for sequencing         Upper Body Dressing : Maximal assistance;Sitting Upper Body Dressing Details (indicate cue type and reason): bathed feet Lower Body Dressing: Maximal assistance;Sitting/lateral leans Lower Body Dressing Details (indicate cue type and reason): to donn socks               General ADL Comments: Patient attempting to reach feet but not able at this time to perform bathing feet and donning and doffing socks    Extremity/Trunk Assessment Upper Extremity Assessment RUE Deficits / Details: grimacing with AAROM, some active movement noted at hand, wrist and elbow. RUE Sensation: WNL RUE Coordination: decreased fine motor;decreased  gross motor LUE Deficits / Details: pain with AAROM, active movements in hand wrist and elbow. required assist to get hand to mouth to complete oral hygiene LUE Sensation: WNL LUE Coordination: decreased gross motor;decreased fine motor            Vision       Perception     Praxis      Cognition                                                 Exercises      Shoulder Instructions       General Comments continue to work on activities outside her BoS and command follow, pt able to perform various self care tasks and eating with min-modA, total A for reaching feet    Pertinent Vitals/ Pain          Home Living                                          Prior Functioning/Environment              Frequency  Min 5X/week        Progress Toward Goals  OT Goals(current goals can now be found in the care plan section)  Progress towards OT goals: Progressing toward goals  Acute Rehab OT Goals OT Goal Formulation: Patient unable to participate in goal setting Time For Goal Achievement: 03/17/22 Potential to Achieve Goals: Good ADL Goals Pt Will Perform Grooming: with mod assist;sitting Pt Will Perform Upper Body Bathing: with mod assist;sitting Pt Will Transfer to Toilet: with max assist;stand pivot transfer;bedside commode Additional ADL Goal #1: Pt will complete bed mobility with mod A as a precursor to ADLs Additional ADL Goal #2: Pt will follow follow simple one step commands 100% of the session to assist in ADLs  Plan Discharge plan remains appropriate    Co-evaluation    PT/OT/SLP Co-Evaluation/Treatment: Yes Reason for Co-Treatment: For patient/therapist safety PT goals addressed during session: Mobility/safety with mobility;Balance OT goals addressed during session: ADL's and self-care      AM-PAC OT "6 Clicks" Daily Activity     Outcome Measure   Help from another person eating meals?: A Little Help from another person taking care of personal grooming?: A Little Help from another person toileting, which includes using toliet, bedpan, or urinal?: Total Help from another person bathing (including washing, rinsing, drying)?: A Lot Help from another person to put on and taking off regular upper body clothing?: A Lot Help  from another person to put on and taking off regular lower body clothing?: A Lot 6 Click Score: 13    End of Session Equipment Utilized During Treatment: Gait belt;Other (comment) Antony Salmon([place])  OT Visit Diagnosis: Unsteadiness on feet (R26.81);Other abnormalities of gait and mobility (R26.89);Muscle weakness (generalized) (M62.81);Pain Pain - Right/Left: Right Pain - part of body: Shoulder   Activity Tolerance Patient tolerated treatment well   Patient Left in chair;with call bell/phone within reach;with chair alarm set   Nurse Communication Mobility status        Time: [id number] OT Time Calculation (min): 50 min  Charges: OT General Charges $OT Visit: 1 Visit OT Treatments $Self Care/Home Management : 8-22 mins  Alfonse Flavorsick Zoraya Fiorenza, OTA Acute  Rehabilitation Services  Pager [phone] Office [phone]   Dewain Penning 03/13/2022, 12:00 PM

## 2022-03-13 NOTE — Progress Notes (Signed)
Physical Therapy Treatment Patient Details Name: Cindy Brooks MRN: [id number] DOB: 06-09-1971 Today's Date: 03/13/2022   History of Present Illness Pt is a 51 y.o. female with history of HTN, TIA, ETOH abuse and medication noncompliance presenting to the ED after she was found by her family to be unresponsive in her car on 4/8. 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 Intubated 4/9. Tracheostormy 4/16.  Trach capped 5/16.    PT Comments    [facility] PT Session: Focus of session determining amount of assist requires for transfers and ADLs. Pt working very hard with therapy, and obviously fatigued at end of session. Pt is requiring maxA for bed mobility with HoB raised, maxAx2 for pivot transfers and standing. Spoke with daughter Cindy Brooks and will meet with her tomorrow to better be able to determine pt's needs in terms of equipment and physical assist.  D/c plans remain appropriate at this time, however PT/OT working towards safe discharge home with family.   Recommendations for follow up therapy are one component of a multi-disciplinary discharge planning process, led by the attending physician.  Recommendations may be updated based on patient status, additional functional criteria and insurance authorization.  Follow Up Recommendations  Skilled nursing-short term rehab (<3 hours/day)     Assistance Recommended at Discharge Frequent or constant Supervision/Assistance  Patient can return home with the following Two people to help with walking and/or transfers;Two people to help with bathing/dressing/bathroom   Equipment Recommendations  Hospital bed;Wheelchair (measurements PT);Wheelchair cushion (measurements PT);Other (comment) (ramp)    Recommendations for Other Services       Precautions / Restrictions  Precautions Precautions: Fall Precaution Comments: PEG, stoma Restrictions Weight Bearing Restrictions: No     Mobility  Bed Mobility Overal bed mobility: Needs Assistance Bed Mobility: Supine to Sit, Rolling     Supine to sit: Max assist, HOB elevated     General bed mobility comments: patient initating with grabbing rail and moving legs towards EOB but required assistance to raise trunk and scoot to EOB    Transfers Overall transfer level: Needs assistance Equipment used: 2 person hand held assist Transfers: Sit to/from Stand Sit to Stand: Mod assist, Max assist, +2 physical assistance     Squat pivot transfers: Total assist     General transfer comment: Patient performed 2 stands from EOB with mod/max assist of 2 with HHA Transfer via Lift Equipment: Stedy     Modified Rankin (Stroke Patients Only) Modified Rankin (Stroke Patients Only) Pre-Morbid Rankin Score: No symptoms Modified Rankin: Severe disability     Balance Overall balance assessment: Needs assistance Sitting-balance support: Feet supported, No upper extremity supported Sitting balance-Leahy Scale: Fair Sitting balance - Comments: posterior lean, minA-minG Postural control: Posterior lean Standing balance support: Bilateral upper extremity supported Standing balance-Leahy Scale: Poor Standing balance comment: mod-maxA of 2 with HHA                            Cognition Arousal/Alertness: Awake/alert Behavior During Therapy: Flat affect Overall Cognitive Status: Impaired/Different from baseline Area of Impairment: Problem solving, Orientation, Memory, Awareness, Safety/judgement                 Orientation Level: Disoriented to, Time Current Attention Level: Focused Memory: Decreased short-term memory Following Commands: Follows one step commands with increased time Safety/Judgement: Decreased awareness of safety, Decreased  awareness of deficits Awareness: Emergent Problem  Solving: Slow processing, Requires verbal cues General Comments: unable to remember therapist names from beginning of session to the end        Exercises General Exercises - Upper Extremity Shoulder Flexion: AAROM, Both Shoulder ABduction: AAROM, Both, Seated Elbow Flexion: AAROM, Both, 10 reps, Seated Elbow Extension: AAROM, Both, 10 reps, Seated Digit Composite Flexion: AAROM, Both, 5 reps Composite Extension: AAROM, Both, 5 reps    General Comments General comments (skin integrity, edema, etc.): continue to work on activities outside her BoS and command follow, pt able to perform various self care tasks and eating with min-modA, total A for reaching feet      Pertinent Vitals/Pain Pain Assessment Pain Assessment: Faces Faces Pain Scale: Hurts a little bit Pain Location: BUE shoulders with ROM Pain Descriptors / Indicators: Grimacing, Tightness Pain Intervention(s): Limited activity within patient's tolerance, Monitored during session, Repositioned           PT Goals (current goals can now be found in the care plan section) Acute Rehab PT Goals PT Goal Formulation: With patient Time For Goal Achievement: 03/25/22 Potential to Achieve Goals: Good Progress towards PT goals: Progressing toward goals    Frequency    Min 5X/week      PT Plan Current plan remains appropriate    Co-evaluation PT/OT/SLP Co-Evaluation/Treatment: Yes Reason for Co-Treatment: For patient/therapist safety PT goals addressed during session: Mobility/safety with mobility;Balance        AM-PAC PT "6 Clicks" Mobility   Outcome Measure  Help needed turning from your back to your side while in a flat bed without using bedrails?: A Lot Help needed moving from lying on your back to sitting on the side of a flat bed without using bedrails?: Total Help needed moving to and from a bed to a chair (including a wheelchair)?: Total Help needed standing up from a chair using your arms (e.g.,  wheelchair or bedside chair)?: Total Help needed to walk in hospital room?: Total Help needed climbing 3-5 steps with a railing? : Total 6 Click Score: 7    End of Session Equipment Utilized During Treatment: Gait belt Activity Tolerance: Patient tolerated treatment well Patient left: in chair;with call bell/phone within reach;with chair alarm set Nurse Communication: Mobility status PT Visit Diagnosis: Other abnormalities of gait and mobility (R26.89);Other symptoms and signs involving the nervous system [id number])     Time: [id number] PT Time Calculation (min) (ACUTE ONLY): 50 min  Charges:  $Therapeutic Activity: 8-22 mins                     Nikyla Navedo B. Migdalia Dk PT, DPT Acute Rehabilitation Services Please use secure chat or  Call Office [phone]    [place] 03/13/2022, 11:19 AM

## 2022-03-13 NOTE — TOC Progression Note (Addendum)
Transition of Care [facility]) - Progression Note    Patient Details  Name: Cindy Brooks MRN: [id number] Date of Birth: Sep 02, 1971  Transition of Care [facility]) CM/SW Contact  Janae Bridgeman, RN Phone Number: 03/13/2022, 9:03 AM  Clinical Narrative:    CM called and spoke with the patient's daughter on the phone to clarify discharge plans to home with the patient's daughter, once patient has received PT/OT through [facility] program to safely return home with the family.  The patient's family was unable to provide funding for admission to a skilled nursing facility and patient's insurance was not in network with [facility].  The patient's family plans to provide 24 hour support in the home through daughter and son-in-law.  The patient will be discharged home to listed address  - [address], [place], [place]  [address] at a pending later date as determined by [employer].  I spoke with Waynetta Sandy, PT and Raiford Noble, OT to discuss goals for patient going home after therapy through [facility] team.    Home Health providers:  [facility] - declined [facility] - declined - not in network with insurance [facility] - unable to offer services this week - will follow up [facility] - declined - not in network [facility] - reviewing clinicals for availability for PT/RN  I called this morning, 03/13/2022 and spoke with Morrie Sheldon, CM with [facility] and they are reviewing the patient's clinicals to determine availability of home health services.  The patient has PEG tube for nocturnal feeds at this time and is slowly progressing with her diet per Efraim Kaufmann, RN bedside nursing and nutrition note.  The patient has foley catheter present at this time due to urine retention.  DME: Will need wheelchair ramp, possible hospital bed or regular bed with side rail, hoyer lift, wheelchair and bedside commode if needed - Will follow up with PT/OT through [employer].  I spoke with the patient's daughter on the phone and discussed  having the son-in-law build a wheelchair ramp or purchase an ATV ramp from [employer] or [employer]  - depending on availability and height needed for front porch of the home.  The daughter was agreeable to having her husband provide as needed.  [facility] Team will continue to assess for equipment needs at home and will communicate with the daughter to assist with provision of equipment.  The patient's insurance provide is not in network with [employer] nor [employer] - so the daughter may need to order through [place] or community resources.  03/13/22 - 1203 - I called multiple [employer] and [employer] companies and found no available used wheelchairs at this time.  Medications -  will need to provide discharge medications through [facility] pharmacy.  Family plans to use [facility] on [address] once discharged home.  CM and MSW with DTP Team will continue to follow the patient for discharge needs to home - pending discharge date through [facility] team therapy.   Expected Discharge Plan: Home w Home Health Services Barriers to Discharge: Inadequate or no insurance  Expected Discharge Plan and Services Expected Discharge Plan: Home w Home Health Services In-house Referral: PCP / [facility] Discharge Planning Services: CM Consult, Follow-up appt scheduled, Medication Assistance Post Acute Care Choice: Home Health Living arrangements for the past 2 months: Single Family Home                           HH Arranged: PT  Date HH Agency Contacted: 03/13/22 Time HH Agency Contacted: 0901 Representative spoke with at [facility] Agency: [facility] ([facility])   Social Determinants of Health (SDOH) Interventions    Readmission Risk Interventions    03/13/2022    9:03 AM  Readmission Risk Prevention Plan  Transportation Screening Complete  PCP or Specialist Appt within 5-7 Days Complete  Home Care Screening Complete  Medication Review (RN CM) Complete

## 2022-03-13 NOTE — Progress Notes (Signed)
CSW was notified by Kandee Keen at [facility] that the agency cannot provide the patient with [facility] services.  CSW will attempt to obtain services from a different agency.  Edwin Dada, MSW, LCSW Transitions of Care  Clinical Social Worker II [phone]

## 2022-03-13 NOTE — Progress Notes (Signed)
This chaplain visited the Pt. to update the Pt. on the plans for [facility] to visit on Thursday and continue the discussion of HCPOA documentation.  The chaplain understands the Pt. does not have any questions at this time.  Chaplain Stephanie Acre [phone]

## 2022-03-13 NOTE — Progress Notes (Signed)
[facility]                                                                               Cindy Brooks, is a 51 y.o. female, DOB - 10/30/1970, [id number] Admit date - 01/20/2022    Outpatient Primary MD for the patient is Pcp, No  LOS - 52  days    Brief summary   Patient is a 51 year old female with history of uncontrolled hypertension, history of TIA who presented to the [facility] emergency department on 4/9 after being found unresponsive by his family.  Patient was found to be obtunded with copious secretions, subsequently intubated on 4/9 for airway protection.  Hospital course remarkable for uncontrolled hypertension, work-up also showed thalamic stroke.  Wernicke's encephalopathy also suspected.  Transferred to [facility] service.    PCCM was consulted,she has been de-cannulated on 5/19. Awaiting further evaluation from [facility].  4/9 Admit  4/12 ongoing high dose thiamine for possible Wernike's encephalopathy  4/13 IV sedation stopped, phenobarbital wean 4/17 still not waking up. Trach placed.  4/18 PEG placed. Spiking fever. Lactate spiked. Got fluid. Cleared.  4/22 Tolerated 6hrs of ATC day prior. No major events overnight, appears that stroke had GOC meeting with family 4/21 with no decision made 4/23 No issues overnight, on vent this am, remains unresponsive  4/27 changed to propranolol from metoprolol for neuro-storming. Started on vanc and cefepime for possible HCAP  4/28 trial on open ended trach collar 5/2 patient transferred to Hospitalist Service.  5/14 waking up and following simple commands. 5/15 downsized tracheostomy to #4 5/16 tracheostomy capping trials. 5/19 Decannulation 5/25 Foley placement for urinary retention    Assessment & Plan    Assessment and Plan:    Acute metabolic encephalopathy:  - Multifactorial: Thalamic stroke, Warnicke's encephalopathy.  -MRI brain on 4/11 showed patchy restricted diffusion on both thalami and midbrain. 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  -she was given high-dose thiamine.   -Continue oral thiamine.   -Patient was also on phenobarbital which was weaned off. -Status post trach, PEG placement.   -Remains on amantadine, gabapentin.   Acute hypoxic respiratory failure:  -Intubated on presentation for airway protection.  Prolonged ventilator use/tracheostomy.  Hospital course also remarkable for healthcare associated pneumonia, treated with antibiotics, completed the course.   -Repeat chest x-ray showed chronic atelectasis.  No evidence of new findings.  -decannulated on 5/19 PCCM signed off now on RA   Thalamic stroke: continue with plavix and statin.    Phospholipid antibody with hypercoagulable state:  -Positive anticardiolipin IgM, lupus anticoagulant: - -Continue Plavix.  Start Lovenox for prophylaxis on 5/28.   Dysphagia: Status post PEG on 4/15. -Speech therapy closely following, recommending dysphagia 2 diet   Hypertension: BP parameters are optimal.    History of hyperlipidemia: On Crestor   Obesity: BMI of 30.3 Outpatient follow up with Pcp.    Acute urinary retention: Foley placed 5/25. Will need voiding trial in a week.    Left Knee Pain: noted  5/30, L knee swelling and tenderness, pt c/o severe pain.  Xray pending.  Topical voltaren and ice packs PRN.  Continue PT as tolerated.   RN Pressure Injury Documentation:    Malnutrition Type:  Nutrition Problem: Inadequate oral intake Etiology: inability to eat   Malnutrition Characteristics:  Signs/Symptoms: NPO status   Nutrition Interventions:  Interventions: Ensure Enlive (each supplement provides 350kcal and 20 grams of protein), MVI, Tube feeding  Estimated body mass index is 30.5 kg/m as calculated from the  following:   Height as of this encounter: 5\' 4"  (1.626 m).   Weight as of this encounter: 80.6 kg.  Code Status: full code.  DVT Prophylaxis:  enoxaparin (LOVENOX) injection 40 mg Start: 03/10/22 1045 SCDs Start: 01/20/22 1012   Level of Care: Level of care: Progressive Family Communication: none at bedside.   Disposition Plan:     Remains inpatient appropriate:  awaiting SNF.   Antimicrobials:   Anti-infectives (From admission, onward)    Start     Dose/Rate Route Frequency Ordered Stop   02/08/22 1800  vancomycin (VANCOREADY) IVPB 1500 mg/300 mL  Status:  Discontinued        1,500 mg 150 mL/hr over 120 Minutes Intravenous Every 24 hours 02/07/22 1712 02/09/22 1046   02/07/22 1800  vancomycin (VANCOREADY) IVPB 1750 mg/350 mL        1,750 mg 175 mL/hr over 120 Minutes Intravenous  Once 02/07/22 1708 02/07/22 2046   02/07/22 1700  ceFEPIme (MAXIPIME) 2 g in sodium chloride 0.9 % 100 mL IVPB        2 g 200 mL/hr over 30 Minutes Intravenous Every 8 hours 02/07/22 1706 02/14/22 0856   01/20/22 2000  cefTRIAXone (ROCEPHIN) 2 g in sodium chloride 0.9 % 100 mL IVPB        2 g 200 mL/hr over 30 Minutes Intravenous Every 24 hours 01/20/22 0634 01/24/22 2022   01/20/22 0415  cefTRIAXone (ROCEPHIN) 2 g in sodium chloride 0.9 % 100 mL IVPB        2 g 200 mL/hr over 30 Minutes Intravenous  Once 01/20/22 0414 01/20/22 0537        Medications  Scheduled Meds:  amantadine  100 mg Per Tube BID   amLODipine  10 mg Per Tube Daily   Chlorhexidine Gluconate Cloth  6 each Topical Daily   clopidogrel  75 mg Per Tube Daily   diclofenac Sodium  4 g Topical QID   docusate  100 mg Per Tube BID   enoxaparin (LOVENOX) injection  40 mg Subcutaneous Q24H   feeding supplement  237 mL Oral BID WC   feeding supplement (JEVITY 1.5 CAL/FIBER)  1,000 mL Per Tube Q24H   folic acid  1 mg Per Tube Daily   free water  150 mL Per Tube Q6H   gabapentin  100 mg Per Tube Q12H   glycopyrrolate  1 mg Per  Tube BID   guaiFENesin  10 mL Per Tube Q6H   hydrALAZINE  50 mg Per Tube Q8H   insulin aspart  0-15 Units Subcutaneous Q4H   multivitamin with minerals  1 tablet Per Tube Daily   propranolol  40 mg Per Tube TID   rosuvastatin  40 mg Per Tube Daily   senna  1 tablet Per Tube Daily   thiamine  100 mg Per Tube Daily   Continuous Infusions:  sodium chloride Stopped (02/11/22 0928)   PRN Meds:.sodium chloride, acetaminophen (TYLENOL) oral liquid 160 mg/5 mL, Gerhardt's butt cream,  ipratropium-albuterol, labetalol, metoprolol tartrate, polyethylene glycol, promethazine    Subjective:   Cindy Brooks was seen and examined today. No new complaints.   Objective:   Vitals:   03/13/22 0627 03/13/22 0747 03/13/22 1140 03/13/22 1540  BP: 140/89 (!) 145/89 120/84 (!) 128/91  Pulse:  75 90 91  Resp:  19 (!) 25 19  Temp:  98.3 F (36.8 C) (!) 97.5 F (36.4 C) 98.5 F (36.9 C)  TempSrc:  Oral Oral Oral  SpO2:  96% 96% 93%  Weight:      Height:        Intake/Output Summary (Last 24 hours) at 03/13/2022 1616 Last data filed at 03/13/2022 0845 Gross per 24 hour  Intake 680 ml  Output 1650 ml  Net -970 ml   Filed Weights   03/11/22 0500 03/12/22 0500 03/13/22 0500  Weight: 80.5 kg 80.6 kg 80.6 kg     Exam General: Alert and oriented to self only NAD Cardiovascular: S1 S2 auscultated, no murmurs, RRR Respiratory: Clear to auscultation bilaterally, no wheezing, rales or rhonchi Gastrointestinal: Soft, nontender, nondistended, + bowel sounds Ext: upper extremity edema.  Neuro: able to move all extremities, alert but delayed response Skin: No rashes Psych: Normal affect and demeanor, alert and oriented  to person.    Data Reviewed:  I have personally reviewed following labs and imaging studies   CBC Lab Results  Component Value Date   WBC 6.0 03/12/2022   RBC 4.14 03/12/2022   HGB 11.1 (L) 03/12/2022   HCT 33.6 (L) 03/12/2022   MCV 81.2 03/12/2022   MCH 26.8 03/12/2022    PLT 232 03/12/2022   MCHC 33.0 03/12/2022   RDW 15.0 03/12/2022   LYMPHSABS 1.9 03/12/2022   MONOABS 0.5 03/12/2022   EOSABS 0.2 03/12/2022   BASOSABS 0.0 03/12/2022     Last metabolic panel Lab Results  Component Value Date   NA 133 (L) 03/12/2022   K 3.7 03/12/2022   CL 101 03/12/2022   CO2 22 03/12/2022   BUN 9 03/12/2022   CREATININE 0.74 03/12/2022   GLUCOSE 191 (H) 03/12/2022   GFRNONAA >60 03/12/2022   CALCIUM 9.2 03/12/2022   PHOS 4.0 02/25/2022   PROT 6.9 02/25/2022   ALBUMIN 2.7 (L) 02/25/2022   BILITOT 0.4 02/25/2022   ALKPHOS 84 02/25/2022   AST 47 (H) 02/25/2022   ALT 144 (H) 02/25/2022   ANIONGAP 10 03/12/2022    CBG (last 3)  Recent Labs    03/13/22 0748 03/13/22 1138 03/13/22 1539  GLUCAP 95 157* 101*      Coagulation Profile: No results for input(s): INR, PROTIME in the last 168 hours.   Radiology Studies: DG Knee Left Port  Result Date: 03/12/2022 CLINICAL DATA:  Left knee pain. EXAM: PORTABLE LEFT KNEE - 1-2 VIEW COMPARISON:  None Available. FINDINGS: Significant age advanced tricompartmental degenerative changes most notable involving the medial compartment. There is joint space narrowing, osteophytic spurring and subchondral cystic change. No acute bony findings.  Very small joint effusion. 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 No acute bony findings. Electronically Signed   By: Rudie Meyer M.D.   On: 03/12/2022 13:04       Kathlen Mody M.D. [facility] 03/13/2022, 4:16 PM  Available via Epic secure chat 7am-7pm After 7 pm, please refer to night coverage provider listed on amion.

## 2022-03-14 LAB — GLUCOSE, CAPILLARY
Glucose-Capillary: 138 mg/dL — ABNORMAL HIGH (ref 70–99)
Glucose-Capillary: 102 mg/dL — ABNORMAL HIGH (ref 70–99)
Glucose-Capillary: 171 mg/dL — ABNORMAL HIGH (ref 70–99)
Glucose-Capillary: 112 mg/dL — ABNORMAL HIGH (ref 70–99)
Glucose-Capillary: 157 mg/dL — ABNORMAL HIGH (ref 70–99)
Glucose-Capillary: 139 mg/dL — ABNORMAL HIGH (ref 70–99)

## 2022-03-14 MED ORDER — TAMSULOSIN HCL 0.4 MG PO CAPS
0.4000 mg | ORAL_CAPSULE | Freq: Every day | ORAL | Status: DC
  Administered 2022-03-14 – 2022-03-20 (×7): 0.4 mg via ORAL
  Filled 2022-03-14 (×6): qty 1

## 2022-03-14 NOTE — Progress Notes (Signed)
Physical Therapy Treatment Patient Details Name: Cindy Brooks MRN: [id number] DOB: [birth year] Today's Date: 03/14/2022   History of Present Illness Pt is a 51 y.o. female with history of HTN, TIA, ETOH abuse and medication noncompliance presenting to the ED after she was found by her family to be unresponsive in her car on 4/8. 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 Intubated 4/9. Tracheostormy 4/16.  Trach capped 5/16.    PT Comments    [facility] PT Session: Focus of session determining level of care needed for discharge and caregiver training. Pt continues to progress her strength and coordination resulting in better mobility and transfers. Pt performed bed mobility with modAx2 and 3x pivot transfers chair to bed, bed to [facility] and BSC to chair with modAx2 - maxAx2 as pt fatigued. Pt family to order wheelchair, BSC, bed rails and ramp. Pending progress discharge goal next Thursday. Goals reviewed and updated.     Recommendations for follow up therapy are one component of a multi-disciplinary discharge planning process, led by the attending physician.  Recommendations may be updated based on patient status, additional functional criteria and insurance authorization.  Follow Up Recommendations  Skilled nursing-short term rehab (<3 hours/day)     Assistance Recommended at Discharge Frequent or constant Supervision/Assistance  Patient can return home with the following Two people to help with walking and/or transfers;Two people to help with bathing/dressing/bathroom   Equipment Recommendations  Hospital bed;Wheelchair (measurements PT);Wheelchair cushion (measurements PT);Other (comment)    Recommendations for Other Services       Precautions / Restrictions Precautions Precautions: Fall Precaution Comments: PEG,  stoma Restrictions Weight Bearing Restrictions: No     Mobility  Bed Mobility Overal bed mobility: Needs Assistance Bed Mobility: Supine to Sit, Sit to Supine     Supine to sit: Mod assist Sit to supine: Mod assist   General bed mobility comments: caregiver training with daughter and son in law with patient requiring assistance with trunk and LE with less assistance with LEs    Transfers Overall transfer level: Needs assistance Equipment used: 2 person hand held assist Transfers: Sit to/from Stand Sit to Stand: Mod assist, Max assist, +2 physical assistance   Step pivot transfers: Mod assist, Max assist, +2 physical assistance       General transfer comment: transfer performed from recliner to EOB, EOB to [facility], and BSC to recliner with family present for caregiver education.       Modified Rankin (Stroke Patients Only) Modified Rankin (Stroke Patients Only) Pre-Morbid Rankin Score: No symptoms Modified Rankin: Severe disability     Balance Overall balance assessment: Needs assistance Sitting-balance support: Feet supported, No upper extremity supported Sitting balance-Leahy Scale: Fair Sitting balance - Comments: posterior lean, minA-minG Postural control: Posterior lean Standing balance support: Bilateral upper extremity supported Standing balance-Leahy Scale: Poor Standing balance comment: mod-maxA of 2 with HHA                            Cognition Arousal/Alertness: Awake/alert Behavior During Therapy: Flat affect Overall Cognitive Status: Impaired/Different from baseline Area of Impairment: Problem solving, Orientation, Memory, Awareness, Safety/judgement                 Orientation Level: Disoriented to, Time Current Attention Level: Focused Memory: Decreased short-term memory Following Commands: Follows one step commands with increased time Safety/Judgement: Decreased  awareness of safety, Decreased awareness of deficits Awareness:  Emergent Problem Solving: Slow processing, Requires verbal cues General Comments: improvement with following commands, continues to require cues for safety        Exercises General Exercises - Upper Extremity Shoulder Flexion: AAROM, Both Shoulder ABduction: AAROM, Both, Seated    General Comments  Family present for caregiver training. VSS on RA request pt taken off tele given stable vitals to decreased pt distraction.       Pertinent Vitals/Pain Pain Assessment Pain Assessment: Faces Faces Pain Scale: Hurts a little bit Pain Location: BUE shoulders with ROM Pain Descriptors / Indicators: Tightness Pain Intervention(s): Monitored during session     PT Goals (current goals can now be found in the care plan section) Acute Rehab PT Goals PT Goal Formulation: With patient Time For Goal Achievement: 03/25/22 Potential to Achieve Goals: Good Progress towards PT goals: Progressing toward goals    Frequency    Min 5X/week      PT Plan Current plan remains appropriate    Co-evaluation PT/OT/SLP Co-Evaluation/Treatment: Yes Reason for Co-Treatment: For patient/therapist safety PT goals addressed during session: Mobility/safety with mobility OT goals addressed during session: ADL's and self-care      AM-PAC PT "6 Clicks" Mobility   Outcome Measure  Help needed turning from your back to your side while in a flat bed without using bedrails?: A Lot Help needed moving from lying on your back to sitting on the side of a flat bed without using bedrails?: A Lot Help needed moving to and from a bed to a chair (including a wheelchair)?: Total Help needed standing up from a chair using your arms (e.g., wheelchair or bedside chair)?: Total Help needed to walk in hospital room?: Total Help needed climbing 3-5 steps with a railing? : Total 6 Click Score: 8    End of Session Equipment Utilized During Treatment: Gait belt Activity Tolerance: Patient tolerated treatment  well Patient left: in chair;with call bell/phone within reach;with chair alarm set Nurse Communication: Mobility status PT Visit Diagnosis: Other abnormalities of gait and mobility (R26.89);Other symptoms and signs involving the nervous system (V91.660)     Time: 6004-5997 PT Time Calculation (min) (ACUTE ONLY): 34 min  Charges:  $Therapeutic Activity: 8-22 mins                     Seydina Holliman B. Beverely Risen PT, DPT Acute Rehabilitation Services Please use secure chat or  Call Office [phone]    Elon Alas [facility] 03/14/2022, 4:03 PM

## 2022-03-14 NOTE — TOC Progression Note (Addendum)
Transition of Care [facility]) - Progression Note    Patient Details  Name: Cindy Brooks MRN: [id number] Date of Birth: 1971/03/15  Transition of Care [facility]) CM/SW [place], RN Phone Number: 03/14/2022, 9:21 AM  Clinical Narrative:    Cm spoke with Caryl Pina, CM with [facility] and they are accepting the patient for home health services once discharged from the hospital.  I spoke with the patient's daughter, [name] this morning and she is planning on ordering the patient's needed Medical dme through [place] with her flex spending card.  The patient's other daughter will be purchasing the patient's wheelchair privately as well.   CM will meet with the patient's family and [facility] Team today at 12 noon to discuss patient's care and mobility needs for home with [place], PT and [place], [place].  03/14/2022 1221 - I spoke with Dr. Karleen Hampshire, attending and she plans to have urinary voiding trial before the patient is discharged home at a later date and follow up with urology office.  Also, the patient is currently on nocturnal feeds - dietary is following for hopeful discontinuation before the patient goes home as well.  I will speak with the patient's daughter to determine if the patient has an available glucometer at the home - considering the patient has blood sugar monitoring and treatment in the hospital.  03/14/2022 1306 - CM met with the patient and daughter, [name] in the room to discuss discharge plans to home next Thursday, 6/8.  The daughter is going to purchase a side rail for the bed, wheelchair and bedside commode for home.  The daughter states the patient was not using insulin nor glucometer prior to admission and will need teaching prior to discharge.  I notified the attending physician and bedside nursing that diabetes consult will be needed for teaching for both.  The patient's daughter states that the family is building a ramp at the home this week and she is aware that patient is  discharging next week.  The patient will be able to discharge by private vehicle.  The patient's PEG will be clamped but present when she is discharged home.    Chaplain present at bedside to complete Healthcare POA.  CM and MSW with DTP Team will continue to follow the patient for [facility] needs for likely discharge to home at a later date.   Expected Discharge Plan: [place] Barriers to Discharge: Inadequate or no insurance  Expected Discharge Plan and Services Expected Discharge Plan: [place] In-house Referral: PCP / [facility] Discharge Planning Services: CM Consult, Follow-up appt scheduled, Medication Assistance Post Acute Care Choice: [place] arrangements for the past 2 months: Single Family Home                           HH Arranged: PT   Date HH Agency Contacted: 03/13/22 Time HH Agency Contacted: 0901 Representative spoke with at [place]: [place]   Social Determinants of Health (SDOH) Interventions    Readmission Risk Interventions    03/13/2022    9:03 AM  Readmission Risk Prevention Plan  Transportation Screening Complete  PCP or Specialist Appt within 5-7 Days Complete  Home Care Screening Complete  Medication Review (RN CM) Complete

## 2022-03-14 NOTE — Progress Notes (Signed)
[facility]                                                                               Cindy Brooks, is a 51 y.o. female, DOB - 04/02/1971, NOM:[id number] Admit date - 01/20/2022    Outpatient Primary MD for the patient is Pcp, No  LOS - 53  days    Brief summary   Patient is a 51 year old female with history of uncontrolled hypertension, history of TIA who presented to the [facility] emergency department on 4/9 after being found unresponsive by his family.  Patient was found to be obtunded with copious secretions, subsequently intubated on 4/9 for airway protection.  Hospital course remarkable for uncontrolled hypertension, work-up also showed thalamic stroke.  Wernicke's encephalopathy also suspected.  Transferred to [facility] service.    PCCM was consulted,she has been de-cannulated on 5/19. Awaiting further evaluation from [facility].  4/9 Admit  4/12 ongoing high dose thiamine for possible Wernike's encephalopathy  4/13 IV sedation stopped, phenobarbital wean 4/17 still not waking up. Trach placed.  4/18 PEG placed. Spiking fever. Lactate spiked. Got fluid. Cleared.  4/22 Tolerated 6hrs of ATC day prior. No major events overnight, appears that stroke had GOC meeting with family 4/21 with no decision made 4/23 No issues overnight, on vent this am, remains unresponsive  4/27 changed to propranolol from metoprolol for neuro-storming. Started on vanc and cefepime for possible HCAP  4/28 trial on open ended trach collar 5/2 patient transferred to Hospitalist Service.  5/14 waking up and following simple commands. 5/15 downsized tracheostomy to #4 5/16 tracheostomy capping trials. 5/19 Decannulation 5/25 Foley placement for urinary retention Plan for voiding trial in am.    Assessment & Plan    Assessment and Plan:    Acute metabolic encephalopathy:  - Multifactorial: Thalamic stroke, Warnicke's encephalopathy.  -MRI brain on 4/11 showed patchy restricted diffusion on  both thalami and midbrain. 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  S/p high dose thiamine IV, followed by oral thiamine.  -Patient was also on phenobarbital which was weaned off. -Status post trach, PEG placement.   -Remains on amantadine, gabapentin.   Acute hypoxic respiratory failure:  -Intubated on presentation for airway protection.  Prolonged ventilator use/tracheostomy.  Hospital course also remarkable for healthcare associated pneumonia, treated with antibiotics, completed the course.   -Repeat chest x-ray showed chronic atelectasis.  No evidence of new findings.  -decannulated on 5/19 PCCM signed off now on RA   Thalamic stroke: continue with plavix and statin.    Phospholipid antibody with hypercoagulable state:  -Positive anticardiolipin IgM, lupus anticoagulant: - -Continue Plavix.  Start Lovenox for prophylaxis on 5/28.   Dysphagia: Status post PEG on 4/15. -Speech therapy closely following, recommending dysphagia 2 diet She is also getting nocturnal feeds, plan to discontinue the nocturnal feeds prior to discharge.    Hypertension: BP parameters are well controlled.    History of hyperlipidemia: On Crestor   Obesity: BMI of 30.3 Outpatient follow up with Pcp.  Acute urinary retention: Foley placed 5/25.  Started her on flomax and plan for voiding trial in a day or two.    Left Knee Pain: noted 5/30, L knee swelling and tenderness, pt c/o severe pain.  Xray pending.  Topical voltaren and ice packs PRN.  Continue PT as tolerated.   RN Pressure Injury Documentation:    Malnutrition Type:  Nutrition Problem: Inadequate oral intake Etiology: inability to eat   Malnutrition Characteristics:  Signs/Symptoms: NPO status   Nutrition Interventions:  Interventions:  Ensure Enlive (each supplement provides 350kcal and 20 grams of protein), MVI, Tube feeding  Estimated body mass index is 30.27 kg/m as calculated from the following:   Height as of this encounter: 5\' 4"  (1.626 m).   Weight as of this encounter: 80 kg.  Code Status: full code.  DVT Prophylaxis:  enoxaparin (LOVENOX) injection 40 mg Start: 03/10/22 1045 SCDs Start: 01/20/22 1012   Level of Care: Level of care: med surg.  Family Communication: none at bedside.   Disposition Plan:     Remains inpatient appropriate:  awaiting SNF.   Antimicrobials:   Anti-infectives (From admission, onward)    Start     Dose/Rate Route Frequency Ordered Stop   02/08/22 1800  vancomycin (VANCOREADY) IVPB 1500 mg/300 mL  Status:  Discontinued        1,500 mg 150 mL/hr over 120 Minutes Intravenous Every 24 hours 02/07/22 1712 02/09/22 1046   02/07/22 1800  vancomycin (VANCOREADY) IVPB 1750 mg/350 mL        1,750 mg 175 mL/hr over 120 Minutes Intravenous  Once 02/07/22 1708 02/07/22 2046   02/07/22 1700  ceFEPIme (MAXIPIME) 2 g in sodium chloride 0.9 % 100 mL IVPB        2 g 200 mL/hr over 30 Minutes Intravenous Every 8 hours 02/07/22 1706 02/14/22 0856   01/20/22 2000  cefTRIAXone (ROCEPHIN) 2 g in sodium chloride 0.9 % 100 mL IVPB        2 g 200 mL/hr over 30 Minutes Intravenous Every 24 hours 01/20/22 0634 01/24/22 2022   01/20/22 0415  cefTRIAXone (ROCEPHIN) 2 g in sodium chloride 0.9 % 100 mL IVPB        2 g 200 mL/hr over 30 Minutes Intravenous  Once 01/20/22 0414 01/20/22 0537        Medications  Scheduled Meds:  amantadine  100 mg Per Tube BID   amLODipine  10 mg Per Tube Daily   Chlorhexidine Gluconate Cloth  6 each Topical Daily   clopidogrel  75 mg Per Tube Daily   diclofenac Sodium  4 g Topical QID   docusate  100 mg Per Tube BID   enoxaparin (LOVENOX) injection  40 mg Subcutaneous Q24H   feeding supplement  237 mL Oral BID WC   feeding supplement (JEVITY 1.5 CAL/FIBER)   1,000 mL Per Tube Q24H   folic acid  1 mg Per Tube Daily   free water  150 mL Per Tube Q6H   gabapentin  100 mg Per Tube Q12H   glycopyrrolate  1 mg Per Tube BID   guaiFENesin  10 mL Per Tube Q6H   hydrALAZINE  50 mg Per Tube Q8H   insulin aspart  0-15 Units Subcutaneous Q4H   multivitamin with minerals  1 tablet Per Tube Daily   propranolol  40 mg Per Tube TID   rosuvastatin  40 mg Per Tube Daily   senna  1 tablet Per Tube Daily   tamsulosin  0.4 mg Oral QPC supper   thiamine  100 mg Per Tube Daily   Continuous Infusions:  sodium chloride Stopped (02/11/22 0928)   PRN Meds:.sodium chloride, acetaminophen (TYLENOL) oral liquid 160 mg/5 mL, Gerhardt's butt cream, ipratropium-albuterol, labetalol, metoprolol tartrate, polyethylene glycol, promethazine    Subjective:   Cindy Brooks was seen and examined today. Sitting in the chair and eating.   Objective:   Vitals:   03/14/22 0305 03/14/22 0500 03/14/22 0804 03/14/22 1120  BP: 139/86  (!) 150/93 117/86  Pulse: 62  90 85  Resp: 20  19 19   Temp: 98.4 F (36.9 C)  98.4 F (36.9 C) 98.1 F (36.7 C)  TempSrc: Axillary  Oral Oral  SpO2: 92%  95% 96%  Weight:  80 kg    Height:        Intake/Output Summary (Last 24 hours) at 03/14/2022 1333 Last data filed at 03/14/2022 1130 Gross per 24 hour  Intake 720 ml  Output 1750 ml  Net -1030 ml    Filed Weights   03/12/22 0500 03/13/22 0500 03/14/22 0500  Weight: 80.6 kg 80.6 kg 80 kg     Exam General exam: Appears calm and comfortable  Respiratory system: Clear to auscultation. Respiratory effort normal. Cardiovascular system: S1 & S2 heard, RRR. No JVD,  No pedal edema. Gastrointestinal system: Abdomen is nondistended, soft and nontender. Normal bowel sounds heard. Central nervous system: Alert and oriented to self. Able to move all extremities.  Extremities: edema of the hands.  Skin: No rashes Psychiatry: Mood & affect appropriate.     Data Reviewed:  I have  personally reviewed following labs and imaging studies   CBC Lab Results  Component Value Date   WBC 6.0 03/12/2022   RBC 4.14 03/12/2022   HGB 11.1 (L) 03/12/2022   HCT 33.6 (L) 03/12/2022   MCV 81.2 03/12/2022   MCH 26.8 03/12/2022   PLT 232 03/12/2022   MCHC 33.0 03/12/2022   RDW 15.0 03/12/2022   LYMPHSABS 1.9 03/12/2022   MONOABS 0.5 03/12/2022   EOSABS 0.2 03/12/2022   BASOSABS 0.0 03/12/2022     Last metabolic panel Lab Results  Component Value Date   NA 133 (L) 03/12/2022   K 3.7 03/12/2022   CL 101 03/12/2022   CO2 22 03/12/2022   BUN 9 03/12/2022   CREATININE 0.74 03/12/2022   GLUCOSE 191 (H) 03/12/2022   GFRNONAA >60 03/12/2022   CALCIUM 9.2 03/12/2022   PHOS 4.0 02/25/2022   PROT 6.9 02/25/2022   ALBUMIN 2.7 (L) 02/25/2022   BILITOT 0.4 02/25/2022   ALKPHOS 84 02/25/2022   AST 47 (H) 02/25/2022   ALT 144 (H) 02/25/2022   ANIONGAP 10 03/12/2022    CBG (last 3)  Recent Labs    03/14/22 0315 03/14/22 0802 03/14/22 1121  GLUCAP 138* 102* 171*       Coagulation Profile: No results for input(s): INR, PROTIME in the last 168 hours.   Radiology Studies: No results found.     05/14/22 M.D. [facility] 03/14/2022, 1:33 PM  Available via Epic secure chat 7am-7pm After 7 pm, please refer to night coverage provider listed on amion.

## 2022-03-14 NOTE — Progress Notes (Signed)
Occupational Therapy Treatment Patient Details Name: Cindy Brooks MRN: [id number] DOB: Jul 22, 1971 Today's Date: 03/14/2022   History of present illness Pt is a 51 y.o. female with history of HTN, TIA, ETOH abuse and medication noncompliance presenting to the ED after she was found by her family to be unresponsive in her car on 4/8. 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 Intubated 4/9. Tracheostormy 4/16.  Trach capped 5/16.   OT comments  STAR program OT/PT session:  Caregiver training performed today with patient's daughter and son-in-law for functional transers, bed mobility, and ADL education. Patient's daughter states she is a trained CNA and her sister is EMS.  Patient will continue with STAR program while in this setting to increase independence with self care and functional transfers to decrease burden of care and allow for safe transition to daughters home.    Recommendations for follow up therapy are one component of a multi-disciplinary discharge planning process, led by the attending physician.  Recommendations may be updated based on patient status, additional functional criteria and insurance authorization.    Follow Up Recommendations  Skilled nursing-short term rehab (<3 hours/day)    Assistance Recommended at Discharge Frequent or constant Supervision/Assistance  Patient can return home with the following  Two people to help with walking and/or transfers;Two people to help with bathing/dressing/bathroom;Assistance with cooking/housework;Assistance with feeding;Direct supervision/assist for medications management;Direct supervision/assist for financial management;Assist for transportation;Help with stairs or ramp for entrance   Equipment Recommendations  Other (comment) (TBD)    Recommendations for Other  Services      Precautions / Restrictions Precautions Precautions: Fall Precaution Comments: PEG, stoma Restrictions Weight Bearing Restrictions: No       Mobility Bed Mobility Overal bed mobility: Needs Assistance Bed Mobility: Supine to Sit, Sit to Supine     Supine to sit: Mod assist Sit to supine: Mod assist   General bed mobility comments: caregiver training with daughter and son in law with patient requiring assistance with trunk and LE with less assistance with LEs    Transfers Overall transfer level: Needs assistance Equipment used: 2 person hand held assist Transfers: Sit to/from Stand Sit to Stand: Mod assist, Max assist, +2 physical assistance     Step pivot transfers: Mod assist, Max assist, +2 physical assistance     General transfer comment: transfer performed from recliner to EOB, EOB to [facility], and BSC to recliner with family present for caregiver education.     Balance Overall balance assessment: Needs assistance Sitting-balance support: Feet supported, No upper extremity supported Sitting balance-Leahy Scale: Fair Sitting balance - Comments: posterior lean, minA-minG Postural control: Posterior lean Standing balance support: Bilateral upper extremity supported Standing balance-Leahy Scale: Poor Standing balance comment: mod-maxA of 2 with HHA                           ADL either performed or assessed with clinical judgement   ADL Overall ADL's : Needs assistance/impaired Eating/Feeding: Minimal assistance;Sitting Eating/Feeding Details (indicate cue type and reason): required assistance to hold cup to take drink from straw. Nursing tech states that patient was able to assist with self feeding during breakfast                     Toilet Transfer: +2 for physical assistance;BSC/3in1 Toilet Transfer Details (indicate cue type and reason): 2  person HHA with mod/max assist from EOB to [facility]           General ADL Comments: caregiver  training with daughter and son in law. requested clothing from home to further address dresing    Extremity/Trunk Assessment Upper Extremity Assessment RUE Deficits / Details: grimacing with AAROM, some active movement noted at hand, wrist and elbow. RUE Coordination: decreased fine motor;decreased gross motor LUE Deficits / Details: pain with AAROM, active movements in hand wrist and elbow. required assist to get hand to mouth to complete oral hygiene LUE Sensation: WNL LUE Coordination: decreased gross motor;decreased fine motor            Vision       Perception     Praxis      Cognition Arousal/Alertness: Awake/alert Behavior During Therapy: Flat affect Overall Cognitive Status: Impaired/Different from baseline Area of Impairment: Problem solving, Orientation, Memory, Awareness, Safety/judgement                 Orientation Level: Disoriented to, Time Current Attention Level: Focused Memory: Decreased short-term memory Following Commands: Follows one step commands with increased time Safety/Judgement: Decreased awareness of safety, Decreased awareness of deficits Awareness: Emergent Problem Solving: Slow processing, Requires verbal cues General Comments: improvement with following commands, continues to require cues for safety        Exercises Exercises: General Upper Extremity General Exercises - Upper Extremity Shoulder Flexion: AAROM, Both Shoulder ABduction: AAROM, Both, Seated    Shoulder Instructions       General Comments      Pertinent Vitals/ Pain       Pain Assessment Pain Assessment: Faces Faces Pain Scale: Hurts a little bit Pain Location: BUE shoulders with ROM Pain Descriptors / Indicators: Tightness Pain Intervention(s): Monitored during session  Home Living                                          Prior Functioning/Environment              Frequency  Min 5X/week        Progress Toward Goals  OT  Goals(current goals can now be found in the care plan section)  Progress towards OT goals: Progressing toward goals  Acute Rehab OT Goals OT Goal Formulation: Patient unable to participate in goal setting Time For Goal Achievement: 03/17/22 Potential to Achieve Goals: Good ADL Goals Pt Will Perform Grooming: with mod assist;sitting Pt Will Perform Upper Body Bathing: with mod assist;sitting Pt Will Transfer to Toilet: with max assist;stand pivot transfer;bedside commode Additional ADL Goal #1: Pt will complete bed mobility with mod A as a precursor to ADLs Additional ADL Goal #2: Pt will follow follow simple one step commands 100% of the session to assist in ADLs  Plan Discharge plan remains appropriate    Co-evaluation    PT/OT/SLP Co-Evaluation/Treatment: Yes Reason for Co-Treatment: For patient/therapist safety;To address functional/ADL transfers   OT goals addressed during session: ADL's and self-care      AM-PAC OT "6 Clicks" Daily Activity     Outcome Measure   Help from another person eating meals?: A Little Help from another person taking care of personal grooming?: A Little Help from another person toileting, which includes using toliet, bedpan, or urinal?: Total Help from another person bathing (including washing, rinsing, drying)?: A Lot Help from another person to put on and taking off regular  upper body clothing?: A Lot Help from another person to put on and taking off regular lower body clothing?: A Lot 6 Click Score: 13    End of Session Equipment Utilized During Treatment: Gait belt  OT Visit Diagnosis: Unsteadiness on feet (R26.81);Other abnormalities of gait and mobility (R26.89);Muscle weakness (generalized) (M62.81);Pain Pain - Right/Left: Right Pain - part of body: Shoulder   Activity Tolerance Patient tolerated treatment well   Patient Left in chair;with call bell/phone within reach;with chair alarm set;with family/visitor present   Nurse  Communication Mobility status        Time: [id number] OT Time Calculation (min): 34 min  Charges: OT General Charges $OT Visit: 1 Visit OT Treatments $Therapeutic Activity: 8-22 mins  Lodema Hong, [place]  Pager [phone] Office [place] 03/14/2022, 2:19 PM

## 2022-03-14 NOTE — Progress Notes (Signed)
This chaplain is present for F/U spiritual care and continued Advance Directive:  HCPOA education with the Pt. and Pt. daughter-Keyona. The Pt. is able to express her choice of a healthcare agent.  This chaplain is present with the Pt., Pt. daughter-Keyona, notary, and witnesses for the notarizing of the Pt. AD: HCPOA.  The Pt. named Lonny Prude as her healthcare agent. If this person is unable or unwilling to fulfill this role, the Pt. next choice is Judee Clara.  The chaplain gave the original AD to the Pt and Keyona along with two copies. The chaplain scanned the AD into the Pt. EMR.  This chaplain is available for F/U spiritual care as needed.  Chaplain Sallyanne Kuster [phone]

## 2022-03-14 NOTE — Progress Notes (Signed)
Patient ID: Cindy Brooks, female   DOB: 1971/04/20, 52 y.o.   MRN: [id number]    Progress Note from the Palliative Medicine Team at [facility]   Patient Name: Cindy Brooks        Date: 03/14/2022 DOB: 09-01-1971  Age: 51 y.o. MRN#: [id number] Attending Physician: Kathlen Mody, MD Primary Care Physician: Pcp, No Admit Date: 01/20/2022   Medical records reviewed   51 y.o. female  admitted on  01/20/2022 having been   found outside in her car by family on 4/8 and was moved to the house and left for 5 hours at which time she did not move, transported by EMS to [facility] emergency room, patient was obtunded with loud respirations, copious secretions, difficulty protecting her airway, was intubated      Significant Hospital events   4/9 Admit       -CT head No acute abnormality.        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 Trach placed.  4/18 PEG placed. Spiking fever. Lactate spiked. Got fluid. Cleared.  4/22 Tolerated 6hrs of ATC day prior. No major events overnight, appears that stroke had GOC meeting with family 4/21 with no decision made 4/27 remains on the , unsuccessful wean, remains unresponsive  4/28 trial on trach collar 02/12/22 remains on trach collar, unresponsive to tactile, copious secretions  02-18-22 I was unable to get any response today with commands or gentle touch  02-25-22 patient  was following simple  commands 03-04-22 she is de-cannulated, tolerating dysphagia 1 diet,  6- 1-23 continues to improve and will likely discharge home with family   Family will  face ongoing treatment option decisions, advanced directive decisions and anticipatory care needs.  Recommend outpatient community-based palliative services on discharge  Plan of Care: -Full code -Family is open to all offered and available medical interventions to prolong life. -disposition seems that it will be home with family   I encouraged her family  to continue conversations among themselves and with the medical team as they make decisions into the future and always keeping the patient at the center of those decisions.  Questions and concerns addressed     PMT will continue to support holistically  Lorinda Creed NP  Palliative Medicine Team Team Phone # [phone] Pager [phone]

## 2022-03-15 LAB — GLUCOSE, CAPILLARY
Glucose-Capillary: 152 mg/dL — ABNORMAL HIGH (ref 70–99)
Glucose-Capillary: 154 mg/dL — ABNORMAL HIGH (ref 70–99)
Glucose-Capillary: 177 mg/dL — ABNORMAL HIGH (ref 70–99)
Glucose-Capillary: 82 mg/dL (ref 70–99)
Glucose-Capillary: 94 mg/dL (ref 70–99)
Glucose-Capillary: 103 mg/dL — ABNORMAL HIGH (ref 70–99)

## 2022-03-15 MED ORDER — ENSURE ENLIVE PO LIQD
237.0000 mL | Freq: Three times a day (TID) | ORAL | Status: DC
  Administered 2022-03-15 – 2022-03-20 (×15): 237 mL via ORAL

## 2022-03-15 MED ORDER — FREE WATER
50.0000 mL | Freq: Four times a day (QID) | Status: DC
  Administered 2022-03-15 – 2022-03-21 (×24): 50 mL

## 2022-03-15 NOTE — Progress Notes (Signed)
Inpatient Diabetes Program Recommendations  AACE/ADA: New Consensus Statement on Inpatient Glycemic Control (2015)  Target Ranges:  Prepandial:   less than 140 mg/dL      Peak postprandial:   less than 180 mg/dL (1-2 hours)      Critically ill patients:  140 - 180 mg/dL   Lab Results  Component Value Date   GLUCAP 154 (H) 03/15/2022   HGBA1C 5.7 (H) 01/22/2022    Review of Glycemic Control  Latest Reference Range & Units 03/15/22 03:20 03/15/22 07:33  Glucose-Capillary 70 - 99 mg/dL 831 (H) 517 (H)   Diabetes history: None Outpatient Diabetes medications: None Current orders for Inpatient glycemic control:  Novolog moderate q 4 hours Jevity 55 ml/hr from 6p-6a Inpatient Diabetes Program Recommendations:    Initial A1C in April did not indicate diagnosis of DM. Anticipate that once tube feeds stopped, blood sugars should normalize.  When tube feeds stopped, consider changing Novolog to tid meals.  Thanks,  Beryl Meager, RN, BC-ADM Inpatient Diabetes Coordinator Pager [phone]  (8a-5p)

## 2022-03-15 NOTE — Progress Notes (Signed)
Nutrition Follow-up  DOCUMENTATION CODES:   Not applicable  INTERVENTION:   - d/c nocturnal tube feeds via PEG  - Continue free water flushes of 50 ml q 6 hours via PEG to maintain tube patency  - Continue MVI with minerals daily  - Increase Ensure Enlive po to TID, each supplement provides 350 kcal and 20 grams of protein  NUTRITION DIAGNOSIS:   Inadequate oral intake related to inability to eat as evidenced by NPO status.  Progressing, pt now on dysphagia 2 diet with thin liquids  GOAL:   Patient will meet greater than or equal to 90% of their needs  Progressing  MONITOR:   PO intake, Supplement acceptance, Labs, Weight trends  REASON FOR ASSESSMENT:   Ventilator, Consult Enteral/tube feeding initiation and management  ASSESSMENT:   51 year old female who presented to the ED on 4/09 with AMS. PMH of EtOH abuse, HTN, TIA, medication noncompliance. Pt admitted with acute encephalopathy.  04/09 - pt intubated for airway protection 04/17 - s/p trach 04/18 - s/p PEG 04/19 - pt with new watershed infarcts in B frontal, B parietal, and L temporal lobes 05/16 - trach capped 05/17 - SLP evaluation with recommendation for NPO until MBS is completed 05/18 - s/p MBS with recommendations for dysphagia 1 diet with thin liquids 05/19 - decannulated 05/24 - diet advanced to dysphagia 3 05/25 - diet downgraded to dysphagia 1 per pt request 05/28 - diet advanced to dysphagia 2  Spoke with pt at bedside. Pt reports that she is doing well today and states that she is eating well.  Discussed pt with RN. RN reports that pt continues to eat well with most meals being >/=75% completion. NT assists pt with meals. Per RN, pt enjoys Ensure supplements. Will increase frequency from BID to TID.  RD to d/c nocturnal tube feeds at this time. Will monitor weights and PO intake and evaluate for need to resume nocturnal tube feeds. Will continue free water flushes to maintain PEG tube  patency.  Meal Completion: 50-90%  Current TF: Jevity 1.5 @ 55 ml/hr x 12 hours, free water flushes of 150 ml q 6 hours  Admit weight: 78.7 kg Current weight: 76.8 kg  Pt with moderate pitting edema to BUE and non-pitting edema to BLE.  Medications reviewed and include: colace, Ensure Enlive BID, folic acid, SSI q 4 hours, MVI with minerals daily, senna, thiamine  Vitamin/Mineral Profile: Vitamin B6: 4.6 (WNL) Vitamin B12: 930 (H) Vitamin A: 44.9 (WNL) Zinc: 69 (WNL)  CRP: 7.6 (H) Vitamin B1: 365.8 (H)  Labs reviewed. CBG's: 112-171 x 24 hours  UOP: 2050 ml x 24 hours I/O's: +3.4 L since admit  Diet Order:   Diet Order             DIET DYS 2 Room service appropriate? No; Fluid consistency: Thin  Diet effective now                   EDUCATION NEEDS:   No education needs have been identified at this time  Skin:  Reviewed RN Assessment  Last BM:  03/14/22  Height:   Ht Readings from Last 1 Encounters:  01/20/22 5\' 4"  (1.626 m)    Weight:   Wt Readings from Last 1 Encounters:  03/15/22 76.8 kg    BMI:  Body mass index is 29.06 kg/m.  Estimated Nutritional Needs:   Kcal:  1800-2000  Protein:  90-110 grams  Fluid:  1.8-2.0 L    Gustavus Bryant, MS,  RD, LDN Inpatient Clinical Dietitian Please see AMiON for contact information.

## 2022-03-15 NOTE — Progress Notes (Signed)
Physical Therapy Treatment Patient Details Name: Cindy Brooks MRN: [id number] DOB: 1971-03-17 Today's Date: 03/15/2022   History of Present Illness Pt is a 51 y.o. female with history of HTN, TIA, ETOH abuse and medication noncompliance presenting to the ED after she was found by her family to be unresponsive in her car on 4/8. 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 Intubated 4/9. Tracheostormy 4/16.  Trach capped 5/16.    PT Comments    STAR PT Session: Focus of session functional activities. Pt able to assist in dressing, brushing teeth and washing face with use of Stedy for support. Pt is currently mod-maxAx2 for sit<>stands in [place] and with RW. Pt also able to start ambulation with maxAx2 for steadying and maximal cuing. Pt is making good progress. PT will continue to advance mobility and ADLs to decrease caregiver burden at discharge.     Recommendations for follow up therapy are one component of a multi-disciplinary discharge planning process, led by the attending physician.  Recommendations may be updated based on patient status, additional functional criteria and insurance authorization.  Follow Up Recommendations  Skilled nursing-short term rehab (<3 hours/day)     Assistance Recommended at Discharge Frequent or constant Supervision/Assistance  Patient can return home with the following Two people to help with walking and/or transfers;Two people to help with bathing/dressing/bathroom   Equipment Recommendations  Wheelchair (measurements PT);Wheelchair cushion (measurements PT);Other (comment);Rolling walker (2 wheels);BSC/3in1    Recommendations for Other Services       Precautions / Restrictions Precautions Precautions: Fall Precaution Comments: PEG, stoma Restrictions Weight Bearing Restrictions: No      Mobility  Bed Mobility               General bed mobility comments: OOB in chair on entry    Transfers Overall transfer level: Needs assistance Equipment used: Rolling walker (2 wheels) Transfers: Sit to/from Stand Sit to Stand: Mod assist, Max assist, +2 physical assistance           General transfer comment: maxA-modAx2 for power up in [place] and with RW dependent on foot placement and upright posture for power up, requires assist to attain and maintain balance in standing Transfer via Lift Equipment: Stedy  Ambulation/Gait Ambulation/Gait assistance: Max assist, +2 physical assistance Gait Distance (Feet): 2 Feet (+4') Assistive device: Rolling walker (2 wheels) Gait Pattern/deviations: Step-to pattern, Decreased step length - right, Decreased step length - left, Knee flexed in stance - right, Decreased weight shift to right, Decreased weight shift to left, Knees buckling, Trunk flexed, Narrow base of support Gait velocity: slowed Gait velocity interpretation: <1.31 ft/sec, indicative of household ambulator   General Gait Details: maxAx2 for steadying, vc for upright posture, head up hip rotation, knee extension, increased BoS, pt determination is palpable      Modified Rankin (Stroke Patients Only) Modified Rankin (Stroke Patients Only) Pre-Morbid Rankin Score: No symptoms Modified Rankin: Severe disability     Balance Overall balance assessment: Needs assistance Sitting-balance support: Feet supported, No upper extremity supported Sitting balance-Leahy Scale: Fair Sitting balance - Comments: improved neutral sitting position   Standing balance support: Bilateral upper extremity supported Standing balance-Leahy Scale: Poor Standing balance comment: mod-maxA of 2 with HHA                            Cognition Arousal/Alertness: Awake/alert  Behavior During Therapy: Flat affect Overall Cognitive Status: Impaired/Different from baseline Area  of Impairment: Problem solving, Orientation, Memory, Awareness, Safety/judgement                 Orientation Level: Disoriented to, Time Current Attention Level: Sustained Memory: Decreased short-term memory Following Commands: Follows one step commands with increased time, Follows multi-step commands with increased time Safety/Judgement: Decreased awareness of safety Awareness: Emergent Problem Solving: Slow processing, Requires verbal cues General Comments: continues to have decreased safety awareness, however has improved sequencing and problem solving and is able to follow multistep commands more consistently        Exercises      General Comments General comments (skin integrity, edema, etc.): pt is determined to perform ADLs, happy to have more normal clothing on,      Pertinent Vitals/Pain Pain Assessment Pain Assessment: Faces Faces Pain Scale: Hurts a little bit Pain Location: peg tube placement, grimace with pulling up with B UE Pain Descriptors / Indicators: Restless, Discomfort Pain Intervention(s): Limited activity within patient's tolerance, Monitored during session, Repositioned    Home Living                          Prior Function            PT Goals (current goals can now be found in the care plan section) Acute Rehab PT Goals PT Goal Formulation: With patient Time For Goal Achievement: 03/25/22 Potential to Achieve Goals: Good Progress towards PT goals: Progressing toward goals    Frequency    Min 5X/week      PT Plan Current plan remains appropriate    Co-evaluation PT/OT/SLP Co-Evaluation/Treatment: Yes Reason for Co-Treatment: For patient/therapist safety PT goals addressed during session: Mobility/safety with mobility OT goals addressed during session: ADL's and self-care      AM-PAC PT "6 Clicks" Mobility   Outcome Measure  Help needed turning from your back to your side while in a flat bed without using  bedrails?: A Lot Help needed moving from lying on your back to sitting on the side of a flat bed without using bedrails?: A Lot Help needed moving to and from a bed to a chair (including a wheelchair)?: Total Help needed standing up from a chair using your arms (e.g., wheelchair or bedside chair)?: Total Help needed to walk in hospital room?: Total Help needed climbing 3-5 steps with a railing? : Total 6 Click Score: 8    End of Session Equipment Utilized During Treatment: Gait belt Activity Tolerance: Patient tolerated treatment well Patient left: in chair;with call bell/phone within reach;with chair alarm set Nurse Communication: Mobility status PT Visit Diagnosis: Other abnormalities of gait and mobility (R26.89);Other symptoms and signs involving the nervous system (R29.898)     Time: 0920-1010 PT Time Calculation (min) (ACUTE ONLY): 50 min  Charges:  $Gait Training: 8-22 mins                     Darryl Willner B. Migdalia Dk PT, DPT Acute Rehabilitation Services Please use secure chat or  Call Office [phone]    [place] 03/15/2022, 11:08 AM

## 2022-03-15 NOTE — TOC Progression Note (Addendum)
Transition of Care [facility]) - Progression Note    Patient Details  Name: Cindy Brooks MRN: [id number] Date of Birth: 03-23-1971  Transition of Care [facility] CM/SW Contact  Janae Bridgeman, RN Phone Number: 03/15/2022, 10:19 AM  Clinical Narrative:    CM called and scheduled a hospital follow up with [facility] with Dr. Loreen Freud, on April 17, 2022 at 9:30 am.  Follow up visit is scheduled in the discharge instructions.  Patient is a newly diagnosed diabetic - and diabetes coordinator placed note this morning.  The patient's daughter will need glucometer and insulin administration teaching.  Message sent to bedside nursing for supportive follow up for teaching with family.  I called and spoke with the daughter, [name] and she states that she ordered a glucometer from [employer] and plans to order a 2-wheeled rolling walker as well for home.   CM and MSW along with STAR Team are continuing to follow the patient for discharge to home with family next Thursday, 6/8.   Expected Discharge Plan: Home w Home Health Services Barriers to Discharge: Inadequate or no insurance  Expected Discharge Plan and Services Expected Discharge Plan: Home w Home Health Services In-house Referral: PCP / [facility] Discharge Planning Services: CM Consult, Follow-up appt scheduled, Medication Assistance Post Acute Care Choice: Home Health Living arrangements for the past 2 months: Single Family Home                           HH Arranged: PT   Date HH Agency Contacted: 03/13/22 Time HH Agency Contacted: 0901 Representative spoke with at [facility] Agency: [facility]   Social Determinants of Health (SDOH) Interventions    Readmission Risk Interventions    03/13/2022    9:03 AM  Readmission Risk Prevention Plan  Transportation Screening Complete  PCP or Specialist Appt within 5-7 Days Complete  Home Care Screening Complete  Medication Review (RN CM)  Complete

## 2022-03-15 NOTE — Progress Notes (Signed)
Speech Language Pathology Treatment: Cognitive-Linquistic  Patient Details Name: Cindy Brooks MRN: [id number] DOB: [birth year] Today's Date: 03/15/2022 Time: [id number] SLP Time Calculation (min) (ACUTE ONLY): 25 min  Assessment / Plan / Recommendation Clinical Impression  Pt seen for cognitive/linguistic treatment with pt being oriented to self only, but disoriented to place, situation or time despite mod cueing provided bySLP (ie: calendar, phrase completion, etc.).  Pt named familiar objects with 80% accuracy, but function of items only 20% accurate with mod verbal cues provided.  Pt completed phrases with 40% for simple phrases such as "salt and pepper" and "macaroni and cheese."  Pt answered simple personal information questions such as "What city do you live in?" and "How many children do you have?" with inconsistent information provided and/or multiple answers given with an overall accuracy of 40% obtained.  Pt would often fill conversation with hesitations/fillers and/or perseverate on answers previously given during conversational turns. Overall decreased processing of information/response to questions continues.   Pt read "Today is" from portable calendar within room and identified "2", but was unable to read other words in bold print such as "June" despite min visual cues provided with an overall accuracy for reading of 30%.  Pt distracted by television/items in room intermittently and required redirection to focus on tasks.  Pt refused any PO intake this session, so diet was not able to be upgraded at this time.  ST will continue to f/u for cog/linguistic and dysphagia needs during acute stay.  No family available during this session to discuss education re: cognitive/linguistic needs and current POC/goals for ST.  HPI HPI: Pt is a 51 y/o female who presented to the ED after being found unresponsive in her care. Pt admitted 4/9 with thalamic stroke, treated for possible acute metabolic and  wernicke's encphalopathy as well. She required intubation for airway protection on admission and had a tracheostomy placed on 4/16.  PEG on 4/18.Decannulated 5/19. MRI 4/19: Multiple foci of restricted diffusion in the bilateral frontal lobes and parietal lobes primarily.  Has suffered from severe encephalopathy, neurostorming. Started following commands around 5/15. PMH: HTN, TIA, ETOH abuse and medication noncompliance; ST f/u for cog/linguistic and dysphagia needs in acute setting.      SLP Plan  Continue with current plan of care      Recommendations for follow up therapy are one component of a multi-disciplinary discharge planning process, led by the attending physician.  Recommendations may be updated based on patient status, additional functional criteria and insurance authorization.    Recommendations  Diet recommendations: Dysphagia 2 (fine chop);Thin liquid Liquids provided via: Cup;Straw                Oral Care Recommendations: Oral care BID;Staff/trained caregiver to provide oral care Follow Up Recommendations: Follow physician's recommendations for discharge plan and follow up therapies Assistance recommended at discharge: Frequent or constant Supervision/Assistance SLP Visit Diagnosis: Cognitive communication deficit [id number]) Plan: Continue with current plan of care           Elvina Sidle, M.S., CCC-SLP  03/15/2022, 3:11 PM

## 2022-03-15 NOTE — Progress Notes (Signed)
[facility]                                                                               Cindy Brooks, is a 51 y.o. female, DOB - March 26, 1971, [id number] Admit date - 01/20/2022    Outpatient Primary MD for the patient is Pcp, No  LOS - 65  days    Brief summary   Patient is a 51 year old female with history of uncontrolled hypertension, history of TIA who presented to the [facility] emergency department on 4/9 after being found unresponsive by his family.  Patient was found to be obtunded with copious secretions, subsequently intubated on 4/9 for airway protection.  Hospital course remarkable for uncontrolled hypertension, work-up also showed thalamic stroke.  Wernicke's encephalopathy also suspected.  Transferred to [facility] service.    PCCM was consulted,she has been de-cannulated on 5/19. Awaiting further evaluation from [place].  4/9 Admit  4/12 ongoing high dose thiamine for possible Wernike's encephalopathy  4/13 IV sedation stopped, phenobarbital wean 4/17 still not waking up. Trach placed.  4/18 PEG placed. Spiking fever. Lactate spiked. Got fluid. Cleared.  4/22 Tolerated 6hrs of ATC day prior. No major events overnight, appears that stroke had [place] meeting with family 4/21 with no decision made 4/23 No issues overnight, on vent this am, remains unresponsive  4/27 changed to propranolol from metoprolol for neuro-storming. Started on vanc and cefepime for possible HCAP  4/28 trial on open ended trach collar 5/2 patient transferred to Hospitalist Service.  5/14 waking up and following simple commands. 5/15 downsized tracheostomy to #4 5/16 tracheostomy capping trials. 5/19 Decannulation 5/25 Foley placement for urinary retention Plan for voiding trial tomorrow.    Assessment & Plan    Assessment and Plan:    Acute metabolic encephalopathy:  - Multifactorial: Thalamic stroke, Warnicke's encephalopathy.  -MRI brain on 4/11 showed patchy restricted diffusion  on both thalami and midbrain. 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  S/p high dose thiamine IV, followed by oral thiamine.  -Patient was also on phenobarbital which was weaned off. -Status post trach, PEG placement.   -Remains on amantadine, gabapentin. - she is alert and following simple commands.    Acute hypoxic respiratory failure:  -Intubated on presentation for airway protection.  Prolonged ventilator use/tracheostomy.  Hospital course also remarkable for healthcare associated pneumonia, treated with antibiotics, completed the course.   -decannulated on 5/19 PCCM signed off now on RA   Thalamic stroke: continue with plavix and statin.    Phospholipid antibody with hypercoagulable state:  -Positive anticardiolipin IgM, lupus anticoagulant: - -Continue Plavix.  Start Lovenox for prophylaxis on 5/28.   Dysphagia: Status post PEG on 4/15. -Speech therapy closely following, recommending dysphagia 2 diet with thin liquid Nocturnal feeds discontinued today. To encourage oral feeding.    Hypertension: BP parameters are optimal.    History of hyperlipidemia: On Crestor   Obesity: BMI of 30.3 Outpatient follow up with Pcp.    Acute urinary retention: Foley placed 5/25.  Started her  on flomax and plan for voiding trial in a day or two.    Left Knee Pain: noted 5/30, L knee swelling and tenderness, pt c/o severe pain.  Xray pending.  Topical voltaren and ice packs PRN.  Continue PT as tolerated.      Malnutrition Type:  Nutrition Problem: Inadequate oral intake Etiology: inability to eat   Malnutrition Characteristics:  Signs/Symptoms: NPO status   Nutrition Interventions:  Interventions: Ensure Enlive (each supplement provides 350kcal and 20 grams of protein), MVI  Estimated  body mass index is 29.06 kg/m as calculated from the following:   Height as of this encounter: 5\' 4"  (1.626 m).   Weight as of this encounter: 76.8 kg.  Code Status: full code.  DVT Prophylaxis:  enoxaparin (LOVENOX) injection 40 mg Start: 03/10/22 1045 SCDs Start: 01/20/22 1012   Level of Care: Level of care: med surg.  Family Communication: none at bedside.   Disposition Plan:     Remains inpatient appropriate:plan for discharge home with home health.    Antimicrobials:   Anti-infectives (From admission, onward)    Start     Dose/Rate Route Frequency Ordered Stop   02/08/22 1800  vancomycin (VANCOREADY) IVPB 1500 mg/300 mL  Status:  Discontinued        1,500 mg 150 mL/hr over 120 Minutes Intravenous Every 24 hours 02/07/22 1712 02/09/22 1046   02/07/22 1800  vancomycin (VANCOREADY) IVPB 1750 mg/350 mL        1,750 mg 175 mL/hr over 120 Minutes Intravenous  Once 02/07/22 1708 02/07/22 2046   02/07/22 1700  ceFEPIme (MAXIPIME) 2 g in sodium chloride 0.9 % 100 mL IVPB        2 g 200 mL/hr over 30 Minutes Intravenous Every 8 hours 02/07/22 1706 02/14/22 0856   01/20/22 2000  cefTRIAXone (ROCEPHIN) 2 g in sodium chloride 0.9 % 100 mL IVPB        2 g 200 mL/hr over 30 Minutes Intravenous Every 24 hours 01/20/22 0634 01/24/22 2022   01/20/22 0415  cefTRIAXone (ROCEPHIN) 2 g in sodium chloride 0.9 % 100 mL IVPB        2 g 200 mL/hr over 30 Minutes Intravenous  Once 01/20/22 0414 01/20/22 0537        Medications  Scheduled Meds:  amantadine  100 mg Per Tube BID   amLODipine  10 mg Per Tube Daily   Chlorhexidine Gluconate Cloth  6 each Topical Daily   clopidogrel  75 mg Per Tube Daily   diclofenac Sodium  4 g Topical QID   docusate  100 mg Per Tube BID   enoxaparin (LOVENOX) injection  40 mg Subcutaneous Q24H   feeding supplement  237 mL Oral TID BM   folic acid  1 mg Per Tube Daily   free water  50 mL Per Tube Q6H   gabapentin  100 mg Per Tube Q12H   glycopyrrolate  1  mg Per Tube BID   guaiFENesin  10 mL Per Tube Q6H   hydrALAZINE  50 mg Per Tube Q8H   insulin aspart  0-15 Units Subcutaneous Q4H   multivitamin with minerals  1 tablet Per Tube Daily   propranolol  40 mg Per Tube TID   rosuvastatin  40 mg Per Tube Daily   senna  1 tablet Per Tube Daily   tamsulosin  0.4 mg Oral QPC supper   thiamine  100 mg Per Tube Daily   Continuous Infusions:  sodium chloride Stopped (02/11/22 0928)  PRN Meds:.sodium chloride, acetaminophen (TYLENOL) oral liquid 160 mg/5 mL, Gerhardt's butt cream, ipratropium-albuterol, labetalol, metoprolol tartrate, polyethylene glycol, promethazine    Subjective:   Cindy Brooks was seen and examined today. No events overnight.   Objective:   Vitals:   03/15/22 0500 03/15/22 0734 03/15/22 1115 03/15/22 1608  BP:  (!) 155/77 114/81 125/76  Pulse:  96 82 87  Resp:  18 18   Temp:  99.7 F (37.6 C) 98.5 F (36.9 C) 98.4 F (36.9 C)  TempSrc:  Oral Oral Oral  SpO2:  91% 96% 97%  Weight: 76.8 kg     Height:        Intake/Output Summary (Last 24 hours) at 03/15/2022 1610 Last data filed at 03/15/2022 0900 Gross per 24 hour  Intake 620 ml  Output 1550 ml  Net -930 ml    Filed Weights   03/13/22 0500 03/14/22 0500 03/15/22 0500  Weight: 80.6 kg 80 kg 76.8 kg     Exam General exam: Appears calm and comfortable  Respiratory system: Clear to auscultation. Respiratory effort normal. Cardiovascular system: S1 & S2 heard, RRR. No JVD,  No pedal edema. Gastrointestinal system: Abdomen is nondistended, soft and nontender.  Normal bowel sounds heard. Central nervous system: Alert and oriented to self. Moving all extremities.  Extremities: Symmetric 5 x 5 power. Skin: No rashes, lesions or ulcers Psychiatry: Mood & affect appropriate.     Data Reviewed:  I have personally reviewed following labs and imaging studies   CBC Lab Results  Component Value Date   WBC 6.0 03/12/2022   RBC 4.14 03/12/2022   HGB 11.1  (L) 03/12/2022   HCT 33.6 (L) 03/12/2022   MCV 81.2 03/12/2022   MCH 26.8 03/12/2022   PLT 232 03/12/2022   MCHC 33.0 03/12/2022   RDW 15.0 03/12/2022   LYMPHSABS 1.9 03/12/2022   MONOABS 0.5 03/12/2022   EOSABS 0.2 03/12/2022   BASOSABS 0.0 99991111     Last metabolic panel Lab Results  Component Value Date   NA 133 (L) 03/12/2022   K 3.7 03/12/2022   CL 101 03/12/2022   CO2 22 03/12/2022   BUN 9 03/12/2022   CREATININE 0.74 03/12/2022   GLUCOSE 191 (H) 03/12/2022   GFRNONAA >60 03/12/2022   CALCIUM 9.2 03/12/2022   PHOS 4.0 02/25/2022   PROT 6.9 02/25/2022   ALBUMIN 2.7 (L) 02/25/2022   BILITOT 0.4 02/25/2022   ALKPHOS 84 02/25/2022   AST 47 (H) 02/25/2022   ALT 144 (H) 02/25/2022   ANIONGAP 10 03/12/2022    CBG (last 3)  Recent Labs    03/15/22 0320 03/15/22 0733 03/15/22 1114  GLUCAP 152* 154* 177*       Coagulation Profile: No results for input(s): INR, PROTIME in the last 168 hours.   Radiology Studies: No results found.     Hosie Poisson M.D. [facility] 03/15/2022, 4:10 PM  Available via Epic secure chat 7am-7pm After 7 pm, please refer to night coverage provider listed on amion.

## 2022-03-15 NOTE — Progress Notes (Signed)
Occupational Therapy Treatment Patient Details Name: Cindy Brooks MRN: [id number] DOB: [birth year] Today's Date: 03/15/2022   History of present illness Pt is a 51 y.o. female with history of HTN, TIA, ETOH abuse and medication noncompliance presenting to the ED after she was found by her family to be unresponsive in her car on 4/8. 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 Intubated 4/9. Tracheostormy 4/16.  Trach capped 5/16.   OT comments  STAR program OT/PT session: Patient making good progress with OT/PT treatment with improvement with sit to stands, dressing, and grooming tasks. Patient address dressing with donning pullover top and paper scrub bottoms with mod assist for UB and mod/max with LB. Patient performed grooming tasks in stedy at sink resting on pads.  Mobility and standing addressed with stedy, 2 person HHA, and RW with 2 person assistance and a third for chair follow. Patient to continue with STAR program.    Recommendations for follow up therapy are one component of a multi-disciplinary discharge planning process, led by the attending physician.  Recommendations may be updated based on patient status, additional functional criteria and insurance authorization.    Follow Up Recommendations  Skilled nursing-short term rehab (<3 hours/day)    Assistance Recommended at Discharge Frequent or constant Supervision/Assistance  Patient can return home with the following  Two people to help with walking and/or transfers;Two people to help with bathing/dressing/bathroom;Assistance with cooking/housework;Assistance with feeding;Direct supervision/assist for medications management;Direct supervision/assist for financial management;Assist for transportation;Help with stairs or ramp for entrance   Equipment Recommendations   BSC/3in1    Recommendations for Other Services      Precautions / Restrictions Precautions Precautions: Fall Precaution Comments: PEG, stoma Restrictions Weight Bearing Restrictions: No       Mobility Bed Mobility Overal bed mobility: Needs Assistance             General bed mobility comments: up in recliner    Transfers Overall transfer level: Needs assistance Equipment used: 2 person hand held assist, Rolling walker (2 wheels), Ambulation equipment used Transfers: Sit to/from Stand, Bed to chair/wheelchair/BSC Sit to Stand: Mod assist, +2 physical assistance           General transfer comment: Patient stood at steady and performed grooming tasks at sink and transferred to recliner. Patient took steps with 2 person HHA and with RW Transfer via Lift Equipment: Stedy   Balance Overall balance assessment: Needs assistance Sitting-balance support: Feet supported, No upper extremity supported Sitting balance-Leahy Scale: Fair     Standing balance support: Bilateral upper extremity supported Standing balance-Leahy Scale: Poor Standing balance comment: mod assist +2 with HHA and RW                           ADL either performed or assessed with clinical judgement   ADL Overall ADL's : Needs assistance/impaired     Grooming: Wash/dry hands;Wash/dry face;Oral care;Minimal assistance Grooming Details (indicate cue type and reason): performed at sink in stedy resting on stedy pads         Upper Body Dressing : Moderate assistance;Sitting Upper Body Dressing Details (indicate cue type and reason): pullover top Lower Body Dressing: Moderate assistance;Maximal assistance;Sit to/from stand Lower Body Dressing Details (indicate cue type and reason): required assistance to thread legs into clothing and to pull up while standing. Patient activily particpating  General ADL Comments: increased participation with self care     Extremity/Trunk Assessment Upper Extremity Assessment RUE Deficits / Details: grimacing with AAROM, some active movement noted at hand, wrist and elbow. RUE Sensation: WNL RUE Coordination: decreased fine motor;decreased gross motor LUE Deficits / Details: pain with AAROM, active movements in hand wrist and elbow. required assist to get hand to mouth to complete oral hygiene LUE Sensation: WNL LUE Coordination: decreased gross motor;decreased fine motor            Vision       Perception     Praxis      Cognition Arousal/Alertness: Awake/alert Behavior During Therapy: Flat affect Overall Cognitive Status: Impaired/Different from baseline Area of Impairment: Problem solving, Orientation, Memory, Awareness, Safety/judgement                 Orientation Level: Disoriented to, Time Current Attention Level: Focused Memory: Decreased short-term memory Following Commands: Follows one step commands with increased time Safety/Judgement: Decreased awareness of safety, Decreased awareness of deficits Awareness: Emergent Problem Solving: Slow processing, Requires verbal cues General Comments: more vocal on this date. Motivated towards therapy.        Exercises      Shoulder Instructions       General Comments      Pertinent Vitals/ Pain       Pain Assessment Pain Assessment: Faces Faces Pain Scale: Hurts a little bit Pain Location: discomfort when sitting in recliner Pain Descriptors / Indicators: Discomfort Pain Intervention(s): Monitored during session, Repositioned  Home Living                                          Prior Functioning/Environment              Frequency  Min 5X/week        Progress Toward Goals  OT Goals(current goals can now be found in the care plan section)  Progress towards OT goals: Progressing toward goals  Acute Rehab OT Goals OT Goal Formulation: Patient unable to participate in goal setting Time  For Goal Achievement: 03/17/22 Potential to Achieve Goals: Good ADL Goals Pt Will Perform Grooming: with mod assist;sitting Pt Will Perform Upper Body Bathing: with mod assist;sitting Pt Will Transfer to Toilet: with max assist;stand pivot transfer;bedside commode Additional ADL Goal #1: Pt will complete bed mobility with mod A as a precursor to ADLs Additional ADL Goal #2: Pt will follow follow simple one step commands 100% of the session to assist in ADLs  Plan Discharge plan remains appropriate    Co-evaluation    PT/OT/SLP Co-Evaluation/Treatment: Yes Reason for Co-Treatment: For patient/therapist safety;To address functional/ADL transfers   OT goals addressed during session: ADL's and self-care      AM-PAC OT "6 Clicks" Daily Activity     Outcome Measure   Help from another person eating meals?: A Little Help from another person taking care of personal grooming?: A Little Help from another person toileting, which includes using toliet, bedpan, or urinal?: Total Help from another person bathing (including washing, rinsing, drying)?: A Lot Help from another person to put on and taking off regular upper body clothing?: A Lot Help from another person to put on and taking off regular lower body clothing?: A Lot 6 Click Score: 13    End of Session Equipment Utilized During Treatment: Gait belt;Rolling walker (2 wheels);Other (comment) Antony Salmon(Stedy)  OT Visit Diagnosis: Unsteadiness on feet (R26.81);Other abnormalities of gait and mobility (R26.89);Muscle weakness (generalized) (M62.81);Pain   Activity Tolerance Patient tolerated treatment well   Patient Left in chair;with call bell/phone within reach;with chair alarm set   Nurse Communication Mobility status        Time: 0920-1010 OT Time Calculation (min): 50 min  Charges: OT General Charges $OT Visit: 1 Visit OT Treatments $Self Care/Home Management : 23-37 mins  Alfonse Flavors, OTA Acute Rehabilitation Services  Pager  [phone] Office [phone]   Dewain Penning 03/15/2022, 10:55 AM

## 2022-03-16 LAB — GLUCOSE, CAPILLARY
Glucose-Capillary: 106 mg/dL — ABNORMAL HIGH (ref 70–99)
Glucose-Capillary: 90 mg/dL (ref 70–99)
Glucose-Capillary: 143 mg/dL — ABNORMAL HIGH (ref 70–99)
Glucose-Capillary: 98 mg/dL (ref 70–99)
Glucose-Capillary: 113 mg/dL — ABNORMAL HIGH (ref 70–99)
Glucose-Capillary: 134 mg/dL — ABNORMAL HIGH (ref 70–99)

## 2022-03-16 LAB — CBC WITH DIFFERENTIAL/PLATELET
Abs Immature Granulocytes: 0.02 10*3/uL (ref 0.00–0.07)
Basophils Absolute: 0 10*3/uL (ref 0.0–0.1)
Basophils Relative: 1 %
Eosinophils Absolute: 0.1 10*3/uL (ref 0.0–0.5)
Eosinophils Relative: 2 %
HCT: 33.7 % — ABNORMAL LOW (ref 36.0–46.0)
Hemoglobin: 11.2 g/dL — ABNORMAL LOW (ref 12.0–15.0)
Immature Granulocytes: 0 %
Lymphocytes Relative: 34 %
Lymphs Abs: 2.1 10*3/uL (ref 0.7–4.0)
MCH: 27.3 pg (ref 26.0–34.0)
MCHC: 33.2 g/dL (ref 30.0–36.0)
MCV: 82 fL (ref 80.0–100.0)
Monocytes Absolute: 0.7 10*3/uL (ref 0.1–1.0)
Monocytes Relative: 11 %
Neutro Abs: 3.1 10*3/uL (ref 1.7–7.7)
Neutrophils Relative %: 52 %
Platelets: 291 10*3/uL (ref 150–400)
RBC: 4.11 MIL/uL (ref 3.87–5.11)
RDW: 15.7 % — ABNORMAL HIGH (ref 11.5–15.5)
WBC: 6 10*3/uL (ref 4.0–10.5)
nRBC: 0 % (ref 0.0–0.2)

## 2022-03-16 LAB — HEPATIC FUNCTION PANEL
ALT: 184 U/L — ABNORMAL HIGH (ref 0–44)
AST: 70 U/L — ABNORMAL HIGH (ref 15–41)
Albumin: 3.5 g/dL (ref 3.5–5.0)
Alkaline Phosphatase: 107 U/L (ref 38–126)
Bilirubin, Direct: 0.1 mg/dL (ref 0.0–0.2)
Indirect Bilirubin: 0.6 mg/dL (ref 0.3–0.9)
Total Bilirubin: 0.7 mg/dL (ref 0.3–1.2)
Total Protein: 7.5 g/dL (ref 6.5–8.1)

## 2022-03-16 LAB — BASIC METABOLIC PANEL
Anion gap: 11 (ref 5–15)
BUN: 8 mg/dL (ref 6–20)
CO2: 22 mmol/L (ref 22–32)
Calcium: 9.7 mg/dL (ref 8.9–10.3)
Chloride: 104 mmol/L (ref 98–111)
Creatinine, Ser: 0.69 mg/dL (ref 0.44–1.00)
GFR, Estimated: >60 mL/min (ref >60)
Glucose, Bld: 98 mg/dL (ref 70–99)
Potassium: 4.1 mmol/L (ref 3.5–5.1)
Sodium: 137 mmol/L (ref 135–145)

## 2022-03-16 NOTE — Progress Notes (Signed)
[facility]                                                                               Cindy Brooks, is a 51 y.o. female, DOB - October 11, 1971, [id number] Admit date - 01/20/2022    Outpatient Primary MD for the patient is Pcp, No  LOS - 2  days    Brief summary   Patient is a 51 year old female with history of uncontrolled hypertension, history of TIA who presented to the [facility] emergency department on 4/9 after being found unresponsive by his family.  Patient was found to be obtunded with copious secretions, subsequently intubated on 4/9 for airway protection.  Hospital course remarkable for uncontrolled hypertension, work-up also showed thalamic stroke.  Wernicke's encephalopathy also suspected.  Transferred to [facility] service.    PCCM was consulted,she has been de-cannulated on 5/19. Awaiting further evaluation from [place].  4/9 Admit  4/12 ongoing high dose thiamine for possible Wernike's encephalopathy  4/13 IV sedation stopped, phenobarbital wean 4/17 still not waking up. Trach placed.  4/18 PEG placed. Spiking fever. Lactate spiked. Got fluid. Cleared.  4/22 Tolerated 6hrs of ATC day prior. No major events overnight, appears that stroke had [place] meeting with family 4/21 with no decision made 4/23 No issues overnight, on vent this am, remains unresponsive  4/27 changed to propranolol from metoprolol for neuro-storming. Started on vanc and cefepime for possible HCAP  4/28 trial on open ended trach collar 5/2 patient transferred to Hospitalist Service.  5/14 waking up and following simple commands. 5/15 downsized tracheostomy to #4 5/16 tracheostomy capping trials. 5/19 Decannulation 5/25 Foley placement for urinary retention Plan voiding trial today.    Assessment & Plan    Assessment and Plan:    Acute metabolic encephalopathy:  - Multifactorial: Thalamic stroke, Warnicke's encephalopathy.  -MRI brain on 4/11 showed patchy restricted diffusion on both  thalami and midbrain. 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  S/p high dose thiamine IV, followed by oral thiamine.  -Patient was also on phenobarbital which was weaned off. -Status post trach, PEG placement.   -Remains on amantadine, gabapentin. - she is alert and following simple commands, talking a little.    Acute hypoxic respiratory failure:  -Intubated on presentation for airway protection.  Prolonged ventilator use/tracheostomy.  Hospital course also remarkable for healthcare associated pneumonia, treated with antibiotics, completed the course.  repeat CXR does not show any acute cardiopulm disease.  -decannulated on 5/19 PCCM signed off now on RA   Thalamic stroke: continue with plavix and statin.    Phospholipid antibody with hypercoagulable state:  -Positive anticardiolipin IgM, lupus anticoagulant: - -Continue Plavix.  Start Lovenox for prophylaxis on 5/28.   Dysphagia: Status post PEG on 4/15. -Speech therapy closely following, recommending dysphagia 2 diet with thin liquid Nocturnal feeds discontinued on 03/15/22 To encourage oral feeding.    Hypertension: BP parameters are well controlled.    History of hyperlipidemia: On Crestor   Obesity: BMI of 30.3 Outpatient follow up with  Pcp.    Acute urinary retention: Foley placed 5/25.  Started her on flomax , voiding trial today.    Left Knee Pain: noted 5/30, L knee swelling and tenderness, pt c/o severe pain.   x ray showed severe degenerative changes. Topical voltaren and ice packs PRN.  Continue PT as tolerated.      Malnutrition Type:  Nutrition Problem: Inadequate oral intake Etiology: inability to eat   Malnutrition Characteristics:  Signs/Symptoms: NPO status   Nutrition Interventions:  Interventions: Ensure  Enlive (each supplement provides 350kcal and 20 grams of protein), MVI  Estimated body mass index is 29.86 kg/m as calculated from the following:   Height as of this encounter: 5\' 4"  (1.626 m).   Weight as of this encounter: 78.9 kg.  Code Status: full code.  DVT Prophylaxis:  enoxaparin (LOVENOX) injection 40 mg Start: 03/10/22 1045 SCDs Start: 01/20/22 1012   Level of Care: Level of care: med surg.  Family Communication: none at bedside.   Disposition Plan:     Remains inpatient appropriate:plan for discharge home with home health next week, when DME is delivered.    Antimicrobials:   Anti-infectives (From admission, onward)    Start     Dose/Rate Route Frequency Ordered Stop   02/08/22 1800  vancomycin (VANCOREADY) IVPB 1500 mg/300 mL  Status:  Discontinued        1,500 mg 150 mL/hr over 120 Minutes Intravenous Every 24 hours 02/07/22 1712 02/09/22 1046   02/07/22 1800  vancomycin (VANCOREADY) IVPB 1750 mg/350 mL        1,750 mg 175 mL/hr over 120 Minutes Intravenous  Once 02/07/22 1708 02/07/22 2046   02/07/22 1700  ceFEPIme (MAXIPIME) 2 g in sodium chloride 0.9 % 100 mL IVPB        2 g 200 mL/hr over 30 Minutes Intravenous Every 8 hours 02/07/22 1706 02/14/22 0856   01/20/22 2000  cefTRIAXone (ROCEPHIN) 2 g in sodium chloride 0.9 % 100 mL IVPB        2 g 200 mL/hr over 30 Minutes Intravenous Every 24 hours 01/20/22 0634 01/24/22 2022   01/20/22 0415  cefTRIAXone (ROCEPHIN) 2 g in sodium chloride 0.9 % 100 mL IVPB        2 g 200 mL/hr over 30 Minutes Intravenous  Once 01/20/22 0414 01/20/22 0537        Medications  Scheduled Meds:  amantadine  100 mg Per Tube BID   amLODipine  10 mg Per Tube Daily   Chlorhexidine Gluconate Cloth  6 each Topical Daily   clopidogrel  75 mg Per Tube Daily   diclofenac Sodium  4 g Topical QID   docusate  100 mg Per Tube BID   enoxaparin (LOVENOX) injection  40 mg Subcutaneous Q24H   feeding supplement  237 mL Oral TID BM   folic  acid  1 mg Per Tube Daily   free water  50 mL Per Tube Q6H   gabapentin  100 mg Per Tube Q12H   glycopyrrolate  1 mg Per Tube BID   guaiFENesin  10 mL Per Tube Q6H   hydrALAZINE  50 mg Per Tube Q8H   insulin aspart  0-15 Units Subcutaneous Q4H   multivitamin with minerals  1 tablet Per Tube Daily   propranolol  40 mg Per Tube TID   rosuvastatin  40 mg Per Tube Daily   senna  1 tablet Per Tube Daily   tamsulosin  0.4 mg Oral QPC supper  thiamine  100 mg Per Tube Daily   Continuous Infusions:  sodium chloride Stopped (02/11/22 0928)   PRN Meds:.sodium chloride, acetaminophen (TYLENOL) oral liquid 160 mg/5 mL, Gerhardt's butt cream, ipratropium-albuterol, labetalol, metoprolol tartrate, polyethylene glycol, promethazine    Subjective:   Cindy Brooks was seen and examined today. No new complaints.   Objective:   Vitals:   03/15/22 2308 03/16/22 0308 03/16/22 0318 03/16/22 0742  BP: 111/76 122/77  129/87  Pulse: 82 84  92  Resp: 18 16    Temp: 99.1 F (37.3 C) 98.5 F (36.9 C)  98.5 F (36.9 C)  TempSrc: Axillary   Oral  SpO2: 100% 96%  92%  Weight:   78.9 kg   Height:        Intake/Output Summary (Last 24 hours) at 03/16/2022 1027 Last data filed at 03/16/2022 0321 Gross per 24 hour  Intake 360 ml  Output 1450 ml  Net -1090 ml    Filed Weights   03/14/22 0500 03/15/22 0500 03/16/22 0318  Weight: 80 kg 76.8 kg 78.9 kg     Exam General exam: Appears calm and comfortable  Respiratory system: Clear to auscultation. Respiratory effort normal. Cardiovascular system: S1 & S2 heard, RRR. No JVD,No pedal edema. Gastrointestinal system: Abdomen is nondistended, soft and nontender. Normal bowel sounds heard. PEG in place.  Central nervous system: Alert and oriented to self only. Able to move all extremities.  Speaking a few words, slow to respond to questions.  Extremities: bilateral hand edema is improving.  Skin: No rashes, lesions or ulcers Psychiatry: Mood & affect  appropriate.      Data Reviewed:  I have personally reviewed following labs and imaging studies   CBC Lab Results  Component Value Date   WBC 6.0 03/12/2022   RBC 4.14 03/12/2022   HGB 11.1 (L) 03/12/2022   HCT 33.6 (L) 03/12/2022   MCV 81.2 03/12/2022   MCH 26.8 03/12/2022   PLT 232 03/12/2022   MCHC 33.0 03/12/2022   RDW 15.0 03/12/2022   LYMPHSABS 1.9 03/12/2022   MONOABS 0.5 03/12/2022   EOSABS 0.2 03/12/2022   BASOSABS 0.0 [id number]     Last metabolic panel Lab Results  Component Value Date   NA 133 (L) 03/12/2022   K 3.7 03/12/2022   CL 101 03/12/2022   CO2 22 03/12/2022   BUN 9 03/12/2022   CREATININE 0.74 03/12/2022   GLUCOSE 191 (H) 03/12/2022   GFRNONAA >60 03/12/2022   CALCIUM 9.2 03/12/2022   PHOS 4.0 02/25/2022   PROT 6.9 02/25/2022   ALBUMIN 2.7 (L) 02/25/2022   BILITOT 0.4 02/25/2022   ALKPHOS 84 02/25/2022   AST 47 (H) 02/25/2022   ALT 144 (H) 02/25/2022   ANIONGAP 10 03/12/2022    CBG (last 3)  Recent Labs    03/15/22 2312 03/16/22 0311 03/16/22 0742  GLUCAP 103* 106* 90       Coagulation Profile: No results for input(s): INR, PROTIME in the last 168 hours.   Radiology Studies: No results found.     Hosie Poisson M.D. [facility] 03/16/2022, 10:27 AM  Available via Epic secure chat 7am-7pm After 7 pm, please refer to night coverage provider listed on amion.

## 2022-03-16 NOTE — Plan of Care (Signed)

## 2022-03-17 LAB — URINALYSIS, ROUTINE W REFLEX MICROSCOPIC
Bilirubin Urine: NEGATIVE
Glucose, UA: NEGATIVE mg/dL
Hgb urine dipstick: NEGATIVE
Ketones, ur: NEGATIVE mg/dL
Nitrite: NEGATIVE
Protein, ur: NEGATIVE mg/dL
Specific Gravity, Urine: 1.01 (ref 1.005–1.030)
WBC, UA: >50 WBC/hpf — ABNORMAL HIGH (ref 0–5)
pH: 8 (ref 5.0–8.0)

## 2022-03-17 LAB — GLUCOSE, CAPILLARY
Glucose-Capillary: 94 mg/dL (ref 70–99)
Glucose-Capillary: 106 mg/dL — ABNORMAL HIGH (ref 70–99)
Glucose-Capillary: 113 mg/dL — ABNORMAL HIGH (ref 70–99)
Glucose-Capillary: 114 mg/dL — ABNORMAL HIGH (ref 70–99)
Glucose-Capillary: 109 mg/dL — ABNORMAL HIGH (ref 70–99)
Glucose-Capillary: 112 mg/dL — ABNORMAL HIGH (ref 70–99)

## 2022-03-17 NOTE — Progress Notes (Signed)
[facility]                                                                               Cindy Brooks, is a 51 y.o. female, DOB - Mar 11, 1971, [id number] Admit date - 01/20/2022    Outpatient Primary MD for the patient is Pcp, No  LOS - 55  days    Brief summary   Patient is a 51 year old female with history of uncontrolled hypertension, history of TIA who presented to the [facility] emergency department on 4/9 after being found unresponsive by his family.  Patient was found to be obtunded with copious secretions, subsequently intubated on 4/9 for airway protection.  Hospital course remarkable for uncontrolled hypertension, work-up also showed thalamic stroke.  Wernicke's encephalopathy also suspected.  Transferred to [facility] service.    PCCM was consulted,she has been de-cannulated on 5/19. plan for discharge next week home with home health.   4/9 Admit  4/12 ongoing high dose thiamine for possible Wernike's encephalopathy  4/13 IV sedation stopped, phenobarbital wean 4/17 still not waking up. Trach placed.  4/18 PEG placed. Spiking fever. Lactate spiked. Got fluid. Cleared.  4/22 Tolerated 6hrs of ATC day prior. No major events overnight, appears that stroke had [place] meeting with family 4/21 with no decision made 4/23 No issues overnight, on vent this am, remains unresponsive  4/27 changed to propranolol from metoprolol for neuro-storming. Started on vanc and cefepime for possible HCAP  4/28 trial on open ended trach collar 5/2 patient transferred to Hospitalist Service.  5/14 waking up and following simple commands. 5/15 downsized tracheostomy to #4 5/16 tracheostomy capping trials. 5/19 Decannulation 5/25 Foley placement for urinary retention  Foley catheter discontinued yesterday.    Assessment & Plan    Assessment and Plan:    Acute metabolic encephalopathy:  - Multifactorial: Thalamic stroke, Warnicke's encephalopathy.  -MRI brain on 4/11 showed patchy  restricted diffusion on both thalami and midbrain. 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  S/p high dose thiamine IV, followed by oral thiamine.  -Patient was also on phenobarbital which was weaned off. -Status post trach, PEG placement.   -Remains on amantadine, gabapentin. - she is alert and following simple commands, talking a little.    Acute hypoxic respiratory failure:  -Intubated on presentation for airway protection.  Prolonged ventilator use/tracheostomy.  Hospital course also remarkable for healthcare associated pneumonia, treated with antibiotics, completed the course.  repeat CXR does not show any acute cardiopulm disease.  -decannulated on 5/19 PCCM signed off now on RA   Thalamic stroke: continue with plavix and statin.    Phospholipid antibody with hypercoagulable state:  -Positive anticardiolipin IgM, lupus anticoagulant: - -Continue Plavix.  Start Lovenox for prophylaxis on 5/28.   Dysphagia: Status post PEG on 4/15. -Speech therapy closely following, recommending dysphagia 2 diet with thin liquid Nocturnal feeds discontinued on 03/15/22 To encourage oral feeding.    Hypertension: BP parameters  are optimal.    History of hyperlipidemia: On Crestor   Obesity: BMI  of 30.3 Outpatient follow up with Pcp.    Acute urinary retention: Foley placed 5/25.  Started her on flomax , voiding trial yesterday. One episode of urinary retention s/p I&D.  Check UA today and if she continues to have Urinary retention, plan to place foley catheter.    Left Knee Pain: noted 5/30, L knee swelling and tenderness, pt c/o severe pain.   x ray showed severe degenerative changes. Topical voltaren and ice packs PRN.  Continue PT as tolerated.      Malnutrition Type:  Nutrition Problem: Inadequate  oral intake Etiology: inability to eat   Malnutrition Characteristics:  Signs/Symptoms: NPO status   Nutrition Interventions:  Interventions: Ensure Enlive (each supplement provides 350kcal and 20 grams of protein), MVI  Estimated body mass index is 29.21 kg/m as calculated from the following:   Height as of this encounter: 5\' 4"  (1.626 m).   Weight as of this encounter: 77.2 kg.  Code Status: full code.  DVT Prophylaxis:  enoxaparin (LOVENOX) injection 40 mg Start: 03/10/22 1045 SCDs Start: 01/20/22 1012   Level of Care: Level of care: med surg.  Family Communication: none at bedside.   Disposition Plan:     Remains inpatient appropriate:plan for discharge home with home health next week, when DME is delivered.    Antimicrobials:   Anti-infectives (From admission, onward)    Start     Dose/Rate Route Frequency Ordered Stop   02/08/22 1800  vancomycin (VANCOREADY) IVPB 1500 mg/300 mL  Status:  Discontinued        1,500 mg 150 mL/hr over 120 Minutes Intravenous Every 24 hours 02/07/22 1712 02/09/22 1046   02/07/22 1800  vancomycin (VANCOREADY) IVPB 1750 mg/350 mL        1,750 mg 175 mL/hr over 120 Minutes Intravenous  Once 02/07/22 1708 02/07/22 2046   02/07/22 1700  ceFEPIme (MAXIPIME) 2 g in sodium chloride 0.9 % 100 mL IVPB        2 g 200 mL/hr over 30 Minutes Intravenous Every 8 hours 02/07/22 1706 02/14/22 0856   01/20/22 2000  cefTRIAXone (ROCEPHIN) 2 g in sodium chloride 0.9 % 100 mL IVPB        2 g 200 mL/hr over 30 Minutes Intravenous Every 24 hours 01/20/22 0634 01/24/22 2022   01/20/22 0415  cefTRIAXone (ROCEPHIN) 2 g in sodium chloride 0.9 % 100 mL IVPB        2 g 200 mL/hr over 30 Minutes Intravenous  Once 01/20/22 0414 01/20/22 0537        Medications  Scheduled Meds:  amantadine  100 mg Per Tube BID   amLODipine  10 mg Per Tube Daily   Chlorhexidine Gluconate Cloth  6 each Topical Daily   clopidogrel  75 mg Per Tube Daily   diclofenac Sodium   4 g Topical QID   docusate  100 mg Per Tube BID   enoxaparin (LOVENOX) injection  40 mg Subcutaneous Q24H   feeding supplement  237 mL Oral TID BM   folic acid  1 mg Per Tube Daily   free water  50 mL Per Tube Q6H   gabapentin  100 mg Per Tube Q12H   glycopyrrolate  1 mg Per Tube BID   guaiFENesin  10 mL Per Tube Q6H   hydrALAZINE  50 mg Per Tube Q8H   insulin aspart  0-15 Units Subcutaneous Q4H   multivitamin with minerals  1 tablet Per Tube Daily   propranolol  40 mg Per Tube  TID   rosuvastatin  40 mg Per Tube Daily   senna  1 tablet Per Tube Daily   tamsulosin  0.4 mg Oral QPC supper   thiamine  100 mg Per Tube Daily   Continuous Infusions:  sodium chloride Stopped (02/11/22 0928)   PRN Meds:.sodium chloride, acetaminophen (TYLENOL) oral liquid 160 mg/5 mL, Gerhardt's butt cream, ipratropium-albuterol, labetalol, metoprolol tartrate, polyethylene glycol, promethazine    Subjective:   Cindy Brooks was seen and examined today. Urinary retention earlier this am.   Objective:   Vitals:   03/17/22 0320 03/17/22 0346 03/17/22 0843 03/17/22 1532  BP: 140/84  (!) 155/88 (!) 147/88  Pulse: 87  95 89  Resp: 16     Temp: 97.7 F (36.5 C)  98.5 F (36.9 C) 97.9 F (36.6 C)  TempSrc: Axillary  Axillary Oral  SpO2: 96%  97% 98%  Weight:  77.2 kg    Height:        Intake/Output Summary (Last 24 hours) at 03/17/2022 1645 Last data filed at 03/17/2022 1030 Gross per 24 hour  Intake 237 ml  Output 1078 ml  Net -841 ml    Filed Weights   03/15/22 0500 03/16/22 0318 03/17/22 0346  Weight: 76.8 kg 78.9 kg 77.2 kg     Exam General exam: Appears calm and comfortable  Respiratory system: Clear to auscultation. Respiratory effort normal. Cardiovascular system: S1 & S2 heard, RRR. No JVD,  No pedal edema. Gastrointestinal system: Abdomen is nondistended, soft and nontender. No organomegaly or masses felt. Normal bowel sounds heard. S/p PEG in place.  Central nervous system:  Alert and oriented to self. Answering some questions.  Extremities: Symmetric 5 x 5 power. Skin: No rashes, lesions or ulcers Psychiatry: mood is appropriate.       Data Reviewed:  I have personally reviewed following labs and imaging studies   CBC Lab Results  Component Value Date   WBC 6.0 03/16/2022   RBC 4.11 03/16/2022   HGB 11.2 (L) 03/16/2022   HCT 33.7 (L) 03/16/2022   MCV 82.0 03/16/2022   MCH 27.3 03/16/2022   PLT 291 03/16/2022   MCHC 33.2 03/16/2022   RDW 15.7 (H) 03/16/2022   LYMPHSABS 2.1 03/16/2022   MONOABS 0.7 03/16/2022   EOSABS 0.1 03/16/2022   BASOSABS 0.0 [id number]     Last metabolic panel Lab Results  Component Value Date   NA 137 03/16/2022   K 4.1 03/16/2022   CL 104 03/16/2022   CO2 22 03/16/2022   BUN 8 03/16/2022   CREATININE 0.69 03/16/2022   GLUCOSE 98 03/16/2022   GFRNONAA >60 03/16/2022   CALCIUM 9.7 03/16/2022   PHOS 4.0 02/25/2022   PROT 7.5 03/16/2022   ALBUMIN 3.5 03/16/2022   BILITOT 0.7 03/16/2022   ALKPHOS 107 03/16/2022   AST 70 (H) 03/16/2022   ALT 184 (H) 03/16/2022   ANIONGAP 11 03/16/2022    CBG (last 3)  Recent Labs    03/17/22 0325 03/17/22 0839 03/17/22 1122  GLUCAP 94 106* 113*       Coagulation Profile: No results for input(s): INR, PROTIME in the last 168 hours.   Radiology Studies: No results found.     Hosie Poisson M.D. [facility] 03/17/2022, 4:45 PM  Available via Epic secure chat 7am-7pm After 7 pm, please refer to night coverage provider listed on amion.

## 2022-03-17 NOTE — Progress Notes (Signed)
1030- bladder scan revealed 630 ml.  I/O cath returned 800 ml of clear yellow urine.   MD notified of urinary retention plan to I/O one more time if necessary and if retention continues after that will place foley.

## 2022-03-17 NOTE — Progress Notes (Signed)
Per verbal order foley cath replaced d/t continued urinary rentention with return of clear yellow urine. Pt tolerated well.

## 2022-03-18 LAB — GLUCOSE, CAPILLARY
Glucose-Capillary: 93 mg/dL (ref 70–99)
Glucose-Capillary: 108 mg/dL — ABNORMAL HIGH (ref 70–99)
Glucose-Capillary: 131 mg/dL — ABNORMAL HIGH (ref 70–99)
Glucose-Capillary: 153 mg/dL — ABNORMAL HIGH (ref 70–99)
Glucose-Capillary: 127 mg/dL — ABNORMAL HIGH (ref 70–99)
Glucose-Capillary: 114 mg/dL — ABNORMAL HIGH (ref 70–99)

## 2022-03-18 MED ORDER — ACETAMINOPHEN 160 MG/5ML PO SOLN
650.0000 mg | Freq: Four times a day (QID) | ORAL | Status: DC | PRN
  Administered 2022-03-19: 650 mg via ORAL
  Filled 2022-03-18: qty 20.3

## 2022-03-18 MED ORDER — FOLIC ACID 1 MG PO TABS
1.0000 mg | ORAL_TABLET | Freq: Every day | ORAL | Status: DC
  Administered 2022-03-18 – 2022-03-21 (×4): 1 mg via ORAL
  Filled 2022-03-18 (×4): qty 1

## 2022-03-18 MED ORDER — POLYETHYLENE GLYCOL 3350 17 G PO PACK: 17.0000 g | PACK | Freq: Every day | ORAL | Status: DC | PRN

## 2022-03-18 MED ORDER — DOCUSATE SODIUM 50 MG/5ML PO LIQD
100.0000 mg | Freq: Two times a day (BID) | ORAL | Status: DC
  Administered 2022-03-18 – 2022-03-20 (×6): 100 mg via ORAL
  Filled 2022-03-18 (×6): qty 10

## 2022-03-18 MED ORDER — GUAIFENESIN 200 MG PO TABS
100.0000 mg | ORAL_TABLET | Freq: Three times a day (TID) | ORAL | Status: DC
  Administered 2022-03-18 – 2022-03-21 (×13): 100 mg via ORAL
  Filled 2022-03-18 (×12): qty 1

## 2022-03-18 MED ORDER — THIAMINE HCL 100 MG PO TABS
100.0000 mg | ORAL_TABLET | Freq: Every day | ORAL | Status: DC
  Administered 2022-03-18 – 2022-03-21 (×4): 100 mg via ORAL
  Filled 2022-03-18 (×4): qty 1

## 2022-03-18 MED ORDER — ADULT MULTIVITAMIN W/MINERALS CH
1.0000 | ORAL_TABLET | Freq: Every day | ORAL | Status: DC
  Administered 2022-03-18 – 2022-03-21 (×4): 1 via ORAL
  Filled 2022-03-18 (×4): qty 1

## 2022-03-18 MED ORDER — PROPRANOLOL HCL 40 MG PO TABS
40.0000 mg | ORAL_TABLET | Freq: Three times a day (TID) | ORAL | Status: DC
  Administered 2022-03-18 – 2022-03-21 (×9): 40 mg via ORAL
  Filled 2022-03-18 (×12): qty 1

## 2022-03-18 MED ORDER — ROSUVASTATIN CALCIUM 20 MG PO TABS
40.0000 mg | ORAL_TABLET | Freq: Every day | ORAL | Status: DC
  Administered 2022-03-18 – 2022-03-21 (×4): 40 mg via ORAL
  Filled 2022-03-18 (×4): qty 2

## 2022-03-18 MED ORDER — CLOPIDOGREL BISULFATE 75 MG PO TABS
75.0000 mg | ORAL_TABLET | Freq: Every day | ORAL | Status: DC
  Administered 2022-03-18 – 2022-03-21 (×4): 75 mg via ORAL
  Filled 2022-03-18 (×4): qty 1

## 2022-03-18 MED ORDER — SENNA 8.6 MG PO TABS
1.0000 | ORAL_TABLET | Freq: Every day | ORAL | Status: DC
  Administered 2022-03-18 – 2022-03-21 (×4): 8.6 mg via ORAL
  Filled 2022-03-18 (×4): qty 1

## 2022-03-18 MED ORDER — HYDRALAZINE HCL 50 MG PO TABS
50.0000 mg | ORAL_TABLET | Freq: Three times a day (TID) | ORAL | Status: DC
  Administered 2022-03-18 – 2022-03-21 (×9): 50 mg via ORAL
  Filled 2022-03-18 (×10): qty 1

## 2022-03-18 MED ORDER — PROMETHAZINE HCL 6.25 MG/5ML PO SYRP
12.5000 mg | ORAL_SOLUTION | Freq: Four times a day (QID) | ORAL | Status: DC | PRN
Start: 2022-03-18 — End: 2022-03-21

## 2022-03-18 MED ORDER — AMLODIPINE BESYLATE 10 MG PO TABS
10.0000 mg | ORAL_TABLET | Freq: Every day | ORAL | Status: DC
  Administered 2022-03-18 – 2022-03-21 (×3): 10 mg via ORAL
  Filled 2022-03-18 (×4): qty 1

## 2022-03-18 MED ORDER — GLYCOPYRROLATE 1 MG PO TABS
1.0000 mg | ORAL_TABLET | Freq: Two times a day (BID) | ORAL | Status: DC
  Administered 2022-03-18 – 2022-03-21 (×7): 1 mg via ORAL
  Filled 2022-03-18 (×8): qty 1

## 2022-03-18 MED ORDER — GABAPENTIN 100 MG PO CAPS
100.0000 mg | ORAL_CAPSULE | Freq: Two times a day (BID) | ORAL | Status: DC
  Administered 2022-03-18 – 2022-03-21 (×7): 100 mg via ORAL
  Filled 2022-03-18 (×7): qty 1

## 2022-03-18 MED ORDER — AMANTADINE HCL 50 MG/5ML PO SOLN
100.0000 mg | Freq: Two times a day (BID) | ORAL | Status: DC
  Administered 2022-03-18 – 2022-03-21 (×7): 100 mg via ORAL
  Filled 2022-03-18 (×8): qty 10

## 2022-03-18 NOTE — Progress Notes (Signed)
[facility]                                                                               Cindy Brooks, is a 51 y.o. female, DOB - 10-Dec-1970, [id number] Admit date - 01/20/2022    Outpatient Primary MD for the patient is Pcp, No  LOS - 57  days    Brief summary   Patient is a 51 year old female with history of uncontrolled hypertension, history of TIA who presented to the [facility] emergency department on 4/9 after being found unresponsive by his family.  Patient was found to be obtunded with copious secretions, subsequently intubated on 4/9 for airway protection.  Hospital course remarkable for uncontrolled hypertension, work-up also showed thalamic stroke.  Wernicke's encephalopathy also suspected.  Transferred to [facility] service.    PCCM was consulted,she has been de-cannulated on 5/19. plan for discharge next week home with home health.   4/9 Admit  4/12 ongoing high dose thiamine for possible Wernike's encephalopathy  4/13 IV sedation stopped, phenobarbital wean 4/17 still not waking up. Trach placed.  4/18 PEG placed. Spiking fever. Lactate spiked. Got fluid. Cleared.  4/22 Tolerated 6hrs of ATC day prior. No major events overnight, appears that stroke had GOC meeting with family 4/21 with no decision made 4/23 No issues overnight, on vent this am, remains unresponsive  4/27 changed to propranolol from metoprolol for neuro-storming. Started on vanc and cefepime for possible HCAP  4/28 trial on open ended trach collar 5/2 patient transferred to Hospitalist Service.  5/14 waking up and following simple commands. 5/15 downsized tracheostomy to #4 5/16 tracheostomy capping trials. 5/19 Decannulation 5/25 Foley placement for urinary retention 6/3 failed voiding trial and Replaced foley.    Assessment & Plan    Assessment and Plan:    Acute metabolic encephalopathy:  - Multifactorial: Thalamic stroke, Warnicke's encephalopathy.  -MRI brain on 4/11 showed  patchy restricted diffusion on both thalami and midbrain. 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  S/p high dose thiamine IV, followed by oral thiamine.  -Patient was also on phenobarbital which was weaned off. -Status post trach, PEG placement.   -Remains on amantadine, gabapentin. - she is alert and saying a few words.    Acute hypoxic respiratory failure:  -Intubated on presentation for airway protection.  Prolonged ventilator use/tracheostomy.  Hospital course also remarkable for healthcare associated pneumonia, treated with antibiotics, completed the course.  repeat CXR does not show any acute cardiopulm disease.  -decannulated on 5/19 PCCM signed off now on RA   Thalamic stroke: continue with plavix and statin.    Phospholipid antibody with hypercoagulable state:  -Positive anticardiolipin IgM, lupus anticoagulant: - -Continue Plavix.  Started Lovenox for prophylaxis on 5/28.   Dysphagia: Status post PEG on 4/15. -Speech therapy closely following, recommending dysphagia 2 diet with thin liquid Nocturnal feeds discontinued on 03/15/22 To encourage oral feeding.    Hypertension: BP parameters are well controlled. Patient on Norvasc 10 mg daily and hydralazine 50 mg tid.  History of hyperlipidemia: On Crestor   Obesity: BMI of 30.3 Outpatient follow up with Pcp.    Acute urinary retention: Foley placed 5/25.  Started her on flomax ,failed voiding trial. Replaced foley .  Recommend outpatient follow up with urology as outpatient and plan for voiding trial in the urology office.    Left Knee Pain: noted 5/30, L knee swelling and tenderness, pt c/o severe pain.   x ray showed severe degenerative changes. Topical voltaren and ice packs PRN.  Continue PT as tolerated.      Malnutrition  Type:  Nutrition Problem: Inadequate oral intake Etiology: inability to eat   Malnutrition Characteristics:  Signs/Symptoms: NPO status   Nutrition Interventions:  Interventions: Ensure Enlive (each supplement provides 350kcal and 20 grams of protein), MVI  Estimated body mass index is 28.15 kg/m as calculated from the following:   Height as of this encounter: 5\' 4"  (1.626 m).   Weight as of this encounter: 74.4 kg.  Code Status: full code.  DVT Prophylaxis:  enoxaparin (LOVENOX) injection 40 mg Start: 03/10/22 1045 SCDs Start: 01/20/22 1012   Level of Care: Level of care: med surg.  Family Communication: none at bedside.   Disposition Plan:     Remains inpatient appropriate:plan for discharge home with home health next week, when DME is delivered.    Antimicrobials:   Anti-infectives (From admission, onward)    Start     Dose/Rate Route Frequency Ordered Stop   02/08/22 1800  vancomycin (VANCOREADY) IVPB 1500 mg/300 mL  Status:  Discontinued        1,500 mg 150 mL/hr over 120 Minutes Intravenous Every 24 hours 02/07/22 1712 02/09/22 1046   02/07/22 1800  vancomycin (VANCOREADY) IVPB 1750 mg/350 mL        1,750 mg 175 mL/hr over 120 Minutes Intravenous  Once 02/07/22 1708 02/07/22 2046   02/07/22 1700  ceFEPIme (MAXIPIME) 2 g in sodium chloride 0.9 % 100 mL IVPB        2 g 200 mL/hr over 30 Minutes Intravenous Every 8 hours 02/07/22 1706 02/14/22 0856   01/20/22 2000  cefTRIAXone (ROCEPHIN) 2 g in sodium chloride 0.9 % 100 mL IVPB        2 g 200 mL/hr over 30 Minutes Intravenous Every 24 hours 01/20/22 0634 01/24/22 2022   01/20/22 0415  cefTRIAXone (ROCEPHIN) 2 g in sodium chloride 0.9 % 100 mL IVPB        2 g 200 mL/hr over 30 Minutes Intravenous  Once 01/20/22 0414 01/20/22 0537        Medications  Scheduled Meds:  amantadine  100 mg Oral BID   amLODipine  10 mg Oral Daily   Chlorhexidine Gluconate Cloth  6 each Topical Daily   clopidogrel  75 mg Oral  Daily   diclofenac Sodium  4 g Topical QID   docusate  100 mg Oral BID   enoxaparin (LOVENOX) injection  40 mg Subcutaneous Q24H   feeding supplement  237 mL Oral TID BM   folic acid  1 mg Oral Daily   free water  50 mL Per Tube Q6H   gabapentin  100 mg Oral BID   glycopyrrolate  1 mg Oral BID   guaiFENesin  100 mg Oral TID AC & HS   hydrALAZINE  50 mg Oral Q8H   insulin aspart  0-15 Units Subcutaneous Q4H   multivitamin with minerals  1 tablet Oral Daily   propranolol  40 mg Oral TID  rosuvastatin  40 mg Oral Daily   senna  1 tablet Oral Daily   tamsulosin  0.4 mg Oral QPC supper   thiamine  100 mg Oral Daily   Continuous Infusions:  sodium chloride Stopped (02/11/22 0928)   PRN Meds:.sodium chloride, acetaminophen (TYLENOL) oral liquid 160 mg/5 mL, Gerhardt's butt cream, ipratropium-albuterol, labetalol, metoprolol tartrate, polyethylene glycol, promethazine    Subjective:   Cindy Brooks was seen and examined today.  No events  Objective:   Vitals:   03/18/22 0326 03/18/22 0432 03/18/22 0533 03/18/22 0900  BP: 138/88  129/87 (!) 153/88  Pulse: 78  81 92  Resp: 16     Temp: 98.4 F (36.9 C)     TempSrc: Oral     SpO2: 96%  97%   Weight:  74.4 kg    Height:        Intake/Output Summary (Last 24 hours) at 03/18/2022 0943 Last data filed at 03/18/2022 [id number] Gross per 24 hour  Intake 450 ml  Output 2053 ml  Net -1603 ml    Filed Weights   03/16/22 0318 03/17/22 0346 03/18/22 0432  Weight: 78.9 kg 77.2 kg 74.4 kg     Exam General exam: Appears calm and comfortable  Respiratory system: Clear to auscultation. Respiratory effort normal. Cardiovascular system: S1 & S2 heard, RRR. No JVD, No pedal edema. Gastrointestinal system: Abdomen is nondistended, soft and nontender.  S/p PEG in place. Normal bowel sounds heard. Central nervous system: Alert and oriented t self only. Able to move all extremities.  Extremities: Symmetric 5 x 5 power. Skin: No rashes, lesions  or ulcers Psychiatry:  Mood & affect appropriate.        Data Reviewed:  I have personally reviewed following labs and imaging studies   CBC Lab Results  Component Value Date   WBC 6.0 03/16/2022   RBC 4.11 03/16/2022   HGB 11.2 (L) 03/16/2022   HCT 33.7 (L) 03/16/2022   MCV 82.0 03/16/2022   MCH 27.3 03/16/2022   PLT 291 03/16/2022   MCHC 33.2 03/16/2022   RDW 15.7 (H) 03/16/2022   LYMPHSABS 2.1 03/16/2022   MONOABS 0.7 03/16/2022   EOSABS 0.1 03/16/2022   BASOSABS 0.0 [id number]     Last metabolic panel Lab Results  Component Value Date   NA 137 03/16/2022   K 4.1 03/16/2022   CL 104 03/16/2022   CO2 22 03/16/2022   BUN 8 03/16/2022   CREATININE 0.69 03/16/2022   GLUCOSE 98 03/16/2022   GFRNONAA >60 03/16/2022   CALCIUM 9.7 03/16/2022   PHOS 4.0 02/25/2022   PROT 7.5 03/16/2022   ALBUMIN 3.5 03/16/2022   BILITOT 0.7 03/16/2022   ALKPHOS 107 03/16/2022   AST 70 (H) 03/16/2022   ALT 184 (H) 03/16/2022   ANIONGAP 11 03/16/2022    CBG (last 3)  Recent Labs    03/17/22 2327 03/18/22 0329 03/18/22 0730  GLUCAP 112* 93 108*       Coagulation Profile: No results for input(s): INR, PROTIME in the last 168 hours.   Radiology Studies: No results found.     Hosie Poisson M.D. [facility] 03/18/2022, 9:43 AM  Available via Epic secure chat 7am-7pm After 7 pm, please refer to night coverage provider listed on amion.

## 2022-03-18 NOTE — Progress Notes (Signed)
Occupational Therapy Treatment Patient Details Name: Cindy Brooks MRN: [id number] DOB: [birth year] Today's Date: 03/18/2022   History of present illness Pt is a 51 y.o. female with history of HTN, TIA, ETOH abuse and medication noncompliance presenting to the ED after she was found by her family to be unresponsive in her car on 4/8. 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 Intubated 4/9. Tracheostormy 4/16.  Trach capped 5/16.   OT comments  Cindy Brooks is progressing incrementally, acute goals updated - see care plan. Upon arrival, pt was lethargic and required increased time and cues to participate in grooming and bed mobility to eventually increase LOA. While sitting EOB pt participated in applying lotion to BLEs with BUEs and min A. She also stood with steady frame and mod A +2. Pt only follow about 25% of verbal cues and only made one verbalization this session. She continues to benefit from OT acutely. D/c updated to SNF however pt likely to d/c home with family.    Recommendations for follow up therapy are one component of a multi-disciplinary discharge planning process, led by the attending physician.  Recommendations may be updated based on patient status, additional functional criteria and insurance authorization.    Follow Up Recommendations  Skilled nursing-short term rehab (<3 hours/day) (However, pt likely to d/c home with family assist. Will need HHOT and max HH services)    Assistance Recommended at Discharge Frequent or constant Supervision/Assistance  Patient can return home with the following  Two people to help with walking and/or transfers;Two people to help with bathing/dressing/bathroom;Assistance with cooking/housework;Assistance with feeding;Direct supervision/assist for medications management;Direct  supervision/assist for financial management;Assist for transportation;Help with stairs or ramp for entrance   Equipment Recommendations  BSC/3in1;Wheelchair (measurements OT);Wheelchair cushion (measurements OT);Tub/shower bench       Precautions / Restrictions Precautions Precautions: Fall Precaution Comments: PEG, stoma Restrictions Weight Bearing Restrictions: No       Mobility Bed Mobility Overal bed mobility: Needs Assistance Bed Mobility: Rolling, Sidelying to Sit Rolling: Mod assist, +2 for physical assistance, +2 for safety/equipment Sidelying to sit: Mod assist, +2 for physical assistance, +2 for safety/equipment            Transfers Overall transfer level: Needs assistance Equipment used: Ambulation equipment used Transfers: Sit to/from Stand Sit to Stand: Mod assist, +2 physical assistance, +2 safety/equipment             Transfer via Lift Equipment: Stedy   Balance Overall balance assessment: Needs assistance Sitting-balance support: Feet supported, No upper extremity supported Sitting balance-Leahy Scale: Fair     Standing balance support: Bilateral upper extremity supported Standing balance-Leahy Scale: Poor           ADL either performed or assessed with clinical judgement   ADL Overall ADL's : Needs assistance/impaired     Grooming: Wash/dry face;Sitting;Minimal assistance Grooming Details (indicate cue type and reason): washed face while supported upright in bed, min A to initiate. applied lotion to knees and distal LEs with min A to initiate                 Toilet Transfer: Moderate assistance;+2 for physical assistance;+2 for safety/equipment Toilet Transfer Details (indicate cue type and reason): with steady. simulated.         Functional mobility during ADLs: Moderate assistance;+2 for physical assistance;+2 for safety/equipment General ADL Comments: increased time and cues required for  all    Extremity/Trunk Assessment  Upper Extremity Assessment Upper Extremity Assessment: LUE deficits/detail RUE Deficits / Details: Using RUE to apply lotion and wash face. Does not reach over head but can reach to face and flex shoulder to ~90% with increased time and effort LUE Deficits / Details: using to rub lotion on LLE. no over head ROM noted. able to reach forward to assist in standing   Lower Extremity Assessment Lower Extremity Assessment: Defer to PT evaluation        Vision   Vision Assessment?: Vision impaired- to be further tested in functional context Additional Comments: L eye closed 100% of the session   Perception Perception Perception: Not tested   Praxis Praxis Praxis: Not tested    Cognition Arousal/Alertness: Lethargic Behavior During Therapy: Flat affect Overall Cognitive Status: Difficult to assess           General Comments: Pt made limited verbalizations throughout session, only stating "morning" one time. She did follo wabotu 25% of simple verbal cues. Extremely lethargic, but once transferred EOB LOA improved.              General Comments VSS on RA    Pertinent Vitals/ Pain       Pain Assessment Pain Assessment: Faces Faces Pain Scale: No hurt Pain Intervention(s): Monitored during session         Frequency  Min 5X/week        Progress Toward Goals  OT Goals(current goals can now be found in the care plan section)  Progress towards OT goals: Progressing toward goals  Acute Rehab OT Goals Patient Stated Goal: did not state OT Goal Formulation: Patient unable to participate in goal setting Time For Goal Achievement: 03/31/22 Potential to Achieve Goals: Good ADL Goals Pt Will Perform Grooming: sitting;with min guard assist Pt Will Perform Upper Body Bathing: with min assist;sitting Pt Will Transfer to Toilet: with mod assist;stand pivot transfer;bedside commode Additional ADL Goal #1: Pt will complete bed mobility with min A as a precursor to  ADLs Additional ADL Goal #2: Pt will follow simple one step commands 100% of the session to assist in ADLs  Plan Discharge plan needs to be updated    Co-evaluation    PT/OT/SLP Co-Evaluation/Treatment: Yes Reason for Co-Treatment: Complexity of the patient's impairments (multi-system involvement);For patient/therapist safety;To address functional/ADL transfers   OT goals addressed during session: ADL's and self-care      AM-PAC OT "6 Clicks" Daily Activity     Outcome Measure   Help from another person eating meals?: A Little Help from another person taking care of personal grooming?: A Little Help from another person toileting, which includes using toliet, bedpan, or urinal?: Total Help from another person bathing (including washing, rinsing, drying)?: A Lot Help from another person to put on and taking off regular upper body clothing?: A Lot Help from another person to put on and taking off regular lower body clothing?: A Lot 6 Click Score: 13    End of Session Equipment Utilized During Treatment: Gait belt (steady)  OT Visit Diagnosis: Unsteadiness on feet (R26.81);Other abnormalities of gait and mobility (R26.89);Muscle weakness (generalized) (M62.81);Pain   Activity Tolerance Patient tolerated treatment well   Patient Left Other (comment) (standing in steady with PT)   Nurse Communication Mobility status        Time: 1020-1041 OT Time Calculation (min): 21 min  Charges: OT General Charges $OT Visit: 1 Visit    Cindy Brooks A Lucifer Soja 03/18/2022, 11:43 AM

## 2022-03-18 NOTE — Progress Notes (Signed)
Occupational Therapy Treatment Patient Details Name: Cindy Brooks MRN: [id number] DOB: 01-Nov-1970 Today's Date: 03/18/2022   History of present illness Pt is a 51 y.o. female with history of HTN, TIA, ETOH abuse and medication noncompliance presenting to the ED after she was found by her family to be unresponsive in her car on 4/8. 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 Intubated 4/9. Tracheostormy 4/16.  Trach capped 5/16.   OT comments  [facility] OT/PT session: Patient more lethargic on this date and required more frequent cues and increased time to follow directions. Patient performed self feeding seated in recliner with built up utensil with occasional min assist to load utensil. Patient became more alert and performed 3 stands and took steps with RW with mod assist x2. Patient is possible discharge to home this week with daughter and will continue to be followed with [facility] program to address self care and transfers to decrease burden of care and increase safety with transfer.    Recommendations for follow up therapy are one component of a multi-disciplinary discharge planning process, led by the attending physician.  Recommendations may be updated based on patient status, additional functional criteria and insurance authorization.    Follow Up Recommendations  Skilled nursing-short term rehab (<3 hours/day) (possible discharge to daughters this week)    Assistance Recommended at Discharge Frequent or constant Supervision/Assistance  Patient can return home with the following  Two people to help with walking and/or transfers;Two people to help with bathing/dressing/bathroom;Assistance with cooking/housework;Assistance with feeding;Direct supervision/assist for medications management;Direct supervision/assist for  financial management;Assist for transportation;Help with stairs or ramp for entrance   Equipment Recommendations  BSC/3in1;Wheelchair (measurements OT);Wheelchair cushion (measurements OT);Tub/shower bench    Recommendations for Other Services      Precautions / Restrictions Precautions Precautions: Fall Precaution Comments: PEG, stoma Restrictions Weight Bearing Restrictions: No       Mobility Bed Mobility Overal bed mobility: Needs Assistance Bed Mobility: Rolling, Sidelying to Sit Rolling: Mod assist, +2 for physical assistance, +2 for safety/equipment Sidelying to sit: Mod assist, +2 for physical assistance, +2 for safety/equipment       General bed mobility comments: limited patient participation on this date.    Transfers Overall transfer level: Needs assistance Equipment used: Ambulation equipment used, Rolling walker (2 wheels) Transfers: Sit to/from Stand Sit to Stand: Mod assist, +2 physical assistance, +2 safety/equipment           General transfer comment: transfer to recliner with Stedy. Patient stood x3 from recliner with mod assist of 2 and took steps with verbal cues and mod assist of 2 Transfer via Lift Equipment: Stedy   Balance Overall balance assessment: Needs assistance Sitting-balance support: Feet supported, No upper extremity supported Sitting balance-Leahy Scale: Fair     Standing balance support: Bilateral upper extremity supported Standing balance-Leahy Scale: Poor Standing balance comment: reliant on UE support. cues for posture                           ADL either performed or assessed with clinical judgement   ADL Overall ADL's : Needs assistance/impaired Eating/Feeding: Minimal assistance;Sitting;With adaptive utensils Eating/Feeding Details (indicate cue type and reason): able to hold utensil with built up handle.  Able to manage coffee cup. Occasional min assist to load spoon Grooming: Wash/dry face;Sitting;Minimal  assistance Grooming Details (indicate cue type  and reason): washed face while supported upright in bed, min A to initiate. applied lotion to knees and distal LEs with min A to initiate                 Toilet Transfer: Moderate assistance;+2 for physical assistance;+2 for safety/equipment Toilet Transfer Details (indicate cue type and reason): with steady. simulated.         Functional mobility during ADLs: Moderate assistance;+2 for physical assistance;+2 for safety/equipment General ADL Comments: more lethargic on this date requiring increased time to follow commands.    Extremity/Trunk Assessment Upper Extremity Assessment Upper Extremity Assessment: LUE deficits/detail RUE Deficits / Details: Using RUE to apply lotion and wash face. Does not reach over head but can reach to face and flex shoulder to ~90% with increased time and effort LUE Deficits / Details: using to rub lotion on LLE. no over head ROM noted. able to reach forward to assist in standing   Lower Extremity Assessment Lower Extremity Assessment: Defer to PT evaluation        Vision   Vision Assessment?: Vision impaired- to be further tested in functional context Additional Comments: L eye closed 100% of the session   Perception Perception Perception: Not tested   Praxis Praxis Praxis: Not tested    Cognition Arousal/Alertness: Lethargic Behavior During Therapy: Flat affect Overall Cognitive Status: Difficult to assess                                 General Comments: Patient lethargic and imited verbalization. Patient became more alert and demonstrated increased participation at end of session        Exercises      Shoulder Instructions       General Comments VSS on RA    Pertinent Vitals/ Pain       Pain Assessment Pain Assessment: Faces Faces Pain Scale: No hurt Pain Intervention(s): Monitored during session  Home Living                                           Prior Functioning/Environment              Frequency  Min 5X/week        Progress Toward Goals  OT Goals(current goals can now be found in the care plan section)  Progress towards OT goals: Progressing toward goals  Acute Rehab OT Goals Patient Stated Goal: did not state OT Goal Formulation: Patient unable to participate in goal setting Time For Goal Achievement: 03/31/22 Potential to Achieve Goals: Good ADL Goals Pt Will Perform Grooming: sitting;with min guard assist Pt Will Perform Upper Body Bathing: with min assist;sitting Pt Will Transfer to Toilet: with mod assist;stand pivot transfer;bedside commode Additional ADL Goal #1: Pt will complete bed mobility with min A as a precursor to ADLs Additional ADL Goal #2: Pt will follow simple one step commands 100% of the session to assist in ADLs  Plan Discharge plan needs to be updated    Co-evaluation    PT/OT/SLP Co-Evaluation/Treatment: Yes Reason for Co-Treatment: Complexity of the patient's impairments (multi-system involvement) PT goals addressed during session: Mobility/safety with mobility OT goals addressed during session: ADL's and self-care      AM-PAC OT "6 Clicks" Daily Activity     Outcome Measure   Help from another person eating meals?:  A Little Help from another person taking care of personal grooming?: A Little Help from another person toileting, which includes using toliet, bedpan, or urinal?: Total Help from another person bathing (including washing, rinsing, drying)?: A Lot Help from another person to put on and taking off regular upper body clothing?: A Lot Help from another person to put on and taking off regular lower body clothing?: A Lot 6 Click Score: 13    End of Session Equipment Utilized During Treatment: Other (comment);Gait belt (Stedy)  OT Visit Diagnosis: Unsteadiness on feet (R26.81);Other abnormalities of gait and mobility (R26.89);Muscle weakness (generalized)  (M62.81);Pain   Activity Tolerance Patient tolerated treatment well   Patient Left in chair;with call bell/phone within reach;with chair alarm set   Nurse Communication Mobility status        Time: [id number] OT Time Calculation (min): 65 min  Charges: OT General Charges $OT Visit: 1 Visit OT Treatments $Self Care/Home Management : 8-22 mins  Lodema Hong, [place]  Pager [phone] Office [place] 03/18/2022, 1:23 PM

## 2022-03-18 NOTE — Progress Notes (Signed)
Speech Language Pathology Treatment: Dysphagia  Patient Details Name: Cindy Brooks MRN: [id number] DOB: 1971-04-26 Today's Date: 03/18/2022 Time: 1000-1010 SLP Time Calculation (min) (ACUTE ONLY): 10 min  Assessment / Plan / Recommendation Clinical Impression  Difficult to wake up Cindy Brooks today. SLP repositioned and provided max verbal and tactile stimuli for pt to accept total assisted feeding. She would not initiate self feeding. Very reluctantly accepted a few bites and sips with prolonged mastication and constant reminders needed to sustain attention to oral transit. Had to discontinue meal given poor participation. Will continue efforts  HPI HPI: Pt is a 51 y/o female who presented to the ED after being found unresponsive in her care. Pt admitted 4/9 with thalamic stroke, treated for possible acute metabolic and wernicke's encphalopathy as well. She required intubation for airway protection on admission and had a tracheostomy placed on 4/16.  PEG on 4/18.Decannulated 5/19. MRI 4/19: Multiple foci of restricted diffusion in the bilateral frontal lobes and parietal lobes primarily.  Has suffered from severe encephalopathy, neurostorming. Started following commands around 5/15. PMH: HTN, TIA, ETOH abuse and medication noncompliance; ST f/u for cog/linguistic and dysphagia needs in acute setting.      SLP Plan  Continue with current plan of care      Recommendations for follow up therapy are one component of a multi-disciplinary discharge planning process, led by the attending physician.  Recommendations may be updated based on patient status, additional functional criteria and insurance authorization.    Recommendations  Diet recommendations: Dysphagia 2 (fine chop);Thin liquid                Plan: Continue with current plan of care           Kristina Bertone, Katherene Ponto  03/18/2022, 1:48 PM

## 2022-03-18 NOTE — Progress Notes (Signed)
Physical Therapy Treatment Patient Details Name: Cindy Brooks MRN: [id number] DOB: December 10, 1970 Today's Date: 03/18/2022   History of Present Illness Pt is a 51 y.o. female with history of HTN, TIA, ETOH abuse and medication noncompliance presenting to the ED after she was found by her family to be unresponsive in her car on 4/8. 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 Intubated 4/9. Tracheostormy 4/16.  Trach capped 5/16.    PT Comments    [facility] PT Session: Pt is extremely lethargic this morning requiring increased stimulation to wake up and participate. Pt requires modAx2 for bed mobility and transfers with [facility]. At end of session, pt more participatory and desires to work on ambulation. Pt continues to be limited in ambulation by flexed forward posture and knee flexion. Pt provided maximal multimodal cuing and able to perform 3 bouts of short distance ambulation with maxAx2. Working towards discharge home on 6/8. Focus of next session transfers to wheelchair and wheelchair management.      Recommendations for follow up therapy are one component of a multi-disciplinary discharge planning process, led by the attending physician.  Recommendations may be updated based on patient status, additional functional criteria and insurance authorization.  Follow Up Recommendations  Skilled nursing-short term rehab (<3 hours/day)     Assistance Recommended at Discharge Frequent or constant Supervision/Assistance  Patient can return home with the following Two people to help with walking and/or transfers;Two people to help with bathing/dressing/bathroom   Equipment Recommendations  Wheelchair (measurements PT);Wheelchair cushion (measurements PT);Other (comment);Rolling walker (2 wheels);BSC/3in1    Recommendations for Other Services  Rehab consult     Precautions / Restrictions Precautions Precautions: Fall Precaution Comments: PEG, stoma Restrictions Weight Bearing Restrictions: No     Mobility  Bed Mobility Overal bed mobility: Needs Assistance Bed Mobility: Rolling, Sidelying to Sit Rolling: Mod assist, +2 for physical assistance, +2 for safety/equipment Sidelying to sit: Mod assist, +2 for physical assistance, +2 for safety/equipment       General bed mobility comments: limited patient participation on this date.    Transfers Overall transfer level: Needs assistance Equipment used: Ambulation equipment used Transfers: Sit to/from Stand Sit to Stand: Mod assist, +2 physical assistance, +2 safety/equipment             Transfer via Lift Equipment: Stedy  Ambulation/Gait Ambulation/Gait assistance: Max assist, +2 physical assistance Gait Distance (Feet): 2 Feet (+4, +4) Assistive device: Rolling walker (2 wheels) Gait Pattern/deviations: Step-to pattern, Decreased step length - right, Decreased step length - left, Knee flexed in stance - right, Decreased weight shift to right, Decreased weight shift to left, Knees buckling, Trunk flexed, Narrow base of support Gait velocity: slowed Gait velocity interpretation: <1.31 ft/sec, indicative of household ambulator   General Gait Details: maxAx2 for steadying, vc for upright posture, head up hip rotation, knee extension, increased BoS,       Modified Rankin (Stroke Patients Only) Modified Rankin (Stroke Patients Only) Pre-Morbid Rankin Score: No symptoms Modified Rankin: Severe disability     Balance Overall balance assessment: Needs assistance Sitting-balance support: Feet supported, No upper extremity supported Sitting balance-Leahy Scale: Fair     Standing balance support: Bilateral upper extremity supported Standing balance-Leahy Scale: Poor                              Cognition  Arousal/Alertness: Lethargic Behavior  During Therapy: Flat affect Overall Cognitive Status: Difficult to assess                                 General Comments: Patient lethargic and imited verbalization. Patient became more alert and demonstrated increased participation at end of session           General Comments General comments (skin integrity, edema, etc.): VSS on RA      Pertinent Vitals/Pain Pain Assessment Pain Assessment: Faces Faces Pain Scale: No hurt Pain Intervention(s): Monitored during session, Repositioned     PT Goals (current goals can now be found in the care plan section) Acute Rehab PT Goals PT Goal Formulation: With patient Time For Goal Achievement: 03/25/22 Potential to Achieve Goals: Good Progress towards PT goals: Progressing toward goals    Frequency    Min 5X/week      PT Plan Current plan remains appropriate    Co-evaluation PT/OT/SLP Co-Evaluation/Treatment: Yes Reason for Co-Treatment: Complexity of the patient's impairments (multi-system involvement) PT goals addressed during session: Mobility/safety with mobility OT goals addressed during session: ADL's and self-care      AM-PAC PT "6 Clicks" Mobility   Outcome Measure  Help needed turning from your back to your side while in a flat bed without using bedrails?: A Lot Help needed moving from lying on your back to sitting on the side of a flat bed without using bedrails?: A Lot Help needed moving to and from a bed to a chair (including a wheelchair)?: Total Help needed standing up from a chair using your arms (e.g., wheelchair or bedside chair)?: Total Help needed to walk in hospital room?: Total Help needed climbing 3-5 steps with a railing? : Total 6 Click Score: 8    End of Session Equipment Utilized During Treatment: Gait belt Activity Tolerance: Patient limited by lethargy;Patient tolerated treatment well Patient left: in chair;with call bell/phone within reach;with chair alarm set Nurse  Communication: Mobility status PT Visit Diagnosis: Other abnormalities of gait and mobility (R26.89);Other symptoms and signs involving the nervous system (R29.898)     Time: [id number] PT Time Calculation (min) (ACUTE ONLY): 67 min  Charges:  $Gait Training: 8-22 mins $Therapeutic Activity: 8-22 mins                     Bettyjean Stefanski B. Migdalia Dk PT, DPT Acute Rehabilitation Services Please use secure chat or  Call Office [phone]    [place] 03/18/2022, 1:23 PM

## 2022-03-18 NOTE — TOC Progression Note (Addendum)
Transition of Care [facility]) - Progression Note    Patient Details  Name: Cindy Brooks MRN: [id number] Date of Birth: [birth year]  [facility] CM/SW Bruce, RN Phone Number: 03/18/2022, 2:09 PM  Clinical Narrative:    CM met with the patient at the bedside while [facility] Therapy Team was providing therapy for the day - see therapy notes.  I called and spoke with the patient's daughter, [name] on the phone and the patient has availability WC, WC ramp, 3:1 and bed side rail at the home at this time.  The daughter plans to order a RW today per PT/OT request.  The patient's daughter is aware that the patient will be discharged home on Thursday, but requests ambulance transport to the home.    [facility] called and transportation was arranged for 1 pm on Thursday, 6/8.  The daughter is bringing in FMLA paperwork to the hospital to be given to the attending physician, Dr. Karleen Hampshire to complete and attending is aware.  Bedside nursing will update the physician, otherwise I will provide the paperwork to the attending to complete.  The patient's daughter plans to take FMLA time at the home to care for the patient.  The daughter is bringing the glucometer from home and bedside nursing will providing teaching of glucometer for blood sugar monitoring, Insulin injection technique and foley care.  Patient will have PCP follow up with [facility] and is scheduled in the AVS.  The patient will be discharged home with foley - family aware and will follow up with urology office for voiding trial.  [facility] will be providing home health services and is aware of discharge date.  Discharge medications to be provided through [facility] and delivered to the patient's room on Thursday morning prior to discharge.  CM and MSW with DTP Team will continue to follow the patient for discharge to home by ambulance on Thursday, 03/21/22.   Expected Discharge Plan: [place] Barriers to Discharge: Inadequate or no insurance  Expected Discharge Plan and Services Expected Discharge Plan: [place] In-house Referral: PCP / [facility] Discharge Planning Services: CM Consult, Follow-up appt scheduled, Medication Assistance Post Acute Care Choice: [place] arrangements for the past 2 months: Single Family Home                           HH Arranged: PT   Date HH Agency Contacted: 03/13/22 Time HH Agency Contacted: 0901 Representative spoke with at [place]: [place] ([place])   Social Determinants of Health (SDOH) Interventions    Readmission Risk Interventions    03/13/2022    9:03 AM  Readmission Risk Prevention Plan  Transportation Screening Complete  PCP or Specialist Appt within 5-7 Days Complete  Home Care Screening Complete  Medication Review (RN CM) Complete

## 2022-03-19 LAB — GLUCOSE, CAPILLARY
Glucose-Capillary: 103 mg/dL — ABNORMAL HIGH (ref 70–99)
Glucose-Capillary: 114 mg/dL — ABNORMAL HIGH (ref 70–99)
Glucose-Capillary: 122 mg/dL — ABNORMAL HIGH (ref 70–99)
Glucose-Capillary: 173 mg/dL — ABNORMAL HIGH (ref 70–99)
Glucose-Capillary: 85 mg/dL (ref 70–99)
Glucose-Capillary: 91 mg/dL (ref 70–99)

## 2022-03-19 MED ORDER — TRAMADOL HCL 50 MG PO TABS
50.0000 mg | ORAL_TABLET | Freq: Four times a day (QID) | ORAL | Status: DC | PRN
  Administered 2022-03-19: 50 mg via ORAL
  Filled 2022-03-19: qty 1

## 2022-03-19 NOTE — Progress Notes (Signed)
[facility]                                                                               Cindy Brooks, is a 51 y.o. female, DOB - [birth year], [id number] Admit date - 01/20/2022    Outpatient Primary MD for the patient is Pcp, No  LOS - 27  days    Brief summary   Patient is a 51 year old female with history of uncontrolled hypertension, history of TIA who presented to the [facility] emergency department on 4/9 after being found unresponsive by his family.  Patient was found to be obtunded with copious secretions, subsequently intubated on 4/9 for airway protection.  Hospital course remarkable for uncontrolled hypertension, work-up also showed thalamic stroke.  Wernicke's encephalopathy also suspected.  Transferred to [facility] service.    PCCM was consulted,she has been de-cannulated on 5/19. plan for discharge next week home with home health.   4/9 Admit  4/12 ongoing high dose thiamine for possible Wernike's encephalopathy  4/13 IV sedation stopped, phenobarbital wean 4/17 still not waking up. Trach placed.  4/18 PEG placed. Spiking fever. Lactate spiked. Got fluid. Cleared.  4/22 Tolerated 6hrs of ATC day prior. No major events overnight, appears that stroke had [place] meeting with family 4/21 with no decision made 4/23 No issues overnight, on vent this am, remains unresponsive  4/27 changed to propranolol from metoprolol for neuro-storming. Started on vanc and cefepime for possible HCAP  4/28 trial on open ended trach collar 5/2 patient transferred to Hospitalist Service.  5/14 waking up and following simple commands. 5/15 downsized tracheostomy to #4 5/16 tracheostomy capping trials. 5/19 Decannulation 5/25 Foley placement for urinary retention 6/3 failed voiding trial and Replaced foley.  Plan for discharge on 6.05/2022 home with home health.    Assessment & Plan    Assessment and Plan:    Acute metabolic encephalopathy:  - Multifactorial: Thalamic stroke,  Warnicke's encephalopathy.  -MRI brain on 4/11 showed patchy restricted diffusion on both thalami and midbrain. 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  S/p high dose thiamine IV, followed by oral thiamine.  -Patient was also on phenobarbital which was weaned off. -Status post trach, PEG placement.   -Remains on amantadine, gabapentin. - she is alert and saying a few words, still very confused.    Acute hypoxic respiratory failure:  -Intubated on presentation for airway protection.  Prolonged ventilator use/tracheostomy.  Hospital course also remarkable for healthcare associated pneumonia, treated with antibiotics, completed the course.  repeat CXR does not show any acute cardiopulm disease.  -decannulated on 5/19 PCCM signed off now on RA   Thalamic stroke: continue with plavix and statin.    Phospholipid antibody with hypercoagulable state:  -Positive anticardiolipin IgM, lupus anticoagulant: - -Continue Plavix.  Started Lovenox for prophylaxis on 5/28.   Dysphagia: Status post PEG on 4/15. -Speech therapy closely following, recommending dysphagia 2 diet with thin liquid, recommend outpatient follow up with SLP on discharge.  Nocturnal feeds discontinued on 03/15/22 To encourage oral  feeding. Recommend free water pushes via PEG.    Hypertension: BP parameters are optimal,  Patient on Norvasc 10 mg daily and hydralazine 50 mg tid. No changes in meds.    History of hyperlipidemia: On Crestor   Obesity: BMI of 30.3 Outpatient follow up with Pcp.    Acute urinary retention: Foley placed 5/25.  Started her on flomax ,failed voiding trial. Replaced foley .  Recommend outpatient follow up with urology as outpatient and plan for voiding trial in the urology office.    Left Knee Pain: noted 5/30, L  knee swelling and tenderness, pt c/o severe pain.   x ray showed severe degenerative changes. Topical voltaren and ice packs PRN.  Tramadol added for pain control in addition the voltaren gel and tylenol.  Continue PT as tolerated.      Malnutrition Type:  Nutrition Problem: Inadequate oral intake Etiology: inability to eat  Nutrition Interventions:  Interventions: Ensure Enlive (each supplement provides 350kcal and 20 grams of protein), MVI  Estimated body mass index is 30.31 kg/m as calculated from the following:   Height as of this encounter: 5\' 4"  (1.626 m).   Weight as of this encounter: 80.1 kg.  Code Status: full code.  DVT Prophylaxis:  enoxaparin (LOVENOX) injection 40 mg Start: 03/10/22 1045 SCDs Start: 01/20/22 1012   Level of Care: Level of care: med surg.  Family Communication: none at bedside.   Disposition Plan:     Remains inpatient appropriate:plan for discharge home with home health next week, when DME is delivered.    Antimicrobials:   Anti-infectives (From admission, onward)    Start     Dose/Rate Route Frequency Ordered Stop   02/08/22 1800  vancomycin (VANCOREADY) IVPB 1500 mg/300 mL  Status:  Discontinued        1,500 mg 150 mL/hr over 120 Minutes Intravenous Every 24 hours 02/07/22 1712 02/09/22 1046   02/07/22 1800  vancomycin (VANCOREADY) IVPB 1750 mg/350 mL        1,750 mg 175 mL/hr over 120 Minutes Intravenous  Once 02/07/22 1708 02/07/22 2046   02/07/22 1700  ceFEPIme (MAXIPIME) 2 g in sodium chloride 0.9 % 100 mL IVPB        2 g 200 mL/hr over 30 Minutes Intravenous Every 8 hours 02/07/22 1706 02/14/22 0856   01/20/22 2000  cefTRIAXone (ROCEPHIN) 2 g in sodium chloride 0.9 % 100 mL IVPB        2 g 200 mL/hr over 30 Minutes Intravenous Every 24 hours 01/20/22 0634 01/24/22 2022   01/20/22 0415  cefTRIAXone (ROCEPHIN) 2 g in sodium chloride 0.9 % 100 mL IVPB        2 g 200 mL/hr over 30 Minutes Intravenous  Once 01/20/22 0414 01/20/22 0537         Medications  Scheduled Meds:  amantadine  100 mg Oral BID   amLODipine  10 mg Oral Daily   Chlorhexidine Gluconate Cloth  6 each Topical Daily   clopidogrel  75 mg Oral Daily   diclofenac Sodium  4 g Topical QID   docusate  100 mg Oral BID   enoxaparin (LOVENOX) injection  40 mg Subcutaneous Q24H   feeding supplement  237 mL Oral TID BM   folic acid  1 mg Oral Daily   free water  50 mL Per Tube Q6H   gabapentin  100 mg Oral BID   glycopyrrolate  1 mg Oral BID   guaiFENesin  100 mg Oral TID AC &  HS   hydrALAZINE  50 mg Oral Q8H   insulin aspart  0-15 Units Subcutaneous Q4H   multivitamin with minerals  1 tablet Oral Daily   propranolol  40 mg Oral TID   rosuvastatin  40 mg Oral Daily   senna  1 tablet Oral Daily   tamsulosin  0.4 mg Oral QPC supper   thiamine  100 mg Oral Daily   Continuous Infusions:  sodium chloride Stopped (02/11/22 0928)   PRN Meds:.sodium chloride, acetaminophen (TYLENOL) oral liquid 160 mg/5 mL, Gerhardt's butt cream, ipratropium-albuterol, labetalol, metoprolol tartrate, polyethylene glycol, promethazine    Subjective:   Cindy Brooks was seen and examined today.  Knee pain.   Objective:   Vitals:   03/19/22 0337 03/19/22 0500 03/19/22 0538 03/19/22 0747  BP: 120/88  (!) 141/90 119/73  Pulse: 80   83  Resp:    14  Temp: 98.2 F (36.8 C)   97.8 F (36.6 C)  TempSrc: Oral   Oral  SpO2: 95%   97%  Weight:  80.1 kg    Height:        Intake/Output Summary (Last 24 hours) at 03/19/2022 1048 Last data filed at 03/19/2022 0343 Gross per 24 hour  Intake --  Output 1200 ml  Net -1200 ml    Filed Weights   03/17/22 0346 03/18/22 0432 03/19/22 0500  Weight: 77.2 kg 74.4 kg 80.1 kg     Exam General exam: ill appearing lady not in distress.  Respiratory system: Clear to auscultation. Respiratory effort normal. Cardiovascular system: S1 & S2 heard, RRR. No JVD, No pedal edema. Gastrointestinal system: Abdomen is nondistended, soft  and nontender.  Normal bowel sounds heard. Central nervous system: Alert and oriented to self only. Able to move all extremities.  Extremities: no pedal edema.  Skin: No rashes, lesions or ulcers Psychiatry: unable to assess.         Data Reviewed:  I have personally reviewed following labs and imaging studies   CBC Lab Results  Component Value Date   WBC 6.0 03/16/2022   RBC 4.11 03/16/2022   HGB 11.2 (L) 03/16/2022   HCT 33.7 (L) 03/16/2022   MCV 82.0 03/16/2022   MCH 27.3 03/16/2022   PLT 291 03/16/2022   MCHC 33.2 03/16/2022   RDW 15.7 (H) 03/16/2022   LYMPHSABS 2.1 03/16/2022   MONOABS 0.7 03/16/2022   EOSABS 0.1 03/16/2022   BASOSABS 0.0 [id number]     Last metabolic panel Lab Results  Component Value Date   NA 137 03/16/2022   K 4.1 03/16/2022   CL 104 03/16/2022   CO2 22 03/16/2022   BUN 8 03/16/2022   CREATININE 0.69 03/16/2022   GLUCOSE 98 03/16/2022   GFRNONAA >60 03/16/2022   CALCIUM 9.7 03/16/2022   PHOS 4.0 02/25/2022   PROT 7.5 03/16/2022   ALBUMIN 3.5 03/16/2022   BILITOT 0.7 03/16/2022   ALKPHOS 107 03/16/2022   AST 70 (H) 03/16/2022   ALT 184 (H) 03/16/2022   ANIONGAP 11 03/16/2022    CBG (last 3)  Recent Labs    03/18/22 2302 03/19/22 0330 03/19/22 0746  GLUCAP 114* 114* 103*       Coagulation Profile: No results for input(s): INR, PROTIME in the last 168 hours.   Radiology Studies: No results found.     Hosie Poisson M.D. [facility] 03/19/2022, 10:48 AM  Available via Epic secure chat 7am-7pm After 7 pm, please refer to night coverage provider listed on amion.

## 2022-03-19 NOTE — Progress Notes (Signed)
Occupational Therapy Treatment Patient Details Name: Cindy Brooks MRN: [id number] DOB: 16-Aug-1971 Today's Date: 03/19/2022   History of present illness Pt is a 51 y.o. female with history of HTN, TIA, ETOH abuse and medication noncompliance presenting to the ED after she was found by her family to be unresponsive in her car on 4/8. 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 Intubated 4/9. Tracheostormy 4/16.  Trach capped 5/16.   OT comments  [facility] OT/PT session: Patient up in recliner with daughter present. Patient was mod assist of 2 to stand and take steps with daughter assisting with cues and encouragement. Patient stood into stedy and taken to bathroom to perform self care at sink. Patient able to perform grooming and UB bathing seated on stedy pads and min assist with verbal cues for sequencing. Patient able to bathe front of peri area and stood for back but believed she needed to transfer to toilet and required max assist to clean while standing in stedy. Patient was mod assist to [employer] top and mod/max for scrub bottoms. Patient continues to make good progress and will continue to be followed by [facility] with plans to go to daughters home this week.    Recommendations for follow up therapy are one component of a multi-disciplinary discharge planning process, led by the attending physician.  Recommendations may be updated based on patient status, additional functional criteria and insurance authorization.    Follow Up Recommendations  Skilled nursing-short term rehab (<3 hours/day) (with possible discharge to daughters home this week)    Assistance Recommended at Discharge Frequent or constant Supervision/Assistance  Patient can return home with the following  Two people to help with walking and/or  transfers;Two people to help with bathing/dressing/bathroom;Assistance with cooking/housework;Assistance with feeding;Direct supervision/assist for medications management;Direct supervision/assist for financial management;Assist for transportation;Help with stairs or ramp for entrance   Equipment Recommendations  BSC/3in1;Wheelchair (measurements OT);Wheelchair cushion (measurements OT);Tub/shower bench    Recommendations for Other Services      Precautions / Restrictions Precautions Precautions: Fall Precaution Comments: PEG, stoma Restrictions Weight Bearing Restrictions: No       Mobility Bed Mobility Overal bed mobility: Needs Assistance             General bed mobility comments: up in recliner and remained in recliner at end of session    Transfers Overall transfer level: Needs assistance Equipment used: Rolling walker (2 wheels), Ambulation equipment used Transfers: Sit to/from Stand Sit to Stand: Mod assist, +2 physical assistance, +2 safety/equipment           General transfer comment: sit to stands and took steps with RW with mod assist +2. Transfers performed with [facility] Transfer via Lift Equipment: [employer] Overall balance assessment: Needs assistance Sitting-balance support: Feet supported, No upper extremity supported Sitting balance-Leahy Scale: Fair Sitting balance - Comments: seated in recliner and on stedy pads Postural control: Posterior lean Standing balance support: Bilateral upper extremity supported Standing balance-Leahy Scale: Poor Standing balance comment: reliant on UE support. cues for posture                           ADL either performed or assessed with clinical judgement   ADL Overall ADL's : Needs assistance/impaired     Grooming: Wash/dry hands;Wash/dry face;Oral care;Sitting;Minimal assistance;Applying deodorant Grooming Details (indicate cue type and  reason): seated on Stedy pads at sink with assistance to  with toothpaste Upper Body Bathing: Minimal assistance;Sitting Upper Body Bathing Details (indicate cue type and reason): assistance for back and verbal cues for sequencing while seated on Stedy pads Lower Body Bathing: Moderate assistance Lower Body Bathing Details (indicate cue type and reason): patient able to wash front of periarea while seated on stedy pads and max assist for back while standing in stedy Upper Body Dressing : Moderate assistance;Sitting Upper Body Dressing Details (indicate cue type and reason): pullover top Lower Body Dressing: Moderate assistance;Maximal assistance;Sit to/from stand Lower Body Dressing Details (indicate cue type and reason): required assistance to thread legs into clothing and to pull up while standing. Patient Engineer, technical sales: Moderate assistance;+2 for physical assistance;+2 for safety/equipment Toilet Transfer Details (indicate cue type and reason): with stedy Toileting- Clothing Manipulation and Hygiene: Maximal assistance;Sit to/from stand Toileting - Clothing Manipulation Details (indicate cue type and reason): standing in stedy       General ADL Comments: increased patient participation    Extremity/Trunk Assessment Upper Extremity Assessment RUE Deficits / Details: Using RUE to apply lotion and wash face. Does not reach over head but can reach to face and flex shoulder to ~90% with increased time and effort RUE Sensation: WNL RUE Coordination: decreased fine motor;decreased gross motor LUE Sensation: WNL LUE Coordination: decreased gross motor;decreased fine motor            Vision       Perception     Praxis      Cognition Arousal/Alertness: Awake/alert Behavior During Therapy: Flat affect Overall Cognitive Status: Difficult to assess Area of Impairment: Problem solving, Orientation, Memory, Awareness, Safety/judgement                 Orientation Level: Disoriented to, Time Current Attention  Level: Sustained Memory: Decreased short-term memory Following Commands: Follows one step commands with increased time, Follows multi-step commands with increased time Safety/Judgement: Decreased awareness of safety Awareness: Emergent Problem Solving: Slow processing, Requires verbal cues General Comments: more alert today. More vocal        Exercises      Shoulder Instructions       General Comments      Pertinent Vitals/ Pain       Pain Assessment Pain Assessment: Faces Faces Pain Scale: Hurts little more Pain Location: left knee Pain Descriptors / Indicators: Aching, Discomfort, Grimacing Pain Intervention(s): Limited activity within patient's tolerance, Monitored during session, Patient requesting pain meds-RN notified  Home Living                                          Prior Functioning/Environment              Frequency  Min 5X/week        Progress Toward Goals  OT Goals(current goals can now be found in the care plan section)  Progress towards OT goals: Progressing toward goals  Acute Rehab OT Goals OT Goal Formulation: Patient unable to participate in goal setting Time For Goal Achievement: 03/31/22 Potential to Achieve Goals: Good ADL Goals Pt Will Perform Grooming: sitting;with min guard assist Pt Will Perform Upper Body Bathing: with min assist;sitting Pt Will Transfer to Toilet: with mod assist;stand pivot transfer;bedside commode Additional ADL Goal #1: Pt will complete bed mobility with min A as a precursor to ADLs Additional ADL Goal #2:  Pt will follow simple one step commands 100% of the session to assist in ADLs  Plan Discharge plan needs to be updated    Co-evaluation    PT/OT/SLP Co-Evaluation/Treatment: Yes Reason for Co-Treatment: For patient/therapist safety;To address functional/ADL transfers   OT goals addressed during session: ADL's and self-care      AM-PAC OT "6 Clicks" Daily Activity     Outcome  Measure   Help from another person eating meals?: A Little Help from another person taking care of personal grooming?: A Little Help from another person toileting, which includes using toliet, bedpan, or urinal?: A Lot Help from another person bathing (including washing, rinsing, drying)?: A Lot Help from another person to put on and taking off regular upper body clothing?: A Lot Help from another person to put on and taking off regular lower body clothing?: A Lot 6 Click Score: 14    End of Session Equipment Utilized During Treatment: Gait belt;Rolling walker (2 wheels);Other (comment) Charlaine Dalton)  OT Visit Diagnosis: Unsteadiness on feet (R26.81);Other abnormalities of gait and mobility (R26.89);Muscle weakness (generalized) (M62.81);Pain Pain - Right/Left: Left Pain - part of body: Knee   Activity Tolerance Patient tolerated treatment well   Patient Left in chair;with call bell/phone within reach;with chair alarm set   Nurse Communication Mobility status        Time: [id number] OT Time Calculation (min): 50 min  Charges: OT General Charges $OT Visit: 1 Visit OT Treatments $Self Care/Home Management : 23-37 mins  Lodema Hong, [place]  Pager [phone] Office [place] 03/19/2022, 2:00 PM

## 2022-03-19 NOTE — Progress Notes (Signed)
Physical Therapy Treatment Patient Details Name: Cindy Brooks MRN: [id number] DOB: 02/15/1971 Today's Date: 03/19/2022   History of Present Illness Pt is a 51 y.o. female with history of HTN, TIA, ETOH abuse and medication noncompliance presenting to the ED after she was found by her family to be unresponsive in her car on 4/8. 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 Intubated 4/9. Tracheostormy 4/16.  Trach capped 5/16.    PT Comments    STAR PT Session: Pt sitting up in recliner talking to daughter on entry, more verbal than yesterday and actually completing sentences. Therapist utilize daughters presence as Geophysicist/field seismologist. Pt able to ambulate towards daughter with improved posture and mechanics and is able to give her a hug and kiss in standing with release of R UE from RW. Daughter had to leave and pt work in [place] to perform self care and using toilet. Pt continues to make progress towards her goals and when awake and alert will be able to provide more assistance to family in her mobility and self care at discharge. PT will continue to work with pt acutely.    Recommendations for follow up therapy are one component of a multi-disciplinary discharge planning process, led by the attending physician.  Recommendations may be updated based on patient status, additional functional criteria and insurance authorization.  Follow Up Recommendations  Skilled nursing-short term rehab (<3 hours/day)     Assistance Recommended at Discharge Frequent or constant Supervision/Assistance  Patient can return home with the following Two people to help with walking and/or transfers;Two people to help with bathing/dressing/bathroom   Equipment Recommendations  Wheelchair (measurements PT);Wheelchair cushion (measurements PT);Other  (comment);Rolling walker (2 wheels);BSC/3in1    Recommendations for Other Services       Precautions / Restrictions Precautions Precautions: Fall Precaution Comments: PEG, stoma Restrictions Weight Bearing Restrictions: No     Mobility  Bed Mobility Overal bed mobility: Needs Assistance             General bed mobility comments: up in recliner and remained in recliner at end of session    Transfers Overall transfer level: Needs assistance Equipment used: Rolling walker (2 wheels), Ambulation equipment used Transfers: Sit to/from Stand Sit to Stand: Mod assist, +2 physical assistance, +2 safety/equipment, Min assist           General transfer comment: minA-modA x2 for multiple sit<>stand transfers to ambulate, and perform self care. increasing level of assist with fatigue Transfer via Lift Equipment: Stedy  Ambulation/Gait Ambulation/Gait assistance: Max assist, +2 physical assistance Gait Distance (Feet): 4 Feet (+2) Assistive device: Rolling walker (2 wheels) Gait Pattern/deviations: Step-to pattern, Decreased step length - right, Decreased step length - left, Knee flexed in stance - right, Decreased weight shift to right, Decreased weight shift to left, Knees buckling, Trunk flexed, Narrow base of support Gait velocity: slowed Gait velocity interpretation: <1.31 ft/sec, indicative of household ambulator   General Gait Details: pt daughter present and good motivating factor, pt able to perform 2 bouts of ambulation, finding it easier to look up at daughter in front of her than to follow command for upright posture, Pt very determined and able to ambulate 4 feet toward daughter and on second bout able to ambulate 2 feet lift R UE off RW to give daughter a hug and kiss before going to school  Balance Overall balance assessment: Needs assistance Sitting-balance support: Feet supported, No upper extremity supported Sitting balance-Leahy Scale: Fair Sitting  balance - Comments: seated in recliner and on stedy pads Postural control: Posterior lean Standing balance support: Bilateral upper extremity supported Standing balance-Leahy Scale: Poor Standing balance comment: reliant on UE support. cues for posture                            Cognition Arousal/Alertness: Awake/alert Behavior During Therapy: Flat affect Overall Cognitive Status: Difficult to assess Area of Impairment: Problem solving, Orientation, Memory, Awareness, Safety/judgement                 Orientation Level: Disoriented to, Time Current Attention Level: Sustained Memory: Decreased short-term memory Following Commands: Follows one step commands with increased time, Follows multi-step commands with increased time Safety/Judgement: Decreased awareness of safety Awareness: Emergent Problem Solving: Slow processing, Requires verbal cues General Comments: more alert today. More vocal           General Comments General comments (skin integrity, edema, etc.): VSS on RA      Pertinent Vitals/Pain Pain Assessment Pain Assessment: Faces Faces Pain Scale: Hurts little more Pain Location: left knee Pain Descriptors / Indicators: Aching, Discomfort, Grimacing Pain Intervention(s): Limited activity within patient's tolerance, Monitored during session, Repositioned, Patient requesting pain meds-RN notified, RN gave pain meds during session     PT Goals (current goals can now be found in the care plan section) Acute Rehab PT Goals PT Goal Formulation: With patient Time For Goal Achievement: 03/25/22 Potential to Achieve Goals: Good Progress towards PT goals: Progressing toward goals    Frequency    Min 5X/week      PT Plan Current plan remains appropriate    Co-evaluation PT/OT/SLP Co-Evaluation/Treatment: Yes Reason for Co-Treatment: For patient/therapist safety PT goals addressed during session: Mobility/safety with mobility OT goals addressed  during session: ADL's and self-care      AM-PAC PT "6 Clicks" Mobility   Outcome Measure  Help needed turning from your back to your side while in a flat bed without using bedrails?: A Lot Help needed moving from lying on your back to sitting on the side of a flat bed without using bedrails?: A Lot Help needed moving to and from a bed to a chair (including a wheelchair)?: Total Help needed standing up from a chair using your arms (e.g., wheelchair or bedside chair)?: Total Help needed to walk in hospital room?: Total Help needed climbing 3-5 steps with a railing? : Total 6 Click Score: 8    End of Session Equipment Utilized During Treatment: Gait belt Activity Tolerance: Patient tolerated treatment well Patient left: in chair;with call bell/phone within reach;with chair alarm set Nurse Communication: Mobility status;Patient requests pain meds PT Visit Diagnosis: Other abnormalities of gait and mobility (R26.89);Other symptoms and signs involving the nervous system (B37.943)     Time: 2761-4709 PT Time Calculation (min) (ACUTE ONLY): 50 min  Charges:  $Gait Training: 8-22 mins                     Angeleigh Chiasson B. Beverely Risen PT, DPT Acute Rehabilitation Services Please use secure chat or  Call Office [phone]    Elon Alas [facility] 03/19/2022, 2:19 PM

## 2022-03-19 NOTE — Progress Notes (Addendum)
This chaplain is present for F/U spiritual care with the Pt. The Pt. is awake sitting up in the bedside recliner. The Pt. is moving her breakfast from the plate to the bowl with a spoon as the chaplain talks.   The chaplain affirmed the Pt. efforts in therapy and the plans for Pt. discharge on Thursday. The Pt. moved her head from left to right in response to the chaplain's statement and the Pt. eyes teared. The Pt. RN-Morgan entered the room to give the Pt. medication. The RN and chaplain offered a listening presence. The Pt. was not able to articulate her thoughts. The Pt. accepted the chaplain's invitation for a revisit later today.  **1550 The Pt. is sleeping at the time of the chaplain's revisit.  Chaplain Stephanie Acre [phone]

## 2022-03-20 DIAGNOSIS — R1310 Dysphagia, unspecified: Secondary | ICD-10-CM

## 2022-03-20 DIAGNOSIS — G9341 Metabolic encephalopathy: Secondary | ICD-10-CM

## 2022-03-20 DIAGNOSIS — I63513 Cerebral infarction due to unspecified occlusion or stenosis of bilateral middle cerebral arteries: Secondary | ICD-10-CM

## 2022-03-20 DIAGNOSIS — R7989 Other specified abnormal findings of blood chemistry: Secondary | ICD-10-CM

## 2022-03-20 DIAGNOSIS — D649 Anemia, unspecified: Secondary | ICD-10-CM

## 2022-03-20 DIAGNOSIS — E870 Hyperosmolality and hypernatremia: Secondary | ICD-10-CM

## 2022-03-20 DIAGNOSIS — I69391 Dysphagia following cerebral infarction: Secondary | ICD-10-CM

## 2022-03-20 DIAGNOSIS — I10 Essential (primary) hypertension: Secondary | ICD-10-CM

## 2022-03-20 DIAGNOSIS — J189 Pneumonia, unspecified organism: Secondary | ICD-10-CM

## 2022-03-20 DIAGNOSIS — D6861 Antiphospholipid syndrome: Secondary | ICD-10-CM

## 2022-03-20 DIAGNOSIS — I639 Cerebral infarction, unspecified: Secondary | ICD-10-CM

## 2022-03-20 DIAGNOSIS — R338 Other retention of urine: Secondary | ICD-10-CM

## 2022-03-20 LAB — GLUCOSE, CAPILLARY
Glucose-Capillary: 86 mg/dL (ref 70–99)
Glucose-Capillary: 92 mg/dL (ref 70–99)
Glucose-Capillary: 104 mg/dL — ABNORMAL HIGH (ref 70–99)
Glucose-Capillary: 100 mg/dL — ABNORMAL HIGH (ref 70–99)
Glucose-Capillary: 110 mg/dL — ABNORMAL HIGH (ref 70–99)
Glucose-Capillary: 124 mg/dL — ABNORMAL HIGH (ref 70–99)

## 2022-03-20 MED ORDER — INSULIN ASPART 100 UNIT/ML IJ SOLN: 0.0000 [IU] | Freq: Three times a day (TID) | INTRAMUSCULAR | Status: DC

## 2022-03-20 NOTE — Hospital Course (Addendum)
51 year old F with PMH of HTN and TIA brought to ED after found unresponsive  by family member on 01/20/2022.  On arrival, she was obtunded with copious secretion requiring intubation and mechanical ventilation.  MRI brain concerning for bilateral thalamic, midbrain, multiple punctate acute to early subacute bilateral cerebral infarct, multiple scattered chronic microhemorrhages about the deep gray nuclei and cerebellum and underlying chronic microvascular ischemic disease.  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  TTE without significant finding.  There was also concern about Wernick encephalopathy.  She was started on high-dose thiamine.  Spot and LTM EEG without seizure or epileptiform discharge.  Not able to liberate from vent.  She had trach placed on 4/17 and PEG placed on 4/18.  Eventually, respiratory status improved.  She was decannulated on 5/19.  She was also upgraded to dysphagia 2 diet and tube feed discontinued.  Overall, some improvement in mental status.  Therapy recommended SNF but daughter to take patient home with home health and DME.  Hospital course complicated by HCAP and urinary retention.  She completed antibiotic course for HCAP.  She is discharged with indwelling Foley catheter after she failed voiding trial.  May consider outpatient follow-up with urology for voiding trial.

## 2022-03-20 NOTE — Assessment & Plan Note (Addendum)
Noted on MRI brain.  Likely due to uncontrolled hypertension and intracranial stenosis.  Also some concern about phospholipid antibody with hypercoagulable state.  Imaging including MRI, CT and echocardiogram as above. -Continue Plavix Crestor and amantadine. -Continue PT/OT -Outpatient follow-up with neurology

## 2022-03-20 NOTE — Assessment & Plan Note (Addendum)
Dysphagia improved.  SLP recommended dysphagia 2 diet.  Tube feed discontinued.

## 2022-03-20 NOTE — TOC Progression Note (Addendum)
Transition of Care [facility]) - Progression Note    Patient Details  Name: Cindy Brooks MRN: [id number] Date of Birth: June 05, 1971  Transition of [facility]) CM/SW Contact  Janae Bridgeman, RN Phone Number: 03/20/2022, 12:05 PM  Clinical Narrative:    CM called and spoke with the patient's daughter, [name] and she requested FMLA paperwork be completed so that she can provide 24 hour care in the home once the patient is discharged home tomorrow.  FMLA paperwork was completed and Dr. Alanda Slim will sign the documents so that I can return them to the daughter.  I called and spoke with the patient's daughter and she was in agreement to have patient's clinicals faxed to [facility] so that PCP could be provided at the home.  Clinicals were sent to fax# [phone] - unable to provide House calls since not in network with patient's insurance.  PCP- [facility] will be following the patient for PCP.  Pharmacy - Dr. Alanda Slim will call discharge prescriptions to the [facility] listed in the chart - [address] [place], [place].  FMLA paperwork was completed and reviewed/signed by Dr. Alanda Slim - emailed to the daughter at home so that she can provide 24 hour care to the patient in the home.  I called and spoke with [facility] and they will follow up in the home for start of care likely on Friday - 03/22/22 for RN/PT and aide if available.  CM and MSW with DTP Team will continue to follow the patient for discharge to home tomorrow by [facility] ambulance transportation.   Expected Discharge Plan: Home w Home Health Services Barriers to Discharge: Inadequate or no insurance  Expected Discharge Plan and Services Expected Discharge Plan: Home w Home Health Services In-house Referral: PCP / [facility] Discharge Planning Services: CM Consult, Follow-up appt scheduled, Medication Assistance Post Acute Care Choice: Home Health Living arrangements for the past 2  months: Single Family Home                           HH Arranged: PT   Date HH Agency Contacted: 03/13/22 Time HH Agency Contacted: 0901 Representative spoke with at [facility] Agency: [facility] ([facility])   Social Determinants of Health (SDOH) Interventions    Readmission Risk Interventions    03/13/2022    9:03 AM  Readmission Risk Prevention Plan  Transportation Screening Complete  PCP or Specialist Appt within 5-7 Days Complete  Home Care Screening Complete  Medication Review (RN CM) Complete

## 2022-03-20 NOTE — Progress Notes (Signed)
PROGRESS NOTE  Lunarose Dapp OHF:[id number] DOB: Dec 23, 1970   PCP: Pcp, No  Patient is from: Home.  DOA: 01/20/2022 LOS: 59  Chief complaints Chief Complaint  Patient presents with   Altered Mental Status     Brief Narrative / Interim history: 51 year old F with PMH of HTN and TIA brought to ED after found unresponsive  by family member on 01/20/2022.  On arrival, she was obtunded with copious secretion requiring intubation and mechanical ventilation.  MRI brain concerning for bilateral thalamic, midbrain, multiple punctate acute to early subacute bilateral cerebral infarct, multiple scattered chronic microhemorrhages about the deep gray nuclei and cerebellum and underlying chronic microvascular ischemic disease.  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  TTE without significant finding.  There was also concern about Wernick encephalopathy.  She was started on high-dose thiamine.  Spot and LTM EEG without seizure or epileptiform discharge.  Not able to liberate from vent.  She had trach placed on 4/17 and PEG placed on 4/18.  Eventually, respiratory status improved.  She was decannulated on 5/19.  She was also upgraded to dysphagia 2 diet and tube feed discontinued.  Overall, some improvement in mental status.  Therapy recommended SNF but daughter to take patient home with home health and DME.  Hospital course complicated by HCAP and urinary retention.  She completed antibiotic course for HCAP.  She is discharged with indwelling Foley catheter after she failed voiding trial.  May consider outpatient follow-up with urology for voiding trial.   Subjective: Seen and examined earlier this morning.  No major events overnight of this morning.  No complaints but not a great historian.  She is awake and oriented to self.  Follows commands.  She responds no to  pain.  Objective: Vitals:   03/19/22 2343 03/20/22 0316 03/20/22 0751 03/20/22 1122  BP: 116/74 112/78 112/65 112/85  Pulse: 79 90 91 98  Resp: 18 20 18 18   Temp: 98.9 F (37.2 C) 99.3 F (37.4 C) 98.6 F (37 C) 98.5 F (36.9 C)  TempSrc: Axillary Axillary Oral Oral  SpO2: 99% 99% 98% 98%  Weight:  76.1 kg    Height:        Examination:  GENERAL: No apparent distress.  Nontoxic. HEENT: MMM.  Vision and hearing grossly intact.  Pupils look dilated NECK: Supple.  No apparent JVD.  RESP:  No IWOB.  Fair aeration bilaterally. CVS:  RRR. Heart sounds normal.  ABD/GI/GU: BS+. Abd soft, NTND.  MSK/EXT:  Moves extremities. No apparent deformity.  Trace whole extremity edema. SKIN: no apparent skin lesion or wound NEURO: awake but not quite alert.  Oriented to self.  Follows command.  Pupils look dilated but symmetric.  No apparent focal neuro deficit. PSYCH: Calm. Normal affect.   Procedures:  4/9-intubation 4/17-trach 4/18-PEG 5/19-decannulation  Assessment and Plan: * Acute bilateral thalamic, midbrain and bilateral cerebral ischemic CVA Noted on MRI brain.  Likely due to uncontrolled hypertension and intracranial stenosis.  Also some concern about phospholipid antibody with hypercoagulable state.  Imaging including MRI, CT and echocardiogram as above. -Continue Plavix and Crestor per neurology  Acute respiratory failure with hypoxia (HCC) S/p intubation, mechanical ventilation, tracheostomy and decannulation.  Currently on room air.  Resolved.  Acute metabolic encephalopathy Multifactorial including acute CVA and possible Warnicke encephalopathy.  Completed course of high thiamine. She is oriented to self and  follows commands. -Reorientation and delirium precautions -Avoid or minimize sedating medications  Acute urinary retention Failed voiding trial and Foley catheter reinserted -Outpatient follow-up with urology for another voiding trial  Dysphagia Improved.  SLP  recommended dysphagia 2 diet.  Tube feed discontinued.  Antiphospholipid antibody with hypercoagulable state ([place]) Positive anticardiolipin IgM, lupus anticoagulant: - Continue Plavix.  Subcu Lovenox for VTE prophylaxis.  Normocytic anemia Stable. -Recheck CBC in 1 to 2 weeks  Elevated LFTs Stable. -Recheck LFT in 1 to 2 weeks  HCAP (healthcare-associated pneumonia) Resolved.  Completed antibiotic course.  On room air.  Hypernatremia Resolved.  PEG  status ([place]) Tube feed discontinued  Uncontrolled hypertension Now normotensive. -Continue amlodipine, Inderal and hydralazine.    DVT prophylaxis:  enoxaparin (LOVENOX) injection 40 mg Start: 03/10/22 1045 SCDs Start: 01/20/22 1012  Code Status: Full code Family Communication: Patient and/or RN. Available if any question.  Level of care: Med-Surg Status is: Inpatient Remains inpatient appropriate because: Safe disposition   Final disposition: Likely home on 01/19/2022 Consultants:  Neurology Pulmonology  Sch Meds:  Scheduled Meds:  amantadine  100 mg Oral BID   amLODipine  10 mg Oral Daily   Chlorhexidine Gluconate Cloth  6 each Topical Daily   clopidogrel  75 mg Oral Daily   diclofenac Sodium  4 g Topical QID   docusate  100 mg Oral BID   enoxaparin (LOVENOX) injection  40 mg Subcutaneous Q24H   feeding supplement  237 mL Oral TID BM   folic acid  1 mg Oral Daily   free water  50 mL Per Tube Q6H   gabapentin  100 mg Oral BID   glycopyrrolate  1 mg Oral BID   guaiFENesin  100 mg Oral TID AC & HS   hydrALAZINE  50 mg Oral Q8H   insulin aspart  0-15 Units Subcutaneous Q4H   multivitamin with minerals  1 tablet Oral Daily   propranolol  40 mg Oral TID   rosuvastatin  40 mg Oral Daily   senna  1 tablet Oral Daily   tamsulosin  0.4 mg Oral QPC supper   thiamine  100 mg Oral Daily   Continuous Infusions:  sodium chloride Stopped (02/11/22 0928)   PRN Meds:.sodium chloride, acetaminophen (TYLENOL) oral  liquid 160 mg/5 mL, Gerhardt's butt cream, ipratropium-albuterol, labetalol, metoprolol tartrate, polyethylene glycol, promethazine, traMADol  Antimicrobials: Anti-infectives (From admission, onward)    Start     Dose/Rate Route Frequency Ordered Stop   02/08/22 1800  vancomycin (VANCOREADY) IVPB 1500 mg/300 mL  Status:  Discontinued        1,500 mg 150 mL/hr over 120 Minutes Intravenous Every 24 hours 02/07/22 1712 02/09/22 1046   02/07/22 1800  vancomycin (VANCOREADY) IVPB 1750 mg/350 mL        1,750 mg 175 mL/hr over 120 Minutes Intravenous  Once 02/07/22 1708 02/07/22 2046   02/07/22 1700  ceFEPIme (MAXIPIME) 2 g in sodium chloride 0.9 % 100 mL IVPB        2 g 200 mL/hr over 30 Minutes Intravenous Every 8 hours 02/07/22 1706 02/14/22 0856   01/20/22 2000  cefTRIAXone (ROCEPHIN) 2 g in sodium chloride 0.9 % 100 mL IVPB        2 g 200 mL/hr over 30 Minutes Intravenous Every 24 hours 01/20/22 0634 01/24/22 2022   01/20/22 0415  cefTRIAXone (ROCEPHIN) 2 g in sodium chloride 0.9 % 100 mL IVPB        2 g 200 mL/hr over 30 Minutes  Intravenous  Once 01/20/22 0414 01/20/22 0537        I have personally reviewed the following labs and images: CBC: Recent Labs  Lab 03/16/22 1057  WBC 6.0  NEUTROABS 3.1  HGB 11.2*  HCT 33.7*  MCV 82.0  PLT 291   BMP &GFR Recent Labs  Lab 03/16/22 1057  NA 137  K 4.1  CL 104  CO2 22  GLUCOSE 98  BUN 8  CREATININE 0.69  CALCIUM 9.7   Estimated Creatinine Clearance: 84.1 mL/min (by C-G formula based on SCr of 0.69 mg/dL). Liver & Pancreas: Recent Labs  Lab 03/16/22 1057  AST 70*  ALT 184*  ALKPHOS 107  BILITOT 0.7  PROT 7.5  ALBUMIN 3.5   No results for input(s): LIPASE, AMYLASE in the last 168 hours. No results for input(s): AMMONIA in the last 168 hours. Diabetic: No results for input(s): HGBA1C in the last 72 hours. Recent Labs  Lab 03/19/22 2017 03/19/22 2348 03/20/22 0315 03/20/22 0747 03/20/22 1125  GLUCAP 91 85 86  92 104*   Cardiac Enzymes: No results for input(s): CKTOTAL, CKMB, CKMBINDEX, TROPONINI in the last 168 hours. No results for input(s): PROBNP in the last 8760 hours. Coagulation Profile: No results for input(s): INR, PROTIME in the last 168 hours. Thyroid Function Tests: No results for input(s): TSH, T4TOTAL, FREET4, T3FREE, THYROIDAB in the last 72 hours. Lipid Profile: No results for input(s): CHOL, HDL, LDLCALC, TRIG, CHOLHDL, LDLDIRECT in the last 72 hours. Anemia Panel: No results for input(s): VITAMINB12, FOLATE, FERRITIN, TIBC, IRON, RETICCTPCT in the last 72 hours. Urine analysis:    Component Value Date/Time   COLORURINE YELLOW 03/17/2022 1652   APPEARANCEUR CLOUDY (A) 03/17/2022 1652   LABSPEC 1.010 03/17/2022 1652   PHURINE 8.0 03/17/2022 1652   GLUCOSEU NEGATIVE 03/17/2022 1652   HGBUR NEGATIVE 03/17/2022 1652   BILIRUBINUR NEGATIVE 03/17/2022 1652   KETONESUR NEGATIVE 03/17/2022 1652   PROTEINUR NEGATIVE 03/17/2022 1652   NITRITE NEGATIVE 03/17/2022 1652   LEUKOCYTESUR LARGE (A) 03/17/2022 1652   Sepsis Labs: Invalid input(s): PROCALCITONIN, [place]  Microbiology: No results found for this or any previous visit (from the past 240 hour(s)).  Radiology Studies: No results found.    Jozeph Persing T. [place]  If 7PM-7AM, please contact night-coverage [email or web address] 03/20/2022, 2:34 PM

## 2022-03-20 NOTE — Assessment & Plan Note (Addendum)
Multifactorial including acute CVA and possible Warnicke encephalopathy.  Completed course of high thiamine. She is oriented to self, follows commands and feeding herself with supervision. -Reorientation and delirium precautions -Avoid or minimize sedating medications

## 2022-03-20 NOTE — Assessment & Plan Note (Addendum)
Positive anticardiolipin IgM, lupus anticoagulant Continue Plavix.  Consider referral to hematology

## 2022-03-20 NOTE — Assessment & Plan Note (Deleted)
Tube feed discontinued

## 2022-03-20 NOTE — Assessment & Plan Note (Signed)
Resolved

## 2022-03-20 NOTE — Progress Notes (Signed)
Occupational Therapy Treatment Patient Details Name: Cindy Brooks MRN: [id number] DOB: [birth year] Today's Date: 03/20/2022   History of present illness Pt is a 51 y.o. female with history of HTN, TIA, ETOH abuse and medication noncompliance presenting to the ED after she was found by her family to be unresponsive in her car on 4/8. 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 Intubated 4/9. Tracheostormy 4/16.  Trach capped 5/16.   OT comments  STAR program OT/PT session: Addressed bed mobility with bed in flat position to simulate home environment and patient requiring mod assist +2. Bathing and dressing performed seated on EOB with min assist for UB bathing and mod assist for UB dressing, mod assist for LB bathing and mod/max assist for LB dressing. Patient transferred to wheelchair and addressed wheelchair mobility with patient demonstrating difficulty following directions to perform. Patient is a possible discharge home tomorrow with daughter. Patient to continue with STAR program.    Recommendations for follow up therapy are one component of a multi-disciplinary discharge planning process, led by the attending physician.  Recommendations may be updated based on patient status, additional functional criteria and insurance authorization.    Follow Up Recommendations  Skilled nursing-short term rehab (<3 hours/day)    Assistance Recommended at Discharge Frequent or constant Supervision/Assistance  Patient can return home with the following  Two people to help with walking and/or transfers;Two people to help with bathing/dressing/bathroom;Assistance with cooking/housework;Assistance with feeding;Direct supervision/assist for medications management;Direct supervision/assist for financial management;Assist for transportation;Help  with stairs or ramp for entrance   Equipment Recommendations  BSC/3in1;Wheelchair (measurements OT);Wheelchair cushion (measurements OT);Tub/shower bench    Recommendations for Other Services      Precautions / Restrictions Precautions Precautions: Fall Precaution Comments: PEG, stoma Restrictions Weight Bearing Restrictions: No       Mobility Bed Mobility Overal bed mobility: Needs Assistance Bed Mobility: Rolling, Sidelying to Sit Rolling: Mod assist, +2 for physical assistance, +2 for safety/equipment Sidelying to sit: Mod assist, +2 for physical assistance, +2 for safety/equipment       General bed mobility comments: addressed bed mobility with bed in more flat position to simulate home environment    Transfers Overall transfer level: Needs assistance Equipment used: Rolling walker (2 wheels), 2 person hand held assist Transfers: Sit to/from Stand, Bed to chair/wheelchair/BSC Sit to Stand: Mod assist, +2 physical assistance, +2 safety/equipment, Min assist           General transfer comment: transfer from EOB to wheelchair and wheelchair to recliner with 2 person HHA.  Performed standing with RW and mod assist x2     Balance Overall balance assessment: Needs assistance Sitting-balance support: Feet supported, No upper extremity supported Sitting balance-Leahy Scale: Fair Sitting balance - Comments: able to maintain sitting balance on EOB with min guard assist Postural control: Posterior lean Standing balance support: Bilateral upper extremity supported Standing balance-Leahy Scale: Poor Standing balance comment: reliant on UE support. cues for posture                           ADL either performed or assessed with clinical judgement   ADL Overall ADL's : Needs assistance/impaired     Grooming: Wash/dry hands;Wash/dry face;Supervision/safety   Upper Body Bathing: Minimal assistance;Sitting Upper Body Bathing Details (indicate cue type and  reason): sitting on EOB Lower Body Bathing: Moderate assistance Lower  Body Bathing Details (indicate cue type and reason): stood with assistance of 2 from EOB to bathe peri area Upper Body Dressing : Moderate assistance;Sitting Upper Body Dressing Details (indicate cue type and reason): required verbal and tactile cues and assistance to pullover head Lower Body Dressing: Moderate assistance;Maximal assistance;Sit to/from stand Lower Body Dressing Details (indicate cue type and reason): required assistance to thread legs into clothing and to pull up while standing. Patient activily particpating               General ADL Comments: bathing and dressing performed seated on EOB    Extremity/Trunk Assessment Upper Extremity Assessment RUE Sensation: WNL RUE Coordination: decreased fine motor;decreased gross motor LUE Sensation: WNL LUE Coordination: decreased gross motor;decreased fine motor            Vision       Perception     Praxis      Cognition Arousal/Alertness: Awake/alert Behavior During Therapy: Flat affect Overall Cognitive Status: Difficult to assess Area of Impairment: Problem solving, Orientation, Memory, Awareness, Safety/judgement                 Orientation Level: Disoriented to, Time Current Attention Level: Sustained Memory: Decreased short-term memory Following Commands: Follows one step commands with increased time, Follows multi-step commands with increased time Safety/Judgement: Decreased awareness of safety Awareness: Emergent Problem Solving: Slow processing, Requires verbal cues General Comments: required frequent cues to follow commands        Exercises      Shoulder Instructions       General Comments      Pertinent Vitals/ Pain       Pain Assessment Pain Assessment: Faces Faces Pain Scale: Hurts a little bit Pain Location: left knee Pain Descriptors / Indicators: Discomfort, Grimacing Pain Intervention(s): Monitored  during session, Repositioned  Home Living                                          Prior Functioning/Environment              Frequency  Min 5X/week        Progress Toward Goals  OT Goals(current goals can now be found in the care plan section)  Progress towards OT goals: Progressing toward goals  Acute Rehab OT Goals OT Goal Formulation: Patient unable to participate in goal setting Time For Goal Achievement: 03/31/22 Potential to Achieve Goals: Good ADL Goals Pt Will Perform Grooming: sitting;with min guard assist Pt Will Perform Upper Body Bathing: with min assist;sitting Pt Will Transfer to Toilet: with mod assist;stand pivot transfer;bedside commode Additional ADL Goal #1: Pt will complete bed mobility with min A as a precursor to ADLs Additional ADL Goal #2: Pt will follow simple one step commands 100% of the session to assist in ADLs  Plan Discharge plan needs to be updated    Co-evaluation    PT/OT/SLP Co-Evaluation/Treatment: Yes Reason for Co-Treatment: For patient/therapist safety;To address functional/ADL transfers   OT goals addressed during session: ADL's and self-care      AM-PAC OT "6 Clicks" Daily Activity     Outcome Measure   Help from another person eating meals?: A Little Help from another person taking care of personal grooming?: A Little Help from another person toileting, which includes using toliet, bedpan, or urinal?: A Lot Help from another person bathing (including washing, rinsing, drying)?: A Lot Help from  another person to put on and taking off regular upper body clothing?: A Lot Help from another person to put on and taking off regular lower body clothing?: A Lot 6 Click Score: 14    End of Session Equipment Utilized During Treatment: Gait belt;Rolling walker (2 wheels);Other (comment) (wheelchair)  OT Visit Diagnosis: Unsteadiness on feet (R26.81);Other abnormalities of gait and mobility (R26.89);Muscle  weakness (generalized) (M62.81);Pain Pain - Right/Left: Left Pain - part of body: Knee   Activity Tolerance Patient tolerated treatment well   Patient Left in chair;with call bell/phone within reach;with chair alarm set   Nurse Communication Mobility status        Time: [id number] OT Time Calculation (min): 65 min  Charges: OT General Charges $OT Visit: 1 Visit OT Treatments $Self Care/Home Management : 23-37 mins  Lodema Hong, [place]  Pager [phone] Office [place] 03/20/2022, 2:05 PM

## 2022-03-20 NOTE — Assessment & Plan Note (Signed)
Stable. -Recheck LFT in 1 to 2 weeks

## 2022-03-20 NOTE — Assessment & Plan Note (Signed)
S/p intubation, mechanical ventilation, tracheostomy and decannulation.  Currently on room air.  Resolved.

## 2022-03-20 NOTE — Assessment & Plan Note (Signed)
Resolved.  Completed antibiotic course.  On room air.

## 2022-03-20 NOTE — Assessment & Plan Note (Signed)
Stable. -Recheck CBC in 1 to 2 weeks

## 2022-03-20 NOTE — Progress Notes (Signed)
Physical Therapy Treatment Patient Details Name: Cindy Brooks MRN: [id number] DOB: [birth year] Today's Date: 03/20/2022   History of Present Illness Pt is a 51 y.o. female with history of HTN, TIA, ETOH abuse and medication noncompliance presenting to the ED after she was found by her family to be unresponsive in her car on 4/8. 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 Intubated 4/9. Tracheostormy 4/16.  Trach capped 5/16.    PT Comments    STAR PT/OT Session: Pt awake in bed on entry with HoB elevated, eyes wide open. Pt attempting to come to EoB, when PT stopped her and asked to put HoB down so that she could practice in a flattened bed like she will need to do at home. Pt shuts eyes and tears slowly roll out. Pt refuses/ignores cuing for bed mobility. Provided maximal encouragement and education on not moving during the day. Pt does not open eyes, but allows PT/OT to assist her in coming to EoB. Once Eob pt able to assist in bathing and dressing. Still not answering questions or talking. Able to transfer to wheelchair with maxAx2. Wheeled pt out in hallway and attempt to teach multiple propulsion techniques, however pt does not participate. Pt tearful throughout session, limited conversation at end of session one transferred to recliner. PT will follow back for final session tomorrow prior to discharge.     Recommendations for follow up therapy are one component of a multi-disciplinary discharge planning process, led by the attending physician.  Recommendations may be updated based on patient status, additional functional criteria and insurance authorization.  Follow Up Recommendations  Skilled nursing-short term rehab (<3 hours/day)     Assistance Recommended at Discharge Frequent or constant Supervision/Assistance  Patient  can return home with the following Two people to help with walking and/or transfers;Two people to help with bathing/dressing/bathroom   Equipment Recommendations  Wheelchair (measurements PT);Wheelchair cushion (measurements PT);Other (comment);Rolling walker (2 wheels);BSC/3in1       Precautions / Restrictions Precautions Precautions: Fall Precaution Comments: PEG, stoma Restrictions Weight Bearing Restrictions: No     Mobility  Bed Mobility Overal bed mobility: Needs Assistance Bed Mobility: Rolling, Sidelying to Sit Rolling: Mod assist, +2 for physical assistance, +2 for safety/equipment Sidelying to sit: Mod assist, +2 for physical assistance, +2 for safety/equipment       General bed mobility comments: addressed bed mobility with bed in more flat position to simulate home environment    Transfers Overall transfer level: Needs assistance Equipment used: Rolling walker (2 wheels), 2 person hand held assist Transfers: Sit to/from Stand, Bed to chair/wheelchair/BSC Sit to Stand: Mod assist, +2 physical assistance, +2 safety/equipment, Min assist Stand pivot transfers: Max assist, +2 physical assistance, Total assist         General transfer comment: transfer from EOB to wheelchair and wheelchair to recliner with 2 person HHA mod Ax2 to wheelchair and maxAx2 Performed standing with RW and mod assist x2    Ambulation/Gait Ambulation/Gait assistance: Max assist Gait Distance (Feet): 2 Feet Assistive device: Rolling walker (2 wheels) Gait Pattern/deviations: Step-through pattern, Knee flexed in stance - right, Knee flexed in stance - left, Trunk flexed, Narrow base of support Gait velocity: slowed Gait velocity interpretation: <1.31 ft/sec, indicative of household ambulator   General Gait Details: continues to have difficulty with fully upright positioning for ambulation which is limiting her ability to lift and progress  LE.       Modified Rankin (Stroke Patients  Only) Modified Rankin (Stroke Patients Only) Pre-Morbid Rankin Score: No symptoms Modified Rankin: Severe disability     Balance Overall balance assessment: Needs assistance Sitting-balance support: Feet supported, No upper extremity supported Sitting balance-Leahy Scale: Fair Sitting balance - Comments: able to maintain sitting balance on EOB with min guard assist Postural control: Posterior lean Standing balance support: Bilateral upper extremity supported Standing balance-Leahy Scale: Poor Standing balance comment: reliant on UE support. cues for posture                            Cognition Arousal/Alertness: Awake/alert Behavior During Therapy: Flat affect Overall Cognitive Status: Difficult to assess Area of Impairment: Problem solving, Orientation, Memory, Awareness, Safety/judgement                 Orientation Level: Disoriented to, Time Current Attention Level: Sustained Memory: Decreased short-term memory Following Commands: Follows one step commands with increased time, Follows multi-step commands with increased time Safety/Judgement: Decreased awareness of safety Awareness: Emergent Problem Solving: Slow processing, Requires verbal cues General Comments: required frequent cues to follow commands           General Comments General comments (skin integrity, edema, etc.): VSS on RA      Pertinent Vitals/Pain Pain Assessment Pain Assessment: Faces Faces Pain Scale: Hurts a little bit Pain Location: left knee Pain Descriptors / Indicators: Discomfort, Grimacing Pain Intervention(s): Limited activity within patient's tolerance, Monitored during session, Repositioned     PT Goals (current goals can now be found in the care plan section) Acute Rehab PT Goals PT Goal Formulation: With patient Time For Goal Achievement: 03/25/22 Potential to Achieve Goals: Good Progress towards PT goals: Progressing toward goals    Frequency    Min  5X/week      PT Plan Current plan remains appropriate    Co-evaluation PT/OT/SLP Co-Evaluation/Treatment: Yes Reason for Co-Treatment: For patient/therapist safety PT goals addressed during session: Mobility/safety with mobility OT goals addressed during session: ADL's and self-care      AM-PAC PT "6 Clicks" Mobility   Outcome Measure  Help needed turning from your back to your side while in a flat bed without using bedrails?: A Lot Help needed moving from lying on your back to sitting on the side of a flat bed without using bedrails?: A Lot Help needed moving to and from a bed to a chair (including a wheelchair)?: Total Help needed standing up from a chair using your arms (e.g., wheelchair or bedside chair)?: Total Help needed to walk in hospital room?: Total Help needed climbing 3-5 steps with a railing? : Total 6 Click Score: 8    End of Session Equipment Utilized During Treatment: Gait belt Activity Tolerance: Patient tolerated treatment well Patient left: in chair;with call bell/phone within reach;with chair alarm set Nurse Communication: Mobility status;Patient requests pain meds PT Visit Diagnosis: Other abnormalities of gait and mobility (R26.89);Other symptoms and signs involving the nervous system (R29.898)     Time: 2706-2376 PT Time Calculation (min) (ACUTE ONLY): 55 min  Charges:  $Gait Training: 8-22 mins $Wheel Chair Management: 8-22 mins                     Etienne Mowers B. Beverely Risen PT, DPT Acute Rehabilitation Services Please use secure chat or  Call Office [phone]    [place] Al[place] 03/20/2022, 2:42 PM

## 2022-03-20 NOTE — Assessment & Plan Note (Signed)
Now normotensive. -Continue amlodipine, Inderal and hydralazine.

## 2022-03-20 NOTE — Assessment & Plan Note (Signed)
Failed voiding trial and Foley catheter reinserted -Outpatient follow-up with urology for another voiding trial

## 2022-03-21 DIAGNOSIS — E872 Acidosis, unspecified: Secondary | ICD-10-CM

## 2022-03-21 LAB — GLUCOSE, CAPILLARY
Glucose-Capillary: 100 mg/dL — ABNORMAL HIGH (ref 70–99)
Glucose-Capillary: 121 mg/dL — ABNORMAL HIGH (ref 70–99)

## 2022-03-21 MED ORDER — POLYETHYLENE GLYCOL 3350 17 GM/SCOOP PO POWD: 17.0000 g | Freq: Two times a day (BID) | ORAL | 0 refills | Status: DC | PRN

## 2022-03-21 MED ORDER — AMANTADINE HCL 100 MG PO CAPS
100.0000 mg | ORAL_CAPSULE | Freq: Two times a day (BID) | ORAL | 1 refills | Status: DC
Start: 2022-03-21 — End: 2022-09-02

## 2022-03-21 MED ORDER — GABAPENTIN 100 MG PO CAPS: 100.0000 mg | ORAL_CAPSULE | Freq: Two times a day (BID) | ORAL | 1 refills | Status: DC

## 2022-03-21 MED ORDER — SENNOSIDES-DOCUSATE SODIUM 8.6-50 MG PO TABS: 1.0000 | ORAL_TABLET | Freq: Two times a day (BID) | ORAL | 0 refills | Status: AC | PRN

## 2022-03-21 MED ORDER — FOLIC ACID 1 MG PO TABS: 1.0000 mg | ORAL_TABLET | Freq: Every day | ORAL | 1 refills | Status: DC

## 2022-03-21 MED ORDER — GERHARDT'S BUTT CREAM: 1.0000 "application " | TOPICAL_CREAM | Freq: Every day | CUTANEOUS | Status: AC | PRN

## 2022-03-21 MED ORDER — FREE WATER: 50.0000 mL | Freq: Four times a day (QID) | Status: DC

## 2022-03-21 MED ORDER — ENSURE ENLIVE PO LIQD: 237.0000 mL | Freq: Three times a day (TID) | ORAL | 0 refills | Status: DC

## 2022-03-21 MED ORDER — ADULT MULTIVITAMIN W/MINERALS CH: 1.0000 | ORAL_TABLET | Freq: Every day | ORAL | Status: AC

## 2022-03-21 MED ORDER — IPRATROPIUM-ALBUTEROL 0.5-2.5 (3) MG/3ML IN SOLN: 3.0000 mL | Freq: Four times a day (QID) | RESPIRATORY_TRACT | 1 refills | Status: AC | PRN

## 2022-03-21 MED ORDER — GLYCOPYRROLATE 1 MG PO TABS: 0.5000 mg | ORAL_TABLET | Freq: Two times a day (BID) | ORAL | 0 refills | Status: DC

## 2022-03-21 MED ORDER — THIAMINE HCL 100 MG PO TABS: 100.0000 mg | ORAL_TABLET | Freq: Every day | ORAL | 1 refills | Status: DC

## 2022-03-21 MED ORDER — AMLODIPINE BESYLATE 10 MG PO TABS: 10.0000 mg | ORAL_TABLET | Freq: Every day | ORAL | 1 refills | Status: DC

## 2022-03-21 MED ORDER — ACETAMINOPHEN 325 MG PO TABS: 650.0000 mg | ORAL_TABLET | ORAL | 2 refills | Status: AC | PRN

## 2022-03-21 MED ORDER — PROPRANOLOL HCL 40 MG PO TABS: 40.0000 mg | ORAL_TABLET | Freq: Three times a day (TID) | ORAL | 0 refills | Status: DC

## 2022-03-21 MED ORDER — CLOPIDOGREL BISULFATE 75 MG PO TABS: 75.0000 mg | ORAL_TABLET | Freq: Every day | ORAL | 1 refills | Status: DC

## 2022-03-21 MED ORDER — HYDRALAZINE HCL 50 MG PO TABS: 50.0000 mg | ORAL_TABLET | Freq: Three times a day (TID) | ORAL | 1 refills | Status: DC

## 2022-03-21 MED ORDER — ATORVASTATIN CALCIUM 80 MG PO TABS: 80.0000 mg | ORAL_TABLET | Freq: Every day | ORAL | 1 refills | Status: DC

## 2022-03-21 NOTE — Progress Notes (Signed)
Pt discharged home. Pt's sister states that she was previously educated on pt's care. Family also opted to purchase pt's DME needs on [employer]. All belongings packed up this am and sent home with sister. No PIV. Pt to go home with PEG and foley. [facility] arrived to pick up pt at 1309.  Robina Ade, RN

## 2022-03-21 NOTE — Progress Notes (Addendum)
Physical Therapy Treatment Patient Details Name: Cindy Brooks MRN: [id number] DOB: 11-Jun-1971 Today's Date: 03/21/2022   History of Present Illness Pt is a 51 y.o. female with history of HTN, TIA, ETOH abuse and medication noncompliance presenting to the ED after she was found by her family to be unresponsive in her car on 4/8. 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 Intubated 4/9. Tracheostormy 4/16.  Trach capped 5/16.    PT Comments    STAR PT/OT session: Pt up in recliner talking with fluid speech to daughter on entry. Once therapy enters pt with limited verbal response to interaction. Pt with no clothes available for discharge so daughter left to buy some with hopes of returning before end of session so that family education on dressing could take place. Pt with increased L knee pain today. Placed ace wrap to provide more support to attempt walking with therapy. Pt is mod A for standing and total A for stepping x1 in RW due to L knee pain. Pt provided education and practiced leg strengthening exercise to decrease OA pain in knee with weightbearing. Asked pt if there were any other activities she would like to trouble shoot prior to discharge however none noted.  Daughter did not return in time for dressing training.    Recommendations for follow up therapy are one component of a multi-disciplinary discharge planning process, led by the attending physician.  Recommendations may be updated based on patient status, additional functional criteria and insurance authorization.  Follow Up Recommendations  Skilled nursing-short term rehab (<3 hours/day)     Assistance Recommended at Discharge Frequent or constant Supervision/Assistance  Patient can return home with the following Two people to help with walking and/or  transfers;Two people to help with bathing/dressing/bathroom   Equipment Recommendations  Wheelchair (measurements PT);Wheelchair cushion (measurements PT);Other (comment);Rolling walker (2 wheels);BSC/3in1       Precautions / Restrictions Precautions Precautions: Fall Precaution Comments: PEG, stoma Restrictions Weight Bearing Restrictions: No     Mobility  Bed Mobility Overal bed mobility: Needs Assistance             General bed mobility comments: up in recliner on entry    Transfers Overall transfer level: Needs assistance Equipment used: Rolling walker (2 wheels), 2 person hand held assist Transfers: Sit to/from Stand, Bed to chair/wheelchair/BSC Sit to Stand: Mod assist           General transfer comment: modA for power up and steadying    Ambulation/Gait Ambulation/Gait assistance: Max assist Gait Distance (Feet): 1 Feet Assistive device: Rolling walker (2 wheels) Gait Pattern/deviations: Step-through pattern, Knee flexed in stance - right, Knee flexed in stance - left, Trunk flexed, Narrow base of support       General Gait Details: pt unable to take steps due to L knee pain      Modified Rankin (Stroke Patients Only) Modified Rankin (Stroke Patients Only) Pre-Morbid Rankin Score: No symptoms Modified Rankin: Severe disability     Balance Overall balance assessment: Needs assistance Sitting-balance support: Feet supported, No upper extremity supported Sitting balance-Leahy Scale: Fair Sitting balance - Comments: able to maintain sitting balance on EOB with min guard assist Postural control: Posterior lean Standing balance support: Bilateral upper extremity supported Standing balance-Leahy Scale: Poor Standing balance comment: reliant on UE support. cues for posture  Cognition Arousal/Alertness: Awake/alert Behavior During Therapy: Flat affect Overall Cognitive Status: Difficult to assess Area of  Impairment: Problem solving, Orientation, Memory, Awareness, Safety/judgement                 Orientation Level: Disoriented to, Time Current Attention Level: Sustained Memory: Decreased short-term memory Following Commands: Follows one step commands with increased time, Follows multi-step commands with increased time Safety/Judgement: Decreased awareness of safety Awareness: Emergent Problem Solving: Slow processing, Requires verbal cues General Comments: required frequent cues to follow commands        Exercises General Exercises - Upper Extremity Shoulder Flexion: AAROM, Both, 10 reps, Seated Shoulder ABduction: AAROM, Both, 10 reps, Seated General Exercises - Lower Extremity Ankle Circles/Pumps: AROM, Both, 10 reps, Seated Gluteal Sets: AROM, Both, 10 reps, Seated Long Arc Quad: AROM, Right, 10 reps, Seated Hip ABduction/ADduction: AROM, Both, 10 reps, Seated Hip Flexion/Marching: AROM, Both, 10 reps    General Comments General comments (skin integrity, edema, etc.): daughter present at beginning of session but left to get clothes for discharge home      Pertinent Vitals/Pain Pain Assessment Pain Assessment: Faces Faces Pain Scale: Hurts even more Pain Location: left knee with weightbearing Pain Descriptors / Indicators: Discomfort, Grimacing Pain Intervention(s): Limited activity within patient's tolerance, Monitored during session, Repositioned     PT Goals (current goals can now be found in the care plan section) Acute Rehab PT Goals PT Goal Formulation: With patient Time For Goal Achievement: 03/25/22 Potential to Achieve Goals: Good Progress towards PT goals: Progressing toward goals (some limitation from L knee pain)    Frequency    Min 5X/week      PT Plan Current plan remains appropriate    Co-evaluation PT/OT/SLP Co-Evaluation/Treatment: Yes Reason for Co-Treatment: For patient/therapist safety PT goals addressed during session:  Mobility/safety with mobility OT goals addressed during session: ADL's and self-care      AM-PAC PT "6 Clicks" Mobility   Outcome Measure  Help needed turning from your back to your side while in a flat bed without using bedrails?: A Lot Help needed moving from lying on your back to sitting on the side of a flat bed without using bedrails?: A Lot Help needed moving to and from a bed to a chair (including a wheelchair)?: Total Help needed standing up from a chair using your arms (e.g., wheelchair or bedside chair)?: Total Help needed to walk in hospital room?: Total Help needed climbing 3-5 steps with a railing? : Total 6 Click Score: 8    End of Session Equipment Utilized During Treatment: Gait belt Activity Tolerance: Patient tolerated treatment well Patient left: in chair;with call bell/phone within reach;with chair alarm set Nurse Communication: Mobility status;Patient requests pain meds PT Visit Diagnosis: Other abnormalities of gait and mobility (R26.89);Other symptoms and signs involving the nervous system [id number])     Time: [id number] PT Time Calculation (min) (ACUTE ONLY): 54 min  Charges:  $Therapeutic Exercise: 8-22 mins $Therapeutic Activity: 8-22 mins                     Delmas Faucett B. Migdalia Dk PT, DPT Acute Rehabilitation Services Please use secure chat or  Call Office [phone]    [place] 03/21/2022, 3:57 PM

## 2022-03-21 NOTE — Progress Notes (Signed)
Occupational Therapy Treatment Patient Details Name: Cindy Brooks MRN: [id number] DOB: [birth year] Today's Date: 03/21/2022   History of present illness Pt is a 51 y.o. female with history of HTN, TIA, ETOH abuse and medication noncompliance presenting to the ED after she was found by her family to be unresponsive in her car on 4/8. 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 Intubated 4/9. Tracheostormy 4/16.  Trach capped 5/16.   OT comments  [facility] program OT/PT session: Patient up in recliner with daughter present. Patient's daughter had to leave to get clothing for patient to wear for discharge home. Daughter did not return during session to address dressing. Patient performed stand from recliner with mod assist +2 but was unable to progress with steps due to LLE knee pain. Patient performed grooming seated in recliner with assistance to load toothbrush.  AAROM exercises performed to BUE shoulder and instructions on shoulder shrugs and retraction exercises. Patient is expected to discharge home with family today.    Recommendations for follow up therapy are one component of a multi-disciplinary discharge planning process, led by the attending physician.  Recommendations may be updated based on patient status, additional functional criteria and insurance authorization.    Follow Up Recommendations  Skilled nursing-short term rehab (<3 hours/day)    Assistance Recommended at Discharge Frequent or constant Supervision/Assistance  Patient can return home with the following  Two people to help with walking and/or transfers;Two people to help with bathing/dressing/bathroom;Assistance with cooking/housework;Assistance with feeding;Direct supervision/assist for medications management;Direct supervision/assist for financial  management;Assist for transportation;Help with stairs or ramp for entrance   Equipment Recommendations  BSC/3in1;Wheelchair (measurements OT);Wheelchair cushion (measurements OT);Tub/shower bench    Recommendations for Other Services      Precautions / Restrictions Precautions Precautions: Fall Precaution Comments: PEG, stoma Restrictions Weight Bearing Restrictions: No       Mobility Bed Mobility Overal bed mobility: Needs Assistance             General bed mobility comments: up in recliner on entry    Transfers Overall transfer level: Needs assistance Equipment used: Rolling walker (2 wheels), 2 person hand held assist Transfers: Sit to/from Stand, Bed to chair/wheelchair/BSC Sit to Stand: Mod assist           General transfer comment: modA for power up and steadying     Balance Overall balance assessment: Needs assistance Sitting-balance support: Feet supported, No upper extremity supported Sitting balance-Leahy Scale: Fair     Standing balance support: Bilateral upper extremity supported Standing balance-Leahy Scale: Poor Standing balance comment: reliant on UE support. cues for posture                           ADL either performed or assessed with clinical judgement   ADL Overall ADL's : Needs assistance/impaired     Grooming: Wash/dry hands;Wash/dry face;Oral care;Minimal assistance;Sitting Grooming Details (indicate cue type and reason): required min assist with toothpaste                               General ADL Comments: performed grooming seated in recliner    Extremity/Trunk Assessment              Vision       Perception     Praxis  Cognition Arousal/Alertness: Awake/alert Behavior During Therapy: Flat affect Overall Cognitive Status: Difficult to assess Area of Impairment: Problem solving, Orientation, Memory, Awareness, Safety/judgement                 Orientation Level: Disoriented  to, Time Current Attention Level: Sustained Memory: Decreased short-term memory Following Commands: Follows one step commands with increased time, Follows multi-step commands with increased time Safety/Judgement: Decreased awareness of safety Awareness: Emergent Problem Solving: Slow processing, Requires verbal cues General Comments: required frequent cues to follow commands        Exercises Exercises: General Upper Extremity General Exercises - Upper Extremity Shoulder Flexion: AAROM, Both, 10 reps, Seated Shoulder ABduction: AAROM, Both, 10 reps, Seated    Shoulder Instructions       General Comments daughter present at beginning of session but left to get clothes for discharge home    Pertinent Vitals/ Pain       Pain Assessment Pain Assessment: Faces Faces Pain Scale: Hurts even more Pain Location: left knee with weightbearing Pain Descriptors / Indicators: Discomfort, Grimacing Pain Intervention(s): Limited activity within patient's tolerance, Repositioned  Home Living                                          Prior Functioning/Environment              Frequency  Min 5X/week        Progress Toward Goals  OT Goals(current goals can now be found in the care plan section)  Progress towards OT goals: Progressing toward goals  Acute Rehab OT Goals OT Goal Formulation: Patient unable to participate in goal setting Time For Goal Achievement: 03/31/22 Potential to Achieve Goals: Good ADL Goals Pt Will Perform Grooming: sitting;with min guard assist Pt Will Perform Upper Body Bathing: with min assist;sitting Pt Will Transfer to Toilet: with mod assist;stand pivot transfer;bedside commode Additional ADL Goal #1: Pt will complete bed mobility with min A as a precursor to ADLs Additional ADL Goal #2: Pt will follow simple one step commands 100% of the session to assist in ADLs  Plan Discharge plan remains appropriate    Co-evaluation     PT/OT/SLP Co-Evaluation/Treatment: Yes Reason for Co-Treatment: For patient/therapist safety;To address functional/ADL transfers   OT goals addressed during session: ADL's and self-care      AM-PAC OT "6 Clicks" Daily Activity     Outcome Measure   Help from another person eating meals?: A Little Help from another person taking care of personal grooming?: A Little Help from another person toileting, which includes using toliet, bedpan, or urinal?: A Lot Help from another person bathing (including washing, rinsing, drying)?: A Lot Help from another person to put on and taking off regular upper body clothing?: A Lot Help from another person to put on and taking off regular lower body clothing?: A Lot 6 Click Score: 14    End of Session Equipment Utilized During Treatment: Gait belt;Rolling walker (2 wheels)  OT Visit Diagnosis: Unsteadiness on feet (R26.81);Other abnormalities of gait and mobility (R26.89);Muscle weakness (generalized) (M62.81);Pain Pain - Right/Left: Left Pain - part of body: Knee   Activity Tolerance Patient tolerated treatment well   Patient Left in chair;with call bell/phone within reach;with chair alarm set   Nurse Communication Mobility status        Time: [id number] OT Time Calculation (min): 54 min  Charges: OT General Charges $  OT Visit: 1 Visit OT Treatments $Self Care/Home Management : 8-22 mins $Therapeutic Exercise: 8-22 mins  Lodema Hong, OTA Acute Rehabilitation Services  Pager [phone] Office [phone]   Trixie Dredge 03/21/2022, 2:34 PM

## 2022-03-21 NOTE — TOC Progression Note (Signed)
Transition of Care [facility]) - Progression Note    Patient Details  Name: Cindy Brooks MRN: [id number] Date of Birth: [birth year]  [facility] CM/SW Contact  Janae Bridgeman, RN Phone Number: 03/21/2022, 8:08 AM  Clinical Narrative:    CM called [facility] services and the patient has confirmed discharged and pick up from the hospital to home at 1 pm today.  The patient's daughter is aware of discharge to home today and will transport the patient's belongings home this morning.  Bedside nursing to give discharge instructions to the patient's daughter for care.  PCP - Patient has scheduled follow up with [facility] and appointment is scheduled in the AVS.  Home Health - [facility] will be providing home health services and orders are noted in the chart.  Discharge packet will be placed with the unit secretary to include Medical necessity, face-sheet and AVS discharge packet.  CM and MSW with DTP Team will continue to follow the patient for discharge to home today.   Expected Discharge Plan: Home w Home Health Services Barriers to Discharge: Inadequate or no insurance  Expected Discharge Plan and Services Expected Discharge Plan: Home w Home Health Services In-house Referral: PCP / [facility] Discharge Planning Services: CM Consult, Follow-up appt scheduled, Medication Assistance Post Acute Care Choice: Home Health Living arrangements for the past 2 months: Single Family Home Expected Discharge Date: 03/21/22                         HH Arranged: PT   Date HH Agency Contacted: 03/13/22 Time HH Agency Contacted: 0901 Representative spoke with at [facility] Agency: [facility] ([facility])   Social Determinants of Health (SDOH) Interventions    Readmission Risk Interventions    03/13/2022    9:03 AM  Readmission Risk Prevention Plan  Transportation Screening Complete  PCP or Specialist Appt within 5-7 Days Complete  Home Care  Screening Complete  Medication Review (RN CM) Complete

## 2022-03-21 NOTE — Discharge Summary (Addendum)
Physician Discharge Summary  Ferrel Turco [id number] DOB: August 23, 1971 DOA: 01/20/2022  PCP: Pcp, No  Admit date: 01/20/2022 Discharge date: 03/21/2022 Admitted From: Home Disposition: Home Recommendations for Outpatient Follow-up:  Follow ups as below. Please obtain CBC/BMP/Mag at follow up Please follow up on the following pending results: None  Home Health: PT/OT/RN Equipment/Devices: Rolling walker, bedside commode, light wheelchair, hospital bed  Discharge Condition: Stable CODE STATUS: Full code  Follow-up Information     [facility]. Schedule an appointment as soon as possible for a visit in 1 month(s).   Specialty: Neurology Why: stroke clinic Contact information: [place]. Schedule an appointment as soon as possible for a visit.   Why: You are scheduled for a clinic visit for a hospital follow up on April 17, 2022 at 9:30 am with Dr. Archie Patten. Contact information: [place] [phone] [place], [place] Follow up.   Why: [facility] will be providing home health services at the home.  They will call to arrange an initial assessment visit within 24-48 hours of discharge from the hospital. Contact information: [place] [address] [phone]                 Hospital course 51 year old F with PMH of HTN and TIA brought to ED after found unresponsive  by family member on 01/20/2022.  On arrival, she was obtunded with copious secretion requiring intubation and mechanical ventilation.  MRI brain concerning for bilateral thalamic, midbrain, multiple punctate acute to early subacute bilateral cerebral infarct, multiple scattered chronic microhemorrhages about the deep gray nuclei and cerebellum and underlying  chronic microvascular ischemic disease.  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  TTE without significant finding.  There was also concern about Wernick encephalopathy.  She was started on high-dose thiamine.  Spot and LTM EEG without seizure or epileptiform discharge.  Not able to liberate from vent.  She had trach placed on 4/17 and PEG placed on 4/18.  Eventually, respiratory status improved.  She was decannulated on 5/19.  She was also upgraded to dysphagia 2 diet and tube feed discontinued.  Overall, some improvement in mental status.  Therapy recommended SNF but daughter to take patient home with home health and DME.  Hospital course complicated by HCAP and urinary retention.  She completed antibiotic course for HCAP.  She is discharged with indwelling Foley catheter after she failed voiding trial.  May consider outpatient follow-up with urology for voiding trial.   See individual problem list below for more on hospital course.  Problems addressed during this hospitalization Problem  Acute bilateral thalamic, midbrain and bilateral cerebral ischemic CVA  Acute Metabolic Encephalopathy  Dysphagia with PEG tube  Acute Urinary Retention  Antiphospholipid Antibody With Hypercoagulable State (Hcc)  Normocytic Anemia  PEG  status (HCC)  Uncontrolled Hypertension  Elevated Lfts (Resolved)  Hcap (Healthcare-Associated Pneumonia) (Resolved)  Hypernatremia (Resolved)  Fever (Resolved)  Leukocytosis (Resolved)  Sympathetic Storming (Resolved)  Acute Respiratory Failure With Hypoxia (Hcc) (Resolved)    Assessment and Plan: * Acute bilateral thalamic, midbrain and bilateral cerebral ischemic CVA Noted on MRI brain.  Likely due  to uncontrolled hypertension and intracranial stenosis.  Also some concern about phospholipid antibody with hypercoagulable state.  Imaging  including MRI, CT and echocardiogram as above. -Continue Plavix Crestor and amantadine. -Continue PT/OT -Outpatient follow-up with neurology  Acute metabolic encephalopathy Multifactorial including acute CVA and possible Warnicke encephalopathy.  Completed course of high thiamine. She is oriented to self, follows commands and feeding herself with supervision. -Reorientation and delirium precautions -Avoid or minimize sedating medications  Acute urinary retention Failed voiding trial and Foley catheter reinserted -Outpatient follow-up with urology for another voiding trial  Dysphagia with PEG tube Dysphagia improved.  SLP recommended dysphagia 2 diet.  Tube feed discontinued.  Antiphospholipid antibody with hypercoagulable state ([place]) Positive anticardiolipin IgM, lupus anticoagulant Continue Plavix.  Consider referral to hematology  Normocytic anemia Stable. -Recheck CBC in 1 to 2 weeks  Uncontrolled hypertension Now normotensive. -Continue amlodipine, Inderal and hydralazine.  Elevated LFTs-resolved as of 03/21/2022 Stable. -Recheck LFT in 1 to 2 weeks  HCAP (healthcare-associated pneumonia)-resolved as of 03/21/2022 Resolved.  Completed antibiotic course.  On room air.  Hypernatremia-resolved as of 03/21/2022 Resolved.  Acute respiratory failure with hypoxia (HCC)-resolved as of 03/21/2022 S/p intubation, mechanical ventilation, tracheostomy and decannulation.  Currently on room air.  Resolved.   Vital signs Vitals:   03/21/22 0340 03/21/22 0500 03/21/22 0802 03/21/22 1132  BP: 137/89  (!) 142/79 (!) 147/85  Pulse: 89  87 99  Temp: 98.6 F (37 C)  99 F (37.2 C) 99.6 F (37.6 C)  Resp: 16  16 18   Height:      Weight:  79.2 kg    SpO2: 100%  97% 97%  TempSrc: Axillary  Oral Oral  BMI (Calculated):         Discharge exam  GENERAL: No apparent distress.  Nontoxic. HEENT: MMM.  Vision and hearing grossly intact.  NECK: Supple.  No apparent JVD.  RESP:  No IWOB.   Fair aeration bilaterally. CVS:  RRR. Heart sounds normal.  ABD/GI/GU: BS+. Abd soft, NTND.  MSK/EXT:  Moves extremities. No apparent deformity.  Trace: Extremity edema. SKIN: no apparent skin lesion or wound NEURO: Awake.  Oriented to self.  Follows commands.  Feeding himself.  Left ptosis. PSYCH: Calm. Normal affect.   Discharge Instructions Discharge Instructions     Call MD for:  difficulty breathing, headache or visual disturbances   Complete by: As directed    Call MD for:  extreme fatigue   Complete by: As directed    Call MD for:  persistant dizziness or light-headedness   Complete by: As directed    Call MD for:  persistant nausea and vomiting   Complete by: As directed    Call MD for:  severe uncontrolled pain   Complete by: As directed    Call MD for:  temperature >100.4   Complete by: As directed    Diet - low sodium heart healthy   Complete by: As directed    Diet recommendations: Dysphagia 2 (fine chop);Thin liquid Liquids provided via: Cup;Straw  Oral Care Recommendations: Oral care BID;Staff/trained caregiver to provide oral care Assistance recommended at discharge: Frequent or constant Supervision/Assistance SLP Visit Diagnosis: Cognitive communication deficit [id number]) Plan: Continue with current plan of care   Discharge instructions   Complete by: As directed    It has been a pleasure taking care of you!  You were hospitalized due to stroke and respiratory failure.  You have been treated and improved to the point we think it is safe to let  you go home and follow-up with your doctors.  Follow-up with your primary care doctor in 1 to 2 weeks.  Follow-up with neurology in 4 to 6 weeks.  Take your medications as prescribed.  Review your new medication list and the directions on your medications before you take them.   Take care,   Discharge wound care:   Complete by: As directed    Apply Gerhardt Butt cream twice daily Closely monitor   For home use only DME  Nebulizer machine   Complete by: As directed    Patient needs a nebulizer to treat with the following condition: Shortness of breath   Length of Need: 6 Months   Increase activity slowly   Complete by: As directed       Allergies as of 03/21/2022       Reactions   Aspirin Hives, Rash   Codeine Hives, Rash   Penicillins Anaphylaxis, Swelling, Other (See Comments)   Strawberry Extract Anaphylaxis, Hives        Medication List     TAKE these medications    acetaminophen 325 MG tablet Commonly known as: Tylenol Take 2 tablets (650 mg total) by mouth every 4 (four) hours as needed.   amantadine 100 MG capsule Commonly known as: SYMMETREL Take 1 capsule (100 mg total) by mouth 2 (two) times daily.   amLODipine 10 MG tablet Commonly known as: NORVASC Take 1 tablet (10 mg total) by mouth daily.   atorvastatin 80 MG tablet Commonly known as: LIPITOR Take 1 tablet (80 mg total) by mouth daily.   clopidogrel 75 MG tablet Commonly known as: PLAVIX Take 1 tablet (75 mg total) by mouth daily.   feeding supplement Liqd Take 237 mLs by mouth 3 (three) times daily between meals.   folic acid 1 MG tablet Commonly known as: FOLVITE Take 1 tablet (1 mg total) by mouth daily.   gabapentin 100 MG capsule Commonly known as: NEURONTIN Take 1 capsule (100 mg total) by mouth 2 (two) times daily.   Gerhardt's butt cream Crea Apply 1 application. topically daily as needed for irritation.   glycopyrrolate 1 MG tablet Commonly known as: ROBINUL Take 0.5 tablets (0.5 mg total) by mouth 2 (two) times daily.   hydrALAZINE 50 MG tablet Commonly known as: APRESOLINE Take 1 tablet (50 mg total) by mouth every 8 (eight) hours.   ipratropium-albuterol 0.5-2.5 (3) MG/3ML Soln Commonly known as: DUONEB Take 3 mLs by nebulization every 6 (six) hours as needed.   multivitamin with minerals Tabs tablet Take 1 tablet by mouth daily.   polyethylene glycol powder 17 GM/SCOOP  powder Commonly known as: MiraLax Take 17 g by mouth 2 (two) times daily as needed for moderate constipation.   propranolol 40 MG tablet Commonly known as: INDERAL Take 1 tablet (40 mg total) by mouth 3 (three) times daily.   senna-docusate 8.6-50 MG tablet Commonly known as: Senokot-S Take 1 tablet by mouth 2 (two) times daily between meals as needed for mild constipation.   thiamine 100 MG tablet Take 1 tablet (100 mg total) by mouth daily.               Durable Medical Equipment  (From admission, onward)           Start     Ordered   03/21/22 0000  For home use only DME Nebulizer machine       Question Answer Comment  Patient needs a nebulizer to treat with the following condition Shortness  of breath   Length of Need 6 Months      03/21/22 0744   03/13/22 0933  For home use only DME 3 n 1  Once        03/13/22 0932   03/08/22 0805  For home use only DME lightweight manual wheelchair with seat cushion  Once       Comments: Patient suffers from S/P thalamic stroke which impairs their ability to perform daily activities like bathing, dressing, and toileting in the home.  A walker will not resolve  issue with performing activities of daily living. A wheelchair will allow patient to safely perform daily activities. Patient is not able to propel themselves in the home using a standard weight wheelchair due to general weakness. Patient can self propel in the lightweight wheelchair. Length of need 12 months . Accessories: elevating leg rests (ELRs), wheel locks, extensions and anti-tippers.   03/08/22 0807   03/08/22 0803  For home use only DME Hospital bed  Once       Question Answer Comment  Length of Need 12 Months   The above medical condition requires: Patient requires the ability to reposition frequently   Head must be elevated greater than: 30 degrees   Bed type Semi-electric   Support Surface: Gel Overlay      03/08/22 0803              Discharge Care  Instructions  (From admission, onward)           Start     Ordered   03/21/22 0000  Discharge wound care:       Comments: Apply Gerhardt Butt cream twice daily Closely monitor   03/21/22 0744            Consultations: Neurology Pulmonology General surgery  Procedures/Studies: 4/9-intubation 4/17-trach 4/18-PEG 5/19-decannulation  DG Knee Left Port  Result Date: 03/12/2022 CLINICAL DATA:  Left knee pain. EXAM: PORTABLE LEFT KNEE - 1-2 VIEW COMPARISON:  None Available. FINDINGS: Significant age advanced tricompartmental degenerative changes most notable involving the medial compartment. There is joint space narrowing, osteophytic spurring and subchondral cystic change. No acute bony findings.  Very small joint effusion. 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 No acute bony findings. Electronically Signed   By: Marijo Sanes M.D.   On: 03/12/2022 13:04   DG Swallowing Func-Speech Pathology  Result Date: 02/28/2022 Table formatting from the original result was not included. Objective Swallowing Evaluation: Type of Study: MBS-Modified Barium Swallow Study  Patient Details Name: Cindy Brooks MRN: [id number] Date of Birth: 1971/06/28 Today's Date: 02/28/2022 Time: SLP Start Time (ACUTE ONLY): [id number] -SLP Stop Time (ACUTE ONLY): [id number] SLP Time Calculation (min) (ACUTE ONLY): 30 min Past Medical History: Past Medical History: Diagnosis Date  Hypertension   Hypertensive urgency 01/20/2022  Seizure ([place]) 01/28/2022  TIA (transient ischemic attack) 2019  Urinary tract infection 01/28/2022 Past Surgical History: Past Surgical History: Procedure Laterality Date  ESOPHAGOGASTRODUODENOSCOPY N/A 01/28/2022  Procedure: ESOPHAGOGASTRODUODENOSCOPY (EGD);  Surgeon: Jesusita Oka, MD;  Location: [facility] ENDOSCOPY;  Service: General;  Laterality: N/A;  PEG PLACEMENT N/A 01/28/2022  Procedure: PERCUTANEOUS ENDOSCOPIC GASTROSTOMY (PEG) PLACEMENT;  Surgeon: Jesusita Oka, MD;   Location: [facility] ENDOSCOPY;  Service: General;  Laterality: N/A; HPI: Pt is a 51 y/o female who presented to the ED after being found unresponsive in her care. Pt admitted 4/9 with thalamic stroke, treated for possible acute metabolic and wernicke's encphalopathy as well. She required intubation for airway protection on admission and  had a tracheostomy placed on 4/16.  PEG on 4/18. MRI 4/19: Multiple foci of restricted diffusion in the bilateral frontal lobes and parietal lobes primarily. Started tolerating ATC on 4/22. Has suffered from severe encephalopathy, neurostorming. Started following commands around 5/15. A time of evaluation, pt with a 4 cuffless trach which is capped and may be decannulated within the next 24-48 hours. PMH: HTN, TIA, ETOH abuse and medication noncompliance.  No data recorded  Recommendations for follow up therapy are one component of a multi-disciplinary discharge planning process, led by the attending physician.  Recommendations may be updated based on patient status, additional functional criteria and insurance authorization. Assessment / Plan / Recommendation   02/28/2022  12:38 PM Clinical Impressions Clinical Impression Pt was lethargic throughout the study despite sternal rub and thermotactile stimulation with cold washcloth, and pt exhibited difficulty maintaining an adequate level of alertness once improved alertness was achieved. SLP suspects that this, combined with the flavor the barium, likely impacted pt's performance during the study. However, the study was somewhat limited due to pt's lethargy. She exhibited worse bolus awareness than has been exhibited at bedside, and significant oral holding was noted with purees and thin liquids. Oral suctioning was necessary once due to the extent of oral holding. Minimal premature spillage to the valleculae was noted with a thin liquid bolus via straw; however, pt was otherwise able to contain large liquid boluses in the oral cavity with no  instances of premature spillage even when a posterior head tilt was facilitated. With verbal and tactile prompts and cues, once the swallow was triggered, pharyngeal clearance was WNL and no instances of penetration/aspiration were demonstrated. Considering pt's current lethargy, diet initiation will be deferred today. However, once pt's presentation is more similar to that noted this morning in treatment, a dysphagia 1 diet with thin liquids will likely be initiated. SLP Visit Diagnosis Dysphagia, oral phase (R13.11) Impact on safety and function Mild aspiration risk     02/28/2022  12:38 PM Treatment Recommendations Treatment Recommendations Therapy as outlined in treatment plan below     02/28/2022  12:38 PM Prognosis Prognosis for Safe Diet Advancement Good Barriers to Reach Goals Cognitive deficits;Language deficits;Time post onset   02/28/2022  12:38 PM Diet Recommendations SLP Diet Recommendations Dysphagia 1 (Puree) solids;Thin liquid Liquid Administration via Cup;Straw Medication Administration Via alternative means Compensations Slow rate;Small sips/bites;Minimize environmental distractions Postural Changes Seated upright at 90 degrees     02/28/2022  12:38 PM Other Recommendations Oral Care Recommendations Oral care BID Follow Up Recommendations Skilled nursing-short term rehab (<3 hours/day) Assistance recommended at discharge Frequent or constant Supervision/Assistance Functional Status Assessment Patient has had a recent decline in their functional status and demonstrates the ability to make significant improvements in function in a reasonable and predictable amount of time.   02/28/2022  12:38 PM Frequency and Duration  Speech Therapy Frequency (ACUTE ONLY) min 2x/week Treatment Duration 2 weeks     02/28/2022  12:38 PM Oral Phase Oral Phase Impaired Oral - Thin Cup Delayed oral transit;Holding of bolus Oral - Thin Straw Delayed oral transit;Holding of bolus Oral - Puree Delayed oral transit;Holding of  bolus    02/28/2022  12:38 PM Pharyngeal Phase Pharyngeal Phase [facility]    02/28/2022  12:38 PM Cervical Esophageal Phase  Cervical Esophageal Phase [facility] Shanika I. Hardin Negus, [place], [place] Office number [phone] Pager [place] 02/28/2022, 1:51 PM  DG CHEST PORT 1 VIEW  Result Date: 02/20/2022 CLINICAL DATA:  Pneumonia EXAM: PORTABLE CHEST 1 VIEW COMPARISON:  02/15/2022 FINDINGS: Tracheostomy remains in good position. Bibasilar airspace disease left greater than right unchanged. No significant effusion or edema. IMPRESSION: Bibasilar airspace disease left greater than right unchanged. No new findings. Electronically Signed   By: Franchot Gallo M.D.   On: 02/20/2022 10:15       The results of significant diagnostics from this hospitalization (including imaging, microbiology, ancillary and laboratory) are listed below for reference.     Microbiology: No results found for this or any previous visit (from the past 240 hour(s)).   Labs:  CBC: Recent Labs  Lab 03/16/22 1057  WBC 6.0  NEUTROABS 3.1  HGB 11.2*  HCT 33.7*  MCV 82.0  PLT 291   BMP &GFR Recent Labs  Lab 03/16/22 1057  NA 137  K 4.1  CL 104  CO2 22  GLUCOSE 98  BUN 8  CREATININE 0.69  CALCIUM 9.7   Estimated Creatinine Clearance: 85.7 mL/min (by C-G formula based on SCr of 0.69 mg/dL). Liver & Pancreas: Recent Labs  Lab 03/16/22 1057  AST 70*  ALT 184*  ALKPHOS 107  BILITOT 0.7  PROT 7.5  ALBUMIN 3.5   No results for input(s): "LIPASE", "AMYLASE" in the last 168 hours. No results for input(s): "AMMONIA" in the last 168 hours. Diabetic: No results for input(s): "HGBA1C" in the last 72 hours. Recent Labs  Lab 03/20/22 1543 03/20/22 1934 03/20/22 2329 03/21/22 0759 03/21/22 1130  GLUCAP 100* 110* 124* 100* 121*   Cardiac Enzymes: No results for input(s): "CKTOTAL", "CKMB", "CKMBINDEX", "TROPONINI" in the last 168 hours. No results  for input(s): "PROBNP" in the last 8760 hours. Coagulation Profile: No results for input(s): "INR", "PROTIME" in the last 168 hours. Thyroid Function Tests: No results for input(s): "TSH", "T4TOTAL", "FREET4", "T3FREE", "THYROIDAB" in the last 72 hours. Lipid Profile: No results for input(s): "CHOL", "HDL", "LDLCALC", "TRIG", "CHOLHDL", "LDLDIRECT" in the last 72 hours. Anemia Panel: No results for input(s): "VITAMINB12", "FOLATE", "FERRITIN", "TIBC", "IRON", "RETICCTPCT" in the last 72 hours. Urine analysis:    Component Value Date/Time   COLORURINE YELLOW 03/17/2022 1652   APPEARANCEUR CLOUDY (A) 03/17/2022 1652   LABSPEC 1.010 03/17/2022 1652   PHURINE 8.0 03/17/2022 1652   GLUCOSEU NEGATIVE 03/17/2022 1652   HGBUR NEGATIVE 03/17/2022 1652   BILIRUBINUR NEGATIVE 03/17/2022 1652   KETONESUR NEGATIVE 03/17/2022 1652   PROTEINUR NEGATIVE 03/17/2022 1652   NITRITE NEGATIVE 03/17/2022 1652   LEUKOCYTESUR LARGE (A) 03/17/2022 1652   Sepsis Labs: Invalid input(s): "PROCALCITONIN", "LACTICIDVEN"   Time coordinating discharge: 45 minutes  SIGNED:  Mercy Riding, MD  [facility] 03/21/2022, 12:18 PM

## 2022-03-21 NOTE — Progress Notes (Signed)
Speech Language Pathology Treatment: Dysphagia;Cognitive-Linquistic  Patient Details Name: Cindy Brooks MRN: [id number] DOB: December 04, 1970 Today's Date: 03/21/2022 Time: [id number] SLP Time Calculation (min) (ACUTE ONLY): 30 min  Assessment / Plan / Recommendation Clinical Impression  Pt needed quite a bit of stimulation to wake up, but once meal set up, pt began initiating self feeding in an unconventional, but safe manner. She wanted to place the plate on her lap so towel were placed on her. She poured her oatmeal all over her eggs and sausage which did serve to moisten her food. She was able to self feed with decreased oral holding in this manner. Question cues still needed to elicit expression of wants and needs. Pt requires a great deal of assist with verbal problem solving, but is at least now initiating and demonstrating basic functional problem solving and some awareness of physical deficits. Pt making excellent progress.   HPI HPI: Pt is a 51 y/o female who presented to the ED after being found unresponsive in her care. Pt admitted 4/9 with thalamic stroke, treated for possible acute metabolic and wernicke's encphalopathy as well. She required intubation for airway protection on admission and had a tracheostomy placed on 4/16.  PEG on 4/18.Decannulated 5/19. MRI 4/19: Multiple foci of restricted diffusion in the bilateral frontal lobes and parietal lobes primarily.  Has suffered from severe encephalopathy, neurostorming. Started following commands around 5/15. PMH: HTN, TIA, ETOH abuse and medication noncompliance; ST f/u for cog/linguistic and dysphagia needs in acute setting.      SLP Plan  Continue with current plan of care      Recommendations for follow up therapy are one component of a multi-disciplinary discharge planning process, led by the attending physician.  Recommendations may be updated based on patient status, additional functional criteria and insurance authorization.     Recommendations  Diet recommendations: Dysphagia 2 (fine chop);Thin liquid Liquids provided via: Cup;Straw Medication Administration: Crushed with puree Supervision: Full supervision/cueing for compensatory strategies Compensations: Slow rate;Small sips/bites;Minimize environmental distractions Postural Changes and/or Swallow Maneuvers: Seated upright 90 degrees                Oral Care Recommendations: Oral care BID Follow Up Recommendations: Home health SLP Assistance recommended at discharge: Frequent or constant Supervision/Assistance SLP Visit Diagnosis: Cognitive communication deficit [id number]) Plan: Continue with current plan of care           Hayes Rehfeldt, Katherene Ponto  03/21/2022, 8:47 AM

## 2022-03-23 ENCOUNTER — Other Ambulatory Visit: Payer: Self-pay

## 2022-03-23 ENCOUNTER — Emergency Department (HOSPITAL_COMMUNITY): Payer: 59

## 2022-03-23 ENCOUNTER — Inpatient Hospital Stay (HOSPITAL_COMMUNITY)
Admission: EM | Admit: 2022-03-23 | Discharge: 2022-03-26 | DRG: 689 | Disposition: A | Discharge status: Home Health | Payer: 59 | Attending: Internal Medicine | Admitting: Internal Medicine

## 2022-03-23 ENCOUNTER — Encounter (HOSPITAL_COMMUNITY): Payer: Self-pay

## 2022-03-23 DIAGNOSIS — R4701 Aphasia: Secondary | ICD-10-CM | POA: Diagnosis not present

## 2022-03-23 DIAGNOSIS — Z931 Gastrostomy status: Secondary | ICD-10-CM

## 2022-03-23 DIAGNOSIS — R1310 Dysphagia, unspecified: Secondary | ICD-10-CM | POA: Diagnosis present

## 2022-03-23 DIAGNOSIS — I10 Essential (primary) hypertension: Secondary | ICD-10-CM | POA: Diagnosis present

## 2022-03-23 DIAGNOSIS — I69320 Aphasia following cerebral infarction: Secondary | ICD-10-CM | POA: Diagnosis present

## 2022-03-23 DIAGNOSIS — R000 Tachycardia, unspecified: Secondary | ICD-10-CM | POA: Diagnosis present

## 2022-03-23 DIAGNOSIS — R338 Other retention of urine: Secondary | ICD-10-CM | POA: Diagnosis present

## 2022-03-23 DIAGNOSIS — Z7902 Long term (current) use of antithrombotics/antiplatelets: Secondary | ICD-10-CM

## 2022-03-23 DIAGNOSIS — Z886 Allergy status to analgesic agent status: Secondary | ICD-10-CM

## 2022-03-23 DIAGNOSIS — N390 Urinary tract infection, site not specified: Secondary | ICD-10-CM | POA: Diagnosis not present

## 2022-03-23 DIAGNOSIS — Z9102 Food additives allergy status: Secondary | ICD-10-CM

## 2022-03-23 DIAGNOSIS — B964 Proteus (mirabilis) (morganii) as the cause of diseases classified elsewhere: Secondary | ICD-10-CM | POA: Diagnosis present

## 2022-03-23 DIAGNOSIS — Z91148 Patient's other noncompliance with medication regimen for other reason: Secondary | ICD-10-CM

## 2022-03-23 DIAGNOSIS — Z885 Allergy status to narcotic agent status: Secondary | ICD-10-CM

## 2022-03-23 DIAGNOSIS — F1010 Alcohol abuse, uncomplicated: Secondary | ICD-10-CM | POA: Diagnosis present

## 2022-03-23 DIAGNOSIS — D6862 Lupus anticoagulant syndrome: Secondary | ICD-10-CM | POA: Diagnosis present

## 2022-03-23 DIAGNOSIS — I639 Cerebral infarction, unspecified: Secondary | ICD-10-CM | POA: Diagnosis present

## 2022-03-23 DIAGNOSIS — Z8673 Personal history of transient ischemic attack (TIA), and cerebral infarction without residual deficits: Secondary | ICD-10-CM

## 2022-03-23 DIAGNOSIS — G9341 Metabolic encephalopathy: Secondary | ICD-10-CM | POA: Diagnosis present

## 2022-03-23 DIAGNOSIS — Z880 Allergy status to penicillin: Secondary | ICD-10-CM

## 2022-03-23 DIAGNOSIS — I63513 Cerebral infarction due to unspecified occlusion or stenosis of bilateral middle cerebral arteries: Secondary | ICD-10-CM

## 2022-03-23 DIAGNOSIS — I69391 Dysphagia following cerebral infarction: Secondary | ICD-10-CM

## 2022-03-23 DIAGNOSIS — D6861 Antiphospholipid syndrome: Secondary | ICD-10-CM | POA: Diagnosis present

## 2022-03-23 DIAGNOSIS — Z79899 Other long term (current) drug therapy: Secondary | ICD-10-CM

## 2022-03-23 LAB — COMPREHENSIVE METABOLIC PANEL
ALT: 72 U/L — ABNORMAL HIGH (ref 0–44)
AST: 36 U/L (ref 15–41)
Albumin: 3.9 g/dL (ref 3.5–5.0)
Alkaline Phosphatase: 109 U/L (ref 38–126)
Anion gap: 14 (ref 5–15)
BUN: 11 mg/dL (ref 6–20)
CO2: 21 mmol/L — ABNORMAL LOW (ref 22–32)
Calcium: 9.9 mg/dL (ref 8.9–10.3)
Chloride: 107 mmol/L (ref 98–111)
Creatinine, Ser: 0.96 mg/dL (ref 0.44–1.00)
GFR, Estimated: >60 mL/min (ref >60)
Glucose, Bld: 92 mg/dL (ref 70–99)
Potassium: 3.6 mmol/L (ref 3.5–5.1)
Sodium: 142 mmol/L (ref 135–145)
Total Bilirubin: 0.9 mg/dL (ref 0.3–1.2)
Total Protein: 8 g/dL (ref 6.5–8.1)

## 2022-03-23 LAB — CBC WITH DIFFERENTIAL/PLATELET
Abs Immature Granulocytes: 0.04 10*3/uL (ref 0.00–0.07)
Basophils Absolute: 0 10*3/uL (ref 0.0–0.1)
Basophils Relative: 0 %
Eosinophils Absolute: 0.1 10*3/uL (ref 0.0–0.5)
Eosinophils Relative: 1 %
HCT: 36.3 % (ref 36.0–46.0)
Hemoglobin: 12 g/dL (ref 12.0–15.0)
Immature Granulocytes: 0 %
Lymphocytes Relative: 22 %
Lymphs Abs: 2 10*3/uL (ref 0.7–4.0)
MCH: 26.7 pg (ref 26.0–34.0)
MCHC: 33.1 g/dL (ref 30.0–36.0)
MCV: 80.7 fL (ref 80.0–100.0)
Monocytes Absolute: 0.8 10*3/uL (ref 0.1–1.0)
Monocytes Relative: 9 %
Neutro Abs: 6.4 10*3/uL (ref 1.7–7.7)
Neutrophils Relative %: 68 %
Platelets: 373 10*3/uL (ref 150–400)
RBC: 4.5 MIL/uL (ref 3.87–5.11)
RDW: 15.4 % (ref 11.5–15.5)
WBC: 9.3 10*3/uL (ref 4.0–10.5)
nRBC: 0 % (ref 0.0–0.2)

## 2022-03-23 LAB — URINALYSIS, ROUTINE W REFLEX MICROSCOPIC
Bilirubin Urine: NEGATIVE
Glucose, UA: NEGATIVE mg/dL
Hgb urine dipstick: NEGATIVE
Ketones, ur: 20 mg/dL — AB
Nitrite: POSITIVE — AB
Protein, ur: 100 mg/dL — AB
Specific Gravity, Urine: 1.016 (ref 1.005–1.030)
WBC, UA: >50 WBC/hpf — ABNORMAL HIGH (ref 0–5)
pH: 9 — ABNORMAL HIGH (ref 5.0–8.0)

## 2022-03-23 LAB — D-DIMER, QUANTITATIVE: D-Dimer, Quant: 1.53 ug/mL-FEU — ABNORMAL HIGH (ref 0.00–0.50)

## 2022-03-23 LAB — RAPID URINE DRUG SCREEN, HOSP PERFORMED
Amphetamines: NOT DETECTED
Barbiturates: NOT DETECTED
Benzodiazepines: NOT DETECTED
Cocaine: NOT DETECTED
Opiates: NOT DETECTED
Tetrahydrocannabinol: NOT DETECTED

## 2022-03-23 LAB — PROTIME-INR
INR: 1.2 (ref 0.8–1.2)
Prothrombin Time: 15.1 seconds (ref 11.4–15.2)

## 2022-03-23 LAB — TROPONIN I (HIGH SENSITIVITY)
Troponin I (High Sensitivity): 22 ng/L — ABNORMAL HIGH (ref <18)
Troponin I (High Sensitivity): 22 ng/L — ABNORMAL HIGH (ref <18)

## 2022-03-23 LAB — LACTIC ACID, PLASMA: Lactic Acid, Venous: 1.2 mmol/L (ref 0.5–1.9)

## 2022-03-23 LAB — T4, FREE: Free T4: 1.13 ng/dL — ABNORMAL HIGH (ref 0.61–1.12)

## 2022-03-23 LAB — MAGNESIUM: Magnesium: 2.1 mg/dL (ref 1.7–2.4)

## 2022-03-23 IMAGING — DX DG CHEST 1V PORT
1 series · 1 of 1 positions shown · non-contrast
Comparison: [DATE]

CLINICAL DATA: Tachycardia

EXAM:
PORTABLE CHEST 1 VIEW

[chest]
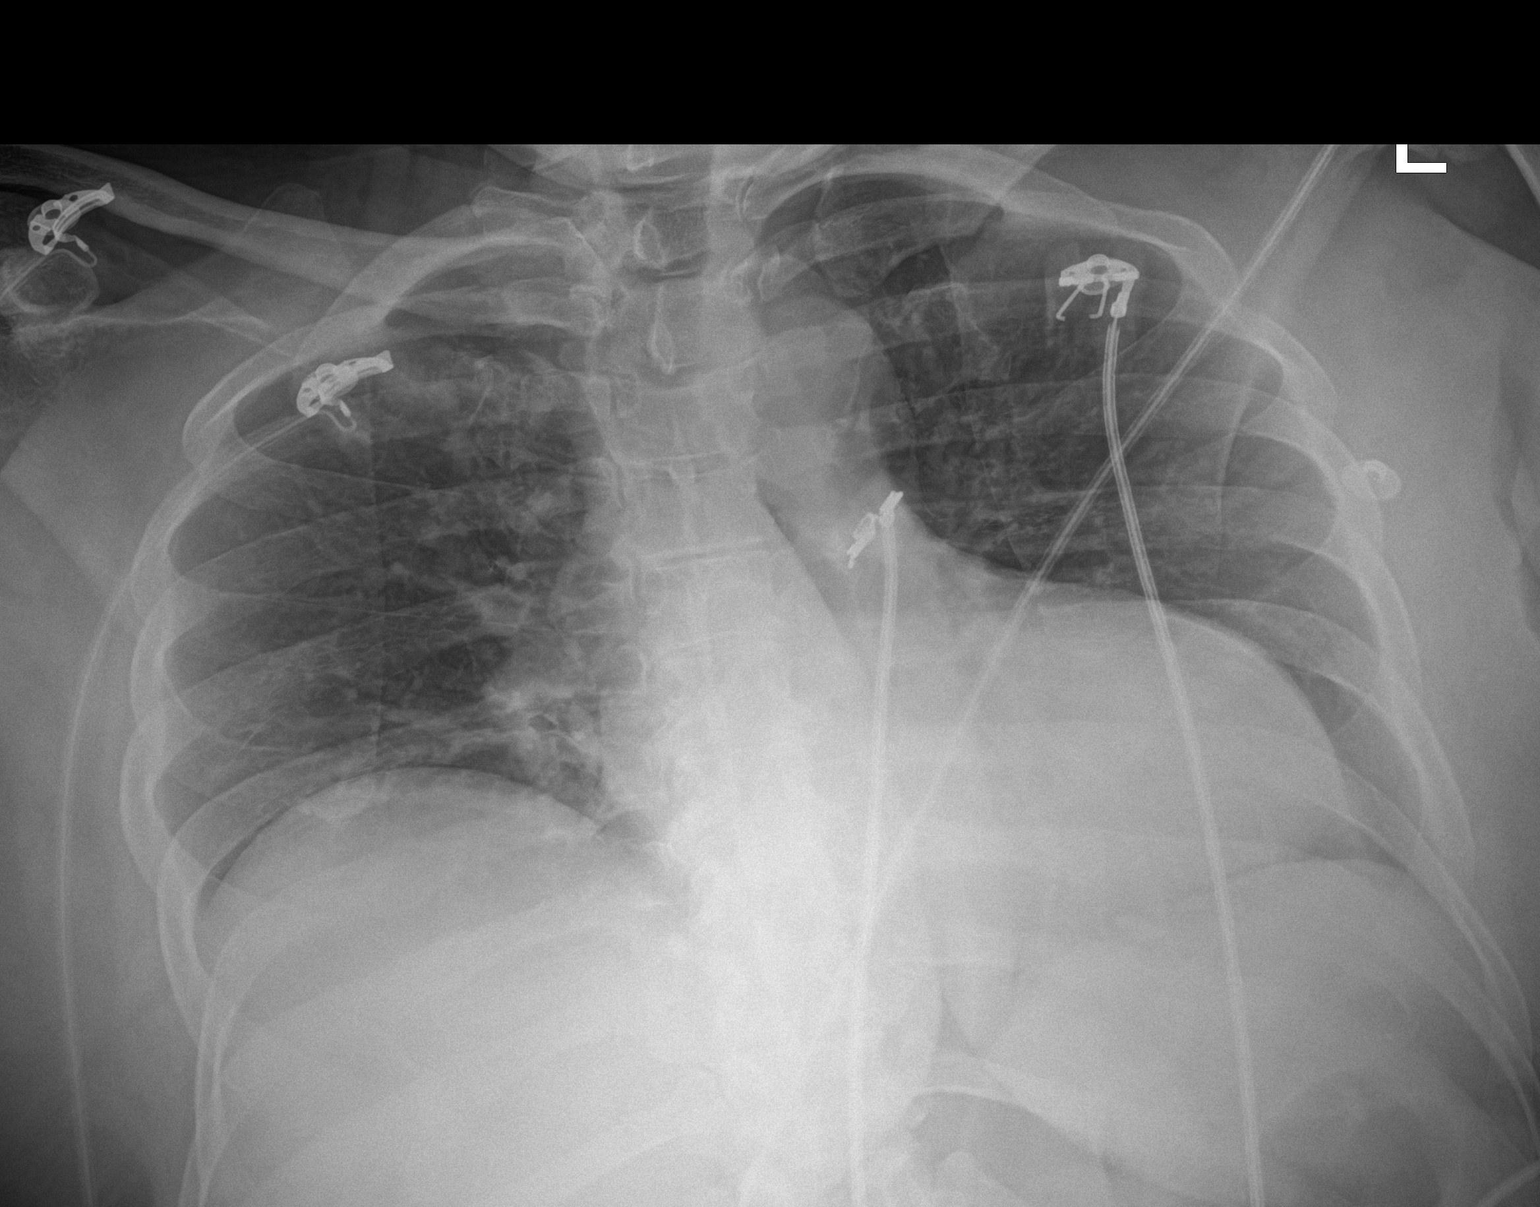

[1 of 1 positions shown; findings below may reference images not displayed]

FINDINGS: Cardiomegaly. Both lungs are clear. The visualized skeletal
structures are unremarkable.
IMPRESSION: Cardiomegaly without acute abnormality of the lungs in AP portable
projection.

## 2022-03-23 IMAGING — CT CT HEAD W/O CM
4 series · 16 of 47 positions shown, 18 images · non-contrast
Comparison: Previous studies including MR brain done on [DATE]
and CT done on [DATE]

CLINICAL DATA: Neurological deficit



[Series 2: head wo · axial · 0.32mm/px · z∈[-183,-70]mm · 6 of 33 slices shown, 8 images]
[im 5/33  brain]
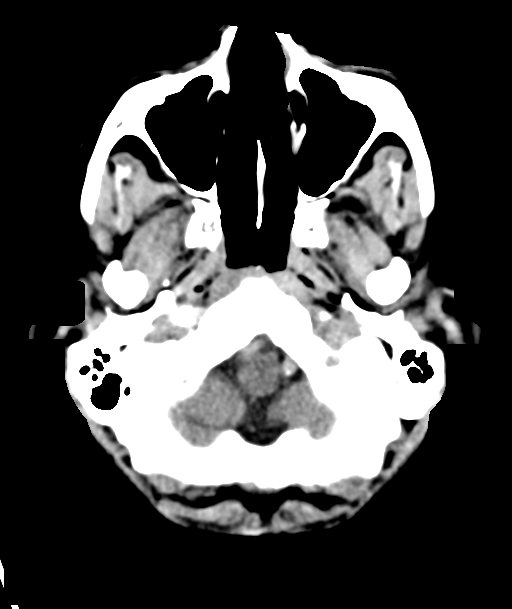
[im 5/33  bone]
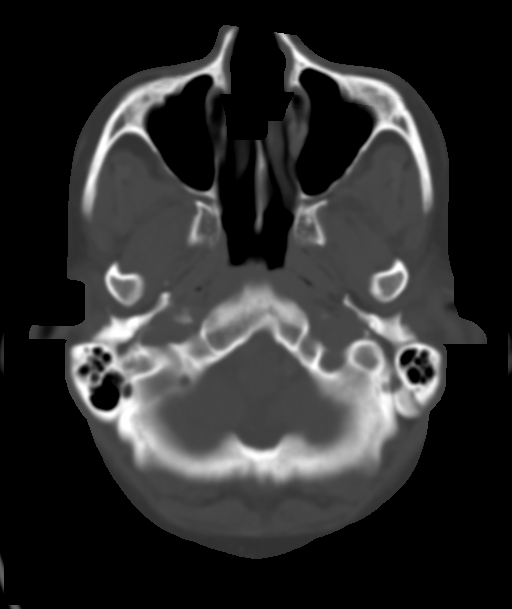
[im 10/33  brain]
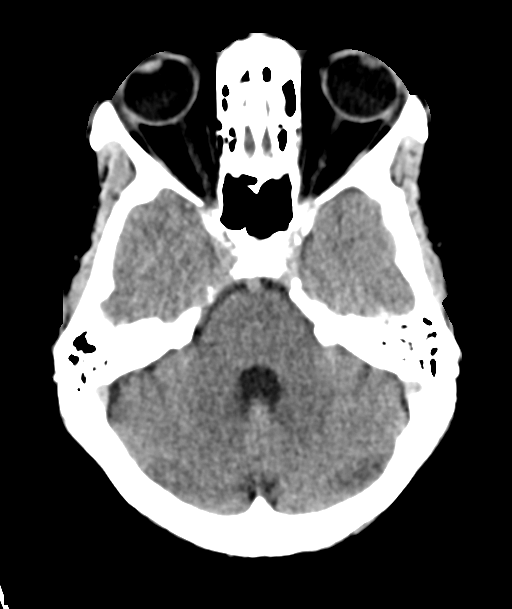
[im 14/33  brain]
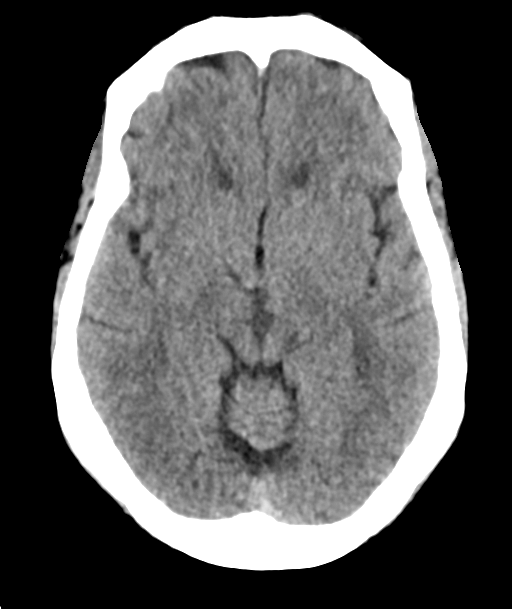
[im 19/33  brain]
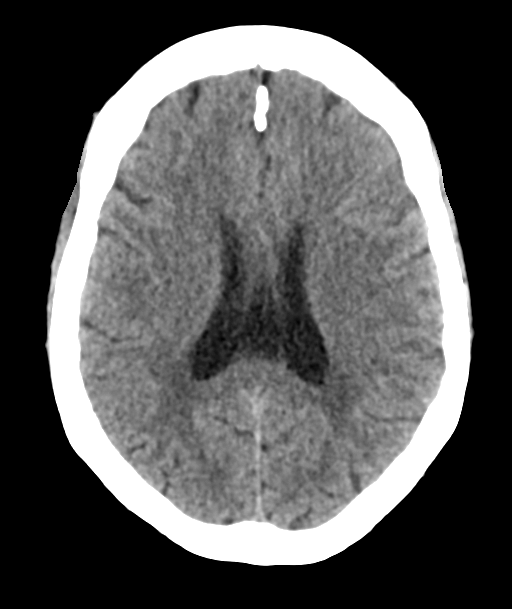
[im 23/33  brain]
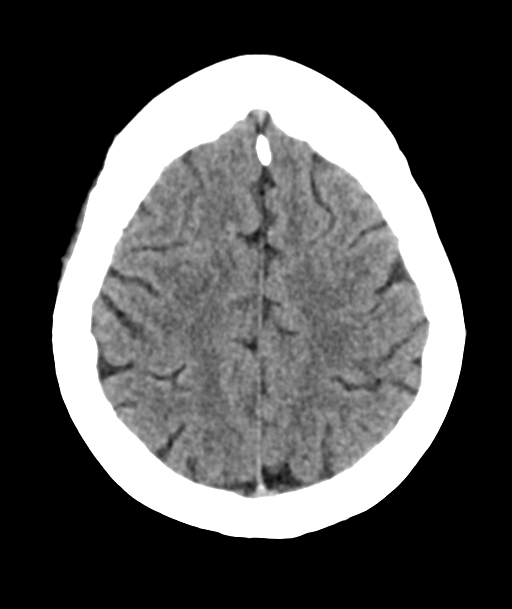
[im 23/33  bone]
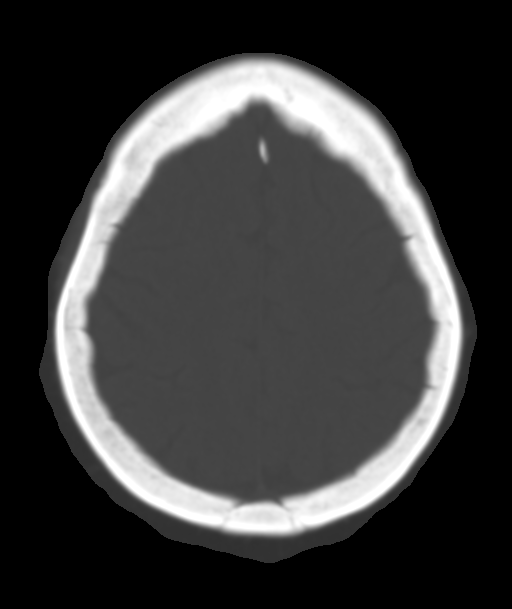
[im 28/33  brain]
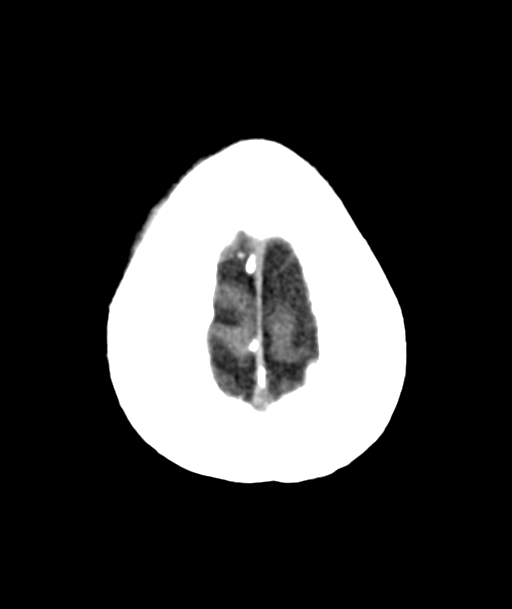

[Series 3: head bone · axial · 0.41mm/px · z∈[-165,-105]mm · 4 of 90 slices shown]
[im 9/90  bone]
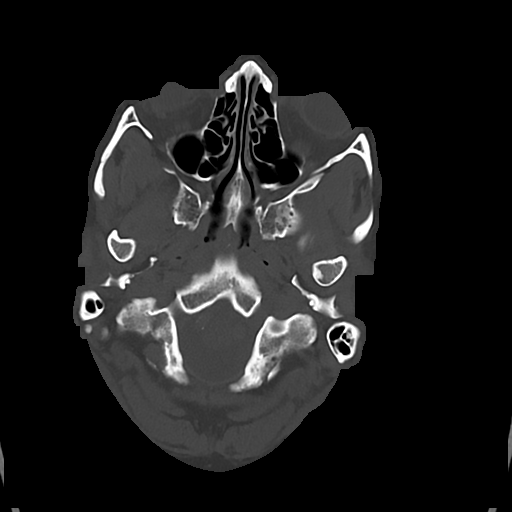
[im 17/90  bone]
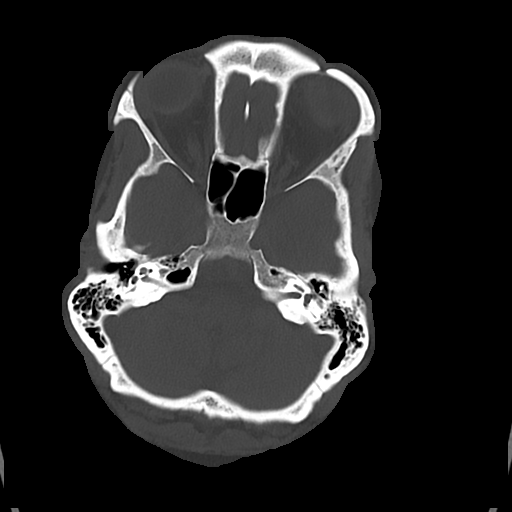
[im 30/90  bone]
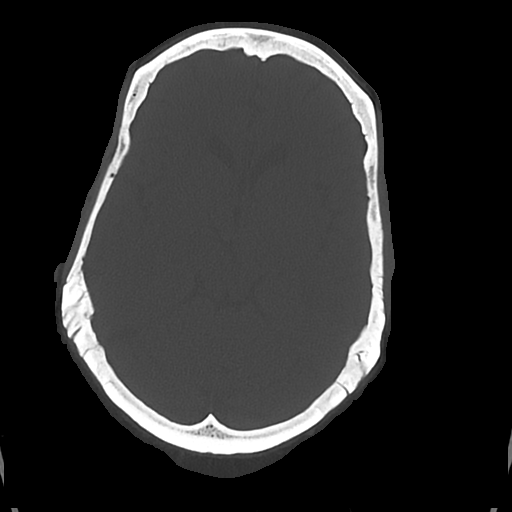
[im 39/90  bone]
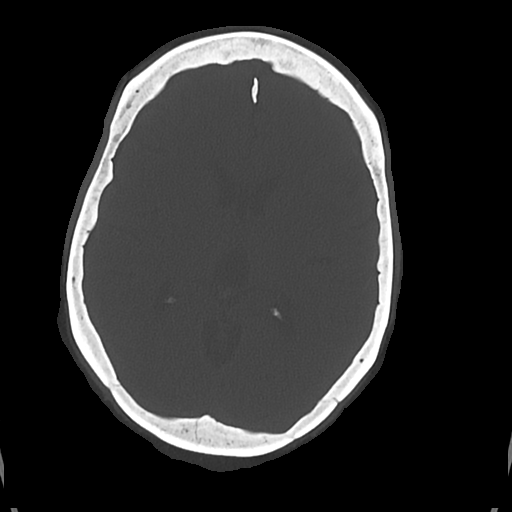

[Series 4: cor soft · coronal · 0.32mm/px · 3 of 66 slices shown]
[im 22/66  brain]
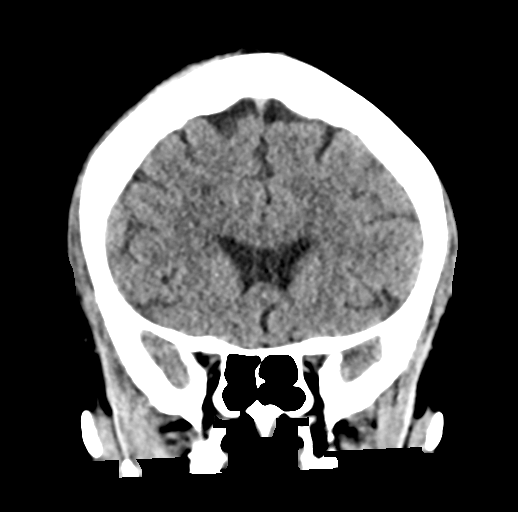
[im 29/66  brain]
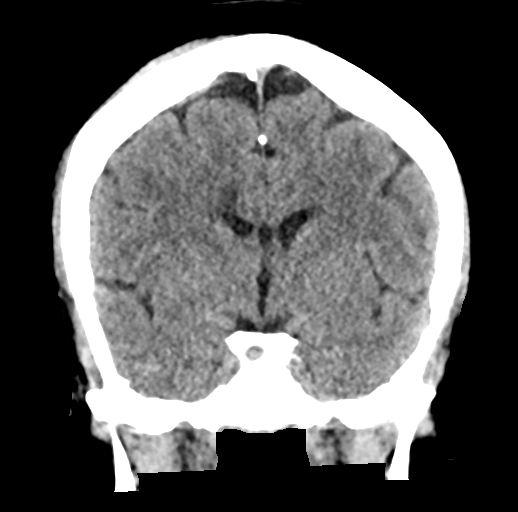
[im 37/66  brain]
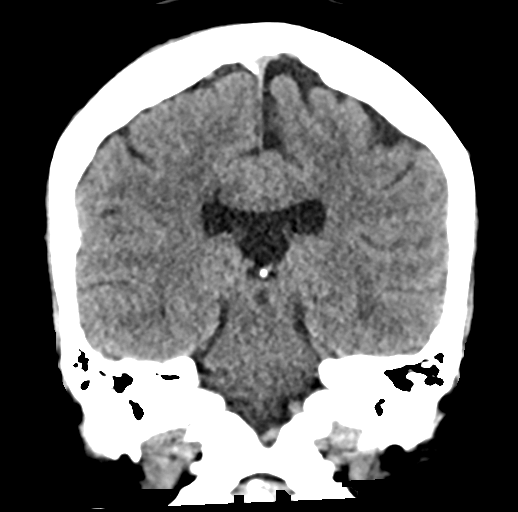

[Series 5: sag soft · sagittal · 0.32mm/px · 3 of 54 slices shown]
[im 18/54  brain]
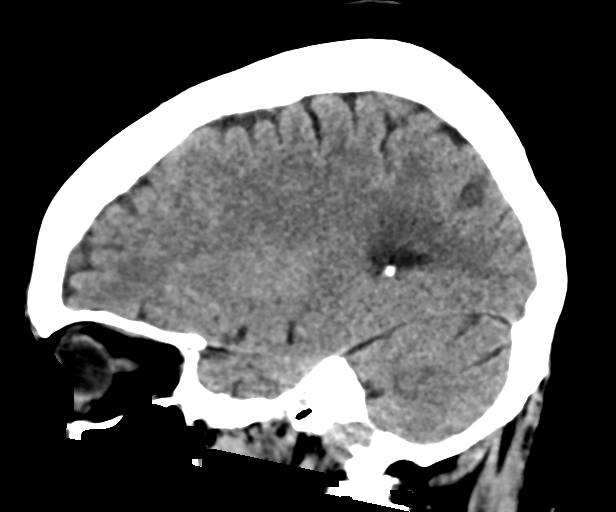
[im 27/54  brain]
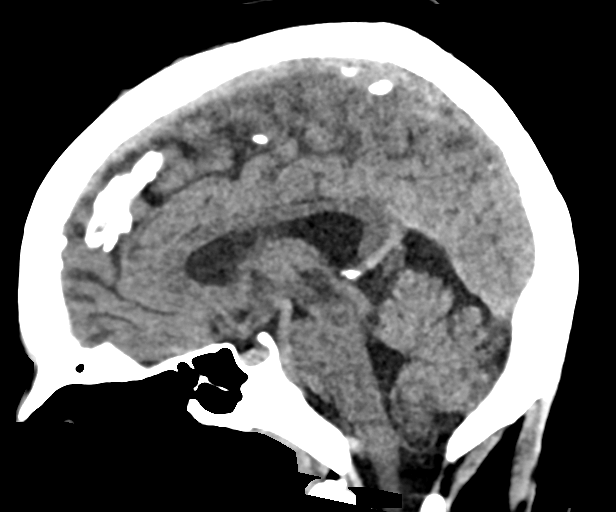
[im 36/54  brain]
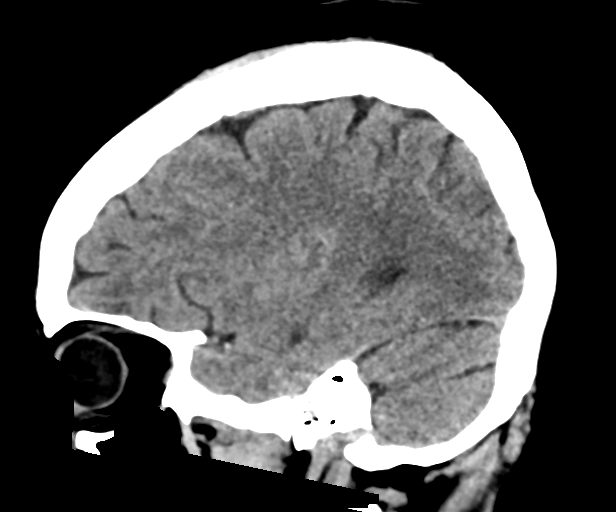

[16 of 47 positions shown; findings below may reference images not displayed]

FINDINGS: Brain: No acute intracranial findings are seen. There are no signs
of bleeding within the cranium. Cavum septum vergae and cavum septum
pellucidum are noted. Ventricles are not dilated. There are old
lacunar infarcts in basal ganglia on both sides with no significant
interval change.

Vascular: Scattered arterial calcifications are seen.

Skull: Unremarkable.

Sinuses/Orbits: Unremarkable.

Other: None
IMPRESSION: No acute intracranial findings are seen in noncontrast CT brain.

## 2022-03-23 IMAGING — CT CT ANGIO CHEST
2 of 7 series · 17 of 46 positions shown · IV contrast (APPLIED)
Comparison: [DATE]

CLINICAL DATA: Recent fall.

EXAM:
CT ANGIOGRAPHY CHEST WITH CONTRAST
TECHNIQUE: Multidetector CT imaging of the chest was performed using the
standard protocol during bolus administration of intravenous
contrast. Multiplanar CT image reconstructions and MIPs were
obtained to evaluate the vascular anatomy.

[Series 7: thins · axial · 0.63mm/px · z∈[+1427,+1626]mm · 14 of 319 slices shown]
[im 18/319  lung]
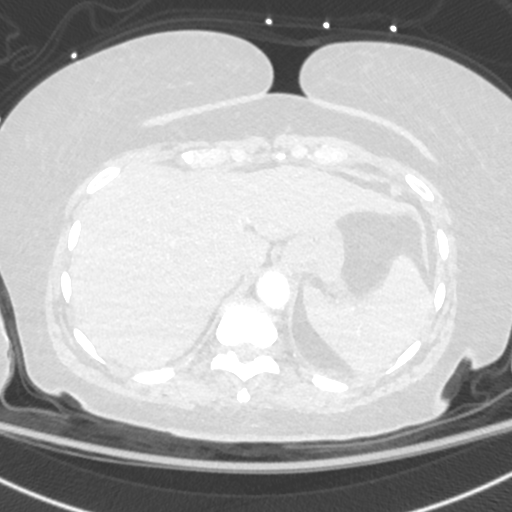
[im 36/319  soft-tissue]
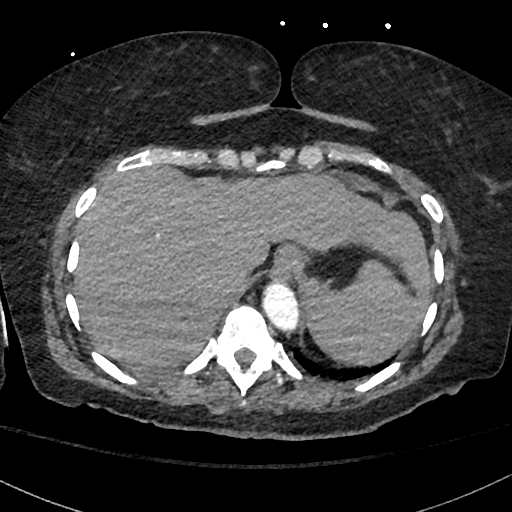
[im 71/319  lung]
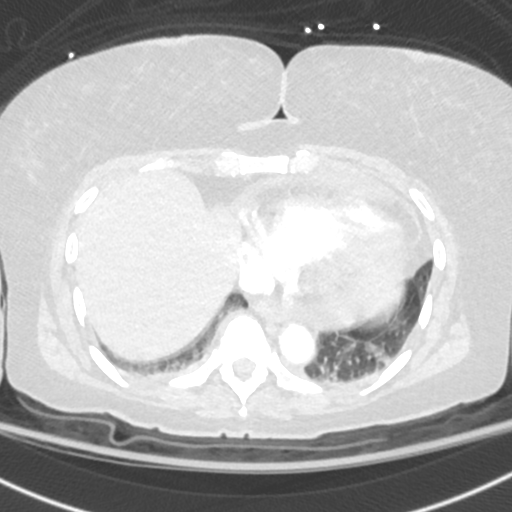
[im 89/319  soft-tissue]
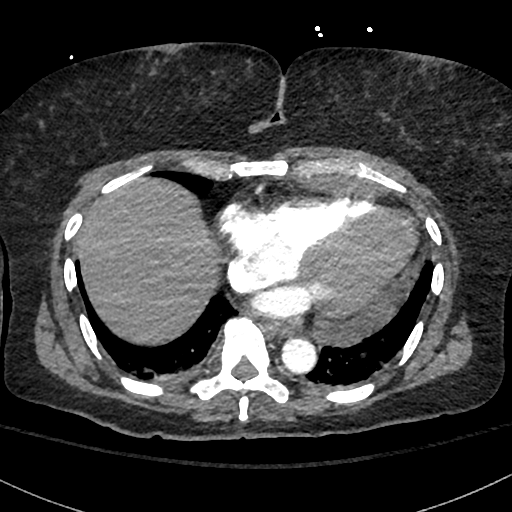
[im 107/319  lung]
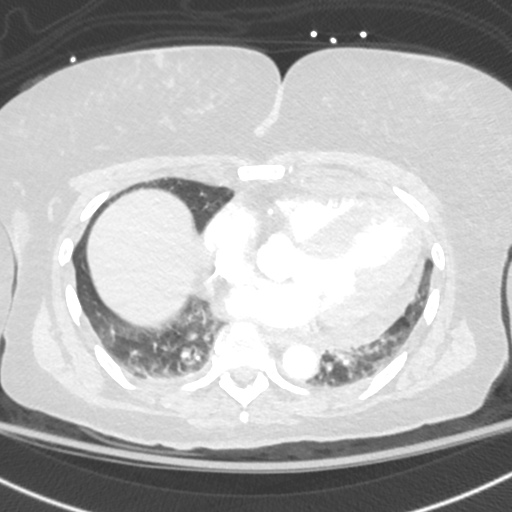
[im 124/319  soft-tissue]
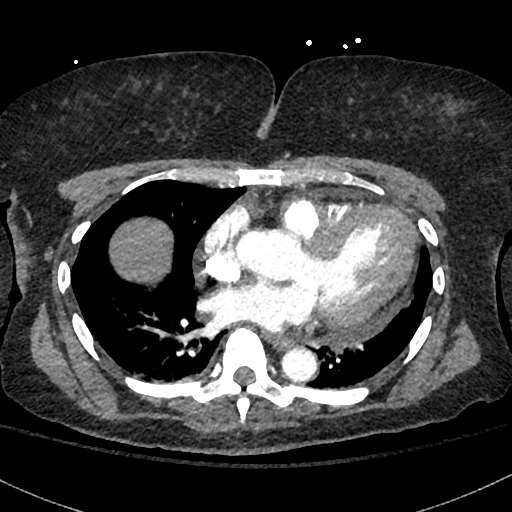
[im 142/319  lung]
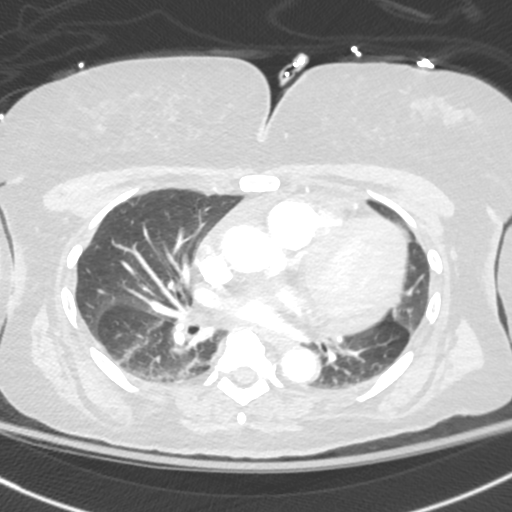
[im 177/319  soft-tissue]
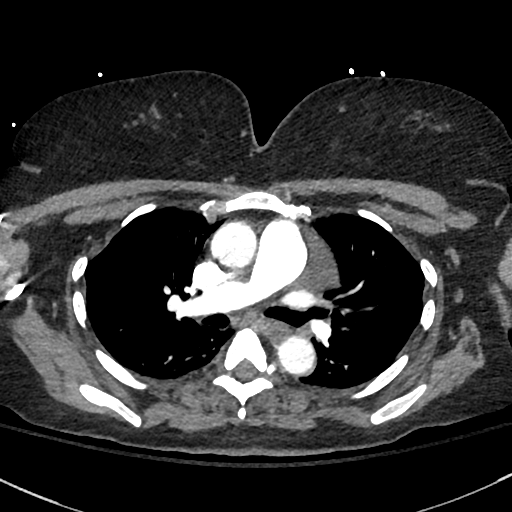
[im 195/319  lung]
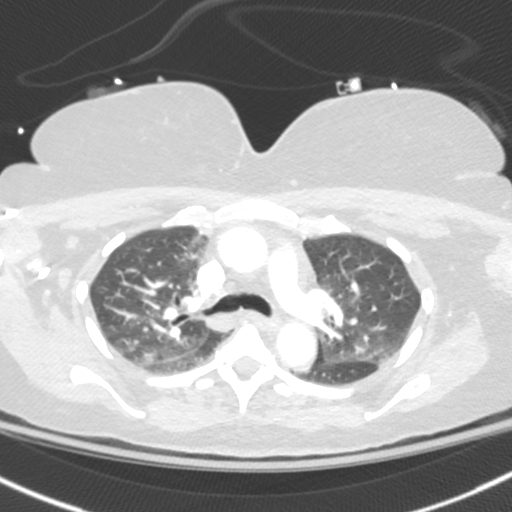
[im 213/319  soft-tissue]
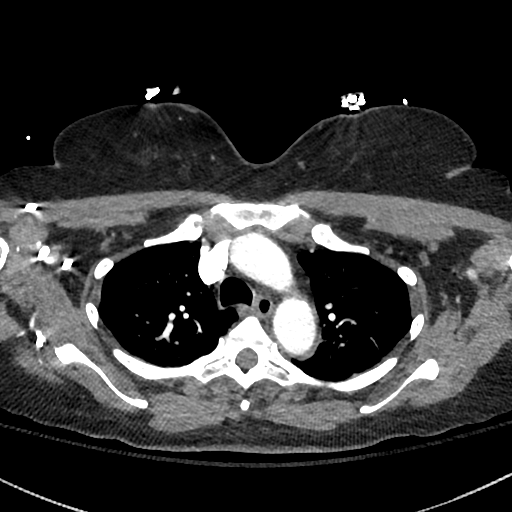
[im 230/319  lung]
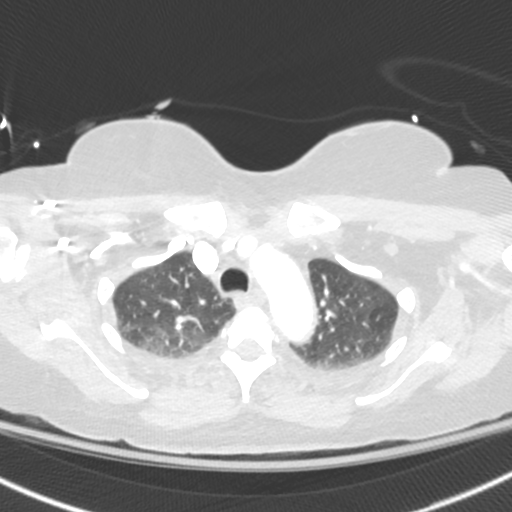
[im 248/319  soft-tissue]
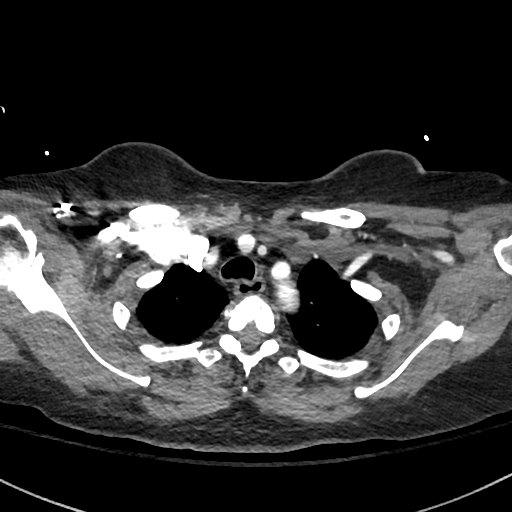
[im 283/319  lung]
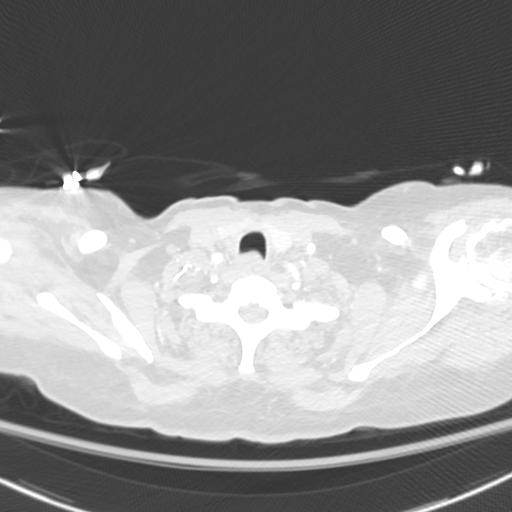
[im 301/319  soft-tissue]
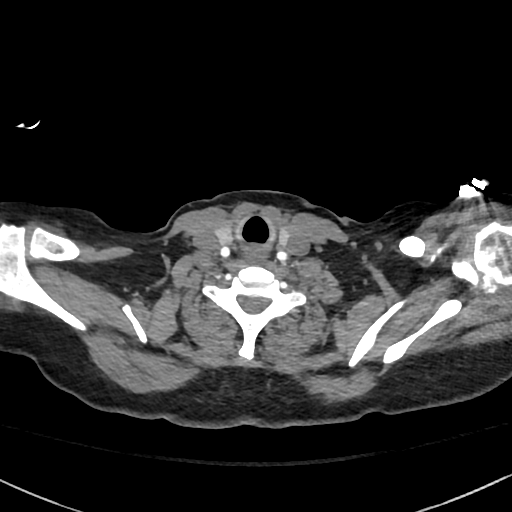

[Series 8: cor · coronal · 0.54mm/px · 3 of 104 slices shown]
[im 26/104  soft-tissue]
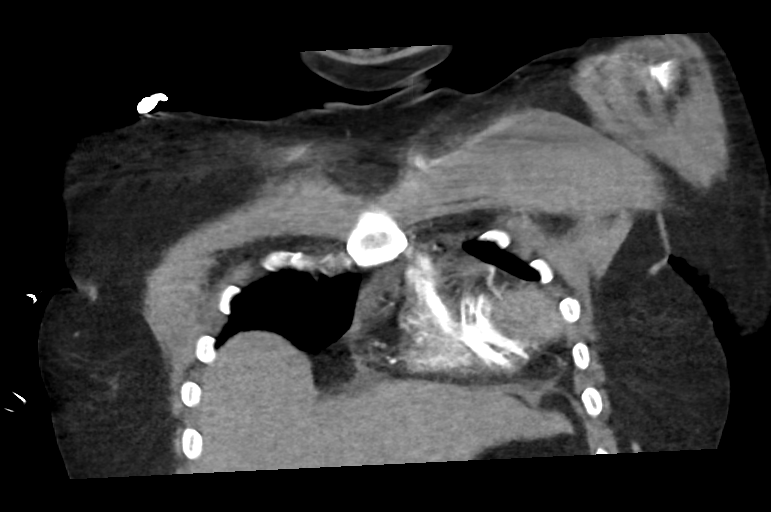
[im 52/104  soft-tissue]
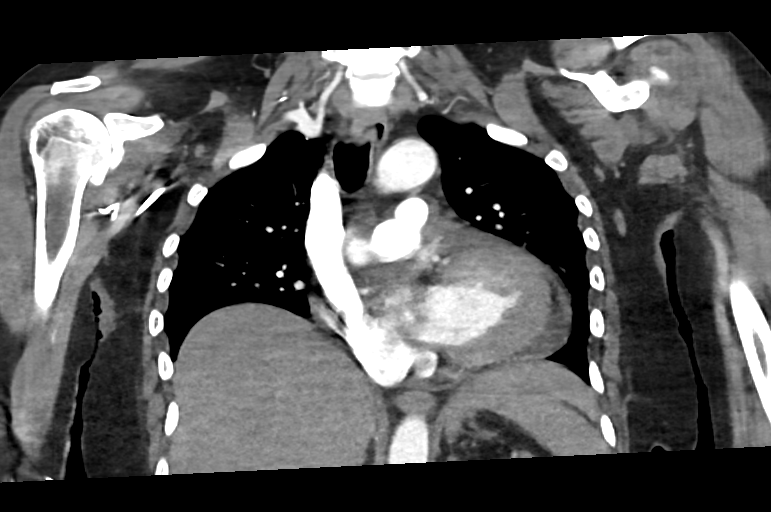
[im 78/104  soft-tissue]
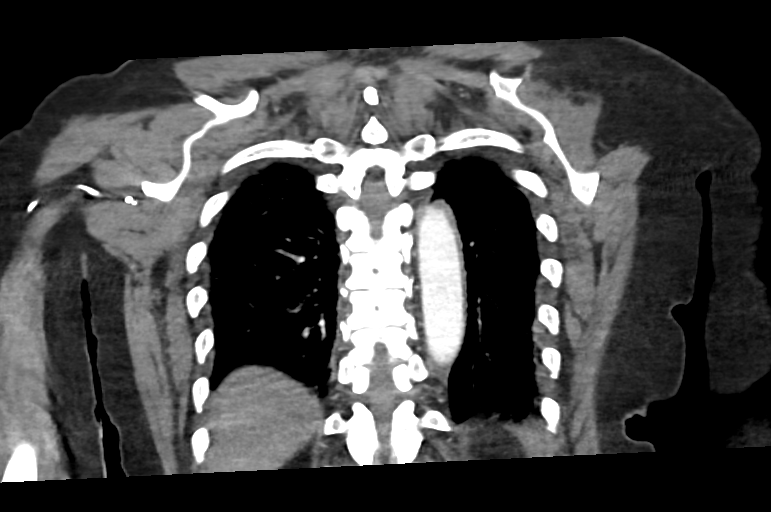

[17 of 46 positions shown; findings below may reference images not displayed]

RADIATION DOSE REDUCTION: This exam was performed according to the
departmental dose-optimization program which includes automated
exposure control, adjustment of the mA and/or kV according to
patient size and/or use of iterative reconstruction technique.

CONTRAST:  75mL OMNIPAQUE IOHEXOL 350 MG/ML SOLN
FINDINGS: Cardiovascular: Satisfactory opacification of the pulmonary arteries
to the segmental level. No evidence of pulmonary embolism. There is
mild to moderate severity cardiomegaly. A small to moderate sized
pericardial effusion is seen (maximum thickness of 1.3 cm in AP
measurement). This is increased in size when compared to the prior
exam.

Mediastinum/Nodes: No enlarged mediastinal, hilar, or axillary lymph
nodes. Thyroid gland, trachea, and esophagus demonstrate no
significant findings.

Lungs/Pleura: Mild, hazy atelectatic changes are seen within the
bilateral upper lobes and bilateral lower lobes.

There is no evidence of a pleural effusion or pneumothorax.

Upper Abdomen: No acute abnormality.

Musculoskeletal: No chest wall abnormality. No acute or significant
osseous findings.

Review of the MIP images confirms the above findings.
IMPRESSION: 1. No evidence of pulmonary embolism.
2. Mild to moderate severity cardiomegaly with a small to moderate
sized pericardial effusion.
3. Mild, hazy bilateral upper lobe and bilateral lower lobe
atelectasis.

## 2022-03-23 MED ORDER — SODIUM CHLORIDE 0.9 % IV SOLN
1.0000 g | Freq: Once | INTRAVENOUS | Status: AC
  Administered 2022-03-24: 1 g via INTRAVENOUS
  Filled 2022-03-23: qty 10

## 2022-03-23 MED ORDER — LABETALOL HCL 5 MG/ML IV SOLN
10.0000 mg | Freq: Once | INTRAVENOUS | Status: AC
  Administered 2022-03-23: 10 mg via INTRAVENOUS
  Filled 2022-03-23: qty 4

## 2022-03-23 MED ORDER — SODIUM CHLORIDE 0.9 % IV BOLUS
1000.0000 mL | Freq: Once | INTRAVENOUS | Status: AC
  Administered 2022-03-23: 1000 mL via INTRAVENOUS

## 2022-03-23 MED ORDER — FENTANYL CITRATE PF 50 MCG/ML IJ SOSY
50.0000 ug | PREFILLED_SYRINGE | Freq: Once | INTRAMUSCULAR | Status: AC
  Administered 2022-03-23: 50 ug via INTRAVENOUS
  Filled 2022-03-23: qty 1

## 2022-03-23 MED ORDER — LABETALOL HCL 5 MG/ML IV SOLN
20.0000 mg | Freq: Once | INTRAVENOUS | Status: AC
  Administered 2022-03-23: 20 mg via INTRAVENOUS
  Filled 2022-03-23: qty 4

## 2022-03-23 MED ORDER — IOHEXOL 350 MG/ML SOLN
75.0000 mL | Freq: Once | INTRAVENOUS | Status: AC | PRN
  Administered 2022-03-23: 75 mL via INTRAVENOUS

## 2022-03-23 MED ORDER — LACTATED RINGERS IV BOLUS
500.0000 mL | Freq: Once | INTRAVENOUS | Status: AC
  Administered 2022-03-23: 500 mL via INTRAVENOUS

## 2022-03-23 NOTE — ED Notes (Signed)
Patient transported to CT 

## 2022-03-23 NOTE — ED Notes (Signed)
Bladder scanned pt to show >563mls. MD [place] made aware

## 2022-03-23 NOTE — ED Triage Notes (Signed)
Pt arrived to ED via EMS from home w/ c/o tachycardia. Pt was discharged from here on Thursday after being admitted to the hospital for 2 months d/t CVA. 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 Home health went to see pt today and found pt to be tachycardic in the 130's. Pt's meds were not filled until yesterday and has not had any of her daily meds since she was discharged from the hospital. IV 22g L hand, NS given by EMS. 153/104, HR 122, CBG 147, 100% RA, T 97.1.

## 2022-03-23 NOTE — ED Provider Notes (Addendum)
[facility] EMERGENCY DEPARTMENT Provider Note   CSN: [id number] Arrival date & time: 03/23/22  1413     History  Chief Complaint  Patient presents with   Tachycardia    Cindy Brooks is a 51 y.o. female.  HPI Patient presents for tachycardia.  She recently had a hospitalization for strokes.  She was hospitalized for 2 months.  She was discharged only 2 days ago.  At the time, skilled nursing facility placement was recommended but patient's family preferred to take her home.  Home nurse stop by today and found her to be tachycardic.  Patient's family reports that they were not able to pick up her outpatient medicines until yesterday and she has not received any doses of these since she was released from the hospital.  Per family, patient has had diminished strength in her upper extremities and lost her ability to talk.  When she left the hospital, she was speaking in short phrases.  She is now nonverbal.  Time of these changes is unknown.  She did have a fall recently that was unwitnessed.  It was suspected that she fell out of her bed.  She was found facedown on the floor.  Time of this fall is unknown.  Patient has been eating by mouth without PEG tube was left in place from her recent hospitalization.  She has had at least 1 bowel movement since her discharge.    Home Medications Prior to Admission medications   Medication Sig Start Date End Date Taking? Authorizing Provider  acetaminophen (TYLENOL) 325 MG tablet Take 2 tablets (650 mg total) by mouth every 4 (four) hours as needed. 03/21/22 03/21/23  Almon Hercules, MD  amantadine (SYMMETREL) 100 MG capsule Take 1 capsule (100 mg total) by mouth 2 (two) times daily. 03/21/22 05/20/22  Almon Hercules, MD  amLODipine (NORVASC) 10 MG tablet Take 1 tablet (10 mg total) by mouth daily. 03/21/22   Almon Hercules, MD  atorvastatin (LIPITOR) 80 MG tablet Take 1 tablet (80 mg total) by mouth daily. 03/21/22   Almon Hercules, MD  clopidogrel  (PLAVIX) 75 MG tablet Take 1 tablet (75 mg total) by mouth daily. 03/21/22   Almon Hercules, MD  feeding supplement (ENSURE ENLIVE / ENSURE PLUS) LIQD Take 237 mLs by mouth 3 (three) times daily between meals. 03/21/22 04/20/22  Almon Hercules, MD  folic acid (FOLVITE) 1 MG tablet Take 1 tablet (1 mg total) by mouth daily. 03/21/22   Almon Hercules, MD  gabapentin (NEURONTIN) 100 MG capsule Take 1 capsule (100 mg total) by mouth 2 (two) times daily. 03/21/22   Almon Hercules, MD  glycopyrrolate (ROBINUL) 1 MG tablet Take 0.5 tablets (0.5 mg total) by mouth 2 (two) times daily. 03/21/22   Almon Hercules, MD  hydrALAZINE (APRESOLINE) 50 MG tablet Take 1 tablet (50 mg total) by mouth every 8 (eight) hours. 03/21/22 05/20/22  Almon Hercules, MD  ipratropium-albuterol (DUONEB) 0.5-2.5 (3) MG/3ML SOLN Take 3 mLs by nebulization every 6 (six) hours as needed. 03/21/22   Almon Hercules, MD  Multiple Vitamin (MULTIVITAMIN WITH MINERALS) TABS tablet Take 1 tablet by mouth daily. 03/21/22   Almon Hercules, MD  Nystatin (GERHARDT'S BUTT CREAM) CREA Apply 1 application. topically daily as needed for irritation. 03/21/22   Almon Hercules, MD  polyethylene glycol powder (MIRALAX) 17 GM/SCOOP powder Take 17 g by mouth 2 (two) times daily as needed for moderate constipation. 03/21/22  Mercy Riding, MD  propranolol (INDERAL) 40 MG tablet Take 1 tablet (40 mg total) by mouth 3 (three) times daily. 03/21/22 05/20/22  Mercy Riding, MD  senna-docusate (SENOKOT-S) 8.6-50 MG tablet Take 1 tablet by mouth 2 (two) times daily between meals as needed for mild constipation. 03/21/22   Mercy Riding, MD  thiamine 100 MG tablet Take 1 tablet (100 mg total) by mouth daily. 03/21/22   Mercy Riding, MD      Allergies    Aspirin, Codeine, Penicillins, and Strawberry extract    Review of Systems   Review of Systems  Unable to perform ROS: Patient nonverbal    Physical Exam Updated Vital Signs BP (!) 185/123   Pulse (!) 135   Temp 99.4 F (37.4 C) (Rectal)    Resp (!) 36   Ht 5\' 4"  (1.626 m)   Wt 79.2 kg   LMP  (LMP Unknown)   SpO2 98%   BMI 29.97 kg/m  Physical Exam Vitals and nursing note reviewed.  Constitutional:      General: She is not in acute distress.    Appearance: Normal appearance. She is well-developed. She is ill-appearing. She is not toxic-appearing or diaphoretic.  HENT:     Head: Normocephalic.     Right Ear: External ear normal.     Left Ear: External ear normal.     Nose: Nose normal.     Mouth/Throat:     Mouth: Mucous membranes are moist.     Pharynx: Oropharynx is clear.  Eyes:     Extraocular Movements: Extraocular movements intact.     Conjunctiva/sclera: Conjunctivae normal.  Cardiovascular:     Rate and Rhythm: Regular rhythm. Tachycardia present.  Pulmonary:     Effort: Pulmonary effort is normal. No respiratory distress.     Breath sounds: Normal breath sounds.  Abdominal:     General: There is no distension.     Palpations: Abdomen is soft.     Tenderness: There is no abdominal tenderness.     Comments: PEG tube in place without surrounding skin change  Musculoskeletal:        General: No swelling, tenderness or deformity.     Cervical back: Normal range of motion and neck supple.     Right lower leg: No edema.     Left lower leg: No edema.     Comments: Ace bandage present on the left knee.  This was wrapped very tight and there was distal swelling.  Skin:    General: Skin is warm and dry.     Coloration: Skin is not jaundiced or pale.  Neurological:     Mental Status: She is alert.     GCS: GCS eye subscore is 4. GCS verbal subscore is 1. GCS motor subscore is 6.     Cranial Nerves: Facial asymmetry (Baseline) present.     Comments: Patient able to move all extremities and follow commands.  She does have global weakness.  She is nonverbal.     ED Results / Procedures / Treatments   Labs (all labs ordered are listed, but only abnormal results are displayed) Labs Reviewed  URINE CULTURE   CULTURE, BLOOD (ROUTINE X 2)  CULTURE, BLOOD (ROUTINE X 2)  COMPREHENSIVE METABOLIC PANEL  CBC WITH DIFFERENTIAL/PLATELET  URINALYSIS, ROUTINE W REFLEX MICROSCOPIC  MAGNESIUM  D-DIMER, QUANTITATIVE  LACTIC ACID, PLASMA  LACTIC ACID, PLASMA  PROTIME-INR  TSH  T4, FREE  T3, FREE  RAPID URINE DRUG SCREEN,  HOSP PERFORMED  CBG MONITORING, ED  TROPONIN I (HIGH SENSITIVITY)  TROPONIN I (HIGH SENSITIVITY)    EKG EKG Interpretation  Date/Time:  Saturday March 23 2022 14:28:16 EDT Ventricular Rate:  130 PR Interval:  151 QRS Duration: 90 QT Interval:  269 QTC Calculation: 396 R Axis:   -82 Text Interpretation: Sinus tachycardia Consider right atrial enlargement Anterolateral infarct, old Confirmed by Thamas Jaegers ([id number]) on 03/23/2022 3:04:01 PM  Radiology CT HEAD WO CONTRAST  Result Date: 03/23/2022 CLINICAL DATA:  Neurological deficit EXAM: CT HEAD WITHOUT CONTRAST TECHNIQUE: Contiguous axial images were obtained from the base of the skull through the vertex without intravenous contrast. RADIATION DOSE REDUCTION: This exam was performed according to the departmental dose-optimization program which includes automated exposure control, adjustment of the mA and/or kV according to patient size and/or use of iterative reconstruction technique. COMPARISON:  Previous studies including MR brain done on 01/30/2022 and CT done on 01/22/2022 FINDINGS: Brain: No acute intracranial findings are seen. There are no signs of bleeding within the cranium. Cavum septum vergae and cavum septum pellucidum are noted. Ventricles are not dilated. There are old lacunar infarcts in basal ganglia on both sides with no significant interval change. Vascular: Scattered arterial calcifications are seen. Skull: Unremarkable. Sinuses/Orbits: Unremarkable. Other: None IMPRESSION: No acute intracranial findings are seen in noncontrast CT brain. Electronically Signed   By: Elmer Picker M.D.   On: 03/23/2022 15:42   DG  Chest Port 1 View  Result Date: 03/23/2022 CLINICAL DATA:  Tachycardia EXAM: PORTABLE CHEST 1 VIEW COMPARISON:  02/20/2022 FINDINGS: Cardiomegaly. Both lungs are clear. The visualized skeletal structures are unremarkable. IMPRESSION: Cardiomegaly without acute abnormality of the lungs in AP portable projection. Electronically Signed   By: Delanna Ahmadi M.D.   On: 03/23/2022 15:32    Procedures Procedures    Medications Ordered in ED Medications  lactated ringers bolus 500 mL (has no administration in time range)  sodium chloride 0.9 % bolus 1,000 mL (has no administration in time range)  fentaNYL (SUBLIMAZE) injection 50 mcg (has no administration in time range)    ED Course/ Medical Decision Making/ A&P                           Medical Decision Making Amount and/or Complexity of Data Reviewed Labs: ordered. Radiology: ordered.  Risk Prescription drug management.   This patient presents to the ED for concern of tachycardia, this involves an extensive number of treatment options, and is a complaint that carries with it a high risk of complications and morbidity.  The differential diagnosis includes dehydration, infection, pain that she is unable to verbalize, PE   Co morbidities that complicate the patient evaluation  HTN, recent hospitalization for multiple CVA, encephalopathy, dysphagia, HCAP, respiratory failure, tracheostomy, PEG tube placement, urinary retention.   Additional history obtained:  Additional history obtained from EMS, patient's daughter External records from outside source obtained and reviewed including EMR   Lab Tests:  I Ordered, and personally interpreted labs.  The pertinent results include: (Results pending at time of signout)   Imaging Studies ordered:  I ordered imaging studies including CT head, chest x-ray I independently visualized and interpreted imaging which showed no acute finding I agree with the radiologist  interpretation   Cardiac Monitoring: / EKG:  The patient was maintained on a cardiac monitor.  I personally viewed and interpreted the cardiac monitored which showed an underlying rhythm of: Sinus rhythm   Problem  List / ED Course / Critical interventions / Medication management  Patient is a 51 year old female who presents from home for tachycardia.  She was reportedly in good health up until 2 months ago.  2 months ago, she had a admission to the hospital during which time she had multiple strokes, required intubation and tracheostomy, required PEG tube.  Tracheostomy was reversed.  Patient was able to take in food and drink by mouth and PEG tube was left in place.  She was reportedly able to speak in short phrases at time of discharge.  SNF placement was recommended but patient's family preferred to bring her home.  She was discharged from the hospital only 2 days ago.  She has not been on any other medications at home since discharge.  Patient's daughter reports that she had a fall off of the bed and was found facedown on the floor.  Since her discharge, she has become nonverbal and has increased global weakness.  On arrival, patient's heart rate in the range of 130.  She is awake but unable to answer questions.  She is able to follow commands.  When pressing on extremities, chest, and abdomen, patient does not elicit any evidence of tenderness.  Given her nonverbal status, tachycardia could be, in part, due to current pain.  Fentanyl was ordered for analgesia.  Additionally, she is prescribed propanolol has not taken this medication in the past 2 days, which also may be contributing to her current tachycardia.  IV fluids were ordered for possible dehydration.  Patient is tachycardic and tachypneic, on check of rectal temperature, patient is normothermic.  Although her immobility puts her at risk for infection, no clear source at this time.  Her breathing appears unlabored and her SPO2 is normal on  room air.  Given her nonverbal state, it is difficult to identify reasons for her tachycardia or the worsened global weakness and aphasia since her discharge.  Patient's daughter does accompany her at bedside but is uncertain of the timeline of when her speech became limited or when she had the fall at home.  Broad work-up was initiated.  Results of work-up were pending at time of signout.  Care of patient was signed out to oncoming ED provider. I ordered medication including IV fluids for tachycardia; fentanyl for analgesia Reevaluation of the patient after these medicines showed that the patient stayed the same I have reviewed the patients home medicines and have made adjustments as needed   Social Determinants of Health:  Residual deficits from recent strokes, requiring nursing care          Final Clinical Impression(s) / ED Diagnoses Final diagnoses:  Tachycardia  Aphasia    Rx / DC Orders ED Discharge Orders     None          Godfrey Pick, MD 03/23/22 1654

## 2022-03-23 NOTE — ED Notes (Signed)
Daughter Cindy Brooks [phone] would like an update asap

## 2022-03-23 NOTE — TOC Progression Note (Addendum)
Transition of Care [facility]) - Progression Note    Patient Details  Name: Cindy Brooks MRN: [id number] Date of Birth: 10-30-1970  [facility] CM/SW Contact  Lockie Pares, RN Phone Number: 03/23/2022, 3:19 PM  Clinical Narrative:    Patient was in the hospital over 2 months with CVA had PEG. Medicaid potential. DPT team was working on placement. Did go home with PTAR. Both daughters are taking care of patient. But both were working today was supposed to be daughters last day, then she was going to care for mom full time. DC Thursday , medications picked up Friday from pharmacy , but never given to patient. PEG in place but daughter is with her and  states the patient was eating, moving arms, and verbalizing at home . Patient doing none of this currently. She did fall yesterday, she heard a knock on the door, daughter was in bath, she tried to get up to get the door and fell.according to daughter. Her son was at the door and heard the thud. He got in and got the patient back to bed.  BP here 190's /120's HR 140. Going for CT scan currently.    PEG tube has not been used since she got home, she has been eating by mouth according to the daughter. She was also supposed to have a foley when she came home, she was working yesterday but does not see it, unsure where it is.     Expected Discharge Plan and Services                                                 Social Determinants of Health (SDOH) Interventions    Readmission Risk Interventions    03/13/2022    9:03 AM  Readmission Risk Prevention Plan  Transportation Screening Complete  PCP or Specialist Appt within 5-7 Days Complete  Home Care Screening Complete  Medication Review (RN CM) Complete

## 2022-03-23 NOTE — ED Notes (Signed)
Pt's daughter reports pt has been talking and able to raise arms. Pt is not able to hold arms up and is not talking. Daughter report pt fell the other day. Notified Doren Custard, MD

## 2022-03-23 NOTE — ED Notes (Signed)
Pt transported to CT ?

## 2022-03-24 ENCOUNTER — Inpatient Hospital Stay (HOSPITAL_COMMUNITY): Payer: 59

## 2022-03-24 ENCOUNTER — Observation Stay (HOSPITAL_COMMUNITY): Payer: 59

## 2022-03-24 ENCOUNTER — Encounter (HOSPITAL_COMMUNITY): Payer: Self-pay | Admitting: Internal Medicine

## 2022-03-24 ENCOUNTER — Other Ambulatory Visit: Payer: Self-pay

## 2022-03-24 DIAGNOSIS — Z91148 Patient's other noncompliance with medication regimen for other reason: Secondary | ICD-10-CM | POA: Diagnosis not present

## 2022-03-24 DIAGNOSIS — N390 Urinary tract infection, site not specified: Secondary | ICD-10-CM | POA: Diagnosis present

## 2022-03-24 DIAGNOSIS — G9340 Encephalopathy, unspecified: Secondary | ICD-10-CM | POA: Diagnosis not present

## 2022-03-24 DIAGNOSIS — Z9102 Food additives allergy status: Secondary | ICD-10-CM | POA: Diagnosis not present

## 2022-03-24 DIAGNOSIS — R4701 Aphasia: Secondary | ICD-10-CM

## 2022-03-24 DIAGNOSIS — I10 Essential (primary) hypertension: Secondary | ICD-10-CM | POA: Diagnosis present

## 2022-03-24 DIAGNOSIS — R000 Tachycardia, unspecified: Secondary | ICD-10-CM | POA: Diagnosis present

## 2022-03-24 DIAGNOSIS — R338 Other retention of urine: Secondary | ICD-10-CM

## 2022-03-24 DIAGNOSIS — D6862 Lupus anticoagulant syndrome: Secondary | ICD-10-CM | POA: Diagnosis present

## 2022-03-24 DIAGNOSIS — R1310 Dysphagia, unspecified: Secondary | ICD-10-CM | POA: Diagnosis present

## 2022-03-24 DIAGNOSIS — Z8673 Personal history of transient ischemic attack (TIA), and cerebral infarction without residual deficits: Secondary | ICD-10-CM | POA: Diagnosis not present

## 2022-03-24 DIAGNOSIS — I63513 Cerebral infarction due to unspecified occlusion or stenosis of bilateral middle cerebral arteries: Secondary | ICD-10-CM

## 2022-03-24 DIAGNOSIS — Z79899 Other long term (current) drug therapy: Secondary | ICD-10-CM | POA: Diagnosis not present

## 2022-03-24 DIAGNOSIS — D6861 Antiphospholipid syndrome: Secondary | ICD-10-CM | POA: Diagnosis not present

## 2022-03-24 DIAGNOSIS — Z7902 Long term (current) use of antithrombotics/antiplatelets: Secondary | ICD-10-CM | POA: Diagnosis not present

## 2022-03-24 DIAGNOSIS — Z931 Gastrostomy status: Secondary | ICD-10-CM | POA: Diagnosis not present

## 2022-03-24 DIAGNOSIS — Z880 Allergy status to penicillin: Secondary | ICD-10-CM | POA: Diagnosis not present

## 2022-03-24 DIAGNOSIS — G9341 Metabolic encephalopathy: Secondary | ICD-10-CM

## 2022-03-24 DIAGNOSIS — B964 Proteus (mirabilis) (morganii) as the cause of diseases classified elsewhere: Secondary | ICD-10-CM | POA: Diagnosis present

## 2022-03-24 DIAGNOSIS — F1010 Alcohol abuse, uncomplicated: Secondary | ICD-10-CM | POA: Diagnosis present

## 2022-03-24 DIAGNOSIS — N3000 Acute cystitis without hematuria: Secondary | ICD-10-CM

## 2022-03-24 DIAGNOSIS — Z886 Allergy status to analgesic agent status: Secondary | ICD-10-CM | POA: Diagnosis not present

## 2022-03-24 DIAGNOSIS — Z885 Allergy status to narcotic agent status: Secondary | ICD-10-CM | POA: Diagnosis not present

## 2022-03-24 LAB — COMPREHENSIVE METABOLIC PANEL WITH GFR
ALT: 59 U/L — ABNORMAL HIGH (ref 0–44)
AST: 38 U/L (ref 15–41)
Albumin: 3.4 g/dL — ABNORMAL LOW (ref 3.5–5.0)
Alkaline Phosphatase: 91 U/L (ref 38–126)
Anion gap: 11 (ref 5–15)
BUN: 7 mg/dL (ref 6–20)
CO2: 21 mmol/L — ABNORMAL LOW (ref 22–32)
Calcium: 9.1 mg/dL (ref 8.9–10.3)
Chloride: 110 mmol/L (ref 98–111)
Creatinine, Ser: 0.64 mg/dL (ref 0.44–1.00)
GFR, Estimated: >60 mL/min
Glucose, Bld: 102 mg/dL — ABNORMAL HIGH (ref 70–99)
Potassium: 3.9 mmol/L (ref 3.5–5.1)
Sodium: 142 mmol/L (ref 135–145)
Total Bilirubin: 0.9 mg/dL (ref 0.3–1.2)
Total Protein: 6.8 g/dL (ref 6.5–8.1)

## 2022-03-24 LAB — TSH: TSH: 1.744 u[IU]/mL (ref 0.350–4.500)

## 2022-03-24 LAB — CBC
HCT: 34.2 % — ABNORMAL LOW (ref 36.0–46.0)
Hemoglobin: 11.4 g/dL — ABNORMAL LOW (ref 12.0–15.0)
MCH: 27.1 pg (ref 26.0–34.0)
MCHC: 33.3 g/dL (ref 30.0–36.0)
MCV: 81.4 fL (ref 80.0–100.0)
Platelets: 335 10*3/uL (ref 150–400)
RBC: 4.2 MIL/uL (ref 3.87–5.11)
RDW: 15.6 % — ABNORMAL HIGH (ref 11.5–15.5)
WBC: 7.6 10*3/uL (ref 4.0–10.5)
nRBC: 0 % (ref 0.0–0.2)

## 2022-03-24 LAB — CREATININE, SERUM
Creatinine, Ser: 0.62 mg/dL (ref 0.44–1.00)
GFR, Estimated: >60 mL/min

## 2022-03-24 LAB — CBG MONITORING, ED: Glucose-Capillary: 114 mg/dL — ABNORMAL HIGH (ref 70–99)

## 2022-03-24 MED ORDER — LABETALOL HCL 5 MG/ML IV SOLN
10.0000 mg | INTRAVENOUS | Status: DC | PRN
Start: 2022-03-24 — End: 2022-03-24

## 2022-03-24 MED ORDER — CHLORHEXIDINE GLUCONATE 0.12 % MT SOLN
15.0000 mL | Freq: Two times a day (BID) | OROMUCOSAL | Status: DC
  Administered 2022-03-24 – 2022-03-26 (×4): 15 mL via OROMUCOSAL
  Filled 2022-03-24 (×4): qty 15

## 2022-03-24 MED ORDER — ORAL CARE MOUTH RINSE
15.0000 mL | Freq: Two times a day (BID) | OROMUCOSAL | Status: DC
  Administered 2022-03-24 – 2022-03-26 (×4): 15 mL via OROMUCOSAL

## 2022-03-24 MED ORDER — PROPRANOLOL HCL 40 MG PO TABS
40.0000 mg | ORAL_TABLET | Freq: Three times a day (TID) | ORAL | Status: DC
Start: 2022-03-24 — End: 2022-03-26
  Administered 2022-03-24 – 2022-03-26 (×7): 40 mg
  Filled 2022-03-24 (×10): qty 1

## 2022-03-24 MED ORDER — HYDRALAZINE HCL 20 MG/ML IJ SOLN: 10.0000 mg | INTRAMUSCULAR | Status: DC | PRN

## 2022-03-24 MED ORDER — AMANTADINE HCL 100 MG PO CAPS
100.0000 mg | ORAL_CAPSULE | Freq: Two times a day (BID) | ORAL | Status: DC
Start: 2022-03-24 — End: 2022-03-26
  Administered 2022-03-24 – 2022-03-26 (×6): 100 mg
  Filled 2022-03-24 (×7): qty 1

## 2022-03-24 MED ORDER — FOLIC ACID 1 MG PO TABS
1.0000 mg | ORAL_TABLET | Freq: Every day | ORAL | Status: DC
  Administered 2022-03-24 – 2022-03-26 (×3): 1 mg
  Filled 2022-03-24 (×3): qty 1

## 2022-03-24 MED ORDER — ENSURE ENLIVE PO LIQD
237.0000 mL | Freq: Three times a day (TID) | ORAL | Status: DC
  Administered 2022-03-24 – 2022-03-26 (×8): 237 mL
  Filled 2022-03-24 (×3): qty 237

## 2022-03-24 MED ORDER — IPRATROPIUM-ALBUTEROL 0.5-2.5 (3) MG/3ML IN SOLN
3.0000 mL | Freq: Four times a day (QID) | RESPIRATORY_TRACT | Status: DC | PRN
Start: 2022-03-24 — End: 2022-03-26

## 2022-03-24 MED ORDER — CHLORHEXIDINE GLUCONATE CLOTH 2 % EX PADS
6.0000 | MEDICATED_PAD | Freq: Every day | CUTANEOUS | Status: DC
  Administered 2022-03-25 – 2022-03-26 (×2): 6 via TOPICAL

## 2022-03-24 MED ORDER — ENOXAPARIN SODIUM 40 MG/0.4ML IJ SOSY
40.0000 mg | PREFILLED_SYRINGE | INTRAMUSCULAR | Status: DC
  Administered 2022-03-24 – 2022-03-26 (×3): 40 mg via SUBCUTANEOUS
  Filled 2022-03-24 (×3): qty 0.4

## 2022-03-24 MED ORDER — CHLORHEXIDINE GLUCONATE 0.12 % MT SOLN
OROMUCOSAL | Status: AC
  Filled 2022-03-24: qty 15

## 2022-03-24 MED ORDER — CLOPIDOGREL BISULFATE 75 MG PO TABS
75.0000 mg | ORAL_TABLET | Freq: Every day | ORAL | Status: DC
  Administered 2022-03-24 – 2022-03-26 (×3): 75 mg
  Filled 2022-03-24 (×4): qty 1

## 2022-03-24 MED ORDER — AMLODIPINE BESYLATE 10 MG PO TABS
10.0000 mg | ORAL_TABLET | Freq: Every day | ORAL | Status: DC
  Administered 2022-03-24 – 2022-03-26 (×3): 10 mg
  Filled 2022-03-24: qty 2
  Filled 2022-03-24 (×2): qty 1

## 2022-03-24 MED ORDER — ACETAMINOPHEN 325 MG PO TABS
650.0000 mg | ORAL_TABLET | ORAL | Status: DC | PRN
  Administered 2022-03-24: 650 mg
  Filled 2022-03-24: qty 2

## 2022-03-24 MED ORDER — THIAMINE HCL 100 MG PO TABS
100.0000 mg | ORAL_TABLET | Freq: Every day | ORAL | Status: DC
  Administered 2022-03-24 – 2022-03-26 (×3): 100 mg
  Filled 2022-03-24 (×3): qty 1

## 2022-03-24 MED ORDER — ATORVASTATIN CALCIUM 80 MG PO TABS
80.0000 mg | ORAL_TABLET | Freq: Every day | ORAL | Status: DC
  Administered 2022-03-24 – 2022-03-26 (×3): 80 mg
  Filled 2022-03-24 (×2): qty 1
  Filled 2022-03-24: qty 2

## 2022-03-24 MED ORDER — POLYETHYLENE GLYCOL 3350 17 G PO PACK: 17.0000 g | PACK | Freq: Two times a day (BID) | ORAL | Status: DC | PRN

## 2022-03-24 MED ORDER — HYDRALAZINE HCL 50 MG PO TABS
50.0000 mg | ORAL_TABLET | Freq: Three times a day (TID) | ORAL | Status: DC
  Administered 2022-03-24 – 2022-03-26 (×8): 50 mg via ORAL
  Filled 2022-03-24 (×3): qty 1
  Filled 2022-03-24: qty 2
  Filled 2022-03-24 (×4): qty 1

## 2022-03-24 MED ORDER — LABETALOL HCL 5 MG/ML IV SOLN: 10.0000 mg | INTRAVENOUS | Status: DC | PRN

## 2022-03-24 MED ORDER — SODIUM CHLORIDE 0.9 % IV SOLN
1.0000 g | INTRAVENOUS | Status: DC
  Administered 2022-03-24 – 2022-03-25 (×2): 1 g via INTRAVENOUS
  Filled 2022-03-24 (×2): qty 10

## 2022-03-24 NOTE — Hospital Course (Signed)
Cindy Brooks is a 51 yo female with PMH recent CVA, HTN who presented after a fall at home.  She was recently discharged on 03/21/2022 after a prolonged hospitalization with original admit date on 01/20/2022.  She was treated for acute bilateral thalamic, midbrain, and bilateral cerebral infarctions.  She required tracheostomy and PEG tube placement.  She had progressive clinical improvement and was able to be decannulated on 03/01/2022.  Diet was also slowly advanced and tube feeds were discontinued.  She tolerated dysphagia level 2 diet prior to discharge. Patient able to communicate with short sentences at baseline prior to discharge and was unable to do so at home per family, therefore was brought back to the hospital. After evaluation by neurology in the ER, concern was for possible recrudescence of her stroke in setting of possible UTI. Urinalysis was noted with LE, nitrite positive, greater than 50 WBC.  She was started on Rocephin.

## 2022-03-24 NOTE — Assessment & Plan Note (Addendum)
-   Diagnosed with initial CVA on 01/22/2022 showing bilateral thalamic and midbrain infarctions. She was intubated and required trach/PEG and was advanced to dysphagia 2 diet and decannulated from trach -Continue Plavix and Lipitor -Discharge summary notes that she was oriented to self, follows commands, and capable of feeding herself with supervision

## 2022-03-24 NOTE — Consult Note (Addendum)
Stroke Neurology Consultation Note  Consult Requested by: Dr. Hal Hope  Reason for Consult: hx of stroke  Consult Date: 03/24/22   The history was obtained from the chart and EDP.  During history and examination, all items were not able to obtain unless otherwise noted.  History of Present Illness:  Cindy Brooks is a 51 y.o. African American female with PMH of HTN, TIA, ETOH abuse, stroke in 01/2022 with prolonged hospital stay and medication noncompliance was sent to ED for tachycardia.  However, family reported that patient was able to speak in short sentences at baseline but today she was not talking.  Time onset not clear. MRI brain showed no acute finding beside chronic infarcts.  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  Her ANA also positive twice.  CTV no venous sinus thrombosis, CTA head and neck severe bilateral P1 and right P2 stenosis and proximal left M2 stenosis and severe right ACA stenosis, 40% stenosis left ICA.  She had a prolonged hospital stay due to encephalopathy, status post tracheostomy and PEG, and was discharged home on 03/21/2022 with Plavix and Crestor and amantadine.    Past Medical History:  Diagnosis Date   Hypertension    Hypertensive urgency 01/20/2022   Seizure ([place]) 01/28/2022   TIA (transient ischemic attack) 2019   Urinary tract infection 01/28/2022    Past Surgical History:  Procedure Laterality Date   ESOPHAGOGASTRODUODENOSCOPY N/A 01/28/2022   Procedure: ESOPHAGOGASTRODUODENOSCOPY (EGD);  Surgeon: Jesusita Oka, MD;  Location: [facility] ENDOSCOPY;  Service: General;  Laterality: N/A;   PEG PLACEMENT N/A 01/28/2022   Procedure: PERCUTANEOUS ENDOSCOPIC GASTROSTOMY (PEG) PLACEMENT;  Surgeon: Jesusita Oka, MD;   Location: [facility] ENDOSCOPY;  Service: General;  Laterality: N/A;    Family History  Problem Relation Age of Onset   Diabetes Mellitus II Maternal Grandmother     Social History:  reports that she has never smoked. She has never used smokeless tobacco. She reports current alcohol use of about 203.0 standard drinks of alcohol per week. She reports that she does not use drugs.  Allergies:  Allergies  Allergen Reactions   Aspirin Hives and Rash   Codeine Hives and Rash   Penicillins Anaphylaxis, Swelling and Other (See Comments)   Strawberry Extract Anaphylaxis and Hives    No current facility-administered medications on file prior to encounter.   Current Outpatient Medications on File Prior to Encounter  Medication Sig Dispense Refill   acetaminophen (TYLENOL) 325 MG tablet Take 2 tablets (650 mg total) by mouth every 4 (four) hours as needed. 100 tablet 2   amantadine (SYMMETREL) 100 MG capsule Take 1 capsule (100 mg total) by mouth 2 (two) times daily. 60 capsule 1   amLODipine (NORVASC) 10 MG tablet Take 1 tablet (10 mg total) by mouth daily. 30 tablet 1   atorvastatin (LIPITOR) 80 MG tablet Take 1 tablet (80 mg total) by mouth daily. 90 tablet 1   clopidogrel (PLAVIX) 75 MG tablet Take 1 tablet (75 mg total) by mouth daily. 30 tablet 1   feeding supplement (ENSURE ENLIVE / ENSURE PLUS) LIQD Take 237 mLs by mouth 3 (three) times daily between meals. 123XX123 mL 0   folic acid (FOLVITE) 1 MG tablet Take 1 tablet (1 mg total) by mouth daily. 30 tablet 1   gabapentin (NEURONTIN)  100 MG capsule Take 1 capsule (100 mg total) by mouth 2 (two) times daily. 60 capsule 1   glycopyrrolate (ROBINUL) 1 MG tablet Take 0.5 tablets (0.5 mg total) by mouth 2 (two) times daily. 30 tablet 0   hydrALAZINE (APRESOLINE) 50 MG tablet Take 1 tablet (50 mg total) by mouth every 8 (eight) hours. 90 tablet 1   ipratropium-albuterol (DUONEB) 0.5-2.5 (3) MG/3ML SOLN Take 3 mLs by nebulization every 6 (six) hours as  needed. (Patient taking differently: Take 3 mLs by nebulization every 6 (six) hours as needed (shortness of breath, wheezing).) 360 mL 1   Multiple Vitamin (MULTIVITAMIN WITH MINERALS) TABS tablet Take 1 tablet by mouth daily.     Nystatin (GERHARDT'S BUTT CREAM) CREA Apply 1 application. topically daily as needed for irritation.     polyethylene glycol powder (MIRALAX) 17 GM/SCOOP powder Take 17 g by mouth 2 (two) times daily as needed for moderate constipation. 255 g 0   propranolol (INDERAL) 40 MG tablet Take 1 tablet (40 mg total) by mouth 3 (three) times daily. 180 tablet 0   senna-docusate (SENOKOT-S) 8.6-50 MG tablet Take 1 tablet by mouth 2 (two) times daily between meals as needed for mild constipation. 180 tablet 0   thiamine 100 MG tablet Take 1 tablet (100 mg total) by mouth daily. 30 tablet 1    Review of Systems: A full ROS was attempted today and was not able to be performed due to nonverbal.  Physical Examination: Temp:  [97.5 F (36.4 C)-99.4 F (37.4 C)] 99.4 F (37.4 C) (06/10 1447) Pulse Rate:  [106-135] 109 (06/10 2200) Resp:  [23-36] 32 (06/10 2200) BP: (140-185)/(95-136) 140/95 (06/10 2200) SpO2:  [93 %-100 %] 96 % (06/10 2200) Weight:  [79.2 kg] 79.2 kg (06/10 1431)  General - well nourished, well developed, in no apparent distress. However, with b/l leg passive movement, pt apparently had leg spasm with acute distress due to pain and her HR went up.     Ophthalmologic - fundi not visualized due to noncooperation.    Cardiovascular - regular rhythm and rate  Neuro - awake with eyes open, nonverbal except said "nose" for naming. However, she is able to follow most simple commands, such as open/close eyes, showing fingers and wiggle toes. Chronic left eye lateral gaze, right eye INO, bilateral pupils 55mm fixed, left ptosis. Right mild facial droop, blinking to visual threat bilaterally. Tongue protrusion not able to perform. Bilateral UEs 3/5, no drift on the right.  RLE 3-/5 and LLE 2-/5. Sensation, coordination not cooperative and gait not tested.   Data Reviewed: MR BRAIN WO CONTRAST  Result Date: 03/23/2022 CLINICAL DATA:  Mental status change EXAM: MRI HEAD WITHOUT CONTRAST TECHNIQUE: Multiplanar, multiecho pulse sequences of the brain and surrounding structures were obtained without intravenous contrast. COMPARISON:  Brain MRI 01/30/2022 FINDINGS: Brain: Resolving diffusion abnormality in the right corona radiata. No acute abnormality. Multiple chronic microhemorrhages in a predominantly central distribution. There are old infarcts of the left basal and both thalami. The midline structures are normal. Vascular: Major flow voids are preserved. Skull and upper cervical spine: Normal calvarium and skull base. Visualized upper cervical spine and soft tissues are normal. Sinuses/Orbits:Partial opacification of the left maxillary and sphenoid sinuses. Normal orbits. IMPRESSION: 1. No acute intracranial abnormality. 2. Resolving diffusion abnormality in the right corona radiata from recent infarct. 3. Multiple chronic microhemorrhages in a predominantly central distribution, consistent with chronic hypertensive angiopathy. Electronically Signed   By: Cletus Gash.D.  On: 03/23/2022 23:44   CT Angio Chest PE W and/or Wo Contrast  Result Date: 03/23/2022 CLINICAL DATA:  Recent fall. EXAM: CT ANGIOGRAPHY CHEST WITH CONTRAST TECHNIQUE: Multidetector CT imaging of the chest was performed using the standard protocol during bolus administration of intravenous contrast. Multiplanar CT image reconstructions and MIPs were obtained to evaluate the vascular anatomy. RADIATION DOSE REDUCTION: This exam was performed according to the departmental dose-optimization program which includes automated exposure control, adjustment of the mA and/or kV according to patient size and/or use of iterative reconstruction technique. CONTRAST:  61mL OMNIPAQUE IOHEXOL 350 MG/ML SOLN COMPARISON:   February 04, 2022 FINDINGS: Cardiovascular: Satisfactory opacification of the pulmonary arteries to the segmental level. No evidence of pulmonary embolism. There is mild to moderate severity cardiomegaly. A small to moderate sized pericardial effusion is seen (maximum thickness of 1.3 cm in AP measurement). This is increased in size when compared to the prior exam. Mediastinum/Nodes: No enlarged mediastinal, hilar, or axillary lymph nodes. Thyroid gland, trachea, and esophagus demonstrate no significant findings. Lungs/Pleura: Mild, hazy atelectatic changes are seen within the bilateral upper lobes and bilateral lower lobes. There is no evidence of a pleural effusion or pneumothorax. Upper Abdomen: No acute abnormality. Musculoskeletal: No chest wall abnormality. No acute or significant osseous findings. Review of the MIP images confirms the above findings. IMPRESSION: 1. No evidence of pulmonary embolism. 2. Mild to moderate severity cardiomegaly with a small to moderate sized pericardial effusion. 3. Mild, hazy bilateral upper lobe and bilateral lower lobe atelectasis. Electronically Signed   By: Virgina Norfolk M.D.   On: 03/23/2022 19:38   CT HEAD WO CONTRAST  Result Date: 03/23/2022 CLINICAL DATA:  Neurological deficit EXAM: CT HEAD WITHOUT CONTRAST TECHNIQUE: Contiguous axial images were obtained from the base of the skull through the vertex without intravenous contrast. RADIATION DOSE REDUCTION: This exam was performed according to the departmental dose-optimization program which includes automated exposure control, adjustment of the mA and/or kV according to patient size and/or use of iterative reconstruction technique. COMPARISON:  Previous studies including MR brain done on 01/30/2022 and CT done on 01/22/2022 FINDINGS: Brain: No acute intracranial findings are seen. There are no signs of bleeding within the cranium. Cavum septum vergae and cavum septum pellucidum are noted. Ventricles are not dilated.  There are old lacunar infarcts in basal ganglia on both sides with no significant interval change. Vascular: Scattered arterial calcifications are seen. Skull: Unremarkable. Sinuses/Orbits: Unremarkable. Other: None IMPRESSION: No acute intracranial findings are seen in noncontrast CT brain. Electronically Signed   By: Elmer Picker M.D.   On: 03/23/2022 15:42   DG Chest Port 1 View  Result Date: 03/23/2022 CLINICAL DATA:  Tachycardia EXAM: PORTABLE CHEST 1 VIEW COMPARISON:  02/20/2022 FINDINGS: Cardiomegaly. Both lungs are clear. The visualized skeletal structures are unremarkable. IMPRESSION: Cardiomegaly without acute abnormality of the lungs in AP portable projection. Electronically Signed   By: Delanna Ahmadi M.D.   On: 03/23/2022 15:32   DG Knee Left Port  Result Date: 03/12/2022 CLINICAL DATA:  Left knee pain. EXAM: PORTABLE LEFT KNEE - 1-2 VIEW COMPARISON:  None Available. FINDINGS: Significant age advanced tricompartmental degenerative changes most notable involving the medial compartment. There is joint space narrowing, osteophytic spurring and subchondral cystic change. No acute bony findings.  Very small joint effusion. 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 No acute bony findings. Electronically Signed   By: Marijo Sanes M.D.   On: 03/12/2022 13:04   DG Swallowing Func-Speech Pathology  Result  Date: 02/28/2022 Table formatting from the original result was not included. Objective Swallowing Evaluation: Type of Study: MBS-Modified Barium Swallow Study  Patient Details Name: Cindy Brooks MRN: [id number] Date of Birth: 08/09/1971 Today's Date: 02/28/2022 Time: SLP Start Time (ACUTE ONLY): [id number] -SLP Stop Time (ACUTE ONLY): [id number] SLP Time Calculation (min) (ACUTE ONLY): 30 min Past Medical History: Past Medical History: Diagnosis Date  Hypertension   Hypertensive urgency 01/20/2022  Seizure ([place]) 01/28/2022  TIA (transient ischemic attack) 2019  Urinary tract  infection 01/28/2022 Past Surgical History: Past Surgical History: Procedure Laterality Date  ESOPHAGOGASTRODUODENOSCOPY N/A 01/28/2022  Procedure: ESOPHAGOGASTRODUODENOSCOPY (EGD);  Surgeon: Jesusita Oka, MD;  Location: [facility] ENDOSCOPY;  Service: General;  Laterality: N/A;  PEG PLACEMENT N/A 01/28/2022  Procedure: PERCUTANEOUS ENDOSCOPIC GASTROSTOMY (PEG) PLACEMENT;  Surgeon: Jesusita Oka, MD;  Location: [facility] ENDOSCOPY;  Service: General;  Laterality: N/A; HPI: Pt is a 51 y/o female who presented to the ED after being found unresponsive in her care. Pt admitted 4/9 with thalamic stroke, treated for possible acute metabolic and wernicke's encphalopathy as well. She required intubation for airway protection on admission and had a tracheostomy placed on 4/16.  PEG on 4/18. MRI 4/19: Multiple foci of restricted diffusion in the bilateral frontal lobes and parietal lobes primarily. Started tolerating ATC on 4/22. Has suffered from severe encephalopathy, neurostorming. Started following commands around 5/15. A time of evaluation, pt with a 4 cuffless trach which is capped and may be decannulated within the next 24-48 hours. PMH: HTN, TIA, ETOH abuse and medication noncompliance.  No data recorded  Recommendations for follow up therapy are one component of a multi-disciplinary discharge planning process, led by the attending physician.  Recommendations may be updated based on patient status, additional functional criteria and insurance authorization. Assessment / Plan / Recommendation   02/28/2022  12:38 PM Clinical Impressions Clinical Impression Pt was lethargic throughout the study despite sternal rub and thermotactile stimulation with cold washcloth, and pt exhibited difficulty maintaining an adequate level of alertness once improved alertness was achieved. SLP suspects that this, combined with the flavor the barium, likely impacted pt's performance during the study. However, the study was somewhat limited due to pt's  lethargy. She exhibited worse bolus awareness than has been exhibited at bedside, and significant oral holding was noted with purees and thin liquids. Oral suctioning was necessary once due to the extent of oral holding. Minimal premature spillage to the valleculae was noted with a thin liquid bolus via straw; however, pt was otherwise able to contain large liquid boluses in the oral cavity with no instances of premature spillage even when a posterior head tilt was facilitated. With verbal and tactile prompts and cues, once the swallow was triggered, pharyngeal clearance was WNL and no instances of penetration/aspiration were demonstrated. Considering pt's current lethargy, diet initiation will be deferred today. However, once pt's presentation is more similar to that noted this morning in treatment, a dysphagia 1 diet with thin liquids will likely be initiated. SLP Visit Diagnosis Dysphagia, oral phase (R13.11) Impact on safety and function Mild aspiration risk     02/28/2022  12:38 PM Treatment Recommendations Treatment Recommendations Therapy as outlined in treatment plan below     02/28/2022  12:38 PM Prognosis Prognosis for Safe Diet Advancement Good Barriers to Reach Goals Cognitive deficits;Language deficits;Time post onset   02/28/2022  12:38 PM Diet Recommendations SLP Diet Recommendations Dysphagia 1 (Puree) solids;Thin liquid Liquid Administration via Cup;Straw Medication Administration Via alternative means Compensations Slow rate;Small sips/bites;Minimize environmental  distractions Postural Changes Seated upright at 90 degrees     02/28/2022  12:38 PM Other Recommendations Oral Care Recommendations Oral care BID Follow Up Recommendations Skilled nursing-short term rehab (<3 hours/day) Assistance recommended at discharge Frequent or constant Supervision/Assistance Functional Status Assessment Patient has had a recent decline in their functional status and demonstrates the ability to make significant  improvements in function in a reasonable and predictable amount of time.   02/28/2022  12:38 PM Frequency and Duration  Speech Therapy Frequency (ACUTE ONLY) min 2x/week Treatment Duration 2 weeks     02/28/2022  12:38 PM Oral Phase Oral Phase Impaired Oral - Thin Cup Delayed oral transit;Holding of bolus Oral - Thin Straw Delayed oral transit;Holding of bolus Oral - Puree Delayed oral transit;Holding of bolus    02/28/2022  12:38 PM Pharyngeal Phase Pharyngeal Phase [facility]    02/28/2022  12:38 PM Cervical Esophageal Phase  Cervical Esophageal Phase [facility] Shanika I. Hardin Negus, [place], [place] Office number [phone] Pager [phone] Horton Marshall 02/28/2022, 1:51 PM                      Assessment: 51 y.o. female with PMH of HTN, TIA, ETOH abuse, artery of Percheron strokes involving bilateral thalami and midbrain in 01/2022 with prolonged hospital stay and just discharged home 2 days ago was sent to ED for tachycardia. Per EDP, pt at baseline was able to speak in short words but today she was not talking.  Time onset not clear. Had MRI brain done showed no acute finding but chronic infarcts. On my exam, pt was able to say "nose" for naming and was able to follow simple commands. She is close to her baseline but likely to have recrudescence of her previous stroke symptoms due to medical condition such as encephalopathy, tachycardia, off home meds. Her tachycardia could be related to off home propranolol, pain from LE spasm, etc. Recommend to resume home meds, encephalopathy work up and supportive care. Seizure less likely given she follows commands, close to baseline and no seizure like activities.   Plan: - continue encephalopathy work up to treat underlying disease - resume home meds especially beta blocker - MRI negative for new stroke - supportive care - neurology will sign off for now. Please call with questions.  - follow up with Dr. Leonie Man at [facility] stroke clinic in 4 weeks.    Thank you for this consultation and allowing [place] to participate in the care of this patient.  Rosalin Hawking, MD PhD Stroke Neurology 03/24/2022 3:49 AM

## 2022-03-24 NOTE — Assessment & Plan Note (Signed)
-   likely multifactorial as she already had poor mentation at discharge with low level functioning baseline in background of rather significant B/L CVA in April 2023, and now with probable superimposed UTI causing some recrudescence and/or worsening mentation with poor reserve - treating UTI and monitor mentation response; has improved back to baseline -No epileptiform abnormalities noted on EEG

## 2022-03-24 NOTE — Procedures (Signed)
Routine EEG Report  Cindy Brooks is a 51 y.o. female with a history of speech impairment and encephalopathy who is undergoing an EEG to evaluate for seizures.  Report: This EEG was acquired with electrodes placed according to the International 10-20 electrode system (including Fp1, Fp2, F3, F4, C3, C4, P3, P4, O1, O2, T3, T4, T5, T6, A1, A2, Fz, Cz, Pz). The following electrodes were missing or displaced: none.  The occipital dominant rhythm was 7 Hz. This activity is reactive to stimulation. Drowsiness was manifested by background fragmentation; deeper stages of sleep were identified by K complexes and sleep spindles. There was no focal slowing. There were no interictal epileptiform discharges. There were no electrographic seizures identified. Photic stimulation and hyperventilation were not performed.  Impression and clinical correlation: This EEG was obtained while awake and asleep and is abnormal due to mild diffuse slowing indicative of global cerebral dysfunction. Epileptiform abnormalities were not seen during this recording.  Bing Neighbors, MD [facility] [phone]  If 7pm- 7am, please page neurology on call as listed in AMION.

## 2022-03-24 NOTE — Assessment & Plan Note (Signed)
-   Failed voiding trials during hospitalization and was discharged with indwelling Foley

## 2022-03-24 NOTE — Assessment & Plan Note (Signed)
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

## 2022-03-24 NOTE — H&P (Signed)
History and Physical    [name] WLN:[id number] DOB: 1970-11-14 DOA: 03/23/2022  PCP: Pcp, No  Patient coming from: Home.  Chief Complaint: Nonmobile.  HPI: Cindy Brooks is a 51 y.o. female with history of recent admission for CVA and metabolic encephalopathy, hypertension who had trach and PEG placed eventually extubated was discharged home about 3 days ago was found to be nonverbal as noticed by patient's daughters.  Per report patient also had a fall.  Patient at the time of discharge was communicating with short sentences which family felt that she has stopped doing that.  Has not taken her medicine since discharge 3 days ago since the medicine refill was just available history as per the report.  In addition patient was found to have persistently tachycardic.  ED Course: In the ER patient was found to be tachycardic.  Temperature of 99.4 no leukocytosis high sensitive troponins were 22 and 22 MRI brain does not show any acute.  Neurologist on-call Dr. Roda Shutters evaluated the patient and felt that patient's neurological status is at around baseline but her speech may be due to recrudescence of her symptoms possibly precipitated by UTI or other metabolic reasons.  Patient admitted for further observation.  Review of Systems: As per HPI, rest all negative.   Past Medical History:  Diagnosis Date   Hypertension    Hypertensive urgency 01/20/2022   Seizure (HCC) 01/28/2022   TIA (transient ischemic attack) 2019   Urinary tract infection 01/28/2022    Past Surgical History:  Procedure Laterality Date   ESOPHAGOGASTRODUODENOSCOPY N/A 01/28/2022   Procedure: ESOPHAGOGASTRODUODENOSCOPY (EGD);  Surgeon: Diamantina Monks, MD;  Location: [facility] ENDOSCOPY;  Service: General;  Laterality: N/A;   PEG PLACEMENT N/A 01/28/2022   Procedure: PERCUTANEOUS ENDOSCOPIC GASTROSTOMY (PEG) PLACEMENT;  Surgeon: Diamantina Monks, MD;  Location: [facility] ENDOSCOPY;  Service: General;  Laterality: N/A;     reports that she  has never smoked. She has never used smokeless tobacco. She reports current alcohol use of about 203.0 standard drinks of alcohol per week. She reports that she does not use drugs.  Allergies  Allergen Reactions   Aspirin Hives and Rash   Codeine Hives and Rash   Penicillins Anaphylaxis, Swelling and Other (See Comments)   Strawberry Extract Anaphylaxis and Hives    Family History  Problem Relation Age of Onset   Diabetes Mellitus II Maternal Grandmother     Prior to Admission medications   Medication Sig Start Date End Date Taking? Authorizing Provider  acetaminophen (TYLENOL) 325 MG tablet Take 2 tablets (650 mg total) by mouth every 4 (four) hours as needed. 03/21/22 03/21/23 Yes Almon Hercules, MD  amantadine (SYMMETREL) 100 MG capsule Take 1 capsule (100 mg total) by mouth 2 (two) times daily. 03/21/22 05/20/22 Yes Almon Hercules, MD  amLODipine (NORVASC) 10 MG tablet Take 1 tablet (10 mg total) by mouth daily. 03/21/22  Yes Almon Hercules, MD  atorvastatin (LIPITOR) 80 MG tablet Take 1 tablet (80 mg total) by mouth daily. 03/21/22  Yes Almon Hercules, MD  clopidogrel (PLAVIX) 75 MG tablet Take 1 tablet (75 mg total) by mouth daily. 03/21/22  Yes Almon Hercules, MD  feeding supplement (ENSURE ENLIVE / ENSURE PLUS) LIQD Take 237 mLs by mouth 3 (three) times daily between meals. 03/21/22 04/20/22 Yes Almon Hercules, MD  folic acid (FOLVITE) 1 MG tablet Take 1 tablet (1 mg total) by mouth daily. 03/21/22  Yes Almon Hercules, MD  gabapentin (NEURONTIN) 100  MG capsule Take 1 capsule (100 mg total) by mouth 2 (two) times daily. 03/21/22  Yes Almon HerculesGonfa, Taye T, MD  glycopyrrolate (ROBINUL) 1 MG tablet Take 0.5 tablets (0.5 mg total) by mouth 2 (two) times daily. 03/21/22  Yes Almon HerculesGonfa, Taye T, MD  hydrALAZINE (APRESOLINE) 50 MG tablet Take 1 tablet (50 mg total) by mouth every 8 (eight) hours. 03/21/22 05/20/22 Yes Gonfa, Boyce Mediciaye T, MD  ipratropium-albuterol (DUONEB) 0.5-2.5 (3) MG/3ML SOLN Take 3 mLs by nebulization every 6 (six)  hours as needed. Patient taking differently: Take 3 mLs by nebulization every 6 (six) hours as needed (shortness of breath, wheezing). 03/21/22  Yes Almon HerculesGonfa, Taye T, MD  Multiple Vitamin (MULTIVITAMIN WITH MINERALS) TABS tablet Take 1 tablet by mouth daily. 03/21/22  Yes Almon HerculesGonfa, Taye T, MD  Nystatin (GERHARDT'S BUTT CREAM) CREA Apply 1 application. topically daily as needed for irritation. 03/21/22  Yes Almon HerculesGonfa, Taye T, MD  polyethylene glycol powder (MIRALAX) 17 GM/SCOOP powder Take 17 g by mouth 2 (two) times daily as needed for moderate constipation. 03/21/22  Yes Almon HerculesGonfa, Taye T, MD  propranolol (INDERAL) 40 MG tablet Take 1 tablet (40 mg total) by mouth 3 (three) times daily. 03/21/22 05/20/22 Yes Gonfa, Boyce Mediciaye T, MD  senna-docusate (SENOKOT-S) 8.6-50 MG tablet Take 1 tablet by mouth 2 (two) times daily between meals as needed for mild constipation. 03/21/22  Yes Almon HerculesGonfa, Taye T, MD  thiamine 100 MG tablet Take 1 tablet (100 mg total) by mouth daily. 03/21/22  Yes Almon HerculesGonfa, Taye T, MD    Physical Exam: Constitutional: Moderately built and nourished. Vitals:   03/23/22 1910 03/23/22 2100 03/23/22 2130 03/23/22 2200  BP: (!) 151/100 (!) 182/110 (!) 143/98 (!) 140/95  Pulse: (!) 106 (!) 119 (!) 109 (!) 109  Resp: (!) 31 (!) 34 (!) 24 (!) 32  Temp:      TempSrc:      SpO2: 96% 96% 93% 96%  Weight:      Height:       Eyes: Anicteric no pallor. ENMT: No discharge from the ears eyes nose and mouth. Neck: No mass felt.  No neck rigidity. Respiratory: No rhonchi or crepitations. Cardiovascular: S1-S2 heard. Abdomen: Soft nontender bowel sound present. Musculoskeletal: Pain on moving the lower extremities. Skin: No rash. Neurologic: Alert awake responds to her name.  Weak on the right side. Psychiatric: Responds to her name.   Labs on Admission: I have personally reviewed following labs and imaging studies  CBC: Recent Labs  Lab 03/23/22 1654  WBC 9.3  NEUTROABS 6.4  HGB 12.0  HCT 36.3  MCV 80.7  PLT 373    Basic Metabolic Panel: Recent Labs  Lab 03/23/22 1654  NA 142  K 3.6  CL 107  CO2 21*  GLUCOSE 92  BUN 11  CREATININE 0.96  CALCIUM 9.9  MG 2.1   GFR: Estimated Creatinine Clearance: 71.4 mL/min (by C-G formula based on SCr of 0.96 mg/dL). Liver Function Tests: Recent Labs  Lab 03/23/22 1654  AST 36  ALT 72*  ALKPHOS 109  BILITOT 0.9  PROT 8.0  ALBUMIN 3.9   No results for input(s): "LIPASE", "AMYLASE" in the last 168 hours. No results for input(s): "AMMONIA" in the last 168 hours. Coagulation Profile: Recent Labs  Lab 03/23/22 1654  INR 1.2   Cardiac Enzymes: No results for input(s): "CKTOTAL", "CKMB", "CKMBINDEX", "TROPONINI" in the last 168 hours. BNP (last 3 results) No results for input(s): "PROBNP" in the last 8760 hours. HbA1C: No results  for input(s): "HGBA1C" in the last 72 hours. CBG: Recent Labs  Lab 03/20/22 1934 03/20/22 2329 03/21/22 0759 03/21/22 1130 03/24/22 0123  GLUCAP 110* 124* 100* 121* 114*   Lipid Profile: No results for input(s): "CHOL", "HDL", "LDLCALC", "TRIG", "CHOLHDL", "LDLDIRECT" in the last 72 hours. Thyroid Function Tests: Recent Labs    03/23/22 1654  FREET4 1.13*   Anemia Panel: No results for input(s): "VITAMINB12", "FOLATE", "FERRITIN", "TIBC", "IRON", "RETICCTPCT" in the last 72 hours. Urine analysis:    Component Value Date/Time   COLORURINE YELLOW 03/23/2022 2205   APPEARANCEUR CLOUDY (A) 03/23/2022 2205   LABSPEC 1.016 03/23/2022 2205   PHURINE 9.0 (H) 03/23/2022 2205   GLUCOSEU NEGATIVE 03/23/2022 2205   HGBUR NEGATIVE 03/23/2022 2205   BILIRUBINUR NEGATIVE 03/23/2022 2205   KETONESUR 20 (A) 03/23/2022 2205   PROTEINUR 100 (A) 03/23/2022 2205   NITRITE POSITIVE (A) 03/23/2022 2205   LEUKOCYTESUR LARGE (A) 03/23/2022 2205   Sepsis Labs: (procalcitonin:4,lacticidven:4) )No results found for this or any previous visit (from the past 240 hour(s)).   Radiological Exams on Admission: MR  BRAIN WO CONTRAST  Result Date: 03/23/2022 CLINICAL DATA:  Mental status change EXAM: MRI HEAD WITHOUT CONTRAST TECHNIQUE: Multiplanar, multiecho pulse sequences of the brain and surrounding structures were obtained without intravenous contrast. COMPARISON:  Brain MRI 01/30/2022 FINDINGS: Brain: Resolving diffusion abnormality in the right corona radiata. No acute abnormality. Multiple chronic microhemorrhages in a predominantly central distribution. There are old infarcts of the left basal and both thalami. The midline structures are normal. Vascular: Major flow voids are preserved. Skull and upper cervical spine: Normal calvarium and skull base. Visualized upper cervical spine and soft tissues are normal. Sinuses/Orbits:Partial opacification of the left maxillary and sphenoid sinuses. Normal orbits. IMPRESSION: 1. No acute intracranial abnormality. 2. Resolving diffusion abnormality in the right corona radiata from recent infarct. 3. Multiple chronic microhemorrhages in a predominantly central distribution, consistent with chronic hypertensive angiopathy. Electronically Signed   By: Deatra Robinson M.D.   On: 03/23/2022 23:44   CT Angio Chest PE W and/or Wo Contrast  Result Date: 03/23/2022 CLINICAL DATA:  Recent fall. EXAM: CT ANGIOGRAPHY CHEST WITH CONTRAST TECHNIQUE: Multidetector CT imaging of the chest was performed using the standard protocol during bolus administration of intravenous contrast. Multiplanar CT image reconstructions and MIPs were obtained to evaluate the vascular anatomy. RADIATION DOSE REDUCTION: This exam was performed according to the departmental dose-optimization program which includes automated exposure control, adjustment of the mA and/or kV according to patient size and/or use of iterative reconstruction technique. CONTRAST:  75mL OMNIPAQUE IOHEXOL 350 MG/ML SOLN COMPARISON:  February 04, 2022 FINDINGS: Cardiovascular: Satisfactory opacification of the pulmonary arteries to the  segmental level. No evidence of pulmonary embolism. There is mild to moderate severity cardiomegaly. A small to moderate sized pericardial effusion is seen (maximum thickness of 1.3 cm in AP measurement). This is increased in size when compared to the prior exam. Mediastinum/Nodes: No enlarged mediastinal, hilar, or axillary lymph nodes. Thyroid gland, trachea, and esophagus demonstrate no significant findings. Lungs/Pleura: Mild, hazy atelectatic changes are seen within the bilateral upper lobes and bilateral lower lobes. There is no evidence of a pleural effusion or pneumothorax. Upper Abdomen: No acute abnormality. Musculoskeletal: No chest wall abnormality. No acute or significant osseous findings. Review of the MIP images confirms the above findings. IMPRESSION: 1. No evidence of pulmonary embolism. 2. Mild to moderate severity cardiomegaly with a small to moderate sized pericardial effusion. 3. Mild, hazy bilateral upper lobe and  bilateral lower lobe atelectasis. Electronically Signed   By: Aram Candela M.D.   On: 03/23/2022 19:38   CT HEAD WO CONTRAST  Result Date: 03/23/2022 CLINICAL DATA:  Neurological deficit EXAM: CT HEAD WITHOUT CONTRAST TECHNIQUE: Contiguous axial images were obtained from the base of the skull through the vertex without intravenous contrast. RADIATION DOSE REDUCTION: This exam was performed according to the departmental dose-optimization program which includes automated exposure control, adjustment of the mA and/or kV according to patient size and/or use of iterative reconstruction technique. COMPARISON:  Previous studies including MR brain done on 01/30/2022 and CT done on 01/22/2022 FINDINGS: Brain: No acute intracranial findings are seen. There are no signs of bleeding within the cranium. Cavum septum vergae and cavum septum pellucidum are noted. Ventricles are not dilated. There are old lacunar infarcts in basal ganglia on both sides with no significant interval change.  Vascular: Scattered arterial calcifications are seen. Skull: Unremarkable. Sinuses/Orbits: Unremarkable. Other: None IMPRESSION: No acute intracranial findings are seen in noncontrast CT brain. Electronically Signed   By: Ernie Avena M.D.   On: 03/23/2022 15:42   DG Chest Port 1 View  Result Date: 03/23/2022 CLINICAL DATA:  Tachycardia EXAM: PORTABLE CHEST 1 VIEW COMPARISON:  02/20/2022 FINDINGS: Cardiomegaly. Both lungs are clear. The visualized skeletal structures are unremarkable. IMPRESSION: Cardiomegaly without acute abnormality of the lungs in AP portable projection. Electronically Signed   By: Jearld Lesch M.D.   On: 03/23/2022 15:32    EKG: Independently reviewed.  Sinus tachycardia.  Assessment/Plan Principal Problem:   Aphasia Active Problems:   Acute metabolic encephalopathy   Antiphospholipid antibody with hypercoagulable state (HCC)   Uncontrolled hypertension   CVA (cerebral vascular accident) (HCC)   Acute encephalopathy    Acute metabolic encephalopathy with patient being nonverbal likely recrudescence of her symptoms in the setting of possible UTI and metabolic reasons as discussed with neurologist Dr. Roda Shutters.  We will continue to monitor. Possible UTI on ceftriaxone follow urine cultures. Hypertension uncontrolled likely from patient not taking her medications last 3 days.  We will restart her medication through PEG tube.  Once patient can reliably take orally we will switch to oral medication.  As needed IV labetalol for systolic blood pressure more than 160. Tachycardia is likely from patient missing her Inderal.  Which has been ordered through PEG tube.  We will check TSH D-dimer. Fall as per the report.  Will check x-ray pelvis. Recent stroke on antiplatelet agents and statins. Prior history of alcohol use on thiamine.   DVT prophylaxis: Lovenox. Code Status: Full code. Family Communication: Try to reach both daughters unable to at this time. Disposition  Plan: Home when stable. Consults called: Discussed with neurologist. Admission status: Observation.   Eduard Clos MD [facility] Pager [phone].  If 7PM-7AM, please contact night-coverage [email or web address] Password [facility]  03/24/2022, 1:34 AM

## 2022-03-24 NOTE — Assessment & Plan Note (Signed)
-   Patient was able to be weaned off tube feeds and transitioned to dysphagia level 2 diet -Did not pass swallow eval on 6/12 but passed well on 03/26/2022.  Therefore, avoid reinitiating tube feeds and will transition back to dysphagia level 2 diet discharge

## 2022-03-24 NOTE — Progress Notes (Signed)
Progress Note    Pinki Biviano   [id number]  DOB: 22-Jul-1971  DOA: 03/23/2022     0 PCP: Pcp, No  Initial CC: talking less  Hospital Course: Ms. Kucera is a 51 yo female with PMH recent CVA, HTN who presented after a fall at home.  She was recently discharged on 03/21/2022 after a prolonged hospitalization with original admit date on 01/20/2022.  She was treated for acute bilateral thalamic, midbrain, and bilateral cerebral infarctions.  She required tracheostomy and PEG tube placement.  She had progressive clinical improvement and was able to be decannulated on 03/01/2022.  Diet was also slowly advanced and tube feeds were discontinued.  She tolerated dysphagia level 2 diet prior to discharge. Patient able to communicate with short sentences at baseline prior to discharge and was unable to do so at home per family, therefore was brought back to the hospital. After evaluation by neurology in the ER, concern was for possible recrudescence of her stroke in setting of possible UTI. Urinalysis was noted with LE, nitrite positive, greater than 50 WBC.  She was started on Rocephin.  Interval History:  Unresponsive and noninteractive when seen this morning in the ER.  No family present.  She would open eyes mildly to sternal rub.  Unable to follow any commands.  Assessment and Plan: * Acute metabolic encephalopathy - likely multifactorial as she already had poor mentation at discharge with low level functioning baseline in background of rather significant B/L CVA in April 2023, and now with probable superimposed UTI causing some recrudescence and/or worsening mentation with poor reserve - treating UTI and monitor mentation response - discussed with neurology, will also obtain EEG  - follow up urine and blood cultures  UTI (urinary tract infection) - UA noted with large LE, positive nitrite, greater than 50 WBC - Follow-up urine culture - Continue Rocephin  CVA (cerebral vascular accident)  ([place]) - Diagnosed with initial CVA on 01/22/2022 showing bilateral thalamic and midbrain infarctions. 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 rate (!) 22, height 5\' 4"  (1.626 m), weight 79.2 kg, SpO2 98 %.  Examination:  Physical Exam Constitutional:      Comments: Nonresponsive, noninteractive, nonverbal. Opens eyes to sternal rub.  Barely moving upper extremities non purposefully; appearance mildly unkempt and mildly malodorous  HENT:     Head: Normocephalic and atraumatic.     Mouth/Throat:     Mouth: Mucous membranes are dry.  Eyes:     Comments: Fixed and dilated approximately 4 mm  bilaterally  Cardiovascular:     Rate and Rhythm: Normal rate and regular rhythm.  Pulmonary:     Effort: Pulmonary effort is normal. No respiratory distress.     Breath sounds: No wheezing.  Abdominal:     General: Bowel sounds  are normal. There is no distension.     Palpations: Abdomen is soft.  Musculoskeletal:        General: No swelling.     Cervical back: No rigidity.  Skin:    General: Skin is warm.     Comments: Oily  Neurological:     Comments: Eyes fixed and dilated ~7mm; opens eyes to sternal rub; withdrawal to pain in RUE. Nonverbal, noninteractive       Consultants:    Procedures:    Data Reviewed: Results for orders placed or performed during the hospital encounter of 03/23/22 (from the past 24 hour(s))  Lactic acid, plasma     Status: None   Collection Time: 03/23/22  2:56 PM  Result Value Ref Range   Lactic Acid, Venous 1.2 0.5 - 1.9 mmol/L  Comprehensive metabolic panel     Status: Abnormal   Collection Time: 03/23/22  4:54 PM  Result Value Ref Range   Sodium 142 135 - 145 mmol/L   Potassium 3.6 3.5 - 5.1 mmol/L   Chloride 107 98 - 111 mmol/L   CO2 21 (L) 22 - 32 mmol/L   Glucose, Bld 92 70 - 99 mg/dL   BUN 11 6 - 20 mg/dL   Creatinine, Ser 0.96 0.44 - 1.00 mg/dL   Calcium 9.9 8.9 - 10.3 mg/dL   Total Protein 8.0 6.5 - 8.1 g/dL   Albumin 3.9 3.5 - 5.0 g/dL   AST 36 15 - 41 U/L   ALT 72 (H) 0 - 44 U/L   Alkaline Phosphatase 109 38 - 126 U/L   Total Bilirubin 0.9 0.3 - 1.2 mg/dL   GFR, Estimated >60 >60 mL/min   Anion gap 14 5 - 15  CBC with Differential/Platelet     Status: None   Collection Time: 03/23/22  4:54 PM  Result Value Ref Range   WBC 9.3 4.0 - 10.5 K/uL   RBC 4.50 3.87 - 5.11 MIL/uL   Hemoglobin 12.0 12.0 - 15.0 g/dL   HCT 36.3 36.0 - 46.0 %   MCV 80.7 80.0 - 100.0 fL   MCH 26.7 26.0 - 34.0 pg   MCHC 33.1 30.0 - 36.0 g/dL   RDW 15.4 11.5 - 15.5 %   Platelets 373 150 - 400 K/uL   nRBC 0.0 0.0 - 0.2 %   Neutrophils Relative % 68 %   Neutro Abs 6.4 1.7 - 7.7 K/uL   Lymphocytes Relative 22 %   Lymphs Abs 2.0 0.7 - 4.0 K/uL   Monocytes Relative 9 %   Monocytes Absolute 0.8 0.1 - 1.0 K/uL   Eosinophils Relative 1 %   Eosinophils Absolute 0.1 0.0 - 0.5 K/uL    Basophils Relative 0 %   Basophils Absolute 0.0 0.0 - 0.1 K/uL   Immature Granulocytes 0 %   Abs Immature Granulocytes 0.04 0.00 - 0.07 K/uL  Magnesium     Status: None   Collection Time: 03/23/22  4:54 PM  Result Value Ref Range   Magnesium 2.1 1.7 - 2.4 mg/dL  D-dimer, quantitative     Status: Abnormal   Collection Time: 03/23/22  4:54 PM  Result Value Ref Range   D-Dimer, Quant 1.53 (H) 0.00 - 0.50 ug/mL-FEU  Troponin I (High Sensitivity)     Status: Abnormal   Collection Time: 03/23/22  4:54 PM  Result Value Ref Range   Troponin I (High Sensitivity) 22 (H) <18 ng/L  Protime-INR     Status: None   Collection Time: 03/23/22  4:54 PM  Result Value Ref Range   Prothrombin Time 15.1 11.4 - 15.2 seconds   INR 1.2 0.8 - 1.2  T4, free     Status: Abnormal   Collection Time: 03/23/22  4:54 PM  Result Value Ref Range   Free T4 1.13 (H) 0.61 - 1.12 ng/dL  Troponin I (High Sensitivity)     Status: Abnormal   Collection Time: 03/23/22  6:18 PM  Result Value Ref Range   Troponin I (High Sensitivity) 22 (H) <18 ng/L  Urinalysis, Routine w reflex microscopic     Status: Abnormal   Collection Time: 03/23/22 10:05 PM  Result Value Ref Range   Color, Urine YELLOW YELLOW   APPearance CLOUDY (A) CLEAR   Specific Gravity, Urine 1.016 1.005 - 1.030   pH 9.0 (H) 5.0 - 8.0   Glucose, UA NEGATIVE NEGATIVE mg/dL   Hgb urine dipstick NEGATIVE NEGATIVE   Bilirubin Urine NEGATIVE NEGATIVE   Ketones, ur 20 (A) NEGATIVE mg/dL   Protein, ur 100 (A) NEGATIVE mg/dL   Nitrite POSITIVE (A) NEGATIVE   Leukocytes,Ua LARGE (A) NEGATIVE   WBC, UA >50 (H) 0 - 5 WBC/hpf   Bacteria, UA MANY (A) NONE SEEN   Squamous Epithelial / LPF 6-10 0 - 5   Triple Phosphate Crystal PRESENT   Rapid urine drug screen (hospital performed)     Status: None   Collection Time: 03/23/22 10:05 PM  Result Value Ref Range   Opiates NONE DETECTED NONE DETECTED   Cocaine NONE DETECTED NONE DETECTED   Benzodiazepines NONE  DETECTED NONE DETECTED   Amphetamines NONE DETECTED NONE DETECTED   Tetrahydrocannabinol NONE DETECTED NONE DETECTED   Barbiturates NONE DETECTED NONE DETECTED  POC CBG, ED     Status: Abnormal   Collection Time: 03/24/22  1:23 AM  Result Value Ref Range   Glucose-Capillary 114 (H) 70 - 99 mg/dL  CBC     Status: Abnormal   Collection Time: 03/24/22  1:25 AM  Result Value Ref Range   WBC 7.6 4.0 - 10.5 K/uL   RBC 4.20 3.87 - 5.11 MIL/uL   Hemoglobin 11.4 (L) 12.0 - 15.0 g/dL   HCT 34.2 (L) 36.0 - 46.0 %   MCV 81.4 80.0 - 100.0 fL   MCH 27.1 26.0 - 34.0 pg   MCHC 33.3 30.0 - 36.0 g/dL   RDW 15.6 (H) 11.5 - 15.5 %   Platelets 335 150 - 400 K/uL   nRBC 0.0 0.0 - 0.2 %  Creatinine, serum     Status: None   Collection Time: 03/24/22  1:25 AM  Result Value Ref Range   Creatinine, Ser 0.62 0.44 - 1.00 mg/dL   GFR, Estimated >60 >60 mL/min  Comprehensive metabolic panel     Status: Abnormal   Collection Time: 03/24/22  5:00 AM  Result Value Ref Range   Sodium 142 135 - 145 mmol/L   Potassium 3.9 3.5 - 5.1 mmol/L   Chloride 110 98 - 111 mmol/L   CO2 21 (L) 22 - 32 mmol/L   Glucose, Bld 102 (H) 70 - 99 mg/dL   BUN 7 6 - 20 mg/dL   Creatinine, Ser 0.64 0.44 - 1.00 mg/dL   Calcium  9.1 8.9 - 10.3 mg/dL   Total Protein 6.8 6.5 - 8.1 g/dL   Albumin 3.4 (L) 3.5 - 5.0 g/dL   AST 38 15 - 41 U/L   ALT 59 (H) 0 - 44 U/L   Alkaline Phosphatase 91 38 - 126 U/L   Total Bilirubin 0.9 0.3 - 1.2 mg/dL   GFR, Estimated >60 >60 mL/min   Anion gap 11 5 - 15  TSH     Status: None   Collection Time: 03/24/22  9:00 AM  Result Value Ref Range   TSH 1.744 0.350 - 4.500 uIU/mL    I have Reviewed nursing notes, Vitals, and Lab results since pt's last encounter. Pertinent lab results : see above I have ordered test including BMP, CBC, Mg I have reviewed the last note from staff over past 24 hours I have discussed pt's care plan and test results with nursing staff, case manager   LOS: 0 days    Dwyane Dee, MD [facility] 03/24/2022, 12:57 PM

## 2022-03-24 NOTE — Assessment & Plan Note (Addendum)
-   UA noted with large LE, positive nitrite, greater than 50 WBC - Follow-up urine culture (>100k Proteus, sensitivities reviewed) - Allergy profile reviewed, anaphylaxis noted to penicillins but she tolerated Rocephin during hospitalization - De-escalated down to [facility] to complete course at discharge

## 2022-03-24 NOTE — Assessment & Plan Note (Signed)
-   Continue amlodipine, hydralazine, propranolol

## 2022-03-24 NOTE — Progress Notes (Signed)
EEG complete - results pending 

## 2022-03-25 MED ORDER — LACTATED RINGERS IV SOLN
INTRAVENOUS | Status: AC
  Administered 2022-03-26: 1000 mL via INTRAVENOUS

## 2022-03-25 NOTE — TOC Initial Note (Addendum)
Transition of Care [facility]) - Initial/Assessment Note    Patient Details  Name: Cindy Brooks MRN: [id number] Date of Birth: 1971/05/10  Transition of Care [facility] CM/SW Contact:    Pollie Friar, RN Phone Number: 03/25/2022, 11:36 AM  Clinical Narrative:                 Patient was discharged home on 6/8 with home health services through [facility]. CM has left message for daughter to go over DME in the home and to see if they have needed medications at home.  TOC following.  1153: CM spoke to daughter over the phone. She states she has all needed DME at the home except a hospital bed but her insurance wouldn't cover. Daughter has ordered bed rails for her bed at home.  Daughter is requesting [place] syringes at d/c to flush her G tube.  Daughter requesting [facility] pharmacy for any new medications at d/c so the patient will have them when she gets home.   Expected Discharge Plan: [place] Barriers to Discharge: Continued Medical Work up   Patient Goals and CMS Choice        Expected Discharge Plan and Services Expected Discharge Plan: [place]   Discharge Planning Services: CM Consult Post Acute Care Choice: [place] arrangements for the past 2 months: Single Family Home                           HH Arranged: PT, OT          Prior Living Arrangements/Services Living arrangements for the past 2 months: Single Family Home Lives with:: Adult Children Patient language and need for interpreter reviewed:: Yes        Need for Family Participation in Patient Care: Yes (Comment) Care giver support system in place?: Yes (comment)   Criminal Activity/Legal Involvement Pertinent to Current Situation/Hospitalization: No - Comment as needed  Activities of Daily Living Home Assistive Devices/Equipment: Wheelchair ADL Screening (condition at time of admission) Patient's cognitive ability adequate to safely complete daily activities?:  No Is the patient deaf or have difficulty hearing?: No Does the patient have difficulty seeing, even when wearing glasses/contacts?: No Does the patient have difficulty concentrating, remembering, or making decisions?: Yes Patient able to express need for assistance with ADLs?: No Does the patient have difficulty dressing or bathing?: Yes Independently performs ADLs?: No Does the patient have difficulty walking or climbing stairs?: Yes Weakness of Legs: Both Weakness of Arms/Hands: Both  Permission Sought/Granted                  Emotional Assessment Appearance:: Appears stated age         Psych Involvement: No (comment)  Admission diagnosis:  Aphasia [R47.01] Tachycardia [R00.0] Acute encephalopathy [id number] Acute metabolic encephalopathy 99991111 Cerebrovascular accident (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 arteries ([place]) [I63.513] Patient Active Problem List   Diagnosis Date Noted   Acute encephalopathy 03/24/2022   Tachycardia    Aphasia 03/23/2022   Dysphagia with PEG tube 03/20/2022   Acute urinary retention 03/20/2022   Normocytic anemia 03/20/2022   CVA (cerebral vascular accident) ([place]) 03/20/2022   Coma ([place]) 02/07/2022   Antiphospholipid antibody with hypercoagulable state ([place]) 02/04/2022   Pressure injury of skin 02/01/2022   PEG  status ([place]) 01/29/2022   UTI (urinary tract infection) 01/28/2022   HTN (hypertension)    Acute bilateral thalamic, midbrain and bilateral  cerebral ischemic CVA 0000000   Acute metabolic encephalopathy [id number]   PCP:  Pcp, No Pharmacy:   [place], [place] [address] Phone: [phone] Fax: [phone]     Social Determinants of Health (SDOH) Interventions    Readmission Risk Interventions    03/13/2022    9:03 AM  Readmission Risk Prevention Plan  Transportation Screening Complete  PCP or Specialist Appt within  5-7 Days Complete  Home Care Screening Complete  Medication Review (RN CM) Complete

## 2022-03-25 NOTE — Evaluation (Signed)
Clinical/Bedside Swallow Evaluation Patient Details  Name: Cindy Brooks MRN: [id number] Date of Birth: 1971-07-11  Today's Date: 03/25/2022 Time: SLP Start Time (ACUTE ONLY): [id number] SLP Stop Time (ACUTE ONLY): 1315 SLP Time Calculation (min) (ACUTE ONLY): 19 min  Past Medical History:  Past Medical History:  Diagnosis Date   Hypertension    Hypertensive urgency 01/20/2022   Seizure ([place]) 01/28/2022   TIA (transient ischemic attack) 2019   Urinary tract infection 01/28/2022   Past Surgical History:  Past Surgical History:  Procedure Laterality Date   ESOPHAGOGASTRODUODENOSCOPY N/A 01/28/2022   Procedure: ESOPHAGOGASTRODUODENOSCOPY (EGD);  Surgeon: Jesusita Oka, MD;  Location: [facility] ENDOSCOPY;  Service: General;  Laterality: N/A;   PEG PLACEMENT N/A 01/28/2022   Procedure: PERCUTANEOUS ENDOSCOPIC GASTROSTOMY (PEG) PLACEMENT;  Surgeon: Jesusita Oka, MD;  Location: [facility] ENDOSCOPY;  Service: General;  Laterality: N/A;   HPI:  Pt is a 51 y.o. female history of recent admission for CVA and metabolic encephalopathy, hypertension who had trach and PEG placed eventually extubated was discharged home 6/8 was found to be nonverbal as noticed by patient's daughters.  Per report pt also had a fall.  Pt at the time of discharge was communicating with short sentences which family reports she has stopped doing. She was brought to ED. MRI and CT neg for acute changes. MBS (02/28/22) was limited in nature due to lethargy, although no penetration/aspiration noted with thin bolus. Diet initiation deferred till mentation improved. Last SLP note (6/8) recommended dys 2, thin liquid diet and meds crushed in puree.  PMH: HTN, seizure, TIA, UTI.    Assessment / Plan / Recommendation  Clinical Impression  Pt presents with primary oral dysphagia, at bedside, likely impacted by mentation/lethargy. Pt intermittently followed commands for limited oral mechanism examination, which was unremarkable. She presents with  adequate dentition. Oral care provided prior to trials of ice chips, sips of thin liquids by straw and bites of puree. x1 overt cough exhibited with thin liquids across x6 trials. Oral manipulation and A-P transit was prolonged, but pt was able to clear oral cavity of solids with time/repeat swallow. Regular textured solid not attempted due to increasing lethargy and reduced oral manipulation with repeated puree trials. Recommend continue NPO with ice chips after oral care when pt is alert. Once mentation and level of alertness improves, pt will likely be able to advance to oral diet. Will f/u.  SLP Visit Diagnosis: Dysphagia, unspecified (R13.10)    Aspiration Risk  Mild aspiration risk    Diet Recommendation NPO;Ice chips PRN after oral care   Medication Administration: Via alternative means Supervision: Full supervision/cueing for compensatory strategies Compensations: Slow rate;Small sips/bites Postural Changes: Seated upright at 90 degrees    Other  Recommendations Oral Care Recommendations: Oral care QID;Oral care prior to ice chip/H20    Recommendations for follow up therapy are one component of a multi-disciplinary discharge planning process, led by the attending physician.  Recommendations may be updated based on patient status, additional functional criteria and insurance authorization.  Follow up Recommendations Other (comment) (TBD)      Assistance Recommended at Discharge Frequent or constant Supervision/Assistance  Functional Status Assessment Patient has had a recent decline in their functional status and demonstrates the ability to make significant improvements in function in a reasonable and predictable amount of time.  Frequency and Duration min 2x/week  2 weeks       Prognosis Prognosis for Safe Diet Advancement: Good Barriers to Reach Goals: Cognitive deficits;Language deficits;Time post onset  Swallow Study   General Date of Onset: 03/23/22 HPI: Pt is a 51  y.o. female history of recent admission for CVA and metabolic encephalopathy, hypertension who had trach and PEG placed eventually extubated was discharged home 6/8 was found to be nonverbal as noticed by patient's daughters.  Per report pt also had a fall.  Pt at the time of discharge was communicating with short sentences which family reports she has stopped doing. She was brought to ED. MRI and CT neg for acute changes. MBS (02/28/22) was limited in nature due to lethargy, although no penetration/aspiration noted with thin bolus. Diet initiation deferred till mentation improved. Last SLP note (6/8) recommended dys 2, thin liquid diet and meds crushed in puree.  PMH: HTN, seizure, TIA, UTI. Type of Study: Bedside Swallow Evaluation Previous Swallow Assessment: see HPI Diet Prior to this Study: NPO Temperature Spikes Noted: Yes (99.6) Respiratory Status: Room air History of Recent Intubation: No Behavior/Cognition: Lethargic/Drowsy;Confused Oral Cavity Assessment: Within Functional Limits Oral Care Completed by SLP: Yes Oral Cavity - Dentition: Adequate natural dentition Vision: Functional for self-feeding Self-Feeding Abilities: Needs assist Patient Positioning: Upright in bed;Postural control adequate for testing Baseline Vocal Quality: Normal Volitional Cough: Weak Volitional Swallow: Able to elicit    Oral/Motor/Sensory Function Overall Oral Motor/Sensory Function: Within functional limits (limited assessment)   Ice Chips Ice chips: Within functional limits Presentation: Spoon   Thin Liquid Thin Liquid: Impaired Presentation: Straw Pharyngeal  Phase Impairments: Cough - Immediate    Nectar Thick Nectar Thick Liquid: Not tested   Honey Thick Honey Thick Liquid: Not tested   Puree Puree: Impaired Presentation: Spoon Oral Phase Impairments: Impaired mastication;Reduced lingual movement/coordination Oral Phase Functional Implications: Oral holding;Prolonged oral transit   Solid      Solid: Not tested       Ellwood Dense, [place], [place] Office Number: [phone]  Acie Fredrickson 03/25/2022,1:36 PM

## 2022-03-25 NOTE — Progress Notes (Signed)
Progress Note    Cindy Brooks   [id number]  DOB: [birth year]  DOA: 03/23/2022     1 PCP: Pcp, No  Initial CC: talking less  Hospital Course: Cindy Brooks is a 51 yo female with PMH recent CVA, HTN who presented after a fall at home.  She was recently discharged on 03/21/2022 after a prolonged hospitalization with original admit date on 01/20/2022.  She was treated for acute bilateral thalamic, midbrain, and bilateral cerebral infarctions.  She required tracheostomy and PEG tube placement.  She had progressive clinical improvement and was able to be decannulated on 03/01/2022.  Diet was also slowly advanced and tube feeds were discontinued.  She tolerated dysphagia level 2 diet prior to discharge. Patient able to communicate with short sentences at baseline prior to discharge and was unable to do so at home per family, therefore was brought back to the hospital. After evaluation by neurology in the ER, concern was for possible recrudescence of her stroke in setting of possible UTI. Urinalysis was noted with LE, nitrite positive, greater than 50 WBC.  She was started on Rocephin.  Interval History:  No events overnight.  This morning she is actually more awake and alert compared to the ER yesterday.  She was able to tell me her name and answer some basic questions per her prior baseline.  She is also able to follow all commands easily.  Assessment and Plan: * Acute metabolic encephalopathy - likely multifactorial as she already had poor mentation at discharge with low level functioning baseline in background of rather significant B/L CVA in April 2023, and now with probable superimposed UTI causing some recrudescence and/or worsening mentation with poor reserve - treating UTI and monitor mentation response - discussed with neurology, will also obtain EEG  - follow up urine and blood cultures -Clinically, her mentation is improved some today  UTI (urinary tract infection) - UA noted with  large LE, positive nitrite, greater than 50 WBC - Follow-up urine culture (>100k Proteus, follow up sens) - Continue Rocephin, good susceptibility coverage typically   CVA (cerebral vascular accident) (HCC) - Diagnosed with initial CVA on 01/22/2022 showing bilateral thalamic and midbrain infarctions. She was intubated and required trach/PEG and was advanced to dysphagia 2 diet and decannulated from trach -Continue Plavix and Lipitor -Discharge summary notes that she was oriented to self, follows commands, and capable of feeding herself with supervision  Acute urinary retention - Failed voiding trials during hospitalization and was discharged with indwelling Foley  Dysphagia with PEG tube - Patient was able to be weaned off tube feeds and transitioned to dysphagia level 2 diet -Did not pass swallow eval today but she is more awake and alert today.  Continue n.p.o. and follow-up repeat SLP eval tomorrow.  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 rate (!) 23, height 5'  4" (1.626 m), weight 79.2 kg, SpO2 90 %.  Examination:  Physical Exam Constitutional:      Comments: More interactive today and able to answer questions and follow commands  HENT:     Head: Normocephalic and atraumatic.      Mouth/Throat:     Mouth: Mucous membranes are dry.  Eyes:     Comments: Fixed and dilated approximately 4 mm bilaterally  Cardiovascular:     Rate and Rhythm: Normal rate and regular rhythm.  Pulmonary:     Effort: Pulmonary effort is normal. No respiratory distress.     Breath sounds: No wheezing.  Abdominal:     General: Bowel sounds are normal. There is no distension.     Palpations: Abdomen is soft.  Musculoskeletal:        General: No swelling.     Cervical back: No rigidity.  Skin:    General: Skin is warm.     Comments: Oily  Neurological:     Comments: Eyes fixed and dilated ~23mm; follows commands      Consultants:    Procedures:    Data Reviewed: No results found for this or any previous visit (from the past 24 hour(s)).   I have Reviewed nursing notes, Vitals, and Lab results since pt's last encounter. Pertinent lab results : see above I have ordered test including BMP, CBC, Mg I have reviewed the last note from staff over past 24 hours I have discussed pt's care plan and test results with nursing staff, case manager   LOS: 1 day   Lewie Chamber, MD [facility] 03/25/2022, 3:24 PM

## 2022-03-26 ENCOUNTER — Other Ambulatory Visit (HOSPITAL_COMMUNITY): Payer: Self-pay

## 2022-03-26 DIAGNOSIS — R1310 Dysphagia, unspecified: Secondary | ICD-10-CM

## 2022-03-26 LAB — BASIC METABOLIC PANEL
Anion gap: 10 (ref 5–15)
BUN: 8 mg/dL (ref 6–20)
CO2: 23 mmol/L (ref 22–32)
Calcium: 9.4 mg/dL (ref 8.9–10.3)
Chloride: 110 mmol/L (ref 98–111)
Creatinine, Ser: 0.66 mg/dL (ref 0.44–1.00)
GFR, Estimated: >60 mL/min (ref >60)
Glucose, Bld: 80 mg/dL (ref 70–99)
Potassium: 3.2 mmol/L — ABNORMAL LOW (ref 3.5–5.1)
Sodium: 143 mmol/L (ref 135–145)

## 2022-03-26 LAB — CBC WITH DIFFERENTIAL/PLATELET
Abs Immature Granulocytes: 0.02 10*3/uL (ref 0.00–0.07)
Basophils Absolute: 0 10*3/uL (ref 0.0–0.1)
Basophils Relative: 1 %
Eosinophils Absolute: 0.3 10*3/uL (ref 0.0–0.5)
Eosinophils Relative: 5 %
HCT: 32.7 % — ABNORMAL LOW (ref 36.0–46.0)
Hemoglobin: 10.8 g/dL — ABNORMAL LOW (ref 12.0–15.0)
Immature Granulocytes: 0 %
Lymphocytes Relative: 35 %
Lymphs Abs: 2.3 10*3/uL (ref 0.7–4.0)
MCH: 26.8 pg (ref 26.0–34.0)
MCHC: 33 g/dL (ref 30.0–36.0)
MCV: 81.1 fL (ref 80.0–100.0)
Monocytes Absolute: 0.5 10*3/uL (ref 0.1–1.0)
Monocytes Relative: 8 %
Neutro Abs: 3.4 10*3/uL (ref 1.7–7.7)
Neutrophils Relative %: 51 %
Platelets: 305 10*3/uL (ref 150–400)
RBC: 4.03 MIL/uL (ref 3.87–5.11)
RDW: 15.4 % (ref 11.5–15.5)
WBC: 6.6 10*3/uL (ref 4.0–10.5)
nRBC: 0 % (ref 0.0–0.2)

## 2022-03-26 LAB — MAGNESIUM: Magnesium: 1.9 mg/dL (ref 1.7–2.4)

## 2022-03-26 MED ORDER — CEFDINIR 300 MG PO CAPS
600.0000 mg | ORAL_CAPSULE | Freq: Every day | ORAL | Status: DC
  Administered 2022-03-26: 600 mg via ORAL
  Filled 2022-03-26: qty 2

## 2022-03-26 MED ORDER — CEFDINIR 300 MG PO CAPS
600.0000 mg | ORAL_CAPSULE | Freq: Every day | ORAL | 0 refills | Status: AC
  Filled 2022-03-26: qty 6, 3d supply, fill #0

## 2022-03-26 NOTE — Progress Notes (Signed)
Speech Language Pathology Treatment: Dysphagia  Patient Details Name: Cindy Brooks MRN: [id number] DOB: 1971/10/11 Today's Date: 03/26/2022 Time: [id number] SLP Time Calculation (min) (ACUTE ONLY): 19 min  Assessment / Plan / Recommendation Clinical Impression  Pt with improved alertness this am and with increased participation/attention during PO intake. She self fed large and consecutive straw sips of thin liquids across multiple trials, without overt s/sx of aspiration. Mastication and bolus formation continue to be prolonged, but pt was able to orally clear puree, simulated dys 2 and regular textures. Effortful biting of regular cracker noted in 1/2 trials and question impact of mentation vs fatigue with PO intake. Recommend initiation of dys 2/thin liquid diet with staff to provide 1:1 supervision for all meals. SLP to continue following during acute stay.    HPI HPI: Pt is a 51 y.o. female history of recent admission for CVA and metabolic encephalopathy, hypertension who had trach and PEG placed eventually extubated was discharged home 6/8 was found to be nonverbal as noticed by patient's daughters.  Per report pt also had a fall.  Pt at the time of discharge was communicating with short sentences which family reports she has stopped doing. She was brought to ED. MRI and CT neg for acute changes. MBS (02/28/22) was limited in nature due to lethargy, although no penetration/aspiration noted with thin bolus. Diet initiation deferred till mentation improved. Last SLP note (6/8) recommended dys 2, thin liquid diet and meds crushed in puree.  PMH: HTN, seizure, TIA, UTI.      SLP Plan  Continue with current plan of care      Recommendations for follow up therapy are one component of a multi-disciplinary discharge planning process, led by the attending physician.  Recommendations may be updated based on patient status, additional functional criteria and insurance authorization.     Recommendations  Diet recommendations: Dysphagia 2 (fine chop);Thin liquid Liquids provided via: Cup;Straw Medication Administration: Crushed with puree Supervision: Staff to assist with self feeding;Full supervision/cueing for compensatory strategies Compensations: Minimize environmental distractions;Slow rate;Small sips/bites;Follow solids with liquid Postural Changes and/or Swallow Maneuvers: Seated upright 90 degrees                Oral Care Recommendations: Oral care BID;Staff/trained caregiver to provide oral care Follow Up Recommendations: Home health SLP Assistance recommended at discharge: Frequent or constant Supervision/Assistance SLP Visit Diagnosis: Dysphagia, unspecified (R13.10) Plan: Continue with current plan of care            Ellwood Dense, [place], [place] Office Number: [place]  03/26/2022, 10:22 AM

## 2022-03-26 NOTE — Progress Notes (Signed)
Patient d/c home transported by [facility] services, attempted to call both daughters listed in chart to review AVS  no answer. AVS printed and included in d/c packet. Discharge medications  provided by [facility] and sent with PTAR.  Anubis Fundora, Kae Heller, RN

## 2022-03-26 NOTE — Discharge Summary (Signed)
Physician Discharge Summary   Cindy Brooks [id number] DOB: 02/25/1971 DOA: 03/23/2022  PCP: Cindy Brooks, No  Admit date: 03/23/2022 Discharge date:  03/26/2022  Admitted From: Home Disposition:  Home Discharging physician: Cindy Dee, MD  Recommendations for Outpatient Follow-up:  Outpatient follow-up with neurology as previously planned   Discharge Condition: stable CODE STATUS: Full Diet recommendation:  Diet Orders (From admission, onward)     Start     Ordered   03/26/22 1028  DIET DYS 2 Room service appropriate? No; Fluid consistency: Thin  Diet effective now       Question Answer Comment  Room service appropriate? No   Fluid consistency: Thin      03/26/22 1027            Hospital Course: Cindy Brooks is a 51 yo female with PMH recent CVA, HTN who presented after a fall at home.  She was recently discharged on 03/21/2022 after a prolonged hospitalization with original admit date on 01/20/2022.  She was treated for acute bilateral thalamic, midbrain, and bilateral cerebral infarctions.  She required tracheostomy and PEG tube placement.  She had progressive clinical improvement and was able to be decannulated on 03/01/2022.  Diet was also slowly advanced and tube feeds were discontinued.  She tolerated dysphagia level 2 diet prior to discharge. Patient able to communicate with short sentences at baseline prior to discharge and was unable to do so at home per family, therefore was brought back to the hospital. After evaluation by neurology in the ER, concern was for possible recrudescence of her stroke in setting of possible UTI. Urinalysis was noted with LE, nitrite positive, greater than 50 WBC.  She was started on Rocephin.  She tolerated Rocephin during hospitalization as well.  She was transitioned to [facility] to complete course at discharge.  Urine culture grew Proteus, sensitivities reviewed prior to de-escalation to oral antibiotics.  Mentation also improved back to baseline  prior to discharge and she was also able to be advanced back to dysphagia level 2 diet by SLP prior to discharge which was her previous level diet.  Assessment and Plan: * Acute metabolic encephalopathy-resolved as of 03/26/2022 - likely multifactorial as she already had poor mentation at discharge with low level functioning baseline in background of rather significant B/L CVA in April 2023, and now with probable superimposed UTI causing some recrudescence and/or worsening mentation with poor reserve - treating UTI and monitor mentation response; has improved back to baseline -No epileptiform abnormalities noted on EEG  UTI (urinary tract infection) - UA noted with large LE, positive nitrite, greater than 50 WBC - Follow-up urine culture (>100k Proteus, sensitivities reviewed) - Allergy profile reviewed, anaphylaxis noted to penicillins but she tolerated Rocephin during hospitalization - De-escalated down to [facility] to complete course at discharge  CVA (cerebral vascular accident) ([place]) - Diagnosed with initial CVA on 01/22/2022 showing bilateral thalamic and midbrain infarctions. She was intubated and required trach/PEG and was advanced to dysphagia 2 diet and decannulated from trach -Continue Plavix and Lipitor -Discharge summary notes that she was oriented to self, follows commands, and capable of feeding herself with supervision  Acute urinary retention - Failed voiding trials during hospitalization and was discharged with indwelling Foley  Dysphagia with PEG tube - Patient was able to be weaned off tube feeds and transitioned to dysphagia level 2 diet -Did not pass swallow eval on 6/12 but passed well on 03/26/2022.  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, lupus anticoagulant - Continued on Plavix - Outpatient referral  to hematology  HTN (hypertension) - Continue amlodipine, hydralazine, propranolol       Principal Diagnosis: Acute metabolic encephalopathy  Discharge Diagnoses: Active Problems:   UTI (urinary tract infection)   CVA (cerebral vascular accident) (HCC)   Dysphagia with PEG tube   Acute urinary retention   Antiphospholipid antibody with hypercoagulable state (HCC)   HTN (hypertension)   Aphasia   Discharge Instructions     Ambulatory referral to Neurology   Complete by: As directed    Follow up with Cindy Brooks at [facility] in 4-6 weeks. Too complicated for RN to follow. Thanks.   Increase activity slowly   Complete by: As directed       Allergies as of 03/26/2022       Reactions   Aspirin Hives, Rash   Codeine Hives, Rash   Penicillins Anaphylaxis, Swelling, Other (See Comments)   Strawberry Extract Anaphylaxis, Hives        Medication List     TAKE these medications    acetaminophen 325 MG tablet Commonly known as: Tylenol Take 2 tablets (650 mg total) by mouth every 4 (four) hours as needed.   amantadine 100 MG capsule Commonly known as: SYMMETREL Take 1 capsule (100 mg total) by mouth 2 (two) times daily.   amLODipine 10 MG tablet Commonly known as: NORVASC Take 1 tablet (10 mg total) by mouth daily.   atorvastatin 80 MG tablet Commonly known as: LIPITOR Take 1 tablet (80 mg total) by mouth daily.   cefdinir 300 MG capsule Commonly known as: OMNICEF Take 2 capsules (600 mg total) by mouth daily for 3 days. Start taking on: March 27, 2022   clopidogrel 75 MG tablet Commonly known as: PLAVIX Take 1 tablet (75 mg total) by mouth daily.   feeding supplement Liqd Take 237 mLs by mouth 3 (three) times daily between meals.   folic acid 1 MG tablet Commonly known as: FOLVITE Take 1 tablet (1 mg total) by mouth daily.   gabapentin 100 MG capsule Commonly known as: NEURONTIN Take 1 capsule (100 mg total) by mouth 2 (two) times daily.   Gerhardt's  butt cream Crea Apply 1 application. topically daily as needed for irritation.   glycopyrrolate 1 MG tablet Commonly known as: ROBINUL Take 0.5 tablets (0.5 mg total) by mouth 2 (two) times daily.   hydrALAZINE 50 MG tablet Commonly known as: APRESOLINE Take 1 tablet (50 mg total) by mouth every 8 (eight) hours.   ipratropium-albuterol 0.5-2.5 (3) MG/3ML Soln Commonly known as: DUONEB Take 3 mLs by nebulization every 6 (six) hours as needed. What changed: reasons to take this   multivitamin with minerals Tabs tablet Take 1 tablet by mouth daily.   polyethylene glycol powder 17 GM/SCOOP powder Commonly known as: MiraLax Take 17 g by mouth 2 (two) times daily as needed for moderate constipation.   propranolol 40 MG tablet Commonly known as: INDERAL Take 1 tablet (40 mg total) by mouth 3 (three) times daily.   senna-docusate 8.6-50 MG tablet Commonly known as: Senokot-S Take 1 tablet by mouth 2 (two) times daily between meals as needed for mild constipation.   thiamine 100 MG tablet Take 1 tablet (100 mg total) by mouth daily.        Follow-up Information     [facility]. Call.  Why: She has an appointment already for July 5 Contact information: [address] [place] [phone]        Medications Follow up.   Contact information: Make sure medications are filled nad given as directed        Garvin Fila, MD. Schedule an appointment as soon as possible for a visit in 1 month(s).   Specialties: Neurology, Radiology Why: stroke clinic Contact information: [address] [place] [address] [phone]                Allergies  Allergen Reactions   Aspirin Hives and Rash   Codeine Hives and Rash   Penicillins Anaphylaxis, Swelling and Other (See Comments)   Strawberry Extract Anaphylaxis and Hives    Consultations: Neurology    Procedures:   Discharge Exam: BP (!) 148/96 (BP Location: Right Arm)   Pulse 85   Temp 98.6 F (37 C) (Oral)   Resp 18   Ht 5\' 4"  (1.626 m)   Wt 79.2 kg   LMP  (LMP Unknown)   SpO2 100%   BMI 29.97 kg/m  Physical Exam Constitutional:      Comments: More interactive today and able to answer questions and follow commands  HENT:     Head: Normocephalic and atraumatic.     Mouth/Throat:     Mouth: Mucous membranes are dry.  Eyes:     Comments: Fixed and dilated approximately 4 mm bilaterally  Cardiovascular:     Rate and Rhythm: Normal rate and regular rhythm.  Pulmonary:     Effort: Pulmonary effort is normal. No respiratory distress.     Breath sounds: No wheezing.  Abdominal:     General: Bowel sounds are normal. There is no distension.     Palpations: Abdomen is soft.  Musculoskeletal:        General: No swelling.     Cervical back: No rigidity.  Skin:    General: Skin is warm.     Comments: Oily  Neurological:     Comments: Eyes fixed and dilated ~49mm; follows commands      The results of significant diagnostics from this hospitalization (including imaging, microbiology, ancillary and laboratory) are listed below for reference.   Microbiology: Recent Results (from the past 240 hour(s))  Blood Culture (routine x 2)     Status: None (Preliminary result)   Collection Time: 03/23/22  2:56 PM   Specimen: BLOOD RIGHT FOREARM  Result Value Ref Range Status   Specimen Description BLOOD RIGHT FOREARM  Final   Special Requests   Final    BOTTLES DRAWN AEROBIC AND ANAEROBIC Blood Culture results may not be optimal due to an excessive volume of blood received in culture bottles   Culture   Final    NO GROWTH 3 DAYS Performed at [facility], [place] [address], [place], [place] [address]    Report Status PENDING  Incomplete  Blood Culture (routine x 2)     Status: None (Preliminary result)   Collection Time: 03/23/22  6:18 PM   Specimen: BLOOD  Result Value Ref  Range Status   Specimen Description BLOOD SITE NOT SPECIFIED  Final   Special Requests   Final    BOTTLES DRAWN AEROBIC AND ANAEROBIC Blood Culture adequate volume   Culture   Final    NO GROWTH 3 DAYS Performed at [facility], [address], [place], [place] [address]  Report Status PENDING  Incomplete  Urine Culture     Status: Abnormal (Preliminary result)   Collection Time: 03/23/22 10:06 PM   Specimen: In/Out Cath Urine  Result Value Ref Range Status   Specimen Description IN/OUT CATH URINE  Final   Special Requests NONE  Final   Culture >=100,000 COLONIES/mL PROTEUS MIRABILIS (A)  Final   Report Status PENDING  Incomplete   Organism ID, Bacteria PROTEUS MIRABILIS (A)  Final      Susceptibility   Proteus mirabilis - MIC*    AMPICILLIN <=2 SENSITIVE Sensitive     CEFAZOLIN <=4 SENSITIVE Sensitive     CEFEPIME <=0.12 SENSITIVE Sensitive     CEFTRIAXONE <=0.25 SENSITIVE Sensitive     CIPROFLOXACIN <=0.25 SENSITIVE Sensitive     GENTAMICIN <=1 SENSITIVE Sensitive     IMIPENEM 4 SENSITIVE Sensitive     NITROFURANTOIN 128 RESISTANT Resistant     TRIMETH/SULFA <=20 SENSITIVE Sensitive     AMPICILLIN/SULBACTAM <=2 SENSITIVE Sensitive     PIP/TAZO Value in next row Sensitive      <=4 SENSITIVEPerformed at [facility], [address] [place], [place] [address]    * >=100,000 COLONIES/mL PROTEUS MIRABILIS     Labs: BNP (last 3 results) No results for input(s): "BNP" in the last 8760 hours. Basic Metabolic Panel: Recent Labs  Lab 03/23/22 1654 03/24/22 0125 03/24/22 0500 03/26/22 0216  NA 142  --  142 143  K 3.6  --  3.9 3.2*  CL 107  --  110 110  CO2 21*  --  21* 23  GLUCOSE 92  --  102* 80  BUN 11  --  7 8  CREATININE 0.96 0.62 0.64 0.66  CALCIUM 9.9  --  9.1 9.4  MG 2.1  --   --  1.9   Liver Function Tests: Recent Labs  Lab 03/23/22 1654 03/24/22 0500  AST 36 38  ALT 72* 59*  ALKPHOS 109 91  BILITOT 0.9 0.9  PROT 8.0 6.8  ALBUMIN 3.9 3.4*    No results for input(s): "LIPASE", "AMYLASE" in the last 168 hours. No results for input(s): "AMMONIA" in the last 168 hours. CBC: Recent Labs  Lab 03/23/22 1654 03/24/22 0125 03/26/22 0216  WBC 9.3 7.6 6.6  NEUTROABS 6.4  --  3.4  HGB 12.0 11.4* 10.8*  HCT 36.3 34.2* 32.7*  MCV 80.7 81.4 81.1  PLT 373 335 305   Cardiac Enzymes: No results for input(s): "CKTOTAL", "CKMB", "CKMBINDEX", "TROPONINI" in the last 168 hours. BNP: Invalid input(s): "POCBNP" CBG: Recent Labs  Lab 03/20/22 1934 03/20/22 2329 03/21/22 0759 03/21/22 1130 03/24/22 0123  GLUCAP 110* 124* 100* 121* 114*   D-Dimer Recent Labs    03/23/22 1654  DDIMER 1.53*   Hgb A1c No results for input(s): "HGBA1C" in the last 72 hours. Lipid Profile No results for input(s): "CHOL", "HDL", "LDLCALC", "TRIG", "CHOLHDL", "LDLDIRECT" in the last 72 hours. Thyroid function studies Recent Labs    03/24/22 0900  TSH 1.744   Anemia work up No results for input(s): "VITAMINB12", "FOLATE", "FERRITIN", "TIBC", "IRON", "RETICCTPCT" in the last 72 hours. Urinalysis    Component Value Date/Time   COLORURINE YELLOW 03/23/2022 2205   APPEARANCEUR CLOUDY (A) 03/23/2022 2205   LABSPEC 1.016 03/23/2022 2205   PHURINE 9.0 (H) 03/23/2022 2205   GLUCOSEU NEGATIVE 03/23/2022 2205   HGBUR NEGATIVE 03/23/2022 2205   BILIRUBINUR NEGATIVE 03/23/2022 2205   KETONESUR 20 (A) 03/23/2022 2205   PROTEINUR 100 (A) 03/23/2022  2205   NITRITE POSITIVE (A) 03/23/2022 2205   LEUKOCYTESUR LARGE (A) 03/23/2022 2205   Sepsis Labs Recent Labs  Lab 03/23/22 1654 04-10-22 0125 03/26/22 0216  WBC 9.3 7.6 6.6   Microbiology Recent Results (from the past 240 hour(s))  Blood Culture (routine x 2)     Status: None (Preliminary result)   Collection Time: 03/23/22  2:56 PM   Specimen: BLOOD RIGHT FOREARM  Result Value Ref Range Status   Specimen Description BLOOD RIGHT FOREARM  Final   Special Requests   Final    BOTTLES DRAWN  AEROBIC AND ANAEROBIC Blood Culture results may not be optimal due to an excessive volume of blood received in culture bottles   Culture   Final    NO GROWTH 3 DAYS Performed at [facility], [place] [address], [place], [place] [address]    Report Status PENDING  Incomplete  Blood Culture (routine x 2)     Status: None (Preliminary result)   Collection Time: 03/23/22  6:18 PM   Specimen: BLOOD  Result Value Ref Range Status   Specimen Description BLOOD SITE NOT SPECIFIED  Final   Special Requests   Final    BOTTLES DRAWN AEROBIC AND ANAEROBIC Blood Culture adequate volume   Culture   Final    NO GROWTH 3 DAYS Performed at [facility], [address] [place], [place] [address]    Report Status PENDING  Incomplete  Urine Culture     Status: Abnormal (Preliminary result)   Collection Time: 03/23/22 10:06 PM   Specimen: In/Out Cath Urine  Result Value Ref Range Status   Specimen Description IN/OUT CATH URINE  Final   Special Requests NONE  Final   Culture >=100,000 COLONIES/mL PROTEUS MIRABILIS (A)  Final   Report Status PENDING  Incomplete   Organism ID, Bacteria PROTEUS MIRABILIS (A)  Final      Susceptibility   Proteus mirabilis - MIC*    AMPICILLIN <=2 SENSITIVE Sensitive     CEFAZOLIN <=4 SENSITIVE Sensitive     CEFEPIME <=0.12 SENSITIVE Sensitive     CEFTRIAXONE <=0.25 SENSITIVE Sensitive     CIPROFLOXACIN <=0.25 SENSITIVE Sensitive     GENTAMICIN <=1 SENSITIVE Sensitive     IMIPENEM 4 SENSITIVE Sensitive     NITROFURANTOIN 128 RESISTANT Resistant     TRIMETH/SULFA <=20 SENSITIVE Sensitive     AMPICILLIN/SULBACTAM <=2 SENSITIVE Sensitive     PIP/TAZO Value in next row Sensitive      <=4 SENSITIVEPerformed at [facility] [address] [place], [place] [address]    * >=100,000 COLONIES/mL PROTEUS MIRABILIS    Procedures/Studies: EEG adult  Result Date: 2022/04/10 Derek Jack, MD     2022/04/10  8:03 PM Routine EEG Report Deneane Tetter is a 51  y.o. female with a history of speech impairment and encephalopathy who is undergoing an EEG to evaluate for seizures. Report: This EEG was acquired with electrodes placed according to the International 10-20 electrode system (including Fp1, Fp2, F3, F4, C3, C4, P3, P4, O1, O2, T3, T4, T5, T6, A1, A2, Fz, Cz, Pz). The following electrodes were missing or displaced: none. The occipital dominant rhythm was 7 Hz. This activity is reactive to stimulation. Drowsiness was manifested by background fragmentation; deeper stages of sleep were identified by K complexes and sleep spindles. There was no focal slowing. There were no interictal epileptiform discharges. There were no electrographic seizures identified. Photic stimulation and hyperventilation were not performed. Impression  and clinical correlation: This EEG was obtained while awake and asleep and is abnormal due to mild diffuse slowing indicative of global cerebral dysfunction. Epileptiform abnormalities were not seen during this recording. Su Monks, MD [facility] [phone] If 7pm- 7am, please page neurology on call as listed in [place].   DG Pelvis 1-2 Views  Result Date: 03/24/2022 CLINICAL DATA:  Fall, pelvic pain EXAM: PELVIS - 1-2 VIEW COMPARISON:  None Available. FINDINGS: There is no evidence of pelvic fracture or diastasis. No pelvic bone lesions are seen. IMPRESSION: Negative. Electronically Signed   By: Fidela Salisbury M.D.   On: 03/24/2022 01:57   MR BRAIN WO CONTRAST  Result Date: 03/23/2022 CLINICAL DATA:  Mental status change EXAM: MRI HEAD WITHOUT CONTRAST TECHNIQUE: Multiplanar, multiecho pulse sequences of the brain and surrounding structures were obtained without intravenous contrast. COMPARISON:  Brain MRI 01/30/2022 FINDINGS: Brain: Resolving diffusion abnormality in the right corona radiata. No acute abnormality. Multiple chronic microhemorrhages in a predominantly central distribution. There are old infarcts of the left  basal and both thalami. The midline structures are normal. Vascular: Major flow voids are preserved. Skull and upper cervical spine: Normal calvarium and skull base. Visualized upper cervical spine and soft tissues are normal. Sinuses/Orbits:Partial opacification of the left maxillary and sphenoid sinuses. Normal orbits. IMPRESSION: 1. No acute intracranial abnormality. 2. Resolving diffusion abnormality in the right corona radiata from recent infarct. 3. Multiple chronic microhemorrhages in a predominantly central distribution, consistent with chronic hypertensive angiopathy. Electronically Signed   By: Ulyses Jarred M.D.   On: 03/23/2022 23:44   CT Angio Chest PE W and/or Wo Contrast  Result Date: 03/23/2022 CLINICAL DATA:  Recent fall. EXAM: CT ANGIOGRAPHY CHEST WITH CONTRAST TECHNIQUE: Multidetector CT imaging of the chest was performed using the standard protocol during bolus administration of intravenous contrast. Multiplanar CT image reconstructions and MIPs were obtained to evaluate the vascular anatomy. RADIATION DOSE REDUCTION: This exam was performed according to the departmental dose-optimization program which includes automated exposure control, adjustment of the mA and/or kV according to patient size and/or use of iterative reconstruction technique. CONTRAST:  81mL OMNIPAQUE IOHEXOL 350 MG/ML SOLN COMPARISON:  February 04, 2022 FINDINGS: Cardiovascular: Satisfactory opacification of the pulmonary arteries to the segmental level. No evidence of pulmonary embolism. There is mild to moderate severity cardiomegaly. A small to moderate sized pericardial effusion is seen (maximum thickness of 1.3 cm in AP measurement). This is increased in size when compared to the prior exam. Mediastinum/Nodes: No enlarged mediastinal, hilar, or axillary lymph nodes. Thyroid gland, trachea, and esophagus demonstrate no significant findings. Lungs/Pleura: Mild, hazy atelectatic changes are seen within the bilateral upper  lobes and bilateral lower lobes. There is no evidence of a pleural effusion or pneumothorax. Upper Abdomen: No acute abnormality. Musculoskeletal: No chest wall abnormality. No acute or significant osseous findings. Review of the MIP images confirms the above findings. IMPRESSION: 1. No evidence of pulmonary embolism. 2. Mild to moderate severity cardiomegaly with a small to moderate sized pericardial effusion. 3. Mild, hazy bilateral upper lobe and bilateral lower lobe atelectasis. Electronically Signed   By: Virgina Norfolk M.D.   On: 03/23/2022 19:38   CT HEAD WO CONTRAST  Result Date: 03/23/2022 CLINICAL DATA:  Neurological deficit EXAM: CT HEAD WITHOUT CONTRAST TECHNIQUE: Contiguous axial images were obtained from the base of the skull through the vertex without intravenous contrast. RADIATION DOSE REDUCTION: This exam was performed according to the departmental dose-optimization program which includes automated exposure control, adjustment of the  mA and/or kV according to patient size and/or use of iterative reconstruction technique. COMPARISON:  Previous studies including MR brain done on 01/30/2022 and CT done on 01/22/2022 FINDINGS: Brain: No acute intracranial findings are seen. There are no signs of bleeding within the cranium. Cavum septum vergae and cavum septum pellucidum are noted. Ventricles are not dilated. There are old lacunar infarcts in basal ganglia on both sides with no significant interval change. Vascular: Scattered arterial calcifications are seen. Skull: Unremarkable. Sinuses/Orbits: Unremarkable. Other: None IMPRESSION: No acute intracranial findings are seen in noncontrast CT brain. Electronically Signed   By: Ernie Avena M.D.   On: 03/23/2022 15:42   DG Chest Port 1 View  Result Date: 03/23/2022 CLINICAL DATA:  Tachycardia EXAM: PORTABLE CHEST 1 VIEW COMPARISON:  02/20/2022 FINDINGS: Cardiomegaly. Both lungs are clear. The visualized skeletal structures are  unremarkable. IMPRESSION: Cardiomegaly without acute abnormality of the lungs in AP portable projection. Electronically Signed   By: Jearld Lesch M.D.   On: 03/23/2022 15:32   DG Knee Left Port  Result Date: 03/12/2022 CLINICAL DATA:  Left knee pain. EXAM: PORTABLE LEFT KNEE - 1-2 VIEW COMPARISON:  None Available. FINDINGS: Significant age advanced tricompartmental degenerative changes most notable involving the medial compartment. There is joint space narrowing, osteophytic spurring and subchondral cystic change. No acute bony findings.  Very small joint effusion. 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 No acute bony findings. Electronically Signed   By: Rudie Meyer M.D.   On: 03/12/2022 13:04   DG Swallowing Func-Speech Pathology  Result Date: 02/28/2022 Table formatting from the original result was not included. Objective Swallowing Evaluation: Type of Study: MBS-Modified Barium Swallow Study  Patient Details Name: Jenan Ellegood MRN: [id number] Date of Birth: [birth year] Today's Date: 02/28/2022 Time: SLP Start Time (ACUTE ONLY): 1245 -SLP Stop Time (ACUTE ONLY): 1315 SLP Time Calculation (min) (ACUTE ONLY): 30 min Past Medical History: Past Medical History: Diagnosis Date  Hypertension   Hypertensive urgency 01/20/2022  Seizure (HCC) 01/28/2022  TIA (transient ischemic attack) 2019  Urinary tract infection 01/28/2022 Past Surgical History: Past Surgical History: Procedure Laterality Date  ESOPHAGOGASTRODUODENOSCOPY N/A 01/28/2022  Procedure: ESOPHAGOGASTRODUODENOSCOPY (EGD);  Surgeon: Diamantina Monks, MD;  Location: [facility] ENDOSCOPY;  Service: General;  Laterality: N/A;  PEG PLACEMENT N/A 01/28/2022  Procedure: PERCUTANEOUS ENDOSCOPIC GASTROSTOMY (PEG) PLACEMENT;  Surgeon: Diamantina Monks, MD;  Location: [facility] ENDOSCOPY;  Service: General;  Laterality: N/A; HPI: Pt is a 51 y/o female who presented to the ED after being found unresponsive in her care. Pt admitted 4/9 with thalamic  stroke, treated for possible acute metabolic and wernicke's encphalopathy as well. She required intubation for airway protection on admission and had a tracheostomy placed on 4/16.  PEG on 4/18. MRI 4/19: Multiple foci of restricted diffusion in the bilateral frontal lobes and parietal lobes primarily. Started tolerating ATC on 4/22. Has suffered from severe encephalopathy, neurostorming. Started following commands around 5/15. A time of evaluation, pt with a 4 cuffless trach which is capped and may be decannulated within the next 24-48 hours. PMH: HTN, TIA, ETOH abuse and medication noncompliance.  No data recorded  Recommendations for follow up therapy are one component of a multi-disciplinary discharge planning process, led by the attending physician.  Recommendations may be updated based on patient status, additional functional criteria and insurance authorization. Assessment / Plan / Recommendation   02/28/2022  12:38 PM Clinical Impressions Clinical Impression Pt was lethargic throughout the study despite sternal rub and thermotactile stimulation with cold washcloth,  and pt exhibited difficulty maintaining an adequate level of alertness once improved alertness was achieved. SLP suspects that this, combined with the flavor the barium, likely impacted pt's performance during the study. However, the study was somewhat limited due to pt's lethargy. She exhibited worse bolus awareness than has been exhibited at bedside, and significant oral holding was noted with purees and thin liquids. Oral suctioning was necessary once due to the extent of oral holding. Minimal premature spillage to the valleculae was noted with a thin liquid bolus via straw; however, pt was otherwise able to contain large liquid boluses in the oral cavity with no instances of premature spillage even when a posterior head tilt was facilitated. With verbal and tactile prompts and cues, once the swallow was triggered, pharyngeal clearance was WNL  and no instances of penetration/aspiration were demonstrated. Considering pt's current lethargy, diet initiation will be deferred today. However, once pt's presentation is more similar to that noted this morning in treatment, a dysphagia 1 diet with thin liquids will likely be initiated. SLP Visit Diagnosis Dysphagia, oral phase (R13.11) Impact on safety and function Mild aspiration risk     02/28/2022  12:38 PM Treatment Recommendations Treatment Recommendations Therapy as outlined in treatment plan below     02/28/2022  12:38 PM Prognosis Prognosis for Safe Diet Advancement Good Barriers to Reach Goals Cognitive deficits;Language deficits;Time post onset   02/28/2022  12:38 PM Diet Recommendations SLP Diet Recommendations Dysphagia 1 (Puree) solids;Thin liquid Liquid Administration via Cup;Straw Medication Administration Via alternative means Compensations Slow rate;Small sips/bites;Minimize environmental distractions Postural Changes Seated upright at 90 degrees     02/28/2022  12:38 PM Other Recommendations Oral Care Recommendations Oral care BID Follow Up Recommendations Skilled nursing-short term rehab (<3 hours/day) Assistance recommended at discharge Frequent or constant Supervision/Assistance Functional Status Assessment Patient has had a recent decline in their functional status and demonstrates the ability to make significant improvements in function in a reasonable and predictable amount of time.   02/28/2022  12:38 PM Frequency and Duration  Speech Therapy Frequency (ACUTE ONLY) min 2x/week Treatment Duration 2 weeks     02/28/2022  12:38 PM Oral Phase Oral Phase Impaired Oral - Thin Cup Delayed oral transit;Holding of bolus Oral - Thin Straw Delayed oral transit;Holding of bolus Oral - Puree Delayed oral transit;Holding of bolus    02/28/2022  12:38 PM Pharyngeal Phase Pharyngeal Phase [facility]    02/28/2022  12:38 PM Cervical Esophageal Phase  Cervical Esophageal Phase [facility] Shanika I. Hardin Negus, [place], [place] Office number [phone] Pager [phone] Horton Marshall 02/28/2022, 1:51 PM                       Time coordinating discharge: Over 63 minutes    Cindy Dee, MD  [facility] 03/26/2022, 12:49 PM

## 2022-03-26 NOTE — TOC Transition Note (Signed)
Transition of Care [facility]) - CM/SW Discharge Note   Patient Details  Name: Cindy Brooks MRN: [id number] Date of Birth: December 06, 1970  Transition of Care [facility]) CM/SW Contact:  Kermit Balo, RN Phone Number: 03/26/2022, 1:23 PM   Clinical Narrative:    Patient discharging back home with resumption of home health services through [facility]. Morrie Sheldon with [facility] aware of new HH orders.  CM has sent toomey syringes home with patient for daughter to flush PEG tube until seen by [facility] RN.  New medications for home delivered to the room per [facility] pharmacy and will be sent with patient so she will have as prescribed.  Address for home verified with patients daughter and PTAR arranged for transport home. Bedside RN updated.   Final next level of care: Home w Home Health Services Barriers to Discharge: Continued Medical Work up   Patient Goals and CMS Choice        Discharge Placement                       Discharge Plan and Services   Discharge Planning Services: CM Consult Post Acute Care Choice: Home Health                    HH Arranged: RN, PT, OT [facility] Agency: [facility] Date HH Agency Contacted: 03/26/22   Representative spoke with at [facility] Agency: Morrie Sheldon  Social Determinants of Health (SDOH) Interventions     Readmission Risk Interventions    03/13/2022    9:03 AM  Readmission Risk Prevention Plan  Transportation Screening Complete  PCP or Specialist Appt within 5-7 Days Complete  Home Care Screening Complete  Medication Review (RN CM) Complete

## 2022-03-26 NOTE — Plan of Care (Signed)
  Problem: Education: Goal: Knowledge of General Education information will improve Description: Including pain rating scale, medication(s)/side effects and non-pharmacologic comfort measures Outcome: Adequate for Discharge   Problem: Health Behavior/Discharge Planning: Goal: Ability to manage health-related needs will improve Outcome: Adequate for Discharge   Problem: Clinical Measurements: Goal: Ability to maintain clinical measurements within normal limits will improve Outcome: Adequate for Discharge Goal: Will remain free from infection Outcome: Adequate for Discharge Goal: Diagnostic test results will improve Outcome: Adequate for Discharge Goal: Respiratory complications will improve Outcome: Adequate for Discharge Goal: Cardiovascular complication will be avoided Outcome: Adequate for Discharge   Problem: Activity: Goal: Risk for activity intolerance will decrease Outcome: Adequate for Discharge   Problem: Nutrition: Goal: Adequate nutrition will be maintained Outcome: Adequate for Discharge   Problem: Coping: Goal: Level of anxiety will decrease Outcome: Adequate for Discharge   Problem: Elimination: Goal: Will not experience complications related to bowel motility Outcome: Adequate for Discharge Goal: Will not experience complications related to urinary retention Outcome: Adequate for Discharge   Problem: Pain Managment: Goal: General experience of comfort will improve Outcome: Adequate for Discharge   Problem: Safety: Goal: Ability to remain free from injury will improve Outcome: Adequate for Discharge   Problem: Skin Integrity: Goal: Risk for impaired skin integrity will decrease Outcome: Adequate for Discharge   Problem: Urinary Elimination: Goal: Signs and symptoms of infection will decrease Outcome: Adequate for Discharge   

## 2022-03-27 LAB — T3, FREE: T3, Free: 2.8 pg/mL (ref 2.0–4.4)

## 2022-03-28 LAB — URINE CULTURE: Culture: >=100000 — AB

## 2022-03-28 LAB — CULTURE, BLOOD (ROUTINE X 2)
Culture: NO GROWTH
Culture: NO GROWTH
Special Requests: ADEQUATE

## 2022-03-29 ENCOUNTER — Telehealth: Payer: Self-pay

## 2022-03-29 NOTE — Telephone Encounter (Signed)
Home Health Verbal Orders - Caller/Agency: [facility]/ [facility]  Callback Number: [phone] secure v per caller  Requesting OT/PT/Skilled Nursing/Social Work/Speech Therapy: PT  Frequency: 1w1 2w6 1w2 beginning 03-29-2022

## 2022-04-01 NOTE — Telephone Encounter (Signed)
Please give verbal orders for this. Thanks

## 2022-04-02 NOTE — Telephone Encounter (Signed)
Verbal orders given to [facility] with [facility].

## 2022-04-03 ENCOUNTER — Telehealth: Payer: Self-pay

## 2022-04-03 NOTE — Telephone Encounter (Signed)
Copied from CRM [id number]. Topic: General - Other >> Apr 03, 2022  9:21 AM Everette C wrote: Reason for CRM: Cecilia with [facility] would like to speak with T. Sharlet Salina when possible  Mare Loan has additional questions regarding previously discussed orders  Please contact further

## 2022-04-03 NOTE — Telephone Encounter (Signed)
LMVM to return call

## 2022-04-03 NOTE — Telephone Encounter (Signed)
For Eugene Gavia, [facility] re this verbal order.   Wanted to fu this verbal order states [employer]; worker and she had ask for a Home Health Aid instead, Questions? Can call tomorrow to her [phone]

## 2022-04-04 NOTE — Telephone Encounter (Signed)
Spoke to [place], order were clarified- PT only

## 2022-04-04 NOTE — Telephone Encounter (Signed)
Spoke to Cecelia, order were clarified- PT only 

## 2022-04-17 ENCOUNTER — Inpatient Hospital Stay: Payer: 59 | Admitting: Nurse Practitioner

## 2022-04-19 ENCOUNTER — Inpatient Hospital Stay (HOSPITAL_COMMUNITY)
Admission: EM | Admit: 2022-04-19 | Discharge: 2022-04-21 | DRG: 698 | Disposition: A | Discharge status: Home Health | Payer: 59 | Attending: Internal Medicine | Admitting: Internal Medicine

## 2022-04-19 ENCOUNTER — Ambulatory Visit: Payer: Self-pay

## 2022-04-19 ENCOUNTER — Emergency Department (HOSPITAL_COMMUNITY): Payer: 59

## 2022-04-19 ENCOUNTER — Telehealth: Payer: Self-pay

## 2022-04-19 ENCOUNTER — Encounter (HOSPITAL_COMMUNITY): Payer: Self-pay

## 2022-04-19 ENCOUNTER — Other Ambulatory Visit: Payer: Self-pay

## 2022-04-19 DIAGNOSIS — Z885 Allergy status to narcotic agent status: Secondary | ICD-10-CM | POA: Diagnosis not present

## 2022-04-19 DIAGNOSIS — Z7902 Long term (current) use of antithrombotics/antiplatelets: Secondary | ICD-10-CM

## 2022-04-19 DIAGNOSIS — I10 Essential (primary) hypertension: Secondary | ICD-10-CM | POA: Diagnosis present

## 2022-04-19 DIAGNOSIS — E870 Hyperosmolality and hypernatremia: Secondary | ICD-10-CM

## 2022-04-19 DIAGNOSIS — T83511A Infection and inflammatory reaction due to indwelling urethral catheter, initial encounter: Secondary | ICD-10-CM | POA: Diagnosis not present

## 2022-04-19 DIAGNOSIS — Z931 Gastrostomy status: Secondary | ICD-10-CM | POA: Diagnosis not present

## 2022-04-19 DIAGNOSIS — B964 Proteus (mirabilis) (morganii) as the cause of diseases classified elsewhere: Secondary | ICD-10-CM | POA: Diagnosis present

## 2022-04-19 DIAGNOSIS — Y846 Urinary catheterization as the cause of abnormal reaction of the patient, or of later complication, without mention of misadventure at the time of the procedure: Secondary | ICD-10-CM | POA: Diagnosis present

## 2022-04-19 DIAGNOSIS — E785 Hyperlipidemia, unspecified: Secondary | ICD-10-CM | POA: Diagnosis present

## 2022-04-19 DIAGNOSIS — T83518A Infection and inflammatory reaction due to other urinary catheter, initial encounter: Secondary | ICD-10-CM | POA: Diagnosis present

## 2022-04-19 DIAGNOSIS — Z833 Family history of diabetes mellitus: Secondary | ICD-10-CM

## 2022-04-19 DIAGNOSIS — N390 Urinary tract infection, site not specified: Secondary | ICD-10-CM | POA: Diagnosis present

## 2022-04-19 DIAGNOSIS — Z1152 Encounter for screening for COVID-19: Secondary | ICD-10-CM | POA: Diagnosis not present

## 2022-04-19 DIAGNOSIS — Z7401 Bed confinement status: Secondary | ICD-10-CM

## 2022-04-19 DIAGNOSIS — I69391 Dysphagia following cerebral infarction: Secondary | ICD-10-CM

## 2022-04-19 DIAGNOSIS — Z880 Allergy status to penicillin: Secondary | ICD-10-CM

## 2022-04-19 DIAGNOSIS — E878 Other disorders of electrolyte and fluid balance, not elsewhere classified: Secondary | ICD-10-CM | POA: Diagnosis present

## 2022-04-19 DIAGNOSIS — Z79899 Other long term (current) drug therapy: Secondary | ICD-10-CM | POA: Diagnosis not present

## 2022-04-19 DIAGNOSIS — E860 Dehydration: Secondary | ICD-10-CM | POA: Diagnosis present

## 2022-04-19 DIAGNOSIS — A419 Sepsis, unspecified organism: Secondary | ICD-10-CM | POA: Diagnosis not present

## 2022-04-19 DIAGNOSIS — Z886 Allergy status to analgesic agent status: Secondary | ICD-10-CM

## 2022-04-19 DIAGNOSIS — A4159 Other Gram-negative sepsis: Secondary | ICD-10-CM | POA: Diagnosis present

## 2022-04-19 DIAGNOSIS — R1310 Dysphagia, unspecified: Secondary | ICD-10-CM | POA: Diagnosis present

## 2022-04-19 HISTORY — DX: Cerebral infarction, unspecified: I63.9

## 2022-04-19 LAB — URINALYSIS, ROUTINE W REFLEX MICROSCOPIC
Glucose, UA: NEGATIVE mg/dL
Hgb urine dipstick: NEGATIVE
Ketones, ur: 20 mg/dL — AB
Nitrite: NEGATIVE
Protein, ur: >=300 mg/dL — AB
Specific Gravity, Urine: 1.016 (ref 1.005–1.030)
WBC, UA: >50 WBC/hpf — ABNORMAL HIGH (ref 0–5)
pH: 9 — ABNORMAL HIGH (ref 5.0–8.0)

## 2022-04-19 LAB — CBC WITH DIFFERENTIAL/PLATELET
Abs Immature Granulocytes: 0.02 10*3/uL (ref 0.00–0.07)
Basophils Absolute: 0.1 10*3/uL (ref 0.0–0.1)
Basophils Relative: 1 %
Eosinophils Absolute: 0 10*3/uL (ref 0.0–0.5)
Eosinophils Relative: 0 %
HCT: 45 % (ref 36.0–46.0)
Hemoglobin: 14.6 g/dL (ref 12.0–15.0)
Immature Granulocytes: 0 %
Lymphocytes Relative: 25 %
Lymphs Abs: 2.1 10*3/uL (ref 0.7–4.0)
MCH: 26.4 pg (ref 26.0–34.0)
MCHC: 32.4 g/dL (ref 30.0–36.0)
MCV: 81.4 fL (ref 80.0–100.0)
Monocytes Absolute: 0.6 10*3/uL (ref 0.1–1.0)
Monocytes Relative: 7 %
Neutro Abs: 5.7 10*3/uL (ref 1.7–7.7)
Neutrophils Relative %: 67 %
Platelets: 284 10*3/uL (ref 150–400)
RBC: 5.53 MIL/uL — ABNORMAL HIGH (ref 3.87–5.11)
RDW: 16.4 % — ABNORMAL HIGH (ref 11.5–15.5)
WBC: 8.6 10*3/uL (ref 4.0–10.5)
nRBC: 0 % (ref 0.0–0.2)

## 2022-04-19 LAB — COMPREHENSIVE METABOLIC PANEL WITH GFR
ALT: 13 U/L (ref 0–44)
AST: 15 U/L (ref 15–41)
Albumin: 4.2 g/dL (ref 3.5–5.0)
Alkaline Phosphatase: 83 U/L (ref 38–126)
Anion gap: 13 (ref 5–15)
BUN: 20 mg/dL (ref 6–20)
CO2: 21 mmol/L — ABNORMAL LOW (ref 22–32)
Calcium: 9.7 mg/dL (ref 8.9–10.3)
Chloride: 122 mmol/L — ABNORMAL HIGH (ref 98–111)
Creatinine, Ser: 1.09 mg/dL — ABNORMAL HIGH (ref 0.44–1.00)
GFR, Estimated: >60 mL/min
Glucose, Bld: 103 mg/dL — ABNORMAL HIGH (ref 70–99)
Potassium: 3.8 mmol/L (ref 3.5–5.1)
Sodium: 156 mmol/L — ABNORMAL HIGH (ref 135–145)
Total Bilirubin: 1.2 mg/dL (ref 0.3–1.2)
Total Protein: 8.2 g/dL — ABNORMAL HIGH (ref 6.5–8.1)

## 2022-04-19 LAB — APTT: aPTT: 28 s (ref 24–36)

## 2022-04-19 LAB — RESP PANEL BY RT-PCR (FLU A&B, COVID) ARPGX2
Influenza A by PCR: NEGATIVE
Influenza B by PCR: NEGATIVE
SARS Coronavirus 2 by RT PCR: NEGATIVE

## 2022-04-19 LAB — I-STAT BETA HCG BLOOD, ED (MC, WL, AP ONLY): I-stat hCG, quantitative: <5 m[IU]/mL (ref <5)

## 2022-04-19 LAB — PROTIME-INR
INR: 1.2 (ref 0.8–1.2)
Prothrombin Time: 15.2 seconds (ref 11.4–15.2)

## 2022-04-19 LAB — LACTIC ACID, PLASMA: Lactic Acid, Venous: 1.4 mmol/L (ref 0.5–1.9)

## 2022-04-19 MED ORDER — FOLIC ACID 1 MG PO TABS
1.0000 mg | ORAL_TABLET | Freq: Every day | ORAL | Status: DC
  Administered 2022-04-20 – 2022-04-21 (×2): 1 mg via ORAL
  Filled 2022-04-19 (×2): qty 1

## 2022-04-19 MED ORDER — AMANTADINE HCL 100 MG PO CAPS
100.0000 mg | ORAL_CAPSULE | Freq: Two times a day (BID) | ORAL | Status: DC
  Administered 2022-04-19 – 2022-04-21 (×4): 100 mg via ORAL
  Filled 2022-04-19 (×5): qty 1

## 2022-04-19 MED ORDER — ACETAMINOPHEN 325 MG PO TABS: 650.0000 mg | ORAL_TABLET | ORAL | Status: DC | PRN

## 2022-04-19 MED ORDER — GLYCOPYRROLATE 1 MG PO TABS
0.5000 mg | ORAL_TABLET | Freq: Two times a day (BID) | ORAL | Status: DC
  Administered 2022-04-19 – 2022-04-21 (×5): 0.5 mg via ORAL
  Filled 2022-04-19 (×6): qty 1

## 2022-04-19 MED ORDER — LACTATED RINGERS IV BOLUS (SEPSIS)
1000.0000 mL | Freq: Once | INTRAVENOUS | Status: AC
  Administered 2022-04-19: 1000 mL via INTRAVENOUS

## 2022-04-19 MED ORDER — AMLODIPINE BESYLATE 10 MG PO TABS
10.0000 mg | ORAL_TABLET | Freq: Every day | ORAL | Status: DC
  Administered 2022-04-20 – 2022-04-21 (×2): 10 mg via ORAL
  Filled 2022-04-19 (×2): qty 1

## 2022-04-19 MED ORDER — SODIUM CHLORIDE 0.45 % IV SOLN: INTRAVENOUS | Status: DC

## 2022-04-19 MED ORDER — METRONIDAZOLE 500 MG/100ML IV SOLN: 500.0000 mg | Freq: Once | INTRAVENOUS | Status: DC

## 2022-04-19 MED ORDER — HYDRALAZINE HCL 50 MG PO TABS
50.0000 mg | ORAL_TABLET | Freq: Three times a day (TID) | ORAL | Status: DC
  Administered 2022-04-19 – 2022-04-21 (×6): 50 mg via ORAL
  Filled 2022-04-19 (×3): qty 1
  Filled 2022-04-19: qty 2
  Filled 2022-04-19 (×2): qty 1

## 2022-04-19 MED ORDER — VANCOMYCIN HCL IN DEXTROSE 1-5 GM/200ML-% IV SOLN: 1000.0000 mg | Freq: Once | INTRAVENOUS | Status: DC

## 2022-04-19 MED ORDER — POLYETHYLENE GLYCOL 3350 17 G PO PACK: 17.0000 g | PACK | Freq: Two times a day (BID) | ORAL | Status: DC | PRN

## 2022-04-19 MED ORDER — LACTATED RINGERS IV SOLN: INTRAVENOUS | Status: DC

## 2022-04-19 MED ORDER — PROPRANOLOL HCL 20 MG PO TABS
40.0000 mg | ORAL_TABLET | Freq: Three times a day (TID) | ORAL | Status: DC
  Administered 2022-04-19 – 2022-04-21 (×7): 40 mg via ORAL
  Filled 2022-04-19 (×2): qty 2
  Filled 2022-04-19 (×2): qty 1
  Filled 2022-04-19 (×4): qty 2

## 2022-04-19 MED ORDER — SENNOSIDES-DOCUSATE SODIUM 8.6-50 MG PO TABS: 1.0000 | ORAL_TABLET | Freq: Two times a day (BID) | ORAL | Status: DC | PRN

## 2022-04-19 MED ORDER — IPRATROPIUM-ALBUTEROL 0.5-2.5 (3) MG/3ML IN SOLN: 3.0000 mL | Freq: Four times a day (QID) | RESPIRATORY_TRACT | Status: DC | PRN

## 2022-04-19 MED ORDER — ATORVASTATIN CALCIUM 80 MG PO TABS
80.0000 mg | ORAL_TABLET | Freq: Every day | ORAL | Status: DC
  Administered 2022-04-20 – 2022-04-21 (×2): 80 mg via ORAL
  Filled 2022-04-19 (×2): qty 1

## 2022-04-19 MED ORDER — THIAMINE HCL 100 MG PO TABS
100.0000 mg | ORAL_TABLET | Freq: Every day | ORAL | Status: DC
  Administered 2022-04-20 – 2022-04-21 (×2): 100 mg via ORAL
  Filled 2022-04-19 (×2): qty 1

## 2022-04-19 MED ORDER — SODIUM CHLORIDE 0.9 % IV SOLN: 2.0000 g | Freq: Once | INTRAVENOUS | Status: DC

## 2022-04-19 MED ORDER — SODIUM CHLORIDE 0.9 % IV SOLN
2.0000 g | INTRAVENOUS | Status: DC
  Administered 2022-04-19: 2 g via INTRAVENOUS
  Filled 2022-04-19: qty 20

## 2022-04-19 MED ORDER — ENSURE ENLIVE PO LIQD
237.0000 mL | Freq: Three times a day (TID) | ORAL | Status: DC
Start: 2022-04-19 — End: 2022-04-21
  Administered 2022-04-20 – 2022-04-21 (×5): 237 mL via ORAL
  Filled 2022-04-19: qty 237

## 2022-04-19 MED ORDER — GABAPENTIN 100 MG PO CAPS
100.0000 mg | ORAL_CAPSULE | Freq: Two times a day (BID) | ORAL | Status: DC
  Administered 2022-04-19 – 2022-04-21 (×4): 100 mg via ORAL
  Filled 2022-04-19 (×4): qty 1

## 2022-04-19 MED ORDER — ENOXAPARIN SODIUM 40 MG/0.4ML IJ SOSY
40.0000 mg | PREFILLED_SYRINGE | INTRAMUSCULAR | Status: DC
  Administered 2022-04-19 – 2022-04-20 (×2): 40 mg via SUBCUTANEOUS
  Filled 2022-04-19 (×3): qty 0.4

## 2022-04-19 MED ORDER — LACTATED RINGERS IV BOLUS (SEPSIS)
500.0000 mL | Freq: Once | INTRAVENOUS | Status: AC
Start: 2022-04-19 — End: 2022-04-19
  Administered 2022-04-19: 500 mL via INTRAVENOUS

## 2022-04-19 MED ORDER — CEFTRIAXONE SODIUM 1 G IJ SOLR
1.0000 g | INTRAMUSCULAR | Status: DC
  Administered 2022-04-20 – 2022-04-21 (×2): 1 g via INTRAVENOUS
  Filled 2022-04-19 (×2): qty 10

## 2022-04-19 MED ORDER — CLOPIDOGREL BISULFATE 75 MG PO TABS
75.0000 mg | ORAL_TABLET | Freq: Every day | ORAL | Status: DC
  Administered 2022-04-20 – 2022-04-21 (×2): 75 mg via ORAL
  Filled 2022-04-19 (×2): qty 1

## 2022-04-19 NOTE — Assessment & Plan Note (Addendum)
-   Febrile, tachycardia, tachypnea.  Source considered urinary - Foley catheter appears to have been exchanged on admission -Urine culture reviewed.  Growing Proteus.  Sensitivities reviewed - Treated with Rocephin during hospitalization, de-escalate to cefdinir to complete course at discharge

## 2022-04-19 NOTE — Telephone Encounter (Signed)
error 

## 2022-04-19 NOTE — ED Notes (Signed)
Report received from [place], [place]. Patient resting in bed. Call light in reach. Foley cath in place draining to gravity. Patient is nonverbal per report. IVF infusing w/o difficulty. Cardiac monitor in place. Call light in reach.

## 2022-04-19 NOTE — Assessment & Plan Note (Signed)
Continue PEG feedings 

## 2022-04-19 NOTE — ED Notes (Signed)
Messaged Dr. Debby Bud to clarify medications via feeding tube or oral, he stated medications were to be given oral.

## 2022-04-19 NOTE — Sepsis Progress Note (Signed)
Elink follow code sepsis  Judeth Cornfield taking over for [employer]

## 2022-04-19 NOTE — ED Notes (Signed)
Unable to get patient blood, the nurse was informed.

## 2022-04-19 NOTE — Telephone Encounter (Signed)
Chief Complaint: elevated BP, elevated HR dark urine Symptoms: ibid Frequency: unsure - dark urine 1-2 days, BP and HR elevation unsure Pertinent Negatives: Patient denies PT is non verbal Disposition: [x] ED /[] Urgent Care (no appt availability in office) / [] Appointment(In office/virtual)/ []  [facility]/ [] Home Care/ [] Refused Recommended Disposition /[] [place] [facility]/ []  Follow-up with PCP Additional Notes: Received call from [facility] OT in home for therapy. Kayla reports dark urine, elevated BP and elevated HR. PT took HTN medication at 9:30 with little improvement. BP at arrival was 158/108 HR 111,123,125. At 9:51 am  BP was 152/110 HR 124. PT has 1200 mL  of dark urine with white flakes in foley bag. Son-in -law, reports that urine became dark over the last 2 days.  also reports reduced intake of water form 64oz of water to about 40 oz over the past few days. Pt is none verbal, and was unable to answer questions non-verbally regarding any other S/S, i.e HA, chest pain. Pt is usually able to respond to questions non verbally. Pt cannot be transported by car. will call EMS for assessment and possible transport.  Reason for Disposition  Systolic BP  >= 160 OR Diastolic >= 100  Tea-colored or slightly red urine lasts > 24 hours AND [2] not cleared by increasing fluid intake    AND [3] no recent prostate or bladder surgery  [1] Cloudy urine lasts > 24 hours AND [2] not cleared by increasing fluid intake  Age > 60 years (Exception: brief heartbeat symptoms that went away and now feels well)  Answer Assessment - Initial Assessment Questions 1. BLOOD PRESSURE: "What is the blood pressure?" "Did you take at least two measurements 5 minutes apart?"     158/108 at 9:25 am, 9:51 152/110 2. ONSET: "When did you take your blood pressure?"     9:25am  3. HOW: "How did you obtain the blood pressure?" (e.g., visiting nurse, automatic home BP monitor)      Manual 4. HISTORY: "Do you have a history of high blood pressure?"     yes 5. MEDICATIONS: "Are you taking any medications for blood pressure?" "Have you missed any doses recently?"     Yes took medications at 9:30 am 6. OTHER SYMPTOMS: "Do you have any symptoms?" (e.g., headache, chest pain, blurred vision, difficulty breathing, weakness)     Blurred vision at baseline.  7. PREGNANCY: "Is there any chance you are pregnant?" "When was your last menstrual period?"     no  Answer Assessment - Initial Assessment Questions 1. SYMPTOMS: "What symptoms are you concerned about?"     Dark urine -  2. ONSET:  "When did the symptoms start?"     1-2 days ago 3. FEVER: "Is there a fever?" If Yes, ask: "What is the temperature, how was it measured, and when did it start?"     97.0  4. ABDOMINAL PAIN: "Is there any abdominal pain?" (e.g., Scale 1-10; or mild, moderate, severe)     Unknown 5. URINE COLOR: "What color is the urine?"  "Is there blood present in the urine?" (e.g., clear, yellow, cloudy, tea-colored, blood streaks, bright red)     Dark with white sediment 6. URINE AMOUNT: "When did you last empty the urine from the collection bag?" "How much urine was in the bag at that time?" How much urine is in the collection bag now?"     1200 7. INSERTION: "How long have you (they) had the catheter?"  4 weeks 8. OTHER SYMPTOMS: "Do you (they) have any other symptoms?" (e.g., back pain, bad urine odor,  bladder spasms, leaking of urine, constipation, distended abdomen)      unknown 9. MEDICINES: "Are you taking any medicines to treat urinary problems?" (e.g., antibiotics for a urinary tract infection, medicines to treat bladder spasms)      no 10. PREGNANCY: "Is there any chance you are pregnant?" "When was your last menstrual period?"       na  Answer Assessment - Initial Assessment Questions 1. DESCRIPTION: "Please describe your heart rate or heartbeat that you are having" (e.g., fast/slow,  regular/irregular, skipped or extra beats, "palpitations")     HR is 124 2. ONSET: "When did it start?" (Minutes, hours or days)      unsure 3. DURATION: "How long does it last" (e.g., seconds, minutes, hours)     Ongoing since 9am 4. PATTERN "Does it come and go, or has it been constant since it started?"  "Does it get worse with exertion?"   "Are you feeling it now?"     unknown 5. TAP: "Using your hand, can you tap out what you are feeling on a chair or table in front of you, so that I can hear?" (Note: not all patients can do this)        6. HEART RATE: "Can you tell me your heart rate?" "How many beats in 15 seconds?"  (Note: not all patients can do this)        7. RECURRENT SYMPTOM: "Have you ever had this before?" If Yes, ask: "When was the last time?" and "What happened that time?"       8. CAUSE: "What do you think is causing the palpitations?"      9. CARDIAC HISTORY: "Do you have any history of heart disease?" (e.g., heart attack, angina, bypass surgery, angioplasty, arrhythmia)       10. OTHER SYMPTOMS: "Do you have any other symptoms?" (e.g., dizziness, chest pain, sweating, difficulty breathing)        11. PREGNANCY: "Is there any chance you are pregnant?" "When was your last menstrual period?"       na  Protocols used: Blood Pressure - High-A-AH, Urinary Catheter (e.g., Foley) Symptoms and Questions-A-AH, Heart Rate and Heartbeat Questions-A-AH

## 2022-04-19 NOTE — Assessment & Plan Note (Signed)
BP stable. Continue home regimen 

## 2022-04-19 NOTE — Telephone Encounter (Signed)
Encounter documented in triage note.

## 2022-04-19 NOTE — ED Notes (Signed)
Per Dr. Debby Bud, after speaking with family, determined that pt normally takes pills and food PO, okay to administer meds by mouth during this admission

## 2022-04-19 NOTE — ED Notes (Signed)
Report given to [place], Charity fundraiser. All questions answered. Patient transported to room via stretcher.

## 2022-04-19 NOTE — ED Provider Notes (Signed)
[facility] EMERGENCY DEPARTMENT Provider Note   CSN: [id number] Arrival date & time: 04/19/22  1121     History  No chief complaint on file.   Cindy Brooks is a 51 y.o. female.  The history is provided by the EMS personnel and medical records. The history is limited by the condition of the patient. No language interpreter was used.  Fever Max temp prior to arrival:  Warm to touch Temp source:  Subjective Onset quality:  Gradual Timing:  Constant Progression:  Unable to specify Relieved by:  Nothing Worsened by:  Nothing Ineffective treatments:  None tried Associated symptoms: no chest pain, no congestion, no cough, no diarrhea and no vomiting        Home Medications Prior to Admission medications   Medication Sig Start Date End Date Taking? Authorizing Provider  acetaminophen (TYLENOL) 325 MG tablet Take 2 tablets (650 mg total) by mouth every 4 (four) hours as needed. 03/21/22 03/21/23  Almon Hercules, MD  amantadine (SYMMETREL) 100 MG capsule Take 1 capsule (100 mg total) by mouth 2 (two) times daily. 03/21/22 05/20/22  Almon Hercules, MD  amLODipine (NORVASC) 10 MG tablet Take 1 tablet (10 mg total) by mouth daily. 03/21/22   Almon Hercules, MD  atorvastatin (LIPITOR) 80 MG tablet Take 1 tablet (80 mg total) by mouth daily. 03/21/22   Almon Hercules, MD  clopidogrel (PLAVIX) 75 MG tablet Take 1 tablet (75 mg total) by mouth daily. 03/21/22   Almon Hercules, MD  feeding supplement (ENSURE ENLIVE / ENSURE PLUS) LIQD Take 237 mLs by mouth 3 (three) times daily between meals. 03/21/22 04/20/22  Almon Hercules, MD  folic acid (FOLVITE) 1 MG tablet Take 1 tablet (1 mg total) by mouth daily. 03/21/22   Almon Hercules, MD  gabapentin (NEURONTIN) 100 MG capsule Take 1 capsule (100 mg total) by mouth 2 (two) times daily. 03/21/22   Almon Hercules, MD  glycopyrrolate (ROBINUL) 1 MG tablet Take 0.5 tablets (0.5 mg total) by mouth 2 (two) times daily. 03/21/22   Almon Hercules, MD  hydrALAZINE  (APRESOLINE) 50 MG tablet Take 1 tablet (50 mg total) by mouth every 8 (eight) hours. 03/21/22 05/20/22  Almon Hercules, MD  ipratropium-albuterol (DUONEB) 0.5-2.5 (3) MG/3ML SOLN Take 3 mLs by nebulization every 6 (six) hours as needed. Patient taking differently: Take 3 mLs by nebulization every 6 (six) hours as needed (shortness of breath, wheezing). 03/21/22   Almon Hercules, MD  Multiple Vitamin (MULTIVITAMIN WITH MINERALS) TABS tablet Take 1 tablet by mouth daily. 03/21/22   Almon Hercules, MD  Nystatin (GERHARDT'S BUTT CREAM) CREA Apply 1 application. topically daily as needed for irritation. 03/21/22   Almon Hercules, MD  polyethylene glycol powder (MIRALAX) 17 GM/SCOOP powder Take 17 g by mouth 2 (two) times daily as needed for moderate constipation. 03/21/22   Almon Hercules, MD  propranolol (INDERAL) 40 MG tablet Take 1 tablet (40 mg total) by mouth 3 (three) times daily. 03/21/22 05/20/22  Almon Hercules, MD  senna-docusate (SENOKOT-S) 8.6-50 MG tablet Take 1 tablet by mouth 2 (two) times daily between meals as needed for mild constipation. 03/21/22   Almon Hercules, MD  thiamine 100 MG tablet Take 1 tablet (100 mg total) by mouth daily. 03/21/22   Almon Hercules, MD      Allergies    Aspirin, Codeine, Penicillins, and Strawberry extract    Review of Systems  Review of Systems  Unable to perform ROS: Patient nonverbal  Constitutional:  Positive for fever.  HENT:  Negative for congestion.   Respiratory:  Negative for cough and chest tightness.   Cardiovascular:  Negative for chest pain and palpitations.  Gastrointestinal:  Negative for diarrhea and vomiting.  Genitourinary:        Mush darker and soul smelling urine  Skin:  Negative for wound.  Neurological:  Negative for seizures.  Psychiatric/Behavioral:  Negative for agitation.     Physical Exam Updated Vital Signs BP (!) 123/97   Pulse (!) 104   Resp (!) 30   Ht 5\' 4"  (1.626 m)   Wt 79.2 kg   LMP  (LMP Unknown)   SpO2 97%   BMI 29.97 kg/m   Physical Exam Vitals and nursing note reviewed.  Constitutional:      General: She is not in acute distress.    Appearance: She is well-developed. She is ill-appearing. She is not toxic-appearing or diaphoretic.  HENT:     Head: Normocephalic and atraumatic.     Nose: Nose normal. No congestion or rhinorrhea.     Mouth/Throat:     Mouth: Mucous membranes are dry.  Eyes:     Comments: Abnormal and dilated pupils bilaterally.  Cardiovascular:     Rate and Rhythm: Regular rhythm. Tachycardia present.     Heart sounds: No murmur heard. Pulmonary:     Effort: Pulmonary effort is normal. No respiratory distress.     Breath sounds: Normal breath sounds. No wheezing, rhonchi or rales.  Chest:     Chest wall: No tenderness.  Abdominal:     Palpations: Abdomen is soft.     Tenderness: There is no abdominal tenderness. There is no right CVA tenderness, left CVA tenderness, guarding or rebound.  Musculoskeletal:        General: No swelling or tenderness.     Cervical back: Neck supple. No tenderness.  Skin:    General: Skin is warm and dry.     Capillary Refill: Capillary refill takes less than 2 seconds.     Findings: No erythema or rash.  Neurological:     Mental Status: She is alert.     Cranial Nerves: No facial asymmetry.     Comments: Patient able to move all extremities slightly.  Dry mucous membranes.  Pupils are irregular but dilated slightly.  Patient nonverbal for me but is following commands.  Per EMS nursing, this is slightly worse than baseline.  Psychiatric:        Mood and Affect: Mood normal.     ED Results / Procedures / Treatments   Labs (all labs ordered are listed, but only abnormal results are displayed) Labs Reviewed  COMPREHENSIVE METABOLIC PANEL - Abnormal; Notable for the following components:      Result Value   Sodium 156 (*)    Chloride 122 (*)    CO2 21 (*)    Glucose, Bld 103 (*)    Creatinine, Ser 1.09 (*)    Total Protein 8.2 (*)    All  other components within normal limits  CBC WITH DIFFERENTIAL/PLATELET - Abnormal; Notable for the following components:   RBC 5.53 (*)    RDW 16.4 (*)    All other components within normal limits  URINALYSIS, ROUTINE W REFLEX MICROSCOPIC - Abnormal; Notable for the following components:   Color, Urine AMBER (*)    APPearance TURBID (*)    pH 9.0 (*)    Bilirubin  Urine MODERATE (*)    Ketones, ur 20 (*)    Protein, ur >=300 (*)    Leukocytes,Ua MODERATE (*)    WBC, UA >50 (*)    Bacteria, UA MANY (*)    All other components within normal limits  RESP PANEL BY RT-PCR (FLU A&B, COVID) ARPGX2  CULTURE, BLOOD (ROUTINE X 2)  CULTURE, BLOOD (ROUTINE X 2)  URINE CULTURE  LACTIC ACID, PLASMA  PROTIME-INR  APTT  LACTIC ACID, PLASMA  I-STAT BETA HCG BLOOD, ED ([facility], [facility], [facility] ONLY)    EKG EKG Interpretation  Date/Time:  Friday April 19 2022 11:38:01 EDT Ventricular Rate:  103 PR Interval:  156 QRS Duration: 85 QT Interval:  338 QTC Calculation: 443 R Axis:   228 Text Interpretation: Sinus tachycardia Probable left atrial enlargement Anterolateral infarct, age indeterminate when compared to prior, similar appearance. No sTEMI Confirmed by Theda Belfast ([id number]) on 04/19/2022 1:22:06 PM  Radiology CT HEAD WO CONTRAST ( )  Result Date: 04/19/2022 CLINICAL DATA:  Altered mental status. EXAM: CT HEAD WITHOUT CONTRAST TECHNIQUE: Contiguous axial images were obtained from the base of the skull through the vertex without intravenous contrast. RADIATION DOSE REDUCTION: This exam was performed according to the departmental dose-optimization program which includes automated exposure control, adjustment of the mA and/or kV according to patient size and/or use of iterative reconstruction technique. COMPARISON:  MRI brain and CT head dated March 23, 2022. FINDINGS: Brain: No evidence of acute infarction, hemorrhage, hydrocephalus, extra-axial collection or mass lesion/mass effect. Similar mild chronic  microvascular ischemic changes and old lacunar infarcts in the left basal ganglia and bilateral thalami. Vascular: Calcified atherosclerosis at the skull base. No hyperdense vessel. Skull: Normal. Negative for fracture or focal lesion. Sinuses/Orbits: No acute finding. Other: None. IMPRESSION: 1. No acute intracranial abnormality. Electronically Signed   By: Obie Dredge M.D.   On: 04/19/2022 13:23   DG Chest Port 1 View  Result Date: 04/19/2022 CLINICAL DATA:  Questionable sepsis - evaluate for abnormality EXAM: PORTABLE CHEST 1 VIEW COMPARISON:  Radiograph 03/23/2022 FINDINGS: The patient is rotated to the left. Unchanged cardiomediastinal silhouette. There is no focal airspace consolidation. Low lung volumes. No pleural effusion. No pneumothorax. There is no acute osseous abnormality. Bilateral shoulder degenerative changes. IMPRESSION: No evidence of acute cardiopulmonary disease. Electronically Signed   By: Caprice Renshaw M.D.   On: 04/19/2022 12:01    Procedures Procedures    CRITICAL CARE Performed by: Canary Brim Hendrixx Severin Total critical care time: 40 minutes Critical care time was exclusive of separately billable procedures and treating other patients. Critical care was necessary to treat or prevent imminent or life-threatening deterioration. Critical care was time spent personally by me on the following activities: development of treatment plan with patient and/or surrogate as well as nursing, discussions with consultants, evaluation of patient's response to treatment, examination of patient, obtaining history from patient or surrogate, ordering and performing treatments and interventions, ordering and review of laboratory studies, ordering and review of radiographic studies, pulse oximetry and re-evaluation of patient's condition.   Medications Ordered in ED Medications  lactated ringers infusion (has no administration in time range)  lactated ringers bolus 1,000 mL (1,000 mLs  Intravenous New Bag/Given 04/19/22 1228)    And  lactated ringers bolus 1,000 mL (1,000 mLs Intravenous New Bag/Given 04/19/22 1322)    And  lactated ringers bolus 500 mL (has no administration in time range)  cefTRIAXone (ROCEPHIN) 2 g in sodium chloride 0.9 % 100 mL IVPB (0 g Intravenous  Stopped 04/19/22 1322)    ED Course/ Medical Decision Making/ A&P                           Medical Decision Making Amount and/or Complexity of Data Reviewed Labs: ordered. Radiology: ordered. ECG/medicine tests: ordered.  Risk Prescription drug management. Decision regarding hospitalization.    Cindy Brooks is a 51 y.o. female with a past medical history significant for stroke, hypertension, seizures, PEG dependence, and recent admission for UTI leading to encephalopathy worsening who presents with sepsis.  According to EMS report to nursing, patient from home with concern for recurrent UTI.  Patient has Foley catheter and has had a change in Foley appearance, smell, and today was found to be tachypneic, tachycardic, and warm to the touch.  Patient was given some fluids and heart rate slightly improved but she was still tachycardic.  Per EMS report, patient is minimally verbal due to previous stroke.  Patient is slightly slower to respond by the report.  On exam, patient is able to follow commands and wiggles both feet and squeezes both hands.  She is not answering questions.  Pupils are sluggish bilaterally.  Lungs were clear with no rhonchi.  Abdomen was nontender with a abdominal feeding tube in place.  Patient not specifically tender on my exam.  Vital signs on arrival show rectal temp of 101.2, she is tachycardic and tachypneic.  Given this concern for UTI with foul and dark urine, we will treat as a code sepsis with suspected urinary source.  Recent cultures were reviewed by me and pharmacy and they wanted to do Rocephin instead of broad-spectrum antibiotics at this time.  We will get chest x-ray and  other sepsis labs and due to her previous strokes with some concern for more sluggishness and altered mental status, will get a CT head.  Anticipate admission for sepsis from suspected UTI after work-up is completed.  2:10 PM Labs have continued to return.  Urinalysis does show leuks and many bacteria without epithelial cells.  Suspect UTI given the sepsis.  Patient is dehydrated with some hypernatremia and hyperchloremia.  Creatinine has increased but is only 1.09 at this time.  COVID and flu test negative.  Lactic acid normal.  No leukocytosis or anemia.  CT head shows no acute discrete abnormality.  Chest x-ray shows no pneumonia.  Patient admitted for sepsis from suspected UTI.         Final Clinical Impression(s) / ED Diagnoses Final diagnoses:  Sepsis, due to unspecified organism, unspecified whether acute organ dysfunction present [facility])  Lower urinary tract infectious disease     Clinical Impression: 1. Sepsis, due to unspecified organism, unspecified whether acute organ dysfunction present (HCC)   2. Lower urinary tract infectious disease     Disposition: Admit  This note was prepared with assistance of Dragon voice recognition software. Occasional wrong-word or sound-a-like substitutions may have occurred due to the inherent limitations of voice recognition software.      Fermin Yan, Canary Brim, MD 04/19/22 1436

## 2022-04-19 NOTE — Assessment & Plan Note (Addendum)
-   Suspected due to dehydration -Improved with fluids

## 2022-04-19 NOTE — Subjective & Objective (Addendum)
Cindy Brooks, a 51 y/o woman with multiple medical problems including dysphagia with PEG feeding tube, chronic foley catheter, HLD, HTN. At home [facility] staff noted she was hot to the touch. Urine in Foley noted to be very dark. Patient with UTI's in the past. She was transported to [facility] for further evaluation.

## 2022-04-19 NOTE — ED Notes (Addendum)
Dr. Debby Bud at bedside; verbal order given by Dr. Debby Bud to administer ordered PO meds per g-tube unless directed otherwise  Checked with family: She takes meds PO and also eats a chopped food diet. Needs assist to be sure she chews, swallows and does not fall asleep.

## 2022-04-19 NOTE — ED Triage Notes (Signed)
Pt bib ems from home; home health providers visiting for routine assessment; pt noted to be tachycardic; foley catheter in place, cloudy urine noted; pt hot to touch; hx uti; ; hx stroke, non verbal, answers yes, no question, delayed responses at baseline; increased lethargy, slower to respond; follows some commands; RR 30, capnography 35, 99.8 F temporal; BP 134/86, 120 sinus tach initially, 500 ns given, 108 after; 22 LH; 95% RA, CBG 106

## 2022-04-19 NOTE — H&P (Signed)
History and Physical    Cindy Brooks FUX:[id number] DOB: 1971/10/02 DOA: 04/19/2022  DOS: the patient was seen and examined on 04/19/2022  PCP: Pcp, No   Patient coming from: Home  I have personally briefly reviewed patient's old medical records in [facility]  Cindy Brooks, a 51 y/o woman with multiple medical problems including dysphagia with PEG feeding tube, chronic foley catheter, HLD, HTN. At home [facility] staff noted she was hot to the touch. Urine in Foley noted to be very dark. Patient with UTI's in the past. She was transported to [facility] for further evaluation.   ED Course: Tmax 101.2  HR 93 RR 20 BP 127/98. Patient non-conversant 2/2 CVA. Seemed to be in no distress. Lab remarkable for positive UA with many bacteria, >50 WBC/hpf, Na 156. Due to fever, tachycardia, abnl UA, increased RR code sepsis initiated. Patient received 2L LR and 2G Rocephin IV. TRH called to admit for continued care.   Review of Systems:  Review of Systems  Unable to perform ROS: Patient nonverbal    Past Medical History:  Diagnosis Date   Hypertension    Hypertensive urgency 01/20/2022   Seizure (HCC) 01/28/2022   Stroke [facility])    TIA (transient ischemic attack) 2019   Urinary tract infection 01/28/2022    Past Surgical History:  Procedure Laterality Date   ESOPHAGOGASTRODUODENOSCOPY N/A 01/28/2022   Procedure: ESOPHAGOGASTRODUODENOSCOPY (EGD);  Surgeon: Diamantina Monks, MD;  Location: [facility] ENDOSCOPY;  Service: General;  Laterality: N/A;   PEG PLACEMENT N/A 01/28/2022   Procedure: PERCUTANEOUS ENDOSCOPIC GASTROSTOMY (PEG) PLACEMENT;  Surgeon: Diamantina Monks, MD;  Location: [facility] ENDOSCOPY;  Service: General;  Laterality: N/A;    Soc Hx - single. Has 4 children. Lives with daughter, who is primary care giver.    reports that she has never smoked. She has never used smokeless tobacco. She reports current alcohol use of about 203.0 standard drinks of alcohol per week. She reports that she does not use  drugs.  Allergies  Allergen Reactions   Aspirin Hives and Rash   Codeine Hives and Rash   Penicillins Anaphylaxis, Swelling and Other (See Comments)   Strawberry Extract Anaphylaxis and Hives    Family History  Problem Relation Age of Onset   Diabetes Mellitus II Maternal Grandmother     Prior to Admission medications   Medication Sig Start Date End Date Taking? Authorizing Provider  acetaminophen (TYLENOL) 325 MG tablet Take 2 tablets (650 mg total) by mouth every 4 (four) hours as needed. Patient taking differently: Take 650 mg by mouth every 4 (four) hours as needed for moderate pain. 03/21/22 03/21/23  Almon Hercules, MD  amantadine (SYMMETREL) 100 MG capsule Take 1 capsule (100 mg total) by mouth 2 (two) times daily. 03/21/22 05/20/22  Almon Hercules, MD  amLODipine (NORVASC) 10 MG tablet Take 1 tablet (10 mg total) by mouth daily. 03/21/22   Almon Hercules, MD  atorvastatin (LIPITOR) 80 MG tablet Take 1 tablet (80 mg total) by mouth daily. 03/21/22   Almon Hercules, MD  clopidogrel (PLAVIX) 75 MG tablet Take 1 tablet (75 mg total) by mouth daily. 03/21/22   Almon Hercules, MD  feeding supplement (ENSURE ENLIVE / ENSURE PLUS) LIQD Take 237 mLs by mouth 3 (three) times daily between meals. 03/21/22 04/20/22  Almon Hercules, MD  folic acid (FOLVITE) 1 MG tablet Take 1 tablet (1 mg total) by mouth daily. 03/21/22   Almon Hercules, MD  gabapentin (  NEURONTIN) 100 MG capsule Take 1 capsule (100 mg total) by mouth 2 (two) times daily. 03/21/22   Almon Hercules, MD  glycopyrrolate (ROBINUL) 1 MG tablet Take 0.5 tablets (0.5 mg total) by mouth 2 (two) times daily. 03/21/22   Almon Hercules, MD  hydrALAZINE (APRESOLINE) 50 MG tablet Take 1 tablet (50 mg total) by mouth every 8 (eight) hours. 03/21/22 05/20/22  Almon Hercules, MD  ipratropium-albuterol (DUONEB) 0.5-2.5 (3) MG/3ML SOLN Take 3 mLs by nebulization every 6 (six) hours as needed. Patient taking differently: Take 3 mLs by nebulization every 6 (six) hours as needed  (shortness of breath, wheezing). 03/21/22   Almon Hercules, MD  Multiple Vitamin (MULTIVITAMIN WITH MINERALS) TABS tablet Take 1 tablet by mouth daily. 03/21/22   Almon Hercules, MD  Nystatin (GERHARDT'S BUTT CREAM) CREA Apply 1 application. topically daily as needed for irritation. 03/21/22   Almon Hercules, MD  polyethylene glycol powder (MIRALAX) 17 GM/SCOOP powder Take 17 g by mouth 2 (two) times daily as needed for moderate constipation. 03/21/22   Almon Hercules, MD  propranolol (INDERAL) 40 MG tablet Take 1 tablet (40 mg total) by mouth 3 (three) times daily. 03/21/22 05/20/22  Almon Hercules, MD  senna-docusate (SENOKOT-S) 8.6-50 MG tablet Take 1 tablet by mouth 2 (two) times daily between meals as needed for mild constipation. 03/21/22   Almon Hercules, MD  thiamine 100 MG tablet Take 1 tablet (100 mg total) by mouth daily. 03/21/22   Almon Hercules, MD    Physical Exam: Vitals:   04/19/22 1315 04/19/22 1400 04/19/22 1500 04/19/22 1531  BP:  (!) 127/98 (!) 127/98   Pulse: 96 93 93   Resp: 15 20 17    Temp:    (!) 97.3 F (36.3 C)  TempSrc:    Rectal  SpO2: 99% 98% 99%   Weight:      Height:        Physical Exam Constitutional:      General: She is not in acute distress.    Appearance: Normal appearance. She is normal weight.     Comments: Patient minimally responsive and non-conversant which is her baseline 2/2 CVA  HENT:     Head: Normocephalic and atraumatic.     Mouth/Throat:     Mouth: Mucous membranes are moist.  Eyes:     Extraocular Movements: Extraocular movements intact.     Conjunctiva/sclera: Conjunctivae normal.     Pupils: Pupils are equal, round, and reactive to light.  Neck:     Vascular: No carotid bruit.  Cardiovascular:     Rate and Rhythm: Regular rhythm. Tachycardia present.     Pulses: Normal pulses.     Heart sounds: Normal heart sounds.  Pulmonary:     Effort: Pulmonary effort is normal.     Breath sounds: Normal breath sounds.  Abdominal:     General: Bowel  sounds are normal.     Palpations: Abdomen is soft.     Tenderness: There is no abdominal tenderness. There is no guarding or rebound.  Musculoskeletal:        General: No swelling or deformity.     Right lower leg: No edema.     Left lower leg: No edema.  Lymphadenopathy:     Cervical: No cervical adenopathy.  Skin:    General: Skin is warm and dry.     Comments: Sacrum clear. Heels clear  Neurological:     Comments: CN- nl facial  symmetry, opens eyes intermittently. NO tongue fasiculations MS - does offer resistance to manipulation DTR- 1+ patellar, biceps tendons      Labs on Admission: I have personally reviewed following labs and imaging studies  CBC: Recent Labs  Lab 04/19/22 1210  WBC 8.6  NEUTROABS 5.7  HGB 14.6  HCT 45.0  MCV 81.4  PLT 284   Basic Metabolic Panel: Recent Labs  Lab 04/19/22 1210  NA 156*  K 3.8  CL 122*  CO2 21*  GLUCOSE 103*  BUN 20  CREATININE 1.09*  CALCIUM 9.7   GFR: Estimated Creatinine Clearance: 62.9 mL/min (A) (by C-G formula based on SCr of 1.09 mg/dL (H)). Liver Function Tests: Recent Labs  Lab 04/19/22 1210  AST 15  ALT 13  ALKPHOS 83  BILITOT 1.2  PROT 8.2*  ALBUMIN 4.2   No results for input(s): "LIPASE", "AMYLASE" in the last 168 hours. No results for input(s): "AMMONIA" in the last 168 hours. Coagulation Profile: Recent Labs  Lab 04/19/22 1210  INR 1.2   Cardiac Enzymes: No results for input(s): "CKTOTAL", "CKMB", "CKMBINDEX", "TROPONINI" in the last 168 hours. BNP (last 3 results) No results for input(s): "PROBNP" in the last 8760 hours. HbA1C: No results for input(s): "HGBA1C" in the last 72 hours. CBG: No results for input(s): "GLUCAP" in the last 168 hours. Lipid Profile: No results for input(s): "CHOL", "HDL", "LDLCALC", "TRIG", "CHOLHDL", "LDLDIRECT" in the last 72 hours. Thyroid Function Tests: No results for input(s): "TSH", "T4TOTAL", "FREET4", "T3FREE", "THYROIDAB" in the last 72  hours. Anemia Panel: No results for input(s): "VITAMINB12", "FOLATE", "FERRITIN", "TIBC", "IRON", "RETICCTPCT" in the last 72 hours. Urine analysis:    Component Value Date/Time   COLORURINE AMBER (A) 04/19/2022 1210   APPEARANCEUR TURBID (A) 04/19/2022 1210   LABSPEC 1.016 04/19/2022 1210   PHURINE 9.0 (H) 04/19/2022 1210   GLUCOSEU NEGATIVE 04/19/2022 1210   HGBUR NEGATIVE 04/19/2022 1210   BILIRUBINUR MODERATE (A) 04/19/2022 1210   KETONESUR 20 (A) 04/19/2022 1210   PROTEINUR >=300 (A) 04/19/2022 1210   NITRITE NEGATIVE 04/19/2022 1210   LEUKOCYTESUR MODERATE (A) 04/19/2022 1210    Radiological Exams on Admission: I have personally reviewed images CT HEAD WO CONTRAST ( )  Result Date: 04/19/2022 CLINICAL DATA:  Altered mental status. EXAM: CT HEAD WITHOUT CONTRAST TECHNIQUE: Contiguous axial images were obtained from the base of the skull through the vertex without intravenous contrast. RADIATION DOSE REDUCTION: This exam was performed according to the departmental dose-optimization program which includes automated exposure control, adjustment of the mA and/or kV according to patient size and/or use of iterative reconstruction technique. COMPARISON:  MRI brain and CT head dated March 23, 2022. FINDINGS: Brain: No evidence of acute infarction, hemorrhage, hydrocephalus, extra-axial collection or mass lesion/mass effect. Similar mild chronic microvascular ischemic changes and old lacunar infarcts in the left basal ganglia and bilateral thalami. Vascular: Calcified atherosclerosis at the skull base. No hyperdense vessel. Skull: Normal. Negative for fracture or focal lesion. Sinuses/Orbits: No acute finding. Other: None. IMPRESSION: 1. No acute intracranial abnormality. Electronically Signed   By: Obie Dredge M.D.   On: 04/19/2022 13:23   DG Chest Port 1 View  Result Date: 04/19/2022 CLINICAL DATA:  Questionable sepsis - evaluate for abnormality EXAM: PORTABLE CHEST 1 VIEW COMPARISON:   Radiograph 03/23/2022 FINDINGS: The patient is rotated to the left. Unchanged cardiomediastinal silhouette. There is no focal airspace consolidation. Low lung volumes. No pleural effusion. No pneumothorax. There is no acute osseous abnormality. Bilateral shoulder degenerative changes.  IMPRESSION: No evidence of acute cardiopulmonary disease. Electronically Signed   By: Caprice Renshaw M.D.   On: 04/19/2022 12:01    EKG: I have personally reviewed EKG: sinus tachycardia, old inferiorlateral injury, no acute changes  Assessment/Plan Principal Problem:   Sepsis secondary to UTI [facility]) Active Problems:   Hypernatremia   Dysphagia with PEG tube   HTN (hypertension)    Assessment and Plan: * Sepsis secondary to UTI [facility]) Patient noted at SNF to be warm, urine was dark and cloudy. On presentation Tmax 101.2, HR 93, RR 20. Code sepsis intitiated: fluid resuscitation administered and IV rocephin given. U/A positive.  Plan Med-surg admit  Continue Rocephin pending Ucx results  Hypernatremia Suspect simple dehydration 2/2 UTI  Plan Gentle hydration 1/2 NS  F/u Bmet in AM  Dysphagia with PEG tube Continue PEG feedings  HTN (hypertension) BP stable. Continue home regimen       DVT prophylaxis: Lovenox Code Status: Full Code Family Communication: spoke with Dennison Nancy - daughter and primary care giver  Disposition Plan: home with PT/OT/HH  Consults called: none  Admission status: Inpatient, Med-Surg   Illene Regulus, MD [facility] 04/19/2022, 3:52 PM

## 2022-04-20 DIAGNOSIS — E870 Hyperosmolality and hypernatremia: Secondary | ICD-10-CM

## 2022-04-20 DIAGNOSIS — N390 Urinary tract infection, site not specified: Secondary | ICD-10-CM | POA: Diagnosis present

## 2022-04-20 DIAGNOSIS — R1310 Dysphagia, unspecified: Secondary | ICD-10-CM

## 2022-04-20 DIAGNOSIS — T83511A Infection and inflammatory reaction due to indwelling urethral catheter, initial encounter: Secondary | ICD-10-CM

## 2022-04-20 LAB — BASIC METABOLIC PANEL
Anion gap: 8 (ref 5–15)
BUN: 14 mg/dL (ref 6–20)
CO2: 23 mmol/L (ref 22–32)
Calcium: 9.5 mg/dL (ref 8.9–10.3)
Chloride: 120 mmol/L — ABNORMAL HIGH (ref 98–111)
Creatinine, Ser: 0.72 mg/dL (ref 0.44–1.00)
GFR, Estimated: >60 mL/min (ref >60)
Glucose, Bld: 82 mg/dL (ref 70–99)
Potassium: 4.6 mmol/L (ref 3.5–5.1)
Sodium: 151 mmol/L — ABNORMAL HIGH (ref 135–145)

## 2022-04-20 MED ORDER — LACTATED RINGERS IV SOLN: INTRAVENOUS | Status: DC

## 2022-04-20 MED ORDER — NYSTATIN 100000 UNIT/ML MT SUSP
5.0000 mL | Freq: Four times a day (QID) | OROMUCOSAL | Status: DC
  Administered 2022-04-20 – 2022-04-21 (×3): 500000 [IU] via ORAL
  Filled 2022-04-20 (×4): qty 5

## 2022-04-20 MED ORDER — ORAL CARE MOUTH RINSE: 15.0000 mL | OROMUCOSAL | Status: DC | PRN

## 2022-04-20 MED ORDER — ORAL CARE MOUTH RINSE
15.0000 mL | OROMUCOSAL | Status: DC
  Administered 2022-04-20 – 2022-04-21 (×4): 15 mL via OROMUCOSAL

## 2022-04-20 MED ORDER — CHLORHEXIDINE GLUCONATE CLOTH 2 % EX PADS
6.0000 | MEDICATED_PAD | Freq: Every day | CUTANEOUS | Status: DC
  Administered 2022-04-20 – 2022-04-21 (×2): 6 via TOPICAL

## 2022-04-20 MED ORDER — VANCOMYCIN HCL 1750 MG/350ML IV SOLN
1750.0000 mg | Freq: Once | INTRAVENOUS | Status: AC
Start: 2022-04-20 — End: 2022-04-20
  Administered 2022-04-20: 1750 mg via INTRAVENOUS
  Filled 2022-04-20: qty 350

## 2022-04-20 NOTE — Progress Notes (Signed)
PHARMACY ANTIBIOTIC CONSULT NOTE   Cindy Brooks a 51 y.o. female admitted on 04/19/22 with urosepsis after [facility] staff noted she was febrile. UA with many bacteria. Pharmacy has been consulted for vancomycin dosing.   Patient's Ucx from this admission is pending however, Ucx from 03/23/22 frowing proteus (R-Nitrofurantoin) and E. Faecalis. The patient is noted to have risk factors for both bugs, with chronic foley catheter. Patient is also noted to have an anaphylactic reaction to penicillins listed in her chart, but she has tolerated ceftriaxone this admission. The patient is also nvbl d/t history of stroke, so unable to clarify allergy further.   7/8: Scr 0.72  Estimated Creatinine Clearance: 85.7 mL/min (by C-G formula based on SCr of 0.72 mg/dL).  Plan: Continue Ceftriaxone 1g IV Q24H  GIVE Vancomycin 1750 mg IV x1 (Wt used: 80 kg)  Per Dr. Frederick Peers will plan to discharge 7/9 after receiving a single dose of CTX with Nitrofurantoin 100 mg PO BID (to cover E faecalis) and cefdinir 300 mg BID to complete a 7d course of therapy for each bug  F/U Ucx results    Allergies:  Allergies  Allergen Reactions   Aspirin Hives and Rash   Codeine Hives and Rash   Penicillins Anaphylaxis, Swelling and Other (See Comments)   Strawberry Extract Anaphylaxis and Hives    Filed Weights   04/19/22 1129  Weight: 79.2 kg (174 lb 9.7 oz)       Latest Ref Rng & Units 04/19/2022   12:10 PM 03/26/2022    2:16 AM 03/24/2022    1:25 AM  CBC  WBC 4.0 - 10.5 K/uL 8.6  6.6  7.6   Hemoglobin 12.0 - 15.0 g/dL 59.5  63.8  75.6   Hematocrit 36.0 - 46.0 % 45.0  32.7  34.2   Platelets 150 - 400 K/uL 284  305  335     Antibiotics Given (last 72 hours)     Date/Time Action Medication Dose Rate   04/19/22 1228 New Bag/Given   cefTRIAXone (ROCEPHIN) 2 g in sodium chloride 0.9 % 100 mL IVPB 2 g 200 mL/hr   04/20/22 1147 New Bag/Given   cefTRIAXone (ROCEPHIN) 1 g in sodium chloride 0.9 % 100 mL IVPB 1 g 200 mL/hr        Jani Gravel, PharmD PGY-2 Infectious Diseases Resident  04/20/2022 1:43 PM

## 2022-04-20 NOTE — Assessment & Plan Note (Signed)
-   see sepsis 

## 2022-04-20 NOTE — Evaluation (Signed)
Physical Therapy Evaluation Patient Details Name: Cindy Brooks MRN: [id number] DOB: May 15, 1971 Today's Date: 04/20/2022  History of Present Illness  Pt admitted 04/19/22 with UTI. Recent h/o of stroke with right sided weakness. Family was caring for pt at home PTA. PMH signifiant for but not limited to: HTN, seizure, stroke, PEG tube.  Clinical Impression  Pt admitted with above diagnosis.  Pt currently with functional limitations due to the deficits listed below (see PT Problem List). Pt will benefit from skilled PT to increase their independence and safety with mobility to allow discharge to home with family to care for her (if that is indeed the plan).       Recommendations for follow up therapy are one component of a multi-disciplinary discharge planning process, led by the attending physician.  Recommendations may be updated based on patient status, additional functional criteria and insurance authorization.  Follow Up Recommendations Other (comment) (Not sure if she was receiving PT at home or not.  Would continue with any prehospitalization services)      Assistance Recommended at Discharge Frequent or constant Supervision/Assistance  Patient can return home with the following  A lot of help with walking and/or transfers;A lot of help with bathing/dressing/bathroom;Assistance with cooking/housework;Assist for transportation    Equipment Recommendations None recommended by PT  Recommendations for Other Services       Functional Status Assessment Patient has had a recent decline in their functional status and demonstrates the ability to make significant improvements in function in a reasonable and predictable amount of time.     Precautions / Restrictions Precautions Precautions: Fall Restrictions Weight Bearing Restrictions: No      Mobility  Bed Mobility Overal bed mobility: Needs Assistance Bed Mobility: Supine to Sit     Supine to sit: Mod assist     General bed  mobility comments: pt able to initiate moving LLE to EOB. Pt used Left chand to hold PTs hand to help pull her upper body up and to EOB.    Transfers Overall transfer level: Needs assistance   Transfers: Sit to/from Stand Sit to Stand: Mod assist           General transfer comment: Performed stand turn transfer with mod A.  Second person for safety.    Ambulation/Gait Ambulation/Gait assistance:  (Not attempted)                Stairs            Wheelchair Mobility    Modified Rankin (Stroke Patients Only)       Balance Overall balance assessment: Needs assistance Sitting-balance support: Single extremity supported Sitting balance-Leahy Scale: Poor Sitting balance - Comments: pt could sit for short periods of time with no support. Tened to lean posteriorly Postural control: Posterior lean Standing balance support: During functional activity Standing balance-Leahy Scale: Poor Standing balance comment: PT held to pt during transfer                             Pertinent Vitals/Pain Pain Assessment Pain Assessment: Faces Faces Pain Scale: No hurt    Home Living Family/patient expects to be discharged to:: Private residence                   Additional Comments:  (No family present on eval to confirm plan is to return to rpevious living situation)    Prior Function Prior Level of Function : Needs assist;Patient poor historian/Family not  available       Physical Assist : Mobility (physical) Mobility (physical): Bed mobility;Transfers;Gait;Stairs (Per review of chart form recent admit required significant assist with all mobilty)           Hand Dominance   Dominant Hand: Right    Extremity/Trunk Assessment   Upper Extremity Assessment Upper Extremity Assessment: Defer to OT evaluation    Lower Extremity Assessment Lower Extremity Assessment: RLE deficits/detail RLE Deficits / Details: increased tone when mobilizing and  attempting movement. Functional weakness and decreased coordination       Communication   Communication: Expressive difficulties (Pt followed most 1 step commands. Occassionally spoke one word answers to questions. Shook head yes several times to questions)  Cognition Arousal/Alertness: Awake/alert Behavior During Therapy: Flat affect Overall Cognitive Status: Difficult to assess                                          General Comments General comments (skin integrity, edema, etc.): no family present.    Exercises     Assessment/Plan    PT Assessment Patient needs continued PT services  PT Problem List Decreased strength;Decreased balance;Decreased mobility;Decreased coordination       PT Treatment Interventions Gait training;Therapeutic activities;Functional mobility training;Balance training;Neuromuscular re-education;Patient/family education    PT Goals (Current goals can be found in the Care Plan section)  Acute Rehab PT Goals Patient Stated Goal: pt did not state goal PT Goal Formulation: Patient unable to participate in goal setting Time For Goal Achievement: 05/04/22 Potential to Achieve Goals: Fair    Frequency Min 2X/week     Co-evaluation               AM-PAC PT "6 Clicks" Mobility  Outcome Measure Help needed turning from your back to your side while in a flat bed without using bedrails?: A Lot Help needed moving from lying on your back to sitting on the side of a flat bed without using bedrails?: A Lot Help needed moving to and from a bed to a chair (including a wheelchair)?: A Lot Help needed standing up from a chair using your arms (e.g., wheelchair or bedside chair)?: A Lot Help needed to walk in hospital room?: Total Help needed climbing 3-5 steps with a railing? : Total 6 Click Score: 10    End of Session Equipment Utilized During Treatment: Gait belt Activity Tolerance: Patient tolerated treatment well Patient left: in  chair;with call bell/phone within reach;with chair alarm set Nurse Communication: Mobility status PT Visit Diagnosis: Hemiplegia and hemiparesis Hemiplegia - Right/Left: Right Hemiplegia - dominant/non-dominant: Dominant Hemiplegia - caused by: Cerebral infarction    Time: 4076-8088 PT Time Calculation (min) (ACUTE ONLY): 16 min   Charges:   PT Evaluation $PT Eval Moderate Complexity: 1 Mod          Allen Basista, PT   Acute Rehabilitation Services  Office [phone] 04/20/2022   Donnella Sham 04/20/2022, 12:21 PM

## 2022-04-20 NOTE — Progress Notes (Signed)
Pt has very little appetite but is wanting liquids-Ensure, juice, water.

## 2022-04-20 NOTE — Progress Notes (Signed)
Progress Note    Cindy Brooks   [id number]  DOB: 01-21-1971  DOA: 04/19/2022     1 PCP: Pcp, No  Initial CC: fever  Hospital Course: Cindy Brooks is a 51 yo female with PMH CVA with residual dysphagia but no longer requiring PEG feeds, nonverbal, bedbound, chronic foley, HLD, HTN who presented with fever, tachycardia, and concern for recurrent UTI. Urinalysis was notable for moderate LE, negative nitrite, greater than 50 WBC. Foley catheter was exchanged on admission and she was admitted for antibiotics.  Interval History:  Sitting in recliner bedside when seen today.  She is nonverbal but could follow commands and move extremities a little bit.  Assessment and Plan: * Sepsis secondary to UTI (HCC) - Febrile, tachycardia, tachypnea.  Source considered urinary - Foley catheter appears to have been exchanged on admission - Continue Rocephin and add vancomycin today - Cultures growing Proteus and E faecalis.  Patient has anaphylactic reaction to penicillins but tolerating cephalosporins currently - Sensitivities reviewed and discussed with pharmacy as well given allergies and organisms.  Tentative plan is to de-escalate down to cefdinir and nitrofurantoin at discharge to complete courses  Hypernatremia - Suspected due to dehydration - Continue fluids - BMP in a.m.  Dysphagia with PEG tube - Patient tolerates dysphagia diet  Catheter-associated urinary tract infection (HCC) - see sepsis  HTN (hypertension) BP stable. Continue home regimen    Old records reviewed in assessment of this patient  Antimicrobials: Rocephin 04/19/2022 >> current Vancomycin 04/20/2022 >> current  DVT prophylaxis:  enoxaparin (LOVENOX) injection 40 mg Start: 04/19/22 1800   Code Status:   Code Status: Full Code  Mobility Assessment (last 72 hours)     Mobility Assessment     Row Name 04/20/22 1203 04/20/22 0000         Does patient have an order for bedrest or is patient medically  unstable -- Yes- Bedfast (Level 1) - Complete      What is the highest level of mobility based on the progressive mobility assessment? Level 3 (Stands with assist) - Balance while standing  and cannot march in place --               Disposition Plan:  SNF Sunday Status is: Inpt  Objective: Blood pressure (!) 136/96, pulse 78, temperature 98.1 F (36.7 C), temperature source Oral, resp. rate 15, height 5\' 4"  (1.626 m), weight 79.2 kg, SpO2 98 %.  Examination:  Physical Exam Constitutional:      General: She is not in acute distress. HENT:     Head: Normocephalic and atraumatic.     Mouth/Throat:     Mouth: Mucous membranes are moist.  Eyes:     Extraocular Movements: Extraocular movements intact.  Cardiovascular:     Rate and Rhythm: Normal rate and regular rhythm.  Pulmonary:     Effort: Pulmonary effort is normal.     Breath sounds: Normal breath sounds.  Abdominal:     General: Bowel sounds are normal. There is no distension.     Palpations: Abdomen is soft.     Tenderness: There is no abdominal tenderness.  Musculoskeletal:        General: No swelling.     Cervical back: Normal range of motion and neck supple.  Skin:    General: Skin is warm and dry.  Neurological:     Mental Status: She is alert. Mental status is at baseline.      Consultants:    Procedures:  Data Reviewed: Results for orders placed or performed during the hospital encounter of 04/19/22 (from the past 24 hour(s))  Basic metabolic panel     Status: Abnormal   Collection Time: 04/20/22 12:35 AM  Result Value Ref Range   Sodium 151 (H) 135 - 145 mmol/L   Potassium 4.6 3.5 - 5.1 mmol/L   Chloride 120 (H) 98 - 111 mmol/L   CO2 23 22 - 32 mmol/L   Glucose, Bld 82 70 - 99 mg/dL   BUN 14 6 - 20 mg/dL   Creatinine, Ser 5.39 0.44 - 1.00 mg/dL   Calcium 9.5 8.9 - 76.7 mg/dL   GFR, Estimated >34 >19 mL/min   Anion gap 8 5 - 15    I have Reviewed nursing notes, Vitals, and Lab results since  pt's last encounter. Pertinent lab results : see above I have ordered test including BMP, CBC, Mg I have reviewed the last note from staff over past 24 hours I have discussed pt's care plan and test results with nursing staff, case manager   LOS: 1 day   Lewie Chamber, MD [facility] 04/20/2022, 4:06 PM

## 2022-04-20 NOTE — Hospital Course (Signed)
Cindy Brooks is a 51 yo female with PMH CVA with residual dysphagia but no longer requiring PEG feeds, nonverbal, bedbound, chronic foley, HLD, HTN who presented with fever, tachycardia, and concern for recurrent UTI. Urinalysis was notable for moderate LE, negative nitrite, greater than 50 WBC. Foley catheter was exchanged on admission and she was admitted for antibiotics. Urine culture grew Proteus and antibiotics were de-escalated from Rocephin down to cefdinir to complete course at discharge.

## 2022-04-20 NOTE — Progress Notes (Signed)
Spoke with patient's daughter [name] who confirmed patient does not take any feeds through the PEG tube. PEG tube gets flushed every 6 hours at home, she take pills whole with water at home. She informed me she takes a soft diet at home.

## 2022-04-20 NOTE — Progress Notes (Addendum)
   04/19/22 2329  Vitals  Temp 97.7 F (36.5 C)  Temp Source Oral  BP 115/75  MAP (mmHg) 88  BP Location Right Arm  BP Method Automatic  Patient Position (if appropriate) Lying  Pulse Rate 81  Pulse Rate Source Monitor  Resp 18  MEWS COLOR  MEWS Score Color Green  Oxygen Therapy  SpO2 98 %  O2 Device Room Air  MEWS Score  MEWS Temp 0  MEWS Systolic 0  MEWS Pulse 0  MEWS RR 0  MEWS LOC 0  MEWS Score 0   Patient arrived to unit. Non verbal , PEG tube dressing changed due to old drainage. Cloudy urine noted from catheter, foley care done.  Foam dressing placed on the heel and sacrum for protection

## 2022-04-21 LAB — BASIC METABOLIC PANEL
Anion gap: 12 (ref 5–15)
Anion gap: 11 (ref 5–15)
BUN: 14 mg/dL (ref 6–20)
BUN: 6 mg/dL (ref 6–20)
CO2: 22 mmol/L (ref 22–32)
CO2: 25 mmol/L (ref 22–32)
Calcium: 9 mg/dL (ref 8.9–10.3)
Calcium: 8.9 mg/dL (ref 8.9–10.3)
Chloride: 114 mmol/L — ABNORMAL HIGH (ref 98–111)
Chloride: 113 mmol/L — ABNORMAL HIGH (ref 98–111)
Creatinine, Ser: 0.77 mg/dL (ref 0.44–1.00)
Creatinine, Ser: 0.52 mg/dL (ref 0.44–1.00)
GFR, Estimated: >60 mL/min (ref >60)
GFR, Estimated: >60 mL/min (ref >60)
Glucose, Bld: 142 mg/dL — ABNORMAL HIGH (ref 70–99)
Glucose, Bld: 96 mg/dL (ref 70–99)
Potassium: 5.9 mmol/L — ABNORMAL HIGH (ref 3.5–5.1)
Potassium: 3.2 mmol/L — ABNORMAL LOW (ref 3.5–5.1)
Sodium: 148 mmol/L — ABNORMAL HIGH (ref 135–145)
Sodium: 149 mmol/L — ABNORMAL HIGH (ref 135–145)

## 2022-04-21 LAB — URINE CULTURE: Culture: >=100000 — AB

## 2022-04-21 LAB — MAGNESIUM: Magnesium: 2.1 mg/dL (ref 1.7–2.4)

## 2022-04-21 MED ORDER — CEFDINIR 300 MG PO CAPS
600.0000 mg | ORAL_CAPSULE | Freq: Every day | ORAL | 0 refills | Status: AC
Start: 2022-04-21 — End: 2022-04-28

## 2022-04-21 MED ORDER — SODIUM ZIRCONIUM CYCLOSILICATE 10 G PO PACK
10.0000 g | PACK | Freq: Once | ORAL | Status: AC
Start: 2022-04-21 — End: 2022-04-21
  Administered 2022-04-21: 10 g via ORAL
  Filled 2022-04-21: qty 1

## 2022-04-21 MED ORDER — SODIUM CHLORIDE 0.9 % IV BOLUS
1000.0000 mL | Freq: Once | INTRAVENOUS | Status: AC
  Administered 2022-04-21: 1000 mL via INTRAVENOUS

## 2022-04-21 MED ORDER — FUROSEMIDE 10 MG/ML IJ SOLN
20.0000 mg | Freq: Once | INTRAMUSCULAR | Status: AC
  Administered 2022-04-21: 20 mg via INTRAVENOUS
  Filled 2022-04-21: qty 2

## 2022-04-21 NOTE — TOC Transition Note (Signed)
Transition of Care [facility] - CM/SW Discharge Note   Patient Details  Name: Cashlynn Yearwood MRN: [id number] Date of Birth: 1970/11/21  Transition of [facility] CM/SW Contact:  Bess Kinds, RN Phone Number: [phone] 04/21/2022, 1:35 PM   Clinical Narrative:     Spoke with patient's daughter, [name], at [phone] to discuss transition home today. [facility] transport requested. [facility] arranged and medical transport paperwork completed. Patient active with [facility] for PT - HH resumption orders in place. [facility] aware of transition today. No further TOC needs identified at this time.   Final next level of care: Home w Home Health Services Barriers to Discharge: No Barriers Identified   Patient Goals and CMS Choice     Choice offered to / list presented to : NA  Discharge Placement                       Discharge Plan and Services                DME Arranged: N/A DME Agency: NA       HH Arranged: PT HH Agency: [facility] Date HH Agency Contacted: 04/21/22 Time HH Agency Contacted: [phone]    Social Determinants of Health (SDOH) Interventions     Readmission Risk Interventions    03/13/2022    9:03 AM  Readmission Risk Prevention Plan  Transportation Screening Complete  PCP or Specialist Appt within 5-7 Days Complete  Home Care Screening Complete  Medication Review (RN CM) Complete

## 2022-04-21 NOTE — Discharge Summary (Signed)
Physician Discharge Summary   Cindy Brooks XTK:[id number] DOB: Oct 02, 1971 DOA: 04/19/2022  PCP: Oneita Hurt, No  Admit date: 04/19/2022 Discharge date:  04/21/2022  Admitted From: Home Disposition:  Home Discharging physician: Lewie Chamber, MD  Recommendations for Outpatient Follow-up:  Change foley cath monthly  Home Health:  Equipment/Devices:   Discharge Condition: stable CODE STATUS: Full Diet recommendation:  Diet Orders (From admission, onward)     Start     Ordered   04/20/22 0921  DIET DYS 3 Room service appropriate? Yes; Fluid consistency: Thin  Diet effective now       Question Answer Comment  Room service appropriate? Yes   Fluid consistency: Thin      04/20/22 0920            Hospital Course: Cindy Brooks is a 51 yo female with PMH CVA with residual dysphagia but no longer requiring PEG feeds, nonverbal, bedbound, chronic foley, HLD, HTN who presented with fever, tachycardia, and concern for recurrent UTI. Urinalysis was notable for moderate LE, negative nitrite, greater than 50 WBC. Foley catheter was exchanged on admission and she was admitted for antibiotics. Urine culture grew Proteus and antibiotics were de-escalated from Rocephin down to cefdinir to complete course at discharge.  Assessment and Plan: * Sepsis secondary to UTI (HCC) - Febrile, tachycardia, tachypnea.  Source considered urinary - Foley catheter appears to have been exchanged on admission -Urine culture reviewed.  Growing Proteus.  Sensitivities reviewed - Treated with Rocephin during hospitalization, de-escalate to cefdinir to complete course at discharge  Hypernatremia - Suspected due to dehydration -Improved with fluids  Dysphagia with PEG tube - Patient tolerates dysphagia diet  Catheter-associated urinary tract infection (HCC) - see sepsis  HTN (hypertension) BP stable. Continue home regimen   The patient's chronic medical conditions were treated accordingly per the patient's  home medication regimen except as noted.  On day of discharge, patient was felt deemed stable for discharge. Patient/family member advised to call PCP or come back to ER if needed.   Principal Diagnosis: Sepsis secondary to UTI [facility])  Discharge Diagnoses: Active Hospital Problems   Diagnosis Date Noted   Sepsis secondary to UTI (HCC) 04/19/2022    Priority: High   Hypernatremia 02/07/2022    Priority: Medium    Dysphagia with PEG tube 03/20/2022    Priority: 4.   Catheter-associated urinary tract infection (HCC) 04/20/2022   HTN (hypertension)     Resolved Hospital Problems  No resolved problems to display.     Discharge Instructions     Increase activity slowly   Complete by: As directed       Allergies as of 04/21/2022       Reactions   Aspirin Hives, Rash   Codeine Hives, Rash   Penicillins Anaphylaxis, Swelling, Other (See Comments)   Strawberry Extract Anaphylaxis, Hives        Medication List     STOP taking these medications    feeding supplement Liqd       TAKE these medications    acetaminophen 325 MG tablet Commonly known as: Tylenol Take 2 tablets (650 mg total) by mouth every 4 (four) hours as needed.   amantadine 100 MG capsule Commonly known as: SYMMETREL Take 1 capsule (100 mg total) by mouth 2 (two) times daily.   amLODipine 10 MG tablet Commonly known as: NORVASC Take 1 tablet (10 mg total) by mouth daily.   atorvastatin 80 MG tablet Commonly known as: LIPITOR Take 1 tablet (80 mg total) by  mouth daily.   cefdinir 300 MG capsule Commonly known as: OMNICEF Take 2 capsules (600 mg total) by mouth daily for 7 days.   clopidogrel 75 MG tablet Commonly known as: PLAVIX Take 1 tablet (75 mg total) by mouth daily.   folic acid 1 MG tablet Commonly known as: FOLVITE Take 1 tablet (1 mg total) by mouth daily.   gabapentin 100 MG capsule Commonly known as: NEURONTIN Take 1 capsule (100 mg total) by mouth 2 (two) times daily.    Gerhardt's butt cream Crea Apply 1 application. topically daily as needed for irritation.   glycopyrrolate 1 MG tablet Commonly known as: ROBINUL Take 0.5 tablets (0.5 mg total) by mouth 2 (two) times daily.   hydrALAZINE 50 MG tablet Commonly known as: APRESOLINE Take 1 tablet (50 mg total) by mouth every 8 (eight) hours.   ipratropium-albuterol 0.5-2.5 (3) MG/3ML Soln Commonly known as: DUONEB Take 3 mLs by nebulization every 6 (six) hours as needed. What changed: reasons to take this   multivitamin with minerals Tabs tablet Take 1 tablet by mouth daily.   polyethylene glycol powder 17 GM/SCOOP powder Commonly known as: MiraLax Take 17 g by mouth 2 (two) times daily as needed for moderate constipation.   propranolol 40 MG tablet Commonly known as: INDERAL Take 1 tablet (40 mg total) by mouth 3 (three) times daily.   senna-docusate 8.6-50 MG tablet Commonly known as: Senokot-S Take 1 tablet by mouth 2 (two) times daily between meals as needed for mild constipation.   thiamine 100 MG tablet Take 1 tablet (100 mg total) by mouth daily.        Allergies  Allergen Reactions   Aspirin Hives and Rash   Codeine Hives and Rash   Penicillins Anaphylaxis, Swelling and Other (See Comments)   Strawberry Extract Anaphylaxis and Hives    Consultations:   Procedures:   Discharge Exam: BP 114/77 (BP Location: Left Wrist)   Pulse 78   Temp 98.8 F (37.1 C) (Oral)   Resp 13   Ht 5\' 4"  (1.626 m)   Wt 79.2 kg   LMP  (LMP Unknown)   SpO2 100%   BMI 29.97 kg/m  Physical Exam Constitutional:      General: She is not in acute distress. HENT:     Head: Normocephalic and atraumatic.     Mouth/Throat:     Mouth: Mucous membranes are moist.  Eyes:     Extraocular Movements: Extraocular movements intact.  Cardiovascular:     Rate and Rhythm: Normal rate and regular rhythm.  Pulmonary:     Effort: Pulmonary effort is normal.     Breath sounds: Normal breath sounds.   Abdominal:     General: Bowel sounds are normal. There is no distension.     Palpations: Abdomen is soft.     Tenderness: There is no abdominal tenderness.  Musculoskeletal:        General: No swelling.     Cervical back: Normal range of motion and neck supple.  Skin:    General: Skin is warm and dry.  Neurological:     Mental Status: She is alert. Mental status is at baseline.      The results of significant diagnostics from this hospitalization (including imaging, microbiology, ancillary and laboratory) are listed below for reference.   Microbiology: Recent Results (from the past 240 hour(s))  Urine Culture     Status: Abnormal   Collection Time: 04/19/22 11:48 AM   Specimen: In/Out Cath Urine  Result Value Ref Range Status   Specimen Description IN/OUT CATH URINE  Final   Special Requests   Final    NONE Performed at [facility], [address] [place], [place] [address]    Culture >=100,000 COLONIES/mL PROTEUS MIRABILIS (A)  Final   Report Status 04/21/2022 FINAL  Final   Organism ID, Bacteria PROTEUS MIRABILIS (A)  Final      Susceptibility   Proteus mirabilis - MIC*    AMPICILLIN <=2 SENSITIVE Sensitive     CEFAZOLIN <=4 SENSITIVE Sensitive     CEFEPIME <=0.12 SENSITIVE Sensitive     CEFTRIAXONE <=0.25 SENSITIVE Sensitive     CIPROFLOXACIN <=0.25 SENSITIVE Sensitive     GENTAMICIN <=1 SENSITIVE Sensitive     IMIPENEM 4 SENSITIVE Sensitive     NITROFURANTOIN 128 RESISTANT Resistant     TRIMETH/SULFA <=20 SENSITIVE Sensitive     AMPICILLIN/SULBACTAM <=2 SENSITIVE Sensitive     PIP/TAZO <=4 SENSITIVE Sensitive     * >=100,000 COLONIES/mL PROTEUS MIRABILIS  Blood Culture (routine x 2)     Status: None (Preliminary result)   Collection Time: 04/19/22 12:10 PM   Specimen: BLOOD RIGHT WRIST  Result Value Ref Range Status   Specimen Description BLOOD RIGHT WRIST  Final   Special Requests   Final    BOTTLES DRAWN AEROBIC AND ANAEROBIC Blood Culture results may  not be optimal due to an inadequate volume of blood received in culture bottles   Culture   Final    NO GROWTH 2 DAYS Performed at [facility], [address] [place], [place] [address]    Report Status PENDING  Incomplete  Resp Panel by RT-PCR (Flu A&B, Covid) Anterior Nasal Swab     Status: None   Collection Time: 04/19/22 12:14 PM   Specimen: Anterior Nasal Swab  Result Value Ref Range Status   SARS Coronavirus 2 by RT PCR NEGATIVE NEGATIVE Final    Comment: (NOTE) SARS-CoV-2 target nucleic acids are NOT DETECTED.  The SARS-CoV-2 RNA is generally detectable in upper respiratory specimens during the acute phase of infection. The lowest concentration of SARS-CoV-2 viral copies this assay can detect is 138 copies/mL. A negative result does not preclude SARS-Cov-2 infection and should not be used as the sole basis for treatment or other patient management decisions. A negative result may occur with  improper specimen collection/handling, submission of specimen other than nasopharyngeal swab, presence of viral mutation(s) within the areas targeted by this assay, and inadequate number of viral copies(<138 copies/mL). A negative result must be combined with clinical observations, patient history, and epidemiological information. The expected result is Negative.  Fact Sheet for Patients:  BloggerCourse.comhttps://www.fda.gov/media/152166/download  Fact Sheet for Healthcare Providers:  SeriousBroker.ithttps://www.fda.gov/media/152162/download  This test is no t yet approved or cleared by the [place] FDA and  has been authorized for detection and/or diagnosis of SARS-CoV-2 by FDA under an Emergency Use Authorization (EUA). This EUA will remain  in effect (meaning this test can be used) for the duration of the COVID-19 declaration under Section 564(b)(1) of the Act, 21 U.S.C.section 360bbb-3(b)(1), unless the authorization is terminated  or revoked sooner.       Influenza A by PCR NEGATIVE NEGATIVE  Final   Influenza B by PCR NEGATIVE NEGATIVE Final    Comment: (NOTE) The Xpert Xpress SARS-CoV-2/FLU/RSV plus assay is intended as an aid in the diagnosis of influenza from Nasopharyngeal swab specimens and should not be used as a sole basis for treatment. Nasal  washings and aspirates are unacceptable for Xpert Xpress SARS-CoV-2/FLU/RSV testing.  Fact Sheet for Patients: BloggerCourse.com  Fact Sheet for Healthcare Providers: SeriousBroker.it  This test is not yet approved or cleared by the [place] FDA and has been authorized for detection and/or diagnosis of SARS-CoV-2 by FDA under an Emergency Use Authorization (EUA). This EUA will remain in effect (meaning this test can be used) for the duration of the COVID-19 declaration under Section 564(b)(1) of the Act, 21 U.S.C. section 360bbb-3(b)(1), unless the authorization is terminated or revoked.  Performed at [facility], [address], [place], [place] [address]      Labs: BNP (last 3 results) No results for input(s): "BNP" in the last 8760 hours. Basic Metabolic Panel: Recent Labs  Lab 04/19/22 1210 04/20/22 0035 04/21/22 0021 04/21/22 1149  NA 156* 151* 148* 149*  K 3.8 4.6 5.9* 3.2*  CL 122* 120* 114* 113*  CO2 21* 23 22 25   GLUCOSE 103* 82 142* 96  BUN 20 14 14 6   CREATININE 1.09* 0.72 0.77 0.52  CALCIUM 9.7 9.5 9.0 8.9  MG  --   --  2.1  --    Liver Function Tests: Recent Labs  Lab 04/19/22 1210  AST 15  ALT 13  ALKPHOS 83  BILITOT 1.2  PROT 8.2*  ALBUMIN 4.2   No results for input(s): "LIPASE", "AMYLASE" in the last 168 hours. No results for input(s): "AMMONIA" in the last 168 hours. CBC: Recent Labs  Lab 04/19/22 1210  WBC 8.6  NEUTROABS 5.7  HGB 14.6  HCT 45.0  MCV 81.4  PLT 284   Cardiac Enzymes: No results for input(s): "CKTOTAL", "CKMB", "CKMBINDEX", "TROPONINI" in the last 168 hours. BNP: Invalid input(s):  "POCBNP" CBG: No results for input(s): "GLUCAP" in the last 168 hours. D-Dimer No results for input(s): "DDIMER" in the last 72 hours. Hgb A1c No results for input(s): "HGBA1C" in the last 72 hours. Lipid Profile No results for input(s): "CHOL", "HDL", "LDLCALC", "TRIG", "CHOLHDL", "LDLDIRECT" in the last 72 hours. Thyroid function studies No results for input(s): "TSH", "T4TOTAL", "T3FREE", "THYROIDAB" in the last 72 hours.  Invalid input(s): "FREET3" Anemia work up No results for input(s): "VITAMINB12", "FOLATE", "FERRITIN", "TIBC", "IRON", "RETICCTPCT" in the last 72 hours. Urinalysis    Component Value Date/Time   COLORURINE AMBER (A) 04/19/2022 1210   APPEARANCEUR TURBID (A) 04/19/2022 1210   LABSPEC 1.016 04/19/2022 1210   PHURINE 9.0 (H) 04/19/2022 1210   GLUCOSEU NEGATIVE 04/19/2022 1210   HGBUR NEGATIVE 04/19/2022 1210   BILIRUBINUR MODERATE (A) 04/19/2022 1210   KETONESUR 20 (A) 04/19/2022 1210   PROTEINUR >=300 (A) 04/19/2022 1210   NITRITE NEGATIVE 04/19/2022 1210   LEUKOCYTESUR MODERATE (A) 04/19/2022 1210   Sepsis Labs Recent Labs  Lab 04/19/22 1210  WBC 8.6   Microbiology Recent Results (from the past 240 hour(s))  Urine Culture     Status: Abnormal   Collection Time: 04/19/22 11:48 AM   Specimen: In/Out Cath Urine  Result Value Ref Range Status   Specimen Description IN/OUT CATH URINE  Final   Special Requests   Final    NONE Performed at [facility], [address], [place], [address] [place]    Culture >=100,000 COLONIES/mL PROTEUS MIRABILIS (A)  Final   Report Status 04/21/2022 FINAL  Final   Organism ID, Bacteria PROTEUS MIRABILIS (A)  Final      Susceptibility   Proteus mirabilis - MIC*    AMPICILLIN <=2 SENSITIVE Sensitive  CEFAZOLIN <=4 SENSITIVE Sensitive     CEFEPIME <=0.12 SENSITIVE Sensitive     CEFTRIAXONE <=0.25 SENSITIVE Sensitive     CIPROFLOXACIN <=0.25 SENSITIVE Sensitive     GENTAMICIN <=1 SENSITIVE Sensitive      IMIPENEM 4 SENSITIVE Sensitive     NITROFURANTOIN 128 RESISTANT Resistant     TRIMETH/SULFA <=20 SENSITIVE Sensitive     AMPICILLIN/SULBACTAM <=2 SENSITIVE Sensitive     PIP/TAZO <=4 SENSITIVE Sensitive     * >=100,000 COLONIES/mL PROTEUS MIRABILIS  Blood Culture (routine x 2)     Status: None (Preliminary result)   Collection Time: 04/19/22 12:10 PM   Specimen: BLOOD RIGHT WRIST  Result Value Ref Range Status   Specimen Description BLOOD RIGHT WRIST  Final   Special Requests   Final    BOTTLES DRAWN AEROBIC AND ANAEROBIC Blood Culture results may not be optimal due to an inadequate volume of blood received in culture bottles   Culture   Final    NO GROWTH 2 DAYS Performed at [facility], [address], [place], [place] [address]    Report Status PENDING  Incomplete  Resp Panel by RT-PCR (Flu A&B, Covid) Anterior Nasal Swab     Status: None   Collection Time: 04/19/22 12:14 PM   Specimen: Anterior Nasal Swab  Result Value Ref Range Status   SARS Coronavirus 2 by RT PCR NEGATIVE NEGATIVE Final    Comment: (NOTE) SARS-CoV-2 target nucleic acids are NOT DETECTED.  The SARS-CoV-2 RNA is generally detectable in upper respiratory specimens during the acute phase of infection. The lowest concentration of SARS-CoV-2 viral copies this assay can detect is 138 copies/mL. A negative result does not preclude SARS-Cov-2 infection and should not be used as the sole basis for treatment or other patient management decisions. A negative result may occur with  improper specimen collection/handling, submission of specimen other than nasopharyngeal swab, presence of viral mutation(s) within the areas targeted by this assay, and inadequate number of viral copies(<138 copies/mL). A negative result must be combined with clinical observations, patient history, and epidemiological information. The expected result is Negative.  Fact Sheet for Patients:   BloggerCourse.com  Fact Sheet for Healthcare Providers:  SeriousBroker.it  This test is no t yet approved or cleared by the [place] FDA and  has been authorized for detection and/or diagnosis of SARS-CoV-2 by FDA under an Emergency Use Authorization (EUA). This EUA will remain  in effect (meaning this test can be used) for the duration of the COVID-19 declaration under Section 564(b)(1) of the Act, 21 U.S.C.section 360bbb-3(b)(1), unless the authorization is terminated  or revoked sooner.       Influenza A by PCR NEGATIVE NEGATIVE Final   Influenza B by PCR NEGATIVE NEGATIVE Final    Comment: (NOTE) The Xpert Xpress SARS-CoV-2/FLU/RSV plus assay is intended as an aid in the diagnosis of influenza from Nasopharyngeal swab specimens and should not be used as a sole basis for treatment. Nasal washings and aspirates are unacceptable for Xpert Xpress SARS-CoV-2/FLU/RSV testing.  Fact Sheet for Patients: BloggerCourse.com  Fact Sheet for Healthcare Providers: SeriousBroker.it  This test is not yet approved or cleared by the [place] FDA and has been authorized for detection and/or diagnosis of SARS-CoV-2 by FDA under an Emergency Use Authorization (EUA). This EUA will remain in effect (meaning this test can be used) for the duration of the COVID-19 declaration under Section 564(b)(1) of the Act, 21 U.S.C. section 360bbb-3(b)(1), unless the authorization is terminated or  revoked.  Performed at [facility], [address], [place], [place] [address]     Procedures/Studies: CT HEAD WO CONTRAST ( )  Result Date: 04/19/2022 CLINICAL DATA:  Altered mental status. EXAM: CT HEAD WITHOUT CONTRAST TECHNIQUE: Contiguous axial images were obtained from the base of the skull through the vertex without intravenous contrast. RADIATION DOSE REDUCTION: This exam was performed  according to the departmental dose-optimization program which includes automated exposure control, adjustment of the mA and/or kV according to patient size and/or use of iterative reconstruction technique. COMPARISON:  MRI brain and CT head dated March 23, 2022. FINDINGS: Brain: No evidence of acute infarction, hemorrhage, hydrocephalus, extra-axial collection or mass lesion/mass effect. Similar mild chronic microvascular ischemic changes and old lacunar infarcts in the left basal ganglia and bilateral thalami. Vascular: Calcified atherosclerosis at the skull base. No hyperdense vessel. Skull: Normal. Negative for fracture or focal lesion. Sinuses/Orbits: No acute finding. Other: None. IMPRESSION: 1. No acute intracranial abnormality. Electronically Signed   By: Obie Dredge M.D.   On: 04/19/2022 13:23   DG Chest Port 1 View  Result Date: 04/19/2022 CLINICAL DATA:  Questionable sepsis - evaluate for abnormality EXAM: PORTABLE CHEST 1 VIEW COMPARISON:  Radiograph 03/23/2022 FINDINGS: The patient is rotated to the left. Unchanged cardiomediastinal silhouette. There is no focal airspace consolidation. Low lung volumes. No pleural effusion. No pneumothorax. There is no acute osseous abnormality. Bilateral shoulder degenerative changes. IMPRESSION: No evidence of acute cardiopulmonary disease. Electronically Signed   By: Caprice Renshaw M.D.   On: 04/19/2022 12:01   EEG adult  Result Date: 03/24/2022 Jefferson Fuel, MD     03/24/2022  8:03 PM Routine EEG Report Oswin Johal is a 51 y.o. female with a history of speech impairment and encephalopathy who is undergoing an EEG to evaluate for seizures. Report: This EEG was acquired with electrodes placed according to the International 10-20 electrode system (including Fp1, Fp2, F3, F4, C3, C4, P3, P4, O1, O2, T3, T4, T5, T6, A1, A2, Fz, Cz, Pz). The following electrodes were missing or displaced: none. The occipital dominant rhythm was 7 Hz. This activity is reactive  to stimulation. Drowsiness was manifested by background fragmentation; deeper stages of sleep were identified by K complexes and sleep spindles. There was no focal slowing. There were no interictal epileptiform discharges. There were no electrographic seizures identified. Photic stimulation and hyperventilation were not performed. Impression and clinical correlation: This EEG was obtained while awake and asleep and is abnormal due to mild diffuse slowing indicative of global cerebral dysfunction. Epileptiform abnormalities were not seen during this recording. Bing Neighbors, MD [facility] [phone] If 7pm- 7am, please page neurology on call as listed in AMION.   DG Pelvis 1-2 Views  Result Date: 03/24/2022 CLINICAL DATA:  Fall, pelvic pain EXAM: PELVIS - 1-2 VIEW COMPARISON:  None Available. FINDINGS: There is no evidence of pelvic fracture or diastasis. No pelvic bone lesions are seen. IMPRESSION: Negative. Electronically Signed   By: Helyn Numbers M.D.   On: 03/24/2022 01:57   MR BRAIN WO CONTRAST  Result Date: 03/23/2022 CLINICAL DATA:  Mental status change EXAM: MRI HEAD WITHOUT CONTRAST TECHNIQUE: Multiplanar, multiecho pulse sequences of the brain and surrounding structures were obtained without intravenous contrast. COMPARISON:  Brain MRI 01/30/2022 FINDINGS: Brain: Resolving diffusion abnormality in the right corona radiata. No acute abnormality. Multiple chronic microhemorrhages in a predominantly central distribution. There are old infarcts of the left basal and both thalami. The midline structures are normal. Vascular:  Major flow voids are preserved. Skull and upper cervical spine: Normal calvarium and skull base. Visualized upper cervical spine and soft tissues are normal. Sinuses/Orbits:Partial opacification of the left maxillary and sphenoid sinuses. Normal orbits. IMPRESSION: 1. No acute intracranial abnormality. 2. Resolving diffusion abnormality in the right corona radiata  from recent infarct. 3. Multiple chronic microhemorrhages in a predominantly central distribution, consistent with chronic hypertensive angiopathy. Electronically Deatra Robinsony: Kevin  Herman M.D.   On: 03/23/2022 23:44   CT Angio Chest PE W and/or Wo Contrast  Result Date: 03/23/2022 CLINICAL DATA:  Recent fall. EXAM: CT ANGIOGRAPHY CHEST WITH CONTRAST TECHNIQUE: Multidetector CT imaging of the chest was performed using the standard protocol during bolus administration of intravenous contrast. Multiplanar CT image reconstructions and MIPs were obtained to evaluate the vascular anatomy. RADIATION DOSE REDUCTION: This exam was performed according to the departmental dose-optimization program which includes automated exposure control, adjustment of the mA and/or kV according to patient size and/or use of iterative reconstruction technique. CONTRAST:  75mL OMNIPAQUE IOHEXOL 350 MG/ML SOLN COMPARISON:  February 04, 2022 FINDINGS: Cardiovascular: Satisfactory opacification of the pulmonary arteries to the segmental level. No evidence of pulmonary embolism. There is mild to moderate severity cardiomegaly. A small to moderate sized pericardial effusion is seen (maximum thickness of 1.3 cm in AP measurement). This is increased in size when compared to the prior exam. Mediastinum/Nodes: No enlarged mediastinal, hilar, or axillary lymph nodes. Thyroid gland, trachea, and esophagus demonstrate no significant findings. Lungs/Pleura: Mild, hazy atelectatic changes are seen within the bilateral upper lobes and bilateral lower lobes. There is no evidence of a pleural effusion or pneumothorax. Upper Abdomen: No acute abnormality. Musculoskeletal: No chest wall abnormality. No acute or significant osseous findings. Review of the MIP images confirms the above findings. IMPRESSION: 1. No evidence of pulmonary embolism. 2. Mild to moderate severity cardiomegaly with a small to moderate sized pericardial effusion. 3. Mild, hazy bilateral  upper lobe and bilateral lower lobe atelectasis. Electronically Signed   By: Aram Candela M.D.   On: 03/23/2022 19:38   CT HEAD WO CONTRAST  Result Date: 03/23/2022 CLINICAL DATA:  Neurological deficit EXAM: CT HEAD WITHOUT CONTRAST TECHNIQUE: Contiguous axial images were obtained from the base of the skull through the vertex without intravenous contrast. RADIATION DOSE REDUCTION: This exam was performed according to the departmental dose-optimization program which includes automated exposure control, adjustment of the mA and/or kV according to patient size and/or use of iterative reconstruction technique. COMPARISON:  Previous studies including MR brain done on 01/30/2022 and CT done on 01/22/2022 FINDINGS: Brain: No acute intracranial findings are seen. There are no signs of bleeding within the cranium. Cavum septum vergae and cavum septum pellucidum are noted. Ventricles are not dilated. There are old lacunar infarcts in basal ganglia on both sides with no significant interval change. Vascular: Scattered arterial calcifications are seen. Skull: Unremarkable. Sinuses/Orbits: Unremarkable. Other: None IMPRESSION: No acute intracranial findings are seen in noncontrast CT brain. Electronically Signed   By: Ernie Avena M.D.   On: 03/23/2022 15:42   DG Chest Port 1 View  Result Date: 03/23/2022 CLINICAL DATA:  Tachycardia EXAM: PORTABLE CHEST 1 VIEW COMPARISON:  02/20/2022 FINDINGS: Cardiomegaly. Both lungs are clear. The visualized skeletal structures are unremarkable. IMPRESSION: Cardiomegaly without acute abnormality of the lungs in AP portable projection. Electronically Signed   By: Jearld Lesch M.D.   On: 03/23/2022 15:32     Time coordinating discharge: Over 30 minutes    Lewie Chamber,  MD  [facility] 04/21/2022, 4:07 PM

## 2022-04-21 NOTE — Evaluation (Signed)
Occupational Therapy Evaluation Patient Details Name: Cindy Brooks MRN: [id number] DOB: 1971/02/20 Today's Date: 04/21/2022   History of Present Illness Pt admitted 04/19/22 with UTI. Recent h/o of stroke with right sided weakness. Family was caring for pt at home PTA. PMH signifiant for but not limited to: HTN, seizure, stroke, PEG tube.   Clinical Impression   Patient admitted for above and presents with problem list below, per chart review needing assist for Adls and mobility since last admission.  She currently completes grooming with max assist and all other ADLs with total assist, max assist for bed mobility and min guard for sitting EOB.  Pt lethargic today, but following most simple 1 step commands; limited verbalizations or engagement with therapist but nodding head or responding with 1 word responses intermittently.  Pt is aware she is at the hospital. Anticipate she will be able to progress towards PLOF and return back home provided 24/7 support still available.  Will follow acutely.      Recommendations for follow up therapy are one component of a multi-disciplinary discharge planning process, led by the attending physician.  Recommendations may be updated based on patient status, additional functional criteria and insurance authorization.   Follow Up Recommendations  Home health OT (continue services PTA (HH OT, PT, aide?))    Assistance Recommended at Discharge Frequent or constant Supervision/Assistance  Patient can return home with the following A lot of help with walking and/or transfers;A lot of help with bathing/dressing/bathroom;Assistance with cooking/housework;Assistance with feeding;Direct supervision/assist for medications management;Direct supervision/assist for financial management;Assist for transportation;Help with stairs or ramp for entrance    Functional Status Assessment  Patient has had a recent decline in their functional status and demonstrates the ability to  make significant improvements in function in a reasonable and predictable amount of time.  Equipment Recommendations  Other (comment) (TBD)    Recommendations for Other Services       Precautions / Restrictions Precautions Precautions: Fall Restrictions Weight Bearing Restrictions: No      Mobility Bed Mobility Overal bed mobility: Needs Assistance Bed Mobility: Sit to Supine, Supine to Sit     Supine to sit: Max assist Sit to supine: Max assist   General bed mobility comments: pt able to initate using L LE towards EOb but requires assist for R LE, scooting hips and trunk    Transfers                   General transfer comment: deferred due to lethargy      Balance Overall balance assessment: Needs assistance Sitting-balance support: No upper extremity supported, Feet supported Sitting balance-Leahy Scale: Fair Sitting balance - Comments: statically close min guard for several minutes at EOB                                   ADL either performed or assessed with clinical judgement   ADL Overall ADL's : Needs assistance/impaired     Grooming: Maximal assistance;Sitting;Bed level Grooming Details (indicate cue type and reason): hand over hand to reach face with L hand for washing face, increased time and effort required         Upper Body Dressing : Maximal assistance;Sitting   Lower Body Dressing: Total assistance;Sitting/lateral leans;Bed level     Toilet Transfer Details (indicate cue type and reason): deferred         Functional mobility during ADLs: Maximal assistance General ADL Comments:  limited to EOB     Vision   Vision Assessment?: Vision impaired- to be further tested in functional context Additional Comments: pt with difficulty opening L eye.  Minimal opening once sitting upright at EOB, able to locate therapist but difficutly scanning when I changed positions.  When reaching to give 'high five' good depth perception.  Continue assessment.     Perception     Praxis      Pertinent Vitals/Pain Pain Assessment Pain Assessment: Faces Faces Pain Scale: No hurt     Hand Dominance Right   Extremity/Trunk Assessment Upper Extremity Assessment Upper Extremity Assessment: RUE deficits/detail;LUE deficits/detail RUE Deficits / Details: attempts to use functionally to wash face, initally unable to sqeeuze hand but progressed to 3-/5 hand squeeze, grossly 2-/5 proximally.  PROM shoulder to approx 60*, WFL distal towards hand. RUE Coordination: decreased fine motor;decreased gross motor LUE Deficits / Details: using L UE to wash face but requires some support at elbow due to shoulder weakness/decreased AROM? She is able to squeeze hand with 3/5 strength, but limited proximally to 2-/5.  PROM shoulder limited to 60*, WFL distal. LUE Coordination: decreased fine motor;decreased gross motor   Lower Extremity Assessment Lower Extremity Assessment: Defer to PT evaluation       Communication Communication Communication: Expressive difficulties   Cognition Arousal/Alertness: Lethargic Behavior During Therapy: Flat affect Overall Cognitive Status: Difficult to assess                                 General Comments: pt lethargic, following most 1 step commands (simple) but limited engagement.  She occasionally nods her head or speaks 1 word but very limited.     General Comments  no family present    Exercises     Shoulder Instructions      Home Living Family/patient expects to be discharged to:: Private residence   Available Help at Discharge: Family;Available 24 hours/day Type of Home: House Home Access: Stairs to enter     Home Layout: One level     Bathroom Shower/Tub: Chief Strategy Officer: Standard         Additional Comments: per evla on 03/03/22- no family available and pt unable to report      Prior Functioning/Environment Prior Level of Function :  Needs assist;Patient poor historian/Family not available       Physical Assist : Mobility (physical);ADLs (physical) Mobility (physical): Bed mobility;Transfers;Gait;Stairs ADLs (physical): Grooming;Bathing;Dressing;IADLs;Toileting   ADLs Comments: per chart review needing assist for ADLs, PEG tube        OT Problem List: Decreased strength;Decreased range of motion;Decreased activity tolerance;Impaired balance (sitting and/or standing);Impaired vision/perception;Decreased coordination;Decreased cognition;Decreased safety awareness;Decreased knowledge of use of DME or AE;Decreased knowledge of precautions;Impaired sensation;Impaired UE functional use      OT Treatment/Interventions: Self-care/ADL training;Therapeutic exercise;Energy conservation;DME and/or AE instruction;Therapeutic activities;Cognitive remediation/compensation;Visual/perceptual remediation/compensation;Patient/family education;Balance training;Neuromuscular education    OT Goals(Current goals can be found in the care plan section) Acute Rehab OT Goals Patient Stated Goal: none stated OT Goal Formulation: Patient unable to participate in goal setting Time For Goal Achievement: 05/05/22 Potential to Achieve Goals: Good  OT Frequency: Min 2X/week    Co-evaluation              AM-PAC OT "6 Clicks" Daily Activity     Outcome Measure Help from another person eating meals?: Total Help from another person taking care of personal grooming?: A Lot Help  from another person toileting, which includes using toliet, bedpan, or urinal?: Total Help from another person bathing (including washing, rinsing, drying)?: A Lot Help from another person to put on and taking off regular upper body clothing?: A Lot Help from another person to put on and taking off regular lower body clothing?: Total 6 Click Score: 9   End of Session Nurse Communication: Mobility status  Activity Tolerance: Patient limited by lethargy Patient left:  in bed;with call bell/phone within reach;with bed alarm set  OT Visit Diagnosis: Other abnormalities of gait and mobility (R26.89);Muscle weakness (generalized) (M62.81);Other symptoms and signs involving cognitive function;Cognitive communication deficit (R41.841)                Time: 1021-1173 OT Time Calculation (min): 19 min Charges:  OT General Charges $OT Visit: 1 Visit OT Evaluation $OT Eval Moderate Complexity: 1 Mod  Barry Brunner, OT Acute Rehabilitation Services Office [phone]   Chancy Milroy 04/21/2022, 8:59 AM

## 2022-04-21 NOTE — Plan of Care (Signed)

## 2022-04-24 LAB — CULTURE, BLOOD (ROUTINE X 2): Culture: NO GROWTH

## 2022-04-30 ENCOUNTER — Telehealth: Payer: Self-pay

## 2022-04-30 ENCOUNTER — Telehealth: Payer: Self-pay | Admitting: *Deleted

## 2022-04-30 NOTE — Telephone Encounter (Signed)
Copied from CRM [id number]. Topic: Quick Communication - Home Health Verbal Orders >> Apr 30, 2022  1:26 PM Everette C wrote: Caller/Agency: Romeo Apple / Melony Overly Number: [phone] Requesting OT/PT/Skilled Nursing/Social Work/Speech Therapy: Skilled Nursing  Frequency: evaluation for bed sores

## 2022-04-30 NOTE — Telephone Encounter (Signed)
Romeo Apple, PTA with [facility] requesting orders for nursing. States noted pt has "Stage 1 or 2 sore on left buttocks at hip bone, weeping clear fluid." States brownish drainage on bed pad where area would come in contact. Also reports family needs much education regarding pt's diabetic care. Requesting nurse. CB# [phone]. States can leave verbal order on secure VM. Please advise.

## 2022-05-02 ENCOUNTER — Ambulatory Visit: Payer: Self-pay | Admitting: *Deleted

## 2022-05-02 NOTE — Telephone Encounter (Signed)
Call received from [facility] [place] from [facility] #[phone] regarding request for verbal orders for nursing to see patient. See previous requesting for PCP to advise regarding "bed sores". Patient is homebound and non ambulatory. Orders were not ordered to resume nursing care after leaving hospital. Please advise and if no orders will be written or verbal please call Dellis Filbert at #[phone] with how to proceed. Reviewed with PTA if patient has worsening sx call back or send patient to ED.  Reason for Disposition  RN needs further essential information from caller in order to complete triage  Answer Assessment - Initial Assessment Questions 1. REASON FOR CALL or QUESTION: "What is your reason for calling today?" or "How can I best help you?" or "What question do you have that I can help answer?"     PTA from [facility] calling to request verbal orders for nursing to see patient due to bed sores.  Protocols used: Information Only Call - No Triage-A-AH

## 2022-05-03 NOTE — Telephone Encounter (Signed)
Verbal orders were left for patient. 

## 2022-05-09 NOTE — Telephone Encounter (Signed)
Orders has been refaxed.

## 2022-05-09 NOTE — Telephone Encounter (Signed)
Cindy Brooks  Best contact: [phone] (this number is both for phone and fax)  Has 4 orders altogether, really needs the June orders completed

## 2022-05-09 NOTE — Telephone Encounter (Signed)
Olegario Messier from [facility] called to request signed forms for the patient's speech therapy, says they are missing this from June. Please advise Order number: [id number] dated 04/04/2022

## 2022-05-11 ENCOUNTER — Emergency Department (HOSPITAL_COMMUNITY)
Admission: EM | Admit: 2022-05-11 | Discharge: 2022-05-12 | Disposition: A | Discharge status: Home / Self Care | Payer: No Typology Code available for payment source | Attending: Emergency Medicine | Admitting: Emergency Medicine

## 2022-05-11 DIAGNOSIS — T85528A Displacement of other gastrointestinal prosthetic devices, implants and grafts, initial encounter: Secondary | ICD-10-CM

## 2022-05-11 DIAGNOSIS — K9423 Gastrostomy malfunction: Secondary | ICD-10-CM | POA: Insufficient documentation

## 2022-05-11 DIAGNOSIS — I10 Essential (primary) hypertension: Secondary | ICD-10-CM | POA: Diagnosis not present

## 2022-05-11 DIAGNOSIS — Z79899 Other long term (current) drug therapy: Secondary | ICD-10-CM | POA: Insufficient documentation

## 2022-05-11 NOTE — ED Notes (Signed)
Daughter Isadora Delorey [phone] wants an update asap

## 2022-05-11 NOTE — ED Triage Notes (Signed)
Pt via [facility] for eval of g-tube displacement, family concern for ongoing UTI despite tx, last abx today. Hx CVA, residual deficits   22g L hand LR   140/90 HR 105

## 2022-05-12 ENCOUNTER — Emergency Department (HOSPITAL_COMMUNITY): Payer: No Typology Code available for payment source

## 2022-05-12 LAB — COMPREHENSIVE METABOLIC PANEL
ALT: 15 U/L (ref 0–44)
AST: 17 U/L (ref 15–41)
Albumin: 3.5 g/dL (ref 3.5–5.0)
Alkaline Phosphatase: 89 U/L (ref 38–126)
Anion gap: 9 (ref 5–15)
BUN: 11 mg/dL (ref 6–20)
CO2: 25 mmol/L (ref 22–32)
Calcium: 9.5 mg/dL (ref 8.9–10.3)
Chloride: 109 mmol/L (ref 98–111)
Creatinine, Ser: 0.8 mg/dL (ref 0.44–1.00)
GFR, Estimated: >60 mL/min (ref >60)
Glucose, Bld: 129 mg/dL — ABNORMAL HIGH (ref 70–99)
Potassium: 3 mmol/L — ABNORMAL LOW (ref 3.5–5.1)
Sodium: 143 mmol/L (ref 135–145)
Total Bilirubin: 0.5 mg/dL (ref 0.3–1.2)
Total Protein: 7.1 g/dL (ref 6.5–8.1)

## 2022-05-12 LAB — CBC WITH DIFFERENTIAL/PLATELET
Abs Immature Granulocytes: 0.02 10*3/uL (ref 0.00–0.07)
Basophils Absolute: 0 10*3/uL (ref 0.0–0.1)
Basophils Relative: 1 %
Eosinophils Absolute: 0.1 10*3/uL (ref 0.0–0.5)
Eosinophils Relative: 1 %
HCT: 36.8 % (ref 36.0–46.0)
Hemoglobin: 12.2 g/dL (ref 12.0–15.0)
Immature Granulocytes: 0 %
Lymphocytes Relative: 24 %
Lymphs Abs: 2.1 10*3/uL (ref 0.7–4.0)
MCH: 26.3 pg (ref 26.0–34.0)
MCHC: 33.2 g/dL (ref 30.0–36.0)
MCV: 79.3 fL — ABNORMAL LOW (ref 80.0–100.0)
Monocytes Absolute: 0.7 10*3/uL (ref 0.1–1.0)
Monocytes Relative: 8 %
Neutro Abs: 5.7 10*3/uL (ref 1.7–7.7)
Neutrophils Relative %: 66 %
Platelets: 276 10*3/uL (ref 150–400)
RBC: 4.64 MIL/uL (ref 3.87–5.11)
RDW: 16.1 % — ABNORMAL HIGH (ref 11.5–15.5)
WBC: 8.6 10*3/uL (ref 4.0–10.5)
nRBC: 0 % (ref 0.0–0.2)

## 2022-05-12 NOTE — Discharge Instructions (Addendum)
Please follow-up with your primary care provider to discuss need for feeding tube.  If the decision is made between you and your doctor that a feeding tube is needed, you have been provided with a referral to interventional radiology who will be able to replace the tube.  If you decide that the feeding tube is no longer needed, you do not have to worry about following up with interventional radiology.  In the meantime, please keep the feeding tube insertion site clean, dry and bandaged.

## 2022-05-12 NOTE — ED Provider Notes (Signed)
[facility] EMERGENCY DEPARTMENT Provider Note  CSN: [id number] Arrival date & time: 05/11/22 2149  Chief Complaint(s) G-Tube Displaced  HPI Cindy Brooks is a 51 y.o. female with a past medical history listed below including prior CVA with residual dysphagia previously requiring PEG, but has been feeding orally and no longer requiring PEG tube feeds for over a month is here for PEG tube dislodgment.  Due to her stroke patient is nonverbal.  I spoke to the daughter who reports that she last saw the PEG tube and placed earlier this morning.  She spoke with the home health on-call nurse who recommended the patient be brought in for evaluation regarding PEG tube replacement.  Otherwise patient has been at her normal state of health.  She was recently admitted for sepsis related to catheter associated urinary tract infection and has completed her antibiotics.  She has been feeding well without emesis and taking all her medications orally.  HPI  Past Medical History Past Medical History:  Diagnosis Date   Hypertension    Hypertensive urgency 01/20/2022   Seizure (HCC) 01/28/2022   Stroke (HCC)    TIA (transient ischemic attack) 2019   Urinary tract infection 01/28/2022   Patient Active Problem List   Diagnosis Date Noted   Catheter-associated urinary tract infection (HCC) 04/20/2022   Sepsis secondary to UTI (HCC) 04/19/2022   Acute encephalopathy 03/24/2022   Tachycardia    Aphasia 03/23/2022   Dysphagia with PEG tube 03/20/2022   Acute urinary retention 03/20/2022   Normocytic anemia 03/20/2022   CVA (cerebral vascular accident) (HCC) 03/20/2022   Hypernatremia 02/07/2022   Coma (HCC) 02/07/2022   Antiphospholipid antibody with hypercoagulable state (HCC) 02/04/2022   Pressure injury of skin 02/01/2022   PEG  status (HCC) 01/29/2022   UTI (urinary tract infection) 01/28/2022   HTN (hypertension)    Acute bilateral thalamic, midbrain and bilateral cerebral  ischemic CVA 01/22/2022   Home Medication(s) Prior to Admission medications   Medication Sig Start Date End Date Taking? Authorizing Provider  acetaminophen (TYLENOL) 325 MG tablet Take 2 tablets (650 mg total) by mouth every 4 (four) hours as needed. Patient not taking: Reported on 04/20/2022 03/21/22 03/21/23  Almon Hercules, MD  amantadine (SYMMETREL) 100 MG capsule Take 1 capsule (100 mg total) by mouth 2 (two) times daily. 03/21/22 05/20/22  Almon Hercules, MD  amLODipine (NORVASC) 10 MG tablet Take 1 tablet (10 mg total) by mouth daily. 03/21/22   Almon Hercules, MD  atorvastatin (LIPITOR) 80 MG tablet Take 1 tablet (80 mg total) by mouth daily. 03/21/22   Almon Hercules, MD  clopidogrel (PLAVIX) 75 MG tablet Take 1 tablet (75 mg total) by mouth daily. 03/21/22   Almon Hercules, MD  folic acid (FOLVITE) 1 MG tablet Take 1 tablet (1 mg total) by mouth daily. 03/21/22   Almon Hercules, MD  gabapentin (NEURONTIN) 100 MG capsule Take 1 capsule (100 mg total) by mouth 2 (two) times daily. 03/21/22   Almon Hercules, MD  glycopyrrolate (ROBINUL) 1 MG tablet Take 0.5 tablets (0.5 mg total) by mouth 2 (two) times daily. 03/21/22   Almon Hercules, MD  hydrALAZINE (APRESOLINE) 50 MG tablet Take 1 tablet (50 mg total) by mouth every 8 (eight) hours. 03/21/22 05/20/22  Almon Hercules, MD  ipratropium-albuterol (DUONEB) 0.5-2.5 (3) MG/3ML SOLN Take 3 mLs by nebulization every 6 (six) hours as needed. Patient taking differently: Take 3 mLs by nebulization every 6 (six)  hours as needed (shortness of breath, wheezing). 03/21/22   Almon Hercules, MD  Multiple Vitamin (MULTIVITAMIN WITH MINERALS) TABS tablet Take 1 tablet by mouth daily. Patient not taking: Reported on 04/20/2022 03/21/22   Almon Hercules, MD  Nystatin (GERHARDT'S BUTT CREAM) CREA Apply 1 application. topically daily as needed for irritation. Patient not taking: Reported on 04/20/2022 03/21/22   Almon Hercules, MD  polyethylene glycol powder (MIRALAX) 17 GM/SCOOP powder Take 17 g by mouth  2 (two) times daily as needed for moderate constipation. 03/21/22   Almon Hercules, MD  propranolol (INDERAL) 40 MG tablet Take 1 tablet (40 mg total) by mouth 3 (three) times daily. 03/21/22 05/20/22  Almon Hercules, MD  senna-docusate (SENOKOT-S) 8.6-50 MG tablet Take 1 tablet by mouth 2 (two) times daily between meals as needed for mild constipation. Patient not taking: Reported on 04/20/2022 03/21/22   Almon Hercules, MD  thiamine 100 MG tablet Take 1 tablet (100 mg total) by mouth daily. 03/21/22   Almon Hercules, MD                                                                                                                                    Allergies Aspirin, Codeine, Penicillins, and Strawberry extract  Review of Systems Review of Systems As noted in HPI  Physical Exam Vital Signs  I have reviewed the triage vital signs BP (!) 135/100 (BP Location: Right Arm)   Pulse (!) 103   Temp 98.4 F (36.9 C) (Oral)   Resp 16   LMP  (LMP Unknown)   SpO2 97%   Physical Exam Vitals reviewed.  Constitutional:      General: She is not in acute distress.    Appearance: She is well-developed. She is not diaphoretic.  HENT:     Head: Normocephalic and atraumatic.     Nose: Nose normal.  Eyes:     General: No scleral icterus.       Right eye: No discharge.        Left eye: No discharge.     Conjunctiva/sclera: Conjunctivae normal.     Pupils: Pupils are equal, round, and reactive to light.  Cardiovascular:     Rate and Rhythm: Normal rate and regular rhythm.     Heart sounds: No murmur heard.    No friction rub. No gallop.  Pulmonary:     Effort: Pulmonary effort is normal. No respiratory distress.     Breath sounds: Normal breath sounds. No stridor. No rales.  Abdominal:     General: There is no distension.     Palpations: Abdomen is soft.     Tenderness: There is no abdominal tenderness.    Musculoskeletal:        General: No tenderness.     Cervical back: Normal range of motion and  neck supple.  Skin:    General: Skin is warm and  dry.     Findings: No erythema or rash.  Neurological:     Mental Status: She is alert and oriented to person, place, and time.     ED Results and Treatments Labs (all labs ordered are listed, but only abnormal results are displayed) Labs Reviewed  CBC WITH DIFFERENTIAL/PLATELET - Abnormal; Notable for the following components:      Result Value   MCV 79.3 (*)    RDW 16.1 (*)    All other components within normal limits  COMPREHENSIVE METABOLIC PANEL - Abnormal; Notable for the following components:   Potassium 3.0 (*)    Glucose, Bld 129 (*)    All other components within normal limits                                                                                                                         EKG  EKG Interpretation  Date/Time:    Ventricular Rate:    PR Interval:    QRS Duration:   QT Interval:    QTC Calculation:   R Axis:     Text Interpretation:         Radiology DG ABD ACUTE 2+V W 1V CHEST  Result Date: 05/12/2022 CLINICAL DATA:  Recent gastrostomy displacement EXAM: DG ABDOMEN ACUTE WITH 1 VIEW CHEST COMPARISON:  04/19/2022 FINDINGS: Scattered bowel gas is noted. No obstructive changes are seen. No free air is noted. No bony abnormality is seen. Cardiac shadow is prominent but stable. Lungs are clear bilaterally. No bony abnormality is seen. IMPRESSION: No acute abnormality in the chest and abdomen. Electronically Signed   By: Alcide Clever M.D.   On: 05/12/2022 02:19    Pertinent labs & imaging results that were available during my care of the patient were reviewed by me and considered in my medical decision making (see MDM for details).  Medications Ordered in ED Medications - No data to display                                                                                                                                   Procedures Procedures  (including critical care time)  Medical Decision  Making / ED Course    Complexity of Problem:  Co-morbidities/SDOH that complicate the patient evaluation/care: Nonverbal PMHx noted in HPI  Additional history obtained: Daughter Prior admission notes.  Multiple notes stating patient was feeding and taking medications  orally.  PEG tube was not used throughout her last admission.  Patient's presenting problem/concern, DDX, and MDM listed below: PEG tube dislodgment Patient is no longer PEG tube dependent Abdomen benign We will obtain screening labs to ensure there is no evidence of electrolyte or metabolic derangements. After speaking with daughter, will attempt to place a small caliber Foley to keep the the site open. She does not require emergent PEG tube replacement at this time.   Complexity of Data:   Laboratory Tests ordered listed below with my independent interpretation: CBC without leukocytosis or anemia No significant electrolyte derangements or renal sufficiency   Imaging Studies ordered listed below with my independent interpretation: Acute abdominal series without evidence of free air.     ED Course:    Assessment, Add'l Intervention, and Reassessment: PEG tube dislodgment I was unable to insert a 16 French Foley catheter.  I was able to get through the soft tissue but not into the stomach itself.  I believe the opening is closed Labs are reassuring Patient's daughter was updated. Recommend she contact primary care provider and discuss need for PEG tube replacement. I will provide with a ambulatory referral to interventional radiology in case they deem her PEG tube replacement necessary.  This can be done outpatient.    Final Clinical Impression(s) / ED Diagnoses Final diagnoses:  Dislodged gastrostomy tube   The patient appears reasonably screened and/or stabilized for discharge and I doubt any other medical condition or other [facility] requiring further screening, evaluation, or treatment in the ED at this time. I  have discussed the findings, Dx and Tx plan with the patient/family who expressed understanding and agree(s) with the plan. Discharge instructions discussed at length. The patient/family was given strict return precautions who verbalized understanding of the instructions. No further questions at time of discharge.  Disposition: Discharge  Condition: Good  ED Discharge Orders          Ordered    Ambulatory referral to Interventional Radiology        05/12/22 0242             Follow Up: Primary care provider  Call  to schedule an appointment for close follow up to discuss need for feeding tube           This chart was dictated using voice recognition software.  Despite best efforts to proofread,  errors can occur which can change the documentation meaning.    Nira Conn, MD 05/12/22 [phone]

## 2022-05-12 NOTE — ED Notes (Signed)
Patient verbalizes understanding of discharge instructions. Opportunity for questioning and answers were provided. Armband removed by staff, pt discharged from ED. Left via gurney with PTAR

## 2022-05-12 NOTE — ED Notes (Signed)
Ptar called 

## 2022-05-14 ENCOUNTER — Ambulatory Visit: Payer: Medicaid Other | Attending: Nurse Practitioner | Admitting: Family Medicine

## 2022-05-14 ENCOUNTER — Encounter: Payer: No Typology Code available for payment source | Admitting: Hematology & Oncology

## 2022-05-14 ENCOUNTER — Encounter: Payer: Self-pay | Admitting: Family Medicine

## 2022-05-14 VITALS — BP 99/77 | HR 88 | Temp 97.9°F

## 2022-05-14 DIAGNOSIS — F0631 Mood disorder due to known physiological condition with depressive features: Secondary | ICD-10-CM | POA: Diagnosis not present

## 2022-05-14 DIAGNOSIS — I69359 Hemiplegia and hemiparesis following cerebral infarction affecting unspecified side: Secondary | ICD-10-CM | POA: Diagnosis not present

## 2022-05-14 DIAGNOSIS — I69398 Other sequelae of cerebral infarction: Secondary | ICD-10-CM

## 2022-05-14 DIAGNOSIS — D6861 Antiphospholipid syndrome: Secondary | ICD-10-CM | POA: Diagnosis not present

## 2022-05-14 DIAGNOSIS — Z931 Gastrostomy status: Secondary | ICD-10-CM

## 2022-05-14 DIAGNOSIS — I10 Essential (primary) hypertension: Secondary | ICD-10-CM

## 2022-05-14 DIAGNOSIS — Z978 Presence of other specified devices: Secondary | ICD-10-CM

## 2022-05-14 MED ORDER — DULOXETINE HCL 60 MG PO CPEP: 60.0000 mg | ORAL_CAPSULE | Freq: Every day | ORAL | 3 refills | Status: DC

## 2022-05-14 MED ORDER — ATORVASTATIN CALCIUM 80 MG PO TABS: 80.0000 mg | ORAL_TABLET | Freq: Every day | ORAL | 3 refills | Status: DC

## 2022-05-14 MED ORDER — GABAPENTIN 100 MG PO CAPS: 100.0000 mg | ORAL_CAPSULE | Freq: Two times a day (BID) | ORAL | 3 refills | Status: DC

## 2022-05-14 MED ORDER — HYDRALAZINE HCL 50 MG PO TABS: 50.0000 mg | ORAL_TABLET | Freq: Three times a day (TID) | ORAL | 3 refills | Status: DC

## 2022-05-14 MED ORDER — PROPRANOLOL HCL 20 MG PO TABS
20.0000 mg | ORAL_TABLET | Freq: Three times a day (TID) | ORAL | 0 refills | Status: DC
Start: 2022-05-14 — End: 2022-05-28

## 2022-05-14 MED ORDER — AMLODIPINE BESYLATE 10 MG PO TABS
10.0000 mg | ORAL_TABLET | Freq: Every day | ORAL | 3 refills | Status: DC
Start: 2022-05-14 — End: 2022-05-28

## 2022-05-14 NOTE — Progress Notes (Incomplete)
Subjective:  Patient ID: Cindy Brooks, female    DOB: Mar 12, 1971  Age: 51 y.o. MRN: [id number]  CC: Hospitalization Follow-up   HPI Cindy Brooks is a 51 y.o. 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 for sepsis secondary to UTI.  She was treated with IV antibiotics and Foley catheter changed.  Interval History: She had an ED visit 2 days ago due to dislodged gastrostomy tube and per notes she is feeding orally.  Daughter states Mom pulled out the gastrostomy tube.  She still has a Foley catheter in place since discharge in 03/2022 and this was changed last m.onth.  She has Physical Therapy twice/wk  and had only one speech therapy  Past Medical History:  Diagnosis Date  . Hypertension   . Hypertensive urgency 01/20/2022  . Seizure (HCC) 01/28/2022  . Stroke (HCC)   . TIA (transient ischemic attack) 2019  . Urinary tract infection 01/28/2022    Past Surgical History:  Procedure Laterality Date  . ESOPHAGOGASTRODUODENOSCOPY N/A 01/28/2022   Procedure: ESOPHAGOGASTRODUODENOSCOPY (EGD);  Surgeon: Diamantina Monks, MD;  Location: [facility] ENDOSCOPY;  Service: General;  Laterality: N/A;  . PEG PLACEMENT N/A 01/28/2022   Procedure: PERCUTANEOUS ENDOSCOPIC GASTROSTOMY (PEG) PLACEMENT;  Surgeon: Diamantina Monks, MD;  Location: [facility] ENDOSCOPY;  Service: General;  Laterality: N/A;    Family History  Problem Relation Age of Onset  . Diabetes Mellitus II Maternal Grandmother     Social History   Socioeconomic History  . Marital status: Single    Spouse name: Not on file  . Number of children: Not on file  . Years of education: Not on file  . Highest education level: Not on file  Occupational History  . Not on file  Tobacco Use  . Smoking status: Never  . Smokeless  tobacco: Never  Substance and Sexual Activity  . Alcohol use: Yes    Alcohol/week: 203.0 standard drinks of alcohol    Types: 84 Cans of beer, 119 Shots of liquor per week    Comment: 12pk/day and a fifth/day  . Drug use: Never  . Sexual activity: Not on file  Other Topics Concern  . Not on file  Social History Narrative  . Not on file   Social Determinants of Health   Financial Resource Strain: Not on file  Food Insecurity: Not on file  Transportation Needs: Not on file  Physical Activity: Not on file  Stress: Not on file  Social Connections: Not on file    Allergies  Allergen Reactions  . Aspirin Hives and Rash  . Codeine Hives and Rash  . Penicillins Anaphylaxis, Swelling and Other (See Comments)  . Strawberry Extract Anaphylaxis and Hives    Outpatient Medications Prior to Visit  Medication Sig Dispense Refill  . amantadine (SYMMETREL) 100 MG capsule Take 1 capsule (100 mg total) by mouth 2 (two) times daily. 60 capsule 1  . amLODipine (NORVASC) 10 MG tablet Take 1 tablet (10 mg total) by mouth daily. 30 tablet 1  . atorvastatin (LIPITOR) 80 MG tablet Take 1 tablet (80 mg total) by mouth daily. 90 tablet 1  . clopidogrel (PLAVIX) 75 MG tablet Take 1 tablet (75 mg total) by mouth daily. 30 tablet 1  . folic acid (FOLVITE) 1 MG tablet Take 1 tablet (1 mg total) by mouth  daily. 30 tablet 1  . gabapentin (NEURONTIN) 100 MG capsule Take 1 capsule (100 mg total) by mouth 2 (two) times daily. 60 capsule 1  . hydrALAZINE (APRESOLINE) 50 MG tablet Take 1 tablet (50 mg total) by mouth every 8 (eight) hours. 90 tablet 1  . ipratropium-albuterol (DUONEB) 0.5-2.5 (3) MG/3ML SOLN Take 3 mLs by nebulization every 6 (six) hours as needed. (Patient taking differently: Take 3 mLs by nebulization every 6 (six) hours as needed (shortness of breath, wheezing).) 360 mL 1  . Nystatin (GERHARDT'S BUTT CREAM) CREA Apply 1 application. topically daily as needed for irritation.    . propranolol  (INDERAL) 40 MG tablet Take 1 tablet (40 mg total) by mouth 3 (three) times daily. 180 tablet 0  . acetaminophen (TYLENOL) 325 MG tablet Take 2 tablets (650 mg total) by mouth every 4 (four) hours as needed. (Patient not taking: Reported on 04/20/2022) 100 tablet 2  . glycopyrrolate (ROBINUL) 1 MG tablet Take 0.5 tablets (0.5 mg total) by mouth 2 (two) times daily. (Patient not taking: Reported on 05/14/2022) 30 tablet 0  . Multiple Vitamin (MULTIVITAMIN WITH MINERALS) TABS tablet Take 1 tablet by mouth daily. (Patient not taking: Reported on 04/20/2022)    . polyethylene glycol powder (MIRALAX) 17 GM/SCOOP powder Take 17 g by mouth 2 (two) times daily as needed for moderate constipation. (Patient not taking: Reported on 05/14/2022) 255 g 0  . senna-docusate (SENOKOT-S) 8.6-50 MG tablet Take 1 tablet by mouth 2 (two) times daily between meals as needed for mild constipation. (Patient not taking: Reported on 04/20/2022) 180 tablet 0  . thiamine 100 MG tablet Take 1 tablet (100 mg total) by mouth daily. (Patient not taking: Reported on 05/14/2022) 30 tablet 1   No facility-administered medications prior to visit.     ROS Review of Systems *** Objective:  LMP  (LMP Unknown)      05/12/2022    3:37 AM 05/11/2022    9:56 PM 04/21/2022    3:54 PM  BP/Weight  Systolic BP 116 135 114  Diastolic BP 93 100 77      Physical Exam ***    Latest Ref Rng & Units 05/12/2022   12:52 AM 04/21/2022   11:49 AM 04/21/2022   12:21 AM  CMP  Glucose 70 - 99 mg/dL 409  96  811   BUN 6 - 20 mg/dL 11  6  14    Creatinine 0.44 - 1.00 mg/dL  9.14  7.82   Sodium 135 - 145 mmol/L 143  149  148   Potassium 3.5 - 5.1 mmol/L 3.0  3.2  5.9   Chloride 98 - 111 mmol/L 109  113  114   CO2 22 - 32 mmol/L 25  25  22    Calcium 8.9 - 10.3 mg/dL 9.5  8.9  9.0   Total Protein 6.5 - 8.1 g/dL 7.1     Total Bilirubin 0.3 - 1.2 mg/dL 0.5     Alkaline Phos 38 - 126 U/L 89     AST 15 - 41 U/L 17     ALT 0 - 44 U/L 15       Lipid  Panel     Component Value Date/Time   CHOL 225 (H) 01/22/2022 0315   TRIG 181 (H) 01/22/2022 0315   HDL 57 01/22/2022 0315   CHOLHDL 3.9 01/22/2022 0315   VLDL 36 01/22/2022 0315   LDLCALC 132 (H) 01/22/2022 0315    CBC    Component  Value Date/Time   WBC 8.6 05/12/2022 0052   RBC 4.64 05/12/2022 0052   HGB 12.2 05/12/2022 0052   HCT 36.8 05/12/2022 0052   PLT 276 05/12/2022 0052   MCV 79.3 (L) 05/12/2022 0052   MCH 26.3 05/12/2022 0052   MCHC 33.2 05/12/2022 0052   RDW 16.1 (H) 05/12/2022 0052   LYMPHSABS 2.1 05/12/2022 0052   MONOABS 0.7 05/12/2022 0052   EOSABS 0.1 05/12/2022 0052   BASOSABS 0.0 05/12/2022 0052    Lab Results  Component Value Date   HGBA1C 5.7 (H) 01/22/2022    Assessment & Plan:  There are no diagnoses linked to this encounter.  Health Care Maintenance: *** No orders of the defined types were placed in this encounter.   Follow-up: No follow-ups on file.       Hoy Register, MD, FAAFP. [facility] [place], [place] [phone]   05/14/2022, 2:49 PM

## 2022-05-14 NOTE — Patient Instructions (Signed)
Major Depressive Disorder, Adult Major depressive disorder (MDD) is a mental health condition. It may also be called clinical depression or unipolar depression. MDD causes symptoms of sadness, hopelessness, and loss of interest in things. These symptoms last most of the day, almost every day, for 2 weeks. MDD can also cause physical symptoms. It can interfere with relationships and with everyday activities, such as work, school, and activities that are usually pleasant. MDD may be mild, moderate, or severe. It may be single-episode MDD, which happens once, or recurrent MDD, which may occur multiple times. What are the causes? The exact cause of this condition is not known. MDD is most likely caused by a combination of things, which may include: Your personality traits. Learned or conditioned behaviors or thoughts or feelings that reinforce negativity. Any alcohol or substance misuse. Long-term (chronic) physical or mental health illness. Going through a traumatic experience or major life changes. What increases the risk? The following factors may make someone more likely to develop MDD: A family history of depression. Being a woman. Troubled family relationships. Abnormally low levels of certain brain chemicals. Traumatic or painful events in childhood, especially abuse or loss of a parent. A lot of stress from life experiences, such as poor living conditions or discrimination. Chronic physical illness or other mental health disorders. What are the signs or symptoms? The main symptoms of MDD usually include: Constant depressed or irritable mood. A loss of interest in things and activities. Other symptoms include: Sleeping or eating too much or too little. Unexplained weight gain or weight loss. Tiredness or low energy. Being agitated, restless, or weak. Feeling hopeless, worthless, or guilty. Trouble thinking clearly or making decisions. Thoughts of suicide or thoughts of harming  others. Isolating oneself or avoiding other people or activities. Trouble completing tasks, work, or any normal obligations. Severe symptoms of this condition may include: Psychotic depression.This may include false beliefs, or delusions. It may also include seeing, hearing, tasting, smelling, or feeling things that are not real (hallucinations). Chronic depression or persistent depressive disorder. This is low-level depression that lasts for at least 2 years. Melancholic depression, or feeling extremely sad and hopeless. Catatonic depression, which includes trouble speaking and trouble moving. How is this diagnosed? This condition may be diagnosed based on: Your symptoms. Your medical and mental health history. You may be asked questions about your lifestyle, including any drug and alcohol use. A physical exam. Blood tests to rule out other conditions. MDD is confirmed if you have the following symptoms most of the day, nearly every day, in a 2-week period: Either a depressed mood or loss of interest. At least four other MDD symptoms. How is this treated? This condition is usually treated by mental health professionals, such as psychologists, psychiatrists, and clinical social workers. You may need more than one type of treatment. Treatment may include: Psychotherapy, also called talk therapy or counseling. Types of psychotherapy include: Cognitive behavioral therapy (CBT). This teaches you to recognize unhealthy feelings, thoughts, and behaviors, and replace them with positive thoughts and actions. Interpersonal therapy (IPT). This helps you to improve the way you communicate with others or relate to them. Family therapy. This treatment includes members of your family. Medicines to treat anxiety and depression. These medicines help to balance the brain chemicals that affect your emotions. Lifestyle changes. You may be asked to: Limit alcohol use and avoid drug use. Get regular  exercise. Get plenty of sleep. Make healthy eating choices. Spend more time outdoors. Brain stimulation. This may   be done if symptoms are very severe and other treatments have not worked. Examples of this treatment are electroconvulsive therapy and transcranial magnetic stimulation. Follow these instructions at home: Activity Exercise regularly and spend time outdoors. Find activities that you enjoy doing, and make time to do them. Find healthy ways to manage stress, such as: Meditation or deep breathing. Spending time in nature. Journaling. Return to your normal activities as told by your health care provider. Ask your health care provider what activities are safe for you. Alcohol and drug use If you drink alcohol: Limit how much you use to: 0-1 drink a day for women who are not pregnant. 0-2 drinks a day for men. Be aware of how much alcohol is in your drink. In the [place] one drink equals one 12 oz bottle of beer (355 mL), one 5 oz glass of wine (148 mL), or one 1 oz glass of hard liquor (44 mL). Discuss your alcohol use with your health care provider. Alcohol can affect any antidepressant medicines you are taking. Discuss any drug use with your health care provider. General instructions  Take over-the-counter and prescription medicines only as told by your health care provider. Eat a healthy diet and get plenty of sleep. Consider joining a support group. Your health care provider may be able to recommend one. Keep all follow-up visits as told by your health care provider. This is important. Where to find more information National Alliance on Mental Illness: www.nami.org [place] National Institute of Mental Health: www.nimh.nih.gov Contact a health care provider if: Your symptoms get worse. You develop new symptoms. Get help right away if: You self-harm. You have serious thoughts about hurting yourself or others. You hallucinate. If you ever feel like you may hurt yourself or  others, or have thoughts about taking your own life, get help right away. Go to your nearest emergency department or: Call your local emergency services (911 in the [place] Call a suicide crisis helpline, such as the National Suicide Prevention Lifeline at 1-800-273-8255 or 988 in the [place] This is open 24 hours a day in the [place] Text the Crisis Text Line at 741741 (in the [place] Summary Major depressive disorder (MDD) is a mental health condition. MDD causes symptoms of sadness, hopelessness, and loss of interest in things. These symptoms last most of the day, almost every day, for 2 weeks. The symptoms of MDD can interfere with relationships and with everyday activities. Treatments and support are available for people who develop MDD. You may need more than one type of treatment. Get help right away if you have serious thoughts about hurting yourself or others. This information is not intended to replace advice given to you by your health care provider. Make sure you discuss any questions you have with your health care provider. Document Revised: 04/25/2021 Document Reviewed: 09/11/2019 Elsevier Patient Education  2023 [employer] Inc.  

## 2022-05-14 NOTE — Progress Notes (Unsigned)
No concerns. 

## 2022-05-15 LAB — BASIC METABOLIC PANEL
BUN/Creatinine Ratio: 14 (ref 9–23)
BUN: 12 mg/dL (ref 6–24)
CO2: 21 mmol/L (ref 20–29)
Calcium: 10.9 mg/dL — ABNORMAL HIGH (ref 8.7–10.2)
Chloride: 107 mmol/L — ABNORMAL HIGH (ref 96–106)
Creatinine, Ser: 0.85 mg/dL (ref 0.57–1.00)
Glucose: 126 mg/dL — ABNORMAL HIGH (ref 70–99)
Potassium: 3.5 mmol/L (ref 3.5–5.2)
Sodium: 145 mmol/L — ABNORMAL HIGH (ref 134–144)
eGFR: 83 mL/min/{1.73_m2} (ref >59)

## 2022-05-15 NOTE — Telephone Encounter (Signed)
Home Health Verbal Orders - Caller/Agency: Olegario Messier from [facility] Callback Number: [phone] and fx # are the same Requesting:  plan of care ,resumption of care, physician order to evaluate and treat, add on discipline care no other visits Frequency: ?

## 2022-05-15 NOTE — Progress Notes (Signed)
Subjective:  Patient ID: Cindy Brooks, female    DOB: 1971/04/08  Age: 51 y.o. MRN: [id number]  CC: Hospitalization Follow-up   HPI Bostynn Hitchings is a 51 y.o. 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 Hospitalized 01/2022 - 03/2022 for bilateral thalamic, midbrain, bilateral cerebral infarctions and required PEG tube placement, tracheostomy during hospitalization.  Hypercoagulable labs were also positive for antiphospholipid syndrome She was hospitalized 04/19/2022 through 04/21/2022 at [facility] for sepsis secondary to UTI.  She was treated with IV antibiotics and Foley catheter changed.  Interval History: She had an ED visit 2 days ago due to dislodged gastrostomy tube and per notes she is feeding orally. Daughter states Mom pulled out the gastrostomy tube. She has a Foley catheter in place since discharge in 03/2022 and this was changed last month at ED visit.  She has Physical Therapy twice/wk  and had only one speech therapy session.  Per daughter the patient is able to utter monosyllables and is feeding orally. She remains weak in all her extremities and is wheelchair-bound. Since discharge in 03/2022 she has not seen neurology or urology but does have an upcoming appointment with neurology but none with urology. Past Medical History:  Diagnosis Date   Hypertension    Hypertensive urgency 01/20/2022   Seizure (HCC) 01/28/2022   Stroke [facility])    TIA (transient ischemic attack) 2019   Urinary tract infection 01/28/2022    Past Surgical History:  Procedure Laterality Date   ESOPHAGOGASTRODUODENOSCOPY N/A 01/28/2022   Procedure: ESOPHAGOGASTRODUODENOSCOPY (EGD);  Surgeon: Diamantina Monks, MD;  Location: [facility] ENDOSCOPY;  Service: General;  Laterality: N/A;   PEG PLACEMENT N/A 01/28/2022   Procedure: PERCUTANEOUS ENDOSCOPIC GASTROSTOMY (PEG) PLACEMENT;   Surgeon: Diamantina Monks, MD;  Location: [facility] ENDOSCOPY;  Service: General;  Laterality: N/A;    Family History  Problem Relation Age of Onset   Diabetes Mellitus II Maternal Grandmother     Social History   Socioeconomic History   Marital status: Single    Spouse name: Not on file   Number of children: Not on file   Years of education: Not on file   Highest education level: Not on file  Occupational History   Not on file  Tobacco Use   Smoking status: Never   Smokeless tobacco: Never  Substance and Sexual Activity   Alcohol use: Yes    Alcohol/week: 203.0 standard drinks of alcohol    Types: 84 Cans of beer, 119 Shots of liquor per week    Comment: 12pk/day and a fifth/day   Drug use: Never   Sexual activity: Not on file  Other Topics Concern   Not on file  Social History Narrative   Not on file   Social Determinants of Health   Financial Resource Strain: Not on file  Food Insecurity: Not on file  Transportation Needs: Not on file  Physical Activity: Not on file  Stress: Not on file  Social Connections: Not on file    Allergies  Allergen Reactions   Aspirin Hives and Rash   Codeine Hives and Rash   Penicillins Anaphylaxis, Swelling and Other (See Comments)   Strawberry Extract Anaphylaxis and Hives    Outpatient Medications Prior to Visit  Medication Sig Dispense Refill   amantadine (SYMMETREL) 100 MG capsule Take 1 capsule (100 mg total) by mouth 2 (two) times daily. 60 capsule 1  clopidogrel (PLAVIX) 75 MG tablet Take 1 tablet (75 mg total) by mouth daily. 30 tablet 1   folic acid (FOLVITE) 1 MG tablet Take 1 tablet (1 mg total) by mouth daily. 30 tablet 1   ipratropium-albuterol (DUONEB) 0.5-2.5 (3) MG/3ML SOLN Take 3 mLs by nebulization every 6 (six) hours as needed. (Patient taking differently: Take 3 mLs by nebulization every 6 (six) hours as needed (shortness of breath, wheezing).) 360 mL 1   Nystatin (GERHARDT'S BUTT CREAM) CREA Apply 1 application.  topically daily as needed for irritation.     amLODipine (NORVASC) 10 MG tablet Take 1 tablet (10 mg total) by mouth daily. 30 tablet 1   atorvastatin (LIPITOR) 80 MG tablet Take 1 tablet (80 mg total) by mouth daily. 90 tablet 1   gabapentin (NEURONTIN) 100 MG capsule Take 1 capsule (100 mg total) by mouth 2 (two) times daily. 60 capsule 1   hydrALAZINE (APRESOLINE) 50 MG tablet Take 1 tablet (50 mg total) by mouth every 8 (eight) hours. 90 tablet 1   propranolol (INDERAL) 40 MG tablet Take 1 tablet (40 mg total) by mouth 3 (three) times daily. 180 tablet 0   acetaminophen (TYLENOL) 325 MG tablet Take 2 tablets (650 mg total) by mouth every 4 (four) hours as needed. (Patient not taking: Reported on 04/20/2022) 100 tablet 2   glycopyrrolate (ROBINUL) 1 MG tablet Take 0.5 tablets (0.5 mg total) by mouth 2 (two) times daily. (Patient not taking: Reported on 05/14/2022) 30 tablet 0   Multiple Vitamin (MULTIVITAMIN WITH MINERALS) TABS tablet Take 1 tablet by mouth daily. (Patient not taking: Reported on 04/20/2022)     polyethylene glycol powder (MIRALAX) 17 GM/SCOOP powder Take 17 g by mouth 2 (two) times daily as needed for moderate constipation. (Patient not taking: Reported on 05/14/2022) 255 g 0   senna-docusate (SENOKOT-S) 8.6-50 MG tablet Take 1 tablet by mouth 2 (two) times daily between meals as needed for mild constipation. (Patient not taking: Reported on 04/20/2022) 180 tablet 0   thiamine 100 MG tablet Take 1 tablet (100 mg total) by mouth daily. (Patient not taking: Reported on 05/14/2022) 30 tablet 1   No facility-administered medications prior to visit.     ROS Review of Systems Review of systems obtained  Objective:  BP 99/77   Pulse 88   Temp 97.9 F (36.6 C) (Oral)   LMP  (LMP Unknown)   SpO2 98%      05/14/2022    2:48 PM 05/12/2022    3:37 AM 05/11/2022    9:56 PM  BP/Weight  Systolic BP 99 [id number]  Diastolic BP 77 93 [id number]      Physical Exam Constitutional: normal appearing,  sitting comfortably in a wheelchair. HEENT: Head is atraumatic, normal sinuses, normal oropharynx, normal appearing tonsils and palate, tympanic membrane is normal bilaterally. Neck: Flexed position maintained throughout encounter Cardiovascular: normal rate and rhythm, normal heart sounds, no murmurs, rub or gallop, no pedal edema Respiratory: Normal breath sounds, clear to auscultation bilaterally, no wheezes, no rales, no rhonchi Abdomen: soft, not tender to palpation, normal bowel sounds, no enlarged organs.  Site of PEG tube with no evidence of infection, clean dressing over it. Musculoskeletal: Reduced ROM Skin: warm and dry, no lesions. Neurological: alert, oriented x3, cranial nerves I-XII grossly intact , reduced motor strength, Psychological: dysphoric mood.     Latest Ref Rng & Units 05/14/2022    4:38 PM 05/12/2022   12:52 AM 04/21/2022   11:49 AM  CMP  Glucose 70 - 99 mg/dL 622  297  96   BUN 6 - 24 mg/dL 12  11  6    Creatinine 0.57 - 1.00 mg/dL  9.89  2.11   Sodium 134 - 144 mmol/L 145  143  149   Potassium 3.5 - 5.2 mmol/L 3.5  3.0  3.2   Chloride 96 - 106 mmol/L 107  109  113   CO2 20 - 29 mmol/L 21  25  25    Calcium 8.7 - 10.2 mg/dL 9.41  9.5  8.9   Total Protein 6.5 - 8.1 g/dL  7.1    Total Bilirubin 0.3 - 1.2 mg/dL  0.5    Alkaline Phos 38 - 126 U/L  89    AST 15 - 41 U/L  17    ALT 0 - 44 U/L  15      Lipid Panel     Component Value Date/Time   CHOL 225 (H) 01/22/2022 0315   TRIG 181 (H) 01/22/2022 0315   HDL 57 01/22/2022 0315   CHOLHDL 3.9 01/22/2022 0315   VLDL 36 01/22/2022 0315   LDLCALC 132 (H) 01/22/2022 0315    CBC    Component Value Date/Time   WBC 8.6 05/12/2022 0052   RBC 4.64 05/12/2022 0052   HGB 12.2 05/12/2022 0052   HCT 36.8 05/12/2022 0052   PLT 276 05/12/2022 0052   MCV 79.3 (L) 05/12/2022 0052   MCH 26.3 05/12/2022 0052   MCHC 33.2 05/12/2022 0052   RDW 16.1 (H) 05/12/2022 0052   LYMPHSABS 2.1 05/12/2022 0052   MONOABS 0.7  05/12/2022 0052   EOSABS 0.1 05/12/2022 0052   BASOSABS 0.0 05/12/2022 0052    Lab Results  Component Value Date   HGBA1C 5.7 (H) 01/22/2022      05/14/2022    4:19 PM  Depression screen PHQ 2/9  Decreased Interest 2  Down, Depressed, Hopeless 2  PHQ - 2 Score 4  Altered sleeping 2  Tired, decreased energy 3  Change in appetite 3  Feeling bad or failure about yourself  2  Trouble concentrating 2  Moving slowly or fidgety/restless 3  Suicidal thoughts 0  PHQ-9 Score 19     Assessment & Plan:  1. Primary hypertension Controlled Soft blood pressure hence I have decreased dose of propanolol and will reassess at next visit Counseled on blood pressure goal of less than 130/80, low-sodium, DASH diet, medication compliance, 150 minutes of moderate intensity exercise per week. Discussed medication compliance, adverse effects. - amLODipine (NORVASC) 10 MG tablet; Take 1 tablet (10 mg total) by mouth daily.  Dispense: 30 tablet; Refill: 3 - hydrALAZINE (APRESOLINE) 50 MG tablet; Take 1 tablet (50 mg total) by mouth every 8 (eight) hours.  Dispense: 90 tablet; Refill: 3 - propranolol (INDERAL) 20 MG tablet; Take 1 tablet (20 mg total) by mouth 3 (three) times daily.  Dispense: 180 tablet; Refill: 0 - Basic Metabolic Panel  2. Antiphospholipid antibody with hypercoagulable state (HCC) Discovered during hospitalization in 01/2022 - Ambulatory referral to Hematology / Oncology  3. PEG  status (HCC) Patient pulled out PEG tube We will refer to interventional Per family, she is feeding orally and still might not be indicated - Ambulatory referral to Interventional Radiology  4. 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) Continue home PT Risk factor modification - atorvastatin (LIPITOR) 80 MG tablet; Take 1 tablet (80 mg total) by mouth daily.  Dispense: 30 tablet; Refill: 3 - gabapentin (NEURONTIN) 100  MG capsule; Take 1 capsule (100 mg total) by mouth 2 (two) times daily.  Dispense: 60  capsule; Refill: 3  5. 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  Dispense: 30 capsule; Refill: 3  6. Foley catheter in place She needs Foley catheter changed every month but apparently was not referred to urology when she was discharged in 03/2022 She did have an episode of cath associated UTI last month It does appear she is Medicaid pending and I have advised him to apply for the [facility] health financial discount while awaiting approval for Medicaid. - Ambulatory referral to Urology    Meds ordered this encounter  Medications   amLODipine (NORVASC) 10 MG tablet    Sig: Take 1 tablet (10 mg total) by mouth daily.    Dispense:  30 tablet    Refill:  3   atorvastatin (LIPITOR) 80 MG tablet    Sig: Take 1 tablet (80 mg total) by mouth daily.    Dispense:  30 tablet    Refill:  3   gabapentin (NEURONTIN) 100 MG capsule    Sig: Take 1 capsule (100 mg total) by mouth 2 (two) times daily.    Dispense:  60 capsule    Refill:  3   hydrALAZINE (APRESOLINE) 50 MG tablet    Sig: Take 1 tablet (50 mg total) by mouth every 8 (eight) hours.    Dispense:  90 tablet    Refill:  3   propranolol (INDERAL) 20 MG tablet    Sig: Take 1 tablet (20 mg total) by mouth 3 (three) times daily.    Dispense:  180 tablet    Refill:  0    Dose decrease   DULoxetine (CYMBALTA) 60 MG capsule    Sig: Take 1 capsule (60 mg total) by mouth daily.    Dispense:  30 capsule    Refill:  3     Follow-up: Return in about 6 weeks (around 06/25/2022) for Blood Pressure follow-up.       Hoy Register, MD, FAAFP. [facility] [place], [place] [phone]   05/15/2022, 11:34 AM

## 2022-05-15 NOTE — Telephone Encounter (Signed)
Is this request what was faxed 6 days ago?

## 2022-05-16 NOTE — Telephone Encounter (Signed)
All orders has been signed and faxed over.

## 2022-05-24 ENCOUNTER — Inpatient Hospital Stay: Payer: Medicaid Other | Attending: Hematology & Oncology

## 2022-05-24 ENCOUNTER — Inpatient Hospital Stay: Payer: Medicaid Other | Admitting: Family

## 2022-05-24 ENCOUNTER — Other Ambulatory Visit: Payer: Self-pay | Admitting: Family

## 2022-05-24 DIAGNOSIS — I639 Cerebral infarction, unspecified: Secondary | ICD-10-CM

## 2022-05-24 DIAGNOSIS — D6861 Antiphospholipid syndrome: Secondary | ICD-10-CM

## 2022-05-24 DIAGNOSIS — I63513 Cerebral infarction due to unspecified occlusion or stenosis of bilateral middle cerebral arteries: Secondary | ICD-10-CM

## 2022-05-25 ENCOUNTER — Emergency Department (HOSPITAL_COMMUNITY): Payer: No Typology Code available for payment source

## 2022-05-25 ENCOUNTER — Inpatient Hospital Stay (HOSPITAL_COMMUNITY)
Admission: EM | Admit: 2022-05-25 | Discharge: 2022-05-28 | DRG: 872 | Disposition: A | Discharge status: Home Health | Payer: No Typology Code available for payment source | Attending: Internal Medicine | Admitting: Internal Medicine

## 2022-05-25 ENCOUNTER — Encounter (HOSPITAL_COMMUNITY): Payer: Self-pay

## 2022-05-25 ENCOUNTER — Inpatient Hospital Stay (HOSPITAL_COMMUNITY): Payer: No Typology Code available for payment source

## 2022-05-25 DIAGNOSIS — A419 Sepsis, unspecified organism: Principal | ICD-10-CM | POA: Diagnosis present

## 2022-05-25 DIAGNOSIS — B370 Candidal stomatitis: Secondary | ICD-10-CM | POA: Diagnosis present

## 2022-05-25 DIAGNOSIS — E876 Hypokalemia: Secondary | ICD-10-CM | POA: Diagnosis present

## 2022-05-25 DIAGNOSIS — N390 Urinary tract infection, site not specified: Secondary | ICD-10-CM | POA: Diagnosis present

## 2022-05-25 DIAGNOSIS — Z7902 Long term (current) use of antithrombotics/antiplatelets: Secondary | ICD-10-CM

## 2022-05-25 DIAGNOSIS — D6861 Antiphospholipid syndrome: Secondary | ICD-10-CM | POA: Diagnosis present

## 2022-05-25 DIAGNOSIS — Z880 Allergy status to penicillin: Secondary | ICD-10-CM | POA: Diagnosis not present

## 2022-05-25 DIAGNOSIS — A4189 Other specified sepsis: Principal | ICD-10-CM | POA: Diagnosis present

## 2022-05-25 DIAGNOSIS — A4181 Sepsis due to Enterococcus: Secondary | ICD-10-CM | POA: Diagnosis present

## 2022-05-25 DIAGNOSIS — B964 Proteus (mirabilis) (morganii) as the cause of diseases classified elsewhere: Secondary | ICD-10-CM | POA: Diagnosis present

## 2022-05-25 DIAGNOSIS — N179 Acute kidney failure, unspecified: Secondary | ICD-10-CM | POA: Diagnosis not present

## 2022-05-25 DIAGNOSIS — I248 Other forms of acute ischemic heart disease: Secondary | ICD-10-CM | POA: Diagnosis present

## 2022-05-25 DIAGNOSIS — E872 Acidosis, unspecified: Secondary | ICD-10-CM | POA: Diagnosis present

## 2022-05-25 DIAGNOSIS — R000 Tachycardia, unspecified: Secondary | ICD-10-CM | POA: Diagnosis present

## 2022-05-25 DIAGNOSIS — Z79899 Other long term (current) drug therapy: Secondary | ICD-10-CM

## 2022-05-25 DIAGNOSIS — E870 Hyperosmolality and hypernatremia: Secondary | ICD-10-CM | POA: Diagnosis not present

## 2022-05-25 DIAGNOSIS — Z87898 Personal history of other specified conditions: Secondary | ICD-10-CM

## 2022-05-25 DIAGNOSIS — I10 Essential (primary) hypertension: Secondary | ICD-10-CM | POA: Diagnosis present

## 2022-05-25 DIAGNOSIS — E860 Dehydration: Secondary | ICD-10-CM | POA: Diagnosis present

## 2022-05-25 DIAGNOSIS — Z885 Allergy status to narcotic agent status: Secondary | ICD-10-CM | POA: Diagnosis not present

## 2022-05-25 DIAGNOSIS — D696 Thrombocytopenia, unspecified: Secondary | ICD-10-CM | POA: Diagnosis present

## 2022-05-25 DIAGNOSIS — R778 Other specified abnormalities of plasma proteins: Secondary | ICD-10-CM | POA: Diagnosis not present

## 2022-05-25 DIAGNOSIS — Z20822 Contact with and (suspected) exposure to covid-19: Secondary | ICD-10-CM | POA: Diagnosis present

## 2022-05-25 DIAGNOSIS — Z833 Family history of diabetes mellitus: Secondary | ICD-10-CM

## 2022-05-25 DIAGNOSIS — Z886 Allergy status to analgesic agent status: Secondary | ICD-10-CM | POA: Diagnosis not present

## 2022-05-25 DIAGNOSIS — Z8673 Personal history of transient ischemic attack (TIA), and cerebral infarction without residual deficits: Secondary | ICD-10-CM

## 2022-05-25 DIAGNOSIS — R6520 Severe sepsis without septic shock: Secondary | ICD-10-CM | POA: Diagnosis not present

## 2022-05-25 LAB — CBC WITH DIFFERENTIAL/PLATELET
Abs Immature Granulocytes: 0.04 10*3/uL (ref 0.00–0.07)
Basophils Absolute: 0 10*3/uL (ref 0.0–0.1)
Basophils Relative: 0 %
Eosinophils Absolute: 0 10*3/uL (ref 0.0–0.5)
Eosinophils Relative: 0 %
HCT: 50.5 % — ABNORMAL HIGH (ref 36.0–46.0)
Hemoglobin: 16.2 g/dL — ABNORMAL HIGH (ref 12.0–15.0)
Immature Granulocytes: 0 %
Lymphocytes Relative: 23 %
Lymphs Abs: 3.1 10*3/uL (ref 0.7–4.0)
MCH: 26.6 pg (ref 26.0–34.0)
MCHC: 32.1 g/dL (ref 30.0–36.0)
MCV: 82.9 fL (ref 80.0–100.0)
Monocytes Absolute: 0.6 10*3/uL (ref 0.1–1.0)
Monocytes Relative: 5 %
Neutro Abs: 9.3 10*3/uL — ABNORMAL HIGH (ref 1.7–7.7)
Neutrophils Relative %: 72 %
Platelets: 230 10*3/uL (ref 150–400)
RBC: 6.09 MIL/uL — ABNORMAL HIGH (ref 3.87–5.11)
RDW: 19 % — ABNORMAL HIGH (ref 11.5–15.5)
WBC: 13.1 10*3/uL — ABNORMAL HIGH (ref 4.0–10.5)
nRBC: 0 % (ref 0.0–0.2)

## 2022-05-25 LAB — URINALYSIS, ROUTINE W REFLEX MICROSCOPIC
Bilirubin Urine: NEGATIVE
Glucose, UA: NEGATIVE mg/dL
Ketones, ur: 5 mg/dL — AB
Nitrite: NEGATIVE
Protein, ur: 100 mg/dL — AB
Specific Gravity, Urine: 1.014 (ref 1.005–1.030)
WBC, UA: >50 WBC/hpf — ABNORMAL HIGH (ref 0–5)
pH: 8 (ref 5.0–8.0)

## 2022-05-25 LAB — COMPREHENSIVE METABOLIC PANEL
ALT: 18 U/L (ref 0–44)
AST: 25 U/L (ref 15–41)
Albumin: 4.7 g/dL (ref 3.5–5.0)
Alkaline Phosphatase: 98 U/L (ref 38–126)
Anion gap: 14 (ref 5–15)
BUN: 52 mg/dL — ABNORMAL HIGH (ref 6–20)
CO2: 24 mmol/L (ref 22–32)
Calcium: 10.3 mg/dL (ref 8.9–10.3)
Chloride: 121 mmol/L — ABNORMAL HIGH (ref 98–111)
Creatinine, Ser: 3.09 mg/dL — ABNORMAL HIGH (ref 0.44–1.00)
GFR, Estimated: 18 mL/min — ABNORMAL LOW (ref >60)
Glucose, Bld: 130 mg/dL — ABNORMAL HIGH (ref 70–99)
Potassium: 4.2 mmol/L (ref 3.5–5.1)
Sodium: 159 mmol/L — ABNORMAL HIGH (ref 135–145)
Total Bilirubin: 1.2 mg/dL (ref 0.3–1.2)
Total Protein: 9.7 g/dL — ABNORMAL HIGH (ref 6.5–8.1)

## 2022-05-25 LAB — RESP PANEL BY RT-PCR (FLU A&B, COVID) ARPGX2
Influenza A by PCR: NEGATIVE
Influenza B by PCR: NEGATIVE
SARS Coronavirus 2 by RT PCR: NEGATIVE

## 2022-05-25 LAB — TROPONIN I (HIGH SENSITIVITY): Troponin I (High Sensitivity): 269 ng/L (ref <18)

## 2022-05-25 LAB — LACTIC ACID, PLASMA
Lactic Acid, Venous: 2.3 mmol/L (ref 0.5–1.9)
Lactic Acid, Venous: 2.6 mmol/L (ref 0.5–1.9)

## 2022-05-25 LAB — LIPASE, BLOOD: Lipase: 44 U/L (ref 11–51)

## 2022-05-25 MED ORDER — ATORVASTATIN CALCIUM 80 MG PO TABS
80.0000 mg | ORAL_TABLET | Freq: Every day | ORAL | Status: DC
  Administered 2022-05-26 – 2022-05-28 (×3): 80 mg via ORAL
  Filled 2022-05-25: qty 1
  Filled 2022-05-25: qty 2
  Filled 2022-05-25: qty 1

## 2022-05-25 MED ORDER — FOLIC ACID 1 MG PO TABS
1.0000 mg | ORAL_TABLET | Freq: Every day | ORAL | Status: DC
  Administered 2022-05-26 – 2022-05-28 (×3): 1 mg via ORAL
  Filled 2022-05-25 (×3): qty 1

## 2022-05-25 MED ORDER — LACTATED RINGERS IV BOLUS (SEPSIS)
500.0000 mL | Freq: Once | INTRAVENOUS | Status: AC
Start: 2022-05-25 — End: 2022-05-25
  Administered 2022-05-25: 500 mL via INTRAVENOUS

## 2022-05-25 MED ORDER — DULOXETINE HCL 60 MG PO CPEP
60.0000 mg | ORAL_CAPSULE | Freq: Every day | ORAL | Status: DC
  Administered 2022-05-26 – 2022-05-28 (×3): 60 mg via ORAL
  Filled 2022-05-25: qty 1
  Filled 2022-05-25: qty 2
  Filled 2022-05-25: qty 1

## 2022-05-25 MED ORDER — LACTATED RINGERS IV SOLN: INTRAVENOUS | Status: DC

## 2022-05-25 MED ORDER — SODIUM CHLORIDE 0.9 % IV SOLN
2.0000 g | Freq: Once | INTRAVENOUS | Status: AC
  Administered 2022-05-25: 2 g via INTRAVENOUS
  Filled 2022-05-25: qty 12.5

## 2022-05-25 MED ORDER — CLOPIDOGREL BISULFATE 75 MG PO TABS
75.0000 mg | ORAL_TABLET | Freq: Every day | ORAL | Status: DC
  Administered 2022-05-27 – 2022-05-28 (×2): 75 mg via ORAL
  Filled 2022-05-25 (×2): qty 1

## 2022-05-25 MED ORDER — PROPRANOLOL HCL 20 MG PO TABS
20.0000 mg | ORAL_TABLET | Freq: Three times a day (TID) | ORAL | Status: DC
  Administered 2022-05-26: 20 mg via ORAL
  Filled 2022-05-25 (×3): qty 1

## 2022-05-25 MED ORDER — LACTATED RINGERS IV BOLUS (SEPSIS)
1000.0000 mL | Freq: Once | INTRAVENOUS | Status: AC
  Administered 2022-05-25: 1000 mL via INTRAVENOUS

## 2022-05-25 MED ORDER — ACETAMINOPHEN 325 MG PO TABS: 650.0000 mg | ORAL_TABLET | Freq: Four times a day (QID) | ORAL | Status: DC | PRN

## 2022-05-25 MED ORDER — SODIUM CHLORIDE 0.9 % IV SOLN: 2.0000 g | INTRAVENOUS | Status: DC

## 2022-05-25 MED ORDER — LACTATED RINGERS IV BOLUS
1000.0000 mL | Freq: Once | INTRAVENOUS | Status: AC
  Administered 2022-05-25: 1000 mL via INTRAVENOUS

## 2022-05-25 MED ORDER — VANCOMYCIN HCL 1500 MG/300ML IV SOLN
1500.0000 mg | Freq: Once | INTRAVENOUS | Status: AC
  Administered 2022-05-25: 1500 mg via INTRAVENOUS
  Filled 2022-05-25: qty 300

## 2022-05-25 MED ORDER — DEXTROSE IN LACTATED RINGERS 5 % IV SOLN: INTRAVENOUS | Status: DC

## 2022-05-25 MED ORDER — VANCOMYCIN HCL 1500 MG/300ML IV SOLN
1500.0000 mg | Freq: Once | INTRAVENOUS | Status: DC
  Filled 2022-05-25: qty 300

## 2022-05-25 MED ORDER — METRONIDAZOLE 500 MG/100ML IV SOLN
500.0000 mg | Freq: Once | INTRAVENOUS | Status: AC
Start: 2022-05-25 — End: 2022-05-25
  Administered 2022-05-25: 500 mg via INTRAVENOUS
  Filled 2022-05-25: qty 100

## 2022-05-25 MED ORDER — HEPARIN SODIUM (PORCINE) 5000 UNIT/ML IJ SOLN
5000.0000 [IU] | Freq: Three times a day (TID) | INTRAMUSCULAR | Status: DC
  Administered 2022-05-26 – 2022-05-28 (×6): 5000 [IU] via SUBCUTANEOUS
  Filled 2022-05-25 (×4): qty 1

## 2022-05-25 MED ORDER — AMANTADINE HCL 100 MG PO CAPS
100.0000 mg | ORAL_CAPSULE | Freq: Two times a day (BID) | ORAL | Status: DC
  Administered 2022-05-26 – 2022-05-28 (×5): 100 mg via ORAL
  Filled 2022-05-25 (×6): qty 1

## 2022-05-25 MED ORDER — ACETAMINOPHEN 650 MG RE SUPP: 650.0000 mg | Freq: Four times a day (QID) | RECTAL | Status: DC | PRN

## 2022-05-25 MED ORDER — SODIUM CHLORIDE 0.9 % IV SOLN: 2.0000 g | Freq: Once | INTRAVENOUS | Status: DC

## 2022-05-25 MED ORDER — LACTATED RINGERS IV BOLUS (SEPSIS)
1000.0000 mL | Freq: Once | INTRAVENOUS | Status: AC
Start: 2022-05-25 — End: 2022-05-25
  Administered 2022-05-25: 1000 mL via INTRAVENOUS

## 2022-05-25 MED ORDER — HYDRALAZINE HCL 25 MG PO TABS
50.0000 mg | ORAL_TABLET | Freq: Three times a day (TID) | ORAL | Status: DC
  Filled 2022-05-25: qty 2

## 2022-05-25 MED ORDER — VANCOMYCIN VARIABLE DOSE PER UNSTABLE RENAL FUNCTION (PHARMACIST DOSING): Status: DC

## 2022-05-25 MED ORDER — AMLODIPINE BESYLATE 5 MG PO TABS
10.0000 mg | ORAL_TABLET | Freq: Every day | ORAL | Status: DC
  Administered 2022-05-26: 10 mg via ORAL
  Filled 2022-05-25: qty 2

## 2022-05-25 MED ORDER — VANCOMYCIN HCL IN DEXTROSE 1-5 GM/200ML-% IV SOLN: 1000.0000 mg | Freq: Once | INTRAVENOUS | Status: DC

## 2022-05-25 NOTE — ED Notes (Signed)
Attempted to get IV and draw blood, attempt unsuccessful. MD notified IV team consult ordered.

## 2022-05-25 NOTE — ED Triage Notes (Signed)
Pt coming from home by [facility]. Pt lives with daughter. Daughter called due to pts catheter leaking. Pt has hx of stroke and has weakness in her right arm as a result.    BP 10 palp CBG 174

## 2022-05-25 NOTE — ED Notes (Signed)
This RN noted 75ml output in foley bag after 3L fluid. This RN attempted to irrigate foley, unsuccessful, dislodgement/obstruction suspected. Pt bladder scanned, >129ml seen. Verbal order to replace foley by Deretha Emory MD

## 2022-05-25 NOTE — ED Notes (Signed)
Zackowski MD made aware of troponin 269 and lactic 2.3

## 2022-05-25 NOTE — ED Notes (Signed)
Pt minimally responsive, opens eyes spontaneously, nonverbal, follows some commands

## 2022-05-25 NOTE — Sepsis Progress Note (Signed)
Sepsis protocol is being followed by [facility]. 

## 2022-05-25 NOTE — ED Notes (Signed)
Phlebotomy unable to draw labs.

## 2022-05-25 NOTE — Progress Notes (Signed)
Pharmacy Antibiotic Note  Cindy Brooks is a 51 y.o. female for which pharmacy has been consulted for vancomycin dosing for sepsis.  Patient with a history of stroke and is bedbound. Patient presenting for concern for drainage around foley and possible UTI.  SCr 3.09 - 0.8 baseline WBC 13.1; LA 2.3; T 98.2 F; HR 116; RR 23 COVID/Flu - neg  Plan: Metronidazole per MD Cefepime 2g q24hr Vancomycin 1500 mg once, subsequent dosing as indicated per random vancomycin level until renal function stable and/or improved, at which time scheduled dosing can be considered Trend WBC, Fever, Renal function, & Clinical course F/u cultures, clinical course, WBC, fever De-escalate when able Levels at steady state     Temp (24hrs), Avg:98.2 F (36.8 C), Min:98.2 F (36.8 C), Max:98.2 F (36.8 C)  No results for input(s): "WBC", "CREATININE", "LATICACIDVEN", "VANCOTROUGH", "VANCOPEAK", "VANCORANDOM", "GENTTROUGH", "GENTPEAK", "GENTRANDOM", "TOBRATROUGH", "TOBRAPEAK", "TOBRARND", "AMIKACINPEAK", "AMIKACINTROU", "AMIKACIN" in the last 168 hours.  CrCl cannot be calculated (Unknown ideal weight.).    Allergies  Allergen Reactions   Aspirin Hives and Rash   Codeine Hives and Rash   Penicillins Anaphylaxis, Swelling and Other (See Comments)   Strawberry Extract Anaphylaxis and Hives    Antimicrobials this admission: vancomycin 8/12 >>  cefepime 8/12 >>  flagyl 8/12 >>   Microbiology results: Pending  Thank you for allowing pharmacy to be a part of this patient's care.  Delmar Landau, PharmD, BCPS 05/25/2022 6:39 PM ED Clinical Pharmacist -  [phone]

## 2022-05-25 NOTE — ED Provider Notes (Signed)
[facility] EMERGENCY DEPARTMENT Provider Note   CSN: [id number] Arrival date & time: 05/25/22  1252     History  No chief complaint on file.   Cindy Brooks is a 51 y.o. female.  HPI Patient has history of stroke and is bedbound.  She has limited baseline activity.  His daughter reported the patient can respond yes or no sometimes.  Patient can be transition from the bed to a recliner chair.  Patient's daughter reports concern regarding the patient's Foley catheter.  She is concerned for possible infection or drainage around the catheter.  She also expressed concern for possible oral thrush.  She has been trying to use hydrogen peroxide to clean the patient's mouth but notes that she still has a lot of white stuff on her tongue.    Home Medications Prior to Admission medications   Medication Sig Start Date End Date Taking? Authorizing Provider  acetaminophen (TYLENOL) 325 MG tablet Take 2 tablets (650 mg total) by mouth every 4 (four) hours as needed. Patient not taking: Reported on 04/20/2022 03/21/22 03/21/23  Almon Hercules, MD  amantadine (SYMMETREL) 100 MG capsule Take 1 capsule (100 mg total) by mouth 2 (two) times daily. 03/21/22 05/20/22  Almon Hercules, MD  amLODipine (NORVASC) 10 MG tablet Take 1 tablet (10 mg total) by mouth daily. 05/14/22   Hoy Register, MD  atorvastatin (LIPITOR) 80 MG tablet Take 1 tablet (80 mg total) by mouth daily. 05/14/22   Hoy Register, MD  clopidogrel (PLAVIX) 75 MG tablet Take 1 tablet (75 mg total) by mouth daily. 03/21/22   Almon Hercules, MD  DULoxetine (CYMBALTA) 60 MG capsule Take 1 capsule (60 mg total) by mouth daily. 05/14/22   Hoy Register, MD  folic acid (FOLVITE) 1 MG tablet Take 1 tablet (1 mg total) by mouth daily. 03/21/22   Almon Hercules, MD  gabapentin (NEURONTIN) 100 MG capsule Take 1 capsule (100 mg total) by mouth 2 (two) times daily. 05/14/22   Hoy Register, MD  glycopyrrolate (ROBINUL) 1 MG tablet Take 0.5 tablets (0.5  mg total) by mouth 2 (two) times daily. Patient not taking: Reported on 05/14/2022 03/21/22   Almon Hercules, MD  hydrALAZINE (APRESOLINE) 50 MG tablet Take 1 tablet (50 mg total) by mouth every 8 (eight) hours. 05/14/22   Hoy Register, MD  ipratropium-albuterol (DUONEB) 0.5-2.5 (3) MG/3ML SOLN Take 3 mLs by nebulization every 6 (six) hours as needed. Patient taking differently: Take 3 mLs by nebulization every 6 (six) hours as needed (shortness of breath, wheezing). 03/21/22   Almon Hercules, MD  Multiple Vitamin (MULTIVITAMIN WITH MINERALS) TABS tablet Take 1 tablet by mouth daily. Patient not taking: Reported on 04/20/2022 03/21/22   Almon Hercules, MD  Nystatin (GERHARDT'S BUTT CREAM) CREA Apply 1 application. topically daily as needed for irritation. 03/21/22   Almon Hercules, MD  polyethylene glycol powder (MIRALAX) 17 GM/SCOOP powder Take 17 g by mouth 2 (two) times daily as needed for moderate constipation. Patient not taking: Reported on 05/14/2022 03/21/22   Almon Hercules, MD  propranolol (INDERAL) 20 MG tablet Take 1 tablet (20 mg total) by mouth 3 (three) times daily. 05/14/22 07/13/22  Hoy Register, MD  senna-docusate (SENOKOT-S) 8.6-50 MG tablet Take 1 tablet by mouth 2 (two) times daily between meals as needed for mild constipation. Patient not taking: Reported on 04/20/2022 03/21/22   Almon Hercules, MD  thiamine 100 MG tablet Take 1 tablet (100 mg  total) by mouth daily. Patient not taking: Reported on 05/14/2022 03/21/22   Almon Hercules, MD      Allergies    Aspirin, Codeine, Penicillins, and Strawberry extract    Review of Systems   Review of Systems Level 5 caveat cannot obtain review of systems due to patient condition. Physical Exam Updated Vital Signs BP 112/89   Pulse (!) 120   Temp 98.2 F (36.8 C)   Resp (!) 26   LMP  (LMP Unknown)   SpO2 98%  Physical Exam Constitutional:      Comments: Patient is supine on the bed.  She is deconditioned.  Mildly tachypneic but not respiratory  distress.   HENT:     Head: Normocephalic and atraumatic.     Mouth/Throat:     Comments: Mouth has fairly thick whitish plaque on the tongue and the mucosa.  Dentition poor condition with gingivitis. Eyes:     Extraocular Movements: Extraocular movements intact.  Cardiovascular:     Comments: Tachycardia.  No gross rub murmur gallop Pulmonary:     Comments: Has some tachypnea but no gross rhonchi or wheeze. Abdominal:     Comments: Abdomen is soft.  No guarding.  She has a well-healed site where she previously had a feeding tube  Genitourinary:    Comments: Vaginal introitus examined.  Foley catheter is in place without significant surrounding discharge.  The catheter output has a moderately thick flocculent appearance.  Not bloody and no clots. Musculoskeletal:     Comments: Patient's extremities are showing signs of atrophy.  She does not seem to build to move her extremities at all.  No significant peripheral edema.  Skin:    General: Skin is warm and dry.  Neurological:     Comments: Patient does not make any verbal response.  I speak to her she does have eye opening and does seem to gaze at me, possibly with comprehension.  She does not appear to be able to move any of her extremities.     ED Results / Procedures / Treatments   Labs (all labs ordered are listed, but only abnormal results are displayed) Labs Reviewed  RESP PANEL BY RT-PCR (FLU A&B, COVID) [id number]  URINE CULTURE  CULTURE, BLOOD (ROUTINE X 2)  CULTURE, BLOOD (ROUTINE X 2)  COMPREHENSIVE METABOLIC PANEL  LACTIC ACID, PLASMA  LACTIC ACID, PLASMA  LIPASE, BLOOD  CBC WITH DIFFERENTIAL/PLATELET  URINALYSIS, ROUTINE W REFLEX MICROSCOPIC  I-STAT CHEM 8, ED  TROPONIN I (HIGH SENSITIVITY)  TROPONIN I (HIGH SENSITIVITY)    EKG EKG Interpretation  Date/Time:  Saturday May 25 2022 16:26:02 EDT Ventricular Rate:  119 PR Interval:  129 QRS Duration: 89 QT Interval:  327 QTC Calculation: 461 R  Axis:   -28 Text Interpretation: Sinus tachycardia Right atrial enlargement Anterolateral infarct, age indeterminate Confirmed by Vanetta Mulders [phone]) on 05/25/2022 5:31:58 PM  Radiology DG Chest Port 1 View  Result Date: 05/25/2022 CLINICAL DATA:  Concern for sepsis. EXAM: PORTABLE CHEST 1 VIEW COMPARISON:  Chest radiograph May 12, 2022 FINDINGS: The heart size and mediastinal contours are within normal limits. No focal airspace consolidation. No visible pleural effusion or pneumothorax. Degenerative changes bilateral shoulders. IMPRESSION: No acute cardiopulmonary disease. Electronically Signed   By: Maudry Mayhew M.D.   On: 05/25/2022 16:18    Procedures Procedures   Bedside ultrasound: I used bedside ultrasound in the suprapubic area to confirm Foley catheter placement.  The balloon is visible and inflated within the bladder.  Small amount of fluid in the bladder.  Bladder is not distended. Medications Ordered in ED Medications  lactated ringers bolus 1,000 mL (has no administration in time range)    ED Course/ Medical Decision Making/ A&P                           Medical Decision Making Amount and/or Complexity of Data Reviewed Labs: ordered. Radiology: ordered.   Patient has severe comorbid condition of bedbound and nonverbal post stroke.  Diagnostic evaluation and work-up based on daughter's report of concern for drainage around Foley catheter and possible UTI.  Patient is significantly tachycardic in the 120s.  She is tachypneic.  She does not exhibit respiratory distress.  Patient has prior history of admission for sepsis.  Foley catheter placement and bladder decompression are confirmed at bedside by myself by ultrasound.  At this time concern for systemic inflammatory response with tachycardia and tachypnea.  We will proceed with diagnostic evaluation for rule out sepsis.  Lab work pending at time of shift change.  Dr. Deretha Emory to follow-up on diagnostic results for  final disposition.        Final Clinical Impression(s) / ED Diagnoses Final diagnoses:  None    Rx / DC Orders ED Discharge Orders     None         Arby Barrette, MD 05/25/22 [phone]

## 2022-05-25 NOTE — H&P (Signed)
History and Physical    Cindy Brooks NTI:[id number] DOB: Nov 30, 1970 DOA: 05/25/2022  PCP: Claiborne Rigg, NP  Patient coming from: Home.  History obtained from patient's daughter present as patient is less communicative after the stroke.  Chief Complaint: Leaking Foley catheter.  HPI: Cindy Brooks is a 51 y.o. female with history of stroke largely bedbound and minimally communicative, hypertension, chronic indwelling Foley catheter prior history of use of PEG tube which has been off for the last 2 months was brought to the ER after patient's daughter noticed that patient Foley catheter was leaking and also the patient has developed oral thrush.  Per patient's daughter patient is able to eat orally with help.  ED Course: In the ER patient had her Foley catheter replaced and also was found to be clogged.  Labs show elevated sodium level and acute renal failure when compared to the labs done about 2 weeks ago.  Patient also has oral thrush.  Patient was tachycardic with elevated WBC count and lactic acid levels.  Patient was started on fluid bolus for possible sepsis source could be UTI.  Review of Systems: As per HPI, rest all negative.   Past Medical History:  Diagnosis Date   Hypertension    Hypertensive urgency 01/20/2022   Seizure (HCC) 01/28/2022   Stroke [facility])    TIA (transient ischemic attack) 2019   Urinary tract infection 01/28/2022    Past Surgical History:  Procedure Laterality Date   ESOPHAGOGASTRODUODENOSCOPY N/A 01/28/2022   Procedure: ESOPHAGOGASTRODUODENOSCOPY (EGD);  Surgeon: Diamantina Monks, MD;  Location: [facility] ENDOSCOPY;  Service: General;  Laterality: N/A;   PEG PLACEMENT N/A 01/28/2022   Procedure: PERCUTANEOUS ENDOSCOPIC GASTROSTOMY (PEG) PLACEMENT;  Surgeon: Diamantina Monks, MD;  Location: [facility] ENDOSCOPY;  Service: General;  Laterality: N/A;     reports that she has never smoked. She has never used smokeless tobacco. She reports current alcohol use of about  203.0 standard drinks of alcohol per week. She reports that she does not use drugs.  Allergies  Allergen Reactions   Aspirin Hives and Rash   Codeine Hives and Rash   Penicillins Anaphylaxis, Swelling and Other (See Comments)   Strawberry Extract Anaphylaxis and Hives    Family History  Problem Relation Age of Onset   Diabetes Mellitus II Maternal Grandmother     Prior to Admission medications   Medication Sig Start Date End Date Taking? Authorizing Provider  acetaminophen (TYLENOL) 325 MG tablet Take 2 tablets (650 mg total) by mouth every 4 (four) hours as needed. Patient not taking: Reported on 04/20/2022 03/21/22 03/21/23  Almon Hercules, MD  amantadine (SYMMETREL) 100 MG capsule Take 1 capsule (100 mg total) by mouth 2 (two) times daily. 03/21/22 05/20/22  Almon Hercules, MD  amLODipine (NORVASC) 10 MG tablet Take 1 tablet (10 mg total) by mouth daily. 05/14/22   Hoy Register, MD  atorvastatin (LIPITOR) 80 MG tablet Take 1 tablet (80 mg total) by mouth daily. 05/14/22   Hoy Register, MD  clopidogrel (PLAVIX) 75 MG tablet Take 1 tablet (75 mg total) by mouth daily. 03/21/22   Almon Hercules, MD  DULoxetine (CYMBALTA) 60 MG capsule Take 1 capsule (60 mg total) by mouth daily. 05/14/22   Hoy Register, MD  folic acid (FOLVITE) 1 MG tablet Take 1 tablet (1 mg total) by mouth daily. 03/21/22   Almon Hercules, MD  gabapentin (NEURONTIN) 100 MG capsule Take 1 capsule (100 mg total) by mouth 2 (two) times daily. 05/14/22  Hoy Register, MD  glycopyrrolate (ROBINUL) 1 MG tablet Take 0.5 tablets (0.5 mg total) by mouth 2 (two) times daily. Patient not taking: Reported on 05/14/2022 03/21/22   Almon Hercules, MD  hydrALAZINE (APRESOLINE) 50 MG tablet Take 1 tablet (50 mg total) by mouth every 8 (eight) hours. 05/14/22   Hoy Register, MD  ipratropium-albuterol (DUONEB) 0.5-2.5 (3) MG/3ML SOLN Take 3 mLs by nebulization every 6 (six) hours as needed. Patient taking differently: Take 3 mLs by nebulization every 6  (six) hours as needed (shortness of breath, wheezing). 03/21/22   Almon Hercules, MD  Multiple Vitamin (MULTIVITAMIN WITH MINERALS) TABS tablet Take 1 tablet by mouth daily. Patient not taking: Reported on 04/20/2022 03/21/22   Almon Hercules, MD  Nystatin (GERHARDT'S BUTT CREAM) CREA Apply 1 application. topically daily as needed for irritation. 03/21/22   Almon Hercules, MD  polyethylene glycol powder (MIRALAX) 17 GM/SCOOP powder Take 17 g by mouth 2 (two) times daily as needed for moderate constipation. Patient not taking: Reported on 05/14/2022 03/21/22   Almon Hercules, MD  propranolol (INDERAL) 20 MG tablet Take 1 tablet (20 mg total) by mouth 3 (three) times daily. 05/14/22 07/13/22  Hoy Register, MD  senna-docusate (SENOKOT-S) 8.6-50 MG tablet Take 1 tablet by mouth 2 (two) times daily between meals as needed for mild constipation. Patient not taking: Reported on 04/20/2022 03/21/22   Almon Hercules, MD  thiamine 100 MG tablet Take 1 tablet (100 mg total) by mouth daily. Patient not taking: Reported on 05/14/2022 03/21/22   Almon Hercules, MD    Physical Exam: Constitutional: Moderately built and nourished. Vitals:   05/25/22 1728 05/25/22 1859 05/25/22 1900 05/25/22 2115  BP:   (!) 118/102 123/85  Pulse:   (!) 116 (!) 105  Resp:   (!) 28 (!) 22  Temp: 98.2 F (36.8 C) 98 F (36.7 C)    TempSrc:  Axillary    SpO2:   98% 100%   Eyes: Anicteric no pallor. ENMT: Oral thrush. Neck: No mass felt.  No neck rigidity. Respiratory: No rhonchi or crepitations. Cardiovascular: S1-S2 heard. Abdomen: Nontender bowel sound present. Musculoskeletal: No edema. Skin: No rash. Neurologic: Alert awake but less communicative. Psychiatric: Alert and awake.   Labs on Admission: I have personally reviewed following labs and imaging studies  CBC: Recent Labs  Lab 05/25/22 1820  WBC 13.1*  NEUTROABS 9.3*  HGB 16.2*  HCT 50.5*  MCV 82.9  PLT 230   Basic Metabolic Panel: Recent Labs  Lab 05/25/22 1820  NA  159*  K 4.2  CL 121*  CO2 24  GLUCOSE 130*  BUN 52*  CREATININE 3.09*  CALCIUM 10.3   GFR: CrCl cannot be calculated (Unknown ideal weight.). Liver Function Tests: Recent Labs  Lab 05/25/22 1820  AST 25  ALT 18  ALKPHOS 98  BILITOT 1.2  PROT 9.7*  ALBUMIN 4.7   Recent Labs  Lab 05/25/22 1820  LIPASE 44   No results for input(s): "AMMONIA" in the last 168 hours. Coagulation Profile: No results for input(s): "INR", "PROTIME" in the last 168 hours. Cardiac Enzymes: No results for input(s): "CKTOTAL", "CKMB", "CKMBINDEX", "TROPONINI" in the last 168 hours. BNP (last 3 results) No results for input(s): "PROBNP" in the last 8760 hours. HbA1C: No results for input(s): "HGBA1C" in the last 72 hours. CBG: No results for input(s): "GLUCAP" in the last 168 hours. Lipid Profile: No results for input(s): "CHOL", "HDL", "LDLCALC", "TRIG", "CHOLHDL", "LDLDIRECT" in the  last 72 hours. Thyroid Function Tests: No results for input(s): "TSH", "T4TOTAL", "FREET4", "T3FREE", "THYROIDAB" in the last 72 hours. Anemia Panel: No results for input(s): "VITAMINB12", "FOLATE", "FERRITIN", "TIBC", "IRON", "RETICCTPCT" in the last 72 hours. Urine analysis:    Component Value Date/Time   COLORURINE AMBER (A) 05/25/2022 2155   APPEARANCEUR TURBID (A) 05/25/2022 2155   LABSPEC 1.014 05/25/2022 2155   PHURINE 8.0 05/25/2022 2155   GLUCOSEU NEGATIVE 05/25/2022 2155   HGBUR SMALL (A) 05/25/2022 2155   BILIRUBINUR NEGATIVE 05/25/2022 2155   KETONESUR 5 (A) 05/25/2022 2155   PROTEINUR 100 (A) 05/25/2022 2155   NITRITE NEGATIVE 05/25/2022 2155   LEUKOCYTESUR LARGE (A) 05/25/2022 2155   Sepsis Labs: @LABRCNTIP (procalcitonin:4,lacticidven:4) ) Recent Results (from the past 240 hour(s))  Resp Panel by RT-PCR (Flu A&B, Covid) Anterior Nasal Swab     Status: None   Collection Time: 05/25/22  5:51 PM   Specimen: Anterior Nasal Swab  Result Value Ref Range Status   SARS Coronavirus 2 by RT PCR  NEGATIVE NEGATIVE Final    Comment: (NOTE) SARS-CoV-2 target nucleic acids are NOT DETECTED.  The SARS-CoV-2 RNA is generally detectable in upper respiratory specimens during the acute phase of infection. The lowest concentration of SARS-CoV-2 viral copies this assay can detect is 138 copies/mL. A negative result does not preclude SARS-Cov-2 infection and should not be used as the sole basis for treatment or other patient management decisions. A negative result may occur with  improper specimen collection/handling, submission of specimen other than nasopharyngeal swab, presence of viral mutation(s) within the areas targeted by this assay, and inadequate number of viral copies(<138 copies/mL). A negative result must be combined with clinical observations, patient history, and epidemiological information. The expected result is Negative.  Fact Sheet for Patients:  07/25/22  Fact Sheet for Healthcare Providers:  BloggerCourse.com  This test is no t yet approved or cleared by the [employer] FDA and  has been authorized for detection and/or diagnosis of SARS-CoV-2 by FDA under an Emergency Use Authorization (EUA). This EUA will remain  in effect (meaning this test can be used) for the duration of the COVID-19 declaration under Section 564(b)(1) of the Act, 21 U.S.C.section 360bbb-3(b)(1), unless the authorization is terminated  or revoked sooner.       Influenza A by PCR NEGATIVE NEGATIVE Final   Influenza B by PCR NEGATIVE NEGATIVE Final    Comment: (NOTE) The Xpert Xpress SARS-CoV-2/FLU/RSV plus assay is intended as an aid in the diagnosis of influenza from Nasopharyngeal swab specimens and should not be used as a sole basis for treatment. Nasal washings and aspirates are unacceptable for Xpert Xpress SARS-CoV-2/FLU/RSV testing.  Fact Sheet for Patients: [place]  Fact Sheet for  Healthcare Providers: BloggerCourse.com  This test is not yet approved or cleared by the [employer].[place] FDA and has been authorized for detection and/or diagnosis of SARS-CoV-2 by FDA under an Emergency Use Authorization (EUA). This EUA will remain in effect (meaning this test can be used) for the duration of the COVID-19 declaration under Section 564(b)(1) of the Act, 21 U.S.C. section 360bbb-3(b)(1), unless the authorization is terminated or revoked.  Performed at [facility], [address], [place], [place]      Radiological Exams on Admission: DG Chest Port 1 View  Result Date: 05/25/2022 CLINICAL DATA:  Concern for sepsis. EXAM: PORTABLE CHEST 1 VIEW COMPARISON:  Chest radiograph May 12, 2022 FINDINGS: The heart size and mediastinal contours are within normal limits. No  focal airspace consolidation. No visible pleural effusion or pneumothorax. Degenerative changes bilateral shoulders. IMPRESSION: No acute cardiopulmonary disease. Electronically Signed   By: Maudry Mayhew M.D.   On: 05/25/2022 16:18    EKG: Independently reviewed.  Tachycardia.  Assessment/Plan Principal Problem:   Sepsis (HCC) Active Problems:   Hypernatremia   Antiphospholipid antibody with hypercoagulable state (HCC)   HTN (hypertension)   AKI (acute kidney injury) (HCC)   ARF (acute renal failure) (HCC)    Possible developing sepsis likely source could be UTI -patient has chronic indwelling catheter.  Which was exchanged in the ER.  Continue with empiric antibiotics continue with hydration follow cultures. Acute renal failure with hypernatremia likely from dehydration.  Will need to evaluate patient's oral intake with calorie count and for now we will keep patient on D5W closely follow metabolic panel intake output and daily weights I have ordered a CT renal study to make sure there is no obstruction. Elevated troponin -could be from sepsis.  Will trend cardiac  markers which showing decreasing trend at this time.  Continue statins beta-blockers and Plavix.  Check 2D echo. Oral thrush for which I discussed with pharmacy and we will place patient on IV fluconazole. Hypertension -on propanolol hydralazine and amlodipine which was confirmed with patient's daughter. History of stroke on Plavix and statins. Antiphospholipid antibody with hypercoagulable state presently on Plavix.   Since patient has acute renal failure with possible sepsis elevated troponin will need close monitoring and inpatient status.   DVT prophylaxis: Heparin. Code Status: Full code. Family Communication: Patient's daughter. Disposition Plan: To be determined. Consults called: Nutrition consult. Admission status: Inpatient.   Eduard Clos MD [facility] Pager [phone].  If 7PM-7AM, please contact night-coverage [email or web address] Password [facility]  05/25/2022, 11:06 PM

## 2022-05-25 NOTE — ED Provider Notes (Addendum)
CRITICAL CARE Performed by: Vanetta Mulders Total critical care time: 45 minutes Critical care time was exclusive of separately billable procedures and treating other patients. Critical care was necessary to treat or prevent imminent or life-threatening deterioration. Critical care was time spent personally by me on the following activities: development of treatment plan with patient and/or surrogate as well as nursing, discussions with consultants, evaluation of patient's response to treatment, examination of patient, obtaining history from patient or surrogate, ordering and performing treatments and interventions, ordering and review of laboratory studies, ordering and review of radiographic studies, pulse oximetry and re-evaluation of patient's condition.  Patient initially presented with concerns for possible sepsis.  Patient remained very tachycardic.  There was trouble getting labs.  So initiated sepsis protocol on her and broad-spectrum antibiotics.  Patient initially concern was for urine perhaps being infected.  Her Foley catheter clogged it was not clogged earlier clogged so we removed and replaced it.  Only about 200 cc of fluid out.  Her GFR today is 18 so consistent with an acute kidney injury.  Lactic acid was 2.3 initial troponin was 269 delta for both of them have been ordered.  COVID influenza negative leukocytosis but less than 14 at 13,000 hemoglobin 610.2 so seems to be hemoconcentrated.  Chest x-ray without any acute findings.  Patient's mental status is baseline.  Patient is a full code.  Suspect that the probable sepsis is due to the urinary tract infection but urinalysis is still pending.  But based on the urine it was grossly abnormal.  Patient has remained hemodynamically stable.  Patient without fever.  But tachycardic tachypneic blood pressures always in the low 100s.  Never became hypotensive.  Oxygen sats 98%.  We will contact medicine for admission.  Patient is followed by  the wellness clinic.   Vanetta Mulders, MD 05/25/22 2774    Vanetta Mulders, MD 05/25/22 2217

## 2022-05-26 ENCOUNTER — Inpatient Hospital Stay (HOSPITAL_COMMUNITY): Payer: No Typology Code available for payment source

## 2022-05-26 DIAGNOSIS — R6520 Severe sepsis without septic shock: Secondary | ICD-10-CM | POA: Diagnosis not present

## 2022-05-26 DIAGNOSIS — A419 Sepsis, unspecified organism: Secondary | ICD-10-CM | POA: Diagnosis not present

## 2022-05-26 LAB — BASIC METABOLIC PANEL
Anion gap: 7 (ref 5–15)
Anion gap: 6 (ref 5–15)
Anion gap: 5 (ref 5–15)
BUN: 39 mg/dL — ABNORMAL HIGH (ref 6–20)
BUN: 32 mg/dL — ABNORMAL HIGH (ref 6–20)
BUN: 30 mg/dL — ABNORMAL HIGH (ref 6–20)
CO2: 24 mmol/L (ref 22–32)
CO2: 22 mmol/L (ref 22–32)
CO2: 23 mmol/L (ref 22–32)
Calcium: 8.4 mg/dL — ABNORMAL LOW (ref 8.9–10.3)
Calcium: 8.2 mg/dL — ABNORMAL LOW (ref 8.9–10.3)
Calcium: 8.6 mg/dL — ABNORMAL LOW (ref 8.9–10.3)
Chloride: 127 mmol/L — ABNORMAL HIGH (ref 98–111)
Chloride: 127 mmol/L — ABNORMAL HIGH (ref 98–111)
Chloride: 126 mmol/L — ABNORMAL HIGH (ref 98–111)
Creatinine, Ser: 1.77 mg/dL — ABNORMAL HIGH (ref 0.44–1.00)
Creatinine, Ser: 1.4 mg/dL — ABNORMAL HIGH (ref 0.44–1.00)
Creatinine, Ser: 1.36 mg/dL — ABNORMAL HIGH (ref 0.44–1.00)
GFR, Estimated: 35 mL/min — ABNORMAL LOW (ref >60)
GFR, Estimated: 46 mL/min — ABNORMAL LOW (ref >60)
GFR, Estimated: 47 mL/min — ABNORMAL LOW (ref >60)
Glucose, Bld: 171 mg/dL — ABNORMAL HIGH (ref 70–99)
Glucose, Bld: 172 mg/dL — ABNORMAL HIGH (ref 70–99)
Glucose, Bld: 118 mg/dL — ABNORMAL HIGH (ref 70–99)
Potassium: 2.9 mmol/L — ABNORMAL LOW (ref 3.5–5.1)
Potassium: 3.2 mmol/L — ABNORMAL LOW (ref 3.5–5.1)
Potassium: 3.3 mmol/L — ABNORMAL LOW (ref 3.5–5.1)
Sodium: 158 mmol/L — ABNORMAL HIGH (ref 135–145)
Sodium: 155 mmol/L — ABNORMAL HIGH (ref 135–145)
Sodium: 154 mmol/L — ABNORMAL HIGH (ref 135–145)

## 2022-05-26 LAB — CBC
HCT: 31.9 % — ABNORMAL LOW (ref 36.0–46.0)
HCT: 31.4 % — ABNORMAL LOW (ref 36.0–46.0)
Hemoglobin: 10.2 g/dL — ABNORMAL LOW (ref 12.0–15.0)
Hemoglobin: 10 g/dL — ABNORMAL LOW (ref 12.0–15.0)
MCH: 26.8 pg (ref 26.0–34.0)
MCH: 26.5 pg (ref 26.0–34.0)
MCHC: 32 g/dL (ref 30.0–36.0)
MCHC: 31.8 g/dL (ref 30.0–36.0)
MCV: 83.7 fL (ref 80.0–100.0)
MCV: 83.3 fL (ref 80.0–100.0)
Platelets: 129 10*3/uL — ABNORMAL LOW (ref 150–400)
Platelets: 125 10*3/uL — ABNORMAL LOW (ref 150–400)
RBC: 3.81 MIL/uL — ABNORMAL LOW (ref 3.87–5.11)
RBC: 3.77 MIL/uL — ABNORMAL LOW (ref 3.87–5.11)
RDW: 17.7 % — ABNORMAL HIGH (ref 11.5–15.5)
RDW: 18 % — ABNORMAL HIGH (ref 11.5–15.5)
WBC: 19.1 10*3/uL — ABNORMAL HIGH (ref 4.0–10.5)
WBC: 15.6 10*3/uL — ABNORMAL HIGH (ref 4.0–10.5)
nRBC: 0 % (ref 0.0–0.2)
nRBC: 0 % (ref 0.0–0.2)

## 2022-05-26 LAB — CBC WITH DIFFERENTIAL/PLATELET
Abs Immature Granulocytes: 0.08 10*3/uL — ABNORMAL HIGH (ref 0.00–0.07)
Basophils Absolute: 0.1 10*3/uL (ref 0.0–0.1)
Basophils Relative: 0 %
Eosinophils Absolute: 0 10*3/uL (ref 0.0–0.5)
Eosinophils Relative: 0 %
HCT: 35.6 % — ABNORMAL LOW (ref 36.0–46.0)
Hemoglobin: 11.5 g/dL — ABNORMAL LOW (ref 12.0–15.0)
Immature Granulocytes: 0 %
Lymphocytes Relative: 13 %
Lymphs Abs: 2.3 10*3/uL (ref 0.7–4.0)
MCH: 27.1 pg (ref 26.0–34.0)
MCHC: 32.3 g/dL (ref 30.0–36.0)
MCV: 83.8 fL (ref 80.0–100.0)
Monocytes Absolute: 1.1 10*3/uL — ABNORMAL HIGH (ref 0.1–1.0)
Monocytes Relative: 6 %
Neutro Abs: 14.4 10*3/uL — ABNORMAL HIGH (ref 1.7–7.7)
Neutrophils Relative %: 81 %
Platelets: 143 10*3/uL — ABNORMAL LOW (ref 150–400)
RBC: 4.25 MIL/uL (ref 3.87–5.11)
RDW: 18.1 % — ABNORMAL HIGH (ref 11.5–15.5)
WBC: 17.9 10*3/uL — ABNORMAL HIGH (ref 4.0–10.5)
nRBC: 0 % (ref 0.0–0.2)

## 2022-05-26 LAB — COMPREHENSIVE METABOLIC PANEL
ALT: 14 U/L (ref 0–44)
AST: 20 U/L (ref 15–41)
Albumin: 2.9 g/dL — ABNORMAL LOW (ref 3.5–5.0)
Alkaline Phosphatase: 69 U/L (ref 38–126)
Anion gap: 11 (ref 5–15)
BUN: 46 mg/dL — ABNORMAL HIGH (ref 6–20)
CO2: 25 mmol/L (ref 22–32)
Calcium: 8.8 mg/dL — ABNORMAL LOW (ref 8.9–10.3)
Chloride: 122 mmol/L — ABNORMAL HIGH (ref 98–111)
Creatinine, Ser: 2.34 mg/dL — ABNORMAL HIGH (ref 0.44–1.00)
GFR, Estimated: 25 mL/min — ABNORMAL LOW (ref >60)
Glucose, Bld: 155 mg/dL — ABNORMAL HIGH (ref 70–99)
Potassium: 2.7 mmol/L — CL (ref 3.5–5.1)
Sodium: 158 mmol/L — ABNORMAL HIGH (ref 135–145)
Total Bilirubin: 1.1 mg/dL (ref 0.3–1.2)
Total Protein: 5.8 g/dL — ABNORMAL LOW (ref 6.5–8.1)

## 2022-05-26 LAB — LACTIC ACID, PLASMA
Lactic Acid, Venous: 2.1 mmol/L (ref 0.5–1.9)
Lactic Acid, Venous: 2.5 mmol/L (ref 0.5–1.9)
Lactic Acid, Venous: 2.7 mmol/L (ref 0.5–1.9)

## 2022-05-26 LAB — CREATININE, SERUM
Creatinine, Ser: 1.96 mg/dL — ABNORMAL HIGH (ref 0.44–1.00)
GFR, Estimated: 31 mL/min — ABNORMAL LOW (ref >60)

## 2022-05-26 LAB — MAGNESIUM: Magnesium: 1.7 mg/dL (ref 1.7–2.4)

## 2022-05-26 LAB — TROPONIN I (HIGH SENSITIVITY): Troponin I (High Sensitivity): 163 ng/L (ref <18)

## 2022-05-26 MED ORDER — PROPRANOLOL HCL 10 MG PO TABS: 10.0000 mg | ORAL_TABLET | Freq: Three times a day (TID) | ORAL | Status: DC

## 2022-05-26 MED ORDER — POTASSIUM CHLORIDE CRYS ER 20 MEQ PO TBCR
40.0000 meq | EXTENDED_RELEASE_TABLET | Freq: Once | ORAL | Status: DC
Start: 2022-05-26 — End: 2022-05-26

## 2022-05-26 MED ORDER — AMLODIPINE BESYLATE 5 MG PO TABS
5.0000 mg | ORAL_TABLET | Freq: Every day | ORAL | Status: DC
  Administered 2022-05-27: 5 mg via ORAL
  Filled 2022-05-26: qty 1

## 2022-05-26 MED ORDER — FLUCONAZOLE 100MG IVPB
50.0000 mg | Freq: Every day | INTRAVENOUS | Status: DC
  Administered 2022-05-26 – 2022-05-28 (×3): 50 mg via INTRAVENOUS
  Filled 2022-05-26 (×3): qty 25

## 2022-05-26 MED ORDER — POTASSIUM CHLORIDE 20 MEQ PO PACK
40.0000 meq | PACK | Freq: Once | ORAL | Status: AC
  Administered 2022-05-26: 40 meq via ORAL
  Filled 2022-05-26: qty 2

## 2022-05-26 MED ORDER — DEXTROSE 5 % IV SOLN: INTRAVENOUS | Status: DC

## 2022-05-26 MED ORDER — POTASSIUM CHLORIDE CRYS ER 20 MEQ PO TBCR
40.0000 meq | EXTENDED_RELEASE_TABLET | Freq: Once | ORAL | Status: DC
  Filled 2022-05-26: qty 2

## 2022-05-26 MED ORDER — HYDRALAZINE HCL 25 MG PO TABS
25.0000 mg | ORAL_TABLET | Freq: Three times a day (TID) | ORAL | Status: DC
  Administered 2022-05-26 – 2022-05-27 (×3): 25 mg via ORAL
  Filled 2022-05-26 (×3): qty 1

## 2022-05-26 MED ORDER — POTASSIUM CHLORIDE 10 MEQ/100ML IV SOLN
10.0000 meq | INTRAVENOUS | Status: AC
  Administered 2022-05-26 (×3): 10 meq via INTRAVENOUS
  Filled 2022-05-26 (×3): qty 100

## 2022-05-26 MED ORDER — PROPRANOLOL HCL 20 MG PO TABS
10.0000 mg | ORAL_TABLET | Freq: Three times a day (TID) | ORAL | Status: DC
  Administered 2022-05-26 – 2022-05-27 (×2): 10 mg via ORAL
  Filled 2022-05-26 (×5): qty 1

## 2022-05-26 MED ORDER — SODIUM CHLORIDE 0.9 % IV SOLN
2.0000 g | Freq: Two times a day (BID) | INTRAVENOUS | Status: DC
  Administered 2022-05-26 – 2022-05-28 (×6): 2 g via INTRAVENOUS
  Filled 2022-05-26 (×6): qty 12.5

## 2022-05-26 NOTE — ED Notes (Signed)
Toniann Fail MD paged regarding pts critical potassium 2.7

## 2022-05-26 NOTE — ED Notes (Signed)
Pt refuses to eat breakfast at this time. Head raised and attempted to feed pt. Pt was refusing to drink from straw. Will notify MD as pt has PO meds scheduled for 1000AM. Waiting for call back at this time as MD was paged by unit secretary for labs notification. Will continue to monitor.

## 2022-05-26 NOTE — Progress Notes (Addendum)
PROGRESS NOTE    Cindy Brooks  TKP:[id number] DOB: 1971-09-16 DOA: 05/25/2022 PCP: Claiborne Rigg, NP    Brief Narrative: 51 year old female with history of stroke bedbound lives at home with her daughter brought in by the daughter due to leaking Foley catheter and patient becoming less interactive than her baseline.  Daughter noticed that fully catheter was leaking.  Patient had a PEG tube in the past which was taken out currently she is able to eat through her mouth.  Patient had Foley catheter replaced in the ER and was clogged. Labs on admission sodium 158 with elevated creatinine consistent with AKI compared to prior labs.  Patient was tachycardic with leukocytosis and lactic acidosis.  Assessment & Plan:   Principal Problem:   Sepsis (HCC) Active Problems:   Hypernatremia   Antiphospholipid antibody with hypercoagulable state (HCC)   HTN (hypertension)   AKI (acute kidney injury) (HCC)   ARF (acute renal failure) (HCC)   #1 hypernatremia due to decreased p.o. intake and dehydration change fluids to D5W.  Sodium remains high at 158.  #2 AKI due  to dehydration.  UA positive for ketones.  Continue IV fluids.  Creatinine still elevated at 1.77 from 2.34 down from 3.09.  #3 possible sepsis from UTI UA consistent with UTI urine culture pending UA shows large amount of leukocytes many bacteria and more than 50 WBCs with proteins and ketones and turbid appearing urine.  Foley catheter was changed in the ER.  Continue Rocephin and follow-up culture.  Upon admission patient was tachycardic, with leukocytosis and lactic acidosis. Worsening leukocytosis 19.1 up from 13.1 on admission Trend lactic acid   #4 elevated troponin likely from #2 and #3 and demand ischemia with trending down troponins.  Continue Plavix statins and beta-blockers.  #5 thrush continue fluconazole  #6 history of essential hypertension on Norvasc hydralazine and metoprolol prior to admission Blood pressure  soft  #7 history of stroke continue Plavix Apparently patient has a history of antiphospholipid antibody syndrome with hypercoagulable state however she is only on Plavix. EKG  #8 severe hypokalemia being repleted potassium 2.9.  Check mag level.  #9 thrombocytopenia like due to sepsis on heparin follow-up in a.m.  Estimated body mass index is 27.28 kg/m as calculated from the following:   Height as of this encounter: 5\' 4"  (1.626 m).   Weight as of this encounter: 72.1 kg.  DVT prophylaxis: Heparin  code Status: Full code Family Communication: None at bedside  disposition Plan:  Status is: Inpatient Remains inpatient appropriate because: UTI/sepsis   Consultants:  None  Procedures: Foley changed in the ER on admission  Antimicrobials: Cefepime  Subjective: Earlier when I saw her patient was resting in bed appeared very lethargic and sleepy however when I called her name she opened her eyes and looked at me and then goes back to sleep Later drawn the staff informed me that she was more awake and was able to take her pills  Objective: Vitals:   05/26/22 1215 05/26/22 1245 05/26/22 1330 05/26/22 1415  BP: 99/79 101/78 107/79 103/85  Pulse: 81 80 78 81  Resp: 16 14 18 11   Temp:      TempSrc:      SpO2: 98% 97% 98% 98%  Weight:      Height:        Intake/Output Summary (Last 24 hours) at 05/26/2022 1440 Last data filed at 05/26/2022 1227 Gross per 24 hour  Intake 5689.13 ml  Output 400 ml  Net  5289.13 ml   Filed Weights   05/26/22 1127  Weight: 72.1 kg    Examination:  General exam: Appears  lethargic  respiratory system: Clear to auscultation. Respiratory effort normal. Cardiovascular system: S1 & S2 heard, RRR. No JVD, murmurs, rubs, gallops or clicks.  Trace pedal edema. Gastrointestinal system: Abdomen is nondistended, soft and nontender. No organomegaly or masses felt. Normal bowel sounds heard.  No PEG tube Central nervous system: Lethargic   extremities: Symmetric 5 x 5 power. Skin: No rashes, lesions or ulcers Psychiatry: Unable to assess Data Reviewed: I have personally reviewed following labs and imaging studies  CBC: Recent Labs  Lab 05/25/22 1820 05/26/22 0109 05/26/22 0705  WBC 13.1* 17.9* 19.1*  NEUTROABS 9.3* 14.4*  --   HGB 16.2* 11.5* 10.2*  HCT 50.5* 35.6* 31.9*  MCV 82.9 83.8 83.7  PLT 230 143* 129*   Basic Metabolic Panel: Recent Labs  Lab 05/25/22 1820 05/26/22 0019 05/26/22 0330 05/26/22 0705  NA 159* 158*  --  158*  K 4.2 2.7*  --  2.9*  CL 121* 122*  --  127*  CO2 24 25  --  24  GLUCOSE 130* 155*  --  171*  BUN 52* 46*  --  39*  CREATININE 3.09* 2.34* 1.96* 1.77*  CALCIUM 10.3 8.8*  --  8.4*   GFR: Estimated Creatinine Clearance: 37 mL/min (A) (by C-G formula based on SCr of 1.77 mg/dL (H)). Liver Function Tests: Recent Labs  Lab 05/25/22 1820 05/26/22 0019  AST 25 20  ALT 18 14  ALKPHOS 98 69  BILITOT 1.2 1.1  PROT 9.7* 5.8*  ALBUMIN 4.7 2.9*   Recent Labs  Lab 05/25/22 1820  LIPASE 44   No results for input(s): "AMMONIA" in the last 168 hours. Coagulation Profile: No results for input(s): "INR", "PROTIME" in the last 168 hours. Cardiac Enzymes: No results for input(s): "CKTOTAL", "CKMB", "CKMBINDEX", "TROPONINI" in the last 168 hours. BNP (last 3 results) No results for input(s): "PROBNP" in the last 8760 hours. HbA1C: No results for input(s): "HGBA1C" in the last 72 hours. CBG: No results for input(s): "GLUCAP" in the last 168 hours. Lipid Profile: No results for input(s): "CHOL", "HDL", "LDLCALC", "TRIG", "CHOLHDL", "LDLDIRECT" in the last 72 hours. Thyroid Function Tests: No results for input(s): "TSH", "T4TOTAL", "FREET4", "T3FREE", "THYROIDAB" in the last 72 hours. Anemia Panel: No results for input(s): "VITAMINB12", "FOLATE", "FERRITIN", "TIBC", "IRON", "RETICCTPCT" in the last 72 hours. Sepsis Labs: Recent Labs  Lab 05/25/22 1820 05/25/22 2137  05/26/22 0709  LATICACIDVEN 2.3* 2.6* 2.1*    Recent Results (from the past 240 hour(s))  Culture, blood (routine x 2)     Status: None (Preliminary result)   Collection Time: 05/25/22  3:36 PM   Specimen: BLOOD LEFT ARM  Result Value Ref Range Status   Specimen Description BLOOD LEFT ARM  Final   Special Requests   Final    BOTTLES DRAWN AEROBIC AND ANAEROBIC Blood Culture results may not be optimal due to an inadequate volume of blood received in culture bottles   Culture   Final    NO GROWTH < 24 HOURS Performed at [facility], [address], [place], [place] [address]    Report Status PENDING  Incomplete  Resp Panel by RT-PCR (Flu A&B, Covid) Anterior Nasal Swab     Status: None   Collection Time: 05/25/22  5:51 PM   Specimen: Anterior Nasal Swab  Result Value Ref Range Status   SARS Coronavirus  2 by RT PCR NEGATIVE NEGATIVE Final    Comment: (NOTE) SARS-CoV-2 target nucleic acids are NOT DETECTED.  The SARS-CoV-2 RNA is generally detectable in upper respiratory specimens during the acute phase of infection. The lowest concentration of SARS-CoV-2 viral copies this assay can detect is 138 copies/mL. A negative result does not preclude SARS-Cov-2 infection and should not be used as the sole basis for treatment or other patient management decisions. A negative result may occur with  improper specimen collection/handling, submission of specimen other than nasopharyngeal swab, presence of viral mutation(s) within the areas targeted by this assay, and inadequate number of viral copies(<138 copies/mL). A negative result must be combined with clinical observations, patient history, and epidemiological information. The expected result is Negative.  Fact Sheet for Patients:  BloggerCourse.com  Fact Sheet for Healthcare Providers:  SeriousBroker.it  This test is no t yet approved or cleared by the [place] FDA and   has been authorized for detection and/or diagnosis of SARS-CoV-2 by FDA under an Emergency Use Authorization (EUA). This EUA will remain  in effect (meaning this test can be used) for the duration of the COVID-19 declaration under Section 564(b)(1) of the Act, 21 U.S.C.section 360bbb-3(b)(1), unless the authorization is terminated  or revoked sooner.       Influenza A by PCR NEGATIVE NEGATIVE Final   Influenza B by PCR NEGATIVE NEGATIVE Final    Comment: (NOTE) The Xpert Xpress SARS-CoV-2/FLU/RSV plus assay is intended as an aid in the diagnosis of influenza from Nasopharyngeal swab specimens and should not be used as a sole basis for treatment. Nasal washings and aspirates are unacceptable for Xpert Xpress SARS-CoV-2/FLU/RSV testing.  Fact Sheet for Patients: BloggerCourse.com  Fact Sheet for Healthcare Providers: SeriousBroker.it  This test is not yet approved or cleared by the [place] FDA and has been authorized for detection and/or diagnosis of SARS-CoV-2 by FDA under an Emergency Use Authorization (EUA). This EUA will remain in effect (meaning this test can be used) for the duration of the COVID-19 declaration under Section 564(b)(1) of the Act, 21 U.S.C. section 360bbb-3(b)(1), unless the authorization is terminated or revoked.  Performed at [facility], [address], [place], [place] [address]   Culture, blood (routine x 2)     Status: None (Preliminary result)   Collection Time: 05/25/22  9:37 PM   Specimen: BLOOD  Result Value Ref Range Status   Specimen Description BLOOD RIGHT FOREARM  Final   Special Requests   Final    BOTTLES DRAWN AEROBIC AND ANAEROBIC Blood Culture adequate volume   Culture   Final    NO GROWTH < 12 HOURS Performed at [facility], [address], [place], [place] [address]    Report Status PENDING  Incomplete         Radiology Studies: CT HEAD WO CONTRAST  ( )  Result Date: 05/26/2022 CLINICAL DATA:  Altered mental status. EXAM: CT HEAD WITHOUT CONTRAST TECHNIQUE: Contiguous axial images were obtained from the base of the skull through the vertex without intravenous contrast. RADIATION DOSE REDUCTION: This exam was performed according to the departmental dose-optimization program which includes automated exposure control, adjustment of the mA and/or kV according to patient size and/or use of iterative reconstruction technique. COMPARISON:  April 19, 2022 FINDINGS: Brain: No evidence of acute infarction, hemorrhage, hydrocephalus, extra-axial collection or mass lesion/mass effect. 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 Mild, stable diffuse white matter low attenuation is again seen. Small,  chronic bilateral para thalamic and basal ganglia lacunar infarcts are noted. Vascular: No hyperdense vessel or unexpected calcification. Skull: Normal. Negative for fracture or focal lesion. Sinuses/Orbits: No acute finding. Other: None. IMPRESSION: 1. No acute intracranial abnormality. 2. Mild, chronic small vessel ischemic disease. 3. Small, chronic bilateral para thalamic and basal ganglia lacunar infarcts. Electronically Signed   By: Aram Candela M.D.   On: 05/26/2022 04:37   CT RENAL STONE STUDY  Result Date: 05/25/2022 CLINICAL DATA:  Flank pain, concern for kidney stone EXAM: CT ABDOMEN AND PELVIS WITHOUT CONTRAST TECHNIQUE: Multidetector CT imaging of the abdomen and pelvis was performed following the standard protocol without IV contrast. RADIATION DOSE REDUCTION: This exam was performed according to the departmental dose-optimization program which includes automated exposure control, adjustment of the mA and/or kV according to patient size and/or use of iterative reconstruction technique. COMPARISON:  CT abdomen and pelvis 07/04/2005 and radiographs 05/12/2022 FINDINGS: Lower chest: Respiratory motion obscures the lung bases.  Cardiomegaly. Small pericardial effusion. Hepatobiliary: No suspicious focal liver abnormality is seen. No gallstones, gallbladder wall thickening, or biliary dilatation. Pancreas: Unremarkable. No pancreatic ductal dilatation or surrounding inflammatory changes. Spleen: Normal in size without focal abnormality. Adrenals/Urinary Tract: Adrenal glands are unremarkable. Kidneys are normal, without renal calculi, suspicious focal lesion, or hydronephrosis. Foley catheter in decompressed bladder. Stomach/Bowel: Stomach is within normal limits. The appendix is normal. No evidence of bowel wall thickening, distention, or inflammatory changes. Vascular/Lymphatic: No significant vascular findings are present. No enlarged abdominal or pelvic lymph nodes. Reproductive: Unremarkable. Other: No free intraperitoneal fluid or air. Musculoskeletal: No acute or significant osseous findings. IMPRESSION: Unremarkable noncontrast CT abdomen and pelvis. Electronically Signed   By: Minerva Fester M.D.   On: 05/25/2022 23:58   DG Chest Port 1 View  Result Date: 05/25/2022 CLINICAL DATA:  Concern for sepsis. EXAM: PORTABLE CHEST 1 VIEW COMPARISON:  Chest radiograph May 12, 2022 FINDINGS: The heart size and mediastinal contours are within normal limits. No focal airspace consolidation. No visible pleural effusion or pneumothorax. Degenerative changes bilateral shoulders. IMPRESSION: No acute cardiopulmonary disease. Electronically Signed   By: Maudry Mayhew M.D.   On: 05/25/2022 16:18        Scheduled Meds:  amantadine  100 mg Oral BID   amLODipine  10 mg Oral Daily   atorvastatin  80 mg Oral Daily   clopidogrel  75 mg Oral Daily   DULoxetine  60 mg Oral Daily   folic acid  1 mg Oral Daily   heparin  5,000 Units Subcutaneous Q8H   hydrALAZINE  50 mg Oral Q8H   potassium chloride  40 mEq Oral Once   propranolol  20 mg Oral TID   vancomycin variable dose per unstable renal function (pharmacist dosing)   Does not apply  See admin instructions   Continuous Infusions:  ceFEPime (MAXIPIME) IV Stopped (05/26/22 1227)   dextrose 100 mL/hr at 05/26/22 0946   fluconazole (DIFLUCAN) IV Stopped (05/26/22 0535)     LOS: 1 day    Time spent: 37 min  Alwyn Ren, MD 05/26/2022, 2:40 PM

## 2022-05-26 NOTE — ED Notes (Signed)
MD paged at this time as pt BMP and lactic acid results came back. Lactic is 2.1, Na 158 and K of 2.9. Waiting for call back from provider. Will continue to monitor.

## 2022-05-26 NOTE — ED Notes (Addendum)
Dr.Mathews came at bedside and agreed with RN that pt is not awake enough to try to eat. Pt refused to drink from straw as attempted by RN. Hx of peg tube removal 2 months ago but pt is bed bound due to hx of stroke with minimal communication skills. SLP ordered. MD notified that pt is unable to take PO scheduled meds. Will continue to monitor.

## 2022-05-26 NOTE — ED Notes (Signed)
BMP resent at this time as first one clotted. Lab is aware.

## 2022-05-26 NOTE — Progress Notes (Signed)
Pharmacy Antibiotic Note  Cindy Brooks is a 50 y.o. female for which pharmacy has been consulted for vancomycin dosing for sepsis.  Patient with a history of stroke and is bedbound. Patient presenting for concern for drainage around foley and possible UTI.  SCr 3.09 - 0.8 baseline ---This AM SCr down to 1.77 this AM  WBC 13.1>>19.1; LA 2.3>2.6>2.1; T 98.2 > 97.7; HR 116>>83; RR 23>>18 COVID/Flu - neg  Plan: Fluconazole added by attending (monitor QTc / DDIs) Cefepime 2g q24hr >> Changed to 2g q12h Vancomycin 1500 mg once ---ordered 24hr vanc random ---schedule dosing once renal fxn stable  Trend WBC, Fever, Renal function, & Clinical course F/u cultures, clinical course, WBC, fever De-escalate when able Levels at steady state  Height: 5\' 4"  (162.6 cm) Weight: 72.1 kg (158 lb 15.2 oz) IBW/kg (Calculated) : 54.7  Temp (24hrs), Avg:97.9 F (36.6 C), Min:97.7 F (36.5 C), Max:98.2 F (36.8 C)  Recent Labs  Lab 05/25/22 1820 05/25/22 2137 05/26/22 0019 05/26/22 0109 05/26/22 0330 05/26/22 0705 05/26/22 0709  WBC 13.1*  --   --  17.9*  --  19.1*  --   CREATININE 3.09*  --  2.34*  --  1.96* 1.77*  --   LATICACIDVEN 2.3* 2.6*  --   --   --   --  2.1*    Estimated Creatinine Clearance: 37 mL/min (A) (by C-G formula based on SCr of 1.77 mg/dL (H)).    Allergies  Allergen Reactions   Aspirin Hives and Rash   Codeine Hives and Rash   Penicillins Anaphylaxis, Swelling and Other (See Comments)   Strawberry Extract Anaphylaxis and Hives    Antimicrobials this admission: vancomycin 8/12 >>  cefepime 8/12 >>  flagyl 8/12 >> 8/12 Fluconazole 8/13 >> (8/20)  Microbiology results: Pending  Thank you for allowing pharmacy to be a part of this patient's care.  [birth year]-19-1992, PharmD, BCPS 05/26/2022 11:28 AM ED Clinical Pharmacist -  [phone]

## 2022-05-26 NOTE — ED Notes (Signed)
Pt is now more awake. Fed by this RN breakfast and was able to swallow with no difficulty. MD notified. SLP order discontinued.

## 2022-05-26 NOTE — Plan of Care (Signed)
  Problem: Education: Goal: Knowledge of General Education information will improve Description: Including pain rating scale, medication(s)/side effects and non-pharmacologic comfort measures Outcome: Completed/Met

## 2022-05-27 ENCOUNTER — Inpatient Hospital Stay (HOSPITAL_COMMUNITY): Payer: No Typology Code available for payment source

## 2022-05-27 ENCOUNTER — Other Ambulatory Visit (HOSPITAL_COMMUNITY): Payer: Medicaid Other

## 2022-05-27 DIAGNOSIS — I248 Other forms of acute ischemic heart disease: Secondary | ICD-10-CM

## 2022-05-27 DIAGNOSIS — R778 Other specified abnormalities of plasma proteins: Secondary | ICD-10-CM

## 2022-05-27 DIAGNOSIS — A419 Sepsis, unspecified organism: Secondary | ICD-10-CM | POA: Diagnosis not present

## 2022-05-27 DIAGNOSIS — R6520 Severe sepsis without septic shock: Secondary | ICD-10-CM | POA: Diagnosis not present

## 2022-05-27 LAB — COMPREHENSIVE METABOLIC PANEL
ALT: 15 U/L (ref 0–44)
AST: 22 U/L (ref 15–41)
Albumin: 2.7 g/dL — ABNORMAL LOW (ref 3.5–5.0)
Alkaline Phosphatase: 69 U/L (ref 38–126)
Anion gap: 6 (ref 5–15)
BUN: 20 mg/dL (ref 6–20)
CO2: 19 mmol/L — ABNORMAL LOW (ref 22–32)
Calcium: 8.6 mg/dL — ABNORMAL LOW (ref 8.9–10.3)
Chloride: 125 mmol/L — ABNORMAL HIGH (ref 98–111)
Creatinine, Ser: 1.05 mg/dL — ABNORMAL HIGH (ref 0.44–1.00)
GFR, Estimated: >60 mL/min (ref >60)
Glucose, Bld: 115 mg/dL — ABNORMAL HIGH (ref 70–99)
Potassium: 4 mmol/L (ref 3.5–5.1)
Sodium: 150 mmol/L — ABNORMAL HIGH (ref 135–145)
Total Bilirubin: 0.5 mg/dL (ref 0.3–1.2)
Total Protein: 5.4 g/dL — ABNORMAL LOW (ref 6.5–8.1)

## 2022-05-27 LAB — CBC
HCT: 33.7 % — ABNORMAL LOW (ref 36.0–46.0)
Hemoglobin: 10.7 g/dL — ABNORMAL LOW (ref 12.0–15.0)
MCH: 26.6 pg (ref 26.0–34.0)
MCHC: 31.8 g/dL (ref 30.0–36.0)
MCV: 83.8 fL (ref 80.0–100.0)
Platelets: 122 10*3/uL — ABNORMAL LOW (ref 150–400)
RBC: 4.02 MIL/uL (ref 3.87–5.11)
RDW: 17.9 % — ABNORMAL HIGH (ref 11.5–15.5)
WBC: 9.8 10*3/uL (ref 4.0–10.5)
nRBC: 0 % (ref 0.0–0.2)

## 2022-05-27 LAB — ECHOCARDIOGRAM COMPLETE
Height: 64 in
S' Lateral: 1.7 cm
Weight: 2186.96 oz

## 2022-05-27 LAB — GLUCOSE, CAPILLARY: Glucose-Capillary: 127 mg/dL — ABNORMAL HIGH (ref 70–99)

## 2022-05-27 LAB — MAGNESIUM: Magnesium: 1.6 mg/dL — ABNORMAL LOW (ref 1.7–2.4)

## 2022-05-27 LAB — VANCOMYCIN, RANDOM: Vancomycin Rm: 11 ug/mL

## 2022-05-27 MED ORDER — NYSTATIN 100000 UNIT/ML MT SUSP
5.0000 mL | Freq: Four times a day (QID) | OROMUCOSAL | Status: DC
  Administered 2022-05-27 – 2022-05-28 (×4): 500000 [IU] via ORAL
  Filled 2022-05-27 (×5): qty 5

## 2022-05-27 MED ORDER — VANCOMYCIN HCL IN DEXTROSE 1-5 GM/200ML-% IV SOLN
1000.0000 mg | INTRAVENOUS | Status: DC
  Administered 2022-05-27 – 2022-05-28 (×2): 1000 mg via INTRAVENOUS
  Filled 2022-05-27 (×2): qty 200

## 2022-05-27 MED ORDER — CHLORHEXIDINE GLUCONATE CLOTH 2 % EX PADS
6.0000 | MEDICATED_PAD | Freq: Every day | CUTANEOUS | Status: DC
  Administered 2022-05-27 – 2022-05-28 (×2): 6 via TOPICAL

## 2022-05-27 MED ORDER — ORAL CARE MOUTH RINSE: 15.0000 mL | OROMUCOSAL | Status: DC | PRN

## 2022-05-27 MED ORDER — ORAL CARE MOUTH RINSE
15.0000 mL | OROMUCOSAL | Status: DC
  Administered 2022-05-27 – 2022-05-28 (×6): 15 mL via OROMUCOSAL

## 2022-05-27 MED ORDER — ENSURE ENLIVE PO LIQD
237.0000 mL | Freq: Two times a day (BID) | ORAL | Status: DC
  Administered 2022-05-28: 237 mL via ORAL

## 2022-05-27 MED ORDER — ADULT MULTIVITAMIN W/MINERALS CH
1.0000 | ORAL_TABLET | Freq: Every day | ORAL | Status: DC
  Administered 2022-05-27 – 2022-05-28 (×2): 1 via ORAL
  Filled 2022-05-27 (×2): qty 1

## 2022-05-27 NOTE — Progress Notes (Signed)
PROGRESS NOTE    Cindy Brooks  ZOX:[id number] DOB: [birth year] DOA: 05/25/2022 PCP: Claiborne Rigg, NP    Brief Narrative: 51 year old female with history of stroke bedbound lives at home with her daughter brought in by the daughter due to leaking Foley catheter and patient becoming less interactive than her baseline.  Daughter noticed that fully catheter was leaking.  Patient had a PEG tube in the past which was taken out currently she is able to eat through her mouth.  Patient had Foley catheter replaced in the ER and was clogged. Labs on admission sodium 158 with elevated creatinine consistent with AKI compared to prior labs.  Patient was tachycardic with leukocytosis and lactic acidosis.  Assessment & Plan:   Principal Problem:   Sepsis (HCC) Active Problems:   Hypernatremia   Antiphospholipid antibody with hypercoagulable state (HCC)   HTN (hypertension)   AKI (acute kidney injury) (HCC)   ARF (acute renal failure) (HCC)   #1 hypernatremia due to decreased p.o. intake and dehydration change fluids to D5W.  Sodium remains high at 150 from a 158.  Continue D5W  #2 AKI due  to dehydration.  UA positive for ketones.  Continue IV fluids.  Creatinine 1.05 from 1.77 from 2.34 down from 3.09.  #3 possible sepsis from UTI UA consistent with UTI urine culture pending UA shows large amount of leukocytes many bacteria and more than 50 WBCs with proteins and ketones and turbid appearing urine.  Foley catheter was changed in the ER.  Continue Rocephin and follow-up culture.  Upon admission patient was tachycardic, with leukocytosis and lactic acidosis. Worsening leukocytosis 19.1 up from 13.1 on admission Urine culture with Proteus and Enterococcus sensitivity pending.  #4 elevated troponin likely from #2 and #3 and demand ischemia with trending down troponins.  Continue Plavix statins and beta-blockers.  #5 thrush continue fluconazole and nystatin swish and spit  #6 history of essential  hypertension on Norvasc hydralazine and metoprolol prior to admission Blood pressure soft  #7 history of stroke continue Plavix Apparently patient has a history of antiphospholipid antibody syndrome with hypercoagulable state however she is only on Plavix. EKG  #8 severe hypokalemia resolved.    #9 thrombocytopenia like due to sepsis on heparin follow-up in a.m.  #10 hypomagnesemia replete and recheck in a.m.  Estimated body mass index is 23.46 kg/m as calculated from the following:   Height as of this encounter: 5\' 4"  (1.626 m).   Weight as of this encounter: 62 kg.  DVT prophylaxis: Heparin  code Status: Full code Family Communication: Discussed with daughter on the phone disposition Plan:  Status is: Inpatient Remains inpatient appropriate because: UTI/sepsis   Consultants:  None  Procedures: Foley changed in the ER on admission  Antimicrobials: Cefepime  Subjective:  Patient is resting in bed Looks like she is trying to eat breakfast more awake than yesterday Daughter reported she does not speak much Sodium 150 creatinine 1.05 mag 1.6 Objective: Vitals:   05/27/22 0440 05/27/22 0927 05/27/22 1359 05/27/22 1401  BP: 117/85 109/85 108/82 108/82  Pulse: 81 88  86  Resp: 18 16    Temp: (!) 97.5 F (36.4 C) 97.7 F (36.5 C)    TempSrc: Oral Oral    SpO2: 100% 100%    Weight:      Height:        Intake/Output Summary (Last 24 hours) at 05/27/2022 1410 Last data filed at 05/27/2022 1300 Gross per 24 hour  Intake 1595.07 ml  Output 400  ml  Net 1195.07 ml    Filed Weights   05/26/22 1127 05/26/22 1855  Weight: 72.1 kg 62 kg    Examination:  General exam: Awake and chronically ill looking  respiratory system: Clear to auscultation. Respiratory effort normal. Cardiovascular system: S1 & S2 heard, RRR. No JVD, murmurs, rubs, gallops or clicks.  Trace pedal edema. Gastrointestinal system: Abdomen is nondistended, soft and nontender. No organomegaly or masses  felt. Normal bowel sounds heard.  No PEG tube Central nervous system: Awake extremities: Left lower extremity edema  skin: No rashes, lesions or ulcers Psychiatry: Unable to assess Data Reviewed: I have personally reviewed following labs and imaging studies  CBC: Recent Labs  Lab 05/25/22 1820 05/26/22 0109 05/26/22 0705 05/26/22 1508 05/27/22 0824  WBC 13.1* 17.9* 19.1* 15.6* 9.8  NEUTROABS 9.3* 14.4*  --   --   --   HGB 16.2* 11.5* 10.2* 10.0* 10.7*  HCT 50.5* 35.6* 31.9* 31.4* 33.7*  MCV 82.9 83.8 83.7 83.3 83.8  PLT 230 143* 129* 125* 122*    Basic Metabolic Panel: Recent Labs  Lab 05/26/22 0019 05/26/22 0330 05/26/22 0705 05/26/22 1427 05/26/22 1856 05/27/22 0824  NA 158*  --  158* 155* 154* 150*  K 2.7*  --  2.9* 3.2* 3.3* 4.0  CL 122*  --  127* 127* 126* 125*  CO2 25  --  24 22 23  19*  GLUCOSE 155*  --  171* 172* 118* 115*  BUN 46*  --  39* 32* 30* 20  CREATININE 2.34* 1.96* 1.77* 1.40* 1.36* 1.05*  CALCIUM 8.8*  --  8.4* 8.2* 8.6* 8.6*  MG  --   --   --   --  1.7 1.6*    GFR: Estimated Creatinine Clearance: 55.4 mL/min (A) (by C-G formula based on SCr of 1.05 mg/dL (H)). Liver Function Tests: Recent Labs  Lab 05/25/22 1820 05/26/22 0019 05/27/22 0824  AST 25 20 22   ALT 18 14 15   ALKPHOS 98 69 69  BILITOT 1.2 1.1 0.5  PROT 9.7* 5.8* 5.4*  ALBUMIN 4.7 2.9* 2.7*    Recent Labs  Lab 05/25/22 1820  LIPASE 44    No results for input(s): "AMMONIA" in the last 168 hours. Coagulation Profile: No results for input(s): "INR", "PROTIME" in the last 168 hours. Cardiac Enzymes: No results for input(s): "CKTOTAL", "CKMB", "CKMBINDEX", "TROPONINI" in the last 168 hours. BNP (last 3 results) No results for input(s): "PROBNP" in the last 8760 hours. HbA1C: No results for input(s): "HGBA1C" in the last 72 hours. CBG: No results for input(s): "GLUCAP" in the last 168 hours. Lipid Profile: No results for input(s): "CHOL", "HDL", "LDLCALC", "TRIG",  "CHOLHDL", "LDLDIRECT" in the last 72 hours. Thyroid Function Tests: No results for input(s): "TSH", "T4TOTAL", "FREET4", "T3FREE", "THYROIDAB" in the last 72 hours. Anemia Panel: No results for input(s): "VITAMINB12", "FOLATE", "FERRITIN", "TIBC", "IRON", "RETICCTPCT" in the last 72 hours. Sepsis Labs: Recent Labs  Lab 05/25/22 2137 05/26/22 0709 05/26/22 1308 05/26/22 1856  LATICACIDVEN 2.6* 2.1* 2.5* 2.7*     Recent Results (from the past 240 hour(s))  Culture, blood (routine x 2)     Status: None (Preliminary result)   Collection Time: 05/25/22  3:36 PM   Specimen: BLOOD LEFT ARM  Result Value Ref Range Status   Specimen Description BLOOD LEFT ARM  Final   Special Requests   Final    BOTTLES DRAWN AEROBIC AND ANAEROBIC Blood Culture results may not be optimal due to an inadequate volume of  blood received in culture bottles   Culture   Final    NO GROWTH 2 DAYS Performed at [facility], [address] [place], [place] [address]    Report Status PENDING  Incomplete  Resp Panel by RT-PCR (Flu A&B, Covid) Anterior Nasal Swab     Status: None   Collection Time: 05/25/22  5:51 PM   Specimen: Anterior Nasal Swab  Result Value Ref Range Status   SARS Coronavirus 2 by RT PCR NEGATIVE NEGATIVE Final    Comment: (NOTE) SARS-CoV-2 target nucleic acids are NOT DETECTED.  The SARS-CoV-2 RNA is generally detectable in upper respiratory specimens during the acute phase of infection. The lowest concentration of SARS-CoV-2 viral copies this assay can detect is 138 copies/mL. A negative result does not preclude SARS-Cov-2 infection and should not be used as the sole basis for treatment or other patient management decisions. A negative result may occur with  improper specimen collection/handling, submission of specimen other than nasopharyngeal swab, presence of viral mutation(s) within the areas targeted by this assay, and inadequate number of viral copies(<138 copies/mL). A  negative result must be combined with clinical observations, patient history, and epidemiological information. The expected result is Negative.  Fact Sheet for Patients:  BloggerCourse.comhttps://www.fda.gov/media/152166/download  Fact Sheet for Healthcare Providers:  SeriousBroker.ithttps://www.fda.gov/media/152162/download  This test is no t yet approved or cleared by the [place] FDA and  has been authorized for detection and/or diagnosis of SARS-CoV-2 by FDA under an Emergency Use Authorization (EUA). This EUA will remain  in effect (meaning this test can be used) for the duration of the COVID-19 declaration under Section 564(b)(1) of the Act, 21 U.S.C.section 360bbb-3(b)(1), unless the authorization is terminated  or revoked sooner.       Influenza A by PCR NEGATIVE NEGATIVE Final   Influenza B by PCR NEGATIVE NEGATIVE Final    Comment: (NOTE) The Xpert Xpress SARS-CoV-2/FLU/RSV plus assay is intended as an aid in the diagnosis of influenza from Nasopharyngeal swab specimens and should not be used as a sole basis for treatment. Nasal washings and aspirates are unacceptable for Xpert Xpress SARS-CoV-2/FLU/RSV testing.  Fact Sheet for Patients: BloggerCourse.comhttps://www.fda.gov/media/152166/download  Fact Sheet for Healthcare Providers: SeriousBroker.ithttps://www.fda.gov/media/152162/download  This test is not yet approved or cleared by the [place] FDA and has been authorized for detection and/or diagnosis of SARS-CoV-2 by FDA under an Emergency Use Authorization (EUA). This EUA will remain in effect (meaning this test can be used) for the duration of the COVID-19 declaration under Section 564(b)(1) of the Act, 21 U.S.C. section 360bbb-3(b)(1), unless the authorization is terminated or revoked.  Performed at [facility], [address] [place], [place] [address]   Culture, blood (routine x 2)     Status: None (Preliminary result)   Collection Time: 05/25/22  9:37 PM   Specimen: BLOOD  Result Value Ref Range  Status   Specimen Description BLOOD RIGHT FOREARM  Final   Special Requests   Final    BOTTLES DRAWN AEROBIC AND ANAEROBIC Blood Culture adequate volume   Culture   Final    NO GROWTH 2 DAYS Performed at [facility], [address] [place], [place] [address]    Report Status PENDING  Incomplete  Urine Culture     Status: Abnormal (Preliminary result)   Collection Time: 05/25/22  9:55 PM   Specimen: Urine, Catheterized  Result Value Ref Range Status   Specimen Description URINE, CATHETERIZED  Final   Special Requests  Final    NONE Performed at [facility], [address] [place], [place] [address]    Culture (A)  Final    >=100,000 COLONIES/mL ENTEROCOCCUS FAECALIS 80,000 COLONIES/mL PROTEUS MIRABILIS    Report Status PENDING  Incomplete         Radiology Studies: CT HEAD WO CONTRAST ( )  Result Date: 05/26/2022 CLINICAL DATA:  Altered mental status. EXAM: CT HEAD WITHOUT CONTRAST TECHNIQUE: Contiguous axial images were obtained from the base of the skull through the vertex without intravenous contrast. RADIATION DOSE REDUCTION: This exam was performed according to the departmental dose-optimization program which includes automated exposure control, adjustment of the mA and/or kV according to patient size and/or use of iterative reconstruction technique. COMPARISON:  April 19, 2022 FINDINGS: Brain: No evidence of acute infarction, hemorrhage, hydrocephalus, extra-axial collection or mass lesion/mass effect. 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 Mild, stable diffuse white matter low attenuation is again seen. Small, chronic bilateral para thalamic and basal ganglia lacunar infarcts are noted. Vascular: No hyperdense vessel or unexpected calcification. Skull: Normal. Negative for fracture or focal lesion. Sinuses/Orbits: No acute finding. Other: None. IMPRESSION: 1. No acute intracranial abnormality. 2. Mild, chronic small vessel ischemic  disease. 3. Small, chronic bilateral para thalamic and basal ganglia lacunar infarcts. Electronically Signed   By: Aram Candela M.D.   On: 05/26/2022 04:37   CT RENAL STONE STUDY  Result Date: 05/25/2022 CLINICAL DATA:  Flank pain, concern for kidney stone EXAM: CT ABDOMEN AND PELVIS WITHOUT CONTRAST TECHNIQUE: Multidetector CT imaging of the abdomen and pelvis was performed following the standard protocol without IV contrast. RADIATION DOSE REDUCTION: This exam was performed according to the departmental dose-optimization program which includes automated exposure control, adjustment of the mA and/or kV according to patient size and/or use of iterative reconstruction technique. COMPARISON:  CT abdomen and pelvis 07/04/2005 and radiographs 05/12/2022 FINDINGS: Lower chest: Respiratory motion obscures the lung bases. Cardiomegaly. Small pericardial effusion. Hepatobiliary: No suspicious focal liver abnormality is seen. No gallstones, gallbladder wall thickening, or biliary dilatation. Pancreas: Unremarkable. No pancreatic ductal dilatation or surrounding inflammatory changes. Spleen: Normal in size without focal abnormality. Adrenals/Urinary Tract: Adrenal glands are unremarkable. Kidneys are normal, without renal calculi, suspicious focal lesion, or hydronephrosis. Foley catheter in decompressed bladder. Stomach/Bowel: Stomach is within normal limits. The appendix is normal. No evidence of bowel wall thickening, distention, or inflammatory changes. Vascular/Lymphatic: No significant vascular findings are present. No enlarged abdominal or pelvic lymph nodes. Reproductive: Unremarkable. Other: No free intraperitoneal fluid or air. Musculoskeletal: No acute or significant osseous findings. IMPRESSION: Unremarkable noncontrast CT abdomen and pelvis. Electronically Signed   By: Minerva Fester M.D.   On: 05/25/2022 23:58   DG Chest Port 1 View  Result Date: 05/25/2022 CLINICAL DATA:  Concern for sepsis. EXAM:  PORTABLE CHEST 1 VIEW COMPARISON:  Chest radiograph May 12, 2022 FINDINGS: The heart size and mediastinal contours are within normal limits. No focal airspace consolidation. No visible pleural effusion or pneumothorax. Degenerative changes bilateral shoulders. IMPRESSION: No acute cardiopulmonary disease. Electronically Signed   By: Maudry Mayhew M.D.   On: 05/25/2022 16:18        Scheduled Meds:  amantadine  100 mg Oral BID   amLODipine  5 mg Oral Daily   atorvastatin  80 mg Oral Daily   Chlorhexidine Gluconate Cloth  6 each Topical Daily   clopidogrel  75 mg Oral Daily   DULoxetine  60 mg Oral Daily   feeding  supplement  237 mL Oral BID BM   folic acid  1 mg Oral Daily   heparin  5,000 Units Subcutaneous Q8H   hydrALAZINE  25 mg Oral Q8H   multivitamin with minerals  1 tablet Oral Daily   mouth rinse  15 mL Mouth Rinse 4 times per day   propranolol  10 mg Oral TID   vancomycin variable dose per unstable renal function (pharmacist dosing)   Does not apply See admin instructions   Continuous Infusions:  ceFEPime (MAXIPIME) IV 2 g (05/27/22 1047)   dextrose 100 mL/hr at 05/27/22 0435   fluconazole (DIFLUCAN) IV 50 mg (05/27/22 1052)   vancomycin 1,000 mg (05/27/22 1315)     LOS: 2 days    Time spent: 37 min  Alwyn Ren, MD 05/27/2022, 2:10 PM

## 2022-05-27 NOTE — Progress Notes (Signed)
Spoke with pharmacy and diflucan will only run at 80ml per hr on the pump  pump will not reprogram to 25  per ruth in pharmacy that is ok

## 2022-05-27 NOTE — Progress Notes (Addendum)
Pharmacy Antibiotic Note  Cindy Brooks is a 51 y.o. female for which pharmacy was  consulted 05/25/22 for vancomycin dosing for sepsis.  Patient with a history of stroke and is bedbound. Patient presenting for concern for drainage around foley and possible UTI.   AKI has improved, now SCr down to 1.05.  Baseline SCr 0.8 WBC  down to 9.8 within normal.   LA  2.3>2.6>2.1>2.5>2.7 COVID/Flu - neg  8/14 VR = 11  8/12 BCx x2:  no growth to date.  8/12 UCx:  100K col/ml  enterococcus faecalis,  and 80K col/ml  Proteus ,  pending   Plan: Vancomycin 1000 mg IV Q 24 hrs. Goal AUC 400-550.  Expected AUC: 445 , SCr used: 1.05   Continue Cefepime 2 g IV q12h Metronidazole per MD F/u cultures, clinical course, WBC, fever De-escalate when able Levels at steady state  Height: 5\' 4"  (162.6 cm) Weight: 62 kg (136 lb 11 oz) IBW/kg (Calculated) : 54.7  Temp (24hrs), Avg:97.7 F (36.5 C), Min:97.3 F (36.3 C), Max:98 F (36.7 C)  Recent Labs  Lab 05/25/22 1820 05/25/22 2137 05/26/22 0019 05/26/22 0109 05/26/22 0330 05/26/22 0705 05/26/22 0709 05/26/22 1308 05/26/22 1427 05/26/22 1508 05/26/22 1856 05/27/22 0824  WBC 13.1*  --   --  17.9*  --  19.1*  --   --   --  15.6*  --  9.8  CREATININE 3.09*  --    < >  --  1.96* 1.77*  --   --  1.40*  --  1.36* 1.05*  LATICACIDVEN 2.3* 2.6*  --   --   --   --  2.1* 2.5*  --   --  2.7*  --   VANCORANDOM  --   --   --   --   --   --   --   --   --   --   --  11   < > = values in this interval not displayed.    Estimated Creatinine Clearance: 55.4 mL/min (A) (by C-G formula based on SCr of 1.05 mg/dL (H)).    Allergies  Allergen Reactions   Aspirin Hives and Rash   Codeine Hives and Rash   Penicillins Anaphylaxis, Swelling and Other (See Comments)   Strawberry Extract Anaphylaxis and Hives    Antimicrobials this admission: vancomycin 8/12 >>  cefepime 8/12 >>  flagyl 8/12 >>  Fluconazole 8/13 >> (8/20)  Levels and dose adjustments:    8/14 Vanc random level = 11 (renal function improved.  Ordered Vancomycin 1000 mg IV q24hr)  Microbiology results: 8/12 BCx x2:  ngtd 8/12 UCx:  100K col/ml  enterococcus faecalis,  and 80K col/ml  Proteus ,  pending  Thank you for allowing pharmacy to be a part of this patient's care.  10/12, RPh Clinical Pharmacist [phone] 05/27/2022 12:14 PM Please check AMION for all [facility] phone numbers After 10:00 PM, call Main Pharmacy [phone]

## 2022-05-27 NOTE — Progress Notes (Signed)
  Echocardiogram 2D Echocardiogram has been performed.  Milda Smart 05/27/2022, 3:18 PM

## 2022-05-27 NOTE — Plan of Care (Signed)
  Problem: Clinical Measurements: Goal: Respiratory complications will improve Outcome: Progressing Goal: Cardiovascular complication will be avoided Outcome: Progressing   Problem: Elimination: Goal: Will not experience complications related to bowel motility Outcome: Progressing Goal: Will not experience complications related to urinary retention Outcome: Progressing   

## 2022-05-27 NOTE — TOC Initial Note (Signed)
Transition of Care [facility]) - Initial/Assessment Note    Patient Details  Name: Cindy Brooks MRN: [id number] Date of Birth: [birth year]  Transition [facility]) CM/SW Contact:    Kermit Balo, RN Phone Number: 05/27/2022, 4:12 PM  Clinical Narrative:                 Pt is from home with her daughter. Pt is non verbal and wheelchair bound at home. Daughter has been providing all needed care but needs some assistance at home. CM will fax in PCS prior auth from to [facility].  Daughter has been providing transportation for her mother but has been having a hard time with this. CM will relay information for transportation through her insurance: [employer] [id number]. CM will also f/u with any needed DME at home.   Expected Discharge Plan: Home/Self Care Barriers to Discharge: Continued Medical Work up   Patient Goals and CMS Choice   CMS Medicare.gov Compare Post Acute Care list provided to:: Patient Represenative (must comment) Choice offered to / list presented to : Adult Children  Expected Discharge Plan and Services Expected Discharge Plan: Home/Self Care   Discharge Planning Services: CM Consult   Living arrangements for the past 2 months: Single Family Home                                      Prior Living Arrangements/Services Living arrangements for the past 2 months: Single Family Home Lives with:: Adult Children          Need for Family Participation in Patient Care: Yes (Comment) Care giver support system in place?: Yes (comment) Current home services: DME (wheelchair/ recliner/ 3 in 1/ walker) Criminal Activity/Legal Involvement Pertinent to Current Situation/Hospitalization: No - Comment as needed  Activities of Daily Living      Permission Sought/Granted                  Emotional Assessment Appearance:: Appears stated age         Psych Involvement: No (comment)  Admission diagnosis:  AKI (acute kidney injury) (HCC)  [N17.9] Sepsis (HCC) [A41.9] Sepsis, due to unspecified organism, unspecified whether acute organ dysfunction present [facility]) [A41.9] Patient Active Problem List   Diagnosis Date Noted   Sepsis (HCC) 05/25/2022   ARF (acute renal failure) (HCC) 05/25/2022   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) 05/14/2022   Catheter-associated urinary tract infection (HCC) 04/20/2022   Sepsis secondary to UTI (HCC) 04/19/2022   Acute encephalopathy 03/24/2022   Tachycardia    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   Dysphagia as late effect of stroke 03/20/2022   Acute urinary retention 03/20/2022   Normocytic anemia 03/20/2022   CVA (cerebral vascular accident) (HCC) 03/20/2022   Hypernatremia 02/07/2022   Coma (HCC) 02/07/2022   Antiphospholipid antibody with hypercoagulable state (HCC) 02/04/2022   Pressure injury of skin 02/01/2022   PEG  status (HCC) 01/29/2022   UTI (urinary tract infection) 01/28/2022   HTN (hypertension)    AKI (acute kidney injury) (HCC)    Acute bilateral thalamic, midbrain and bilateral cerebral ischemic CVA 01/22/2022   PCP:  Claiborne Rigg, NP Pharmacy:   [id number] - [place], [place] - [address] [place] [address] Phone: [phone] Fax: [phone]  [facility] [address] [place] [address] Phone: [phone]  Fax: [phone]     Social Determinants of Health (SDOH) Interventions    Readmission Risk Interventions    03/13/2022    9:03 AM  Readmission Risk Prevention Plan  Transportation Screening Complete  PCP or Specialist Appt within 5-7 Days Complete  Home Care Screening Complete  Medication Review (RN CM) Complete

## 2022-05-27 NOTE — Progress Notes (Signed)
Initial Nutrition Assessment  DOCUMENTATION CODES:  Not applicable  INTERVENTION:  Adjust diet order to DYS 2 Ensure Enlive po BID, each supplement provides 350 kcal and 20 grams of protein. MVI with minerals daily  NUTRITION DIAGNOSIS:  Inadequate oral intake related to lethargy/confusion as evidenced by meal completion < 50%.  GOAL:   Patient will meet greater than or equal to 90% of their needs  MONITOR:   PO intake, Supplement acceptance, Labs, I & O's  REASON FOR ASSESSMENT:   Consult Assessment of nutrition requirement/status  ASSESSMENT:   Pt with hx of HTN, TIA, prior EtOH abuse, and prior CVA with residual deficits brought to ED with a leaking foley and concern for infection.  Several recent admissions noted. Has been followed by RD in the past. Hx of PEG tube during long admission after CVA, was removed several weeks ago due to good PO intake.   Pt resting in bed, nonverbal. RN administering medications via applesauce. Noted poor intake of breakfast in flowsheet. Will liberalize diet and recommend nursing staff assist with feeding pt until she returns to her baseline (can feed herself independently). Do note that most recent SLP eval in June recommended a DYS 2 diet, will order for now until can determine if pt can handle more advanced textures.  Average Meal Intake: 8/14: 0% intake x 1 recorded meals  Nutritionally Relevant Medications: Scheduled Meds:  atorvastatin  80 mg Oral Daily   folic acid  1 mg Oral Daily   Continuous Infusions:  ceFEPime (MAXIPIME) IV 2 g (05/27/22 1047)   dextrose 100 mL/hr at 05/27/22 0435   fluconazole (DIFLUCAN) IV 50 mg (05/27/22 1052)   vancomycin     Labs Reviewed: Na 150, chloride 125 Creatinine 1.05 Mg 1.6  NUTRITION - FOCUSED PHYSICAL EXAM: Flowsheet Row Most Recent Value  Orbital Region No depletion  Upper Arm Region No depletion  Thoracic and Lumbar Region No depletion  Buccal Region No depletion  Temple  Region No depletion  Clavicle Bone Region No depletion  Clavicle and Acromion Bone Region No depletion  Scapular Bone Region No depletion  Dorsal Hand No depletion  Patellar Region No depletion  Anterior Thigh Region No depletion  Posterior Calf Region No depletion  [edema]  Edema (RD Assessment) Moderate  [BLE]  Hair Reviewed  Eyes Reviewed  Mouth Reviewed  Skin Reviewed  Nails Reviewed   Diet Order:   Diet Order             Diet Heart Room service appropriate? Yes; Fluid consistency: Thin  Diet effective now                   EDUCATION NEEDS:  Not appropriate for education at this time  Skin:  Skin Assessment: Reviewed RN Assessment  Last BM:  8/14 - type 6  Height:  Ht Readings from Last 1 Encounters:  05/26/22 5\' 4"  (1.626 m)    Weight:  Wt Readings from Last 1 Encounters:  05/26/22 62 kg    Ideal Body Weight:  54.6 kg  BMI:  Body mass index is 23.46 kg/m.  Estimated Nutritional Needs:  Kcal:  1600-1800 kcal/d Protein:  80-90 g.d Fluid:  >/=1.8L/d    05/28/22, RD, LDN Clinical Dietitian RD pager # available in [facility]  After hours/weekend pager # available in [facility]

## 2022-05-28 DIAGNOSIS — A419 Sepsis, unspecified organism: Secondary | ICD-10-CM | POA: Diagnosis not present

## 2022-05-28 DIAGNOSIS — R6520 Severe sepsis without septic shock: Secondary | ICD-10-CM | POA: Diagnosis not present

## 2022-05-28 LAB — BASIC METABOLIC PANEL
Anion gap: 6 (ref 5–15)
BUN: 10 mg/dL (ref 6–20)
CO2: 19 mmol/L — ABNORMAL LOW (ref 22–32)
Calcium: 8.5 mg/dL — ABNORMAL LOW (ref 8.9–10.3)
Chloride: 118 mmol/L — ABNORMAL HIGH (ref 98–111)
Creatinine, Ser: 0.85 mg/dL (ref 0.44–1.00)
GFR, Estimated: >60 mL/min (ref >60)
Glucose, Bld: 115 mg/dL — ABNORMAL HIGH (ref 70–99)
Potassium: 4.3 mmol/L (ref 3.5–5.1)
Sodium: 143 mmol/L (ref 135–145)

## 2022-05-28 LAB — CBC
HCT: 32.8 % — ABNORMAL LOW (ref 36.0–46.0)
Hemoglobin: 10.8 g/dL — ABNORMAL LOW (ref 12.0–15.0)
MCH: 26.9 pg (ref 26.0–34.0)
MCHC: 32.9 g/dL (ref 30.0–36.0)
MCV: 81.6 fL (ref 80.0–100.0)
Platelets: 118 10*3/uL — ABNORMAL LOW (ref 150–400)
RBC: 4.02 MIL/uL (ref 3.87–5.11)
RDW: 17.3 % — ABNORMAL HIGH (ref 11.5–15.5)
WBC: 8.7 10*3/uL (ref 4.0–10.5)
nRBC: 0 % (ref 0.0–0.2)

## 2022-05-28 LAB — URINE CULTURE: Culture: >=100000 — AB

## 2022-05-28 MED ORDER — CHLORHEXIDINE GLUCONATE 0.12 % MT SOLN: 15.0000 mL | Freq: Two times a day (BID) | OROMUCOSAL | 2 refills | Status: DC

## 2022-05-28 MED ORDER — NYSTATIN 100000 UNIT/ML MT SUSP: 5.0000 mL | Freq: Four times a day (QID) | OROMUCOSAL | 2 refills | Status: DC

## 2022-05-28 MED ORDER — LEVOFLOXACIN 500 MG PO TABS: 500.0000 mg | ORAL_TABLET | Freq: Every day | ORAL | 0 refills | Status: AC

## 2022-05-28 MED ORDER — FLUCONAZOLE 100 MG PO TABS
100.0000 mg | ORAL_TABLET | Freq: Every day | ORAL | 0 refills | Status: AC
Start: 2022-05-28 — End: 2022-06-01

## 2022-05-28 MED ORDER — PROPRANOLOL HCL 20 MG PO TABS: 10.0000 mg | ORAL_TABLET | Freq: Three times a day (TID) | ORAL | 0 refills | Status: DC

## 2022-05-28 NOTE — Plan of Care (Signed)
  Problem: Clinical Measurements: Goal: Diagnostic test results will improve Outcome: Completed/Met Goal: Respiratory complications will improve Outcome: Completed/Met

## 2022-05-28 NOTE — Progress Notes (Signed)
Pt discharged with P[facility] EMS. Pt alert and oriented to baseline. Vitals stable at time of discharge. Discharge instructions and paperwork given to EMS.

## 2022-05-28 NOTE — Discharge Summary (Signed)
Physician Discharge Summary  Jadan Rouillard ZDG:[id number] DOB: 1970/11/14 DOA: 05/25/2022  PCP: Claiborne Rigg, NP  Admit date: 05/25/2022 Discharge date: 05/28/2022  Admitted From: home Disposition:  home Recommendations for Outpatient Follow-up:  Follow up with PCP in 1-2 weeks Please obtain BMP/CBC I/mag in one week Change her Foley catheter every month.  This was changed on 05/25/2022.  Home Health: Yes Equipment/Devices: None Discharge Condition: Stable CODE STATUS: Full code Diet recommendation: Cardiac Brief summery-51 year old female with history of stroke bedbound lives at home with her daughter brought in by the daughter due to leaking Foley catheter and patient becoming less interactive than her baseline.  Daughter noticed that fully catheter was leaking.  Patient had a PEG tube in the past which was taken out currently she is able to eat through her mouth.  Patient had Foley catheter replaced in the ER and was clogged. Labs on admission sodium 158 with elevated creatinine consistent with AKI compared to prior labs.  Patient was tachycardic with leukocytosis and lactic acidosis  Discharge Diagnoses:  Principal Problem:   Sepsis (HCC) Active Problems:   Hypernatremia   Antiphospholipid antibody with hypercoagulable state (HCC)   HTN (hypertension)   AKI (acute kidney injury) (HCC)   ARF (acute renal failure) (HCC)     #1 hypernatremia due to decreased p.o. intake and dehydration.resolved with IV fluids.  Sodium 143 from 158 on discharge.   #2 AKI due  to dehydration.  UA positive for ketones.  Treated with IV fluids.  Creatinine 0.85 from 3.09 on admission.     #3 sepsis from UTI.  Urine culture grew Proteus Mirabella's and Enterococcus faecalis.  I discussed with ID and send the patient on levofloxacin 500 daily for 7 days.  Foley catheter was changed in the ER.    #4 elevated troponin to 69 and 163 from demand ischemia.  Continue Plavix statin and beta-blockers.      #5 thrush continue fluconazole and nystatin swish and spit   #6 history of essential hypertension her blood pressure was soft during the hospital stay upon discharge Norvasc and hydralazine were stopped and the dose of propanolol was decreased to 10 mg 3 times a day.     #7 history of stroke continue Plavix Apparently patient has a history of antiphospholipid antibody syndrome with hypercoagulable state however she is only on Plavix.   #8 severe hypokalemia resolved.     #9 thrombocytopenia-platelets were 118 on discharge.   #10 hypomagnesemia repleted   Nutrition Problem: Inadequate oral intake Etiology: lethargy/confusion    Signs/Symptoms: meal completion < 50%     Interventions: Refer to RD note for recommendations  Estimated body mass index is 23.46 kg/m as calculated from the following:   Height as of this encounter: 5\' 4"  (1.626 m).   Weight as of this encounter: 62 kg.  Discharge Instructions  Discharge Instructions     Diet - low sodium heart healthy   Complete by: As directed    Increase activity slowly   Complete by: As directed       Allergies as of 05/28/2022       Reactions   Aspirin Hives, Rash   Codeine Hives, Rash   Penicillins Anaphylaxis, Swelling, Other (See Comments)   Strawberry Extract Anaphylaxis, Hives        Medication List     STOP taking these medications    amLODipine 10 MG tablet Commonly known as: NORVASC   glycopyrrolate 1 MG tablet Commonly known as: ROBINUL  hydrALAZINE 50 MG tablet Commonly known as: APRESOLINE       TAKE these medications    acetaminophen 325 MG tablet Commonly known as: Tylenol Take 2 tablets (650 mg total) by mouth every 4 (four) hours as needed. What changed: reasons to take this   amantadine 100 MG capsule Commonly known as: SYMMETREL Take 1 capsule (100 mg total) by mouth 2 (two) times daily.   atorvastatin 80 MG tablet Commonly known as: LIPITOR Take 1 tablet (80 mg total) by  mouth daily.   chlorhexidine 0.12 % solution Commonly known as: PERIDEX Use as directed 15 mLs in the mouth or throat 2 (two) times daily.   clopidogrel 75 MG tablet Commonly known as: PLAVIX Take 1 tablet (75 mg total) by mouth daily.   DULoxetine 60 MG capsule Commonly known as: Cymbalta Take 1 capsule (60 mg total) by mouth daily.   Ensure Plus Liqd Take 237 mLs by mouth 3 (three) times daily between meals.   fluconazole 100 MG tablet Commonly known as: Diflucan Take 1 tablet (100 mg total) by mouth daily for 4 days.   folic acid 1 MG tablet Commonly known as: FOLVITE Take 1 tablet (1 mg total) by mouth daily.   gabapentin 100 MG capsule Commonly known as: NEURONTIN Take 1 capsule (100 mg total) by mouth 2 (two) times daily.   Gerhardt's butt cream Crea Apply 1 application. topically daily as needed for irritation.   ipratropium-albuterol 0.5-2.5 (3) MG/3ML Soln Commonly known as: DUONEB Take 3 mLs by nebulization every 6 (six) hours as needed. What changed: reasons to take this   levofloxacin 500 MG tablet Commonly known as: Levaquin Take 1 tablet (500 mg total) by mouth daily for 10 days.   multivitamin with minerals Tabs tablet Take 1 tablet by mouth daily.   nystatin 100000 UNIT/ML suspension Commonly known as: MYCOSTATIN Take 5 mLs (500,000 Units total) by mouth 4 (four) times daily.   polyethylene glycol powder 17 GM/SCOOP powder Commonly known as: MiraLax Take 17 g by mouth 2 (two) times daily as needed for moderate constipation.   propranolol 20 MG tablet Commonly known as: INDERAL Take 0.5 tablets (10 mg total) by mouth 3 (three) times daily. What changed: how much to take   senna-docusate 8.6-50 MG tablet Commonly known as: Senokot-S Take 1 tablet by mouth 2 (two) times daily between meals as needed for mild constipation.   thiamine 100 MG tablet Commonly known as: VITAMIN B1 Take 1 tablet (100 mg total) by mouth daily.        Follow-up  Information     [facility] Follow up.   Why: Call to schedule transportation---usually requires 72 hour notice Contact information: [phone]        [employer] Follow up.   Why: Call to follow up with caregiver services Contact information: [phone]        [facility] Follow up.   Why: ([facility])- HHPT/aide services to resume- they will contact you to schedule        [facility] accounts/billing Follow up.   Why: may call for questions regarding billing with insurance changes Contact information: [phone]               Allergies  Allergen Reactions   Aspirin Hives and Rash   Codeine Hives and Rash   Penicillins Anaphylaxis, Swelling and Other (See Comments)   Strawberry Extract Anaphylaxis and Hives    Consultations: None   Procedures/Studies: ECHOCARDIOGRAM COMPLETE  Result  Date: 05/27/2022    ECHOCARDIOGRAM REPORT   Patient Name:   Adalberto ColeHELMA Kirkendoll Date of Exam: 05/27/2022 Medical Rec #:  [id number]     Height:       64.0 in Accession #:    [id number]    Weight:       136.7 lb Date of Birth:  1971/07/08    BSA:          1.664 m Patient Age:    50 years      BP:           108/82 mmHg Patient Gender: F             HR:           87 bpm. Exam Location:  Inpatient Procedure: 2D Echo, Cardiac Doppler and Color Doppler Indications:    Elevated troponin  History:        Patient has prior history of Echocardiogram examinations, most                 recent 01/22/2022. TIA and Stroke; Risk Factors:Hypertension.                 Acute renal failure.  Sonographer:    Milda SmartShannon O'Grady Referring Phys: Weston BrassGayatri Acharya MD  Sonographer Comments: Technically difficult study due to poor echo windows and suboptimal apical window. Image acquisition challenging due to patient body habitus and Image acquisition challenging due to respiratory motion. Ordered by Dr. Midge MiniumArshad Kakrakandy. I positioned the patient twice and still could not obtain optimal  diagnostic images. Small effusion noted in the PLAX. IMPRESSIONS  1. Left ventricular ejection fraction, by estimation, is >75%. The left ventricle has hyperdynamic function. The left ventricle has no regional wall motion abnormalities. There is moderate left ventricular hypertrophy. Left ventricular diastolic function could not be evaluated.  2. Right ventricular systolic function was not well visualized. The right ventricular size is not well visualized.  3. A small pericardial effusion is present. The pericardial effusion is circumferential. There is no evidence of cardiac tamponade.  4. The mitral valve is normal in structure. No evidence of mitral valve regurgitation.  5. The aortic valve is tricuspid. Aortic valve regurgitation is not visualized. No aortic stenosis is present.  6. The inferior vena cava is normal in size with greater than 50% respiratory variability, suggesting right atrial pressure of 3 mmHg. Comparison(s): Changes from prior study are noted. 01/22/2022: LVEF 55-60%, small to moderate sized pericardial effusion. FINDINGS  Left Ventricle: Left ventricular ejection fraction, by estimation, is >75%. The left ventricle has hyperdynamic function. The left ventricle has no regional wall motion abnormalities. The left ventricular internal cavity size was normal in size. There is moderate left ventricular hypertrophy. Left ventricular diastolic function could not be evaluated. Right Ventricle: The right ventricular size is not well visualized. Right vetricular wall thickness was not well visualized. Right ventricular systolic function was not well visualized. Left Atrium: Left atrial size was not assessed. Right Atrium: Right atrial size was not assessed. Pericardium: A small pericardial effusion is present. The pericardial effusion is circumferential. There is no evidence of cardiac tamponade. Mitral Valve: The mitral valve is normal in structure. No evidence of mitral valve regurgitation. Tricuspid  Valve: The tricuspid valve is not well visualized. Tricuspid valve regurgitation is not demonstrated. Aortic Valve: The aortic valve is tricuspid. Aortic valve regurgitation is not visualized. No aortic stenosis is present. Pulmonic Valve: The pulmonic valve was grossly normal. Pulmonic valve regurgitation is trivial. Aorta: The aortic  root and ascending aorta are structurally normal, with no evidence of dilitation. Venous: The inferior vena cava is normal in size with greater than 50% respiratory variability, suggesting right atrial pressure of 3 mmHg. IAS/Shunts: The interatrial septum was not well visualized.  LEFT VENTRICLE PLAX 2D LVIDd:         3.30 cm LVIDs:         1.70 cm LV PW:         1.40 cm LV IVS:        1.30 cm LVOT diam:     1.90 cm LVOT Area:     2.84 cm  LEFT ATRIUM         Index LA diam:    2.50 cm 1.50 cm/m   AORTA Ao Root diam: 3.00 cm Ao Asc diam:  2.90 cm  SHUNTS Systemic Diam: 1.90 cm Zoila Shutter MD Electronically signed by Zoila Shutter MD Signature Date/Time: 05/27/2022/4:47:23 PM    Final    CT HEAD WO CONTRAST ( )  Result Date: 05/26/2022 CLINICAL DATA:  Altered mental status. EXAM: CT HEAD WITHOUT CONTRAST TECHNIQUE: Contiguous axial images were obtained from the base of the skull through the vertex without intravenous contrast. RADIATION DOSE REDUCTION: This exam was performed according to the departmental dose-optimization program which includes automated exposure control, adjustment of the mA and/or kV according to patient size and/or use of iterative reconstruction technique. COMPARISON:  April 19, 2022 FINDINGS: Brain: No evidence of acute infarction, hemorrhage, hydrocephalus, extra-axial collection or mass lesion/mass effect. 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 Mild, stable diffuse white matter low attenuation is again seen. Small, chronic bilateral para thalamic and basal ganglia lacunar infarcts are noted. Vascular: No hyperdense vessel or  unexpected calcification. Skull: Normal. Negative for fracture or focal lesion. Sinuses/Orbits: No acute finding. Other: None. IMPRESSION: 1. No acute intracranial abnormality. 2. Mild, chronic small vessel ischemic disease. 3. Small, chronic bilateral para thalamic and basal ganglia lacunar infarcts. Electronically Signed   By: Aram Candela M.D.   On: 05/26/2022 04:37   CT RENAL STONE STUDY  Result Date: 05/25/2022 CLINICAL DATA:  Flank pain, concern for kidney stone EXAM: CT ABDOMEN AND PELVIS WITHOUT CONTRAST TECHNIQUE: Multidetector CT imaging of the abdomen and pelvis was performed following the standard protocol without IV contrast. RADIATION DOSE REDUCTION: This exam was performed according to the departmental dose-optimization program which includes automated exposure control, adjustment of the mA and/or kV according to patient size and/or use of iterative reconstruction technique. COMPARISON:  CT abdomen and pelvis 07/04/2005 and radiographs 05/12/2022 FINDINGS: Lower chest: Respiratory motion obscures the lung bases. Cardiomegaly. Small pericardial effusion. Hepatobiliary: No suspicious focal liver abnormality is seen. No gallstones, gallbladder wall thickening, or biliary dilatation. Pancreas: Unremarkable. No pancreatic ductal dilatation or surrounding inflammatory changes. Spleen: Normal in size without focal abnormality. Adrenals/Urinary Tract: Adrenal glands are unremarkable. Kidneys are normal, without renal calculi, suspicious focal lesion, or hydronephrosis. Foley catheter in decompressed bladder. Stomach/Bowel: Stomach is within normal limits. The appendix is normal. No evidence of bowel wall thickening, distention, or inflammatory changes. Vascular/Lymphatic: No significant vascular findings are present. No enlarged abdominal or pelvic lymph nodes. Reproductive: Unremarkable. Other: No free intraperitoneal fluid or air. Musculoskeletal: No acute or significant osseous findings.  IMPRESSION: Unremarkable noncontrast CT abdomen and pelvis. Electronically Signed   By: Minerva Fester M.D.   On: 05/25/2022 23:58   DG Chest Port 1 View  Result Date: 05/25/2022 CLINICAL DATA:  Concern for sepsis. EXAM: PORTABLE  CHEST 1 VIEW COMPARISON:  Chest radiograph May 12, 2022 FINDINGS: The heart size and mediastinal contours are within normal limits. No focal airspace consolidation. No visible pleural effusion or pneumothorax. Degenerative changes bilateral shoulders. IMPRESSION: No acute cardiopulmonary disease. Electronically Signed   By: Maudry Mayhew M.D.   On: 05/25/2022 16:18   DG ABD ACUTE 2+V W 1V CHEST  Result Date: 05/12/2022 CLINICAL DATA:  Recent gastrostomy displacement EXAM: DG ABDOMEN ACUTE WITH 1 VIEW CHEST COMPARISON:  04/19/2022 FINDINGS: Scattered bowel gas is noted. No obstructive changes are seen. No free air is noted. No bony abnormality is seen. Cardiac shadow is prominent but stable. Lungs are clear bilaterally. No bony abnormality is seen. IMPRESSION: No acute abnormality in the chest and abdomen. Electronically Signed   By: Alcide Clever M.D.   On: 05/12/2022 02:19   (Echo, Carotid, EGD, Colonoscopy, ERCP)    Subjective: Patient is resting in bed in no acute distress when called her name she looks at me however she is nonverbal which is chronic  Discharge Exam: Vitals:   05/28/22 0514 05/28/22 0854  BP: 125/84 (!) 123/90  Pulse: 78 95  Resp: 18 16  Temp: 98.5 F (36.9 C)   SpO2: 92% 100%   Vitals:   05/27/22 1724 05/27/22 2054 05/28/22 0514 05/28/22 0854  BP: (!) 88/66 95/69 125/84 (!) 123/90  Pulse: 83 71 78 95  Resp: Temp: 97.6 F (36.4 C) 98.3 F (36.8 C) 98.5 F (36.9 C)   TempSrc: Oral  Oral   SpO2: 98% 96% 92% 100%  Weight:      Height:        General: Pt is alert, awake, not in acute distress Cardiovascular: RRR, S1/S2 +, no rubs, no gallops Respiratory: CTA bilaterally, no wheezing, no rhonchi Abdominal: Soft, NT,  ND, bowel sounds + Extremities: 1+ edema  The results of significant diagnostics from this hospitalization (including imaging, microbiology, ancillary and laboratory) are listed below for reference.     Microbiology: Recent Results (from the past 240 hour(s))  Culture, blood (routine x 2)     Status: None (Preliminary result)   Collection Time: 05/25/22  3:36 PM   Specimen: BLOOD LEFT ARM  Result Value Ref Range Status   Specimen Description BLOOD LEFT ARM  Final   Special Requests   Final    BOTTLES DRAWN AEROBIC AND ANAEROBIC Blood Culture results may not be optimal due to an inadequate volume of blood received in culture bottles   Culture   Final    NO GROWTH 3 DAYS Performed at [facility], [address], [place], [place] [address]    Report Status PENDING  Incomplete  Resp Panel by RT-PCR (Flu A&B, Covid) Anterior Nasal Swab     Status: None   Collection Time: 05/25/22  5:51 PM   Specimen: Anterior Nasal Swab  Result Value Ref Range Status   SARS Coronavirus 2 by RT PCR NEGATIVE NEGATIVE Final    Comment: (NOTE) SARS-CoV-2 target nucleic acids are NOT DETECTED.  The SARS-CoV-2 RNA is generally detectable in upper respiratory specimens during the acute phase of infection. The lowest concentration of SARS-CoV-2 viral copies this assay can detect is 138 copies/mL. A negative result does not preclude SARS-Cov-2 infection and should not be used as the sole basis for treatment or other patient management decisions. A negative result may occur with  improper specimen collection/handling, submission of specimen other than nasopharyngeal swab, presence of viral mutation(s)  within the areas targeted by this assay, and inadequate number of viral copies(<138 copies/mL). A negative result must be combined with clinical observations, patient history, and epidemiological information. The expected result is Negative.  Fact Sheet for Patients:   BloggerCourse.com  Fact Sheet for Healthcare Providers:  SeriousBroker.it  This test is no t yet approved or cleared by the [place] FDA and  has been authorized for detection and/or diagnosis of SARS-CoV-2 by FDA under an Emergency Use Authorization (EUA). This EUA will remain  in effect (meaning this test can be used) for the duration of the COVID-19 declaration under Section 564(b)(1) of the Act, 21 U.S.C.section 360bbb-3(b)(1), unless the authorization is terminated  or revoked sooner.       Influenza A by PCR NEGATIVE NEGATIVE Final   Influenza B by PCR NEGATIVE NEGATIVE Final    Comment: (NOTE) The Xpert Xpress SARS-CoV-2/FLU/RSV plus assay is intended as an aid in the diagnosis of influenza from Nasopharyngeal swab specimens and should not be used as a sole basis for treatment. Nasal washings and aspirates are unacceptable for Xpert Xpress SARS-CoV-2/FLU/RSV testing.  Fact Sheet for Patients: BloggerCourse.com  Fact Sheet for Healthcare Providers: SeriousBroker.it  This test is not yet approved or cleared by the [place] FDA and has been authorized for detection and/or diagnosis of SARS-CoV-2 by FDA under an Emergency Use Authorization (EUA). This EUA will remain in effect (meaning this test can be used) for the duration of the COVID-19 declaration under Section 564(b)(1) of the Act, 21 U.S.C. section 360bbb-3(b)(1), unless the authorization is terminated or revoked.  Performed at [facility], [address], [place], [place] [address]   Culture, blood (routine x 2)     Status: None (Preliminary result)   Collection Time: 05/25/22  9:37 PM   Specimen: BLOOD  Result Value Ref Range Status   Specimen Description BLOOD RIGHT FOREARM  Final   Special Requests   Final    BOTTLES DRAWN AEROBIC AND ANAEROBIC Blood Culture adequate volume   Culture    Final    NO GROWTH 3 DAYS Performed at [facility], [address] [place], [place] [address]    Report Status PENDING  Incomplete  Urine Culture     Status: Abnormal   Collection Time: 05/25/22  9:55 PM   Specimen: Urine, Catheterized  Result Value Ref Range Status   Specimen Description URINE, CATHETERIZED  Final   Special Requests   Final    NONE Performed at [facility], [address] [place], [place] [address]    Culture (A)  Final    >=100,000 COLONIES/mL ENTEROCOCCUS FAECALIS 80,000 COLONIES/mL PROTEUS MIRABILIS    Report Status 05/28/2022 FINAL  Final   Organism ID, Bacteria ENTEROCOCCUS FAECALIS (A)  Final   Organism ID, Bacteria PROTEUS MIRABILIS (A)  Final      Susceptibility   Enterococcus faecalis - MIC*    AMPICILLIN <=2 SENSITIVE Sensitive     NITROFURANTOIN <=16 SENSITIVE Sensitive     VANCOMYCIN 2 SENSITIVE Sensitive     * >=100,000 COLONIES/mL ENTEROCOCCUS FAECALIS   Proteus mirabilis - MIC*    AMPICILLIN <=2 SENSITIVE Sensitive     CEFAZOLIN <=4 SENSITIVE Sensitive     CEFEPIME <=0.12 SENSITIVE Sensitive     CEFTRIAXONE <=0.25 SENSITIVE Sensitive     CIPROFLOXACIN <=0.25 SENSITIVE Sensitive     GENTAMICIN <=1 SENSITIVE Sensitive     IMIPENEM 2 SENSITIVE Sensitive     NITROFURANTOIN 128 RESISTANT  Resistant     TRIMETH/SULFA <=20 SENSITIVE Sensitive     AMPICILLIN/SULBACTAM <=2 SENSITIVE Sensitive     PIP/TAZO <=4 SENSITIVE Sensitive     * 80,000 COLONIES/mL PROTEUS MIRABILIS     Labs: BNP (last 3 results) No results for input(s): "BNP" in the last 8760 hours. Basic Metabolic Panel: Recent Labs  Lab 05/26/22 0705 05/26/22 1427 05/26/22 1856 05/27/22 0824 05/28/22 1208  NA 158* 155* 154* 150* 143  K 2.9* 3.2* 3.3* 4.0 4.3  CL 127* 127* 126* 125* 118*  CO2 19* 19*  GLUCOSE 171* 172* 118* 115* 115*  BUN 39* 32* 30* 20 10  CREATININE 1.77* 1.40* 1.36* 1.05* 0.85  CALCIUM 8.4* 8.2* 8.6* 8.6* 8.5*  MG  --   --  1.7 1.6*   --    Liver Function Tests: Recent Labs  Lab 05/25/22 1820 05/26/22 0019 05/27/22 0824  AST ALT ALKPHOS 98 69 69  BILITOT 1.2 1.1 0.5  PROT 9.7* 5.8* 5.4*  ALBUMIN 4.7 2.9* 2.7*   Recent Labs  Lab 05/25/22 1820  LIPASE 44   No results for input(s): "AMMONIA" in the last 168 hours. CBC: Recent Labs  Lab 05/25/22 1820 05/26/22 0109 05/26/22 0705 05/26/22 1508 05/27/22 0824 05/28/22 1208  WBC 13.1* 17.9* 19.1* 15.6* 9.8 8.7  NEUTROABS 9.3* 14.4*  --   --   --   --   HGB 16.2* 11.5* 10.2* 10.0* 10.7* 10.8*  HCT 50.5* 35.6* 31.9* 31.4* 33.7* 32.8*  MCV 82.9 83.8 83.7 83.3 83.8 81.6  PLT 230 143* 129* 125* 122* 118*   Cardiac Enzymes: No results for input(s): "CKTOTAL", "CKMB", "CKMBINDEX", "TROPONINI" in the last 168 hours. BNP: Invalid input(s): "POCBNP" CBG: Recent Labs  Lab 05/27/22 1659  GLUCAP 127*   D-Dimer No results for input(s): "DDIMER" in the last 72 hours. Hgb A1c No results for input(s): "HGBA1C" in the last 72 hours. Lipid Profile No results for input(s): "CHOL", "HDL", "LDLCALC", "TRIG", "CHOLHDL", "LDLDIRECT" in the last 72 hours. Thyroid function studies No results for input(s): "TSH", "T4TOTAL", "T3FREE", "THYROIDAB" in the last 72 hours.  Invalid input(s): "FREET3" Anemia work up No results for input(s): "VITAMINB12", "FOLATE", "FERRITIN", "TIBC", "IRON", "RETICCTPCT" in the last 72 hours. Urinalysis    Component Value Date/Time   COLORURINE AMBER (A) 05/25/2022 2155   APPEARANCEUR TURBID (A) 05/25/2022 2155   LABSPEC 1.014 05/25/2022 2155   PHURINE 8.0 05/25/2022 2155   GLUCOSEU NEGATIVE 05/25/2022 2155   HGBUR SMALL (A) 05/25/2022 2155   BILIRUBINUR NEGATIVE 05/25/2022 2155   KETONESUR 5 (A) 05/25/2022 2155   PROTEINUR 100 (A) 05/25/2022 2155   NITRITE NEGATIVE 05/25/2022 2155   LEUKOCYTESUR LARGE (A) 05/25/2022 2155   Sepsis Labs Recent Labs  Lab 05/26/22 0705 05/26/22 1508 05/27/22 0824  05/28/22 1208  WBC 19.1* 15.6* 9.8 8.7   Microbiology Recent Results (from the past 240 hour(s))  Culture, blood (routine x 2)     Status: None (Preliminary result)   Collection Time: 05/25/22  3:36 PM   Specimen: BLOOD LEFT ARM  Result Value Ref Range Status   Specimen Description BLOOD LEFT ARM  Final   Special Requests   Final    BOTTLES DRAWN AEROBIC AND ANAEROBIC Blood Culture results may not be optimal due to an inadequate volume of blood received in culture bottles   Culture   Final    NO GROWTH 3 DAYS Performed at [facility], [address], [place], [place] [address]    Report Status PENDING  Incomplete  Resp Panel by RT-PCR (Flu A&B, Covid) Anterior Nasal Swab     Status: None   Collection Time: 05/25/22  5:51 PM   Specimen: Anterior Nasal Swab  Result Value Ref Range Status   SARS Coronavirus 2 by RT PCR NEGATIVE NEGATIVE Final    Comment: (NOTE) SARS-CoV-2 target nucleic acids are NOT DETECTED.  The SARS-CoV-2 RNA is generally detectable in upper respiratory specimens during the acute phase of infection. The lowest concentration of SARS-CoV-2 viral copies this assay can detect is 138 copies/mL. A negative result does not preclude SARS-Cov-2 infection and should not be used as the sole basis for treatment or other patient management decisions. A negative result may occur with  improper specimen collection/handling, submission of specimen other than nasopharyngeal swab, presence of viral mutation(s) within the areas targeted by this assay, and inadequate number of viral copies(<138 copies/mL). A negative result must be combined with clinical observations, patient history, and epidemiological information. The expected result is Negative.  Fact Sheet for Patients:  BloggerCourse.com  Fact Sheet for Healthcare Providers:  SeriousBroker.it  This test is no t yet approved or cleared by the [place] FDA and   has been authorized for detection and/or diagnosis of SARS-CoV-2 by FDA under an Emergency Use Authorization (EUA). This EUA will remain  in effect (meaning this test can be used) for the duration of the COVID-19 declaration under Section 564(b)(1) of the Act, 21 U.S.C.section 360bbb-3(b)(1), unless the authorization is terminated  or revoked sooner.       Influenza A by PCR NEGATIVE NEGATIVE Final   Influenza B by PCR NEGATIVE NEGATIVE Final    Comment: (NOTE) The Xpert Xpress SARS-CoV-2/FLU/RSV plus assay is intended as an aid in the diagnosis of influenza from Nasopharyngeal swab specimens and should not be used as a sole basis for treatment. Nasal washings and aspirates are unacceptable for Xpert Xpress SARS-CoV-2/FLU/RSV testing.  Fact Sheet for Patients: BloggerCourse.com  Fact Sheet for Healthcare Providers: SeriousBroker.it  This test is not yet approved or cleared by the [place] FDA and has been authorized for detection and/or diagnosis of SARS-CoV-2 by FDA under an Emergency Use Authorization (EUA). This EUA will remain in effect (meaning this test can be used) for the duration of the COVID-19 declaration under Section 564(b)(1) of the Act, 21 U.S.C. section 360bbb-3(b)(1), unless the authorization is terminated or revoked.  Performed at [facility], [address], [place], [place] [address]   Culture, blood (routine x 2)     Status: None (Preliminary result)   Collection Time: 05/25/22  9:37 PM   Specimen: BLOOD  Result Value Ref Range Status   Specimen Description BLOOD RIGHT FOREARM  Final   Special Requests   Final    BOTTLES DRAWN AEROBIC AND ANAEROBIC Blood Culture adequate volume   Culture   Final    NO GROWTH 3 DAYS Performed at [facility], [address] [place], [place] [address]    Report Status PENDING  Incomplete  Urine Culture     Status: Abnormal   Collection Time:  05/25/22  9:55 PM   Specimen: Urine, Catheterized  Result Value Ref Range Status   Specimen Description URINE, CATHETERIZED  Final   Special Requests   Final    NONE Performed at [facility], [address] [place], [place] [address]    Culture (A)  Final    >=  100,000 COLONIES/mL ENTEROCOCCUS FAECALIS 80,000 COLONIES/mL PROTEUS MIRABILIS    Report Status 05/28/2022 FINAL  Final   Organism ID, Bacteria ENTEROCOCCUS FAECALIS (A)  Final   Organism ID, Bacteria PROTEUS MIRABILIS (A)  Final      Susceptibility   Enterococcus faecalis - MIC*    AMPICILLIN <=2 SENSITIVE Sensitive     NITROFURANTOIN <=16 SENSITIVE Sensitive     VANCOMYCIN 2 SENSITIVE Sensitive     * >=100,000 COLONIES/mL ENTEROCOCCUS FAECALIS   Proteus mirabilis - MIC*    AMPICILLIN <=2 SENSITIVE Sensitive     CEFAZOLIN <=4 SENSITIVE Sensitive     CEFEPIME <=0.12 SENSITIVE Sensitive     CEFTRIAXONE <=0.25 SENSITIVE Sensitive     CIPROFLOXACIN <=0.25 SENSITIVE Sensitive     GENTAMICIN <=1 SENSITIVE Sensitive     IMIPENEM 2 SENSITIVE Sensitive     NITROFURANTOIN 128 RESISTANT Resistant     TRIMETH/SULFA <=20 SENSITIVE Sensitive     AMPICILLIN/SULBACTAM <=2 SENSITIVE Sensitive     PIP/TAZO <=4 SENSITIVE Sensitive     * 80,000 COLONIES/mL PROTEUS MIRABILIS     Time coordinating discharge: 39 minutes  SIGNED:  Alwyn Ren, MD  [facility] 05/28/2022, 2:56 PM

## 2022-05-28 NOTE — TOC Transition Note (Signed)
Transition of Care (TOC) - CM/SW Discharge Note Donn Pierini RN, BSN Transitions of Care Unit 4E- RN Case Manager See Treatment Team for direct phone #  Cross coverage for 87M  Patient Details  Name: Cindy Brooks MRN: [id number] Date of Birth: September 08, 1971  Transition of Care [facility]) CM/SW Contact:  Darrold Span, RN Phone Number: 05/28/2022, 2:27 PM   Clinical Narrative:    Pt stable for transition home today. Pt active w/ [facility] for HHPT/aide- spoke with Morrie Sheldon at [facility] and they can continue services and also request HHRN be added to resumption orders. Msg sent to MD and orders received for HHRN/PT/aide.   Pt to transport home via [facility]. Call made to daughter to confirm address, also let daughter know that [facility] services would resume for now on discharge. Daughter asking about billing Medicaid - provided account/billing # on AVS for daughter to call and discuss past bills, etc with them. Daughter confirms they have needed DME in the home.   [facility] called for transport, per dispatch pt has been placed on list with ETA couple of hours. Paperwork placed on unit shadow chart. Msg sent to bedside RN.    Final next level of care: Home w Home Health Services Barriers to Discharge: Barriers Resolved   Patient Goals and CMS Choice Patient states their goals for this hospitalization and ongoing recovery are:: return home (per daughter) CMS Medicare.gov Compare Post Acute Care list provided to:: Patient Represenative (must comment) Choice offered to / list presented to :  (daughter)  Discharge Placement               Home w/ [facility]        Discharge Plan and Services   Discharge Planning Services: CM Consult Post Acute Care Choice: Home Health, Resumption of Svcs/PTA Provider          DME Arranged: N/A DME Agency: NA       HH Arranged: RN, PT, Nurse's Aide HH Agency: [facility] Date HH Agency Contacted: 05/28/22 Time HH Agency Contacted:  1400 Representative spoke with at [facility] Agency: Morrie Sheldon  Social Determinants of Health (SDOH) Interventions     Readmission Risk Interventions    05/28/2022    2:27 PM 03/13/2022    9:03 AM  Readmission Risk Prevention Plan  Transportation Screening Complete Complete  PCP or Specialist Appt within 5-7 Days  Complete  PCP or Specialist Appt within 3-5 Days Complete   Home Care Screening  Complete  Medication Review (RN CM)  Complete  HRI or Home Care Consult Complete   Social Work Consult for Recovery Care Planning/Counseling Complete   Palliative Care Screening Not Applicable   Medication Review Oceanographer) Complete

## 2022-05-30 ENCOUNTER — Telehealth: Payer: Self-pay | Admitting: Family Medicine

## 2022-05-30 ENCOUNTER — Telehealth: Payer: Self-pay

## 2022-05-30 LAB — CULTURE, BLOOD (ROUTINE X 2)
Culture: NO GROWTH
Culture: NO GROWTH
Special Requests: ADEQUATE

## 2022-05-30 NOTE — Telephone Encounter (Signed)
Transition Care Management Follow-up Telephone Call  Call completed with patient's daughter, Cindy Brooks. Date of discharge and from where: 05/28/2022, [facility] How have you been since you were released from the hospital? She stated that her mother is doing okay  Any questions or concerns? Yes - foley catheter was changed in the hospital on 05/25/2022.  She has not been referred to urology for follow up and foley change. Her daughter was not sure if the home health nurse will be changing the foley.   Items Reviewed: Did the pt receive and understand the discharge instructions provided? Yes  Medications obtained and verified? Yes -her daughter said that they have all of her medications and she did not have any questions about the med regime.  Other? No  Any new allergies since your discharge? No  Dietary orders reviewed? No Do you have support at home? Yes   Home Care and Equipment/Supplies: Were home health services ordered? yes If so, what is the name of the agency? [facility]  Has the agency set up a time to come to the patient's home? Yes - the nurse saw her yesterday.  Were any new equipment or medical supplies ordered?  No What is the name of the medical supply agency? N/a Were you able to get the supplies/equipment? not applicable Do you have any questions related to the use of the equipment or supplies? No  Referral was made for [facility] when she was in the hospital.  She may also benefit from CAP services.   Functional Questionnaire: (I = Independent and D = Dependent) ADLs: dependent,currently bedbound, 2 person transfer bed to wheelchair. Her daughter said that   She does not have a hospital bed.  They are currently purchasing briefs for her.    Follow up appointments reviewed:  PCP Hospital f/u appt confirmed? Yes  Scheduled to see Dr Alvis Lemmings- 06/24/2022. Specialist Hospital f/u appt confirmed? Yes  Scheduled to see neurology- 06/20/2022 Are transportation  arrangements needed? Yes - her daughter has the phone number to call [employer] to schedule rides for her mother If their condition worsens, is the pt aware to call PCP or go to the Emergency Dept.? Yes Was the patient provided with contact information for the PCP's office or ED? Yes Was to pt encouraged to call back with questions or concerns? Yes

## 2022-05-30 NOTE — Telephone Encounter (Signed)
From the discharge call:  Call completed with patient's daughter, [name].  She stated that her mother is doing okay   foley catheter was changed in the hospital on 05/25/2022.  She has not been referred to urology for follow up and foley change. Her daughter was not sure if the home health nurse will be changing the foley.   her daughter said that they have all of her medications and she did not have any questions about the med regime.   Referral was made for [facility] when she was in the hospital.  She may also benefit from CAP services.   dependent,currently bedbound, 2 person transfer bed to wheelchair.   She does not have a hospital bed.   They are currently purchasing briefs for her  Scheduled to see Dr Alvis Lemmings- 06/24/2022.her daughter has the phone number to call [employer] to schedule rides for her mother

## 2022-05-30 NOTE — Telephone Encounter (Signed)
Home Health Verbal Orders - Caller/Agency: Lorena from [facility] Callback Number: [phone] Requesting OT/PT/Skilled Nursing/Speech Therapy/home health aide Frequency: OT and PT evaluation/skilled nursing 1x9/2 prn/

## 2022-05-31 NOTE — Telephone Encounter (Signed)
I placed a Urology referral at her visit with me on 05/14/22.

## 2022-05-31 NOTE — Telephone Encounter (Signed)
Verbal orders given back to Lorena from [facility].

## 2022-06-04 ENCOUNTER — Telehealth: Payer: Self-pay | Admitting: Emergency Medicine

## 2022-06-04 NOTE — Telephone Encounter (Signed)
I called her daughter, [name], and informed her that Dr Alvis Lemmings placed a referral to urology on 05/14/2022 and [facility] should be contacting her to schedule an appointment.  [name] said her mother is doing "good" and she was appreciative of the referral.

## 2022-06-04 NOTE — Telephone Encounter (Signed)
Copied from CRM [id number]. Topic: General - Other >> Jun 04, 2022 11:10 AM Tiffany B wrote: Requesting verbal orders for speech therapy, 1x 8 addressing cogntion and swallowing.

## 2022-06-05 NOTE — Telephone Encounter (Signed)
VM left to return phone call.

## 2022-06-14 ENCOUNTER — Telehealth: Payer: Self-pay | Admitting: *Deleted

## 2022-06-14 ENCOUNTER — Inpatient Hospital Stay: Payer: No Typology Code available for payment source | Attending: Family | Admitting: Family

## 2022-06-14 ENCOUNTER — Encounter: Payer: Self-pay | Admitting: Family

## 2022-06-14 ENCOUNTER — Inpatient Hospital Stay: Payer: No Typology Code available for payment source

## 2022-06-14 VITALS — BP 115/85 | HR 93 | Temp 97.5°F | Resp 20

## 2022-06-14 DIAGNOSIS — R768 Other specified abnormal immunological findings in serum: Secondary | ICD-10-CM | POA: Insufficient documentation

## 2022-06-14 DIAGNOSIS — D6861 Antiphospholipid syndrome: Secondary | ICD-10-CM | POA: Insufficient documentation

## 2022-06-14 DIAGNOSIS — I63513 Cerebral infarction due to unspecified occlusion or stenosis of bilateral middle cerebral arteries: Secondary | ICD-10-CM

## 2022-06-14 DIAGNOSIS — I639 Cerebral infarction, unspecified: Secondary | ICD-10-CM

## 2022-06-14 DIAGNOSIS — Z8673 Personal history of transient ischemic attack (TIA), and cerebral infarction without residual deficits: Secondary | ICD-10-CM | POA: Diagnosis not present

## 2022-06-14 LAB — CBC WITH DIFFERENTIAL (CANCER CENTER ONLY)
Abs Immature Granulocytes: 0.01 10*3/uL (ref 0.00–0.07)
Basophils Absolute: 0 10*3/uL (ref 0.0–0.1)
Basophils Relative: 1 %
Eosinophils Absolute: 0.1 10*3/uL (ref 0.0–0.5)
Eosinophils Relative: 1 %
HCT: 33.5 % — ABNORMAL LOW (ref 36.0–46.0)
Hemoglobin: 11.1 g/dL — ABNORMAL LOW (ref 12.0–15.0)
Immature Granulocytes: 0 %
Lymphocytes Relative: 39 %
Lymphs Abs: 2.3 10*3/uL (ref 0.7–4.0)
MCH: 26.4 pg (ref 26.0–34.0)
MCHC: 33.1 g/dL (ref 30.0–36.0)
MCV: 79.8 fL — ABNORMAL LOW (ref 80.0–100.0)
Monocytes Absolute: 0.4 10*3/uL (ref 0.1–1.0)
Monocytes Relative: 7 %
Neutro Abs: 3.1 10*3/uL (ref 1.7–7.7)
Neutrophils Relative %: 52 %
Platelet Count: 244 10*3/uL (ref 150–400)
RBC: 4.2 MIL/uL (ref 3.87–5.11)
RDW: 16.4 % — ABNORMAL HIGH (ref 11.5–15.5)
WBC Count: 6 10*3/uL (ref 4.0–10.5)
nRBC: 0 % (ref 0.0–0.2)

## 2022-06-14 LAB — CMP (CANCER CENTER ONLY)
ALT: 10 U/L (ref 0–44)
AST: 13 U/L — ABNORMAL LOW (ref 15–41)
Albumin: 3.8 g/dL (ref 3.5–5.0)
Alkaline Phosphatase: 104 U/L (ref 38–126)
Anion gap: 10 (ref 5–15)
BUN: 8 mg/dL (ref 6–20)
CO2: 30 mmol/L (ref 22–32)
Calcium: 10 mg/dL (ref 8.9–10.3)
Chloride: 102 mmol/L (ref 98–111)
Creatinine: 0.86 mg/dL (ref 0.44–1.00)
GFR, Estimated: >60 mL/min (ref >60)
Glucose, Bld: 111 mg/dL — ABNORMAL HIGH (ref 70–99)
Potassium: 3.1 mmol/L — ABNORMAL LOW (ref 3.5–5.1)
Sodium: 142 mmol/L (ref 135–145)
Total Bilirubin: 0.9 mg/dL (ref 0.3–1.2)
Total Protein: 7.5 g/dL (ref 6.5–8.1)

## 2022-06-14 LAB — LACTATE DEHYDROGENASE: LDH: 213 U/L — ABNORMAL HIGH (ref 98–192)

## 2022-06-14 LAB — ANTITHROMBIN III: AntiThromb III Func: 118 % (ref 75–120)

## 2022-06-14 LAB — D-DIMER, QUANTITATIVE: D-Dimer, Quant: 1.42 ug/mL-FEU — ABNORMAL HIGH (ref 0.00–0.50)

## 2022-06-14 NOTE — Telephone Encounter (Signed)
Per 06/14/22 los - Pending lab work up.

## 2022-06-14 NOTE — Progress Notes (Signed)
Hematology/Oncology Consultation   Name: Cindy Brooks      MRN: [id number]    Location: Room/bed info not found  Date: 06/14/2022 Time:1:26 PM   REFERRING PHYSICIAN:  Hoy Register, MD  REASON FOR CONSULT: Antiphospholipid antibody with hypercoagulable state    DIAGNOSIS: Hyper coag panel pending  HISTORY OF PRESENT ILLNESS: Cindy Brooks is a 51 yo [place] American female who had a stroke in April with history of TIA and HTN. MRI of brain was " concerning for bilateral thalamic, midbrain, multiple punctate acute to early subacute bilateral cerebral infarct, multiple scattered chronic microhemorrhages about the deep gray nuclei and cerebellum and underlying chronic microvascular ischemic disease.  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 PCP is concerned patient may have underlying antiphospholipid antibody syndrome.  She was eventually weaned from the vent had had her trach removed.  She has a urinary catheter in place and no new UTI per daughter.  Patient lethargic which daughter states is now her baseline. All health information obtained from daughter.  She now lives with her daughter and her son also assists with her care as well.  CT angio in April and June negative. Bilateral lower extremity US negative in April.  She is currently on Plavix. No issue with blood loss. No bruising or petechiae.  No history of blood clot in the past.  Daughter states that patient will sleep all day and stay up all night. She states that speech is still slurred.  HTN has been better controlled on her current medication regimen. BP 115/85 and HR 93.  She has history of ETOH abuse. Per daughter patient would drink a case of beer a day and occasionally a liquor drink. She stopped at time of  stroke.  No smoking or recreational drug use.  She is able to eat soft foods and liquids. No issues with choking at this time. She does do her best to stay hydrated throughout the day. Unable to stand for weight.  She is in PT/OT and will be starting speech therapy as well.  She is quite weak and unable to walk at this time.  No personal history of cancer. Mother had bone cancer.  He father passed away from an aneurysm.  No known surgical history.  No history of diabetes or thyroid disease.  No fever, chills, n/v, cough, rash, dizziness, SOB, chest pain, palpitations, abdominal pain or changes in bowel or bladder habits.  No swelling, numbness or tingling in her extremities.  She does c/o pain in both knees due to arthritis.   ROS: All other 10 point review of systems is negative.   PAST MEDICAL HISTORY:   Past Medical History:  Diagnosis Date   Hypertension    Hypertensive urgency 01/20/2022   Seizure (HCC) 01/28/2022   Stroke (HCC)    TIA (transient ischemic attack) 2019   Urinary tract infection 01/28/2022    ALLERGIES: Allergies  Allergen Reactions   Aspirin Hives and Rash   Codeine Hives and Rash   Penicillins Anaphylaxis, Swelling and Other (See Comments)   Strawberry Extract Anaphylaxis and Hives      MEDICATIONS:  Current Outpatient Medications on File Prior to Visit  Medication Sig Dispense Refill   acetaminophen (TYLENOL) 325 MG tablet Take 2 tablets (650 mg total) by mouth  every 4 (four) hours as needed. (Patient taking differently: Take 650 mg by mouth every 4 (four) hours as needed for mild pain.) 100 tablet 2   atorvastatin (LIPITOR) 80 MG tablet Take 1 tablet (80 mg total) by mouth daily. 30 tablet 3   chlorhexidine (PERIDEX) 0.12 % solution Use as directed 15 mLs in the mouth or throat 2 (two) times daily. 120 mL 2   clopidogrel (PLAVIX) 75 MG tablet Take 1 tablet (75 mg total) by mouth daily. 30 tablet 1   DULoxetine (CYMBALTA) 60 MG capsule Take 1 capsule  (60 mg total) by mouth daily. 30 capsule 3   Ensure Plus (ENSURE PLUS) LIQD Take 237 mLs by mouth 3 (three) times daily between meals.     folic acid (FOLVITE) 1 MG tablet Take 1 tablet (1 mg total) by mouth daily. 30 tablet 1   gabapentin (NEURONTIN) 100 MG capsule Take 1 capsule (100 mg total) by mouth 2 (two) times daily. 60 capsule 3   Multiple Vitamin (MULTIVITAMIN WITH MINERALS) TABS tablet Take 1 tablet by mouth daily.     Nystatin (GERHARDT'S BUTT CREAM) CREA Apply 1 application. topically daily as needed for irritation.     nystatin (MYCOSTATIN) 100000 UNIT/ML suspension Take 5 mLs (500,000 Units total) by mouth 4 (four) times daily. 60 mL 2   propranolol (INDERAL) 20 MG tablet Take 0.5 tablets (10 mg total) by mouth 3 (three) times daily. 90 tablet 0   senna-docusate (SENOKOT-S) 8.6-50 MG tablet Take 1 tablet by mouth 2 (two) times daily between meals as needed for mild constipation. 180 tablet 0   thiamine 100 MG tablet Take 1 tablet (100 mg total) by mouth daily. 30 tablet 1   amantadine (SYMMETREL) 100 MG capsule Take 1 capsule (100 mg total) by mouth 2 (two) times daily. 60 capsule 1   ipratropium-albuterol (DUONEB) 0.5-2.5 (3) MG/3ML SOLN Take 3 mLs by nebulization every 6 (six) hours as needed. (Patient not taking: Reported on 06/14/2022) 360 mL 1   polyethylene glycol powder (MIRALAX) 17 GM/SCOOP powder Take 17 g by mouth 2 (two) times daily as needed for moderate constipation. (Patient not taking: Reported on 06/14/2022) 255 g 0   No current facility-administered medications on file prior to visit.     PAST SURGICAL HISTORY Past Surgical History:  Procedure Laterality Date   ESOPHAGOGASTRODUODENOSCOPY N/A 01/28/2022   Procedure: ESOPHAGOGASTRODUODENOSCOPY (EGD);  Surgeon: Diamantina Monks, MD;  Location: [facility] ENDOSCOPY;  Service: General;  Laterality: N/A;   PEG PLACEMENT N/A 01/28/2022   Procedure: PERCUTANEOUS ENDOSCOPIC GASTROSTOMY (PEG) PLACEMENT;  Surgeon: Diamantina Monks, MD;   Location: [facility] ENDOSCOPY;  Service: General;  Laterality: N/A;    FAMILY HISTORY: Family History  Problem Relation Age of Onset   Diabetes Mellitus II Maternal Grandmother     SOCIAL HISTORY:  reports that she has never smoked. She has never used smokeless tobacco. She reports that she does not currently use alcohol after a past usage of about 203.0 standard drinks of alcohol per week. No history on file for drug use.  PERFORMANCE STATUS: The patient's performance status is 1 - Symptomatic but completely ambulatory  PHYSICAL EXAM: Most Recent Vital Signs: Blood pressure 115/85, pulse 93, temperature (!) 97.5 F (36.4 C), temperature source Oral, resp. rate 20, SpO2 100 %. BP 115/85 (BP Location: Left Arm, Patient Position: Sitting)   Pulse 93   Temp (!) 97.5 F (36.4 C) (Oral)   Resp 20   LMP  (LMP Unknown)  SpO2 100%   General Appearance:    Alert, cooperative, no distress, appears stated age  Head:    Normocephalic, without obvious abnormality, atraumatic  Eyes:    PERRL, conjunctiva/corneas clear, EOM's intact, fundi    benign, both eyes        Throat:   Lips, mucosa, and tongue normal; teeth and gums normal  Neck:   Supple, symmetrical, trachea midline, no adenopathy;    thyroid:  no enlargement/tenderness/nodules; no carotid   bruit or JVD  Back:     Symmetric, no curvature, ROM normal, no CVA tenderness  Lungs:     Clear to auscultation bilaterally, respirations unlabored  Chest Wall:    No tenderness or deformity   Heart:    Regular rate and rhythm, S1 and S2 normal, no murmur, rub   or gallop     Abdomen:     Soft, non-tender, bowel sounds active all four quadrants,    no masses, no organomegaly        Extremities:   Extremities normal, atraumatic, no cyanosis or edema  Pulses:   2+ and symmetric all extremities  Skin:   Skin color, texture, turgor normal, no rashes or lesions  Lymph nodes:   Cervical, supraclavicular, and axillary nodes normal  Neurologic:    CNII-XII intact, normal strength, sensation and reflexes    throughout    LABORATORY DATA:  Results for orders placed or performed in visit on 06/14/22 (from the past 48 hour(s))  CBC with Differential (Cancer Center Only)     Status: Abnormal   Collection Time: 06/14/22 10:40 AM  Result Value Ref Range   WBC Count 6.0 4.0 - 10.5 K/uL   RBC 4.20 3.87 - 5.11 MIL/uL   Hemoglobin 11.1 (L) 12.0 - 15.0 g/dL   HCT 73.5 (L) 32.9 - 92.4 %   MCV 79.8 (L) 80.0 - 100.0 fL   MCH 26.4 26.0 - 34.0 pg   MCHC 33.1 30.0 - 36.0 g/dL   RDW 26.8 (H) 34.1 - 96.2 %   Platelet Count 244 150 - 400 K/uL   nRBC 0.0 0.0 - 0.2 %   Neutrophils Relative % 52 %   Neutro Abs 3.1 1.7 - 7.7 K/uL   Lymphocytes Relative 39 %   Lymphs Abs 2.3 0.7 - 4.0 K/uL   Monocytes Relative 7 %   Monocytes Absolute 0.4 0.1 - 1.0 K/uL   Eosinophils Relative 1 %   Eosinophils Absolute 0.1 0.0 - 0.5 K/uL   Basophils Relative 1 %   Basophils Absolute 0.0 0.0 - 0.1 K/uL   Immature Granulocytes 0 %   Abs Immature Granulocytes 0.01 0.00 - 0.07 K/uL    Comment: Performed at [facility] at [facility], [address] [place], [place] [address]  CMP (Cancer Center only)     Status: Abnormal   Collection Time: 06/14/22 10:40 AM  Result Value Ref Range   Sodium 142 135 - 145 mmol/L   Potassium 3.1 (L) 3.5 - 5.1 mmol/L   Chloride 102 98 - 111 mmol/L   CO2 30 22 - 32 mmol/L   Glucose, Bld 111 (H) 70 - 99 mg/dL    Comment: Glucose reference range applies only to samples taken after fasting for at least 8 hours.   BUN 8 6 - 20 mg/dL   Creatinine 8.92 1.19 - 1.00 mg/dL   Calcium 41.7 8.9 - 40.8 mg/dL   Total Protein 7.5 6.5 - 8.1 g/dL  Albumin 3.8 3.5 - 5.0 g/dL   AST 13 (L) 15 - 41 U/L   ALT 10 0 - 44 U/L   Alkaline Phosphatase 104 38 - 126 U/L   Total Bilirubin 0.9 0.3 - 1.2 mg/dL   GFR, Estimated >42 >59 mL/min    Comment: (NOTE) Calculated using the CKD-EPI Creatinine Equation (2021)    Anion  gap 10 5 - 15    Comment: Performed at [facility] at [facility], [address] [place], [place] [address]  Lactate dehydrogenase (LDH)     Status: Abnormal   Collection Time: 06/14/22 10:40 AM  Result Value Ref Range   LDH 213 (H) 98 - 192 U/L    Comment: Performed at [facility] at [facility], [address], [place], [place] [address]  D-dimer, quantitative     Status: Abnormal   Collection Time: 06/14/22 10:40 AM  Result Value Ref Range   D-Dimer, Quant 1.42 (H) 0.00 - 0.50 ug/mL-FEU    Comment: (NOTE) At the manufacturer cut-off value of 0.5 g/mL FEU, this assay has a negative predictive value of 95-100%.This assay is intended for use in conjunction with a clinical pretest probability (PTP) assessment model to exclude pulmonary embolism (PE) and deep venous thrombosis (DVT) in outpatients suspected of PE or DVT. Results should be correlated with clinical presentation. Performed at [facility], [address] [place], [place] [address]       RADIOGRAPHY: No results found.     PATHOLOGY: None  ASSESSMENT/PLAN: Ms. Delira is a 51 yo [place] American female who had a stroke in April with history of TIA and HTN.  With one positive lupus anticoagulant and elevated anticardiolipin IgM antibody at 26.  Hyper coag pane is pending.  At this time she is only taking Plavix, no aspirin (allergy).  We will follow-up once results are available and see if she needs long term treatment with an anticoagulant.   All questions were answered. The patient/daughter know to call the clinic with any problems, questions or concerns. We can certainly see the patient much sooner if necessary.  The patient was discussed with Dr. Myna Hidalgo and he is in agreement with the aforementioned.   Eileen Stanford, NP

## 2022-06-16 LAB — CARDIOLIPIN ANTIBODIES, IGG, IGM, IGA
Anticardiolipin IgA: <9 APL U/mL (ref 0–11)
Anticardiolipin IgG: 12 GPL U/mL (ref 0–14)
Anticardiolipin IgM: <9 MPL U/mL (ref 0–12)

## 2022-06-17 LAB — PROTEIN S ACTIVITY: Protein S Activity: 80 % (ref 63–140)

## 2022-06-17 LAB — LUPUS ANTICOAGULANT PANEL
DRVVT: 44.8 s (ref 0.0–47.0)
PTT Lupus Anticoagulant: 41.4 s (ref 0.0–43.5)

## 2022-06-17 LAB — PROTEIN S, TOTAL: Protein S Ag, Total: 124 % (ref 60–150)

## 2022-06-17 LAB — PROTEIN C ACTIVITY: Protein C Activity: 118 % (ref 73–180)

## 2022-06-18 LAB — PROTEIN C, TOTAL: Protein C, Total: 92 % (ref 60–150)

## 2022-06-19 LAB — BETA-2-GLYCOPROTEIN I ABS, IGG/M/A
Beta-2 Glyco I IgG: <9 GPI IgG units (ref 0–20)
Beta-2-Glycoprotein I IgA: <9 GPI IgA units (ref 0–25)
Beta-2-Glycoprotein I IgM: <9 GPI IgM units (ref 0–32)

## 2022-06-19 LAB — HOMOCYSTEINE: Homocysteine: 11.1 umol/L (ref 0.0–14.5)

## 2022-06-20 ENCOUNTER — Inpatient Hospital Stay: Payer: No Typology Code available for payment source | Admitting: Neurology

## 2022-06-20 LAB — PROTHROMBIN GENE MUTATION

## 2022-06-24 ENCOUNTER — Telehealth: Payer: Self-pay | Admitting: Family Medicine

## 2022-06-24 ENCOUNTER — Encounter: Payer: Self-pay | Admitting: Family Medicine

## 2022-06-24 ENCOUNTER — Ambulatory Visit: Payer: No Typology Code available for payment source | Attending: Family Medicine | Admitting: Family Medicine

## 2022-06-24 DIAGNOSIS — A419 Sepsis, unspecified organism: Secondary | ICD-10-CM | POA: Diagnosis not present

## 2022-06-24 DIAGNOSIS — Z1159 Encounter for screening for other viral diseases: Secondary | ICD-10-CM

## 2022-06-24 DIAGNOSIS — I69359 Hemiplegia and hemiparesis following cerebral infarction affecting unspecified side: Secondary | ICD-10-CM

## 2022-06-24 DIAGNOSIS — I10 Essential (primary) hypertension: Secondary | ICD-10-CM | POA: Diagnosis not present

## 2022-06-24 DIAGNOSIS — E876 Hypokalemia: Secondary | ICD-10-CM | POA: Diagnosis not present

## 2022-06-24 DIAGNOSIS — D6861 Antiphospholipid syndrome: Secondary | ICD-10-CM

## 2022-06-24 DIAGNOSIS — Z978 Presence of other specified devices: Secondary | ICD-10-CM

## 2022-06-24 MED ORDER — PROPRANOLOL HCL 10 MG PO TABS: 10.0000 mg | ORAL_TABLET | Freq: Three times a day (TID) | ORAL | 1 refills | Status: DC

## 2022-06-24 MED ORDER — CLOPIDOGREL BISULFATE 75 MG PO TABS: 75.0000 mg | ORAL_TABLET | Freq: Every day | ORAL | 1 refills | Status: DC

## 2022-06-24 NOTE — Progress Notes (Signed)
Virtual Visit via Video Note  I connected with Cindy Brooks, on 06/24/2022 at 2:32 PM by video enabled telemedicine device and verified that I am speaking with the correct person using two identifiers.   Consent: I discussed the limitations, risks, security and privacy concerns of performing an evaluation and management service by telemedicine and the availability of in person appointments. I also discussed with the patient that there may be a patient responsible charge related to this service. The patient expressed understanding and agreed to proceed.   Location of Patient: Home  Location of Provider: Clinic   Persons participating in Telemedicine visit: [name] Daughter - [name] Dr. Alvis Lemmings     History of Present Illness: Cindy Brooks is a 51 y.o. 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 Hospitalized 01/2022 - 03/2022 for bilateral thalamic, midbrain, bilateral cerebral infarctions and required PEG tube placement, tracheostomy during hospitalization.  Hypercoagulable labs were also positive for antiphospholipid syndrome She was hospitalized 04/19/2022 through 04/21/2022 at [facility] for sepsis secondary to UTI.  She was treated with IV antibiotics and Foley catheter changed. Hospitalized for sepsis in 05/2022.   Visit changed to virtual due to daughter having COVID-19 infection.  Interval history: History is obtained from daughter.  Norvasc and hydralazine discontinued and propranolol dose decreased due to soft blood pressure during hospitalization. BP is 132 - 136 systolic at home.  She remains on Plavix for secondary prevention.  She is yet to see neurology since her stroke.  It appears referral was placed.  She had a visit with oncology 1 week ago to evaluate for anticoagulation in the setting of positive antiphospholipid syndrome.  Notes reviewed and once lab  results are obtained decision will be made regarding anticoagulation.  Her Home Health Nurse changed her Foley last Friday and daughter denies febrile or urinary symptoms. Denies additional concerns today. Past Medical History:  Diagnosis Date   Hypertension    Hypertensive urgency 01/20/2022   Seizure (HCC) 01/28/2022   Stroke (HCC)    TIA (transient ischemic attack) 2019   Urinary tract infection 01/28/2022   Allergies  Allergen Reactions   Aspirin Hives and Rash   Codeine Hives and Rash   Penicillins Anaphylaxis, Swelling and Other (See Comments)   Strawberry Extract Anaphylaxis and Hives    Current Outpatient Medications on File Prior to Visit  Medication Sig Dispense Refill   acetaminophen (TYLENOL) 325 MG tablet Take 2 tablets (650 mg total) by mouth every 4 (four) hours as needed. (Patient taking differently: Take 650 mg by mouth every 4 (four) hours as needed for mild pain.) 100 tablet 2   amantadine (SYMMETREL) 100 MG capsule Take 1 capsule (100 mg total) by mouth 2 (two) times daily. 60 capsule 1   atorvastatin (LIPITOR) 80 MG tablet Take 1 tablet (80 mg total) by mouth daily. 30 tablet 3   chlorhexidine (PERIDEX) 0.12 % solution Use as directed 15 mLs in the mouth or throat 2 (two) times daily. 120 mL 2   clopidogrel (PLAVIX) 75 MG tablet Take 1 tablet (75 mg total) by mouth daily. 30 tablet 1   DULoxetine (CYMBALTA) 60 MG capsule Take 1 capsule (60 mg total) by mouth daily. 30 capsule 3   Ensure Plus (ENSURE PLUS) LIQD Take 237 mLs by mouth 3 (three) times daily between meals.     folic acid (FOLVITE) 1 MG tablet Take 1 tablet (1 mg  total) by mouth daily. 30 tablet 1   gabapentin (NEURONTIN) 100 MG capsule Take 1 capsule (100 mg total) by mouth 2 (two) times daily. 60 capsule 3   ipratropium-albuterol (DUONEB) 0.5-2.5 (3) MG/3ML SOLN Take 3 mLs by nebulization every 6 (six) hours as needed. (Patient not taking: Reported on 06/14/2022) 360 mL 1   Multiple Vitamin  (MULTIVITAMIN WITH MINERALS) TABS tablet Take 1 tablet by mouth daily.     Nystatin (GERHARDT'S BUTT CREAM) CREA Apply 1 application. topically daily as needed for irritation.     nystatin (MYCOSTATIN) 100000 UNIT/ML suspension Take 5 mLs (500,000 Units total) by mouth 4 (four) times daily. 60 mL 2   polyethylene glycol powder (MIRALAX) 17 GM/SCOOP powder Take 17 g by mouth 2 (two) times daily as needed for moderate constipation. (Patient not taking: Reported on 06/14/2022) 255 g 0   propranolol (INDERAL) 20 MG tablet Take 0.5 tablets (10 mg total) by mouth 3 (three) times daily. 90 tablet 0   senna-docusate (SENOKOT-S) 8.6-50 MG tablet Take 1 tablet by mouth 2 (two) times daily between meals as needed for mild constipation. 180 tablet 0   thiamine 100 MG tablet Take 1 tablet (100 mg total) by mouth daily. 30 tablet 1   No current facility-administered medications on file prior to visit.    ROS: See HPI  Observations/Objective: Awake, not alert, not oriented -at baseline Patient is able to utter incomprehensible sounds Lying down comfortably in bed     Latest Ref Rng & Units 06/14/2022   10:40 AM 05/28/2022   12:08 PM 05/27/2022    8:24 AM  CMP  Glucose 70 - 99 mg/dL 983  382  505   BUN 6 - 20 mg/dL 8  10  20    Creatinine 0.44 - 1.00 mg/dL  3.97  6.73   Sodium 135 - 145 mmol/L 142  143  150   Potassium 3.5 - 5.1 mmol/L 3.1  4.3  4.0   Chloride 98 - 111 mmol/L 102  118  125   CO2 22 - 32 mmol/L 30  19  19    Calcium 8.9 - 10.3 mg/dL 4.19  8.5  8.6   Total Protein 6.5 - 8.1 g/dL 7.5   5.4   Total Bilirubin 0.3 - 1.2 mg/dL 0.9   0.5   Alkaline Phos 38 - 126 U/L 104   69   AST 15 - 41 U/L 13   22   ALT 0 - 44 U/L 10   15     Lipid Panel     Component Value Date/Time   CHOL 225 (H) 01/22/2022 0315   TRIG 181 (H) 01/22/2022 0315   HDL 57 01/22/2022 0315   CHOLHDL 3.9 01/22/2022 0315   VLDL 36 01/22/2022 0315   LDLCALC 132 (H) 01/22/2022 0315    Lab Results  Component Value  Date   HGBA1C 5.7 (H) 01/22/2022     Assessment and Plan: 1. Primary hypertension Controlled Counseled on blood pressure goal of less than 130/80, low-sodium, DASH diet, medication compliance, 150 minutes of moderate intensity exercise per week. Discussed medication compliance, adverse effects. - propranolol (INDERAL) 10 MG tablet; Take 1 tablet (10 mg total) by mouth 3 (three) times daily.  Dispense: 270 tablet; Refill: 1  2. Sepsis, due to unspecified organism, unspecified whether acute organ dysfunction present [facility]) Resolved Foley catheter was changed 1 week ago Advised daughter to schedule appointment with urology to ensure catheter is being changed regularly as this will  prevent subsequent UTIs  3. Hypokalemia Potassium at discharge was 3.1 - Potassium; Future  4. Antiphospholipid antibody with hypercoagulable state (HCC) Recently seen by hematology and repeat hypercoagulable panel sent off Decision for anticoagulation will be made once lab resulted  5. 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) Continue with high intensity statin, risk factor modification - clopidogrel (PLAVIX) 75 MG tablet; Take 1 tablet (75 mg total) by mouth daily.  Dispense: 90 tablet; Refill: 1 - Ambulatory referral to Neurology - LP+Non-HDL Cholesterol; Future  6. Foley catheter in place Catheter needs to be changed in 3 weeks She has to reschedule urology appointment due to daughter having COVID infection  7. Screening for viral disease - HCV Ab w Reflex to Quant PCR; Future   Follow Up Instructions: Return in about 3 months (around 09/23/2022) for Chronic medical conditions.    I discussed the assessment and treatment plan with the patient. The patient was provided an opportunity to ask questions and all were answered. The patient agreed with the plan and demonstrated an understanding of the instructions.   The patient was advised to call back or seek an in-person evaluation if the symptoms worsen  or if the condition fails to improve as anticipated.     I provided 20 minutes total of Telehealth time during this encounter including median intraservice time, reviewing previous notes, investigations, ordering medications, medical decision making, coordinating care and patient verbalized understanding at the end of the visit.     Hoy Register, MD, FAAFP. [facility] [place], [place] [phone]   06/24/2022, 2:32 PM

## 2022-06-24 NOTE — Telephone Encounter (Signed)
FYI

## 2022-06-24 NOTE — Telephone Encounter (Signed)
Noted.  Patient's daughter provided a controlled BP reading during today's virtual visit 1 hour ago.

## 2022-06-24 NOTE — Telephone Encounter (Signed)
Caller making provider aware pts RHR was 105 And BP was 160/118 at todays physical therapy visit  Caller states pt is currently taking her morning meds

## 2022-06-26 LAB — FACTOR 5 LEIDEN

## 2022-06-27 ENCOUNTER — Telehealth: Payer: Self-pay | Admitting: Family Medicine

## 2022-06-27 NOTE — Telephone Encounter (Signed)
Home Health nurse called reporting that the bed order request has been sent twice. First was 06/13/2022, the second was 06/24/2022. She is going to send another fax request for a bed and a wheelchair, please advise.    Clydie Braun, with [facility]  Best contact: [phone]

## 2022-06-27 NOTE — Telephone Encounter (Signed)
Fax has been received and will be faxed once PCP sign's off. 

## 2022-06-28 ENCOUNTER — Telehealth: Payer: Self-pay

## 2022-06-28 NOTE — Telephone Encounter (Signed)
-----   Message from Erenest Blank, NP sent at 06/28/2022  9:25 AM EDT ----- Hyper coag panel was negative!!! No underlying clotting issue found. No need to follow-up with our office needed at this time. Thank you!  Sarah   ----- Message ----- From: Leory Plowman, Lab In [place] Sent: 06/14/2022  11:03 AM EDT To: Erenest Blank, NP

## 2022-06-28 NOTE — Telephone Encounter (Signed)
Advised via MyChart.

## 2022-07-02 ENCOUNTER — Ambulatory Visit: Payer: No Typology Code available for payment source | Admitting: Family Medicine

## 2022-07-05 ENCOUNTER — Ambulatory Visit: Payer: Medicaid Other | Attending: Family Medicine

## 2022-07-05 DIAGNOSIS — E876 Hypokalemia: Secondary | ICD-10-CM

## 2022-07-05 DIAGNOSIS — I69359 Hemiplegia and hemiparesis following cerebral infarction affecting unspecified side: Secondary | ICD-10-CM

## 2022-07-05 DIAGNOSIS — Z1159 Encounter for screening for other viral diseases: Secondary | ICD-10-CM

## 2022-07-06 LAB — LP+NON-HDL CHOLESTEROL
Cholesterol, Total: 200 mg/dL — ABNORMAL HIGH (ref 100–199)
HDL: 35 mg/dL — ABNORMAL LOW (ref >39)
LDL Chol Calc (NIH): 131 mg/dL — ABNORMAL HIGH (ref 0–99)
Total Non-HDL-Chol (LDL+VLDL): 165 mg/dL — ABNORMAL HIGH (ref 0–129)
Triglycerides: 187 mg/dL — ABNORMAL HIGH (ref 0–149)
VLDL Cholesterol Cal: 34 mg/dL (ref 5–40)

## 2022-07-06 LAB — HCV AB W REFLEX TO QUANT PCR: HCV Ab: NONREACTIVE

## 2022-07-06 LAB — POTASSIUM: Potassium: 4.2 mmol/L (ref 3.5–5.2)

## 2022-07-06 LAB — HCV INTERPRETATION

## 2022-07-29 ENCOUNTER — Telehealth: Payer: No Typology Code available for payment source | Admitting: Emergency Medicine

## 2022-07-29 ENCOUNTER — Ambulatory Visit: Payer: Self-pay | Admitting: *Deleted

## 2022-07-29 DIAGNOSIS — T83511A Infection and inflammatory reaction due to indwelling urethral catheter, initial encounter: Secondary | ICD-10-CM

## 2022-07-29 MED ORDER — MISC. DEVICES MISC: 0 refills | Status: DC

## 2022-07-29 NOTE — Telephone Encounter (Signed)
Noted pt has virtual appointment.

## 2022-07-29 NOTE — Telephone Encounter (Signed)
Faxed to [place]. Fax confirmation received.

## 2022-07-29 NOTE — Telephone Encounter (Signed)
Order is ready

## 2022-07-29 NOTE — Telephone Encounter (Signed)
Message received from [place] advise.   Hi Cindy Brooks, [place], 14-Dec-1970 MRN:  [id number] hs UTI symptoms, first triager scheduled her [facility] virtual visit due to no openings in office. They can not do it because she has a foley, she is immobile. She has [place] Therapist, sports. If they [facility]) get an order faxed to [phone] for urine specimen, UA, C&S or whatever doctor wants this morning they will send nurse out to get it today. Is that possible? The daughter is trying to catch it early before she gets really sick.

## 2022-07-29 NOTE — Telephone Encounter (Signed)
[facility] Virtual visit canceled due to pt has foley and that disqualifies her for a virtual visit per her daughter. Pt is immobile unable to go to visit, [facility] RN will come out and obtain urine sample today if they get order early enough.

## 2022-07-29 NOTE — Telephone Encounter (Signed)
Daughter called.   [facility] said they did did not received an order for the urine sample for a nurse to go out and pick it up.    Reason for Disposition  [1] Follow-up call to recent contact AND [2] information only call, no triage required    Order for urine sample for this pt needs to be faxed to [place] ASAP.   Nurse needs it to get a sample from a Foley catheter.  Answer Assessment - Initial Assessment Questions 1. REASON FOR CALL or QUESTION: "What is your reason for calling today?" or "How can I best help you?" or "What question do you have that I can help answer?"     Daughter is calling in wanting to know why [place] [facility] has not sent the order over to [facility] so a nurse can go to the house and get a urine sample from her mother who is home bound?   I saw the message where Dr. Margarita Rana said the order was ready.   That's the only thing I see.   Daughter very upset that [facility] still does not have this order.   She called at 9:00 this morning regarding this.  I have sent a message to the nurse Carilyn Goodpasture to follow up on this order being sent to [place].  Protocols used: Information Only Call - No Triage-A-AH

## 2022-07-29 NOTE — Progress Notes (Signed)
Virtual Visit Consent   Cindy Brooks, you are scheduled for a virtual visit with a [facility] provider today. Just as with appointments in the office, your consent must be obtained to participate. Your consent will be active for this visit and any virtual visit you may have with one of our providers in the next 365 days. If you have a MyChart account, a copy of this consent can be sent to you electronically.  As this is a virtual visit, video technology does not allow for your provider to perform a traditional examination. This may limit your provider's ability to fully assess your condition. If your provider identifies any concerns that need to be evaluated in person or the need to arrange testing (such as labs, EKG, etc.), we will make arrangements to do so. Although advances in technology are sophisticated, we cannot ensure that it will always work on either your end or our end. If the connection with a video visit is poor, the visit may have to be switched to a telephone visit. With either a video or telephone visit, we are not always able to ensure that we have a secure connection.  By engaging in this virtual visit, you consent to the provision of healthcare and authorize for your insurance to be billed (if applicable) for the services provided during this visit. Depending on your insurance coverage, you may receive a charge related to this service.  I need to obtain your verbal consent now. Are you willing to proceed with your visit today? Cory Rama has provided verbal consent on 07/29/2022 for a virtual visit (video or telephone). Roxy Horseman, PA-C  Date: 07/29/2022 9:14 AM  Virtual Visit via Video Note   I, Roxy Horseman, connected with  Cindy Brooks  ([id number], 1971-09-29) on 07/29/22 at  9:15 AM EDT by a video-enabled telemedicine application and verified that I am speaking with the correct person using two identifiers.  Location: Patient: Virtual Visit Location Patient:  Home Provider: Virtual Visit Location Provider: Home   I discussed the limitations of evaluation and management by telemedicine and the availability of in person appointments. The patient expressed understanding and agreed to proceed.    Elsie Lincoln,   History of Present Illness: Cindy Brooks is a 51 y.o. who identifies as a female who was assigned female at birth, and is being seen today for cloudy urine. Hx provided by daughter.  Patient has indwelling urinary catheter.  HPI: HPI  Problems:  Patient Active Problem List   Diagnosis Date Noted   Sepsis (HCC) 05/25/2022   ARF (acute renal failure) (HCC) 05/25/2022   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) 05/14/2022   Catheter-associated urinary tract infection (HCC) 04/20/2022   Sepsis secondary to UTI (HCC) 04/19/2022   Acute encephalopathy 03/24/2022   Tachycardia    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   Dysphagia as late effect of stroke 03/20/2022   Acute urinary retention 03/20/2022   Normocytic anemia 03/20/2022   CVA (cerebral vascular accident) (HCC) 03/20/2022   Hypernatremia 02/07/2022   Coma (HCC) 02/07/2022   Antiphospholipid antibody with hypercoagulable state (HCC) 02/04/2022   Pressure injury of skin 02/01/2022   PEG  status (HCC) 01/29/2022   UTI (urinary tract infection) 01/28/2022   HTN (hypertension)    AKI (acute kidney injury) (HCC)    Acute bilateral thalamic, midbrain and bilateral cerebral ischemic CVA 01/22/2022    Allergies:  Allergies  Allergen Reactions   Aspirin Hives and Rash   Codeine Hives and  Rash   Penicillins Anaphylaxis, Swelling and Other (See Comments)   Strawberry Extract Anaphylaxis and Hives   Medications:  Current Outpatient Medications:    acetaminophen (TYLENOL) 325 MG tablet, Take 2 tablets (650 mg total) by mouth every 4 (four) hours as needed. (Patient taking differently: Take 650 mg by mouth every 4 (four) hours as needed for mild pain.), Disp: 100 tablet, Rfl: 2    amantadine (SYMMETREL) 100 MG capsule, Take 1 capsule (100 mg total) by mouth 2 (two) times daily., Disp: 60 capsule, Rfl: 1   atorvastatin (LIPITOR) 80 MG tablet, Take 1 tablet (80 mg total) by mouth daily., Disp: 30 tablet, Rfl: 3   chlorhexidine (PERIDEX) 0.12 % solution, Use as directed 15 mLs in the mouth or throat 2 (two) times daily., Disp: 120 mL, Rfl: 2   clopidogrel (PLAVIX) 75 MG tablet, Take 1 tablet (75 mg total) by mouth daily., Disp: 90 tablet, Rfl: 1   DULoxetine (CYMBALTA) 60 MG capsule, Take 1 capsule (60 mg total) by mouth daily., Disp: 30 capsule, Rfl: 3   Ensure Plus (ENSURE PLUS) LIQD, Take 237 mLs by mouth 3 (three) times daily between meals., Disp: , Rfl:    folic acid (FOLVITE) 1 MG tablet, Take 1 tablet (1 mg total) by mouth daily., Disp: 30 tablet, Rfl: 1   gabapentin (NEURONTIN) 100 MG capsule, Take 1 capsule (100 mg total) by mouth 2 (two) times daily., Disp: 60 capsule, Rfl: 3   ipratropium-albuterol (DUONEB) 0.5-2.5 (3) MG/3ML SOLN, Take 3 mLs by nebulization every 6 (six) hours as needed. (Patient not taking: Reported on 06/14/2022), Disp: 360 mL, Rfl: 1   Multiple Vitamin (MULTIVITAMIN WITH MINERALS) TABS tablet, Take 1 tablet by mouth daily., Disp: , Rfl:    Nystatin (GERHARDT'S BUTT CREAM) CREA, Apply 1 application. topically daily as needed for irritation., Disp: , Rfl:    nystatin (MYCOSTATIN) 100000 UNIT/ML suspension, Take 5 mLs (500,000 Units total) by mouth 4 (four) times daily., Disp: 60 mL, Rfl: 2   polyethylene glycol powder (MIRALAX) 17 GM/SCOOP powder, Take 17 g by mouth 2 (two) times daily as needed for moderate constipation. (Patient not taking: Reported on 06/14/2022), Disp: 255 g, Rfl: 0   propranolol (INDERAL) 10 MG tablet, Take 1 tablet (10 mg total) by mouth 3 (three) times daily., Disp: 270 tablet, Rfl: 1   senna-docusate (SENOKOT-S) 8.6-50 MG tablet, Take 1 tablet by mouth 2 (two) times daily between meals as needed for mild constipation., Disp: 180  tablet, Rfl: 0   thiamine 100 MG tablet, Take 1 tablet (100 mg total) by mouth daily., Disp: 30 tablet, Rfl: 1  Observations/Objective: Patient is well-developed, well-nourished in no acute distress.  Resting comfortably  at home.  Head is normocephalic, atraumatic.  No labored breathing.  Speech is clear and coherent with logical content.  Patient is alert and oriented at baseline.    Assessment and Plan: 1. Urinary tract infection associated with indwelling urethral catheter, initial encounter [facility])   Concern for catheter associated UTI, which falls outside of the scope of practice for virtual urgent care.  Patient redirected to in-person visit at either PCP office or urgent care.  Follow Up Instructions: I discussed the assessment and treatment plan with the patient. The patient was provided an opportunity to ask questions and all were answered. The patient agreed with the plan and demonstrated an understanding of the instructions.  A copy of instructions were sent to the patient via MyChart unless otherwise noted below.  The patient was advised to call back or seek an in-person evaluation if the symptoms worsen or if the condition fails to improve as anticipated.  Time:  I spent 8 minutes with the patient via telehealth technology discussing the above problems/concerns.    Roxy Horseman, PA-C

## 2022-07-29 NOTE — Telephone Encounter (Signed)
  Chief Complaint: Cloudy Urine Symptoms: Pt has foley, daughter states urine cloudy, "Little more lucid" (? Confused) "Like she gets when she has a UTI." Frequency: Yesterday Pertinent Negatives: Patient denies Fever Disposition: [] ED /[] Urgent Care (no appt availability in office) / [] Appointment(In office/virtual)/ [x]  [facility]/ [] Home Care/ [] Refused Recommended Disposition /[] [place] Mobile Bus/ []  Follow-up with PCP Additional Notes: [facility] Virtual secured for pt, care advise provided. Pt's daughter verbalizes understanding. Reason for Disposition  Urinating more frequently than usual (i.e., frequency)  Answer Assessment - Initial Assessment Questions 1. SYMPTOM: "What's the main symptom you're concerned about?" (e.g., frequency, incontinence)     Cloudy urine 2. ONSET: "When did the start?"     Noted last night 3. PAIN: "Is there any pain?" If Yes, ask: "How bad is it?" (Scale: 1-10; mild, moderate, severe)     No 4. CAUSE: "What do you think is causing the symptoms?"     UTI 5. OTHER SYMPTOMS: "Do you have any other symptoms?" (e.g., blood in urine, fever, flank pain, pain with urination)     More "Lucid" like when she has UTI"  Protocols used: Urinary Symptoms-A-AH

## 2022-07-29 NOTE — Telephone Encounter (Signed)
  Chief Complaint: Order for urine sample from Dr. Margarita Rana needs to be faxed to [place] ASAP so the [facility] nurse can get a urine sample from Foley Catheter today.  Daughter called in very upset that this order has not been sent to [place].   Symptoms: N/A Frequency: NA Pertinent Negatives: Patient denies NA Disposition: [] ED /[] Urgent Care (no appt availability in office) / [] Appointment(In office/virtual)/ []  [facility]/ [] Home Care/ [] Refused Recommended Disposition /[] [place] Mobile [facility]/ [x]  Follow-up with PCP Additional Notes: I sent a high priority message to [facility] and a Teams message to Carilyn Goodpasture, Therapist, sports.

## 2022-08-04 ENCOUNTER — Emergency Department (HOSPITAL_COMMUNITY): Payer: No Typology Code available for payment source

## 2022-08-04 ENCOUNTER — Other Ambulatory Visit: Payer: Self-pay

## 2022-08-04 ENCOUNTER — Emergency Department (HOSPITAL_COMMUNITY)
Admission: EM | Admit: 2022-08-04 | Discharge: 2022-08-04 | Disposition: A | Discharge status: Home / Self Care | Payer: No Typology Code available for payment source | Attending: Emergency Medicine | Admitting: Emergency Medicine

## 2022-08-04 DIAGNOSIS — R4182 Altered mental status, unspecified: Secondary | ICD-10-CM | POA: Diagnosis present

## 2022-08-04 DIAGNOSIS — Z7901 Long term (current) use of anticoagulants: Secondary | ICD-10-CM | POA: Diagnosis not present

## 2022-08-04 DIAGNOSIS — N390 Urinary tract infection, site not specified: Secondary | ICD-10-CM | POA: Diagnosis not present

## 2022-08-04 DIAGNOSIS — Z79899 Other long term (current) drug therapy: Secondary | ICD-10-CM | POA: Insufficient documentation

## 2022-08-04 DIAGNOSIS — I10 Essential (primary) hypertension: Secondary | ICD-10-CM | POA: Diagnosis not present

## 2022-08-04 LAB — URINALYSIS, ROUTINE W REFLEX MICROSCOPIC
Bilirubin Urine: NEGATIVE
Glucose, UA: NEGATIVE mg/dL
Ketones, ur: 40 mg/dL — AB
Nitrite: POSITIVE — AB
Protein, ur: NEGATIVE mg/dL
Specific Gravity, Urine: 1.01 (ref 1.005–1.030)
pH: 5.5 (ref 5.0–8.0)

## 2022-08-04 LAB — CBC WITH DIFFERENTIAL/PLATELET
Abs Immature Granulocytes: 0.02 10*3/uL (ref 0.00–0.07)
Basophils Absolute: 0 10*3/uL (ref 0.0–0.1)
Basophils Relative: 1 %
Eosinophils Absolute: 0 10*3/uL (ref 0.0–0.5)
Eosinophils Relative: 1 %
HCT: 30.9 % — ABNORMAL LOW (ref 36.0–46.0)
Hemoglobin: 10.6 g/dL — ABNORMAL LOW (ref 12.0–15.0)
Immature Granulocytes: 0 %
Lymphocytes Relative: 36 %
Lymphs Abs: 2 10*3/uL (ref 0.7–4.0)
MCH: 28.2 pg (ref 26.0–34.0)
MCHC: 34.3 g/dL (ref 30.0–36.0)
MCV: 82.2 fL (ref 80.0–100.0)
Monocytes Absolute: 0.4 10*3/uL (ref 0.1–1.0)
Monocytes Relative: 7 %
Neutro Abs: 3.1 10*3/uL (ref 1.7–7.7)
Neutrophils Relative %: 55 %
Platelets: 390 10*3/uL (ref 150–400)
RBC: 3.76 MIL/uL — ABNORMAL LOW (ref 3.87–5.11)
RDW: 15.9 % — ABNORMAL HIGH (ref 11.5–15.5)
WBC: 5.6 10*3/uL (ref 4.0–10.5)
nRBC: 0 % (ref 0.0–0.2)

## 2022-08-04 LAB — I-STAT CHEM 8, ED
BUN: 5 mg/dL — ABNORMAL LOW (ref 6–20)
Calcium, Ion: 1.16 mmol/L (ref 1.15–1.40)
Chloride: 98 mmol/L (ref 98–111)
Creatinine, Ser: 1 mg/dL (ref 0.44–1.00)
Glucose, Bld: 76 mg/dL (ref 70–99)
HCT: 34 % — ABNORMAL LOW (ref 36.0–46.0)
Hemoglobin: 11.6 g/dL — ABNORMAL LOW (ref 12.0–15.0)
Potassium: 4.3 mmol/L (ref 3.5–5.1)
Sodium: 134 mmol/L — ABNORMAL LOW (ref 135–145)
TCO2: 25 mmol/L (ref 22–32)

## 2022-08-04 LAB — URINALYSIS, MICROSCOPIC (REFLEX): WBC, UA: >50 WBC/hpf (ref 0–5)

## 2022-08-04 LAB — COMPREHENSIVE METABOLIC PANEL
ALT: 12 U/L (ref 0–44)
AST: 16 U/L (ref 15–41)
Albumin: 3.2 g/dL — ABNORMAL LOW (ref 3.5–5.0)
Alkaline Phosphatase: 99 U/L (ref 38–126)
Anion gap: 12 (ref 5–15)
BUN: 6 mg/dL (ref 6–20)
CO2: 24 mmol/L (ref 22–32)
Calcium: 9.4 mg/dL (ref 8.9–10.3)
Chloride: 99 mmol/L (ref 98–111)
Creatinine, Ser: 1.2 mg/dL — ABNORMAL HIGH (ref 0.44–1.00)
GFR, Estimated: 55 mL/min — ABNORMAL LOW (ref >60)
Glucose, Bld: 79 mg/dL (ref 70–99)
Potassium: 4.3 mmol/L (ref 3.5–5.1)
Sodium: 135 mmol/L (ref 135–145)
Total Bilirubin: 0.7 mg/dL (ref 0.3–1.2)
Total Protein: 6.8 g/dL (ref 6.5–8.1)

## 2022-08-04 LAB — LACTIC ACID, PLASMA: Lactic Acid, Venous: 1.1 mmol/L (ref 0.5–1.9)

## 2022-08-04 LAB — MAGNESIUM: Magnesium: 1.8 mg/dL (ref 1.7–2.4)

## 2022-08-04 MED ORDER — PROPRANOLOL HCL 10 MG PO TABS: 10.0000 mg | ORAL_TABLET | Freq: Once | ORAL | Status: DC

## 2022-08-04 MED ORDER — SULFAMETHOXAZOLE-TRIMETHOPRIM 800-160 MG PO TABS
1.0000 | ORAL_TABLET | Freq: Once | ORAL | Status: AC
  Administered 2022-08-04: 1 via ORAL
  Filled 2022-08-04: qty 1

## 2022-08-04 MED ORDER — DULOXETINE HCL 30 MG PO CPEP: 60.0000 mg | ORAL_CAPSULE | Freq: Once | ORAL | Status: DC

## 2022-08-04 MED ORDER — ATORVASTATIN CALCIUM 40 MG PO TABS
80.0000 mg | ORAL_TABLET | Freq: Every day | ORAL | Status: DC
  Administered 2022-08-04: 80 mg via ORAL
  Filled 2022-08-04: qty 2

## 2022-08-04 MED ORDER — LACTATED RINGERS IV BOLUS
1000.0000 mL | Freq: Once | INTRAVENOUS | Status: AC
  Administered 2022-08-04: 1000 mL via INTRAVENOUS

## 2022-08-04 MED ORDER — SULFAMETHOXAZOLE-TRIMETHOPRIM 800-160 MG PO TABS: 1.0000 | ORAL_TABLET | Freq: Two times a day (BID) | ORAL | 0 refills | Status: AC

## 2022-08-04 NOTE — ED Notes (Signed)
[facility] arrived to transport to take pt home.

## 2022-08-04 NOTE — Discharge Instructions (Addendum)
Please return to the emergency department if you develop any new or worsening symptoms, persistent fevers, lethargic, nausea vomiting, fevers or any other concerning symptoms.

## 2022-08-04 NOTE — ED Provider Notes (Signed)
ATTENDING SUPERVISORY NOTE I have personally viewed the imaging studies performed. I have personally seen and examined the patient, and discussed the plan of care with the resident.  I have reviewed the documentation of the resident and agree.  No diagnosis found.  Ultrasound ED Peripheral IV (Provider)  Date/Time: 08/04/2022 4:57 PM  Performed by: Elnora Morrison, MD Authorized by: Elnora Morrison, MD   Procedure details:    Indications: multiple failed IV attempts     Skin Prep: chlorhexidine gluconate     Location:  Right AC   Angiocath:  20 G   Bedside Ultrasound Guided: Yes     Images: archived     Patient tolerated procedure without complications: Yes       Elnora Morrison, MD 08/06/22 [phone]

## 2022-08-04 NOTE — ED Triage Notes (Signed)
Patient from home BIB [facility] w/ change from baseline in mental status. Patient w/ hx of prior stroke and normally can communicate slightly at baseline with family however since Wednesday patient has not been responding to verbal cues and has been lethargic. Per EMS the home health aide came out Wednesday and took a culture off of her catheter but this has not resulted and concerned for a UTI.  VSS.

## 2022-08-04 NOTE — ED Notes (Addendum)
RN unable to obtain IV acccess/lab work. Attempted via ultrasound x1. Dr Reather Converse at bedside, [place] established

## 2022-08-04 NOTE — ED Provider Notes (Signed)
[facility] EMERGENCY DEPARTMENT Provider Note   CSN: [id number] Arrival date & time: 08/04/22  1508     History {Add pertinent medical, surgical, social history, OB history to HPI:1} No chief complaint on file.   Cindy Brooks is a 51 y.o. female.  HPI     Home Medications Prior to Admission medications   Medication Sig Start Date End Date Taking? Authorizing Provider  acetaminophen (TYLENOL) 325 MG tablet Take 2 tablets (650 mg total) by mouth every 4 (four) hours as needed. Patient taking differently: Take 650 mg by mouth every 4 (four) hours as needed for mild pain. 03/21/22 03/21/23  Mercy Riding, MD  amantadine (SYMMETREL) 100 MG capsule Take 1 capsule (100 mg total) by mouth 2 (two) times daily. 03/21/22 05/26/22  Mercy Riding, MD  atorvastatin (LIPITOR) 80 MG tablet Take 1 tablet (80 mg total) by mouth daily. 05/14/22   Charlott Rakes, MD  chlorhexidine (PERIDEX) 0.12 % solution Use as directed 15 mLs in the mouth or throat 2 (two) times daily. 05/28/22   Georgette Shell, MD  clopidogrel (PLAVIX) 75 MG tablet Take 1 tablet (75 mg total) by mouth daily. 06/24/22   Charlott Rakes, MD  DULoxetine (CYMBALTA) 60 MG capsule Take 1 capsule (60 mg total) by mouth daily. 05/14/22   Charlott Rakes, MD  Ensure Plus (ENSURE PLUS) LIQD Take 237 mLs by mouth 3 (three) times daily between meals.    [provider]  folic acid (FOLVITE) 1 MG tablet Take 1 tablet (1 mg total) by mouth daily. 03/21/22   Mercy Riding, MD  gabapentin (NEURONTIN) 100 MG capsule Take 1 capsule (100 mg total) by mouth 2 (two) times daily. 05/14/22   Charlott Rakes, MD  ipratropium-albuterol (DUONEB) 0.5-2.5 (3) MG/3ML SOLN Take 3 mLs by nebulization every 6 (six) hours as needed. Patient not taking: Reported on 06/14/2022 03/21/22   Mercy Riding, MD  Misc. Devices MISC Urinalysis, urine culture and sensitivity 07/29/22   Charlott Rakes, MD  Multiple Vitamin (MULTIVITAMIN WITH MINERALS) TABS  tablet Take 1 tablet by mouth daily. 03/21/22   Mercy Riding, MD  Nystatin (GERHARDT'S BUTT CREAM) CREA Apply 1 application. topically daily as needed for irritation. 03/21/22   Mercy Riding, MD  nystatin (MYCOSTATIN) 100000 UNIT/ML suspension Take 5 mLs (500,000 Units total) by mouth 4 (four) times daily. 05/28/22   Georgette Shell, MD  polyethylene glycol powder [facility]) 17 GM/SCOOP powder Take 17 g by mouth 2 (two) times daily as needed for moderate constipation. Patient not taking: Reported on 06/14/2022 03/21/22   Mercy Riding, MD  propranolol (INDERAL) 10 MG tablet Take 1 tablet (10 mg total) by mouth 3 (three) times daily. 06/24/22   Charlott Rakes, MD  senna-docusate (SENOKOT-S) 8.6-50 MG tablet Take 1 tablet by mouth 2 (two) times daily between meals as needed for mild constipation. 03/21/22   Mercy Riding, MD  thiamine 100 MG tablet Take 1 tablet (100 mg total) by mouth daily. 03/21/22   Mercy Riding, MD      Allergies    Aspirin, Codeine, Penicillins, and Strawberry extract    Review of Systems   Review of Systems  Physical Exam Updated Vital Signs LMP  (LMP Unknown)  Physical Exam  ED Results / Procedures / Treatments   Labs (all labs ordered are listed, but only abnormal results are displayed) Labs Reviewed - No data to display  EKG None  Radiology No results found.  Procedures Procedures  {Document cardiac monitor, telemetry assessment procedure when appropriate:1}  Medications Ordered in ED Medications - No data to display  ED Course/ Medical Decision Making/ A&P                           Medical Decision Making  ***  {Document critical care time when appropriate:1} {Document review of labs and clinical decision tools ie heart score, Chads2Vasc2 etc:1}  {Document your independent review of radiology images, and any outside records:1} {Document your discussion with family members, caretakers, and with consultants:1} {Document social determinants of health  affecting pt's care:1} {Document your decision making why or why not admission, treatments were needed:1} Final Clinical Impression(s) / ED Diagnoses Final diagnoses:  None    Rx / DC Orders ED Discharge Orders     None

## 2022-08-04 NOTE — ED Notes (Signed)
RN spoke w/ pt's daughter, updated on plan of care for discharge home. Pt's daughter aware pt will be returning home via [place]. Discharge instructions reviewed w/ daughter.

## 2022-08-06 LAB — URINE CULTURE: Culture: >=100000 — AB

## 2022-08-07 ENCOUNTER — Telehealth (HOSPITAL_BASED_OUTPATIENT_CLINIC_OR_DEPARTMENT_OTHER): Payer: Self-pay | Admitting: *Deleted

## 2022-08-07 NOTE — Telephone Encounter (Signed)
Post ED Visit - Positive Culture Follow-up  Culture report reviewed by antimicrobial stewardship pharmacist: [place] Team []  Elenor Quinones, Pharm.D. []  Heide Guile, Pharm.D., BCPS AQ-ID []  Parks Neptune, Pharm.D., BCPS []  Alycia Rossetti, Pharm.D., BCPS []  [place], Pharm.D., BCPS, AAHIVP []  Legrand Como, Pharm.D., BCPS, AAHIVP []  Salome Arnt, PharmD, BCPS []  Johnnette Gourd, PharmD, BCPS []  Hughes Better, PharmD, BCPS []  Leeroy Cha, PharmD []  Laqueta Linden, PharmD, BCPS []  Albertina Parr, PharmD  [place] Team []  Leodis Sias, PharmD []  Lindell Spar, PharmD []  Royetta Asal, PharmD []  Graylin Shiver, Rph []  Rema Fendt) Glennon Mac, PharmD []  Arlyn Dunning, PharmD []  Netta Cedars, PharmD []  Dia Sitter, PharmD []  Leone Haven, PharmD []  Gretta Arab, PharmD []  Theodis Shove, PharmD []  Peggyann Juba, PharmD []  Reuel Boom, PharmD   Positive urine culture Treated with Sulfamethoxazole-Trimethoprim , organism sensitive to the same and no further patient follow-up is required at this time.  Esmeralda Arthur, Pharm D  Harlon Flor Talley 08/07/2022, 12:26 PM

## 2022-08-14 ENCOUNTER — Other Ambulatory Visit: Payer: Self-pay

## 2022-08-14 ENCOUNTER — Emergency Department (HOSPITAL_COMMUNITY): Payer: No Typology Code available for payment source

## 2022-08-14 ENCOUNTER — Inpatient Hospital Stay (HOSPITAL_COMMUNITY)
Admission: EM | Admit: 2022-08-14 | Discharge: 2022-09-02 | DRG: 698 | Disposition: A | Discharge status: Home / Self Care | Payer: No Typology Code available for payment source | Attending: Internal Medicine | Admitting: Internal Medicine

## 2022-08-14 DIAGNOSIS — I639 Cerebral infarction, unspecified: Secondary | ICD-10-CM | POA: Diagnosis present

## 2022-08-14 DIAGNOSIS — Z880 Allergy status to penicillin: Secondary | ICD-10-CM

## 2022-08-14 DIAGNOSIS — Z87440 Personal history of urinary (tract) infections: Secondary | ICD-10-CM

## 2022-08-14 DIAGNOSIS — T83511A Infection and inflammatory reaction due to indwelling urethral catheter, initial encounter: Secondary | ICD-10-CM | POA: Diagnosis not present

## 2022-08-14 DIAGNOSIS — R1310 Dysphagia, unspecified: Secondary | ICD-10-CM

## 2022-08-14 DIAGNOSIS — F1011 Alcohol abuse, in remission: Secondary | ICD-10-CM | POA: Diagnosis present

## 2022-08-14 DIAGNOSIS — Z885 Allergy status to narcotic agent status: Secondary | ICD-10-CM

## 2022-08-14 DIAGNOSIS — E860 Dehydration: Secondary | ICD-10-CM | POA: Diagnosis present

## 2022-08-14 DIAGNOSIS — A419 Sepsis, unspecified organism: Secondary | ICD-10-CM | POA: Diagnosis present

## 2022-08-14 DIAGNOSIS — Y846 Urinary catheterization as the cause of abnormal reaction of the patient, or of later complication, without mention of misadventure at the time of the procedure: Secondary | ICD-10-CM | POA: Diagnosis present

## 2022-08-14 DIAGNOSIS — G931 Anoxic brain damage, not elsewhere classified: Secondary | ICD-10-CM | POA: Diagnosis not present

## 2022-08-14 DIAGNOSIS — E162 Hypoglycemia, unspecified: Secondary | ICD-10-CM | POA: Diagnosis not present

## 2022-08-14 DIAGNOSIS — J189 Pneumonia, unspecified organism: Secondary | ICD-10-CM

## 2022-08-14 DIAGNOSIS — Z1152 Encounter for screening for COVID-19: Secondary | ICD-10-CM

## 2022-08-14 DIAGNOSIS — R059 Cough, unspecified: Secondary | ICD-10-CM | POA: Diagnosis not present

## 2022-08-14 DIAGNOSIS — Z886 Allergy status to analgesic agent status: Secondary | ICD-10-CM

## 2022-08-14 DIAGNOSIS — L89312 Pressure ulcer of right buttock, stage 2: Secondary | ICD-10-CM | POA: Diagnosis present

## 2022-08-14 DIAGNOSIS — I69392 Facial weakness following cerebral infarction: Secondary | ICD-10-CM

## 2022-08-14 DIAGNOSIS — I119 Hypertensive heart disease without heart failure: Secondary | ICD-10-CM | POA: Diagnosis present

## 2022-08-14 DIAGNOSIS — J690 Pneumonitis due to inhalation of food and vomit: Secondary | ICD-10-CM | POA: Diagnosis not present

## 2022-08-14 DIAGNOSIS — G8929 Other chronic pain: Secondary | ICD-10-CM | POA: Diagnosis present

## 2022-08-14 DIAGNOSIS — Z79899 Other long term (current) drug therapy: Secondary | ICD-10-CM

## 2022-08-14 DIAGNOSIS — Z7902 Long term (current) use of antithrombotics/antiplatelets: Secondary | ICD-10-CM

## 2022-08-14 DIAGNOSIS — T17890A Other foreign object in other parts of respiratory tract causing asphyxiation, initial encounter: Secondary | ICD-10-CM | POA: Diagnosis not present

## 2022-08-14 DIAGNOSIS — Z7401 Bed confinement status: Secondary | ICD-10-CM

## 2022-08-14 DIAGNOSIS — D6862 Lupus anticoagulant syndrome: Secondary | ICD-10-CM | POA: Diagnosis present

## 2022-08-14 DIAGNOSIS — Z91018 Allergy to other foods: Secondary | ICD-10-CM

## 2022-08-14 DIAGNOSIS — D638 Anemia in other chronic diseases classified elsewhere: Secondary | ICD-10-CM | POA: Diagnosis present

## 2022-08-14 DIAGNOSIS — F32A Depression, unspecified: Secondary | ICD-10-CM | POA: Diagnosis present

## 2022-08-14 DIAGNOSIS — I69351 Hemiplegia and hemiparesis following cerebral infarction affecting right dominant side: Secondary | ICD-10-CM

## 2022-08-14 DIAGNOSIS — G8250 Quadriplegia, unspecified: Secondary | ICD-10-CM | POA: Diagnosis present

## 2022-08-14 DIAGNOSIS — Y731 Therapeutic (nonsurgical) and rehabilitative gastroenterology and urology devices associated with adverse incidents: Secondary | ICD-10-CM | POA: Diagnosis present

## 2022-08-14 DIAGNOSIS — J9601 Acute respiratory failure with hypoxia: Secondary | ICD-10-CM | POA: Diagnosis not present

## 2022-08-14 DIAGNOSIS — G9341 Metabolic encephalopathy: Secondary | ICD-10-CM | POA: Diagnosis present

## 2022-08-14 DIAGNOSIS — R6520 Severe sepsis without septic shock: Secondary | ICD-10-CM | POA: Diagnosis present

## 2022-08-14 DIAGNOSIS — Z833 Family history of diabetes mellitus: Secondary | ICD-10-CM

## 2022-08-14 DIAGNOSIS — R54 Age-related physical debility: Secondary | ICD-10-CM | POA: Diagnosis present

## 2022-08-14 DIAGNOSIS — I69320 Aphasia following cerebral infarction: Secondary | ICD-10-CM

## 2022-08-14 DIAGNOSIS — E871 Hypo-osmolality and hyponatremia: Secondary | ICD-10-CM | POA: Diagnosis present

## 2022-08-14 DIAGNOSIS — E870 Hyperosmolality and hypernatremia: Secondary | ICD-10-CM | POA: Diagnosis not present

## 2022-08-14 DIAGNOSIS — R4701 Aphasia: Secondary | ICD-10-CM | POA: Diagnosis present

## 2022-08-14 DIAGNOSIS — L8990 Pressure ulcer of unspecified site, unspecified stage: Secondary | ICD-10-CM | POA: Diagnosis present

## 2022-08-14 DIAGNOSIS — Z681 Body mass index (BMI) 19 or less, adult: Secondary | ICD-10-CM

## 2022-08-14 DIAGNOSIS — J159 Unspecified bacterial pneumonia: Secondary | ICD-10-CM | POA: Diagnosis present

## 2022-08-14 DIAGNOSIS — E8721 Acute metabolic acidosis: Secondary | ICD-10-CM | POA: Diagnosis not present

## 2022-08-14 DIAGNOSIS — R339 Retention of urine, unspecified: Secondary | ICD-10-CM | POA: Diagnosis present

## 2022-08-14 DIAGNOSIS — E440 Moderate protein-calorie malnutrition: Secondary | ICD-10-CM | POA: Insufficient documentation

## 2022-08-14 DIAGNOSIS — N390 Urinary tract infection, site not specified: Secondary | ICD-10-CM | POA: Diagnosis present

## 2022-08-14 DIAGNOSIS — E876 Hypokalemia: Secondary | ICD-10-CM | POA: Diagnosis not present

## 2022-08-14 DIAGNOSIS — T17998A Other foreign object in respiratory tract, part unspecified causing other injury, initial encounter: Secondary | ICD-10-CM

## 2022-08-14 MED ORDER — LACTATED RINGERS IV SOLN: INTRAVENOUS | Status: AC

## 2022-08-14 MED ORDER — SODIUM CHLORIDE 0.9 % IV SOLN: 2.0000 g | Freq: Once | INTRAVENOUS | Status: DC

## 2022-08-14 MED ORDER — METRONIDAZOLE 500 MG/100ML IV SOLN
500.0000 mg | Freq: Once | INTRAVENOUS | Status: AC
  Administered 2022-08-15: 500 mg via INTRAVENOUS
  Filled 2022-08-14: qty 100

## 2022-08-14 MED ORDER — VANCOMYCIN HCL IN DEXTROSE 1-5 GM/200ML-% IV SOLN: 1000.0000 mg | Freq: Once | INTRAVENOUS | Status: DC

## 2022-08-14 MED ORDER — SODIUM CHLORIDE 0.9 % IV SOLN
2.0000 g | Freq: Once | INTRAVENOUS | Status: AC
  Administered 2022-08-15: 2 g via INTRAVENOUS
  Filled 2022-08-14: qty 12.5

## 2022-08-14 MED ORDER — VANCOMYCIN HCL 1250 MG/250ML IV SOLN
1250.0000 mg | Freq: Once | INTRAVENOUS | Status: AC
  Administered 2022-08-15: 1250 mg via INTRAVENOUS
  Filled 2022-08-14: qty 250

## 2022-08-14 MED ORDER — LACTATED RINGERS IV BOLUS (SEPSIS)
1000.0000 mL | Freq: Once | INTRAVENOUS | Status: AC
  Administered 2022-08-15: 1000 mL via INTRAVENOUS

## 2022-08-14 NOTE — ED Triage Notes (Signed)
Patient BIB EMS for evaluation of cough and "refusing to take medications."  Patient is bedbound at baseline due to previous stroke. Daughter is main care taker and reports that she is at baseline mental status.  Recently treated for UTI.  Developed a "slight cough."  No reports of fevers

## 2022-08-15 ENCOUNTER — Inpatient Hospital Stay (HOSPITAL_COMMUNITY): Payer: No Typology Code available for payment source

## 2022-08-15 ENCOUNTER — Inpatient Hospital Stay: Payer: No Typology Code available for payment source | Admitting: Neurology

## 2022-08-15 ENCOUNTER — Other Ambulatory Visit: Payer: Self-pay

## 2022-08-15 DIAGNOSIS — D638 Anemia in other chronic diseases classified elsewhere: Secondary | ICD-10-CM | POA: Diagnosis present

## 2022-08-15 DIAGNOSIS — Z681 Body mass index (BMI) 19 or less, adult: Secondary | ICD-10-CM | POA: Diagnosis not present

## 2022-08-15 DIAGNOSIS — E870 Hyperosmolality and hypernatremia: Secondary | ICD-10-CM | POA: Diagnosis not present

## 2022-08-15 DIAGNOSIS — R059 Cough, unspecified: Secondary | ICD-10-CM | POA: Diagnosis present

## 2022-08-15 DIAGNOSIS — I119 Hypertensive heart disease without heart failure: Secondary | ICD-10-CM | POA: Diagnosis present

## 2022-08-15 DIAGNOSIS — I69351 Hemiplegia and hemiparesis following cerebral infarction affecting right dominant side: Secondary | ICD-10-CM | POA: Diagnosis not present

## 2022-08-15 DIAGNOSIS — J159 Unspecified bacterial pneumonia: Secondary | ICD-10-CM | POA: Diagnosis not present

## 2022-08-15 DIAGNOSIS — G9341 Metabolic encephalopathy: Secondary | ICD-10-CM | POA: Diagnosis not present

## 2022-08-15 DIAGNOSIS — G9340 Encephalopathy, unspecified: Secondary | ICD-10-CM | POA: Diagnosis not present

## 2022-08-15 DIAGNOSIS — R4701 Aphasia: Secondary | ICD-10-CM | POA: Diagnosis present

## 2022-08-15 DIAGNOSIS — T83511A Infection and inflammatory reaction due to indwelling urethral catheter, initial encounter: Secondary | ICD-10-CM | POA: Diagnosis present

## 2022-08-15 DIAGNOSIS — E871 Hypo-osmolality and hyponatremia: Secondary | ICD-10-CM | POA: Diagnosis present

## 2022-08-15 DIAGNOSIS — F32A Depression, unspecified: Secondary | ICD-10-CM | POA: Diagnosis present

## 2022-08-15 DIAGNOSIS — T17890A Other foreign object in other parts of respiratory tract causing asphyxiation, initial encounter: Secondary | ICD-10-CM | POA: Diagnosis not present

## 2022-08-15 DIAGNOSIS — G8250 Quadriplegia, unspecified: Secondary | ICD-10-CM | POA: Diagnosis present

## 2022-08-15 DIAGNOSIS — N3000 Acute cystitis without hematuria: Secondary | ICD-10-CM | POA: Diagnosis not present

## 2022-08-15 DIAGNOSIS — L89312 Pressure ulcer of right buttock, stage 2: Secondary | ICD-10-CM | POA: Diagnosis present

## 2022-08-15 DIAGNOSIS — A419 Sepsis, unspecified organism: Secondary | ICD-10-CM | POA: Diagnosis not present

## 2022-08-15 DIAGNOSIS — Y846 Urinary catheterization as the cause of abnormal reaction of the patient, or of later complication, without mention of misadventure at the time of the procedure: Secondary | ICD-10-CM | POA: Diagnosis present

## 2022-08-15 DIAGNOSIS — N390 Urinary tract infection, site not specified: Secondary | ICD-10-CM | POA: Diagnosis not present

## 2022-08-15 DIAGNOSIS — Y731 Therapeutic (nonsurgical) and rehabilitative gastroenterology and urology devices associated with adverse incidents: Secondary | ICD-10-CM | POA: Diagnosis present

## 2022-08-15 DIAGNOSIS — G931 Anoxic brain damage, not elsewhere classified: Secondary | ICD-10-CM | POA: Diagnosis not present

## 2022-08-15 DIAGNOSIS — E440 Moderate protein-calorie malnutrition: Secondary | ICD-10-CM | POA: Diagnosis present

## 2022-08-15 DIAGNOSIS — D6862 Lupus anticoagulant syndrome: Secondary | ICD-10-CM | POA: Diagnosis present

## 2022-08-15 DIAGNOSIS — F1011 Alcohol abuse, in remission: Secondary | ICD-10-CM | POA: Diagnosis present

## 2022-08-15 DIAGNOSIS — E8721 Acute metabolic acidosis: Secondary | ICD-10-CM | POA: Diagnosis not present

## 2022-08-15 DIAGNOSIS — J690 Pneumonitis due to inhalation of food and vomit: Secondary | ICD-10-CM | POA: Diagnosis not present

## 2022-08-15 DIAGNOSIS — J189 Pneumonia, unspecified organism: Secondary | ICD-10-CM | POA: Diagnosis not present

## 2022-08-15 DIAGNOSIS — R6520 Severe sepsis without septic shock: Secondary | ICD-10-CM | POA: Diagnosis present

## 2022-08-15 DIAGNOSIS — I63513 Cerebral infarction due to unspecified occlusion or stenosis of bilateral middle cerebral arteries: Secondary | ICD-10-CM | POA: Diagnosis not present

## 2022-08-15 DIAGNOSIS — Z7189 Other specified counseling: Secondary | ICD-10-CM

## 2022-08-15 DIAGNOSIS — R1310 Dysphagia, unspecified: Secondary | ICD-10-CM | POA: Diagnosis not present

## 2022-08-15 DIAGNOSIS — Z1152 Encounter for screening for COVID-19: Secondary | ICD-10-CM | POA: Diagnosis not present

## 2022-08-15 DIAGNOSIS — J9601 Acute respiratory failure with hypoxia: Secondary | ICD-10-CM | POA: Diagnosis not present

## 2022-08-15 LAB — CBC WITH DIFFERENTIAL/PLATELET
Abs Immature Granulocytes: 0.04 10*3/uL (ref 0.00–0.07)
Basophils Absolute: 0 10*3/uL (ref 0.0–0.1)
Basophils Relative: 1 %
Eosinophils Absolute: 0 10*3/uL (ref 0.0–0.5)
Eosinophils Relative: 0 %
HCT: 32.5 % — ABNORMAL LOW (ref 36.0–46.0)
Hemoglobin: 10.6 g/dL — ABNORMAL LOW (ref 12.0–15.0)
Immature Granulocytes: 1 %
Lymphocytes Relative: 27 %
Lymphs Abs: 1.9 10*3/uL (ref 0.7–4.0)
MCH: 28.2 pg (ref 26.0–34.0)
MCHC: 32.6 g/dL (ref 30.0–36.0)
MCV: 86.4 fL (ref 80.0–100.0)
Monocytes Absolute: 0.4 10*3/uL (ref 0.1–1.0)
Monocytes Relative: 6 %
Neutro Abs: 4.7 10*3/uL (ref 1.7–7.7)
Neutrophils Relative %: 65 %
Platelets: 304 10*3/uL (ref 150–400)
RBC: 3.76 MIL/uL — ABNORMAL LOW (ref 3.87–5.11)
RDW: 16.2 % — ABNORMAL HIGH (ref 11.5–15.5)
WBC: 7.1 10*3/uL (ref 4.0–10.5)
nRBC: 0 % (ref 0.0–0.2)

## 2022-08-15 LAB — URINALYSIS, ROUTINE W REFLEX MICROSCOPIC
Bilirubin Urine: NEGATIVE
Glucose, UA: NEGATIVE mg/dL
Ketones, ur: 20 mg/dL — AB
Nitrite: NEGATIVE
Protein, ur: 30 mg/dL — AB
Specific Gravity, Urine: 1.016 (ref 1.005–1.030)
pH: 5 (ref 5.0–8.0)

## 2022-08-15 LAB — RESP PANEL BY RT-PCR (FLU A&B, COVID) ARPGX2
Influenza A by PCR: NEGATIVE
Influenza B by PCR: NEGATIVE
SARS Coronavirus 2 by RT PCR: NEGATIVE

## 2022-08-15 LAB — MAGNESIUM: Magnesium: 1.6 mg/dL — ABNORMAL LOW (ref 1.7–2.4)

## 2022-08-15 LAB — COMPREHENSIVE METABOLIC PANEL
ALT: 9 U/L (ref 0–44)
AST: 16 U/L (ref 15–41)
Albumin: 2.9 g/dL — ABNORMAL LOW (ref 3.5–5.0)
Alkaline Phosphatase: 60 U/L (ref 38–126)
Anion gap: 15 (ref 5–15)
BUN: 12 mg/dL (ref 6–20)
CO2: 20 mmol/L — ABNORMAL LOW (ref 22–32)
Calcium: 9.5 mg/dL (ref 8.9–10.3)
Chloride: 114 mmol/L — ABNORMAL HIGH (ref 98–111)
Creatinine, Ser: 1.13 mg/dL — ABNORMAL HIGH (ref 0.44–1.00)
GFR, Estimated: 59 mL/min — ABNORMAL LOW (ref >60)
Glucose, Bld: 81 mg/dL (ref 70–99)
Potassium: 3.5 mmol/L (ref 3.5–5.1)
Sodium: 149 mmol/L — ABNORMAL HIGH (ref 135–145)
Total Bilirubin: 1 mg/dL (ref 0.3–1.2)
Total Protein: 5.6 g/dL — ABNORMAL LOW (ref 6.5–8.1)

## 2022-08-15 LAB — APTT: aPTT: 28 seconds (ref 24–36)

## 2022-08-15 LAB — CREATININE, SERUM
Creatinine, Ser: 1.07 mg/dL — ABNORMAL HIGH (ref 0.44–1.00)
GFR, Estimated: >60 mL/min (ref >60)

## 2022-08-15 LAB — CBC
HCT: 25.5 % — ABNORMAL LOW (ref 36.0–46.0)
Hemoglobin: 8.4 g/dL — ABNORMAL LOW (ref 12.0–15.0)
MCH: 28.2 pg (ref 26.0–34.0)
MCHC: 32.9 g/dL (ref 30.0–36.0)
MCV: 85.6 fL (ref 80.0–100.0)
Platelets: 217 10*3/uL (ref 150–400)
RBC: 2.98 MIL/uL — ABNORMAL LOW (ref 3.87–5.11)
RDW: 15.9 % — ABNORMAL HIGH (ref 11.5–15.5)
WBC: 8.3 10*3/uL (ref 4.0–10.5)
nRBC: 0 % (ref 0.0–0.2)

## 2022-08-15 LAB — LACTIC ACID, PLASMA
Lactic Acid, Venous: 2.3 mmol/L (ref 0.5–1.9)
Lactic Acid, Venous: 1.4 mmol/L (ref 0.5–1.9)

## 2022-08-15 LAB — PROTIME-INR
INR: 1.1 (ref 0.8–1.2)
Prothrombin Time: 14.2 seconds (ref 11.4–15.2)

## 2022-08-15 LAB — CBG MONITORING, ED: Glucose-Capillary: 80 mg/dL (ref 70–99)

## 2022-08-15 MED ORDER — MAGNESIUM SULFATE 2 GM/50ML IV SOLN
2.0000 g | Freq: Once | INTRAVENOUS | Status: AC
  Administered 2022-08-15: 2 g via INTRAVENOUS
  Filled 2022-08-15: qty 50

## 2022-08-15 MED ORDER — CLOPIDOGREL BISULFATE 75 MG PO TABS
75.0000 mg | ORAL_TABLET | Freq: Every day | ORAL | Status: DC
  Administered 2022-08-18: 75 mg via ORAL
  Filled 2022-08-15: qty 1

## 2022-08-15 MED ORDER — DULOXETINE HCL 60 MG PO CPEP: 60.0000 mg | ORAL_CAPSULE | Freq: Every day | ORAL | Status: DC

## 2022-08-15 MED ORDER — ACETAMINOPHEN 650 MG RE SUPP: 650.0000 mg | Freq: Four times a day (QID) | RECTAL | Status: DC | PRN

## 2022-08-15 MED ORDER — ONDANSETRON HCL 4 MG PO TABS: 4.0000 mg | ORAL_TABLET | Freq: Four times a day (QID) | ORAL | Status: DC | PRN

## 2022-08-15 MED ORDER — SODIUM CHLORIDE 0.9 % IV SOLN
500.0000 mg | INTRAVENOUS | Status: AC
  Administered 2022-08-15 – 2022-08-20 (×6): 500 mg via INTRAVENOUS
  Filled 2022-08-15 (×8): qty 5

## 2022-08-15 MED ORDER — ENOXAPARIN SODIUM 40 MG/0.4ML IJ SOSY
40.0000 mg | PREFILLED_SYRINGE | Freq: Every day | INTRAMUSCULAR | Status: DC
  Administered 2022-08-15 – 2022-08-18 (×4): 40 mg via SUBCUTANEOUS
  Filled 2022-08-15 (×5): qty 0.4

## 2022-08-15 MED ORDER — CHLORHEXIDINE GLUCONATE CLOTH 2 % EX PADS
6.0000 | MEDICATED_PAD | Freq: Every day | CUTANEOUS | Status: DC
  Administered 2022-08-15 – 2022-09-01 (×18): 6 via TOPICAL

## 2022-08-15 MED ORDER — ACETAMINOPHEN 325 MG PO TABS
650.0000 mg | ORAL_TABLET | Freq: Four times a day (QID) | ORAL | Status: DC | PRN
  Administered 2022-08-20: 650 mg via ORAL
  Filled 2022-08-15 (×2): qty 2

## 2022-08-15 MED ORDER — OXYCODONE HCL 5 MG PO TABS: 5.0000 mg | ORAL_TABLET | ORAL | Status: DC | PRN

## 2022-08-15 MED ORDER — VANCOMYCIN HCL 750 MG/150ML IV SOLN
750.0000 mg | Freq: Two times a day (BID) | INTRAVENOUS | Status: DC
  Filled 2022-08-15: qty 150

## 2022-08-15 MED ORDER — SODIUM CHLORIDE 0.9 % IV SOLN
2.0000 g | Freq: Three times a day (TID) | INTRAVENOUS | Status: DC
  Administered 2022-08-15 – 2022-08-16 (×4): 2 g via INTRAVENOUS
  Filled 2022-08-15 (×4): qty 12.5

## 2022-08-15 MED ORDER — VANCOMYCIN HCL 1250 MG/250ML IV SOLN: 1250.0000 mg | INTRAVENOUS | Status: DC

## 2022-08-15 MED ORDER — IOHEXOL 350 MG/ML SOLN
60.0000 mL | Freq: Once | INTRAVENOUS | Status: AC | PRN
  Administered 2022-08-15: 60 mL via INTRAVENOUS

## 2022-08-15 MED ORDER — ATORVASTATIN CALCIUM 80 MG PO TABS
80.0000 mg | ORAL_TABLET | Freq: Every day | ORAL | Status: DC
  Administered 2022-08-18: 80 mg via ORAL
  Filled 2022-08-15: qty 1

## 2022-08-15 MED ORDER — THIAMINE MONONITRATE 100 MG PO TABS
100.0000 mg | ORAL_TABLET | Freq: Every day | ORAL | Status: DC
  Filled 2022-08-15: qty 1

## 2022-08-15 MED ORDER — ONDANSETRON HCL 4 MG/2ML IJ SOLN: 4.0000 mg | Freq: Four times a day (QID) | INTRAMUSCULAR | Status: DC | PRN

## 2022-08-15 MED ORDER — VANCOMYCIN HCL 1250 MG/250ML IV SOLN
1250.0000 mg | INTRAVENOUS | Status: DC
  Administered 2022-08-15: 1250 mg via INTRAVENOUS
  Filled 2022-08-15 (×2): qty 250

## 2022-08-15 MED ORDER — AMANTADINE HCL 100 MG PO CAPS
100.0000 mg | ORAL_CAPSULE | Freq: Two times a day (BID) | ORAL | Status: DC
  Administered 2022-08-18: 100 mg via ORAL
  Filled 2022-08-15 (×7): qty 1

## 2022-08-15 MED ORDER — GABAPENTIN 100 MG PO CAPS: 100.0000 mg | ORAL_CAPSULE | Freq: Two times a day (BID) | ORAL | Status: DC

## 2022-08-15 MED ORDER — FOLIC ACID 1 MG PO TABS: 1.0000 mg | ORAL_TABLET | Freq: Every day | ORAL | Status: DC

## 2022-08-15 NOTE — Progress Notes (Signed)
Pharmacy Antibiotic Note  Cindy Brooks is a 51 y.o. female admitted on 08/14/2022 with pneumonia.  Pharmacy has been consulted for Vancomycin dosing.    Vancomycin 1250 mg IV given in ED at 0130  WBC 8.3, afebrile SCr 1.13 > 1.07, stable  Plan: Change Vancomycin to 1250mg  IV q24h (eAUC ~490)    > Goal AUC 400-550    > Check vancomycin levels at steady state  Continue Cefepime 2g IV q8h per MD Continue Azithromycin 500mg  IV q24h per MD Monitor daily CBC, temp, SCr, and for clinical signs of improvement  F/u cultures and de-escalate antibiotics as able     Temp (24hrs), Avg:97.6 F (36.4 C), Min:96.6 F (35.9 C), Max:98.5 F (36.9 C)  Recent Labs  Lab 08/15/22 0005 08/15/22 0035 08/15/22 0242 08/15/22 0425 08/15/22 0849  WBC  --  7.1  --   --  8.3  CREATININE  --   --  1.13*  --  1.07*  LATICACIDVEN 2.3*  --   --  1.4  --      CrCl cannot be calculated (Unknown ideal weight.).    Allergies  Allergen Reactions   Aspirin Hives and Rash   Codeine Hives and Rash   Penicillins Anaphylaxis, Swelling and Other (See Comments)   Strawberry Extract Anaphylaxis and Hives   Antimicrobials this admission: Cefepime 11/2 >>  Vancomycin 11/2 >>  Azithromycin 11/2 >>  Metronidazole x1 11/2  Dose adjustments this admission: 11/2: Vancomycin 750mg  IV q12h >> 1250mg  IV q24h   Microbiology results: 11/2 BCx: NGTD < 12h 11/2 UCx: pending    Thank you for allowing pharmacy to be a part of this patient's care.  Luisa Hart, PharmD, BCPS Clinical Pharmacist 08/15/2022 11:59 AM   Please refer to AMION for pharmacy phone number

## 2022-08-15 NOTE — Progress Notes (Signed)
Patient admitted early this morning for severe sepsis secondary to community-acquired pneumonia and has been started on antibiotics and fluids.  I have reviewed patient's medical records including this morning's H&P, current vitals, labs and medications myself.  Patient seen and examined at bedside. She is very somnolent and wakes up only very slightly. Continue antibiotics.  Follow cultures. Follow CT chest ordered on admission.

## 2022-08-15 NOTE — Sepsis Progress Note (Signed)
Following for sepsis monitoring ?

## 2022-08-15 NOTE — Progress Notes (Addendum)
Pt arrived to floor around 1815 via stretcher by transporter. Pt alert but mute in no acute distress. Daughter states she is verbal with few words at baseline. Pt transferred from stretcher to hosp bed x 2 assist. VSS. Respirations even and unlabored on room air. Bed in low position. Call bell within reach. Daughter called with updates.

## 2022-08-15 NOTE — Consult Note (Signed)
Consultation Note Date: 08/15/2022   Patient Name: Cindy Brooks  DOB: [birth year]  MRN: [id number]  Age / Sex: 51 y.o., female  PCP: Hoy Register, MD Referring Physician: Glade Lloyd, MD  Reason for Consultation: Establishing goals of care  HPI/Patient Profile: 51 y.o. female  with past medical history of recurrent UTI, CVA with deficits, depression, chronic pain admitted on 08/14/2022 with cough and altered mental status.   Patient is admitted for severe sepsis due to community-acquired pneumonia, UTI.  PMT has been consulted to assist with goals of care conversation.  Clinical Assessment and Goals of Care:  I have reviewed medical records including EPIC notes, labs and imaging, assessed the patient and then had a phone conversation with patient's daughter/HCPOA [name] to discuss diagnosis prognosis, GOC, EOL wishes, disposition and options.  I introduced Palliative Medicine as specialized medical care for people living with serious illness. It focuses on providing relief from the symptoms and stress of a serious illness. The goal is to improve quality of life for both the patient and the family.  We discussed a brief life review of the patient and then focused on their current illness.   I attempted to elicit values and goals of care important to the patient.    Medical History Review and Understanding:  Reviewed patient's acute illness in detail, providing updates on labs, current interventions in place, and high risk for further decompensation.  Provided education on sepsis physiology and management.  Patient's daughter asked "can she die from this?"  and I shared that yes, unfortunately her prognosis is poor and patient is at higher risk to succumb to this illness given her poor baseline functional status.  Social History: Patient lives with her daughter. [name] reports that at baseline, patient can  talk a little bit and is more engaged.  Clarified further and she states that patient can answer yes/no questions.  Functional and Nutritional State: Patient is bedbound.  PEG tube in place.  Advance Directives: A detailed discussion regarding advanced directives was had.  Advance directives completed on 6/1 and on file in [place].  Patient's daughter [name] is primary HCPOA and Bedelia Pong is secondary HCPOA.  Code Status: Concepts specific to code status, artifical feeding and hydration, and rehospitalization were considered and discussed.  Patient's daughter shares it is her mother's wish to remain a full code.  Discussion: Provided updates on patient's condition during a phone conversation with her daughter.  She is understandably tearful and taken aback by the severity of patient's illness.  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  Counseled on the risk of recurrent complications and hospitalizations given patient's bedbound status.  I shared my worry that complications such as UTIs and pneumonias typically arise with increasing frequency when people are unable to move around for a prolonged period.  I encouraged her to consider how this will impact her mother's quality of life.  She plans to leave work and come to the hospital as well as update her siblings after our conversation.  Provided with PMT contact information.  She is agreeable to have ongoing goals of care discussions pending clinical course.   Discussed the importance of continued conversation with family and the medical providers regarding overall plan of care and treatment options, ensuring decisions are within the context of the patient's values and GOCs.   Questions and  concerns were addressed.  Hard Choices booklet left for review. The family was encouraged to call with questions or concerns.   PMT will continue to support holistically.   SUMMARY OF RECOMMENDATIONS   -Continue full code/full scope treatment -Patient's daughter did not have a good understanding of the severity of her illness and would like to update her family before further goals of care conversations -Psychosocial and emotional support provided -PMT will continue to follow and support   Prognosis:  Poor  Discharge Planning: To Be Determined      Primary Diagnoses: Present on Admission:  Community acquired bacterial pneumonia  Sepsis [facility])  Physical Exam Vitals and nursing note reviewed.  Constitutional:      Appearance: She is ill-appearing.  Cardiovascular:     Rate and Rhythm: Tachycardia present.  Pulmonary:     Effort: No respiratory distress.  Neurological:     Mental Status: She is unresponsive.    Vital Signs: BP (!) 121/97   Pulse (!) 109   Temp (!) 97.3 F (36.3 C) (Axillary)   Resp 20   LMP  (LMP Unknown)   SpO2 100%  Pain Scale: 0-10   Pain Score: Asleep   SpO2: SpO2: 100 % O2 Device:SpO2: 100 % O2 Flow Rate: .   Palliative Assessment/Data: 10%    MDM: High   Georgia Delsignore Johnnette Litter, PA-C  Palliative Medicine Team Team phone # [phone]  Thank you for allowing the Palliative Medicine Team to assist in the care of this patient. Please utilize secure chat with additional questions, if there is no response within 30 minutes please call the above phone number.  Palliative Medicine Team providers are available by phone from 7am to 7pm daily and can be reached through the team cell phone.  Should this patient require assistance outside of these hours, please call the patient's attending physician.

## 2022-08-15 NOTE — H&P (Signed)
History and Physical    Cindy Brooks [id number] DOB: April 05, 1971 DOA: 08/14/2022  PCP: Charlott Rakes, MD   Chief Complaint: cough  HPI: Cindy Brooks is a 51 y.o. female with medical history significant of recurrent UTI who presents emergency department due to cough and altered mental status.  Patient is nonverbal at baseline and bedbound due to prior stroke.  Her daughter apparently cares for her and knows that she had a cough and was not her usual self.  She Biotin-ratiopharm further evaluation.  Arrival to ED she was tachycardic in the 140s.  Blood pressures remained stable.  Labs were obtained which were notable for respiratory viral panel negative, lactic acid 2.3, WBC 7.1, hemoglobin 10.6, INR 1.1, urinalysis concerning for infection, sodium 149, bicarb 20, creatinine 1.1, chest x-ray showed unchanged cardiomegaly with small pleural effusion.  Cultures were obtained.  Patient was admitted for sepsis due to UTI/mucoid pneumonia.  On assessment of patient she was not able to respond to my questions however would open eyes to stimuli.  Family not at bedside to provide collateral information   Review of Systems: Review of Systems  All other systems reviewed and are negative.    As per HPI otherwise 10 point review of systems negative.   Allergies  Allergen Reactions   Aspirin Hives and Rash   Codeine Hives and Rash   Penicillins Anaphylaxis, Swelling and Other (See Comments)   Strawberry Extract Anaphylaxis and Hives    Past Medical History:  Diagnosis Date   Hypertension    Hypertensive urgency 01/20/2022   Seizure ([place]) 01/28/2022   Stroke ([place])    TIA (transient ischemic attack) 2019   Urinary tract infection 01/28/2022    Past Surgical History:  Procedure Laterality Date   ESOPHAGOGASTRODUODENOSCOPY N/A 01/28/2022   Procedure: ESOPHAGOGASTRODUODENOSCOPY (EGD);  Surgeon: Jesusita Oka, MD;  Location: [facility] ENDOSCOPY;  Service: General;  Laterality: N/A;   PEG  PLACEMENT N/A 01/28/2022   Procedure: PERCUTANEOUS ENDOSCOPIC GASTROSTOMY (PEG) PLACEMENT;  Surgeon: Jesusita Oka, MD;  Location: [facility] ENDOSCOPY;  Service: General;  Laterality: N/A;     reports that she has never smoked. She has never used smokeless tobacco. She reports that she does not currently use alcohol after a past usage of about 203.0 standard drinks of alcohol per week. No history on file for drug use.  Family History  Problem Relation Age of Onset   Diabetes Mellitus II Maternal Grandmother     Prior to Admission medications   Medication Sig Start Date End Date Taking? Authorizing Provider  acetaminophen (TYLENOL) 325 MG tablet Take 2 tablets (650 mg total) by mouth every 4 (four) hours as needed. Patient taking differently: Take 650 mg by mouth every 4 (four) hours as needed for mild pain. 03/21/22 03/21/23  Mercy Riding, MD  amantadine (SYMMETREL) 100 MG capsule Take 1 capsule (100 mg total) by mouth 2 (two) times daily. 03/21/22 05/26/22  Mercy Riding, MD  atorvastatin (LIPITOR) 80 MG tablet Take 1 tablet (80 mg total) by mouth daily. 05/14/22   Charlott Rakes, MD  chlorhexidine (PERIDEX) 0.12 % solution Use as directed 15 mLs in the mouth or throat 2 (two) times daily. 05/28/22   Georgette Shell, MD  clopidogrel (PLAVIX) 75 MG tablet Take 1 tablet (75 mg total) by mouth daily. 06/24/22   Charlott Rakes, MD  DULoxetine (CYMBALTA) 60 MG capsule Take 1 capsule (60 mg total) by mouth daily. 05/14/22   Charlott Rakes, MD  Ensure Plus (ENSURE PLUS)  LIQD Take 237 mLs by mouth 3 (three) times daily between meals.    [provider]  folic acid (FOLVITE) 1 MG tablet Take 1 tablet (1 mg total) by mouth daily. 03/21/22   Mercy Riding, MD  gabapentin (NEURONTIN) 100 MG capsule Take 1 capsule (100 mg total) by mouth 2 (two) times daily. 05/14/22   Charlott Rakes, MD  ipratropium-albuterol (DUONEB) 0.5-2.5 (3) MG/3ML SOLN Take 3 mLs by nebulization every 6 (six) hours as needed. Patient  not taking: Reported on 06/14/2022 03/21/22   Mercy Riding, MD  Misc. Devices MISC Urinalysis, urine culture and sensitivity 07/29/22   Charlott Rakes, MD  Multiple Vitamin (MULTIVITAMIN WITH MINERALS) TABS tablet Take 1 tablet by mouth daily. 03/21/22   Mercy Riding, MD  Nystatin (GERHARDT'S BUTT CREAM) CREA Apply 1 application. topically daily as needed for irritation. 03/21/22   Mercy Riding, MD  nystatin (MYCOSTATIN) 100000 UNIT/ML suspension Take 5 mLs (500,000 Units total) by mouth 4 (four) times daily. 05/28/22   Georgette Shell, MD  polyethylene glycol powder [facility]) 17 GM/SCOOP powder Take 17 g by mouth 2 (two) times daily as needed for moderate constipation. Patient not taking: Reported on 06/14/2022 03/21/22   Mercy Riding, MD  propranolol (INDERAL) 10 MG tablet Take 1 tablet (10 mg total) by mouth 3 (three) times daily. 06/24/22   Charlott Rakes, MD  senna-docusate (SENOKOT-S) 8.6-50 MG tablet Take 1 tablet by mouth 2 (two) times daily between meals as needed for mild constipation. 03/21/22   Mercy Riding, MD  thiamine 100 MG tablet Take 1 tablet (100 mg total) by mouth daily. 03/21/22   Mercy Riding, MD    Physical Exam: Vitals:   08/15/22 0430 08/15/22 0433 08/15/22 0445 08/15/22 0500  BP: (!) 133/99  (!) 128/99 (!) 131/102  Pulse: (!) 129     Resp: (!) 24  (!) 28 (!) 23  Temp:  98.5 F (36.9 C)    TempSrc:  Axillary    SpO2: 100%      Physical Exam Vitals reviewed.  Constitutional:      Appearance: She is ill-appearing.  HENT:     Nose: Nose normal.     Mouth/Throat:     Mouth: Mucous membranes are moist.     Pharynx: Oropharynx is clear.  Cardiovascular:     Rate and Rhythm: Tachycardia present.  Pulmonary:     Effort: Pulmonary effort is normal.     Breath sounds: Normal breath sounds.  Abdominal:     General: Abdomen is flat.  Musculoskeletal:     Cervical back: Rigidity present.  Skin:    General: Skin is warm.  Neurological:     Mental Status: Mental  status is at baseline.      Labs on Admission: I have personally reviewed the patients's labs and imaging studies.  Assessment/Plan Principal Problem:   Community acquired bacterial pneumonia   Severe sepsis secondary most likely to bacterial community acquired pneumonia, urinary tract infection, POA, active - Patient found to have lactic acidosis 2.3 - Urinalysis concerning for infection as well as opacities on chest x-ray - Patient tachycardic and profoundly dehydrated Plan: volume resuscitation Continue broad-spectrum antibiotics Trend LA  Prior CVA with prior residual deficits-continue Plavix  Depression-continue Cymbalta Chronic pain-continue gabapentin  Hyponatremia-continue IV fluids  Admission status: Inpatient Med-Surg  Certification: The appropriate patient status for this patient is INPATIENT. Inpatient status is judged to be reasonable and necessary in order to provide the  required intensity of service to ensure the patient's safety. The patient's presenting symptoms, physical exam findings, and initial radiographic and laboratory data in the context of their chronic comorbidities is felt to place them at high risk for further clinical deterioration. Furthermore, it is not anticipated that the patient will be medically stable for discharge from the hospital within 2 midnights of admission.   * I certify that at the point of admission it is my clinical judgment that the patient will require inpatient hospital care spanning beyond 2 midnights from the point of admission due to high intensity of service, high risk for further deterioration and high frequency of surveillance required.Emilee Hero MD [facility] If 7PM-7AM, please contact night-coverage [email or web address]  08/15/2022, 5:07 AM

## 2022-08-15 NOTE — Progress Notes (Signed)
Pharmacy Antibiotic Note  Cindy Brooks is a 51 y.o. female admitted on 08/14/2022 with pneumonia.  Pharmacy has been consulted for Vancomycin  dosing.  Vancomycin 1250 mg IV given in ED at [place]: Vancomycin 750 mg IV q12h    Temp (24hrs), Avg:98.5 F (36.9 C), Min:98.5 F (36.9 C), Max:98.5 F (36.9 C)  Recent Labs  Lab 08/15/22 0005 08/15/22 0035 08/15/22 0242 08/15/22 0425  WBC  --  7.1  --   --   CREATININE  --   --  1.13*  --   LATICACIDVEN 2.3*  --   --  1.4    CrCl cannot be calculated (Unknown ideal weight.).    Allergies  Allergen Reactions   Aspirin Hives and Rash   Codeine Hives and Rash   Penicillins Anaphylaxis, Swelling and Other (See Comments)   Strawberry Extract Anaphylaxis and Hives    Caryl Pina 08/15/2022 5:31 AM

## 2022-08-15 NOTE — ED Provider Notes (Signed)
[facility]ERGENCY DEPARTMENT Provider Note   CSN: [id number] Arrival date & time: 08/14/22  2323     History  Chief Complaint  Patient presents with   Cough    Cindy Brooks is a 51 y.o. female.  The history is provided by the EMS personnel. The history is limited by the condition of the patient (patient is aphasic).  Cough Cough characteristics: unable. Severity:  Moderate Onset quality:  Gradual Timing:  Constant Progression:  Unchanged Chronicity:  New Smoker: no   Relieved by:  Nothing Worsened by:  Nothing Associated symptoms: no fever and no wheezing   Associated symptoms comment:  Refusing to take medications at home per report  Patient with aphasic from previous stroke presents with cough and ongoing UTI.      Past Medical History:  Diagnosis Date   Hypertension    Hypertensive urgency 01/20/2022   Seizure ([place]) 01/28/2022   Stroke [facility])    TIA (transient ischemic attack) 2019   Urinary tract infection 01/28/2022     Home Medications Prior to Admission medications   Medication Sig Start Date End Date Taking? Authorizing Provider  acetaminophen (TYLENOL) 325 MG tablet Take 2 tablets (650 mg total) by mouth every 4 (four) hours as needed. Patient taking differently: Take 650 mg by mouth every 4 (four) hours as needed for mild pain. 03/21/22 03/21/23  Mercy Riding, MD  amantadine (SYMMETREL) 100 MG capsule Take 1 capsule (100 mg total) by mouth 2 (two) times daily. 03/21/22 05/26/22  Mercy Riding, MD  atorvastatin (LIPITOR) 80 MG tablet Take 1 tablet (80 mg total) by mouth daily. 05/14/22   Charlott Rakes, MD  chlorhexidine (PERIDEX) 0.12 % solution Use as directed 15 mLs in the mouth or throat 2 (two) times daily. 05/28/22   Georgette Shell, MD  clopidogrel (PLAVIX) 75 MG tablet Take 1 tablet (75 mg total) by mouth daily. 06/24/22   Charlott Rakes, MD  DULoxetine (CYMBALTA) 60 MG capsule Take 1 capsule (60 mg total) by mouth daily. 05/14/22    Charlott Rakes, MD  Ensure Plus (ENSURE PLUS) LIQD Take 237 mLs by mouth 3 (three) times daily between meals.    [provider]  folic acid (FOLVITE) 1 MG tablet Take 1 tablet (1 mg total) by mouth daily. 03/21/22   Mercy Riding, MD  gabapentin (NEURONTIN) 100 MG capsule Take 1 capsule (100 mg total) by mouth 2 (two) times daily. 05/14/22   Charlott Rakes, MD  ipratropium-albuterol (DUONEB) 0.5-2.5 (3) MG/3ML SOLN Take 3 mLs by nebulization every 6 (six) hours as needed. Patient not taking: Reported on 06/14/2022 03/21/22   Mercy Riding, MD  Misc. Devices MISC Urinalysis, urine culture and sensitivity 07/29/22   Charlott Rakes, MD  Multiple Vitamin (MULTIVITAMIN WITH MINERALS) TABS tablet Take 1 tablet by mouth daily. 03/21/22   Mercy Riding, MD  Nystatin (GERHARDT'S BUTT CREAM) CREA Apply 1 application. topically daily as needed for irritation. 03/21/22   Mercy Riding, MD  nystatin (MYCOSTATIN) 100000 UNIT/ML suspension Take 5 mLs (500,000 Units total) by mouth 4 (four) times daily. 05/28/22   Georgette Shell, MD  polyethylene glycol powder [facility]) 17 GM/SCOOP powder Take 17 g by mouth 2 (two) times daily as needed for moderate constipation. Patient not taking: Reported on 06/14/2022 03/21/22   Mercy Riding, MD  propranolol (INDERAL) 10 MG tablet Take 1 tablet (10 mg total) by mouth 3 (three) times daily. 06/24/22   Charlott Rakes, MD  senna-docusate (SENOKOT-S) 8.6-50 MG tablet Take 1 tablet by mouth 2 (two) times daily between meals as needed for mild constipation. 03/21/22   Mercy Riding, MD  thiamine 100 MG tablet Take 1 tablet (100 mg total) by mouth daily. 03/21/22   Mercy Riding, MD      Allergies    Aspirin, Codeine, Penicillins, and Strawberry extract    Review of Systems   Review of Systems  Unable to perform ROS: Acuity of condition (patient is aphasic)  Constitutional:  Negative for fever.  Eyes:  Negative for redness.  Respiratory:  Positive for cough. Negative for  wheezing.     Physical Exam Updated Vital Signs BP (!) 131/95 (BP Location: Right Arm)   Pulse (!) 134   Resp (!) 30   LMP  (LMP Unknown)   SpO2 97%  Physical Exam Vitals and nursing note reviewed.  Constitutional:      Appearance: She is well-developed. She is not diaphoretic.  HENT:     Head: Normocephalic and atraumatic.     Nose: Nose normal.  Eyes:     Conjunctiva/sclera: Conjunctivae normal.     Pupils: Pupils are equal, round, and reactive to light.  Cardiovascular:     Rate and Rhythm: Regular rhythm. Tachycardia present.     Pulses: Normal pulses.     Heart sounds: Normal heart sounds.  Pulmonary:     Effort: Pulmonary effort is normal. No respiratory distress.     Breath sounds: Normal breath sounds.  Abdominal:     General: Bowel sounds are normal. There is no distension.     Palpations: Abdomen is soft.     Tenderness: There is no abdominal tenderness. There is no guarding or rebound.  Genitourinary:    Vagina: No vaginal discharge.  Musculoskeletal:        General: Normal range of motion.     Cervical back: Normal range of motion and neck supple. No rigidity.  Lymphadenopathy:     Cervical: No cervical adenopathy.  Skin:    General: Skin is warm and dry.     Capillary Refill: Capillary refill takes less than 2 seconds.     Findings: No erythema or rash.  Neurological:     Mental Status: She is alert.  Psychiatric:     Comments: Unable      ED Results / Procedures / Treatments   Labs (all labs ordered are listed, but only abnormal results are displayed) Results for orders placed or performed during the hospital encounter of 08/14/22  Resp Panel by RT-PCR (Flu A&B, Covid) Anterior Nasal Swab   Specimen: Anterior Nasal Swab  Result Value Ref Range   SARS Coronavirus 2 by RT PCR NEGATIVE NEGATIVE   Influenza A by PCR NEGATIVE NEGATIVE   Influenza B by PCR NEGATIVE NEGATIVE  Lactic acid, plasma  Result Value Ref Range   Lactic Acid, Venous 2.3 (HH)  0.5 - 1.9 mmol/L  CBC with Differential  Result Value Ref Range   WBC 7.1 4.0 - 10.5 K/uL   RBC 3.76 (L) 3.87 - 5.11 MIL/uL   Hemoglobin 10.6 (L) 12.0 - 15.0 g/dL   HCT 32.5 (L) 36.0 - 46.0 %   MCV 86.4 80.0 - 100.0 fL   MCH 28.2 26.0 - 34.0 pg   MCHC 32.6 30.0 - 36.0 g/dL   RDW 16.2 (H) 11.5 - 15.5 %   Platelets 304 150 - 400 K/uL   nRBC 0.0 0.0 - 0.2 %   Neutrophils Relative %  65 %   Neutro Abs 4.7 1.7 - 7.7 K/uL   Lymphocytes Relative 27 %   Lymphs Abs 1.9 0.7 - 4.0 K/uL   Monocytes Relative 6 %   Monocytes Absolute 0.4 0.1 - 1.0 K/uL   Eosinophils Relative 0 %   Eosinophils Absolute 0.0 0.0 - 0.5 K/uL   Basophils Relative 1 %   Basophils Absolute 0.0 0.0 - 0.1 K/uL   Immature Granulocytes 1 %   Abs Immature Granulocytes 0.04 0.00 - 0.07 K/uL  Protime-INR  Result Value Ref Range   Prothrombin Time 14.2 11.4 - 15.2 seconds   INR 1.1 0.8 - 1.2  APTT  Result Value Ref Range   aPTT 28 24 - 36 seconds  Urinalysis, Routine w reflex microscopic  Result Value Ref Range   Color, Urine YELLOW YELLOW   APPearance HAZY (A) CLEAR   Specific Gravity, Urine 1.016 1.005 - 1.030   pH 5.0 5.0 - 8.0   Glucose, UA NEGATIVE NEGATIVE mg/dL   Hgb urine dipstick SMALL (A) NEGATIVE   Bilirubin Urine NEGATIVE NEGATIVE   Ketones, ur 20 (A) NEGATIVE mg/dL   Protein, ur 30 (A) NEGATIVE mg/dL   Nitrite NEGATIVE NEGATIVE   Leukocytes,Ua MODERATE (A) NEGATIVE   RBC / HPF 6-10 0 - 5 RBC/hpf   WBC, UA 11-20 0 - 5 WBC/hpf   Bacteria, UA RARE (A) NONE SEEN   Squamous Epithelial / LPF 0-5 0 - 5   Mucus PRESENT    DG Chest Port 1 View  Result Date: 08/14/2022 CLINICAL DATA:  Questionable sepsis EXAM: PORTABLE CHEST 1 VIEW COMPARISON:  Chest x-ray 08/04/2022. FINDINGS: Cardiac silhouette is enlarged, unchanged. Blunting of the left costophrenic angle and retrocardiac opacities persist. The right lung is clear. No pneumothorax or acute fracture. IMPRESSION: Unchanged cardiomegaly with small left  pleural effusion and retrocardiac atelectasis versus consolidation. Electronically Signed   By: Ronney Asters M.D.   On: 08/14/2022 23:44   DG CHEST PORT 1 VIEW  Result Date: 08/04/2022 CLINICAL DATA:  Altered mental status EXAM: PORTABLE CHEST 1 VIEW COMPARISON:  Chest radiograph dated 05/25/2022 FINDINGS: Low lung volumes. Left retrocardiac opacities. No pneumothorax. No layering pleural effusion. Left heart border is obscured. The visualized skeletal structures are unremarkable. IMPRESSION: Left retrocardiac opacities may represent atelectasis, aspiration, or pneumonia. Electronically Signed   By: Darrin Nipper M.D.   On: 08/04/2022 16:12   CT Head Wo Contrast  Result Date: 08/04/2022 CLINICAL DATA:  Delirium ams EXAM: CT HEAD WITHOUT CONTRAST TECHNIQUE: Contiguous axial images were obtained from the base of the skull through the vertex without intravenous contrast. RADIATION DOSE REDUCTION: This exam was performed according to the departmental dose-optimization program which includes automated exposure control, adjustment of the mA and/or kV according to patient size and/or use of iterative reconstruction technique. COMPARISON:  CT head 05/26/2022 BRAIN: BRAIN Patchy and confluent areas of decreased attenuation are noted throughout the deep and periventricular white matter of the cerebral hemispheres bilaterally, compatible with chronic microvascular ischemic disease. Bilateral chronic basal ganglia and thalamic lacunar infarctions. No evidence of large-territorial acute infarction. No parenchymal hemorrhage. No mass lesion. No extra-axial collection. Cavum septum vergae variation. No mass effect or midline shift. No hydrocephalus. Basilar cisterns are patent. Vascular: No hyperdense vessel. Skull: No acute fracture or focal lesion. Sinuses/Orbits: Paranasal sinuses and mastoid air cells are clear. The orbits are unremarkable. Other: None. IMPRESSION: No acute intracranial abnormality. Electronically  Signed   By: Iven Finn M.D.   On: 08/04/2022  16:05     EKG  EKG Interpretation  Date/Time:  Wednesday August 14 2022 23:26:53 EDT Ventricular Rate:  127 PR Interval:  156 QRS Duration: 70 QT Interval:  304 QTC Calculation: 442 R Axis:   212 Text Interpretation: Sinus tachycardia Anterolateral infarct, old Confirmed by Randal Buba, Marilynn Ekstein ([id number]) on 08/15/2022 3:18:31 AM         Radiology DG Chest Port 1 View  Result Date: 08/14/2022 CLINICAL DATA:  Questionable sepsis EXAM: PORTABLE CHEST 1 VIEW COMPARISON:  Chest x-ray 08/04/2022. FINDINGS: Cardiac silhouette is enlarged, unchanged. Blunting of the left costophrenic angle and retrocardiac opacities persist. The right lung is clear. No pneumothorax or acute fracture. IMPRESSION: Unchanged cardiomegaly with small left pleural effusion and retrocardiac atelectasis versus consolidation. Electronically Signed   By: Ronney Asters M.D.   On: 08/14/2022 23:44    Procedures Procedures    Medications Ordered in ED Medications  lactated ringers infusion ( Intravenous New Bag/Given 08/15/22 0122)  lactated ringers bolus 1,000 mL (1,000 mLs Intravenous New Bag/Given 08/15/22 0015)  metroNIDAZOLE (FLAGYL) IVPB 500 mg (0 mg Intravenous Stopped 08/15/22 0247)  ceFEPIme (MAXIPIME) 2 g in sodium chloride 0.9 % 100 mL IVPB (0 g Intravenous Stopped 08/15/22 0102)  vancomycin (VANCOREADY) IVPB 1250 mg/250 mL (1,250 mg Intravenous New Bag/Given 08/15/22 0127)    ED Course/ Medical Decision Making/ A&P                           Medical Decision Making Patient with cough and ongoing UTI, also reportedly refusing to take home medications  Amount and/or Complexity of Data Reviewed Independent Historian: EMS    Details: See above  External Data Reviewed: notes.    Details: Previous ED notes reviewed  Labs: ordered.    Details: All labs reviewed:  negative covid and flu. Elevated lactate 2.3, normal white count 7.1, low hemoglobin 10.6, normal  platelet count.  Urine is consistent with UTI. Sodium elevated 149, normal potassium and normal creatinine 1.13  Radiology: ordered and independent interpretation performed.    Details: No pulmonary edema by me  ECG/medicine tests: ordered and independent interpretation performed. Decision-making details documented in ED Course.  Risk Prescription drug management. Decision regarding hospitalization.    Final Clinical Impression(s) / ED Diagnoses Final diagnoses:  Sepsis, due to unspecified organism, unspecified whether acute organ dysfunction present [facility])  Urinary tract infection without hematuria, site unspecified  Pneumonia due to infectious organism, unspecified laterality, unspecified part of lung   The patient appears reasonably stabilized for admission considering the current resources, flow, and capabilities available in the ED at this time, and I doubt any other [facility] requiring further screening and/or treatment in the ED prior to admission.  Rx / DC Orders ED Discharge Orders     None         Suda Forbess, MD 08/15/22 [id number]

## 2022-08-15 NOTE — ED Notes (Signed)
Patient resting in bed, no s/s of any distress. No verbal c/o pain or discomfort. VSS. Report given to oncoming nurse.  

## 2022-08-16 ENCOUNTER — Inpatient Hospital Stay (HOSPITAL_COMMUNITY): Payer: No Typology Code available for payment source

## 2022-08-16 DIAGNOSIS — N390 Urinary tract infection, site not specified: Secondary | ICD-10-CM | POA: Diagnosis not present

## 2022-08-16 DIAGNOSIS — A419 Sepsis, unspecified organism: Secondary | ICD-10-CM | POA: Diagnosis not present

## 2022-08-16 DIAGNOSIS — Z7189 Other specified counseling: Secondary | ICD-10-CM | POA: Diagnosis not present

## 2022-08-16 DIAGNOSIS — J159 Unspecified bacterial pneumonia: Secondary | ICD-10-CM | POA: Diagnosis not present

## 2022-08-16 LAB — CBC
HCT: 24 % — ABNORMAL LOW (ref 36.0–46.0)
Hemoglobin: 7.9 g/dL — ABNORMAL LOW (ref 12.0–15.0)
MCH: 27.7 pg (ref 26.0–34.0)
MCHC: 32.9 g/dL (ref 30.0–36.0)
MCV: 84.2 fL (ref 80.0–100.0)
Platelets: 187 10*3/uL (ref 150–400)
RBC: 2.85 MIL/uL — ABNORMAL LOW (ref 3.87–5.11)
RDW: 16.2 % — ABNORMAL HIGH (ref 11.5–15.5)
WBC: 7.9 10*3/uL (ref 4.0–10.5)
nRBC: 0 % (ref 0.0–0.2)

## 2022-08-16 LAB — BASIC METABOLIC PANEL
Anion gap: 16 — ABNORMAL HIGH (ref 5–15)
BUN: 10 mg/dL (ref 6–20)
CO2: 16 mmol/L — ABNORMAL LOW (ref 22–32)
Calcium: 9.3 mg/dL (ref 8.9–10.3)
Chloride: 114 mmol/L — ABNORMAL HIGH (ref 98–111)
Creatinine, Ser: 1.08 mg/dL — ABNORMAL HIGH (ref 0.44–1.00)
GFR, Estimated: >60 mL/min (ref >60)
Glucose, Bld: 63 mg/dL — ABNORMAL LOW (ref 70–99)
Potassium: 4.5 mmol/L (ref 3.5–5.1)
Sodium: 146 mmol/L — ABNORMAL HIGH (ref 135–145)

## 2022-08-16 LAB — MAGNESIUM: Magnesium: 2.2 mg/dL (ref 1.7–2.4)

## 2022-08-16 MED ORDER — THIAMINE HCL 100 MG/ML IJ SOLN
100.0000 mg | Freq: Every day | INTRAMUSCULAR | Status: DC
  Administered 2022-08-16 – 2022-08-30 (×15): 100 mg via INTRAVENOUS
  Filled 2022-08-16 (×15): qty 2

## 2022-08-16 MED ORDER — SODIUM CHLORIDE 0.9 % IV SOLN
1.0000 g | Freq: Two times a day (BID) | INTRAVENOUS | Status: DC
  Administered 2022-08-16 – 2022-08-19 (×6): 1 g via INTRAVENOUS
  Filled 2022-08-16 (×8): qty 10

## 2022-08-16 MED ORDER — SODIUM CHLORIDE 0.9 % IV SOLN: 1.0000 mg | Freq: Once | INTRAVENOUS | Status: DC

## 2022-08-16 MED ORDER — FOLIC ACID 5 MG/ML IJ SOLN
1.0000 mg | Freq: Once | INTRAMUSCULAR | Status: AC
  Administered 2022-08-16: 1 mg via INTRAVENOUS
  Filled 2022-08-16: qty 0.2

## 2022-08-16 MED ORDER — SODIUM BICARBONATE 8.4 % IV SOLN
INTRAVENOUS | Status: DC
  Filled 2022-08-16 (×3): qty 1000

## 2022-08-16 NOTE — Progress Notes (Addendum)
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  On presentation, she was tachycardic with lactic acid of 2.3, WBC of 7.1, sodium 149.  Chest x-ray showed unchanged cardiomegaly with small pleural effusion.  UA was concerning for UTI.  COVID-19 and influenza test were negative.  She was started on IV fluids and antibiotics.  Assessment & Plan:   Possible community-acquired bacterial pneumonia Mucoid debris in the left-sided airways with postobstructive collapse of left upper and lower lobes -Presented with cough.  CTA chest was negative for PE but showed mucoid debris in the left sided airways with left upper lobe and left lower lobe collapse.  Discussed with pulmonary via secure chat on 08/15/2022 who recommended aggressive chest physiotherapy.  If respiratory status worsens, they can be officially involved.  I have consulted respiratory therapy. -Initially started on broad-spectrum antibiotics.  DC vancomycin.  Continue cefepime and Zithromax. -COVID-19 and influenza testing negative on presentation.  Cultures negative so far.  Severe sepsis: Present on admission -Possibly from pneumonia and UTI -Hemodynamically improving.  Follow cultures  UTI, possibly associated with Foley catheter Chronic urinary retention with chronic indwelling Foley catheter -Foley catheter apparently changed within the last 2 weeks. -Continue antibiotics as above.  Follow cultures.  Outpatient follow-up with urology.  Acute metabolic encephalopathy Prior CVA, unspecified with prior residual deficits Goals of care -Patient presented with worsening mental status changes.  Almost nonverbal at baseline and  mostly bedbound due to prior stroke. -Mental status still not improving.  Still hardly responsive.   -Gabapentin on hold.  Cymbalta on hold -Palliative care following.  Overall prognosis is poor.  Patient remains full code. -PT/SLP eval -Check CT of the head.  EEG  Hyponatremia -Resolved  Hypernatremia -Possibly from dehydration.  Switch IV fluids as below.  Repeat a.m. labs  Acute metabolic acidosis -Start bicarb drip.  Repeat a.m. labs  Anemia of chronic disease -From chronic illnesses.  Hemoglobin slightly drifting down.  No signs of bleeding.  Monitor intermittently.   DVT prophylaxis: Lovenox Code Status: Full Family Communication: Spoke to daughters on phone Disposition Plan: Status is: Inpatient Remains inpatient appropriate because: Of severity of illness    Consultants: Palliative care  Procedures: None  Antimicrobials:  Anti-infectives (From admission, onward)    Start     Dose/Rate Route Frequency Ordered Stop   08/16/22 2300  vancomycin (VANCOREADY) IVPB 1250 mg/250 mL  Status:  Discontinued        1,250 mg 166.7 mL/hr over 90 Minutes Intravenous Every 24 hours 08/15/22 1157 08/15/22 1157   08/15/22 2300  vancomycin (VANCOREADY) IVPB 1250 mg/250 mL  Status:  Discontinued        1,250 mg 166.7 mL/hr over 90 Minutes Intravenous Every 24 hours 08/15/22 1157 08/16/22 1121   08/15/22 1100  azithromycin (ZITHROMAX) 500 mg in sodium chloride 0.9 % 250 mL IVPB        500 mg 250 mL/hr over 60 Minutes Intravenous Every 24 hours 08/15/22 1049     08/15/22 1000  vancomycin (VANCOREADY) IVPB 750 mg/150 mL  Status:  Discontinued        750 mg 150 mL/hr over 60 Minutes Intravenous Every 12 hours 08/15/22 0533 08/15/22 1157   08/15/22 0800  ceFEPIme (MAXIPIME)  2 g in sodium chloride 0.9 % 100 mL IVPB        2 g 200 mL/hr over 30 Minutes Intravenous Every 8 hours 08/15/22 0528     08/14/22 2345  aztreonam (AZACTAM) 2 g in sodium chloride 0.9 % 100 mL IVPB  Status:   Discontinued        2 g 200 mL/hr over 30 Minutes Intravenous  Once 08/14/22 2331 08/14/22 2334   08/14/22 2345  metroNIDAZOLE (FLAGYL) IVPB 500 mg        500 mg 100 mL/hr over 60 Minutes Intravenous  Once 08/14/22 2331 08/15/22 0247   08/14/22 2345  vancomycin (VANCOCIN) IVPB 1000 mg/200 mL premix  Status:  Discontinued        1,000 mg 200 mL/hr over 60 Minutes Intravenous  Once 08/14/22 2331 08/14/22 2334   08/14/22 2345  ceFEPIme (MAXIPIME) 2 g in sodium chloride 0.9 % 100 mL IVPB        2 g 200 mL/hr over 30 Minutes Intravenous  Once 08/14/22 2334 08/15/22 0102   08/14/22 2345  vancomycin (VANCOREADY) IVPB 1250 mg/250 mL        1,250 mg 166.7 mL/hr over 90 Minutes Intravenous  Once 08/14/22 2334 08/15/22 0316        Subjective: Patient seen and examined at bedside.  Wakes up only very minimally, hardly responds to questions.  No seizures, vomiting, fever reported.  Objective: Vitals:   08/15/22 1824 08/15/22 2000 08/15/22 2317 08/16/22 0821  BP: (!) 131/95 (!) 125/97 (!) 139/98 (!) 146/103  Pulse: (!) 101 (!) 106 96 (!) 110  Resp: (!) 24 20 14 18   Temp: 97.9 F (36.6 C) 98 F (36.7 C) 97.9 F (36.6 C) 98.6 F (37 C)  TempSrc: Axillary Axillary Axillary Oral  SpO2: 100%   99%  Weight: 48.8 kg     Height: 5\' 4"  (1.626 m)       Intake/Output Summary (Last 24 hours) at 08/16/2022 1132 Last data filed at 08/16/2022 13/12/2021 Gross per 24 hour  Intake 1444.49 ml  Output 700 ml  Net 744.49 ml   Filed Weights   08/15/22 1824  Weight: 48.8 kg    Examination:  General exam: Appears calm and comfortable.  Looks little ill and deconditioned.  Currently on room air. Respiratory system: Bilateral decreased breath sounds at bases with scattered crackles Cardiovascular system: S1 & S2 heard, Rate controlled Gastrointestinal system: Abdomen is nondistended, soft and nontender. Normal bowel sounds heard. Extremities: No cyanosis, clubbing; trace lower extremity edema Central  nervous system:  Wakes up only very minimally, hardly responds to questions.    Skin: No rashes, lesions or ulcers Psychiatry: Could not be assessed because of mental status.  Currently not agitated.    Data Reviewed: I have personally reviewed following labs and imaging studies  CBC: Recent Labs  Lab 08/15/22 0035 08/15/22 0849 08/16/22 0641  WBC 7.1 8.3 7.9  NEUTROABS 4.7  --   --   HGB 10.6* 8.4* 7.9*  HCT 32.5* 25.5* 24.0*  MCV 86.4 85.6 84.2  PLT 304 217 187   Basic Metabolic Panel: Recent Labs  Lab 08/15/22 0242 08/15/22 0849 08/16/22 0508  NA 149*  --  146*  K 3.5  --  4.5  CL 114*  --  114*  CO2 20*  --  16*  GLUCOSE 81  --  63*  BUN 12  --  10  CREATININE 1.13* 1.07* 1.08*  CALCIUM 9.5  --  9.3  MG  --  1.6* 2.2   GFR: Estimated Creatinine Clearance: 47.5 mL/min (A) (by C-G formula based on SCr of 1.08 mg/dL (H)). Liver Function Tests: Recent Labs  Lab 08/15/22 0242  AST 16  ALT 9  ALKPHOS 60  BILITOT 1.0  PROT 5.6*  ALBUMIN 2.9*   No results for input(s): "LIPASE", "AMYLASE" in the last 168 hours. No results for input(s): "AMMONIA" in the last 168 hours. Coagulation Profile: Recent Labs  Lab 08/15/22 0035  INR 1.1   Cardiac Enzymes: No results for input(s): "CKTOTAL", "CKMB", "CKMBINDEX", "TROPONINI" in the last 168 hours. BNP (last 3 results) No results for input(s): "PROBNP" in the last 8760 hours. HbA1C: No results for input(s): "HGBA1C" in the last 72 hours. CBG: Recent Labs  Lab 08/15/22 0847  GLUCAP 80   Lipid Profile: No results for input(s): "CHOL", "HDL", "LDLCALC", "TRIG", "CHOLHDL", "LDLDIRECT" in the last 72 hours. Thyroid Function Tests: No results for input(s): "TSH", "T4TOTAL", "FREET4", "T3FREE", "THYROIDAB" in the last 72 hours. Anemia Panel: No results for input(s): "VITAMINB12", "FOLATE", "FERRITIN", "TIBC", "IRON", "RETICCTPCT" in the last 72 hours. Sepsis Labs: Recent Labs  Lab 08/15/22 0005 08/15/22 0425   LATICACIDVEN 2.3* 1.4    Recent Results (from the past 240 hour(s))  Resp Panel by RT-PCR (Flu A&B, Covid) Anterior Nasal Swab     Status: None   Collection Time: 08/15/22 12:05 AM   Specimen: Anterior Nasal Swab  Result Value Ref Range Status   SARS Coronavirus 2 by RT PCR NEGATIVE NEGATIVE Final    Comment: (NOTE) SARS-CoV-2 target nucleic acids are NOT DETECTED.  The SARS-CoV-2 RNA is generally detectable in upper respiratory specimens during the acute phase of infection. The lowest concentration of SARS-CoV-2 viral copies this assay can detect is 138 copies/mL. A negative result does not preclude SARS-Cov-2 infection and should not be used as the sole basis for treatment or other patient management decisions. A negative result may occur with  improper specimen collection/handling, submission of specimen other than nasopharyngeal swab, presence of viral mutation(s) within the areas targeted by this assay, and inadequate number of viral copies(<138 copies/mL). A negative result must be combined with clinical observations, patient history, and epidemiological information. The expected result is Negative.  Fact Sheet for Patients:  BloggerCourse.comhttps://www.fda.gov/media/152166/download  Fact Sheet for Healthcare Providers:  SeriousBroker.ithttps://www.fda.gov/media/152162/download  This test is no t yet approved or cleared by the [place] FDA and  has been authorized for detection and/or diagnosis of SARS-CoV-2 by FDA under an Emergency Use Authorization (EUA). This EUA will remain  in effect (meaning this test can be used) for the duration of the COVID-19 declaration under Section 564(b)(1) of the Act, 21 U.S.C.section 360bbb-3(b)(1), unless the authorization is terminated  or revoked sooner.       Influenza A by PCR NEGATIVE NEGATIVE Final   Influenza B by PCR NEGATIVE NEGATIVE Final    Comment: (NOTE) The Xpert Xpress SARS-CoV-2/FLU/RSV plus assay is intended as an aid in the diagnosis of  influenza from Nasopharyngeal swab specimens and should not be used as a sole basis for treatment. Nasal washings and aspirates are unacceptable for Xpert Xpress SARS-CoV-2/FLU/RSV testing.  Fact Sheet for Patients: BloggerCourse.comhttps://www.fda.gov/media/152166/download  Fact Sheet for Healthcare Providers: SeriousBroker.ithttps://www.fda.gov/media/152162/download  This test is not yet approved or cleared by the [place] FDA and has been authorized for detection and/or diagnosis of SARS-CoV-2 by FDA under an Emergency Use Authorization (EUA). This EUA will remain in effect (meaning this test can be used) for the  duration of the COVID-19 declaration under Section 564(b)(1) of the Act, 21 U.S.C. section 360bbb-3(b)(1), unless the authorization is terminated or revoked.  Performed at [facility], [address], [place], [place] [address]   Blood Culture (routine x 2)     Status: None (Preliminary result)   Collection Time: 08/15/22 12:08 AM   Specimen: BLOOD RIGHT HAND  Result Value Ref Range Status   Specimen Description BLOOD RIGHT HAND  Final   Special Requests   Final    BOTTLES DRAWN AEROBIC AND ANAEROBIC Blood Culture adequate volume   Culture   Final    NO GROWTH 1 DAY Performed at [facility], [address] [place], [place] [address]    Report Status PENDING  Incomplete  Blood Culture (routine x 2)     Status: None (Preliminary result)   Collection Time: 08/15/22 12:40 AM   Specimen: BLOOD LEFT HAND  Result Value Ref Range Status   Specimen Description BLOOD LEFT HAND  Final   Special Requests   Final    BOTTLES DRAWN AEROBIC AND ANAEROBIC Blood Culture adequate volume   Culture   Final    NO GROWTH 1 DAY Performed at [facility], [address], [place], [place] [address]    Report Status PENDING  Incomplete         Radiology Studies: CT Angio Chest Pulmonary Embolism (PE) W or WO Contrast  Result Date: 08/15/2022 CLINICAL DATA:  Shortness of breath,  immobility EXAM: CT ANGIOGRAPHY CHEST WITH CONTRAST TECHNIQUE: Multidetector CT imaging of the chest was performed using the standard protocol during bolus administration of intravenous contrast. Multiplanar CT image reconstructions and MIPs were obtained to evaluate the vascular anatomy. RADIATION DOSE REDUCTION: This exam was performed according to the departmental dose-optimization program which includes automated exposure control, adjustment of the mA and/or kV according to patient size and/or use of iterative reconstruction technique. CONTRAST:  57mL OMNIPAQUE IOHEXOL 350 MG/ML SOLN COMPARISON:  Chest radiograph 1 day prior, CTA chest 03/23/2022 FINDINGS: Cardiovascular: There is adequate opacification of the pulmonary arteries. There is no evidence of pulmonary embolism. The heart size is normal. There is a small pericardial effusion. The thoracic aorta is normal. Mediastinum/Nodes: There is significant leftward mediastinal shift due to volume loss in the left hemithorax. The thyroid is unremarkable. The esophagus is grossly unremarkable. There is no mediastinal, hilar, or axillary lymphadenopathy. Lungs/Pleura: The trachea is patent. There is mucoid debris in the left-sided airways with near-complete postobstructive collapse of the left lower lobe and partial postobstructive collapse of the left upper lobe, with significant associated volume loss and leftward mediastinal shift. The right airways are patent with mild central bronchial wall thickening. There is no focal airspace disease on the right. There is no pleural effusion or pneumothorax. There are no definite suspicious nodules, though note that the left lower lobe is not well evaluated Upper Abdomen: The imaged portions of the upper abdominal viscera are unremarkable. Musculoskeletal: There is no acute osseous abnormality or suspicious osseous lesion. Review of the MIP images confirms the above findings. IMPRESSION: 1. No evidence of pulmonary  embolism. 2. Mucoid debris in the left-sided airways with near-complete postobstructive collapse of the left lower lobe and partial postobstructive collapse of the left upper lobe, with significant associated volume loss and leftward mediastinal shift. 3. Small pericardial effusion. Electronically Signed   By: Lesia Hausen M.D.   On: 08/15/2022 09:58   DG Chest Port 1 View  Result Date:  08/14/2022 CLINICAL DATA:  Questionable sepsis EXAM: PORTABLE CHEST 1 VIEW COMPARISON:  Chest x-ray 08/04/2022. FINDINGS: Cardiac silhouette is enlarged, unchanged. Blunting of the left costophrenic angle and retrocardiac opacities persist. The right lung is clear. No pneumothorax or acute fracture. IMPRESSION: Unchanged cardiomegaly with small left pleural effusion and retrocardiac atelectasis versus consolidation. Electronically Signed   By: Darliss Cheney M.D.   On: 08/14/2022 23:44        Scheduled Meds:  amantadine  100 mg Oral BID   atorvastatin  80 mg Oral Daily   Chlorhexidine Gluconate Cloth  6 each Topical Daily   clopidogrel  75 mg Oral Daily   DULoxetine  60 mg Oral Daily   enoxaparin (LOVENOX) injection  40 mg Subcutaneous Daily   folic acid  1 mg Oral Daily   thiamine  100 mg Oral Daily   Continuous Infusions:  azithromycin Stopped (08/15/22 1309)   ceFEPime (MAXIPIME) IV 2 g (08/16/22 9735)          Glade Lloyd, MD [facility] 08/16/2022, 11:32 AM

## 2022-08-16 NOTE — Plan of Care (Signed)
  Problem: Education: Goal: Knowledge of General Education information will improve Description: Including pain rating scale, medication(s)/side effects and non-pharmacologic comfort measures Outcome: Not Progressing   Problem: Health Behavior/Discharge Planning: Goal: Ability to manage health-related needs will improve Outcome: Not Progressing   Problem: Clinical Measurements: Goal: Ability to maintain clinical measurements within normal limits will improve Outcome: Progressing Goal: Will remain free from infection Outcome: Progressing Goal: Diagnostic test results will improve Outcome: Progressing Goal: Respiratory complications will improve Outcome: Progressing Goal: Cardiovascular complication will be avoided Outcome: Progressing   Problem: Activity: Goal: Risk for activity intolerance will decrease Outcome: Not Progressing   Problem: Nutrition: Goal: Adequate nutrition will be maintained Outcome: Progressing   Problem: Coping: Goal: Level of anxiety will decrease Outcome: Progressing   Problem: Elimination: Goal: Will not experience complications related to bowel motility Outcome: Progressing Goal: Will not experience complications related to urinary retention Outcome: Progressing   Problem: Pain Managment: Goal: General experience of comfort will improve Outcome: Progressing   Problem: Safety: Goal: Ability to remain free from injury will improve Outcome: Progressing   Problem: Skin Integrity: Goal: Risk for impaired skin integrity will decrease Outcome: Progressing   Problem: Activity: Goal: Ability to tolerate increased activity will improve Outcome: Not Progressing   Problem: Clinical Measurements: Goal: Ability to maintain a body temperature in the normal range will improve Outcome: Progressing   Problem: Respiratory: Goal: Ability to maintain adequate ventilation will improve Outcome: Progressing Goal: Ability to maintain a clear airway will  improve Outcome: Progressing   

## 2022-08-16 NOTE — Progress Notes (Signed)
PHARMACY NOTE:  ANTIMICROBIAL RENAL DOSAGE ADJUSTMENT  Current antimicrobial regimen includes a mismatch between antimicrobial dosage and estimated renal function.  As per policy approved by the Pharmacy & Therapeutics and Medical Executive Committees, the antimicrobial dosage will be adjusted accordingly.  Current antimicrobial dosage:  cefepime 2g IV q8  Indication: PNA/UTI  Renal Function:  Estimated Creatinine Clearance: 47.5 mL/min (A) (by C-G formula based on SCr of 1.08 mg/dL (H)). []      On intermittent HD, scheduled: []      On CRRT    Antimicrobial dosage has been changed to:  Cefepime 1g IV q12   Additional comments: low weight   Onnie Boer, PharmD, BCIDP, AAHIVP, CPP Infectious Disease Pharmacist 08/16/2022 3:15 PM

## 2022-08-16 NOTE — Progress Notes (Signed)
Daily Progress Note   Patient Name: Cindy Brooks       Date: 08/16/2022 DOB: 1971/09/24  Age: 51 y.o. MRN#: [id number] Attending Physician: Alan Mulder, MD Primary Care Physician: Hoy Register, MD Admit Date: 08/14/2022  Reason for Consultation/Follow-up: Establishing goals of care  Subjective: Medical records reviewed including progress notes, labs, imaging. Patient assessed at the bedside.  Initially had her eyes open but closed them when I attempted to speak with her.  Slight movement of her left hand when asked if she could squeeze my hand.  No family present during my visit.  I then called patient's daughter [name] for ongoing goals of care conversation.  Provided her with updates on patient's condition today.  She has been learning about pneumonia since our conversation and has several follow-up questions.  We discussed potential sources, treatment options including chest physiotherapy, and concerning CTA chest findings of collapsed left lower lobe and left upper lobe.  I shared my concern that since patient is bedbound at baseline, her chest muscles may be too weak to cough up her mucus on her own and that this may be a recurring problem.  We discussed the risk of escalating care to ventilator support and she becomes very upset.  Provided emotional support and therapeutic listening as [name] share her previous experience at the time of her mother's stroke, when several providers told her she would be ventilator dependent for the rest of her life.  She feels that "[facility] is trying to kill my mom" by withholding information and laments the poor communication she has received during this hospitalization as well as back then.  I shared that I agree she needs to have access to up-to-date  information in order to make informed decisions on her mother's behalf.  We discussed whether she would like to share her grievances with the office of patient experience but she declines, stating they have not done anything to help her in the past.  Allowed her to continue venting and provided reassurance.  She feels confident that her mother will survive this acute illness despite this "road bump."  She expresses that she will never make the decision to "pull the plug" and that her mother would be very upset if she heard this being suggested.  She is appreciative of this PA advocating for an update  from the primary attending and ongoing palliative support.  Questions and concerns addressed. PMT will continue to support holistically.   Length of Stay: 1  Physical Exam Vitals and nursing note reviewed.  Constitutional:      General: She is not in acute distress.    Appearance: She is ill-appearing.  Cardiovascular:     Rate and Rhythm: Tachycardia present.  Pulmonary:     Effort: Pulmonary effort is normal.  Skin:    General: Skin is cool and dry.  Neurological:     Mental Status: She is unresponsive.           Vital Signs: BP (!) 146/103 (BP Location: Right Arm)   Pulse (!) 110   Temp 98.6 F (37 C) (Oral)   Resp 18   Ht 5\' 4"  (1.626 m)   Wt 48.8 kg   LMP  (LMP Unknown)   SpO2 99%   BMI 18.47 kg/m  SpO2: SpO2: 99 % O2 Device: O2 Device: Room Air O2 Flow Rate:        Palliative Assessment/Data: 10%   Palliative Care Assessment & Plan   Patient Profile: 51 y.o. female  with past medical history of recurrent UTI, CVA with deficits, depression, chronic pain admitted on 08/14/2022 with cough and altered mental status.    Patient is admitted for severe sepsis due to community-acquired pneumonia, UTI.  PMT has been consulted to assist with goals of care conversation.   Assessment: Goals of care conversation Recurrent UTI CVA with deficits Bacterial pneumonia Acute  metabolic encephalopathy Acute metabolic acidosis  Recommendations/Plan: Continue full code/full scope treatment Patient's daughter is very hopeful for improvement and is clear she would always pursue aggressive life-prolonging interventions, including long-term ventilator support Psychosocial and emotional support provided PMT will continue to follow and support   Prognosis: Poor  Discharge Planning: To Be Determined  Care plan was discussed with patient, patient's daughter, Dr. Starla Link   Total time: I spent 75 minutes in the care of the patient today in the above activities and documenting the encounter.  MDM high         Keanna Tugwell Johnnette Litter, PA-C  Palliative Medicine Team Team phone # [phone]  Thank you for allowing the Palliative Medicine Team to assist in the care of this patient. Please utilize secure chat with additional questions, if there is no response within 30 minutes please call the above phone number.  Palliative Medicine Team providers are available by phone from 7am to 7pm daily and can be reached through the team cell phone.  Should this patient require assistance outside of these hours, please call the patient's attending physician.

## 2022-08-16 NOTE — Evaluation (Signed)
Clinical/Bedside Swallow Evaluation Patient Details  Name: Cindy Brooks MRN: [id number] Date of Birth: [birth year]  Today's Date: 08/16/2022 Time: SLP Start Time (ACUTE ONLY): 8676 SLP Stop Time (ACUTE ONLY): 1950 SLP Time Calculation (min) (ACUTE ONLY): 16 min  Past Medical History:  Past Medical History:  Diagnosis Date   Hypertension    Hypertensive urgency 01/20/2022   Seizure ([place]) 01/28/2022   Stroke ([place])    TIA (transient ischemic attack) 2019   Urinary tract infection 01/28/2022   Past Surgical History:  Past Surgical History:  Procedure Laterality Date   ESOPHAGOGASTRODUODENOSCOPY N/A 01/28/2022   Procedure: ESOPHAGOGASTRODUODENOSCOPY (EGD);  Surgeon: Jesusita Oka, MD;  Location: [facility] ENDOSCOPY;  Service: General;  Laterality: N/A;   PEG PLACEMENT N/A 01/28/2022   Procedure: PERCUTANEOUS ENDOSCOPIC GASTROSTOMY (PEG) PLACEMENT;  Surgeon: Jesusita Oka, MD;  Location: [facility] ENDOSCOPY;  Service: General;  Laterality: N/A;   HPI:  Cindy Brooks is a 51 y.o. female with medical history significant of recurrent UTI who presents emergency department due to cough and altered mental status.  Patient is nonverbal at baseline and bedbound due to prior stroke.  Chest CT reported "Mucoid debris in the left-sided airways with near-complete postobstructive collapse of the left lower lobe and partial postobstructive collapse of the left upper lobe, with significant associated volume loss and leftward mediastinal shift."  Pt has a hx of dysphagia from previous CVA with most recent BSE on 03/25/22 with recommendations for Dysphagia 2 solids and thin liquids.    Assessment / Plan / Recommendation  Clinical Impression  Pt was seen for a bedside swallow evaluation.  Pt was asleep upon SLP arrival and only briefly roused to max verbal and tactile stimulation.  RN and NA reported that this had been the pt's baseline today.  Pt was unable to complete oral mechanism examination; however, her bilabial  surfaces were noted to be dry and required mild pressure to separate for oral care.  Lip moisturizer was applied. Pt passively tolerated oral care without difficulty and oral cavity did not appear to be dry.  She was unable to achieve labial opening to actively accept a spoon or straw, but she passively accepted a small trial of thin liquid into her oral cavity.  Left anterior labial spillage was observed with no attempt at lingual manipulation, AP transit or swallow initiation.  Recommend continuation of NPO with alternative means of nutrition.  SLP will f/u to determine readiness for diet initiation.  SLP Visit Diagnosis: Dysphagia, unspecified (R13.10)    Aspiration Risk  Moderate aspiration risk    Diet Recommendation NPO;Alternative means - temporary   Medication Administration: Via alternative means    Other  Recommendations Oral Care Recommendations: Oral care QID;Staff/trained caregiver to provide oral care    Recommendations for follow up therapy are one component of a multi-disciplinary discharge planning process, led by the attending physician.  Recommendations may be updated based on patient status, additional functional criteria and insurance authorization.  Follow up Recommendations Skilled nursing-short term rehab (<3 hours/day)      Assistance Recommended at Discharge Frequent or constant Supervision/Assistance  Functional Status Assessment Patient has had a recent decline in their functional status and demonstrates the ability to make significant improvements in function in a reasonable and predictable amount of time.  Frequency and Duration min 2x/week  2 weeks       Prognosis Prognosis for Safe Diet Advancement: Fair Barriers to Reach Goals: Cognitive deficits;Language deficits      Swallow Study  General HPI: Cindy Brooks is a 51 y.o. female with medical history significant of recurrent UTI who presents emergency department due to cough and altered mental status.   Patient is nonverbal at baseline and bedbound due to prior stroke.  Chest CT reported "Mucoid debris in the left-sided airways with near-complete postobstructive collapse of the left lower lobe and partial postobstructive collapse of the left upper lobe, with significant associated volume loss and leftward mediastinal shift."  Pt has a hx of dysphagia from previous CVA with most recent BSE on 03/25/22 with recommendations for Dysphagia 2 solids and thin liquids. Type of Study: Bedside Swallow Evaluation Previous Swallow Assessment: See HPI Diet Prior to this Study: NPO Temperature Spikes Noted: No Respiratory Status: Room air History of Recent Intubation: No Behavior/Cognition: Lethargic/Drowsy;Doesn't follow directions Oral Cavity Assessment: Dry Oral Care Completed by SLP: Yes Patient Positioning: Upright in bed Baseline Vocal Quality: Not observed Volitional Cough: Cognitively unable to elicit Volitional Swallow: Unable to elicit    Oral/Motor/Sensory Function Overall Oral Motor/Sensory Function: Other (comment) (Pt unable to complete)   Ice Chips Ice chips: Not tested   Thin Liquid Thin Liquid: Impaired Presentation: Straw Oral Phase Functional Implications: Left anterior spillage    Nectar Thick Nectar Thick Liquid: Not tested   Honey Thick Honey Thick Liquid: Not tested   Puree Puree: Not tested   Solid     Solid: Not tested     Eino Farber, M.S., CCC-SLP Acute Rehabilitation Services Office: [phone]  Shanon Rosser Linford Quintela 08/16/2022,2:08 PM

## 2022-08-16 NOTE — TOC Initial Note (Signed)
Transition of Care [facility]) - Initial/Assessment Note    Patient Details  Name: Cindy Brooks MRN: [id number] Date of Birth: Dec 23, 1970  [facility] CM/SW Contact:    Ninfa Meeker, RN Phone Number: 08/16/2022, 12:56 PM  Clinical Narrative:                 Case Manager received call from [facility] with [facility], patient is active with them. Caryl Pina stated that the family told therapist they are concerned that they can't provide the care patient needs. Per Therapist patient is not progressing well with Home Health and is a high level of care.         Patient Goals and CMS Choice        Expected Discharge Plan and Services                                                Prior Living Arrangements/Services                       Activities of Daily Living Home Assistive Devices/Equipment: Other (Comment) ADL Screening (condition at time of admission) Patient's cognitive ability adequate to safely complete daily activities?: No Is the patient deaf or have difficulty hearing?: No Does the patient have difficulty seeing, even when wearing glasses/contacts?: No Does the patient have difficulty concentrating, remembering, or making decisions?: Yes Patient able to express need for assistance with ADLs?: No Does the patient have difficulty dressing or bathing?: Yes Independently performs ADLs?: No Communication: Dependent Is this a change from baseline?: Pre-admission baseline Dressing (OT): Dependent Is this a change from baseline?: Pre-admission baseline Grooming: Dependent Is this a change from baseline?: Pre-admission baseline Feeding: Dependent Is this a change from baseline?: Pre-admission baseline Bathing: Dependent Is this a change from baseline?: Pre-admission baseline Toileting: Dependent Is this a change from baseline?: Pre-admission baseline In/Out Bed: Dependent Is this a change from baseline?: Pre-admission  baseline Walks in Home: Dependent Is this a change from baseline?: Pre-admission baseline Does the patient have difficulty walking or climbing stairs?: Yes Weakness of Legs: Both Weakness of Arms/Hands: Both  Permission Sought/Granted                  Emotional Assessment              Admission diagnosis:  Hypernatremia [E87.0] Community acquired bacterial pneumonia [J15.9] Sepsis ([place]) [A41.9] Urinary tract infection without hematuria, site unspecified [N39.0] Pneumonia due to infectious organism, unspecified laterality, unspecified part of lung [J18.9] Sepsis, due to unspecified organism, unspecified whether acute organ dysfunction present [facility]) [A41.9] Patient Active Problem List   Diagnosis Date Noted   Community acquired bacterial pneumonia 08/15/2022   Sepsis ([place]) 05/25/2022   ARF (acute renal failure) ([place]) 05/25/2022   Hemiparesis due to old stroke ([place]) 05/14/2022   Catheter-associated urinary tract infection ([place]) 04/20/2022   Sepsis secondary to UTI ([place]) 04/19/2022   Acute encephalopathy 03/24/2022   Tachycardia    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   Dysphagia as late effect of stroke 03/20/2022   Acute urinary retention 03/20/2022   Normocytic anemia 03/20/2022   CVA (cerebral vascular accident) ([place]) 03/20/2022   Hypernatremia 02/07/2022   Coma ([place]) 02/07/2022   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 [place]  [place] [address] Phone: [phone] Fax: [phone]     Social Determinants of Health (SDOH) Interventions    Readmission Risk Interventions    05/28/2022    2:27 PM 03/13/2022    9:03 AM  Readmission Risk Prevention Plan  Transportation Screening Complete Complete  PCP or Specialist Appt within 5-7 Days  Complete  PCP or Specialist Appt within 3-5 Days Complete   Home Care Screening  Complete  Medication Review (RN CM)  Complete  HRI or Home Care Consult Complete   Social Work Consult for Recovery Care Planning/Counseling Complete   Palliative Care Screening Not Applicable   Medication Review [facility]) Complete

## 2022-08-16 NOTE — Inpatient Diabetes Management (Signed)
Inpatient Diabetes Program Recommendations  AACE/ADA: New Consensus Statement on Inpatient Glycemic Control (2015)  Target Ranges:  Prepandial:   less than 140 mg/dL      Peak postprandial:   less than 180 mg/dL (1-2 hours)      Critically ill patients:  140 - 180 mg/dL   Lab Results  Component Value Date   GLUCAP 80 08/15/2022   HGBA1C 5.7 (H) 01/22/2022    Review of Glycemic Control  Latest Reference Range & Units 08/15/22 08:47  Glucose-Capillary 70 - 99 mg/dL 80    Latest Reference Range & Units 08/16/22 05:08  Glucose 70 - 99 mg/dL 63 (L)  (L): Data is abnormally low Diabetes history: PreDM Outpatient Diabetes medications: none Current orders for Inpatient glycemic control: none  Inpatient Diabetes Program Recommendations:    Noted mild low on glucose serum this AM. Consider adding CBGs TID & HS.   Thanks, Bronson Curb, MSN, RNC-OB Diabetes Coordinator [phone] (8a-5p)

## 2022-08-16 NOTE — Progress Notes (Signed)
Patient stable during shift and at the time of change of shift. Report given to oncoming shift RN. Complete bed bath, with linen change, hair washing, gown change, oral care provided during shift and CHG. Discussed with MD during shift that patient not engaged in PO intake, requested that PO meds be swtiched to IV if possible and informed that will hold PO meds until further SLP eval, MD agrees. Family at bedside during later half of shift and supportive. All needs met during shift. Bed in lowest position, call light within reach. Frequent rounding performed throughout shift to anticipate needs.

## 2022-08-17 ENCOUNTER — Inpatient Hospital Stay (HOSPITAL_COMMUNITY): Payer: No Typology Code available for payment source

## 2022-08-17 DIAGNOSIS — N390 Urinary tract infection, site not specified: Secondary | ICD-10-CM | POA: Diagnosis not present

## 2022-08-17 DIAGNOSIS — J159 Unspecified bacterial pneumonia: Secondary | ICD-10-CM | POA: Diagnosis not present

## 2022-08-17 DIAGNOSIS — Z515 Encounter for palliative care: Secondary | ICD-10-CM

## 2022-08-17 DIAGNOSIS — G9341 Metabolic encephalopathy: Secondary | ICD-10-CM

## 2022-08-17 DIAGNOSIS — E870 Hyperosmolality and hypernatremia: Secondary | ICD-10-CM | POA: Diagnosis not present

## 2022-08-17 DIAGNOSIS — G9340 Encephalopathy, unspecified: Secondary | ICD-10-CM | POA: Diagnosis not present

## 2022-08-17 DIAGNOSIS — Z789 Other specified health status: Secondary | ICD-10-CM

## 2022-08-17 DIAGNOSIS — J189 Pneumonia, unspecified organism: Secondary | ICD-10-CM

## 2022-08-17 DIAGNOSIS — R54 Age-related physical debility: Secondary | ICD-10-CM

## 2022-08-17 DIAGNOSIS — A419 Sepsis, unspecified organism: Secondary | ICD-10-CM | POA: Diagnosis not present

## 2022-08-17 DIAGNOSIS — R638 Other symptoms and signs concerning food and fluid intake: Secondary | ICD-10-CM

## 2022-08-17 DIAGNOSIS — Z711 Person with feared health complaint in whom no diagnosis is made: Secondary | ICD-10-CM

## 2022-08-17 LAB — CBC WITH DIFFERENTIAL/PLATELET
Abs Immature Granulocytes: 0.02 10*3/uL (ref 0.00–0.07)
Basophils Absolute: 0 10*3/uL (ref 0.0–0.1)
Basophils Relative: 1 %
Eosinophils Absolute: 0 10*3/uL (ref 0.0–0.5)
Eosinophils Relative: 1 %
HCT: 24.9 % — ABNORMAL LOW (ref 36.0–46.0)
Hemoglobin: 8.4 g/dL — ABNORMAL LOW (ref 12.0–15.0)
Immature Granulocytes: 0 %
Lymphocytes Relative: 25 %
Lymphs Abs: 1.4 10*3/uL (ref 0.7–4.0)
MCH: 27.5 pg (ref 26.0–34.0)
MCHC: 33.7 g/dL (ref 30.0–36.0)
MCV: 81.4 fL (ref 80.0–100.0)
Monocytes Absolute: 0.5 10*3/uL (ref 0.1–1.0)
Monocytes Relative: 8 %
Neutro Abs: 3.8 10*3/uL (ref 1.7–7.7)
Neutrophils Relative %: 65 %
Platelets: 198 10*3/uL (ref 150–400)
RBC: 3.06 MIL/uL — ABNORMAL LOW (ref 3.87–5.11)
RDW: 15.6 % — ABNORMAL HIGH (ref 11.5–15.5)
WBC: 5.8 10*3/uL (ref 4.0–10.5)
nRBC: 0 % (ref 0.0–0.2)

## 2022-08-17 LAB — BASIC METABOLIC PANEL
Anion gap: 13 (ref 5–15)
BUN: 6 mg/dL (ref 6–20)
CO2: 32 mmol/L (ref 22–32)
Calcium: 8.5 mg/dL — ABNORMAL LOW (ref 8.9–10.3)
Chloride: 98 mmol/L (ref 98–111)
Creatinine, Ser: 0.77 mg/dL (ref 0.44–1.00)
GFR, Estimated: >60 mL/min (ref >60)
Glucose, Bld: 105 mg/dL — ABNORMAL HIGH (ref 70–99)
Potassium: 2.6 mmol/L — CL (ref 3.5–5.1)
Sodium: 143 mmol/L (ref 135–145)

## 2022-08-17 LAB — GLUCOSE, CAPILLARY: Glucose-Capillary: 108 mg/dL — ABNORMAL HIGH (ref 70–99)

## 2022-08-17 LAB — COMPREHENSIVE METABOLIC PANEL
ALT: 7 U/L (ref 0–44)
AST: 14 U/L — ABNORMAL LOW (ref 15–41)
Albumin: 2.1 g/dL — ABNORMAL LOW (ref 3.5–5.0)
Alkaline Phosphatase: 58 U/L (ref 38–126)
Anion gap: 11 (ref 5–15)
BUN: <5 mg/dL — ABNORMAL LOW (ref 6–20)
CO2: 37 mmol/L — ABNORMAL HIGH (ref 22–32)
Calcium: 7.4 mg/dL — ABNORMAL LOW (ref 8.9–10.3)
Chloride: 92 mmol/L — ABNORMAL LOW (ref 98–111)
Creatinine, Ser: 0.75 mg/dL (ref 0.44–1.00)
GFR, Estimated: >60 mL/min (ref >60)
Glucose, Bld: 426 mg/dL — ABNORMAL HIGH (ref 70–99)
Potassium: 2.3 mmol/L — CL (ref 3.5–5.1)
Sodium: 140 mmol/L (ref 135–145)
Total Bilirubin: 0.6 mg/dL (ref 0.3–1.2)
Total Protein: 4.7 g/dL — ABNORMAL LOW (ref 6.5–8.1)

## 2022-08-17 LAB — MAGNESIUM
Magnesium: 1.4 mg/dL — ABNORMAL LOW (ref 1.7–2.4)
Magnesium: 1.6 mg/dL — ABNORMAL LOW (ref 1.7–2.4)

## 2022-08-17 LAB — VITAMIN B12: Vitamin B-12: 515 pg/mL (ref 180–914)

## 2022-08-17 LAB — AMMONIA: Ammonia: 18 umol/L (ref 9–35)

## 2022-08-17 LAB — TSH: TSH: 1.552 u[IU]/mL (ref 0.350–4.500)

## 2022-08-17 MED ORDER — POTASSIUM CHLORIDE IN NACL 40-0.9 MEQ/L-% IV SOLN
INTRAVENOUS | Status: DC
  Filled 2022-08-17 (×3): qty 1000

## 2022-08-17 MED ORDER — MAGNESIUM SULFATE 2 GM/50ML IV SOLN
2.0000 g | Freq: Once | INTRAVENOUS | Status: AC
  Administered 2022-08-17: 2 g via INTRAVENOUS
  Filled 2022-08-17: qty 50

## 2022-08-17 MED ORDER — POTASSIUM CHLORIDE 10 MEQ/100ML IV SOLN
10.0000 meq | INTRAVENOUS | Status: DC
  Administered 2022-08-17 (×5): 10 meq via INTRAVENOUS
  Filled 2022-08-17 (×4): qty 100

## 2022-08-17 NOTE — Progress Notes (Signed)
EEG complete - results pending 

## 2022-08-17 NOTE — Progress Notes (Signed)
Daily Progress Note   Patient Name: Cindy Brooks       Date: 08/17/2022 DOB: 1971-07-21  Age: 51 y.o. MRN#: [id number] Attending Physician: Aline August, MD Primary Care Physician: Charlott Rakes, MD Admit Date: 08/14/2022  Reason for Consultation/Follow-up: Establishing goals of care  Subjective: Chart review performed.  Received report from primary RN - no acute concerns.  RN reports patient remains minimally responsive and nonverbal.  Per RN, patient was more alert yesterday evening for a short time; however, return to nonresponsive/nonverbal status -this could have been patient's end-of-life rally.  Possibly a sign she is approaching end-of-life.  Went to visit patient at bedside -no family/visitors present.  EEG tech present placing on monitor.  He also states patient is nonresponsive to stimuli/verbal cues.  Called daughter/Cindy Brooks -emotional support provided.  She expresses gratitude for attending calling her yesterday and this morning to provide updates.  Provided updates per my and RN assessment today.  Answered her additional questions.  She wonders if placing NG tube will negatively affect patient's mental status -education reviewed that patient does not have to go under sedation for this and it should not have an effect on her mental state.  Current [facility] visitation policy reviewed per her request.  Discussed SLP assessment from today compared to yesterday -no significant changes.  Education provided that recommendation remains for n.p.o. status due to severe decreased alertness.  Therapeutic listening provided as daughter tells me how they attempted to feed patient yesterday but she continuously spit out the food.  Provided education this could be due to patient not being hungry,  which is a natural and expected sign/symptom at end-of-life.  Reviewed this in context of tube feeds - discussed starting artificial nutrition on someone approaching end-of-life could cause uncomfortable symptoms to include nausea, vomiting, bloating, aspiration due to slowing of the GI tract.  Daughter expressed understanding.  Daughter also questioned what caused patient's lung collapse - reviewed most likely due to previously weakened respiratory muscles in context of mucus debris in the airway.  Daughter also wish to review blood cultures -blood culture preliminary results reviewed.  Education provided on what blood cultures are, when they are drawn, how they are read, what they indicate per her request. [name] asks if patient will ever be able to come home -gently expressed concern that patient is  extremely frail and sick.  Reviewed aggressive medical interventions that have been provided and discussed that, unfortunately, we have not seen significant improvement in patient's overall status, which is concerning she may be approaching end-of-life. Cindy Brooks requests to add her sister to the phone call.  Patient's daughter/Cindy Brooks's sister/Cindy Brooks conference called.  Provided updates per blood cultures as outlined above per their request. Cindy Brooks wonders what caused the patient's AMS - discussed is likely multifactorial.  Reviewed that we have stopped medications that could have caused AMS but still do not see significant improvement, reviewed it could be end-of-life decline if no treatable cause can be found (CT head and labs thus far have been unremarkable), also reviewed that patient was ill and frail prior to hospitalization due to prior deficits and discussed that it could be a progression of her physical frailty, or progression after acute illness, also leading to end-of-life.  Natural trajectory at end-of-life reviewed.  Cindy Brooks request to "take a minute."  Allowed space and time for family to process  difficult information they just received.  Emotional support provided.  They have no further questions.  All questions and concerns addressed. Encouraged to call with questions and/or concerns. PMT number provided.  Length of Stay: 2  Current Medications: Scheduled Meds:   amantadine  100 mg Oral BID   atorvastatin  80 mg Oral Daily   Chlorhexidine Gluconate Cloth  6 each Topical Daily   clopidogrel  75 mg Oral Daily   enoxaparin (LOVENOX) injection  40 mg Subcutaneous Daily   thiamine (VITAMIN B1) injection  100 mg Intravenous Daily    Continuous Infusions:  azithromycin 500 mg (08/17/22 1253)   ceFEPime (MAXIPIME) IV 1 g (08/17/22 0921)   sodium bicarbonate 150 mEq in dextrose 5 % 1,150 mL infusion 100 mL/hr at 08/16/22 2316    PRN Meds: acetaminophen **OR** acetaminophen, ondansetron **OR** ondansetron (ZOFRAN) IV, oxyCODONE  Physical Exam Vitals and nursing note reviewed.  Constitutional:      General: She is not in acute distress.    Appearance: She is cachectic. She is ill-appearing.  Pulmonary:     Effort: No respiratory distress.  Skin:    General: Skin is warm and dry.  Neurological:     Mental Status: She is unresponsive.     Motor: Weakness present.  Psychiatric:        Speech: She is noncommunicative.             Vital Signs: BP (!) 136/103 (BP Location: Right Arm)   Pulse 84   Temp 98.3 F (36.8 C) (Axillary)   Resp 13   Ht 5\' 4"  (1.626 m)   Wt 48.8 kg   LMP  (LMP Unknown)   SpO2 100%   BMI 18.47 kg/m  SpO2: SpO2: 100 % O2 Device: O2 Device: Room Air O2 Flow Rate:    Intake/output summary:  Intake/Output Summary (Last 24 hours) at 08/17/2022 1353 Last data filed at 08/16/2022 1453 Gross per 24 hour  Intake --  Output 650 ml  Net -650 ml   LBM: Last BM Date : 08/16/22 Baseline Weight: Weight: 48.8 kg Most recent weight: Weight: 48.8 kg       Palliative Assessment/Data: PPS 10%      Patient Active Problem List   Diagnosis Date  Noted   Community acquired bacterial pneumonia 08/15/2022   Sepsis ([place]) 05/25/2022   ARF (acute renal failure) ([place]) 05/25/2022   Hemiparesis due to old stroke ([place]) 05/14/2022   Catheter-associated urinary tract infection ([place])  04/20/2022   Sepsis secondary to UTI ([place]) 04/19/2022   Acute encephalopathy 03/24/2022   Tachycardia    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   Dysphagia as late effect of stroke 03/20/2022   Acute urinary retention 03/20/2022   Normocytic anemia 03/20/2022   CVA (cerebral vascular accident) ([place]) 03/20/2022   Hypernatremia 02/07/2022   Coma ([place]) 02/07/2022   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 female  with past medical history of recurrent UTI, CVA with deficits, depression, chronic pain admitted on 08/14/2022 with cough and altered mental status.    Patient is admitted for severe sepsis due to community-acquired pneumonia, UTI.  PMT has been consulted to assist with goals of care conversation.  Assessment: Principal Problem:   Community acquired bacterial pneumonia Active Problems:   Sepsis ([place])  Concern about end-of-life  Recommendations/Plan: Continue full code/full scope treatment Daughter is appreciative of daily updates from attending Ongoing goals of care discussions - family request time to process information given to them today Patient is likely approaching end-of-life and would be comfort/hospice appropriate if/when family agreeable PMT will continue to follow and support holistically  Goals of Care and Additional Recommendations: Limitations on Scope of Treatment: Full Scope  Treatment  Code Status:    Code Status Orders  (From admission, onward)           Start     Ordered   08/15/22 0507  Full code  Continuous        08/15/22 0506           Code Status History     Date Active Date Inactive Code Status Order ID Comments User Context   05/25/2022 2306 05/29/2022 0217 Full Code [id number]  Rise Patience, MD ED   04/19/2022 1525 04/21/2022 2331 Full Code [id number]  Neena Rhymes, MD ED   03/24/2022 0133 03/26/2022 2224 Full Code [id number]  Rise Patience, MD ED   01/20/2022 1015 03/21/2022 1835 Full Code [id number]  Minor, Grace Bushy, NP ED   01/20/2022 0634 01/20/2022 1015 Full Code [id number]  Rise Patience, MD ED       Prognosis:  Poor  Discharge Planning: To Be Determined  Care plan was discussed with primary RN, patient's 2 daughters  Thank you for allowing the Palliative Medicine Team to assist in the care of this patient.   Total Time 60 minutes Prolonged Time Billed  no       Greater than 50%  of this time was spent counseling and coordinating care related to the above assessment and plan.  Lin Landsman, NP  Please contact Palliative Medicine Team phone at [phone] for questions and concerns.   *Portions of this note are a verbal dictation therefore any spelling and/or grammatical errors are due to the "[place] [employer]" system interpretation.

## 2022-08-17 NOTE — Progress Notes (Signed)
MD Mont Dutton) informed of patient's critical K+ result, acknowledge by MD. See orders for additional.

## 2022-08-17 NOTE — Procedures (Signed)
History: 51 year old female being evaluated for encephalopathy  Sedation: None  Technique: This EEG was acquired with electrodes placed according to the International 10-20 electrode system (including Fp1, Fp2, F3, F4, C3, C4, P3, P4, O1, O2, T3, T4, T5, T6, A1, A2, Fz, Cz, Pz). The following electrodes were missing or displaced: none.   Background: There is a posterior dominant rhythm of 7 to 8 Hz that is seen at times.  Muscle artifact obscures much of the recording, but when the waking background is visible in addition there is generalized irregular delta and theta activities seen in the waking background.  Photic stimulation: Physiologic driving is not performed  EEG Abnormalities: 1) generalized irregular slow activity 2) slow posterior dominant rhythm  Clinical Interpretation: This EEG is consistent with a generalized nonspecific cerebral dysfunction (encephalopathy). There was no seizure or seizure predisposition recorded on this study. Please note that lack of epileptiform activity on EEG does not preclude the possibility of epilepsy.   Roland Rack, MD [facility] [phone]  If 7pm- 7am, please page neurology on call as listed in [place].

## 2022-08-17 NOTE — Plan of Care (Signed)
  Problem: Education: Goal: Knowledge of General Education information will improve Description: Including pain rating scale, medication(s)/side effects and non-pharmacologic comfort measures Outcome: Not Progressing   Problem: Health Behavior/Discharge Planning: Goal: Ability to manage health-related needs will improve Outcome: Not Progressing   Problem: Clinical Measurements: Goal: Ability to maintain clinical measurements within normal limits will improve Outcome: Progressing Goal: Will remain free from infection Outcome: Progressing Goal: Diagnostic test results will improve Outcome: Progressing Goal: Respiratory complications will improve Outcome: Progressing Goal: Cardiovascular complication will be avoided Outcome: Progressing   Problem: Activity: Goal: Risk for activity intolerance will decrease Outcome: Not Progressing   Problem: Nutrition: Goal: Adequate nutrition will be maintained Outcome: Not Progressing   Problem: Coping: Goal: Level of anxiety will decrease Outcome: Progressing   Problem: Elimination: Goal: Will not experience complications related to bowel motility Outcome: Progressing Goal: Will not experience complications related to urinary retention Outcome: Progressing   Problem: Pain Managment: Goal: General experience of comfort will improve Outcome: Progressing   Problem: Safety: Goal: Ability to remain free from injury will improve Outcome: Progressing   Problem: Skin Integrity: Goal: Risk for impaired skin integrity will decrease Outcome: Progressing   Problem: Activity: Goal: Ability to tolerate increased activity will improve Outcome: Not Progressing   Problem: Clinical Measurements: Goal: Ability to maintain a body temperature in the normal range will improve Outcome: Progressing   Problem: Respiratory: Goal: Ability to maintain adequate ventilation will improve Outcome: Progressing Goal: Ability to maintain a clear airway  will improve Outcome: Progressing

## 2022-08-17 NOTE — Progress Notes (Addendum)
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  On presentation, she was tachycardic with lactic acid of 2.3, WBC of 7.1, sodium 149.  Chest x-ray showed unchanged cardiomegaly with small pleural effusion.  UA was concerning for UTI.  COVID-19 and influenza test were negative.  She was started on IV fluids and antibiotics.  Assessment & Plan:   Possible community-acquired bacterial pneumonia Mucoid debris in the left-sided airways with postobstructive collapse of left upper and lower lobes -Presented with cough.  CTA chest was negative for PE but showed mucoid debris in the left sided airways with left upper lobe and left lower lobe collapse.  Discussed with pulmonary via secure chat on 08/15/2022 who recommended aggressive chest physiotherapy.  If respiratory status worsens, they can be officially involved.  I have consulted respiratory therapy. -Initially started on broad-spectrum antibiotics.  DC'd vancomycin on 08/16/2022.  Continue cefepime and Zithromax. -COVID-19 and influenza testing negative on presentation.  Cultures negative so far.  Severe sepsis: Present on admission -Possibly from pneumonia and UTI -Hemodynamically improving.  Follow cultures  UTI, possibly associated with Foley catheter Chronic urinary retention with chronic indwelling Foley catheter -Foley catheter apparently changed within the last 2 weeks. -Continue antibiotics as above.  Follow cultures.  Outpatient follow-up with urology.  Acute metabolic encephalopathy Prior CVA, unspecified with prior residual deficits Goals of care -Patient presented with worsening mental status changes.  Almost nonverbal  at baseline and mostly bedbound due to prior stroke. -Mental status still not improving.  Still hardly responsive.   -Gabapentin on hold.  Cymbalta on hold -Palliative care following.  Overall prognosis is poor.  Patient remains full code. -SLP following: Recommends n.p.o. -Follow PT recommendations -CT of the head without contrast was negative for any acute intracranial abnormity on 08/16/2022.  EEG pending. -B12, ammonia and TSH levels normal. -will start NG feeding: Daughter agreeable  Hyponatremia -Resolved  Hypernatremia -Possibly from dehydration.  Labs pending for today.  Acute metabolic acidosis -Currently on bicarb drip.  Labs pending for today.  Anemia of chronic disease -From chronic illnesses.  Hemoglobin slightly drifting down, 8.4 today.  No signs of bleeding.  Monitor intermittently.  Stage II right buttock pressure ulcer: Present on admission -Continue local wound care    DVT prophylaxis: Lovenox Code Status: Full Family Communication: Spoke to daughter/Keyona on phone on 08/17/2022 Disposition Plan: Status is: Inpatient Remains inpatient appropriate because: Of severity of illness    Consultants: Palliative care  Procedures: None  Antimicrobials:  Anti-infectives (From admission, onward)    Start     Dose/Rate Route Frequency Ordered Stop   08/16/22 2300  vancomycin (VANCOREADY) IVPB 1250 mg/250 mL  Status:  Discontinued        1,250 mg 166.7 mL/hr over 90 Minutes Intravenous Every 24 hours 08/15/22 1157 08/15/22 1157   08/16/22 1800  ceFEPIme (MAXIPIME) 1 g in sodium chloride 0.9 % 100 mL IVPB        1 g 200 mL/hr over 30 Minutes Intravenous 2 times daily 08/16/22 1514     08/15/22 2300  vancomycin (VANCOREADY) IVPB 1250 mg/250 mL  Status:  Discontinued        1,250 mg 166.7 mL/hr over 90 Minutes Intravenous Every 24 hours 08/15/22 1157  08/16/22 1121   08/15/22 1100  azithromycin (ZITHROMAX) 500 mg in sodium chloride 0.9 % 250 mL IVPB        500  mg 250 mL/hr over 60 Minutes Intravenous Every 24 hours 08/15/22 1049     08/15/22 1000  vancomycin (VANCOREADY) IVPB 750 mg/150 mL  Status:  Discontinued        750 mg 150 mL/hr over 60 Minutes Intravenous Every 12 hours 08/15/22 0533 08/15/22 1157   08/15/22 0800  ceFEPIme (MAXIPIME) 2 g in sodium chloride 0.9 % 100 mL IVPB  Status:  Discontinued        2 g 200 mL/hr over 30 Minutes Intravenous Every 8 hours 08/15/22 0528 08/16/22 1514   08/14/22 2345  aztreonam (AZACTAM) 2 g in sodium chloride 0.9 % 100 mL IVPB  Status:  Discontinued        2 g 200 mL/hr over 30 Minutes Intravenous  Once 08/14/22 2331 08/14/22 2334   08/14/22 2345  metroNIDAZOLE (FLAGYL) IVPB 500 mg        500 mg 100 mL/hr over 60 Minutes Intravenous  Once 08/14/22 2331 08/15/22 0247   08/14/22 2345  vancomycin (VANCOCIN) IVPB 1000 mg/200 mL premix  Status:  Discontinued        1,000 mg 200 mL/hr over 60 Minutes Intravenous  Once 08/14/22 2331 08/14/22 2334   08/14/22 2345  ceFEPIme (MAXIPIME) 2 g in sodium chloride 0.9 % 100 mL IVPB        2 g 200 mL/hr over 30 Minutes Intravenous  Once 08/14/22 2334 08/15/22 0102   08/14/22 2345  vancomycin (VANCOREADY) IVPB 1250 mg/250 mL        1,250 mg 166.7 mL/hr over 90 Minutes Intravenous  Once 08/14/22 2334 08/15/22 0316        Subjective: Patient seen and examined at bedside.  No fever, chest pain, worsening shortness of breath reported.  Still very hard to wake up, does not follow any commands. Objective: Vitals:   08/16/22 1933 08/16/22 2000 08/17/22 0000 08/17/22 0400  BP:  (!) 133/95 131/87 (!) 140/94  Pulse: 99 99  99  Resp: 15 14 12 18   Temp:  98.6 F (37 C) 98.7 F (37.1 C) 98.6 F (37 C)  TempSrc:  Oral Oral Axillary  SpO2: 99% 99% 100% 100%  Weight:      Height:        Intake/Output Summary (Last 24 hours) at 08/17/2022 0823 Last data filed at 08/16/2022 1453 Gross per 24 hour  Intake --  Output 650 ml  Net -650 ml    Filed Weights   08/15/22  1824  Weight: 48.8 kg    Examination:  General: On room air.  No distress.  Looks chronically ill and deconditioned. ENT/neck: No thyromegaly.  JVD is not elevated  respiratory: Decreased breath sounds at bases bilaterally with some crackles; no wheezing  CVS: S1-S2 heard, rate controlled currently Abdominal: Soft, nontender, slightly distended; no organomegaly, bowel sounds are heard Extremities: Trace lower extremity edema; no cyanosis  CNS: Still very hard to wake up, does not follow any commands. Lymph: No obvious lymphadenopathy Skin: No obvious ecchymosis/lesions  psych: Could not be assessed because of mental status.  No signs of agitation. Musculoskeletal: No obvious joint swelling/deformity     Data Reviewed: I have personally reviewed following labs and imaging studies  CBC: Recent Labs  Lab 08/15/22 0035 08/15/22 0849 08/16/22 0641 08/17/22 0407  WBC 7.1 8.3 7.9 5.8  NEUTROABS 4.7  --   --  3.8  HGB 10.6* 8.4* 7.9* 8.4*  HCT 32.5* 25.5* 24.0* 24.9*  MCV 86.4 85.6 84.2 81.4  PLT 304 217 187 198    Basic Metabolic Panel: Recent Labs  Lab 08/15/22 0242 08/15/22 0849 08/16/22 0508  NA 149*  --  146*  K 3.5  --  4.5  CL 114*  --  114*  CO2 20*  --  16*  GLUCOSE 81  --  63*  BUN 12  --  10  CREATININE 1.13* 1.07* 1.08*  CALCIUM 9.5  --  9.3  MG  --  1.6* 2.2    GFR: Estimated Creatinine Clearance: 47.5 mL/min (A) (by C-G formula based on SCr of 1.08 mg/dL (H)). Liver Function Tests: Recent Labs  Lab 08/15/22 0242  AST 16  ALT 9  ALKPHOS 60  BILITOT 1.0  PROT 5.6*  ALBUMIN 2.9*    No results for input(s): "LIPASE", "AMYLASE" in the last 168 hours. Recent Labs  Lab 08/17/22 0407  AMMONIA 18   Coagulation Profile: Recent Labs  Lab 08/15/22 0035  INR 1.1    Cardiac Enzymes: No results for input(s): "CKTOTAL", "CKMB", "CKMBINDEX", "TROPONINI" in the last 168 hours. BNP (last 3 results) No results for input(s): "PROBNP" in the last  8760 hours. HbA1C: No results for input(s): "HGBA1C" in the last 72 hours. CBG: Recent Labs  Lab 08/15/22 0847  GLUCAP 80    Lipid Profile: No results for input(s): "CHOL", "HDL", "LDLCALC", "TRIG", "CHOLHDL", "LDLDIRECT" in the last 72 hours. Thyroid Function Tests: Recent Labs    08/17/22 0407  TSH 1.552   Anemia Panel: Recent Labs    08/17/22 0407  VITAMINB12 515   Sepsis Labs: Recent Labs  Lab 08/15/22 0005 08/15/22 0425  LATICACIDVEN 2.3* 1.4     Recent Results (from the past 240 hour(s))  Resp Panel by RT-PCR (Flu A&B, Covid) Anterior Nasal Swab     Status: None   Collection Time: 08/15/22 12:05 AM   Specimen: Anterior Nasal Swab  Result Value Ref Range Status   SARS Coronavirus 2 by RT PCR NEGATIVE NEGATIVE Final    Comment: (NOTE) SARS-CoV-2 target nucleic acids are NOT DETECTED.  The SARS-CoV-2 RNA is generally detectable in upper respiratory specimens during the acute phase of infection. The lowest concentration of SARS-CoV-2 viral copies this assay can detect is 138 copies/mL. A negative result does not preclude SARS-Cov-2 infection and should not be used as the sole basis for treatment or other patient management decisions. A negative result may occur with  improper specimen collection/handling, submission of specimen other than nasopharyngeal swab, presence of viral mutation(s) within the areas targeted by this assay, and inadequate number of viral copies(<138 copies/mL). A negative result must be combined with clinical observations, patient history, and epidemiological information. The expected result is Negative.  Fact Sheet for Patients:  BloggerCourse.com  Fact Sheet for Healthcare Providers:  SeriousBroker.it  This test is no t yet approved or cleared by the [place] FDA and  has been authorized for detection and/or diagnosis of SARS-CoV-2 by FDA under an Emergency Use Authorization  (EUA). This EUA will remain  in effect (meaning this test can be used) for the duration of the COVID-19 declaration under Section 564(b)(1) of the Act, 21 U.S.C.section 360bbb-3(b)(1), unless the authorization is terminated  or revoked sooner.       Influenza A by PCR NEGATIVE NEGATIVE Final   Influenza B by PCR NEGATIVE NEGATIVE Final    Comment: (NOTE) The Xpert Xpress SARS-CoV-2/FLU/RSV  plus assay is intended as an aid in the diagnosis of influenza from Nasopharyngeal swab specimens and should not be used as a sole basis for treatment. Nasal washings and aspirates are unacceptable for Xpert Xpress SARS-CoV-2/FLU/RSV testing.  Fact Sheet for Patients: EntrepreneurPulse.com.au  Fact Sheet for Healthcare Providers: IncredibleEmployment.be  This test is not yet approved or cleared by the [place] FDA and has been authorized for detection and/or diagnosis of SARS-CoV-2 by FDA under an Emergency Use Authorization (EUA). This EUA will remain in effect (meaning this test can be used) for the duration of the COVID-19 declaration under Section 564(b)(1) of the Act, 21 U.S.C. section 360bbb-3(b)(1), unless the authorization is terminated or revoked.  Performed at [facility], [place] [address], [place], [place] [address]   Blood Culture (routine x 2)     Status: None (Preliminary result)   Collection Time: 08/15/22 12:08 AM   Specimen: BLOOD RIGHT HAND  Result Value Ref Range Status   Specimen Description BLOOD RIGHT HAND  Final   Special Requests   Final    BOTTLES DRAWN AEROBIC AND ANAEROBIC Blood Culture adequate volume   Culture   Final    NO GROWTH 2 DAYS Performed at [facility], [place] [address] [place], [place] [address]    Report Status PENDING  Incomplete  Blood Culture (routine x 2)     Status: None (Preliminary result)   Collection Time: 08/15/22 12:40 AM   Specimen: BLOOD LEFT HAND  Result Value Ref Range Status    Specimen Description BLOOD LEFT HAND  Final   Special Requests   Final    BOTTLES DRAWN AEROBIC AND ANAEROBIC Blood Culture adequate volume   Culture   Final    NO GROWTH 2 DAYS Performed at [facility], [place] [address], [place], [place] [address]    Report Status PENDING  Incomplete         Radiology Studies: CT HEAD WO CONTRAST (5MM)  Result Date: 08/17/2022 CLINICAL DATA:  Initial evaluation for delirium. EXAM: CT HEAD WITHOUT CONTRAST TECHNIQUE: Contiguous axial images were obtained from the base of the skull through the vertex without intravenous contrast. RADIATION DOSE REDUCTION: This exam was performed according to the departmental dose-optimization program which includes automated exposure control, adjustment of the mA and/or kV according to patient size and/or use of iterative reconstruction technique. COMPARISON:  Prior CT from 08/04/2022. FINDINGS: Brain: Examination technically limited by positioning. Cerebral volume within normal limits. Scattered patchy hypodensity involving the supratentorial cerebral white matter, most consistent with chronic small vessel ischemic disease. Remote lacunar infarct noted at the left basal ganglia. No acute intracranial hemorrhage. No acute large vessel territory infarct. No mass lesion, mass effect or midline shift. No hydrocephalus or extra-axial fluid collection. Vascular: No hyperdense vessel. Skull: Scalp soft tissues and calvarium demonstrate no acute finding. Sinuses/Orbits: There is apparent curvilinear fluid signal intensity at the posterior/superior aspect of the right globe (series 6, image 17), of uncertain significance. Globes and orbital soft tissues otherwise unremarkable. Visualized paranasal sinuses are largely clear. No significant mastoid effusion. Other: None. IMPRESSION: 1. No acute intracranial abnormality. 2. Chronic small vessel ischemic disease with remote lacunar infarct at the left basal ganglia. 3. Curvilinear fluid  signal intensity at the posterior/superior aspect of the right globe as above, of uncertain significance. Correlation with physical exam recommended. Electronically Signed   By: Jeannine Boga M.D.   On: 08/17/2022 02:28   CT Angio Chest Pulmonary Embolism (PE) W  or WO Contrast  Result Date: 08/15/2022 CLINICAL DATA:  Shortness of breath, immobility EXAM: CT ANGIOGRAPHY CHEST WITH CONTRAST TECHNIQUE: Multidetector CT imaging of the chest was performed using the standard protocol during bolus administration of intravenous contrast. Multiplanar CT image reconstructions and MIPs were obtained to evaluate the vascular anatomy. RADIATION DOSE REDUCTION: This exam was performed according to the departmental dose-optimization program which includes automated exposure control, adjustment of the mA and/or kV according to patient size and/or use of iterative reconstruction technique. CONTRAST:  72mL OMNIPAQUE IOHEXOL 350 MG/ML SOLN COMPARISON:  Chest radiograph 1 day prior, CTA chest 03/23/2022 FINDINGS: Cardiovascular: There is adequate opacification of the pulmonary arteries. There is no evidence of pulmonary embolism. The heart size is normal. There is a small pericardial effusion. The thoracic aorta is normal. Mediastinum/Nodes: There is significant leftward mediastinal shift due to volume loss in the left hemithorax. The thyroid is unremarkable. The esophagus is grossly unremarkable. There is no mediastinal, hilar, or axillary lymphadenopathy. Lungs/Pleura: The trachea is patent. There is mucoid debris in the left-sided airways with near-complete postobstructive collapse of the left lower lobe and partial postobstructive collapse of the left upper lobe, with significant associated volume loss and leftward mediastinal shift. The right airways are patent with mild central bronchial wall thickening. There is no focal airspace disease on the right. There is no pleural effusion or pneumothorax. There are no definite  suspicious nodules, though note that the left lower lobe is not well evaluated Upper Abdomen: The imaged portions of the upper abdominal viscera are unremarkable. Musculoskeletal: There is no acute osseous abnormality or suspicious osseous lesion. Review of the MIP images confirms the above findings. IMPRESSION: 1. No evidence of pulmonary embolism. 2. Mucoid debris in the left-sided airways with near-complete postobstructive collapse of the left lower lobe and partial postobstructive collapse of the left upper lobe, with significant associated volume loss and leftward mediastinal shift. 3. Small pericardial effusion. Electronically Signed   By: Lesia Hausen M.D.   On: 08/15/2022 09:58        Scheduled Meds:  amantadine  100 mg Oral BID   atorvastatin  80 mg Oral Daily   Chlorhexidine Gluconate Cloth  6 each Topical Daily   clopidogrel  75 mg Oral Daily   enoxaparin (LOVENOX) injection  40 mg Subcutaneous Daily   thiamine (VITAMIN B1) injection  100 mg Intravenous Daily   Continuous Infusions:  azithromycin 500 mg (08/16/22 1230)   ceFEPime (MAXIPIME) IV 1 g (08/16/22 1821)   sodium bicarbonate 150 mEq in dextrose 5 % 1,150 mL infusion 100 mL/hr at 08/16/22 2316          Keevan Wolz, MD [facility] 08/17/2022, 8:23 AM

## 2022-08-17 NOTE — Progress Notes (Signed)
Speech Language Pathology Treatment: Dysphagia  Patient Details Name: Cindy Brooks MRN: [id number] DOB: 1971/05/18 Today's Date: 08/17/2022 Time: 7106-2694 SLP Time Calculation (min) (ACUTE ONLY): 20 min  Assessment / Plan / Recommendation Clinical Impression  Ms Mort remains obtunded with very little response this am.  RN assisted to reposition.  She accepted oral care with some volitional mouth opening. There were no spontaneous swallows in reaction to oral care. Face and right hand was washed with no response elicited.  No PO trials were given.  A regular diet tray was at the bedside.  Recommend NPO until her MS improves. Her baseline diet has been a dysphagia 2/thin liquids. SLP will follow along for PO readiness. D/W RN.   HPI HPI: Cindy Brooks is a 51 y.o. female with medical history significant of recurrent UTI who presents emergency department due to cough and altered mental status.  Patient is nonverbal at baseline and bedbound due to prior stroke.  She had a two-month hospitalization in April of 2023 due to bilateral thalamic, midbrain, and bilateral cerebral infarctions. She had trach/PEG at that time and dysphagia and cognitive/language therapies. Chest CT reported "Mucoid debris in the left-sided airways with near-complete postobstructive collapse of the left lower lobe and partial postobstructive collapse of the left upper lobe, with significant associated volume loss and leftward mediastinal shift."  Most recent BSE on 03/25/22 with recommendations for Dysphagia 2 solids and thin liquids.      SLP Plan  Continue with current plan of care      Recommendations for follow up therapy are one component of a multi-disciplinary discharge planning process, led by the attending physician.  Recommendations may be updated based on patient status, additional functional criteria and insurance authorization.    Recommendations  Diet recommendations: NPO Medication Administration: Via  alternative means                Oral Care Recommendations: Oral care QID Follow Up Recommendations: Other (comment) (yba) Assistance recommended at discharge: Frequent or constant Supervision/Assistance SLP Visit Diagnosis: Dysphagia, unspecified (R13.10) Plan: Continue with current plan of care         Cindy Brooks L. Tivis Ringer, MA CCC/SLP Clinical Specialist - Acute Care SLP Acute Rehabilitation Services Office number [phone]   Cindy Brooks  08/17/2022, 9:48 AM

## 2022-08-18 DIAGNOSIS — R4189 Other symptoms and signs involving cognitive functions and awareness: Secondary | ICD-10-CM

## 2022-08-18 DIAGNOSIS — J159 Unspecified bacterial pneumonia: Secondary | ICD-10-CM | POA: Diagnosis not present

## 2022-08-18 DIAGNOSIS — R6520 Severe sepsis without septic shock: Secondary | ICD-10-CM

## 2022-08-18 DIAGNOSIS — J189 Pneumonia, unspecified organism: Secondary | ICD-10-CM | POA: Diagnosis not present

## 2022-08-18 DIAGNOSIS — A419 Sepsis, unspecified organism: Secondary | ICD-10-CM | POA: Diagnosis not present

## 2022-08-18 DIAGNOSIS — E870 Hyperosmolality and hypernatremia: Secondary | ICD-10-CM | POA: Diagnosis not present

## 2022-08-18 LAB — CBC WITH DIFFERENTIAL/PLATELET
Abs Immature Granulocytes: 0.03 10*3/uL (ref 0.00–0.07)
Basophils Absolute: 0 10*3/uL (ref 0.0–0.1)
Basophils Relative: 1 %
Eosinophils Absolute: 0 10*3/uL (ref 0.0–0.5)
Eosinophils Relative: 0 %
HCT: 24.7 % — ABNORMAL LOW (ref 36.0–46.0)
Hemoglobin: 8.2 g/dL — ABNORMAL LOW (ref 12.0–15.0)
Immature Granulocytes: 1 %
Lymphocytes Relative: 23 %
Lymphs Abs: 1.4 10*3/uL (ref 0.7–4.0)
MCH: 27.6 pg (ref 26.0–34.0)
MCHC: 33.2 g/dL (ref 30.0–36.0)
MCV: 83.2 fL (ref 80.0–100.0)
Monocytes Absolute: 0.6 10*3/uL (ref 0.1–1.0)
Monocytes Relative: 9 %
Neutro Abs: 4.2 10*3/uL (ref 1.7–7.7)
Neutrophils Relative %: 66 %
Platelets: 191 10*3/uL (ref 150–400)
RBC: 2.97 MIL/uL — ABNORMAL LOW (ref 3.87–5.11)
RDW: 16.2 % — ABNORMAL HIGH (ref 11.5–15.5)
WBC: 6.3 10*3/uL (ref 4.0–10.5)
nRBC: 0 % (ref 0.0–0.2)

## 2022-08-18 LAB — BASIC METABOLIC PANEL
Anion gap: 13 (ref 5–15)
BUN: 5 mg/dL — ABNORMAL LOW (ref 6–20)
CO2: 26 mmol/L (ref 22–32)
Calcium: 8.4 mg/dL — ABNORMAL LOW (ref 8.9–10.3)
Chloride: 104 mmol/L (ref 98–111)
Creatinine, Ser: 0.74 mg/dL (ref 0.44–1.00)
GFR, Estimated: >60 mL/min (ref >60)
Glucose, Bld: 76 mg/dL (ref 70–99)
Potassium: 4 mmol/L (ref 3.5–5.1)
Sodium: 143 mmol/L (ref 135–145)

## 2022-08-18 LAB — MAGNESIUM: Magnesium: 2.3 mg/dL (ref 1.7–2.4)

## 2022-08-18 LAB — GLUCOSE, CAPILLARY
Glucose-Capillary: 100 mg/dL — ABNORMAL HIGH (ref 70–99)
Glucose-Capillary: 100 mg/dL — ABNORMAL HIGH (ref 70–99)
Glucose-Capillary: 62 mg/dL — ABNORMAL LOW (ref 70–99)

## 2022-08-18 MED ORDER — CLOPIDOGREL BISULFATE 75 MG PO TABS
75.0000 mg | ORAL_TABLET | Freq: Every day | ORAL | Status: DC
  Administered 2022-08-20: 75 mg
  Filled 2022-08-18 (×2): qty 1

## 2022-08-18 MED ORDER — ATORVASTATIN CALCIUM 80 MG PO TABS
80.0000 mg | ORAL_TABLET | Freq: Every day | ORAL | Status: DC
  Administered 2022-08-19 – 2022-09-02 (×13): 80 mg
  Filled 2022-08-18 (×13): qty 1

## 2022-08-18 MED ORDER — VITAL HIGH PROTEIN PO LIQD: 1000.0000 mL | ORAL | Status: DC

## 2022-08-18 MED ORDER — FREE WATER
100.0000 mL | Freq: Four times a day (QID) | Status: DC
  Administered 2022-08-18 – 2022-08-21 (×13): 100 mL

## 2022-08-18 MED ORDER — ONDANSETRON HCL 4 MG/2ML IJ SOLN: 4.0000 mg | Freq: Four times a day (QID) | INTRAMUSCULAR | Status: DC | PRN

## 2022-08-18 MED ORDER — OXYCODONE HCL 5 MG PO TABS: 5.0000 mg | ORAL_TABLET | ORAL | Status: DC | PRN

## 2022-08-18 MED ORDER — AMANTADINE HCL 50 MG/5ML PO SOLN
100.0000 mg | Freq: Two times a day (BID) | ORAL | Status: DC
  Administered 2022-08-18 – 2022-09-02 (×27): 100 mg
  Filled 2022-08-18 (×32): qty 10

## 2022-08-18 MED ORDER — DEXTROSE 5 % IV SOLN: INTRAVENOUS | Status: DC

## 2022-08-18 MED ORDER — ONDANSETRON HCL 4 MG PO TABS: 4.0000 mg | ORAL_TABLET | Freq: Four times a day (QID) | ORAL | Status: DC | PRN

## 2022-08-18 MED ORDER — OSMOLITE 1.2 CAL PO LIQD
1000.0000 mL | ORAL | Status: DC
  Administered 2022-08-18 – 2022-09-02 (×9): 1000 mL
  Filled 2022-08-18 (×22): qty 1000

## 2022-08-18 NOTE — Progress Notes (Signed)
Patient vitals stable during shift, no apparent distress. Complete bed bath, oral care, linen change, catheter care provided, patient tolerated well. NGT in place and secured via reinforcement. Bed in lowest position, call light within reach, safety measures in place. Frequent rounding on patient for anticipating needs, comfort. BG 62, MD Starla Link) informed, IVF orders changed. Will endorse to oncoming shift RN for oncoming follow up.

## 2022-08-18 NOTE — Progress Notes (Signed)
Report given to oncoming shift RN. Patient stable. Family at bedside

## 2022-08-18 NOTE — Progress Notes (Signed)
Daily Progress Note   Patient Name: Cindy Brooks       Date: 08/18/2022 DOB: 01/01/1971  Age: 51 y.o. MRN#: [id number] Attending Physician: Aline August, MD Primary Care Physician: Charlott Rakes, MD Admit Date: 08/14/2022  Reason for Consultation/Follow-up: Establishing goals of care  Subjective: Chart review performed. Received report from primary RN - no acute concerns. Reports no overall changes from yesterday - remains unresponsive. RN reports tube feeds will be initiated today.  Went to visit patient at bedside - no family/visitors present. Patient was lying in bed asleep - she does not respond to voice/gentle touch. No signs or non-verbal gestures of pain or discomfort noted. No respiratory distress, increased work of breathing, or secretions noted.   Called daughter Cindy Brooks - emotional support provided. Provided updates per my and RN assessment today and again expressed overall concern for patient's condition and likely approaching end of life. Reviewed CT results from 11/3 per her request - no acute intracranial abnormality. Also reviewed results of EEG per her request - no seizure or seizure predisposition recorded. Reviewed that tube feeds will be initiated today. Therapeutic listening provided as she reflects on their time with patient yesterday - stating she and her children were able to hold patient's hand. She reports patient is more awake in the evenings. Prior to hospitalization, she reports patient "was asleep all day and awake all night." Discussed that, per RN, patient is not noted to have wakeful moments at night. RN does report she is slightly more responsive in the afternoons around 4p.   No changes to goals - full code/full scope.  All questions and concerns addressed.  Encouraged to call with questions and/or concerns. PMT number previously provided.  Length of Stay: 3  Current Medications: Scheduled Meds:   amantadine  100 mg Per Tube BID   [START ON 08/19/2022] atorvastatin  80 mg Per Tube Daily   Chlorhexidine Gluconate Cloth  6 each Topical Daily   [START ON 08/19/2022] clopidogrel  75 mg Per Tube Daily   enoxaparin (LOVENOX) injection  40 mg Subcutaneous Daily   thiamine (VITAMIN B1) injection  100 mg Intravenous Daily    Continuous Infusions:  0.9 % NaCl with KCl 40 mEq / L 75 mL/hr at 08/18/22 1205   azithromycin 500 mg (08/18/22 1207)   ceFEPime (MAXIPIME) IV 1 g (  08/18/22 0844)    PRN Meds: acetaminophen **OR** acetaminophen, ondansetron **OR** ondansetron (ZOFRAN) IV  Physical Exam Vitals and nursing note reviewed.  Constitutional:      General: She is not in acute distress.    Appearance: She is cachectic. She is ill-appearing.  Pulmonary:     Effort: No respiratory distress.  Skin:    General: Skin is warm and dry.  Neurological:     Mental Status: She is unresponsive.     Motor: Weakness present.  Psychiatric:        Speech: She is noncommunicative.             Vital Signs: BP (!) 144/93 (BP Location: Right Arm)   Pulse 85   Temp 98.2 F (36.8 C) (Axillary)   Resp 15   Ht 5\' 4"  (1.626 m)   Wt 48.8 kg   LMP  (LMP Unknown)   SpO2 99%   BMI 18.47 kg/m  SpO2: SpO2: 99 % O2 Device: O2 Device: Room Air O2 Flow Rate:    Intake/output summary:  Intake/Output Summary (Last 24 hours) at 08/18/2022 1456 Last data filed at 08/18/2022 0739 Gross per 24 hour  Intake --  Output 900 ml  Net -900 ml   LBM: Last BM Date : 08/16/22 Baseline Weight: Weight: 48.8 kg Most recent weight: Weight: 48.8 kg       Palliative Assessment/Data: PPS 30% with tube feeds      Patient Active Problem List   Diagnosis Date Noted   Community acquired bacterial pneumonia 08/15/2022   Sepsis ([place]) 05/25/2022   ARF (acute renal  failure) ([place]) 05/25/2022   Hemiparesis due to old stroke ([place]) 05/14/2022   Catheter-associated urinary tract infection ([place]) 04/20/2022   Sepsis secondary to UTI ([place]) 04/19/2022   Acute encephalopathy 03/24/2022   Tachycardia    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   Dysphagia as late effect of stroke 03/20/2022   Acute urinary retention 03/20/2022   Normocytic anemia 03/20/2022   CVA (cerebral vascular accident) ([place]) 03/20/2022   Hypernatremia 02/07/2022   Coma ([place]) 02/07/2022   Antiphospholipid antibody with hypercoagulable state ([place]) 02/04/2022   Pressure injury of skin 02/01/2022   PEG  status ([place]) 01/29/2022   UTI (urinary tract infection) 01/28/2022   HTN (hypertension)    AKI (acute kidney injury) ([place])    Acute bilateral thalamic, midbrain and bilateral cerebral ischemic CVA 23/76/2831   Acute metabolic encephalopathy 51/76/1607    Palliative Care Assessment & Plan   Patient Profile: 51 y.o. female  with past medical history of recurrent UTI, CVA with deficits, depression, chronic pain admitted on 08/14/2022 with cough and altered mental status.    Patient is admitted for severe sepsis due to community-acquired pneumonia, UTI.  PMT has been consulted to assist with goals of care conversation.  Assessment: Principal Problem:   Community acquired bacterial pneumonia Active Problems:   Acute metabolic encephalopathy   UTI (urinary tract infection)   CVA (cerebral vascular accident) ([place])   Sepsis ([place])   Concern about end of life  Recommendations/Plan: Continue full code/full scope treatment Daughter is appreciative of daily updates from attending Patient is likely approaching end-of-life and would be comfort/hospice appropriate if/when family agreeable PMT will continue to follow and support holistically  Goals of Care and Additional Recommendations: Limitations on Scope of Treatment: Full Scope Treatment  Code Status:    Code Status Orders   (From admission, onward)  Start     Ordered   08/15/22 0507  Full code  Continuous        08/15/22 0506           Code Status History     Date Active Date Inactive Code Status Order ID Comments User Context   05/25/2022 2306 05/29/2022 0217 Full Code [id number]  Rise Patience, MD ED   04/19/2022 1525 04/21/2022 2331 Full Code [id number]  Neena Rhymes, MD ED   03/24/2022 0133 03/26/2022 2224 Full Code [id number]  Rise Patience, MD ED   01/20/2022 1015 03/21/2022 1835 Full Code [id number]  Minor, Grace Bushy, NP ED   01/20/2022 0634 01/20/2022 1015 Full Code [id number]  Rise Patience, MD ED       Prognosis:  Poor  Discharge Planning: To Be Determined  Care plan was discussed with primary RN, patient's daughter  Thank you for allowing the Palliative Medicine Team to assist in the care of this patient.   Total Time 40 minutes Prolonged Time Billed  no       Greater than 50%  of this time was spent counseling and coordinating care related to the above assessment and plan.  Lin Landsman, NP  Please contact Palliative Medicine Team phone at [phone] for questions and concerns.   *Portions of this note are a verbal dictation therefore any spelling and/or grammatical errors are due to the "[place] [employer]" system interpretation.

## 2022-08-18 NOTE — Progress Notes (Addendum)
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  On presentation, she was tachycardic with lactic acid of 2.3, WBC of 7.1, sodium 149.  Chest x-ray showed unchanged cardiomegaly with small pleural effusion.  UA was concerning for UTI.  COVID-19 and influenza test were negative.  She was started on IV fluids and antibiotics.  Palliative care was consulted.  Assessment & Plan:   Possible community-acquired bacterial pneumonia Mucoid debris in the left-sided airways with postobstructive collapse of left upper and lower lobes -Presented with cough.  CTA chest was negative for PE but showed mucoid debris in the left sided airways with left upper lobe and left lower lobe collapse.  Discussed with pulmonary via secure chat on 08/15/2022 who recommended aggressive chest physiotherapy.  If respiratory status worsens, they can be officially involved.  I have consulted respiratory therapy. -Initially started on broad-spectrum antibiotics.  DC'd vancomycin on 08/16/2022.  Continue cefepime and Zithromax. -COVID-19 and influenza testing negative on presentation.  Cultures negative so far.  Severe sepsis: Present on admission -Possibly from pneumonia and UTI -Hemodynamically improving.  Blood cultures negative so far.  UTI, possibly associated with Foley catheter Chronic urinary retention with chronic indwelling Foley catheter -Foley catheter apparently changed within the last 2 weeks. -Continue antibiotics as above.  Outpatient follow-up with urology.  Acute metabolic encephalopathy Prior CVA, unspecified with prior residual deficits Goals of care -Patient presented with worsening mental  status changes.  Almost nonverbal at baseline and mostly bedbound due to prior stroke. -Mental status still not improving.  Still hardly responsive.   -Gabapentin on hold.  Cymbalta on hold -Palliative care following.  Overall prognosis is poor.  Patient remains full code. -SLP following: Recommends n.p.o. -NGT placed on 08/17/2022: Start tube feeding as per dietary recommendations. -Follow PT recommendations -CT of the head without contrast was negative for any acute intracranial abnormity on 08/16/2022.  EEG negative for seizures. -B12, ammonia and TSH levels normal.  Hyponatremia -Resolved  Hypernatremia -Possibly from dehydration.  Resolved.  Acute metabolic acidosis -Treated with bicarb drip.  Resolved.  Off bicarb drip.  Hypokalemia -Improved  Hypomagnesemia -Improved  Anemia of chronic disease -From chronic illnesses.  Hemoglobin slightly drifting down, 8.2 today.  No signs of bleeding.  Monitor intermittently.  Stage II right buttock pressure ulcer: Present on admission -Continue local wound care    DVT prophylaxis: Lovenox Code Status: Full Family Communication: Spoke to daughter/[place] on phone on 08/17/2022 Disposition Plan: Status is: Inpatient Remains inpatient appropriate because: Of severity of illness    Consultants: Palliative care  Procedures: EEG  Antimicrobials:  Anti-infectives (From admission, onward)    Start     Dose/Rate Route Frequency Ordered Stop   08/16/22 2300  vancomycin (VANCOREADY) IVPB 1250 mg/250 mL  Status:  Discontinued        1,250 mg 166.7 mL/hr over 90 Minutes Intravenous Every 24 hours 08/15/22 1157 08/15/22 1157   08/16/22 1800  ceFEPIme (MAXIPIME) 1 g in sodium chloride 0.9 % 100 mL IVPB        1 g 200 mL/hr over 30 Minutes Intravenous 2 times daily 08/16/22 1514     08/15/22 2300  vancomycin (VANCOREADY) IVPB 1250 mg/250 mL  Status:  Discontinued  1,250 mg 166.7 mL/hr over 90 Minutes Intravenous Every 24 hours  08/15/22 1157 08/16/22 1121   08/15/22 1100  azithromycin (ZITHROMAX) 500 mg in sodium chloride 0.9 % 250 mL IVPB        500 mg 250 mL/hr over 60 Minutes Intravenous Every 24 hours 08/15/22 1049     08/15/22 1000  vancomycin (VANCOREADY) IVPB 750 mg/150 mL  Status:  Discontinued        750 mg 150 mL/hr over 60 Minutes Intravenous Every 12 hours 08/15/22 0533 08/15/22 1157   08/15/22 0800  ceFEPIme (MAXIPIME) 2 g in sodium chloride 0.9 % 100 mL IVPB  Status:  Discontinued        2 g 200 mL/hr over 30 Minutes Intravenous Every 8 hours 08/15/22 0528 08/16/22 1514   08/14/22 2345  aztreonam (AZACTAM) 2 g in sodium chloride 0.9 % 100 mL IVPB  Status:  Discontinued        2 g 200 mL/hr over 30 Minutes Intravenous  Once 08/14/22 2331 08/14/22 2334   08/14/22 2345  metroNIDAZOLE (FLAGYL) IVPB 500 mg        500 mg 100 mL/hr over 60 Minutes Intravenous  Once 08/14/22 2331 08/15/22 0247   08/14/22 2345  vancomycin (VANCOCIN) IVPB 1000 mg/200 mL premix  Status:  Discontinued        1,000 mg 200 mL/hr over 60 Minutes Intravenous  Once 08/14/22 2331 08/14/22 2334   08/14/22 2345  ceFEPIme (MAXIPIME) 2 g in sodium chloride 0.9 % 100 mL IVPB        2 g 200 mL/hr over 30 Minutes Intravenous  Once 08/14/22 2334 08/15/22 0102   08/14/22 2345  vancomycin (VANCOREADY) IVPB 1250 mg/250 mL        1,250 mg 166.7 mL/hr over 90 Minutes Intravenous  Once 08/14/22 2334 08/15/22 0316        Subjective: Patient seen and examined at bedside.  No seizures, vomiting, agitation reported.  Hardly wakes up. Objective: Vitals:   08/17/22 2200 08/18/22 0000 08/18/22 0400 08/18/22 0734  BP: (!) 130/97 (!) 134/117 (!) 122/91 (!) 122/91  Pulse: 95 (!) 105 (!) 104 88  Resp: 19 18 14 16   Temp: 98.2 F (36.8 C) 98.1 F (36.7 C) 98.1 F (36.7 C) 98.6 F (37 C)  TempSrc: Axillary Axillary Axillary Axillary  SpO2: 98% 99% 98% 98%  Weight:      Height:        Intake/Output Summary (Last 24 hours) at 08/18/2022  0842 Last data filed at 08/18/2022 0739 Gross per 24 hour  Intake --  Output 1900 ml  Net -1900 ml    Filed Weights   08/15/22 1824  Weight: 48.8 kg    Examination:  General: No acute distress.  Still on room air.  Looks chronically ill and deconditioned. ENT/neck: No palpable neck masses or JVD elevation noted respiratory: Bilateral decreased breath sounds at bases with scattered crackles CVS: Mild intermittent tachycardia present; S1-S2 heard Abdominal: Soft, nontender, still has some distention: No organomegaly, normal bowel sounds heard  extremities: No clubbing; mild lower extremity edema present CNS: Hardly wakes up or follows any commands.   Lymph: No palpable cervical lymphadenopathy noted Skin: No obvious petechiae/rashes psych: Cannot assess because of mental status.  Currently not agitated.  Musculoskeletal: No obvious joint erythema/tenderness    Data Reviewed: I have personally reviewed following labs and imaging studies  CBC: Recent Labs  Lab 08/15/22 0035 08/15/22 0849 08/16/22 0641 08/17/22 0407 08/18/22 0336  WBC  7.1 8.3 7.9 5.8 6.3  NEUTROABS 4.7  --   --  3.8 4.2  HGB 10.6* 8.4* 7.9* 8.4* 8.2*  HCT 32.5* 25.5* 24.0* 24.9* 24.7*  MCV 86.4 85.6 84.2 81.4 83.2  PLT 304 217 187 198 433    Basic Metabolic Panel: Recent Labs  Lab 08/15/22 0242 08/15/22 0849 08/16/22 0508 08/17/22 1029 08/17/22 1519 08/18/22 0336  NA 149*  --  146* 140 143 143  K 3.5  --  4.5 2.3* 2.6* 4.0  CL 114*  --  114* 92* 98 104  CO2 20*  --  16* 37* 32 26  GLUCOSE 81  --  63* 426* 105* 76  BUN 12  --  10 <5* 6 5*  CREATININE 1.13* 1.07* 1.08* 0.75 0.77 0.74  CALCIUM 9.5  --  9.3 7.4* 8.5* 8.4*  MG  --  1.6* 2.2 1.4* 1.6* 2.3    GFR: Estimated Creatinine Clearance: 64.1 mL/min (by C-G formula based on SCr of 0.74 mg/dL). Liver Function Tests: Recent Labs  Lab 08/15/22 0242 08/17/22 1029  AST 16 14*  ALT 9 7  ALKPHOS 60 58  BILITOT 1.0 0.6  PROT 5.6* 4.7*   ALBUMIN 2.9* 2.1*    No results for input(s): "LIPASE", "AMYLASE" in the last 168 hours. Recent Labs  Lab 08/17/22 0407  AMMONIA 18    Coagulation Profile: Recent Labs  Lab 08/15/22 0035  INR 1.1    Cardiac Enzymes: No results for input(s): "CKTOTAL", "CKMB", "CKMBINDEX", "TROPONINI" in the last 168 hours. BNP (last 3 results) No results for input(s): "PROBNP" in the last 8760 hours. HbA1C: No results for input(s): "HGBA1C" in the last 72 hours. CBG: Recent Labs  Lab 08/15/22 0847 08/17/22 1048  GLUCAP 80 108*    Lipid Profile: No results for input(s): "CHOL", "HDL", "LDLCALC", "TRIG", "CHOLHDL", "LDLDIRECT" in the last 72 hours. Thyroid Function Tests: Recent Labs    08/17/22 0407  TSH 1.552    Anemia Panel: Recent Labs    08/17/22 0407  VITAMINB12 515    Sepsis Labs: Recent Labs  Lab 08/15/22 0005 08/15/22 0425  LATICACIDVEN 2.3* 1.4     Recent Results (from the past 240 hour(s))  Resp Panel by RT-PCR (Flu A&B, Covid) Anterior Nasal Swab     Status: None   Collection Time: 08/15/22 12:05 AM   Specimen: Anterior Nasal Swab  Result Value Ref Range Status   SARS Coronavirus 2 by RT PCR NEGATIVE NEGATIVE Final    Comment: (NOTE) SARS-CoV-2 target nucleic acids are NOT DETECTED.  The SARS-CoV-2 RNA is generally detectable in upper respiratory specimens during the acute phase of infection. The lowest concentration of SARS-CoV-2 viral copies this assay can detect is 138 copies/mL. A negative result does not preclude SARS-Cov-2 infection and should not be used as the sole basis for treatment or other patient management decisions. A negative result may occur with  improper specimen collection/handling, submission of specimen other than nasopharyngeal swab, presence of viral mutation(s) within the areas targeted by this assay, and inadequate number of viral copies(<138 copies/mL). A negative result must be combined with clinical observations,  patient history, and epidemiological information. The expected result is Negative.  Fact Sheet for Patients:  EntrepreneurPulse.com.au  Fact Sheet for Healthcare Providers:  IncredibleEmployment.be  This test is no t yet approved or cleared by the [place] FDA and  has been authorized for detection and/or diagnosis of SARS-CoV-2 by FDA under an Emergency Use Authorization (EUA). This EUA will remain  in effect (meaning this test can be used) for the duration of the COVID-19 declaration under Section 564(b)(1) of the Act, 21 U.S.C.section 360bbb-3(b)(1), unless the authorization is terminated  or revoked sooner.       Influenza A by PCR NEGATIVE NEGATIVE Final   Influenza B by PCR NEGATIVE NEGATIVE Final    Comment: (NOTE) The Xpert Xpress SARS-CoV-2/FLU/RSV plus assay is intended as an aid in the diagnosis of influenza from Nasopharyngeal swab specimens and should not be used as a sole basis for treatment. Nasal washings and aspirates are unacceptable for Xpert Xpress SARS-CoV-2/FLU/RSV testing.  Fact Sheet for Patients: BloggerCourse.com  Fact Sheet for Healthcare Providers: SeriousBroker.it  This test is not yet approved or cleared by the [place] FDA and has been authorized for detection and/or diagnosis of SARS-CoV-2 by FDA under an Emergency Use Authorization (EUA). This EUA will remain in effect (meaning this test can be used) for the duration of the COVID-19 declaration under Section 564(b)(1) of the Act, 21 U.S.C. section 360bbb-3(b)(1), unless the authorization is terminated or revoked.  Performed at [facility], [address] [place], [place] [address]   Blood Culture (routine x 2)     Status: None (Preliminary result)   Collection Time: 08/15/22 12:08 AM   Specimen: BLOOD RIGHT HAND  Result Value Ref Range Status   Specimen Description BLOOD RIGHT HAND   Final   Special Requests   Final    BOTTLES DRAWN AEROBIC AND ANAEROBIC Blood Culture adequate volume   Culture   Final    NO GROWTH 3 DAYS Performed at [facility], [address] [place], [place] [address]    Report Status PENDING  Incomplete  Blood Culture (routine x 2)     Status: None (Preliminary result)   Collection Time: 08/15/22 12:40 AM   Specimen: BLOOD LEFT HAND  Result Value Ref Range Status   Specimen Description BLOOD LEFT HAND  Final   Special Requests   Final    BOTTLES DRAWN AEROBIC AND ANAEROBIC Blood Culture adequate volume   Culture   Final    NO GROWTH 3 DAYS Performed at [facility], [address] [place], [place] [address]    Report Status PENDING  Incomplete         Radiology Studies: EEG adult  Result Date: 08/23/22 Rejeana Brock, MD     08-23-2022  7:26 PM History: 51 year old female being evaluated for encephalopathy Sedation: None Technique: This EEG was acquired with electrodes placed according to the International 10-20 electrode system (including Fp1, Fp2, F3, F4, C3, C4, P3, P4, O1, O2, T3, T4, T5, T6, A1, A2, Fz, Cz, Pz). The following electrodes were missing or displaced: none. Background: There is a posterior dominant rhythm of 7 to 8 Hz that is seen at times.  Muscle artifact obscures much of the recording, but when the waking background is visible in addition there is generalized irregular delta and theta activities seen in the waking background. Photic stimulation: Physiologic driving is not performed EEG Abnormalities: 1) generalized irregular slow activity 2) slow posterior dominant rhythm Clinical Interpretation: This EEG is consistent with a generalized nonspecific cerebral dysfunction (encephalopathy). There was no seizure or seizure predisposition recorded on this study. Please note that lack of epileptiform activity on EEG does not preclude the possibility of epilepsy. Ritta Slot, MD [facility]  [phone] If 7pm- 7am, please page neurology on call as listed in AMION.   DG Abd 1 View  Result Date: 08/17/2022 CLINICAL DATA:  NG tube placement. EXAM: ABDOMEN - 1 VIEW COMPARISON:  05/12/2022 FINDINGS: An enteric tube is noted with tip overlying the mid stomach. The bowel gas pattern is unremarkable. IMPRESSION: Enteric tube with tip overlying the mid stomach. Electronically Signed   By: Harmon Pier M.D.   On: 08/17/2022 16:57   CT HEAD WO CONTRAST ( )  Result Date: 08/17/2022 CLINICAL DATA:  Initial evaluation for delirium. EXAM: CT HEAD WITHOUT CONTRAST TECHNIQUE: Contiguous axial images were obtained from the base of the skull through the vertex without intravenous contrast. RADIATION DOSE REDUCTION: This exam was performed according to the departmental dose-optimization program which includes automated exposure control, adjustment of the mA and/or kV according to patient size and/or use of iterative reconstruction technique. COMPARISON:  Prior CT from 08/04/2022. FINDINGS: Brain: Examination technically limited by positioning. Cerebral volume within normal limits. Scattered patchy hypodensity involving the supratentorial cerebral white matter, most consistent with chronic small vessel ischemic disease. Remote lacunar infarct noted at the left basal ganglia. No acute intracranial hemorrhage. No acute large vessel territory infarct. No mass lesion, mass effect or midline shift. No hydrocephalus or extra-axial fluid collection. Vascular: No hyperdense vessel. Skull: Scalp soft tissues and calvarium demonstrate no acute finding. Sinuses/Orbits: There is apparent curvilinear fluid signal intensity at the posterior/superior aspect of the right globe (series 6, image 17), of uncertain significance. Globes and orbital soft tissues otherwise unremarkable. Visualized paranasal sinuses are largely clear. No significant mastoid effusion. Other: None. IMPRESSION: 1. No acute intracranial abnormality. 2.  Chronic small vessel ischemic disease with remote lacunar infarct at the left basal ganglia. 3. Curvilinear fluid signal intensity at the posterior/superior aspect of the right globe as above, of uncertain significance. Correlation with physical exam recommended. Electronically Signed   By: Rise Mu M.D.   On: 08/17/2022 02:28        Scheduled Meds:  amantadine  100 mg Oral BID   atorvastatin  80 mg Oral Daily   Chlorhexidine Gluconate Cloth  6 each Topical Daily   clopidogrel  75 mg Oral Daily   enoxaparin (LOVENOX) injection  40 mg Subcutaneous Daily   thiamine (VITAMIN B1) injection  100 mg Intravenous Daily   Continuous Infusions:  0.9 % NaCl with KCl 40 mEq / L 100 mL/hr at 08/18/22 0224   azithromycin 500 mg (08/17/22 1253)   ceFEPime (MAXIPIME) IV 1 g (08/17/22 2219)          Glade Lloyd, MD [facility] 08/18/2022, 8:42 AM

## 2022-08-18 NOTE — Plan of Care (Signed)
  Problem: Education: Goal: Knowledge of General Education information will improve Description: Including pain rating scale, medication(s)/side effects and non-pharmacologic comfort measures Outcome: Not Progressing   Problem: Health Behavior/Discharge Planning: Goal: Ability to manage health-related needs will improve Outcome: Not Progressing   Problem: Clinical Measurements: Goal: Ability to maintain clinical measurements within normal limits will improve Outcome: Progressing Goal: Will remain free from infection Outcome: Progressing Goal: Diagnostic test results will improve Outcome: Progressing Goal: Respiratory complications will improve Outcome: Progressing Goal: Cardiovascular complication will be avoided Outcome: Progressing   Problem: Activity: Goal: Risk for activity intolerance will decrease Outcome: Not Progressing   Problem: Nutrition: Goal: Adequate nutrition will be maintained Outcome: Progressing   Problem: Coping: Goal: Level of anxiety will decrease Outcome: Progressing   Problem: Elimination: Goal: Will not experience complications related to bowel motility Outcome: Progressing Goal: Will not experience complications related to urinary retention Outcome: Progressing   Problem: Pain Managment: Goal: General experience of comfort will improve Outcome: Progressing   Problem: Safety: Goal: Ability to remain free from injury will improve Outcome: Progressing   Problem: Skin Integrity: Goal: Risk for impaired skin integrity will decrease Outcome: Progressing   Problem: Activity: Goal: Ability to tolerate increased activity will improve Outcome: Not Progressing   Problem: Clinical Measurements: Goal: Ability to maintain a body temperature in the normal range will improve Outcome: Progressing   Problem: Respiratory: Goal: Ability to maintain adequate ventilation will improve Outcome: Progressing Goal: Ability to maintain a clear airway will  improve Outcome: Progressing

## 2022-08-18 NOTE — Progress Notes (Addendum)
Initial Nutrition Assessment RD working remotely.   DOCUMENTATION CODES:   Underweight  INTERVENTION:  - will order Osmolite 1.2 @ 20 ml/hr to advance by 10 ml every 12 hours to reach goal rate of 60 ml/hr with 100 ml free water QID.   - at goal rate, this regimen will provide 1728 kcal, 80 grams protein, and 1581 ml free water.  - monitor magnesium, potassium, and phosphorus BID for at least 3 days, MD to replete as needed, as pt is at risk for refeeding syndrome given degree of weight loss, unknown nutrition intake PTA and since admission.   - weigh patient today.  - complete NFPE when feasible.    NUTRITION DIAGNOSIS:   Increased nutrient needs related to acute illness, wound healing as evidenced by estimated needs.  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 Patient presented to the ED due to cough and worsening mental status. CXR showed unchanged cardiomegaly with small pleural effusion. UA was concerning for UTI. She was started on IV fluids and antibiotics. Palliative Care was consulted.  Diet advanced from NPO to Regular on 11/2 at 0507 and then changed back to NPO yesterday at 0951. No meal completion percentages documented in the flow sheet from during the time patient was on a Regular diet.  She is noted to be almost non-verbal at baseline.   Patient previously had a PEG; placed 01/28/22. Unable to determine when this was removed though from [facility] avatar, it appears that it was >90 days ago.  Patient with NGT placed in L nare on 11/4. Abdominal x-ray from 11/4 indicates tip of tube is in the  mid-stomach.  Weight on 11/2 was 107 lb and weight on 05/26/22 was 136 lb. This indicates 29 lb weight loss (21% body weight) in the past ~3 months; significant for time frame.  Per notes: - severe sepsis on admission--improving - possible CAP - UTI - acute metabolic encephalopathy - acute metabolic acidosis--resolved - anemia of chronic disease   Labs reviewed; BUN: 5 mg/dl, Ca: 8.4 mg/dl. Medications reviewed; 2 g IV Mg sulfate x1 run 11/4, 100 mg IV thiamine/day. IVF; NS-40 mEq KCl @ 75 ml/hr.    NUTRITION - FOCUSED PHYSICAL EXAM:  RD working remotely.  Diet Order:   Diet Order             Diet NPO time specified  Diet effective now                   EDUCATION NEEDS:   No education needs have been identified at this time  Skin:  Skin Assessment: Skin Integrity Issues: Skin Integrity Issues:: Stage II Stage II: R buttocks  Last BM:  PTA/unknown  Height:   Ht Readings from Last 1 Encounters:  08/15/22 5\' 4"  (1.626 m)    Weight:   Wt Readings from Last 1 Encounters:  08/15/22 48.8 kg    BMI:  Body mass index is 18.47 kg/m.  Estimated Nutritional Needs:  Kcal:  1650-1850 kcal Protein:  80-95 grams Fluid:  >/= 1.8 L/day     13/02/23, MS, RD, LDN, CNSC Clinical Dietitian PRN/Relief staff On-call/weekend pager #  available in [facility]

## 2022-08-19 ENCOUNTER — Inpatient Hospital Stay (HOSPITAL_COMMUNITY): Payer: No Typology Code available for payment source

## 2022-08-19 DIAGNOSIS — J690 Pneumonitis due to inhalation of food and vomit: Secondary | ICD-10-CM

## 2022-08-19 DIAGNOSIS — G9341 Metabolic encephalopathy: Secondary | ICD-10-CM | POA: Diagnosis not present

## 2022-08-19 DIAGNOSIS — J159 Unspecified bacterial pneumonia: Secondary | ICD-10-CM | POA: Diagnosis not present

## 2022-08-19 DIAGNOSIS — N3000 Acute cystitis without hematuria: Secondary | ICD-10-CM | POA: Diagnosis not present

## 2022-08-19 DIAGNOSIS — I63513 Cerebral infarction due to unspecified occlusion or stenosis of bilateral middle cerebral arteries: Secondary | ICD-10-CM

## 2022-08-19 DIAGNOSIS — A419 Sepsis, unspecified organism: Secondary | ICD-10-CM | POA: Diagnosis not present

## 2022-08-19 LAB — CBC WITH DIFFERENTIAL/PLATELET
Abs Immature Granulocytes: 0.05 10*3/uL (ref 0.00–0.07)
Basophils Absolute: 0 10*3/uL (ref 0.0–0.1)
Basophils Relative: 0 %
Eosinophils Absolute: 0 10*3/uL (ref 0.0–0.5)
Eosinophils Relative: 1 %
HCT: 21.4 % — ABNORMAL LOW (ref 36.0–46.0)
Hemoglobin: 7.1 g/dL — ABNORMAL LOW (ref 12.0–15.0)
Immature Granulocytes: 1 %
Lymphocytes Relative: 21 %
Lymphs Abs: 1.3 10*3/uL (ref 0.7–4.0)
MCH: 27.8 pg (ref 26.0–34.0)
MCHC: 33.2 g/dL (ref 30.0–36.0)
MCV: 83.9 fL (ref 80.0–100.0)
Monocytes Absolute: 0.7 10*3/uL (ref 0.1–1.0)
Monocytes Relative: 12 %
Neutro Abs: 3.9 10*3/uL (ref 1.7–7.7)
Neutrophils Relative %: 65 %
Platelets: UNDETERMINED 10*3/uL (ref 150–400)
RBC: 2.55 MIL/uL — ABNORMAL LOW (ref 3.87–5.11)
RDW: 16.1 % — ABNORMAL HIGH (ref 11.5–15.5)
WBC: 6 10*3/uL (ref 4.0–10.5)
nRBC: 0 % (ref 0.0–0.2)

## 2022-08-19 LAB — PHOSPHORUS: Phosphorus: 2 mg/dL — ABNORMAL LOW (ref 2.5–4.6)

## 2022-08-19 LAB — BASIC METABOLIC PANEL
Anion gap: 8 (ref 5–15)
BUN: <5 mg/dL — ABNORMAL LOW (ref 6–20)
CO2: 23 mmol/L (ref 22–32)
Calcium: 8.4 mg/dL — ABNORMAL LOW (ref 8.9–10.3)
Chloride: 110 mmol/L (ref 98–111)
Creatinine, Ser: 0.79 mg/dL (ref 0.44–1.00)
GFR, Estimated: >60 mL/min (ref >60)
Glucose, Bld: 109 mg/dL — ABNORMAL HIGH (ref 70–99)
Potassium: 4.3 mmol/L (ref 3.5–5.1)
Sodium: 141 mmol/L (ref 135–145)

## 2022-08-19 LAB — TSH: TSH: 2.174 u[IU]/mL (ref 0.350–4.500)

## 2022-08-19 LAB — GLUCOSE, CAPILLARY
Glucose-Capillary: 103 mg/dL — ABNORMAL HIGH (ref 70–99)
Glucose-Capillary: 116 mg/dL — ABNORMAL HIGH (ref 70–99)
Glucose-Capillary: 116 mg/dL — ABNORMAL HIGH (ref 70–99)
Glucose-Capillary: 122 mg/dL — ABNORMAL HIGH (ref 70–99)
Glucose-Capillary: 123 mg/dL — ABNORMAL HIGH (ref 70–99)
Glucose-Capillary: 127 mg/dL — ABNORMAL HIGH (ref 70–99)

## 2022-08-19 LAB — PROCALCITONIN: Procalcitonin: <0.1 ng/mL

## 2022-08-19 LAB — MAGNESIUM: Magnesium: 2 mg/dL (ref 1.7–2.4)

## 2022-08-19 LAB — VITAMIN B12: Vitamin B-12: 828 pg/mL (ref 180–914)

## 2022-08-19 LAB — FOLATE: Folate: 10.1 ng/mL (ref >5.9)

## 2022-08-19 MED ORDER — ADULT MULTIVITAMIN LIQUID CH
15.0000 mL | Freq: Every day | ORAL | Status: DC
  Administered 2022-08-19 – 2022-08-30 (×10): 15 mL
  Filled 2022-08-19 (×12): qty 15

## 2022-08-19 MED ORDER — GUAIFENESIN 100 MG/5ML PO LIQD
10.0000 mL | Freq: Two times a day (BID) | ORAL | Status: DC
  Administered 2022-08-19 – 2022-09-02 (×26): 10 mL
  Filled 2022-08-19 (×26): qty 10

## 2022-08-19 MED ORDER — SODIUM CHLORIDE 3 % IN NEBU
4.0000 mL | INHALATION_SOLUTION | Freq: Two times a day (BID) | RESPIRATORY_TRACT | Status: AC
  Administered 2022-08-19 – 2022-08-22 (×6): 4 mL via RESPIRATORY_TRACT
  Filled 2022-08-19 (×6): qty 4

## 2022-08-19 MED ORDER — SODIUM CHLORIDE 0.9 % IV SOLN
1.0000 g | Freq: Three times a day (TID) | INTRAVENOUS | Status: AC
  Administered 2022-08-19 – 2022-08-20 (×5): 1 g via INTRAVENOUS
  Filled 2022-08-19 (×3): qty 10
  Filled 2022-08-19: qty 1
  Filled 2022-08-19: qty 10

## 2022-08-19 MED ORDER — FOLIC ACID 1 MG PO TABS
1.0000 mg | ORAL_TABLET | Freq: Every day | ORAL | Status: DC
  Administered 2022-08-19 – 2022-09-02 (×13): 1 mg
  Filled 2022-08-19 (×14): qty 1

## 2022-08-19 MED ORDER — POTASSIUM PHOSPHATES 15 MMOLE/5ML IV SOLN
30.0000 mmol | Freq: Once | INTRAVENOUS | Status: AC
  Administered 2022-08-19: 30 mmol via INTRAVENOUS
  Filled 2022-08-19: qty 10

## 2022-08-19 NOTE — Consult Note (Addendum)
NAME:  Cindy Brooks, MRN:  [id number], DOB:  [birth year], LOS: 4 ADMISSION DATE:  08/14/2022, CONSULTATION DATE:  11/6 REFERRING MD:  Dr. Hanley Ben, CHIEF COMPLAINT:  Upper airway noise, cough    History of Present Illness:  51 y/o F who presented to [facility] ER on 11/1 with reports of cough & altered mental status.    At baseline, she is non-verbal and bed bound in the setting of prior CVA. She had a two month hospitalization in April 2023 due to CVA - bilateral thalamic, midbrain and bilateral cerebral infarctions. This resulted in trach / PEG.  She lives at home with her daughter [name].  Her daughter reports she does speak some.  Her grand kids sit with her & watch TV.  She is able to pat hands for yes/no, some very limited verbal interaction but not really any words. She has been working with PT but they stopped coming, OT is still coming to the house.  Working on trying to get her to set up.  Stays in bed most of the day. Son in law will pick her up and put her in the recliner during the day. Daughter [name] works full time, cares for her mother and her 4 children.  Reports she does eat at home. She reports she noted increased gurgling / cough and that prompted admission.  Initial work up notable for negative COVID & influenza screening. Blood cultures negative. UA with 20 ketones, 30 protein, rare bacteria, 11-20 WBC. CXR notable small left pleural effusion and retrocardiac atelectasis vs consolidation. CTA chest negative for PE, mucoid debris noted in the left sided airways with near complete post-obstructive collapse of the LLL and partial post obstructive collapse of the LUL with significant volume loss and leftward mediastinal shift. CT head negative for acute process.  The patient was admitted per [facility] with concern for PNA and treated with empiric abx.  She has not had fever or leukocytosis. Not requiring oxygen.   PCCM consulted 11/6 for evaluation of LLL findings.    Pertinent  Medical History   Prior ETOH  Respiratory Failure s/p Trach  Recurrent UTI  Significant Hospital Events: Including procedures, antibiotic start and stop dates in addition to other pertinent events   11/1 Admit with "gurgling", cough, admitted for PNA 11/6 PCCM consulted for pulmonary evaluation   Interim History / Subjective:  Afebrile  Not on oxygen  VSS  Objective   Blood pressure 123/84, pulse 88, temperature 98.8 F (37.1 C), temperature source Axillary, resp. rate 19, height 5\' 4"  (1.626 m), weight 48.8 kg, SpO2 100 %.        Intake/Output Summary (Last 24 hours) at 08/19/2022 1318 Last data filed at 08/19/2022 0739 Gross per 24 hour  Intake --  Output 1600 ml  Net -1600 ml   Filed Weights   08/15/22 1824  Weight: 48.8 kg    Examination: General: chronically ill appearing adult female lying in bed in NAD HENT: MM pink/moist, gastric tube in place, anicteric, left sided dependent facial edema Lungs: non-labored at rest, normal effort, on RA, diminished on left, clear on rigth  Cardiovascular: S1S2 RRR, no MRG, SR on monitor  Abdomen: non-distended, bsx4 active Extremities: warm/dry, trace edema  Neuro: Eyes open, withdrawals to pain, does not follow commands    Resolved Hospital Problem list      Assessment & Plan:   LLL, LUL Mucus Plugging with Atelectasis  Hx CVA  Hx CVA in April 2023, largely bed bound since April.  She lays predominately to the left in bed (as evidence by dependent left sided facial edema).  Suspect with eating at home, that she may have had aspiration event(s). She does not have elevated WBC, fever to suggest evolving PNA despite changes in LLL.  Not requiring O2.   -empiric abx per [facility], PCN allergy noted  -add aggressive pulmonary hygiene -chest PT Q4  -guaifenesin per tube BID  -add hypertonic saline nebs x3 days  -follow up CXR in am  -position RIGHT lung down to facilitate pulmonary drainage  -SLP evaluation > rec's for NPO, NGT. Most recent BSE in  03/2022 rec's for D2 solids + thin liquids -assess PCT   Hypophosphatemia  -KPHOS IV  -MVI PT  Acute Encephalopathy  Rule out other causes beyond pulmonary, no other significant metabolic derangements  -MRI brain pending  -per [facility]   Best Practice (right click and "Reselect all SmartList Selections" daily)  Per [facility]   Labs   CBC: Recent Labs  Lab 08/15/22 0035 08/15/22 0849 08/16/22 0641 08/17/22 0407 08/18/22 0336 08/19/22 0503  WBC 7.1 8.3 7.9 5.8 6.3 6.0  NEUTROABS 4.7  --   --  3.8 4.2 3.9  HGB 10.6* 8.4* 7.9* 8.4* 8.2* 7.1*  HCT 32.5* 25.5* 24.0* 24.9* 24.7* 21.4*  MCV 86.4 85.6 84.2 81.4 83.2 83.9  PLT 304 217 187 198 191 PLATELET CLUMPS NOTED ON SMEAR, UNABLE TO ESTIMATE    Basic Metabolic Panel: Recent Labs  Lab 08/16/22 0508 08/17/22 1029 08/17/22 1519 08/18/22 0336 08/19/22 0341  NA 146* 140 143 143 141  K 4.5 2.3* 2.6* 4.0 4.3  CL 114* 92* 98 104 110  CO2 16* 37* 32 26 23  GLUCOSE 63* 426* 105* 76 109*  BUN 10 <5* 6 5* <5*  CREATININE 1.08* 0.75 0.77 0.74 0.79  CALCIUM 9.3 7.4* 8.5* 8.4* 8.4*  MG 2.2 1.4* 1.6* 2.3 2.0  PHOS  --   --   --   --  2.0*   GFR: Estimated Creatinine Clearance: 64.1 mL/min (by C-G formula based on SCr of 0.79 mg/dL). Recent Labs  Lab 08/15/22 0005 08/15/22 0035 08/15/22 0425 08/15/22 0849 08/16/22 0641 08/17/22 0407 08/18/22 0336 08/19/22 0503  WBC  --    < >  --    < > 7.9 5.8 6.3 6.0  LATICACIDVEN 2.3*  --  1.4  --   --   --   --   --    < > = values in this interval not displayed.    Liver Function Tests: Recent Labs  Lab 08/15/22 0242 08/17/22 1029  AST 16 14*  ALT 9 7  ALKPHOS 60 58  BILITOT 1.0 0.6  PROT 5.6* 4.7*  ALBUMIN 2.9* 2.1*   No results for input(s): "LIPASE", "AMYLASE" in the last 168 hours. Recent Labs  Lab 08/17/22 0407  AMMONIA 18    ABG    Component Value Date/Time   PHART 7.47 (H) 02/26/2022 1725   PCO2ART 41 02/26/2022 1725   PO2ART 73 (L) 02/26/2022 1725   HCO3 29.8  (H) 02/26/2022 1725   TCO2 25 08/04/2022 1703   ACIDBASEDEF 2.0 01/21/2022 0354   O2SAT 95.5 02/26/2022 1725     Coagulation Profile: Recent Labs  Lab 08/15/22 0035  INR 1.1    Cardiac Enzymes: No results for input(s): "CKTOTAL", "CKMB", "CKMBINDEX", "TROPONINI" in the last 168 hours.  HbA1C: Hgb A1c MFr Bld  Date/Time Value Ref Range Status  01/22/2022 03:15 AM 5.7 (H) 4.8 - 5.6 %  Final    Comment:    (NOTE) Pre diabetes:          5.7%-6.4%  Diabetes:              >6.4%  Glycemic control for   <7.0% adults with diabetes     CBG: Recent Labs  Lab 08/18/22 2026 08/18/22 2358 08/19/22 0314 08/19/22 0741 08/19/22 1142  GLUCAP 100* 100* 116* 127* 116*    Review of Systems:   Unable to complete with patient. Information obtained from family via phone and staff at bedside.    Past Medical History:  She,  has a past medical history of Hypertension, Hypertensive urgency (01/20/2022), Seizure ([place]) (01/28/2022), Stroke [facility]), TIA (transient ischemic attack) (2019), and Urinary tract infection (01/28/2022).   Surgical History:   Past Surgical History:  Procedure Laterality Date   ESOPHAGOGASTRODUODENOSCOPY N/A 01/28/2022   Procedure: ESOPHAGOGASTRODUODENOSCOPY (EGD);  Surgeon: Jesusita Oka, MD;  [facility] ENDOSCOPY;  Service: General;  Laterality: N/A;   PEG PLACEMENT N/A 01/28/2022   Procedure: PERCUTANEOUS ENDOSCOPIC GASTROSTOMY (PEG) PLACEMENT;  Surgeon: Jesusita Oka, MD;  Location: [facility] ENDOSCOPY;  Service: General;  Laterality: N/A;     Social History:   reports that she has never smoked. She has never used smokeless tobacco. She reports that she does not currently use alcohol after a past usage of about 203.0 standard drinks of alcohol per week.   Family History:  Her family history includes Diabetes Mellitus II in her maternal grandmother.   Allergies Allergies  Allergen Reactions   Aspirin Hives and Rash   Codeine Hives and Rash   Penicillins  Anaphylaxis, Swelling and Other (See Comments)   Strawberry Extract Anaphylaxis and Hives     Home Medications  Prior to Admission medications   Medication Sig Start Date End Date Taking? Authorizing Provider  acetaminophen (TYLENOL) 325 MG tablet Take 2 tablets (650 mg total) by mouth every 4 (four) hours as needed. Patient taking differently: Take 650 mg by mouth every 4 (four) hours as needed for mild pain. 03/21/22 03/21/23 Yes Mercy Riding, MD  atorvastatin (LIPITOR) 80 MG tablet Take 1 tablet (80 mg total) by mouth daily. 05/14/22  Yes Charlott Rakes, MD  chlorhexidine (PERIDEX) 0.12 % solution Use as directed 15 mLs in the mouth or throat 2 (two) times daily. 05/28/22  Yes Georgette Shell, MD  DULoxetine (CYMBALTA) 60 MG capsule Take 1 capsule (60 mg total) by mouth daily. 05/14/22  Yes Newlin, Charlane Ferretti, MD  Ensure Plus (ENSURE PLUS) LIQD Take 237 mLs by mouth 3 (three) times daily between meals.   Yes [provider]  folic acid (FOLVITE) 1 MG tablet Take 1 tablet (1 mg total) by mouth daily. 03/21/22  Yes Mercy Riding, MD  gabapentin (NEURONTIN) 100 MG capsule Take 1 capsule (100 mg total) by mouth 2 (two) times daily. 05/14/22  Yes Charlott Rakes, MD  hydrALAZINE (APRESOLINE) 50 MG tablet Take 50 mg by mouth every 8 (eight) hours. 07/06/22  Yes [provider]  ipratropium-albuterol (DUONEB) 0.5-2.5 (3) MG/3ML SOLN Take 3 mLs by nebulization every 6 (six) hours as needed. Patient taking differently: Take 3 mLs by nebulization every 6 (six) hours as needed (SOB). 03/21/22  Yes Mercy Riding, MD  Multiple Vitamin (MULTIVITAMIN WITH MINERALS) TABS tablet Take 1 tablet by mouth daily. 03/21/22  Yes Mercy Riding, MD  Nystatin (GERHARDT'S BUTT CREAM) CREA Apply 1 application. topically daily as needed for irritation. 03/21/22  Yes Wendee Beavers  T, MD  nystatin (MYCOSTATIN) 100000 UNIT/ML suspension Take 5 mLs (500,000 Units total) by mouth 4 (four) times daily. 05/28/22  Yes Georgette Shell, MD  propranolol (INDERAL) 20 MG tablet Take 20 mg by mouth 3 (three) times daily.   Yes [provider]  senna-docusate (SENOKOT-S) 8.6-50 MG tablet Take 1 tablet by mouth 2 (two) times daily between meals as needed for mild constipation. 03/21/22  Yes Mercy Riding, MD  thiamine 100 MG tablet Take 1 tablet (100 mg total) by mouth daily. 03/21/22  Yes Mercy Riding, MD  amantadine (SYMMETREL) 100 MG capsule Take 1 capsule (100 mg total) by mouth 2 (two) times daily. Patient not taking: Reported on 08/15/2022 03/21/22 08/15/22  Mercy Riding, MD  clopidogrel (PLAVIX) 75 MG tablet Take 1 tablet (75 mg total) by mouth daily. 06/24/22   Charlott Rakes, MD  Misc. Devices MISC Urinalysis, urine culture and sensitivity 07/29/22   Charlott Rakes, MD  polyethylene glycol powder (MIRALAX) 17 GM/SCOOP powder Take 17 g by mouth 2 (two) times daily as needed for moderate constipation. Patient not taking: Reported on 06/14/2022 03/21/22   Mercy Riding, MD  propranolol (INDERAL) 10 MG tablet Take 1 tablet (10 mg total) by mouth 3 (three) times daily. Patient not taking: Reported on 08/15/2022 06/24/22   Charlott Rakes, MD  sulfamethoxazole-trimethoprim (BACTRIM DS) 800-160 MG tablet Take 1 tablet by mouth 2 (two) times daily. Patient not taking: Reported on 08/15/2022 08/05/22   [provider]     Critical care time: n/a     Noe Gens, MSN, APRN, NP-C, AGACNP-BC [facility] 08/19/2022, 2:28 PM   Please see [email or web address] for pager details.   From 7A-7P if no response, please call [phone] After hours, please call ELink [phone]

## 2022-08-19 NOTE — Consult Note (Signed)
NEURO HOSPITALIST CONSULT NOTE   Requestig physician: Dr. Starla Link   Reason for Consult:Hx of stroke   History obtained from: Chart   HPI:                                                                                                                                          Cindy Brooks is an 51 y.o. 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 Unable to obtain verbal history from patient given her neurological deficits at baseline.    Past Medical History:  Diagnosis Date   Hypertension    Hypertensive urgency 01/20/2022   Seizure ([place]) 01/28/2022   Stroke [facility])    TIA (transient ischemic attack) 2019   Urinary tract infection 01/28/2022    Past Surgical History:  Procedure Laterality Date   ESOPHAGOGASTRODUODENOSCOPY N/A 01/28/2022   Procedure: ESOPHAGOGASTRODUODENOSCOPY (EGD);  Surgeon: Jesusita Oka, MD;  Location: [facility] ENDOSCOPY;  Service: General;  Laterality: N/A;   PEG PLACEMENT N/A 01/28/2022   Procedure: PERCUTANEOUS ENDOSCOPIC GASTROSTOMY (PEG) PLACEMENT;  Surgeon: Jesusita Oka, MD;  Location: [facility] ENDOSCOPY;  Service: General;  Laterality: N/A;    Family History  Problem Relation Age of Onset   Diabetes Mellitus II Maternal Grandmother    Social History:  reports that she has never smoked. She has never used smokeless tobacco. She reports that she does not currently use alcohol after a past usage of about 203.0 standard drinks of alcohol per week. No history on file for drug use.  Allergies  Allergen Reactions   Aspirin Hives and Rash   Codeine Hives and Rash   Penicillins Anaphylaxis, Swelling and Other (See Comments)   Strawberry Extract  Anaphylaxis and Hives    MEDICATIONS:                                                                                                                     Prior to Admission:  Medications Prior to Admission  Medication Sig Dispense Refill Last Dose   acetaminophen (TYLENOL) 325 MG tablet Take 2 tablets (650  mg total) by mouth every 4 (four) hours as needed. (Patient taking differently: Take 650 mg by mouth every 4 (four) hours as needed for mild pain.) 100 tablet 2 Past Week   atorvastatin (LIPITOR) 80 MG tablet Take 1 tablet (80 mg total) by mouth daily. 30 tablet 3 Past Week   chlorhexidine (PERIDEX) 0.12 % solution Use as directed 15 mLs in the mouth or throat 2 (two) times daily. 120 mL 2 Past Week   DULoxetine (CYMBALTA) 60 MG capsule Take 1 capsule (60 mg total) by mouth daily. 30 capsule 3 Past Week   Ensure Plus (ENSURE PLUS) LIQD Take 237 mLs by mouth 3 (three) times daily between meals.   Past Week   folic acid (FOLVITE) 1 MG tablet Take 1 tablet (1 mg total) by mouth daily. 30 tablet 1 Past Week   gabapentin (NEURONTIN) 100 MG capsule Take 1 capsule (100 mg total) by mouth 2 (two) times daily. 60 capsule 3 Past Week   hydrALAZINE (APRESOLINE) 50 MG tablet Take 50 mg by mouth every 8 (eight) hours.   Past Week   ipratropium-albuterol (DUONEB) 0.5-2.5 (3) MG/3ML SOLN Take 3 mLs by nebulization every 6 (six) hours as needed. (Patient taking differently: Take 3 mLs by nebulization every 6 (six) hours as needed (SOB).) 360 mL 1 Past Month   Multiple Vitamin (MULTIVITAMIN WITH MINERALS) TABS tablet Take 1 tablet by mouth daily.   Past Week   Nystatin (GERHARDT'S BUTT CREAM) CREA Apply 1 application. topically daily as needed for irritation.   Past Week   nystatin (MYCOSTATIN) 100000 UNIT/ML suspension Take 5 mLs (500,000 Units total) by mouth 4 (four) times daily. 60 mL 2 08/14/2022   propranolol (INDERAL) 20 MG tablet Take 20 mg by mouth 3 (three) times daily.   08/14/2022 at 0800    senna-docusate (SENOKOT-S) 8.6-50 MG tablet Take 1 tablet by mouth 2 (two) times daily between meals as needed for mild constipation. 180 tablet 0 Past Week   thiamine 100 MG tablet Take 1 tablet (100 mg total) by mouth daily. 30 tablet 1 Past Week   amantadine (SYMMETREL) 100 MG capsule Take 1 capsule (100 mg total) by mouth 2 (two) times daily. (Patient not taking: Reported on 08/15/2022) 60 capsule 1 Not Taking   clopidogrel (PLAVIX) 75 MG tablet Take 1 tablet (75 mg total) by mouth daily. 90 tablet 1 08/12/2022 at 0800   Misc. Devices MISC Urinalysis, urine culture and sensitivity 1 each 0    polyethylene glycol powder (MIRALAX) 17 GM/SCOOP powder Take 17 g by mouth 2 (two) times daily as needed for moderate constipation. (Patient not taking: Reported on 06/14/2022) 255 g 0 Not Taking   propranolol (INDERAL) 10 MG tablet Take 1 tablet (10 mg total) by mouth 3 (three) times daily. (Patient not taking: Reported on 08/15/2022) 270 tablet 1 Not Taking   sulfamethoxazole-trimethoprim (BACTRIM DS) 800-160 MG tablet Take 1 tablet by mouth 2 (two) times daily. (Patient not taking: Reported on 08/15/2022)   Not Taking   Scheduled:  amantadine  100 mg Per Tube BID   atorvastatin  80 mg Per Tube Daily   Chlorhexidine Gluconate Cloth  6 each Topical Daily   clopidogrel  75 mg Per Tube Daily   folic acid  1 mg Per Tube Daily   free water  100 mL Per Tube Q6H   thiamine (VITAMIN B1) injection  100 mg Intravenous Daily   Continuous:  azithromycin 500 mg (08/19/22 1045)   ceFEPime (MAXIPIME) IV  1 g (08/19/22 0928)   feeding supplement (OSMOLITE 1.2 CAL) 1,000 mL (08/18/22 1726)   potassium PHOSPHATE IVPB (in mmol)     [id number] **OR** acetaminophen, ondansetron **OR** ondansetron (ZOFRAN) IV Anti-infectives (From admission, onward)    Start     Dose/Rate Route Frequency Ordered Stop   08/16/22 2300  vancomycin (VANCOREADY) IVPB 1250 mg/250 mL  Status:  Discontinued        1,250 mg 166.7 mL/hr  over 90 Minutes Intravenous Every 24 hours 08/15/22 1157 08/15/22 1157   08/16/22 1800  ceFEPIme (MAXIPIME) 1 g in sodium chloride 0.9 % 100 mL IVPB        1 g 200 mL/hr over 30 Minutes Intravenous 2 times daily 08/16/22 1514     08/15/22 2300  vancomycin (VANCOREADY) IVPB 1250 mg/250 mL  Status:  Discontinued        1,250 mg 166.7 mL/hr over 90 Minutes Intravenous Every 24 hours 08/15/22 1157 08/16/22 1121   08/15/22 1100  azithromycin (ZITHROMAX) 500 mg in sodium chloride 0.9 % 250 mL IVPB        500 mg 250 mL/hr over 60 Minutes Intravenous Every 24 hours 08/15/22 1049     08/15/22 1000  vancomycin (VANCOREADY) IVPB 750 mg/150 mL  Status:  Discontinued        750 mg 150 mL/hr over 60 Minutes Intravenous Every 12 hours 08/15/22 0533 08/15/22 1157   08/15/22 0800  ceFEPIme (MAXIPIME) 2 g in sodium chloride 0.9 % 100 mL IVPB  Status:  Discontinued        2 g 200 mL/hr over 30 Minutes Intravenous Every 8 hours 08/15/22 0528 08/16/22 1514   08/14/22 2345  aztreonam (AZACTAM) 2 g in sodium chloride 0.9 % 100 mL IVPB  Status:  Discontinued        2 g 200 mL/hr over 30 Minutes Intravenous  Once 08/14/22 2331 08/14/22 2334   08/14/22 2345  metroNIDAZOLE (FLAGYL) IVPB 500 mg        500 mg 100 mL/hr over 60 Minutes Intravenous  Once 08/14/22 2331 08/15/22 0247   08/14/22 2345  vancomycin (VANCOCIN) IVPB 1000 mg/200 mL premix  Status:  Discontinued        1,000 mg 200 mL/hr over 60 Minutes Intravenous  Once 08/14/22 2331 08/14/22 2334   08/14/22 2345  ceFEPIme (MAXIPIME) 2 g in sodium chloride 0.9 % 100 mL IVPB        2 g 200 mL/hr over 30 Minutes Intravenous  Once 08/14/22 2334 08/15/22 0102   08/14/22 2345  vancomycin (VANCOREADY) IVPB 1250 mg/250 mL        1,250 mg 166.7 mL/hr over 90 Minutes Intravenous  Once 08/14/22 2334 08/15/22 0316        ROS:  History obtained from chart review  General ROS: negative for - chills, fatigue, fever, night sweats, weight gain or weight loss Psychological ROS: negative for - behavioral disorder, hallucinations, memory difficulties, mood swings or suicidal ideation Ophthalmic ROS: negative for - blurry vision, double vision, eye pain or loss of vision ENT ROS: negative for - epistaxis, nasal discharge, oral lesions, sore throat, tinnitus or vertigo Allergy and Immunology ROS: negative for - hives or itchy/watery eyes Hematological and Lymphatic ROS: negative for - bleeding problems, bruising or swollen lymph nodes Endocrine ROS: negative for - galactorrhea, hair pattern changes, polydipsia/polyuria or temperature intolerance Respiratory ROS: negative for - cough, hemoptysis, shortness of breath or wheezing Cardiovascular ROS: negative for - chest pain, dyspnea on exertion, edema or irregular heartbeat Gastrointestinal ROS: negative for - abdominal pain, diarrhea, hematemesis, nausea/vomiting or stool incontinence Genito-Urinary ROS: negative for - dysuria, hematuria, incontinence or urinary frequency/urgency Musculoskeletal ROS: negative for - joint swelling or muscular weakness Neurological ROS: as noted in HPI Dermatological ROS: negative for rash and skin lesion changes   Blood pressure 123/84, pulse 88, temperature 98.8 F (37.1 C), temperature source Axillary, resp. rate 19, height 5\' 4"  (1.626 m), weight 48.8 kg, SpO2 100 %.   General Examination:                                                                                                      Physical Exam  General: Lethargic, opens eyes to voice, NAD HEENT-  Normocephalic, no lesions, without obvious abnormality.  Normal external eye and conjunctiva.   Resp: Normal work of breathing on room air Musculoskeletal-no joint tenderness, deformity or swelling. There is notable stiffness on all extremeties.  Skin-warm and dry, no  hyperpigmentation, vitiligo, or suspicious lesions  Neurological Examination Mental Status: Lethargic, opens eyes to voice. Does not follow any commands, nonverbal.  Cranial Nerves: II: Discs flat bilaterally III,IV, VI: ptosis not present, no extra-ocular motions bilaterally, PERRLA V,VII:  grimace symmetrical  VIII: hearing grossly normal bilaterally IX,X: unable to asses XI: unable to asses XII: unable to asses Motor: BUE and BLE unable to hold up against gravity. Wiggles toes bilaterally spontaneously.  Tone and bulk: generalized mild muscle atrophy.  Sensory: Some localization to pinprick and light touch, otherwise unable to asses.   Cerebellar: Unable to assess for FNF, Rapid alternating movements, or Heel-to-shin Gait: not tested   Lab Results: Basic Metabolic Panel: Recent Labs  Lab 08/16/22 0508 08/17/22 1029 08/17/22 1519 08/18/22 0336 08/19/22 0341  NA 146* 140 143 143 141  K 4.5 2.3* 2.6* 4.0 4.3  CL 114* 92* 98 104 110  CO2 16* 37* 32 26 23  GLUCOSE 63* 426* 105* 76 109*  BUN 10 <5* 6 5* <5*  CREATININE 1.08* 0.75 0.77 0.74 0.79  CALCIUM 9.3 7.4* 8.5* 8.4* 8.4*  MG 2.2 1.4* 1.6* 2.3 2.0  PHOS  --   --   --   --  2.0*    CBC: Recent Labs  Lab 08/15/22 0035 08/15/22 0849 08/16/22 0641 08/17/22 0407 08/18/22 0336 08/19/22 0503  WBC 7.1 8.3  7.9 5.8 6.3 6.0  NEUTROABS 4.7  --   --  3.8 4.2 3.9  HGB 10.6* 8.4* 7.9* 8.4* 8.2* 7.1*  HCT 32.5* 25.5* 24.0* 24.9* 24.7* 21.4*  MCV 86.4 85.6 84.2 81.4 83.2 83.9  PLT 304 217 187 198 191 PLATELET CLUMPS NOTED ON SMEAR, UNABLE TO ESTIMATE    Cardiac Enzymes: No results for input(s): "CKTOTAL", "CKMB", "CKMBINDEX", "TROPONINI" in the last 168 hours.  Lipid Panel: No results for input(s): "CHOL", "TRIG", "HDL", "CHOLHDL", "VLDL", "LDLCALC" in the last 168 hours.  Imaging: EEG adult  Result Date: 08/17/2022 Greta Doom, MD     08/17/2022  7:26 PM History: 51 year old female being evaluated for  encephalopathy Sedation: None Technique: This EEG was acquired with electrodes placed according to the International 10-20 electrode system (including Fp1, Fp2, F3, F4, C3, C4, P3, P4, O1, O2, T3, T4, T5, T6, A1, A2, Fz, Cz, Pz). The following electrodes were missing or displaced: none. Background: There is a posterior dominant rhythm of 7 to 8 Hz that is seen at times.  Muscle artifact obscures much of the recording, but when the waking background is visible in addition there is generalized irregular delta and theta activities seen in the waking background. Photic stimulation: Physiologic driving is not performed EEG Abnormalities: 1) generalized irregular slow activity 2) slow posterior dominant rhythm Clinical Interpretation: This EEG is consistent with a generalized nonspecific cerebral dysfunction (encephalopathy). There was no seizure or seizure predisposition recorded on this study. Please note that lack of epileptiform activity on EEG does not preclude the possibility of epilepsy. Roland Rack, MD [facility] [phone] If 7pm- 7am, please page neurology on call as listed in [place].   DG Abd 1 View  Result Date: 08/17/2022 CLINICAL DATA:  NG tube placement. EXAM: ABDOMEN - 1 VIEW COMPARISON:  05/12/2022 FINDINGS: An enteric tube is noted with tip overlying the mid stomach. The bowel gas pattern is unremarkable. IMPRESSION: Enteric tube with tip overlying the mid stomach. Electronically Signed   By: Margarette Canada M.D.   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 Patient neurological evaluation remains unchanged form prior, is lethargic and unable to follow  commands.   She has poor baseline as documented in our prior evaluation and likely worsened due to recrudescence of prior stroke symptoms in the setting of pneumonia. rEEG obtained earlier with no seizures or epileptiform discharges.  No family at bedside, attempted to contact family but unable to get in touch with them over phone. Will reach out to family but patient's poor prognosis has been discussed with them multiple times in the past and family not very receptive to that conversation in the past.   Recommendations: - MRI Brain without contrast - TSH, B12, folate, Thiamine levels. - no further workup if above non revealing.     Christene Slates, MD PGY-1 08/19/2022, 1:20 PM   NEUROHOSPITALIST ADDENDUM Performed a face to face diagnostic evaluation.   I have reviewed the contents of history and physical exam as documented by PA/ARNP/Resident and agree with above documentation.  I have discussed and formulated the above plan as documented. Edits to the note have been made as needed.  Donnetta Simpers, MD [facility] [phone]   If  7pm to 7am, please call on call as listed on [facility].

## 2022-08-19 NOTE — Progress Notes (Addendum)
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  On presentation, she was tachycardic with lactic acid of 2.3, WBC of 7.1, sodium 149.  Chest x-ray showed unchanged cardiomegaly with small pleural effusion.  UA was concerning for UTI.  COVID-19 and influenza test were negative.  She was started on IV fluids and antibiotics.  Palliative care was consulted.  Assessment & Plan:   Possible community-acquired bacterial pneumonia Mucoid debris in the left-sided airways with postobstructive collapse of left upper and lower lobes -Presented with cough.  CTA chest was negative for PE but showed mucoid debris in the left sided airways with left upper lobe and left lower lobe collapse.  Discussed with pulmonary via secure chat on 08/15/2022 who recommended aggressive chest physiotherapy.  If respiratory status worsens, they can be officially involved.  I have consulted respiratory therapy. -Initially started on broad-spectrum antibiotics.  DC'd vancomycin on 08/16/2022.  Continue cefepime and Zithromax.  Will finish 7-day course of therapy.  Today is day #6. -COVID-19 and influenza testing negative on presentation.  Cultures negative so far. -consult PCCM  Severe sepsis: Present on admission -Possibly from pneumonia and UTI -Hemodynamically improving.  Blood cultures negative so far.  UTI, possibly associated with Foley catheter Chronic urinary retention with chronic indwelling Foley catheter -Foley catheter apparently changed within the last 2 weeks. -Continue antibiotics as above.  Outpatient follow-up with urology.  Acute metabolic encephalopathy Prior CVA, unspecified with prior residual  deficits Goals of care -Patient presented with worsening mental status changes.  Almost nonverbal at baseline and mostly bedbound due to prior stroke. -Mental status still not improving.  Still hardly responsive.   -Gabapentin on hold.  Cymbalta on hold -Palliative care following.  Overall prognosis is poor.  Patient remains full code. -SLP following: Recommends n.p.o. -NGT placed on 08/17/2022: Start tube feeding as per dietary recommendations. -Follow PT recommendations -CT of the head without contrast was negative for any acute intracranial abnormity on 08/16/2022.  EEG negative for seizures. -B12, ammonia and TSH levels normal.  Start empiric folic acid  -Will get MRI of brain and neurology eval  Hyponatremia -Resolved  Hypernatremia -Possibly from dehydration.  Resolved.  Acute metabolic acidosis -Treated with bicarb drip.  Resolved.  Off bicarb drip.  Hypokalemia -Improved  Hypomagnesemia -Improved  Anemia of chronic disease -From chronic illnesses.  Hemoglobin slightly drifting down, 7.1 today.  No signs of bleeding.  Check iron profile in AM.  Stage II right buttock pressure ulcer: Present on admission -Continue local wound care    DVT prophylaxis: Lovenox Code Status: Full Family Communication: Spoke to daughters on phone on 08/19/2022 Disposition Plan: Status is: Inpatient Remains inpatient appropriate because: Of severity of illness    Consultants: Palliative care  Procedures: EEG  Antimicrobials:  Anti-infectives (From admission, onward)    Start     Dose/Rate Route Frequency Ordered Stop   08/16/22 2300  vancomycin (VANCOREADY) IVPB 1250 mg/250 mL  Status:  Discontinued        1,250 mg 166.7 mL/hr over 90 Minutes Intravenous Every 24 hours 08/15/22 1157 08/15/22 1157   08/16/22 1800  ceFEPIme (MAXIPIME) 1 g in sodium chloride 0.9 % 100 mL IVPB        1 g 200  mL/hr over 30 Minutes Intravenous 2 times daily 08/16/22 1514     08/15/22 2300  vancomycin  (VANCOREADY) IVPB 1250 mg/250 mL  Status:  Discontinued        1,250 mg 166.7 mL/hr over 90 Minutes Intravenous Every 24 hours 08/15/22 1157 08/16/22 1121   08/15/22 1100  azithromycin (ZITHROMAX) 500 mg in sodium chloride 0.9 % 250 mL IVPB        500 mg 250 mL/hr over 60 Minutes Intravenous Every 24 hours 08/15/22 1049     08/15/22 1000  vancomycin (VANCOREADY) IVPB 750 mg/150 mL  Status:  Discontinued        750 mg 150 mL/hr over 60 Minutes Intravenous Every 12 hours 08/15/22 0533 08/15/22 1157   08/15/22 0800  ceFEPIme (MAXIPIME) 2 g in sodium chloride 0.9 % 100 mL IVPB  Status:  Discontinued        2 g 200 mL/hr over 30 Minutes Intravenous Every 8 hours 08/15/22 0528 08/16/22 1514   08/14/22 2345  aztreonam (AZACTAM) 2 g in sodium chloride 0.9 % 100 mL IVPB  Status:  Discontinued        2 g 200 mL/hr over 30 Minutes Intravenous  Once 08/14/22 2331 08/14/22 2334   08/14/22 2345  metroNIDAZOLE (FLAGYL) IVPB 500 mg        500 mg 100 mL/hr over 60 Minutes Intravenous  Once 08/14/22 2331 08/15/22 0247   08/14/22 2345  vancomycin (VANCOCIN) IVPB 1000 mg/200 mL premix  Status:  Discontinued        1,000 mg 200 mL/hr over 60 Minutes Intravenous  Once 08/14/22 2331 08/14/22 2334   08/14/22 2345  ceFEPIme (MAXIPIME) 2 g in sodium chloride 0.9 % 100 mL IVPB        2 g 200 mL/hr over 30 Minutes Intravenous  Once 08/14/22 2334 08/15/22 0102   08/14/22 2345  vancomycin (VANCOREADY) IVPB 1250 mg/250 mL        1,250 mg 166.7 mL/hr over 90 Minutes Intravenous  Once 08/14/22 2334 08/15/22 0316        Subjective: Patient seen and examined at bedside.  No agitation, fever, vomiting reported.  Hardly wakes up currently  objective: Vitals:   08/18/22 2002 08/19/22 0000 08/19/22 0337 08/19/22 0739  BP: (!) 126/103 (!) 132/100 (!) 127/92 (!) 128/98  Pulse: (!) 107 96 92 79  Resp: 19 19 20 18   Temp: 97.9 F (36.6 C) 98.2 F (36.8 C) 98.6 F (37 C) 98.8 F (37.1 C)  TempSrc: Axillary Axillary  Axillary Axillary  SpO2: 98% 96% 99% 100%  Weight:      Height:        Intake/Output Summary (Last 24 hours) at 08/19/2022 0810 Last data filed at 08/19/2022 0739 Gross per 24 hour  Intake --  Output 1600 ml  Net -1600 ml    Filed Weights   08/15/22 1824  Weight: 48.8 kg    Examination:  General: On room air currently.  No distress.  Looks chronically ill and deconditioned. ENT/neck:  Cortrak with ongoing feeding noted.   respiratory: Decreased breath sounds at bases bilaterally with some crackles CVS: S1 and S2 heard; slightly tachycardic intermittently Abdominal: Soft, nontender, distended slightly; no organomegaly, bowel sounds are normally  extremities: Trace lower extremity edema present; no cyanosis  CNS: Hardly wakes up currently  lymph: No obvious palpable lymphadenopathy noted  skin: No obvious lesions/ecchymosis  psych: Cannot assess because of mental status.  Showing no signs of agitation currently.   Musculoskeletal:  No obvious joint erythema/tenderness    Data Reviewed: I have personally reviewed following labs and imaging studies  CBC: Recent Labs  Lab 08/15/22 0035 08/15/22 0849 08/16/22 0641 08/17/22 0407 08/18/22 0336 08/19/22 0503  WBC 7.1 8.3 7.9 5.8 6.3 6.0  NEUTROABS 4.7  --   --  3.8 4.2 3.9  HGB 10.6* 8.4* 7.9* 8.4* 8.2* 7.1*  HCT 32.5* 25.5* 24.0* 24.9* 24.7* 21.4*  MCV 86.4 85.6 84.2 81.4 83.2 83.9  PLT 304 217 187 198 191 PLATELET CLUMPS NOTED ON SMEAR, UNABLE TO ESTIMATE    Basic Metabolic Panel: Recent Labs  Lab 08/16/22 0508 08/17/22 1029 08/17/22 1519 08/18/22 0336 08/19/22 0341  NA 146* 140 143 143 141  K 4.5 2.3* 2.6* 4.0 4.3  CL 114* 92* 98 104 110  CO2 16* 37* 32 26 23  GLUCOSE 63* 426* 105* 76 109*  BUN 10 <5* 6 5* <5*  CREATININE 1.08* 0.75 0.77 0.74 0.79  CALCIUM 9.3 7.4* 8.5* 8.4* 8.4*  MG 2.2 1.4* 1.6* 2.3 2.0  PHOS  --   --   --   --  2.0*    GFR: Estimated Creatinine Clearance: 64.1 mL/min (by C-G formula  based on SCr of 0.79 mg/dL). Liver Function Tests: Recent Labs  Lab 08/15/22 0242 08/17/22 1029  AST 16 14*  ALT 9 7  ALKPHOS 60 58  BILITOT 1.0 0.6  PROT 5.6* 4.7*  ALBUMIN 2.9* 2.1*    No results for input(s): "LIPASE", "AMYLASE" in the last 168 hours. Recent Labs  Lab 08/17/22 0407  AMMONIA 18    Coagulation Profile: Recent Labs  Lab 08/15/22 0035  INR 1.1    Cardiac Enzymes: No results for input(s): "CKTOTAL", "CKMB", "CKMBINDEX", "TROPONINI" in the last 168 hours. BNP (last 3 results) No results for input(s): "PROBNP" in the last 8760 hours. HbA1C: No results for input(s): "HGBA1C" in the last 72 hours. CBG: Recent Labs  Lab 08/18/22 1754 08/18/22 2026 08/18/22 2358 08/19/22 0314 08/19/22 0741  GLUCAP 62* 100* 100* 116* 127*    Lipid Profile: No results for input(s): "CHOL", "HDL", "LDLCALC", "TRIG", "CHOLHDL", "LDLDIRECT" in the last 72 hours. Thyroid Function Tests: Recent Labs    08/17/22 0407  TSH 1.552    Anemia Panel: Recent Labs    08/17/22 0407  VITAMINB12 515    Sepsis Labs: Recent Labs  Lab 08/15/22 0005 08/15/22 0425  LATICACIDVEN 2.3* 1.4     Recent Results (from the past 240 hour(s))  Resp Panel by RT-PCR (Flu A&B, Covid) Anterior Nasal Swab     Status: None   Collection Time: 08/15/22 12:05 AM   Specimen: Anterior Nasal Swab  Result Value Ref Range Status   SARS Coronavirus 2 by RT PCR NEGATIVE NEGATIVE Final    Comment: (NOTE) SARS-CoV-2 target nucleic acids are NOT DETECTED.  The SARS-CoV-2 RNA is generally detectable in upper respiratory specimens during the acute phase of infection. The lowest concentration of SARS-CoV-2 viral copies this assay can detect is 138 copies/mL. A negative result does not preclude SARS-Cov-2 infection and should not be used as the sole basis for treatment or other patient management decisions. A negative result may occur with  improper specimen collection/handling, submission of  specimen other than nasopharyngeal swab, presence of viral mutation(s) within the areas targeted by this assay, and inadequate number of viral copies(<138 copies/mL). A negative result must be combined with clinical observations, patient history, and epidemiological information. The expected result is Negative.  Fact Sheet for Patients:  BloggerCourse.com  Fact Sheet for Healthcare Providers:  SeriousBroker.it  This test is no t yet approved or cleared by the [place] FDA and  has been authorized for detection and/or diagnosis of SARS-CoV-2 by FDA under an Emergency Use Authorization (EUA). This EUA will remain  in effect (meaning this test can be used) for the duration of the COVID-19 declaration under Section 564(b)(1) of the Act, 21 U.S.C.section 360bbb-3(b)(1), unless the authorization is terminated  or revoked sooner.       Influenza A by PCR NEGATIVE NEGATIVE Final   Influenza B by PCR NEGATIVE NEGATIVE Final    Comment: (NOTE) The Xpert Xpress SARS-CoV-2/FLU/RSV plus assay is intended as an aid in the diagnosis of influenza from Nasopharyngeal swab specimens and should not be used as a sole basis for treatment. Nasal washings and aspirates are unacceptable for Xpert Xpress SARS-CoV-2/FLU/RSV testing.  Fact Sheet for Patients: BloggerCourse.com  Fact Sheet for Healthcare Providers: SeriousBroker.it  This test is not yet approved or cleared by the [place] FDA and has been authorized for detection and/or diagnosis of SARS-CoV-2 by FDA under an Emergency Use Authorization (EUA). This EUA will remain in effect (meaning this test can be used) for the duration of the COVID-19 declaration under Section 564(b)(1) of the Act, 21 U.S.C. section 360bbb-3(b)(1), unless the authorization is terminated or revoked.  Performed at [facility], [address]  [place], [place] [address]   Blood Culture (routine x 2)     Status: None (Preliminary result)   Collection Time: 08/15/22 12:08 AM   Specimen: BLOOD RIGHT HAND  Result Value Ref Range Status   Specimen Description BLOOD RIGHT HAND  Final   Special Requests   Final    BOTTLES DRAWN AEROBIC AND ANAEROBIC Blood Culture adequate volume   Culture   Final    NO GROWTH 4 DAYS Performed at [facility], [address], [place], [place] [address]    Report Status PENDING  Incomplete  Blood Culture (routine x 2)     Status: None (Preliminary result)   Collection Time: 08/15/22 12:40 AM   Specimen: BLOOD LEFT HAND  Result Value Ref Range Status   Specimen Description BLOOD LEFT HAND  Final   Special Requests   Final    BOTTLES DRAWN AEROBIC AND ANAEROBIC Blood Culture adequate volume   Culture   Final    NO GROWTH 4 DAYS Performed at [facility], [address] [place], [place] [address]    Report Status PENDING  Incomplete         Radiology Studies: EEG adult  Result Date: 2022/08/21 Rejeana Brock, MD     08-21-22  7:26 PM History: 51 year old female being evaluated for encephalopathy Sedation: None Technique: This EEG was acquired with electrodes placed according to the International 10-20 electrode system (including Fp1, Fp2, F3, F4, C3, C4, P3, P4, O1, O2, T3, T4, T5, T6, A1, A2, Fz, Cz, Pz). The following electrodes were missing or displaced: none. Background: There is a posterior dominant rhythm of 7 to 8 Hz that is seen at times.  Muscle artifact obscures much of the recording, but when the waking background is visible in addition there is generalized irregular delta and theta activities seen in the waking background. Photic stimulation: Physiologic driving is not performed EEG Abnormalities: 1) generalized irregular slow activity 2) slow posterior dominant rhythm Clinical Interpretation: This EEG is consistent with a generalized nonspecific cerebral dysfunction  (encephalopathy). There was no  seizure or seizure predisposition recorded on this study. Please note that lack of epileptiform activity on EEG does not preclude the possibility of epilepsy. Ritta Slot, MD [facility] [phone] If 7pm- 7am, please page neurology on call as listed in AMION.   DG Abd 1 View  Result Date: 08/17/2022 CLINICAL DATA:  NG tube placement. EXAM: ABDOMEN - 1 VIEW COMPARISON:  05/12/2022 FINDINGS: An enteric tube is noted with tip overlying the mid stomach. The bowel gas pattern is unremarkable. IMPRESSION: Enteric tube with tip overlying the mid stomach. Electronically Signed   By: Harmon Pier M.D.   On: 08/17/2022 16:57        Scheduled Meds:  amantadine  100 mg Per Tube BID   atorvastatin  80 mg Per Tube Daily   Chlorhexidine Gluconate Cloth  6 each Topical Daily   clopidogrel  75 mg Per Tube Daily   enoxaparin (LOVENOX) injection  40 mg Subcutaneous Daily   free water  100 mL Per Tube Q6H   thiamine (VITAMIN B1) injection  100 mg Intravenous Daily   Continuous Infusions:  azithromycin 500 mg (08/18/22 1207)   ceFEPime (MAXIPIME) IV 1 g (08/18/22 2138)   dextrose 75 mL/hr at 08/18/22 1921   feeding supplement (OSMOLITE 1.2 CAL) 1,000 mL (08/18/22 1726)          Glade Lloyd, MD [facility] 08/19/2022, 8:10 AM

## 2022-08-19 NOTE — Progress Notes (Signed)
Daily Progress Note   Patient Name: Cindy Brooks       Date: 08/19/2022 DOB: 01/15/1971  Age: 51 y.o. MRN#: [id number] Attending Physician: Cindy August, MD Primary Care Physician: Cindy Rakes, MD Admit Date: 08/14/2022  Reason for Consultation/Follow-up: Establishing goals of care  Subjective: Medical records reviewed including progress notes, labs, imaging. Patient assessed at the bedside.  She remains minimally responsive.  No family present during my visit.  I called Cindy Brooks for ongoing goals of care discussion.  Provided with updates, emotional support and therapeutic listening.  She states this has been her worst experience ever at [facility].  She is considering pursuing a transfer to another hospital but does not want to proceed just yet.  We discussed current plans for a pulmonology consult, neurology consult, and brain MRI.  We also discussed plans for an iron profile to be checked tomorrow given that patient's hemoglobin is lower today.  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  Counseled on hospice resources available in different settings and explained that patients can be certified as eligible every 6 months.  I shared my professional opinion that patient is appropriate for hospice at this time given her ongoing poor mental status.  She clarifies with me that family is not there yet and she is just trying to find out all the information about different options.  Questions and concerns addressed. PMT will continue to support holistically.   Length of Stay: 4  Physical  Exam Vitals and nursing note reviewed.  Constitutional:      General: She is not in acute distress.    Appearance: She is ill-appearing.  Cardiovascular:     Rate and Rhythm: Tachycardia present.  Pulmonary:     Effort: Pulmonary effort is normal.  Skin:    General: Skin is cool and dry.  Neurological:     Mental Status: She is unresponsive.           Vital Signs: BP 123/84 (BP Location: Right Arm)   Pulse 88   Temp 98.8 F (37.1 C) (Axillary)   Resp 19   Ht 5\' 4"  (1.626 m)   Wt 48.8  kg   LMP  (LMP Unknown)   SpO2 100%   BMI 18.47 kg/m  SpO2: SpO2: 100 % O2 Device: O2 Device: Room Air O2 Flow Rate:        Palliative Assessment/Data: 10%   Palliative Care Assessment & Plan   Patient Profile: 51 y.o. female  with past medical history of recurrent UTI, CVA with deficits, depression, chronic pain admitted on 08/14/2022 with cough and altered mental status.    Patient is admitted for severe sepsis due to community-acquired pneumonia, UTI.  PMT has been consulted to assist with goals of care conversation.   Assessment: Goals of care conversation Recurrent UTI CVA with deficits Bacterial pneumonia Acute metabolic encephalopathy Acute metabolic acidosis  Recommendations/Plan: Continue full code/full scope treatment Patient continues to have poor mental status with little to no improvement, appropriate for comfort care and hospice however family is not ready at this time Psychosocial and emotional support provided PMT will continue to follow and support   Prognosis: Poor  Discharge Planning: To Be Determined  Care plan was discussed with RN, patient's daughter     MDM high         Cindy Brooks Cindy Salles, PA-C  Palliative Medicine Team Team phone # [phone]  Thank you for allowing the Palliative Medicine Team to assist in the care of this patient. Please utilize secure chat with additional questions, if there is no response within 30 minutes please call  the above phone number.  Palliative Medicine Team providers are available by phone from 7am to 7pm daily and can be reached through the team cell phone.  Should this patient require assistance outside of these hours, please call the patient's attending physician.

## 2022-08-19 NOTE — Consult Note (Incomplete)
NAME:  Cindy Brooks, MRN:  [id number], DOB:  Apr 29, 1971, LOS: 4 ADMISSION DATE:  08/14/2022, CONSULTATION DATE:   08/19/2022 REFERRING MD:  Hanley Ben, CHIEF COMPLAINT:  Decreased LOC in setting of  CAP with postobstructive collapse of left upper and lower lobes   History of Present Illness:    Pertinent  Medical History  ***  Significant Hospital Events: Including procedures, antibiotic start and stop dates in addition to other pertinent events     Interim History / Subjective:  ***  Objective   Blood pressure 123/84, pulse 88, temperature 98.8 F (37.1 C), temperature source Axillary, resp. rate 19, height 5\' 4"  (1.626 m), weight 48.8 kg, SpO2 100 %.        Intake/Output Summary (Last 24 hours) at 08/19/2022 1323 Last data filed at 08/19/2022 13/03/2022 Gross per 24 hour  Intake --  Output 1600 ml  Net -1600 ml   Filed Weights   08/15/22 1824  Weight: 48.8 kg    Examination: General: *** HENT: *** Lungs: *** Cardiovascular: *** Abdomen: *** Extremities: *** Neuro: *** GU: ***  Resolved Hospital Problem list   ***  Assessment & Plan:  ***  Best Practice (right click and "Reselect all SmartList Selections" daily)   Diet/type: {diet type:25684} DVT prophylaxis: {anticoagulation (Optional):25687} GI prophylaxis: 13/02/23 Lines: {Central Venous Access:25771} Foley:  {Central Venous Access:25691} Code Status:  {Code Status:26939} Last date of multidisciplinary goals of care discussion [***]  Labs   CBC: Recent Labs  Lab 08/15/22 0035 08/15/22 0849 08/16/22 0641 08/17/22 0407 08/18/22 0336 08/19/22 0503  WBC 7.1 8.3 7.9 5.8 6.3 6.0  NEUTROABS 4.7  --   --  3.8 4.2 3.9  HGB 10.6* 8.4* 7.9* 8.4* 8.2* 7.1*  HCT 32.5* 25.5* 24.0* 24.9* 24.7* 21.4*  MCV 86.4 85.6 84.2 81.4 83.2 83.9  PLT 304 217 187 198 191 PLATELET CLUMPS NOTED ON SMEAR, UNABLE TO ESTIMATE    Basic Metabolic Panel: Recent Labs  Lab 08/16/22 0508 08/17/22 1029 08/17/22 1519  08/18/22 0336 08/19/22 0341  NA 146* 140 143 143 141  K 4.5 2.3* 2.6* 4.0 4.3  CL 114* 92* 98 104 110  CO2 16* 37* 32 26 23  GLUCOSE 63* 426* 105* 76 109*  BUN 10 <5* 6 5* <5*  CREATININE 1.08* 0.75 0.77 0.74 0.79  CALCIUM 9.3 7.4* 8.5* 8.4* 8.4*  MG 2.2 1.4* 1.6* 2.3 2.0  PHOS  --   --   --   --  2.0*   GFR: Estimated Creatinine Clearance: 64.1 mL/min (by C-G formula based on SCr of 0.79 mg/dL). Recent Labs  Lab 08/15/22 0005 08/15/22 0035 08/15/22 0425 08/15/22 0849 08/16/22 0641 08/17/22 0407 08/18/22 0336 08/19/22 0503  WBC  --    < >  --    < > 7.9 5.8 6.3 6.0  LATICACIDVEN 2.3*  --  1.4  --   --   --   --   --    < > = values in this interval not displayed.    Liver Function Tests: Recent Labs  Lab 08/15/22 0242 08/17/22 1029  AST 16 14*  ALT 9 7  ALKPHOS 60 58  BILITOT 1.0 0.6  PROT 5.6* 4.7*  ALBUMIN 2.9* 2.1*   No results for input(s): "LIPASE", "AMYLASE" in the last 168 hours. Recent Labs  Lab 08/17/22 0407  AMMONIA 18    ABG    Component Value Date/Time   PHART 7.47 (H) 02/26/2022 1725   PCO2ART 41 02/26/2022 1725  PO2ART 73 (L) 02/26/2022 1725   HCO3 29.8 (H) 02/26/2022 1725   TCO2 25 08/04/2022 1703   ACIDBASEDEF 2.0 01/21/2022 0354   O2SAT 95.5 02/26/2022 1725     Coagulation Profile: Recent Labs  Lab 08/15/22 0035  INR 1.1    Cardiac Enzymes: No results for input(s): "CKTOTAL", "CKMB", "CKMBINDEX", "TROPONINI" in the last 168 hours.  HbA1C: Hgb A1c MFr Bld  Date/Time Value Ref Range Status  01/22/2022 03:15 AM 5.7 (H) 4.8 - 5.6 % Final    Comment:    (NOTE) Pre diabetes:          5.7%-6.4%  Diabetes:              >6.4%  Glycemic control for   <7.0% adults with diabetes     CBG: Recent Labs  Lab 08/18/22 2026 08/18/22 2358 08/19/22 0314 08/19/22 0741 08/19/22 1142  GLUCAP 100* 100* 116* 127* 116*    Review of Systems:   ***  Past Medical History:  She,  has a past medical history of Hypertension,  Hypertensive urgency (01/20/2022), Seizure (HCC) (01/28/2022), Stroke (HCC), TIA (transient ischemic attack) (2019), and Urinary tract infection (01/28/2022).   Surgical History:   Past Surgical History:  Procedure Laterality Date   ESOPHAGOGASTRODUODENOSCOPY N/A 01/28/2022   Procedure: ESOPHAGOGASTRODUODENOSCOPY (EGD);  Surgeon: Diamantina Monks, MD;  Location: [facility] ENDOSCOPY;  Service: General;  Laterality: N/A;   PEG PLACEMENT N/A 01/28/2022   Procedure: PERCUTANEOUS ENDOSCOPIC GASTROSTOMY (PEG) PLACEMENT;  Surgeon: Diamantina Monks, MD;  Location: [facility] ENDOSCOPY;  Service: General;  Laterality: N/A;     Social History:   reports that she has never smoked. She has never used smokeless tobacco. She reports that she does not currently use alcohol after a past usage of about 203.0 standard drinks of alcohol per week.   Family History:  Her family history includes Diabetes Mellitus II in her maternal grandmother.   Allergies Allergies  Allergen Reactions   Aspirin Hives and Rash   Codeine Hives and Rash   Penicillins Anaphylaxis, Swelling and Other (See Comments)   Strawberry Extract Anaphylaxis and Hives     Home Medications  Prior to Admission medications   Medication Sig Start Date End Date Taking? Authorizing Provider  acetaminophen (TYLENOL) 325 MG tablet Take 2 tablets (650 mg total) by mouth every 4 (four) hours as needed. Patient taking differently: Take 650 mg by mouth every 4 (four) hours as needed for mild pain. 03/21/22 03/21/23 Yes Almon Hercules, MD  atorvastatin (LIPITOR) 80 MG tablet Take 1 tablet (80 mg total) by mouth daily. 05/14/22  Yes Hoy Register, MD  chlorhexidine (PERIDEX) 0.12 % solution Use as directed 15 mLs in the mouth or throat 2 (two) times daily. 05/28/22  Yes Alwyn Ren, MD  DULoxetine (CYMBALTA) 60 MG capsule Take 1 capsule (60 mg total) by mouth daily. 05/14/22  Yes Newlin, Odette Horns, MD  Ensure Plus (ENSURE PLUS) LIQD Take 237 mLs by mouth 3 (three)  times daily between meals.   Yes [provider]  folic acid (FOLVITE) 1 MG tablet Take 1 tablet (1 mg total) by mouth daily. 03/21/22  Yes Almon Hercules, MD  gabapentin (NEURONTIN) 100 MG capsule Take 1 capsule (100 mg total) by mouth 2 (two) times daily. 05/14/22  Yes Hoy Register, MD  hydrALAZINE (APRESOLINE) 50 MG tablet Take 50 mg by mouth every 8 (eight) hours. 07/06/22  Yes [provider]  ipratropium-albuterol (DUONEB) 0.5-2.5 (3) MG/3ML SOLN Take 3 mLs by  nebulization every 6 (six) hours as needed. Patient taking differently: Take 3 mLs by nebulization every 6 (six) hours as needed (SOB). 03/21/22  Yes Mercy Riding, MD  Multiple Vitamin (MULTIVITAMIN WITH MINERALS) TABS tablet Take 1 tablet by mouth daily. 03/21/22  Yes Mercy Riding, MD  Nystatin (GERHARDT'S BUTT CREAM) CREA Apply 1 application. topically daily as needed for irritation. 03/21/22  Yes Mercy Riding, MD  nystatin (MYCOSTATIN) 100000 UNIT/ML suspension Take 5 mLs (500,000 Units total) by mouth 4 (four) times daily. 05/28/22  Yes Georgette Shell, MD  propranolol (INDERAL) 20 MG tablet Take 20 mg by mouth 3 (three) times daily.   Yes [provider]  senna-docusate (SENOKOT-S) 8.6-50 MG tablet Take 1 tablet by mouth 2 (two) times daily between meals as needed for mild constipation. 03/21/22  Yes Mercy Riding, MD  thiamine 100 MG tablet Take 1 tablet (100 mg total) by mouth daily. 03/21/22  Yes Mercy Riding, MD  amantadine (SYMMETREL) 100 MG capsule Take 1 capsule (100 mg total) by mouth 2 (two) times daily. Patient not taking: Reported on 08/15/2022 03/21/22 08/15/22  Mercy Riding, MD  clopidogrel (PLAVIX) 75 MG tablet Take 1 tablet (75 mg total) by mouth daily. 06/24/22   Charlott Rakes, MD  Misc. Devices MISC Urinalysis, urine culture and sensitivity 07/29/22   Charlott Rakes, MD  polyethylene glycol powder (MIRALAX) 17 GM/SCOOP powder Take 17 g by mouth 2 (two) times daily as needed for moderate  constipation. Patient not taking: Reported on 06/14/2022 03/21/22   Mercy Riding, MD  propranolol (INDERAL) 10 MG tablet Take 1 tablet (10 mg total) by mouth 3 (three) times daily. Patient not taking: Reported on 08/15/2022 06/24/22   Charlott Rakes, MD  sulfamethoxazole-trimethoprim (BACTRIM DS) 800-160 MG tablet Take 1 tablet by mouth 2 (two) times daily. Patient not taking: Reported on 08/15/2022 08/05/22   [provider]     Critical care time: ***

## 2022-08-19 NOTE — Progress Notes (Signed)
PHARMACY NOTE:  ANTIMICROBIAL RENAL DOSAGE ADJUSTMENT  Current antimicrobial regimen includes a mismatch between antimicrobial dosage and estimated renal function.  As per policy approved by the Pharmacy & Therapeutics and Medical Executive Committees, the antimicrobial dosage will be adjusted accordingly.  Current antimicrobial dosage:  cefepime 2g IV q12 hours  Indication: PNA/UTI  Renal Function:  Estimated Creatinine Clearance: 64.1 mL/min (by C-G formula based on SCr of 0.79 mg/dL). []      On intermittent HD, scheduled: []      On CRRT    Antimicrobial dosage has been changed to:  cefepime 2g IV q8 hours  Additional comments: plan 7 day stop date - added for 11/7   Thank you for allowing pharmacy to be a part of this patient's care.  Dimple Nanas, PharmD, BCPS 08/19/2022 2:37 PM

## 2022-08-19 NOTE — Progress Notes (Signed)
Speech Language Pathology Treatment: Dysphagia  Patient Details Name: Cindy Brooks MRN: [id number] DOB: [birth year] Today's Date: 08/19/2022 Time: 1100-1110 SLP Time Calculation (min) (ACUTE ONLY): 10 min  Assessment / Plan / Recommendation Clinical Impression  Pt remains too lethargic for PO intake despite cueing and stimulation provided by SLP. She showed no response to completion of oral care. When providing ice chips to lips for stimulation she intermittently would rub her lips together as a delayed form of response, but still no acceptance of boluses. This was also not consistent enough to warrant further trials. Recommend that she remain NPO.    HPI HPI: Cindy Brooks is a 51 y.o. female with medical history significant of recurrent UTI who presents emergency department due to cough and altered mental status.  Patient is nonverbal at baseline and bedbound due to prior stroke.  She had a two-month hospitalization in April of 2023 due to bilateral thalamic, midbrain, and bilateral cerebral infarctions. She had trach/PEG at that time and dysphagia and cognitive/language therapies. Chest CT reported "Mucoid debris in the left-sided airways with near-complete postobstructive collapse of the left lower lobe and partial postobstructive collapse of the left upper lobe, with significant associated volume loss and leftward mediastinal shift."  Most recent BSE on 03/25/22 with recommendations for Dysphagia 2 solids and thin liquids.      SLP Plan  Continue with current plan of care      Recommendations for follow up therapy are one component of a multi-disciplinary discharge planning process, led by the attending physician.  Recommendations may be updated based on patient status, additional functional criteria and insurance authorization.    Recommendations  Diet recommendations: NPO Medication Administration: Via alternative means                Oral Care Recommendations: Oral care  QID Follow Up Recommendations:  (tba) Assistance recommended at discharge: Frequent or constant Supervision/Assistance SLP Visit Diagnosis: Dysphagia, unspecified (R13.10) Plan: Continue with current plan of care           Osie Bond., M.A. [place] Office [phone]  Secure chat preferred   08/19/2022, 11:22 AM

## 2022-08-19 NOTE — Progress Notes (Signed)
CPT not indicated at this time. Patient is clear but decreased breath sounds and tube feedings are running.

## 2022-08-19 NOTE — Plan of Care (Signed)
  Problem: Education: Goal: Knowledge of General Education information will improve Description: Including pain rating scale, medication(s)/side effects and non-pharmacologic comfort measures Outcome: Not Progressing   Problem: Health Behavior/Discharge Planning: Goal: Ability to manage health-related needs will improve Outcome: Not Progressing   Problem: Clinical Measurements: Goal: Ability to maintain clinical measurements within normal limits will improve Outcome: Progressing Goal: Will remain free from infection Outcome: Progressing Goal: Diagnostic test results will improve Outcome: Progressing Goal: Respiratory complications will improve Outcome: Progressing Goal: Cardiovascular complication will be avoided Outcome: Progressing   Problem: Activity: Goal: Risk for activity intolerance will decrease Outcome: Not Progressing   Problem: Nutrition: Goal: Adequate nutrition will be maintained Outcome: Progressing   Problem: Coping: Goal: Level of anxiety will decrease Outcome: Progressing   Problem: Elimination: Goal: Will not experience complications related to bowel motility Outcome: Progressing Goal: Will not experience complications related to urinary retention Outcome: Progressing   Problem: Pain Managment: Goal: General experience of comfort will improve Outcome: Progressing   Problem: Safety: Goal: Ability to remain free from injury will improve Outcome: Progressing   Problem: Skin Integrity: Goal: Risk for impaired skin integrity will decrease Outcome: Progressing   Problem: Activity: Goal: Ability to tolerate increased activity will improve Outcome: Not Progressing   Problem: Respiratory: Goal: Ability to maintain adequate ventilation will improve Outcome: Progressing Goal: Ability to maintain a clear airway will improve Outcome: Progressing   Problem: Clinical Measurements: Goal: Ability to maintain a body temperature in the normal range will  improve Outcome: Progressing

## 2022-08-20 ENCOUNTER — Inpatient Hospital Stay (HOSPITAL_COMMUNITY): Payer: No Typology Code available for payment source

## 2022-08-20 DIAGNOSIS — G9341 Metabolic encephalopathy: Secondary | ICD-10-CM | POA: Diagnosis not present

## 2022-08-20 DIAGNOSIS — A419 Sepsis, unspecified organism: Secondary | ICD-10-CM | POA: Diagnosis not present

## 2022-08-20 DIAGNOSIS — R1310 Dysphagia, unspecified: Secondary | ICD-10-CM

## 2022-08-20 DIAGNOSIS — T17998A Other foreign object in respiratory tract, part unspecified causing other injury, initial encounter: Secondary | ICD-10-CM

## 2022-08-20 DIAGNOSIS — J159 Unspecified bacterial pneumonia: Secondary | ICD-10-CM | POA: Diagnosis not present

## 2022-08-20 DIAGNOSIS — N3000 Acute cystitis without hematuria: Secondary | ICD-10-CM | POA: Diagnosis not present

## 2022-08-20 LAB — IRON AND TIBC
Iron: 34 ug/dL (ref 28–170)
Saturation Ratios: 34 % — ABNORMAL HIGH (ref 10.4–31.8)
TIBC: 101 ug/dL — ABNORMAL LOW (ref 250–450)
UIBC: 67 ug/dL

## 2022-08-20 LAB — CULTURE, BLOOD (ROUTINE X 2)
Culture: NO GROWTH
Culture: NO GROWTH
Special Requests: ADEQUATE
Special Requests: ADEQUATE

## 2022-08-20 LAB — GLUCOSE, CAPILLARY
Glucose-Capillary: 108 mg/dL — ABNORMAL HIGH (ref 70–99)
Glucose-Capillary: 114 mg/dL — ABNORMAL HIGH (ref 70–99)
Glucose-Capillary: 129 mg/dL — ABNORMAL HIGH (ref 70–99)
Glucose-Capillary: 147 mg/dL — ABNORMAL HIGH (ref 70–99)
Glucose-Capillary: 80 mg/dL (ref 70–99)
Glucose-Capillary: 83 mg/dL (ref 70–99)

## 2022-08-20 LAB — CBC WITH DIFFERENTIAL/PLATELET
Abs Immature Granulocytes: 0.04 10*3/uL (ref 0.00–0.07)
Basophils Absolute: 0 10*3/uL (ref 0.0–0.1)
Basophils Relative: 0 %
Eosinophils Absolute: 0.1 10*3/uL (ref 0.0–0.5)
Eosinophils Relative: 1 %
HCT: 26.2 % — ABNORMAL LOW (ref 36.0–46.0)
Hemoglobin: 8.5 g/dL — ABNORMAL LOW (ref 12.0–15.0)
Immature Granulocytes: 1 %
Lymphocytes Relative: 31 %
Lymphs Abs: 1.9 10*3/uL (ref 0.7–4.0)
MCH: 27.1 pg (ref 26.0–34.0)
MCHC: 32.4 g/dL (ref 30.0–36.0)
MCV: 83.4 fL (ref 80.0–100.0)
Monocytes Absolute: 0.7 10*3/uL (ref 0.1–1.0)
Monocytes Relative: 12 %
Neutro Abs: 3.5 10*3/uL (ref 1.7–7.7)
Neutrophils Relative %: 55 %
Platelets: 183 10*3/uL (ref 150–400)
RBC: 3.14 MIL/uL — ABNORMAL LOW (ref 3.87–5.11)
RDW: 16 % — ABNORMAL HIGH (ref 11.5–15.5)
WBC: 6.3 10*3/uL (ref 4.0–10.5)
nRBC: 0 % (ref 0.0–0.2)

## 2022-08-20 LAB — BASIC METABOLIC PANEL
Anion gap: 7 (ref 5–15)
BUN: <5 mg/dL — ABNORMAL LOW (ref 6–20)
CO2: 25 mmol/L (ref 22–32)
Calcium: 8.4 mg/dL — ABNORMAL LOW (ref 8.9–10.3)
Chloride: 108 mmol/L (ref 98–111)
Creatinine, Ser: 0.75 mg/dL (ref 0.44–1.00)
GFR, Estimated: >60 mL/min (ref >60)
Glucose, Bld: 81 mg/dL (ref 70–99)
Potassium: 3.6 mmol/L (ref 3.5–5.1)
Sodium: 140 mmol/L (ref 135–145)

## 2022-08-20 LAB — FERRITIN: Ferritin: 802 ng/mL — ABNORMAL HIGH (ref 11–307)

## 2022-08-20 LAB — PROCALCITONIN: Procalcitonin: <0.1 ng/mL

## 2022-08-20 LAB — MAGNESIUM: Magnesium: 1.6 mg/dL — ABNORMAL LOW (ref 1.7–2.4)

## 2022-08-20 MED ORDER — ASPIRIN 81 MG PO CHEW
81.0000 mg | CHEWABLE_TABLET | Freq: Every day | ORAL | Status: DC
  Administered 2022-08-20 – 2022-08-24 (×3): 81 mg
  Filled 2022-08-20 (×3): qty 1

## 2022-08-20 MED ORDER — TICAGRELOR 90 MG PO TABS
90.0000 mg | ORAL_TABLET | Freq: Two times a day (BID) | ORAL | Status: DC
  Administered 2022-08-20 – 2022-08-23 (×4): 90 mg
  Filled 2022-08-20 (×5): qty 1

## 2022-08-20 MED ORDER — ALBUTEROL SULFATE (2.5 MG/3ML) 0.083% IN NEBU: 2.5000 mg | INHALATION_SOLUTION | Freq: Four times a day (QID) | RESPIRATORY_TRACT | Status: DC | PRN

## 2022-08-20 MED ORDER — MAGNESIUM SULFATE 2 GM/50ML IV SOLN
2.0000 g | INTRAVENOUS | Status: AC
  Administered 2022-08-20 (×2): 2 g via INTRAVENOUS
  Filled 2022-08-20 (×2): qty 50

## 2022-08-20 MED ORDER — METOPROLOL TARTRATE 5 MG/5ML IV SOLN
2.5000 mg | INTRAVENOUS | Status: DC | PRN
  Administered 2022-08-20: 2.5 mg via INTRAVENOUS
  Filled 2022-08-20 (×2): qty 5

## 2022-08-20 NOTE — Progress Notes (Signed)
I called her daughter Lauralyn Primes to answer questions. She has requested to have respiratory devices she can blow into to help reopen her lungs and she wants to try to work with her tomorrow using these. IS & flutter ordered.   Julian Hy, DO 08/20/22 4:20 PM  Pulmonary & Critical Care

## 2022-08-20 NOTE — Progress Notes (Addendum)
NAME:  Cindy Brooks, MRN:  [id number], DOB:  [birth year], LOS: 5 ADMISSION DATE:  08/14/2022, CONSULTATION DATE:  11/6 REFERRING MD:  Dr. Hanley Ben, CHIEF COMPLAINT:  Upper airway noise, cough    History of Present Illness:  51 y/o F who presented to [facility] ER on 11/1 with reports of cough & altered mental status.    At baseline, she is non-verbal and bed bound in the setting of prior CVA. She had a two month hospitalization in April 2023 due to CVA - bilateral thalamic, midbrain and bilateral cerebral infarctions. This resulted in trach / PEG.  She lives at home with her daughter [name].  Her daughter reports she does speak some.  Her grand kids sit with her & watch TV.  She is able to pat hands for yes/no, some very limited verbal interaction but not really any words. She has been working with PT but they stopped coming, OT is still coming to the house.  Working on trying to get her to set up.  Stays in bed most of the day. Son in law will pick her up and put her in the recliner during the day. Daughter [name] works full time, cares for her mother and her 4 children.  Reports she does eat at home. She reports she noted increased gurgling / cough and that prompted admission.  Initial work up notable for negative COVID & influenza screening. Blood cultures negative. UA with 20 ketones, 30 protein, rare bacteria, 11-20 WBC. CXR notable small left pleural effusion and retrocardiac atelectasis vs consolidation. CTA chest negative for PE, mucoid debris noted in the left sided airways with near complete post-obstructive collapse of the LLL and partial post obstructive collapse of the LUL with significant volume loss and leftward mediastinal shift. CT head negative for acute process.  The patient was admitted per [facility] with concern for PNA and treated with empiric abx.  She has not had fever or leukocytosis. Not requiring oxygen.   PCCM consulted 11/6 for evaluation of LLL findings.    Pertinent  Medical History   Prior ETOH  Respiratory Failure s/p Trach  Recurrent UTI  Significant Hospital Events: Including procedures, antibiotic start and stop dates in addition to other pertinent events   11/1 Admit with "gurgling", cough, admitted for PNA 11/6 PCCM consulted for pulmonary evaluation   Interim History / Subjective:  Appears to be improving with aggressive pulmonary toilet Objective   Blood pressure (!) 122/103, pulse (!) 110, temperature 98.4 F (36.9 C), temperature source Axillary, resp. rate 18, height 5\' 4"  (1.626 m), weight 48 kg, SpO2 97 %.        Intake/Output Summary (Last 24 hours) at 08/20/2022 0901 Last data filed at 08/20/2022 0530 Gross per 24 hour  Intake 4213.58 ml  Output 1400 ml  Net 2813.58 ml   Filed Weights   08/15/22 1824 08/20/22 0500  Weight: 48.8 kg 48 kg    Examination: General: 9 interactive female HEENT: MM pink/moist no JVD or lymphadenopathy Neuro: Does not follow commands withdraws CV: Heart sounds are regular PULM: Coarse rhonchi decreased in the bases  GI: soft, bsx4 active  Extremities: warm/dry,  edema  Skin: no rashes or lesions     Resolved Hospital Problem list      Assessment & Plan:   LLL, LUL Mucus Plugging with Atelectasis  Hx CVA  Hx CVA in April 2023, largely bed bound since April.  She lays predominately to the left in bed (as evidence by dependent left sided  facial edema).  Suspect with eating at home, that she may have had aspiration event(s). She does not have elevated WBC, fever to suggest evolving PNA despite changes in LLL.  Not requiring O2.   Agree with empirical antibiotics Aggressive pulmonary hygiene with chest PT every 4 hours Mucolytic's Hypertonic saline nebs x3 days Serial chest x-rays chest x-ray 08/20/2022 shows no acute Most likely needs to be n.p.o.    Hypophosphatemia  Per primary  Acute Encephalopathy  Rule out other causes beyond pulmonary, no other significant metabolic derangements  Per  primary  Best Practice (right click and "Reselect all SmartList Selections" daily)  Per TR[facility]   Labs   CBC: Recent Labs  Lab 08/15/22 0035 08/15/22 0849 08/16/22 0641 08/17/22 0407 08/18/22 0336 08/19/22 0503 08/20/22 0759  WBC 7.1   < > 7.9 5.8 6.3 6.0 6.3  NEUTROABS 4.7  --   --  3.8 4.2 3.9 3.5  HGB 10.6*   < > 7.9* 8.4* 8.2* 7.1* 8.5*  HCT 32.5*   < > 24.0* 24.9* 24.7* 21.4* 26.2*  MCV 86.4   < > 84.2 81.4 83.2 83.9 83.4  PLT 304   < > 187 198 191 PLATELET CLUMPS NOTED ON SMEAR, UNABLE TO ESTIMATE 183   < > = values in this interval not displayed.    Basic Metabolic Panel: Recent Labs  Lab 08/17/22 1029 08/17/22 1519 08/18/22 0336 08/19/22 0341 08/20/22 0426  NA 140 143 143 141 140  K 2.3* 2.6* 4.0 4.3 3.6  CL 92* 98 104 110 108  CO2 37* 32 26 23 25   GLUCOSE 426* 105* 76 109* 81  BUN <5* 6 5* <5* <5*  CREATININE 0.75 0.77 0.74 0.79 0.75  CALCIUM 7.4* 8.5* 8.4* 8.4* 8.4*  MG 1.4* 1.6* 2.3 2.0 1.6*  PHOS  --   --   --  2.0*  --    GFR: Estimated Creatinine Clearance: 63 mL/min (by C-G formula based on SCr of 0.75 mg/dL). Recent Labs  Lab 08/15/22 0005 08/15/22 0035 08/15/22 0425 08/15/22 0849 08/17/22 0407 08/18/22 0336 08/19/22 0503 08/19/22 1545 08/20/22 0426 08/20/22 0759  PROCALCITON  --   --   --   --   --   --   --  <0.10 <0.10  --   WBC  --    < >  --    < > 5.8 6.3 6.0  --   --  6.3  LATICACIDVEN 2.3*  --  1.4  --   --   --   --   --   --   --    < > = values in this interval not displayed.    Liver Function Tests: Recent Labs  Lab 08/15/22 0242 08/17/22 1029  AST 16 14*  ALT 9 7  ALKPHOS 60 58  BILITOT 1.0 0.6  PROT 5.6* 4.7*  ALBUMIN 2.9* 2.1*   No results for input(s): "LIPASE", "AMYLASE" in the last 168 hours. Recent Labs  Lab 08/17/22 0407  AMMONIA 18    ABG    Component Value Date/Time   PHART 7.47 (H) 02/26/2022 1725   PCO2ART 41 02/26/2022 1725   PO2ART 73 (L) 02/26/2022 1725   HCO3 29.8 (H) 02/26/2022 1725    TCO2 25 08/04/2022 1703   ACIDBASEDEF 2.0 01/21/2022 0354   O2SAT 95.5 02/26/2022 1725     Coagulation Profile: Recent Labs  Lab 08/15/22 0035  INR 1.1    Cardiac Enzymes: No results for input(s): "CKTOTAL", "CKMB", "CKMBINDEX", "  TROPONINI" in the last 168 hours.  HbA1C: Hgb A1c MFr Bld  Date/Time Value Ref Range Status  01/22/2022 03:15 AM 5.7 (H) 4.8 - 5.6 % Final    Comment:    (NOTE) Pre diabetes:          5.7%-6.4%  Diabetes:              >6.4%  Glycemic control for   <7.0% adults with diabetes     CBG: Recent Labs  Lab 08/19/22 1616 08/19/22 2024 08/19/22 2333 08/20/22 0322 08/20/22 0823  GLUCAP 123* 122* 103* 83 80     Critical care time: n/a     Richardson Landry Minor ACNP Acute Care Nurse Practitioner [place] Please consult [place] 08/20/2022, 9:02 AM    Attending attestation: Ms. Mealing is a 51 y/o woman with a history of stroke, prolonged respiratory failure requiring tracheostomy earlier in 2023 who presented with acute respiratory failure, cough, and was found to have pneumonia. PCCM was consulted for cough.  BP (!) 128/97 (BP Location: Right Arm)   Pulse (!) 102   Temp 97.8 F (36.6 C) (Axillary)   Resp (!) 22   Ht 5\' 4"  (1.626 m)   Wt 48 kg   LMP  (LMP Unknown)   SpO2 97%   BMI 18.16 kg/m   Chronically ill appearing woman lying in bed in NAD Sleeping, slightly arousable with stimulation Breathing comfortably on RA, reduced basilar breath sounds. No rhonchi.  S1S2, RRR Abd soft, NT No significant peripheral edema Skin warm, dry  WBC 6.3 H/H [id number] CXR personally reviewed> silhouetting of left lateral hemidiaphragm. Cardiomegaly.   Assessment & plan: Mucus plugging and atelectasis-  strong concern for lack of protective airway reflexes and at risk for aspiration despite dysphagia diet. Acute respiratory failure with hypoxia, resolved> now on RA -CPT, hypertonic sailne nebs -unfortunately correcting  atelectasis usually requires a somewhat active process of not only removing mucus plugs but actively re-expanding that lung. If her mental status remains poor and she remains week and in bed, this may be hard to correct. -agree with empiric antibiotics; 5 day course is appropriate  Subacute R frontal CVA, likely contributes to her encephalopathy with such a severely abnormal brain at baseline -appreciate neurology's management  Hypomagnesemia -repleted  Please call with questions.  Julian Hy, DO 08/20/22 3:35 PM [place] [facility] Critical [facility]

## 2022-08-20 NOTE — Progress Notes (Addendum)
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  On presentation, she was tachycardic with lactic acid of 2.3, WBC of 7.1, sodium 149.  Chest x-ray showed unchanged cardiomegaly with small pleural effusion.  UA was concerning for UTI.  COVID-19 and influenza test were negative.  She was started on IV fluids and antibiotics.  Palliative care was consulted.  Subsequently, pulmonary and neurology have been consulted as well.  Overall condition not improving.  Assessment & Plan:   Possible community-acquired bacterial pneumonia Mucoid debris in the left-sided airways with postobstructive collapse of left upper and lower lobes -Presented with cough.  CTA chest was negative for PE but showed mucoid debris in the left sided airways with left upper lobe and left lower lobe collapse.  Discussed with pulmonary via secure chat on 08/15/2022 who recommended aggressive chest physiotherapy.   -Initially started on broad-spectrum antibiotics.  DC'd vancomycin on 08/16/2022.  Continue cefepime and Zithromax.  Will finish 7-day course of therapy.  Today is day #7 -Procalcitonin today is less than 0.1 -COVID-19 and influenza testing negative on presentation.  Cultures negative so far. -Family on phone on 08/19/2022 was very concerned about the outcome and follow-up of lung collapse.  Pulmonary was consulted for the same.  Severe sepsis: Present on admission -Possibly from pneumonia and UTI -Hemodynamically improving.  Blood cultures negative so far.  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 -Continue antibiotics as above.  Outpatient follow-up with urology.  Acute metabolic encephalopathy Prior CVA, unspecified with prior residual deficits Goals of care -Patient presented with worsening mental status changes.  Almost nonverbal at baseline and mostly bedbound due to prior stroke. -Mental status still not improving.  Still hardly responsive.   -Gabapentin on hold.  Cymbalta on hold -Palliative care following.  Overall prognosis is poor.  Patient remains full code. -SLP following: Recommends n.p.o. -NGT placed on 08/17/2022: NG feeding has already been started.  Patient apparently pulled out NG tube overnight which has been subsequently replaced. -Follow PT recommendations -CT of the head without contrast was negative for any acute intracranial abnormity on 08/16/2022.  EEG negative for seizures. -B12, ammonia and TSH levels normal.  Continue empiric folic acid  -Family on phone on 08/19/2022 was very concerned about patient's mental status not improving.  Ordered MRI and consulted neurology. -MRI of brain without contrast showed subacute infarct in the right frontal lobe; no acute infarct or hemorrhage.  Hyponatremia -Resolved  Hypernatremia -Possibly from dehydration.  Resolved.  Acute metabolic acidosis -Treated with bicarb drip.  Resolved.  Off bicarb drip.  Hypokalemia -Improved  Hypomagnesemia -Improved  Anemia of chronic disease -From chronic illnesses.  Hemoglobin slightly drifting down, 7.1 on 08/19/2022.  Labs pending for today.  No signs of bleeding.  Iron profile does not indicate iron deficiency anemia.  Folate/vitamin B12/TSH levels normal as mentioned above.  Stage II right buttock pressure ulcer: Present on admission -Continue local wound care    DVT prophylaxis: Lovenox on hold from 08/19/2022 because of drop in hemoglobin Code Status: Full Family Communication: Spoke to daughters on phone on 08/19/2022 Disposition Plan: Status is:  Inpatient Remains inpatient appropriate because:  Of severity of illness    Consultants: Palliative care/pulmonary/neurology  Procedures: EEG  Antimicrobials:  Anti-infectives (From admission, onward)    Start     Dose/Rate Route Frequency Ordered Stop   08/19/22 1600  ceFEPIme (MAXIPIME) 1 g in sodium chloride 0.9 % 100 mL IVPB        1 g 200 mL/hr over 30 Minutes Intravenous Every 8 hours 08/19/22 1438 08/20/22 2359   08/16/22 2300  vancomycin (VANCOREADY) IVPB 1250 mg/250 mL  Status:  Discontinued        1,250 mg 166.7 mL/hr over 90 Minutes Intravenous Every 24 hours 08/15/22 1157 08/15/22 1157   08/16/22 1800  ceFEPIme (MAXIPIME) 1 g in sodium chloride 0.9 % 100 mL IVPB  Status:  Discontinued        1 g 200 mL/hr over 30 Minutes Intravenous 2 times daily 08/16/22 1514 08/19/22 1438   08/15/22 2300  vancomycin (VANCOREADY) IVPB 1250 mg/250 mL  Status:  Discontinued        1,250 mg 166.7 mL/hr over 90 Minutes Intravenous Every 24 hours 08/15/22 1157 08/16/22 1121   08/15/22 1100  azithromycin (ZITHROMAX) 500 mg in sodium chloride 0.9 % 250 mL IVPB        500 mg 250 mL/hr over 60 Minutes Intravenous Every 24 hours 08/15/22 1049     08/15/22 1000  vancomycin (VANCOREADY) IVPB 750 mg/150 mL  Status:  Discontinued        750 mg 150 mL/hr over 60 Minutes Intravenous Every 12 hours 08/15/22 0533 08/15/22 1157   08/15/22 0800  ceFEPIme (MAXIPIME) 2 g in sodium chloride 0.9 % 100 mL IVPB  Status:  Discontinued        2 g 200 mL/hr over 30 Minutes Intravenous Every 8 hours 08/15/22 0528 08/16/22 1514   08/14/22 2345  aztreonam (AZACTAM) 2 g in sodium chloride 0.9 % 100 mL IVPB  Status:  Discontinued        2 g 200 mL/hr over 30 Minutes Intravenous  Once 08/14/22 2331 08/14/22 2334   08/14/22 2345  metroNIDAZOLE (FLAGYL) IVPB 500 mg        500 mg 100 mL/hr over 60 Minutes Intravenous  Once 08/14/22 2331 08/15/22 0247   08/14/22 2345  vancomycin (VANCOCIN) IVPB 1000 mg/200 mL premix   Status:  Discontinued        1,000 mg 200 mL/hr over 60 Minutes Intravenous  Once 08/14/22 2331 08/14/22 2334   08/14/22 2345  ceFEPIme (MAXIPIME) 2 g in sodium chloride 0.9 % 100 mL IVPB        2 g 200 mL/hr over 30 Minutes Intravenous  Once 08/14/22 2334 08/15/22 0102   08/14/22 2345  vancomycin (VANCOREADY) IVPB 1250 mg/250 mL        1,250 mg 166.7 mL/hr over 90 Minutes Intravenous  Once 08/14/22 2334 08/15/22 0316        Subjective: Patient seen and examined at bedside.  No seizures, vomiting, or fever reported.  Patient apparently pulled out her NG tube overnight which was replaced by nursing staff.   Objective: Vitals:   08/20/22 0105 08/20/22 0400 08/20/22 0500 08/20/22 0804  BP: (!) 128/92 107/85    Pulse:  (!) 107  (!) 110  Resp: (!) 24 18  18   Temp: 99 F (37.2 C) 99.4 F (37.4 C)    TempSrc: Axillary Axillary    SpO2: 99% 97%  97%  Weight:   48 kg   Height:  Intake/Output Summary (Last 24 hours) at 08/20/2022 0819 Last data filed at 08/20/2022 0530 Gross per 24 hour  Intake 4213.58 ml  Output 1400 ml  Net 2813.58 ml    Filed Weights   08/15/22 1824 08/20/22 0500  Weight: 48.8 kg 48 kg    Examination:  General: No acute distress.  Still on room air.  Looks chronically ill and deconditioned. ENT/neck: NG tube present. respiratory: Bilateral decreased breath sounds at bases with scattered crackles CVS: Intermittent tachycardia present; S1-S2 heard  abdominal: Soft, nontender, still showing signs of distention; no organomegaly, normal bowel sounds heard extremities: No clubbing; mild lower extremity edema present  CNS: Extremely drowsy, wakes up only very minimally and does not follow any commands.   Lymph: No cervical lymphadenopathy palpable skin: No obvious petechiae/rashes  psych: Still cannot assess because of mental status.  Currently not agitated.  Musculoskeletal: No obvious joint erythema/tenderness/swelling    Data Reviewed: I have  personally reviewed following labs and imaging studies  CBC: Recent Labs  Lab 08/15/22 0035 08/15/22 0849 08/16/22 0641 08/17/22 0407 08/18/22 0336 08/19/22 0503  WBC 7.1 8.3 7.9 5.8 6.3 6.0  NEUTROABS 4.7  --   --  3.8 4.2 3.9  HGB 10.6* 8.4* 7.9* 8.4* 8.2* 7.1*  HCT 32.5* 25.5* 24.0* 24.9* 24.7* 21.4*  MCV 86.4 85.6 84.2 81.4 83.2 83.9  PLT 304 217 187 198 191 PLATELET CLUMPS NOTED ON SMEAR, UNABLE TO ESTIMATE    Basic Metabolic Panel: Recent Labs  Lab 08/17/22 1029 08/17/22 1519 08/18/22 0336 08/19/22 0341 08/20/22 0426  NA 140 143 143 141 140  K 2.3* 2.6* 4.0 4.3 3.6  CL 92* 98 104 110 108  CO2 37* 32 26 23 25   GLUCOSE 426* 105* 76 109* 81  BUN <5* 6 5* <5* <5*  CREATININE 0.75 0.77 0.74 0.79 0.75  CALCIUM 7.4* 8.5* 8.4* 8.4* 8.4*  MG 1.4* 1.6* 2.3 2.0 1.6*  PHOS  --   --   --  2.0*  --     GFR: Estimated Creatinine Clearance: 63 mL/min (by C-G formula based on SCr of 0.75 mg/dL). Liver Function Tests: Recent Labs  Lab 08/15/22 0242 08/17/22 1029  AST 16 14*  ALT 9 7  ALKPHOS 60 58  BILITOT 1.0 0.6  PROT 5.6* 4.7*  ALBUMIN 2.9* 2.1*    No results for input(s): "LIPASE", "AMYLASE" in the last 168 hours. Recent Labs  Lab 08/17/22 0407  AMMONIA 18    Coagulation Profile: Recent Labs  Lab 08/15/22 0035  INR 1.1    Cardiac Enzymes: No results for input(s): "CKTOTAL", "CKMB", "CKMBINDEX", "TROPONINI" in the last 168 hours. BNP (last 3 results) No results for input(s): "PROBNP" in the last 8760 hours. HbA1C: No results for input(s): "HGBA1C" in the last 72 hours. CBG: Recent Labs  Lab 08/19/22 1142 08/19/22 1616 08/19/22 2024 08/19/22 2333 08/20/22 0322  GLUCAP 116* 123* 122* 103* 83    Lipid Profile: No results for input(s): "CHOL", "HDL", "LDLCALC", "TRIG", "CHOLHDL", "LDLDIRECT" in the last 72 hours. Thyroid Function Tests: Recent Labs    08/19/22 2046  TSH 2.174    Anemia Panel: Recent Labs    08/19/22 2046  08/20/22 0426  VITAMINB12 828  --   FOLATE 10.1  --   FERRITIN  --  802*  TIBC  --  101*  IRON  --  34    Sepsis Labs: Recent Labs  Lab 08/15/22 0005 08/15/22 0425 08/19/22 1545 08/20/22 0426  PROCALCITON  --   --  <  0.10 <0.10  LATICACIDVEN 2.3* 1.4  --   --      Recent Results (from the past 240 hour(s))  Resp Panel by RT-PCR (Flu A&B, Covid) Anterior Nasal Swab     Status: None   Collection Time: 08/15/22 12:05 AM   Specimen: Anterior Nasal Swab  Result Value Ref Range Status   SARS Coronavirus 2 by RT PCR NEGATIVE NEGATIVE Final    Comment: (NOTE) SARS-CoV-2 target nucleic acids are NOT DETECTED.  The SARS-CoV-2 RNA is generally detectable in upper respiratory specimens during the acute phase of infection. The lowest concentration of SARS-CoV-2 viral copies this assay can detect is 138 copies/mL. A negative result does not preclude SARS-Cov-2 infection and should not be used as the sole basis for treatment or other patient management decisions. A negative result may occur with  improper specimen collection/handling, submission of specimen other than nasopharyngeal swab, presence of viral mutation(s) within the areas targeted by this assay, and inadequate number of viral copies(<138 copies/mL). A negative result must be combined with clinical observations, patient history, and epidemiological information. The expected result is Negative.  Fact Sheet for Patients:  BloggerCourse.com  Fact Sheet for Healthcare Providers:  SeriousBroker.it  This test is no t yet approved or cleared by the [place] FDA and  has been authorized for detection and/or diagnosis of SARS-CoV-2 by FDA under an Emergency Use Authorization (EUA). This EUA will remain  in effect (meaning this test can be used) for the duration of the COVID-19 declaration under Section 564(b)(1) of the Act, 21 U.S.C.section 360bbb-3(b)(1), unless the  authorization is terminated  or revoked sooner.       Influenza A by PCR NEGATIVE NEGATIVE Final   Influenza B by PCR NEGATIVE NEGATIVE Final    Comment: (NOTE) The Xpert Xpress SARS-CoV-2/FLU/RSV plus assay is intended as an aid in the diagnosis of influenza from Nasopharyngeal swab specimens and should not be used as a sole basis for treatment. Nasal washings and aspirates are unacceptable for Xpert Xpress SARS-CoV-2/FLU/RSV testing.  Fact Sheet for Patients: BloggerCourse.com  Fact Sheet for Healthcare Providers: SeriousBroker.it  This test is not yet approved or cleared by the [place] FDA and has been authorized for detection and/or diagnosis of SARS-CoV-2 by FDA under an Emergency Use Authorization (EUA). This EUA will remain in effect (meaning this test can be used) for the duration of the COVID-19 declaration under Section 564(b)(1) of the Act, 21 U.S.C. section 360bbb-3(b)(1), unless the authorization is terminated or revoked.  Performed at [facility], [address], [place], [place] [address]   Blood Culture (routine x 2)     Status: None   Collection Time: 08/15/22 12:08 AM   Specimen: BLOOD RIGHT HAND  Result Value Ref Range Status   Specimen Description BLOOD RIGHT HAND  Final   Special Requests   Final    BOTTLES DRAWN AEROBIC AND ANAEROBIC Blood Culture adequate volume   Culture   Final    NO GROWTH 5 DAYS Performed at [facility], [address], [place], [place] [address]    Report Status 08/20/2022 FINAL  Final  Blood Culture (routine x 2)     Status: None   Collection Time: 08/15/22 12:40 AM   Specimen: BLOOD LEFT HAND  Result Value Ref Range Status   Specimen Description BLOOD LEFT HAND  Final   Special Requests   Final    BOTTLES DRAWN AEROBIC AND ANAEROBIC Blood Culture adequate volume   Culture  Final    NO GROWTH 5 DAYS Performed at [facility], [address] [place], [place] [address]    Report Status 08/20/2022 FINAL  Final         Radiology Studies: MR BRAIN WO CONTRAST  Result Date: 08/20/2022 CLINICAL DATA:  Delirium EXAM: MRI HEAD WITHOUT CONTRAST TECHNIQUE: Multiplanar, multiecho pulse sequences of the brain and surrounding structures were obtained without intravenous contrast. COMPARISON:  03/23/2022 FINDINGS: Brain: Increased signal on diffusion-weighted imaging in the right corona radiata (series 9, image 87), without ADC correlate, likely subacute infarcts with normalization of the ADC. These areas are new compared to 03/23/2022. Additional more punctate foci of increased signal on diffusion-weighted imaging in the right frontal lobe, without ADC correlates (series 9, image 92 and 93), are also favored to represent subacute infarcts. No acute hemorrhage, mass, mass effect, or midline shift. No hydrocephalus or extra-axial collection. 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 Remote lacunar infarcts in the bilateral basal ganglia, bilateral thalami, and right corona radiata T2 hyperintense signal in the periventricular white matter, likely the sequela of mild-to-moderate chronic small vessel ischemic disease. Vascular: Normal arterial flow voids. Skull and upper cervical spine: Normal marrow signal. Sinuses/Orbits: Dysconjugate gaze. Mucosal thickening in the left maxillary sinus. Other: Trace fluid in left mastoid air cells. IMPRESSION: Subacute infarcts in the right frontal lobe. No acute infarct or hemorrhage. Electronically Signed   By: Wiliam KeAlison  Vasan M.D.   On: 08/20/2022 03:10        Scheduled Meds:  amantadine  100 mg Per Tube BID   atorvastatin  80 mg Per Tube Daily   Chlorhexidine Gluconate Cloth  6 each Topical Daily   clopidogrel  75 mg Per Tube Daily   folic acid  1 mg Per Tube Daily   free water  100 mL Per Tube Q6H   guaiFENesin  10  mL Per Tube BID   multivitamin  15 mL Per Tube Daily   sodium chloride HYPERTONIC  4 mL Nebulization BID   thiamine (VITAMIN B1) injection  100 mg Intravenous Daily   Continuous Infusions:  azithromycin 500 mg (08/19/22 1045)   ceFEPime (MAXIPIME) IV 1 g (08/20/22 0525)   feeding supplement (OSMOLITE 1.2 CAL) 40 mL/hr at 08/19/22 2000          Glade LloydKshitiz Saxon Barich, MD [facility] 08/20/2022, 8:19 AM

## 2022-08-20 NOTE — Progress Notes (Signed)
Pt pulled out NG tube last night.  NG tube replaced, awaiting xray confirmation.  Soft mitts placed on hands as pt was reaching towards tube upon reinsertion.  Education provided.

## 2022-08-20 NOTE — Progress Notes (Signed)
Daily Progress Note   Patient Name: Cindy Brooks       Date: 08/20/2022 DOB: 1970/12/22  Age: 51 y.o. MRN#: [id number] Attending Physician: Aline August, MD Primary Care Physician: Charlott Rakes, MD Admit Date: 08/14/2022  Reason for Consultation/Follow-up: Establishing goals of care  Subjective: Medical records reviewed including progress notes, labs, imaging. Patient assessed at the bedside.  She remains minimally responsive to voice or touch.  No family present during my visit.  I called patient's daughter [name] to provide ongoing palliative support.  She anticipates an update from pulmonology later today and tells me her sister was able to sit with the patient last night.  She has no other concerns or questions, goals remain clear for aggressive care.  PMT will continue to support holistically.   Length of Stay: 5  Physical Exam Vitals and nursing note reviewed.  Constitutional:      General: She is not in acute distress.    Appearance: She is ill-appearing.  Cardiovascular:     Rate and Rhythm: Tachycardia present.  Pulmonary:     Effort: Pulmonary effort is normal.  Skin:    General: Skin is cool and dry.  Neurological:     Mental Status: She is unresponsive.           Vital Signs: BP (!) 128/97 (BP Location: Right Arm)   Pulse (!) 127   Temp 97.8 F (36.6 C) (Axillary)   Resp (!) 21   Ht 5\' 4"  (1.626 m)   Wt 48 kg   LMP  (LMP Unknown)   SpO2 97%   BMI 18.16 kg/m  SpO2: SpO2: 97 % O2 Device: O2 Device: Room Air O2 Flow Rate:        Palliative Assessment/Data: 10%   Palliative Care Assessment & Plan   Patient Profile: 51 y.o. female  with past medical history of recurrent UTI, CVA with deficits, depression, chronic pain admitted on 08/14/2022 with cough  and altered mental status.    Patient is admitted for severe sepsis due to community-acquired pneumonia, UTI.  PMT has been consulted to assist with goals of care conversation.   Assessment: Goals of care conversation Recurrent UTI CVA with deficits Left lower lobe mucous plugging with atelectasis Acute metabolic encephalopathy Acute metabolic acidosis  Recommendations/Plan: Continue full code/full scope treatment Patient continues to  have poor mental status with little to no improvement, appropriate for comfort care and hospice however family is not ready at this time Psychosocial and emotional support provided PMT will continue to follow and support   Prognosis: Poor  Discharge Planning: To Be Determined  Care plan was discussed with patient's daughter     MDM high         Amos Micheals Johnnette Litter, PA-C  Palliative Medicine Team Team phone # [phone]  Thank you for allowing the Palliative Medicine Team to assist in the care of this patient. Please utilize secure chat with additional questions, if there is no response within 30 minutes please call the above phone number.  Palliative Medicine Team providers are available by phone from 7am to 7pm daily and can be reached through the team cell phone.  Should this patient require assistance outside of these hours, please call the patient's attending physician.

## 2022-08-20 NOTE — Progress Notes (Signed)
SUBJECTIVE (INTERVAL HISTORY)     Cindy Brooks is an 51 y.o. 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  Family had noticed functional decline in the last few days as an MRI scan was obtained during the current admission which shows tiny punctate right frontal cortical and subcortical infarcts.  Patient was on Plavix daily for stroke prevention. Echocardiogram on 05/27/2022 had shown ejection fraction greater than 75% with hyperdynamic function.  OBJECTIVE Vitals:   08/20/22 1000 08/20/22 1200 08/20/22 1400 08/20/22 1541  BP:  (!) 128/97  112/83  Pulse: (!) 102 (!) 127 (!) 102 98  Resp: 19 (!) 21 (!) 22 (!) 21  Temp:  97.8 F (36.6 C)  98.5 F (36.9 C)  TempSrc:  Axillary  Axillary  SpO2: 98% 97% 97% 97%  Weight:      Height:        CBC:  Recent Labs  Lab 08/19/22 0503 08/20/22 0759  WBC 6.0 6.3  NEUTROABS 3.9 3.5  HGB 7.1* 8.5*  HCT 21.4* 26.2*  MCV 83.9 83.4  PLT PLATELET CLUMPS NOTED ON SMEAR, UNABLE TO ESTIMATE 989    Basic Metabolic Panel:  Recent Labs  Lab 08/19/22 0341 08/20/22 0426  NA 141 140  K 4.3 3.6  CL 110 108  CO2 23 25  GLUCOSE 109* 81  BUN <5* <5*  CREATININE 0.79 0.75  CALCIUM 8.4* 8.4*  MG 2.0 1.6*  PHOS 2.0*  --     Urine Drug Screen:     Component Value Date/Time   LABOPIA NONE DETECTED 03/23/2022 2205   COCAINSCRNUR NONE DETECTED 03/23/2022 2205   LABBENZ NONE DETECTED 03/23/2022 2205   AMPHETMU NONE DETECTED 03/23/2022 2205   THCU NONE DETECTED  03/23/2022 2205   LABBARB NONE DETECTED 03/23/2022 2205    Alcohol Level     Component Value Date/Time   ETH <10 01/20/2022 0249    IMAGING  CT head 08/16/2022 no acute abnormality.  Chronic changes of small vessel disease and remote lacunar infarct left basal ganglia. MRI head tiny punctate right frontal cortical and subcortical acute infarcts. LDL pending HgbA1c pending Neurovascular imaging not done  EEG 08/17/22 generalized nonspecific cerebral dysfunction consistent with encephalopathy.  No seizures      PHYSICAL EXAM Frail malnourished looking middle-aged African-American lady not in distress lying comfortably in bed.   Neurological Exam : awake with eyes open, nonverbal but tries to speak however, she is able to follow most simple commands, such as open/close eyes, showing fingers and wiggle toes. Chronic right 1-1/2 syndrome left eye lateral gaze, right eye INO, bilateral pupils 71mm fixed, left ptosis.  Left eye ptosis.  Right mild facial droop, blinking to visual threat bilaterally.  Able to protrude tongue.  Bilateral UEs 3/5, no drift on the right. RLE 3-/5 and LLE 2-/5. Sensation, coordination not cooperative and gait not tested.   ASSESSMENT [name] is a 51 y.o. 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  She had recent functional decline in the condition prompting hospitalization which has shown UTI and pneumonia with MRI scan showing 2 tiny punctate right frontal infarcts  Stroke right frontal tiny cortical and subcortical infarcts Resultant no new deficits on top of her severe baseline neurological deficits Recommend change Plavix to aspirin and Brilinta for 4 weeks followed by aspirin alone. Check lipid profile and hemoglobin A1c. Continue ongoing  therapies. DVT prophylaxis. Long discussion over the phone with the patient's 2 daughters about her neurological disability and results of MRI scan and discuss changing antiplatelet therapy. Will not pursue further neuroimaging studies and repeat echocardiogram and cardiac monitoring as results are unlikely to impact treatment changes and she had extensive work-up barely 6 months ago Discussed with Dr. Jaclynn Major I have spent a total of  50  minutes with the patient reviewing hospital notes,  test results, labs and examining the patient as well as establishing an assessment and plan that was discussed personally with the patient.  > 50% of time was spent in direct patient care.     Delia Heady, MD Medical Director [facility] Pager: [phone] 08/20/2022 4:44 PM    To contact Stroke Continuity provider, please refer to [email or web address]. After hours, contact General Neurology

## 2022-08-21 ENCOUNTER — Inpatient Hospital Stay (HOSPITAL_COMMUNITY): Payer: No Typology Code available for payment source

## 2022-08-21 DIAGNOSIS — G9341 Metabolic encephalopathy: Secondary | ICD-10-CM | POA: Diagnosis not present

## 2022-08-21 DIAGNOSIS — T17998A Other foreign object in respiratory tract, part unspecified causing other injury, initial encounter: Secondary | ICD-10-CM

## 2022-08-21 DIAGNOSIS — E440 Moderate protein-calorie malnutrition: Secondary | ICD-10-CM | POA: Insufficient documentation

## 2022-08-21 DIAGNOSIS — J159 Unspecified bacterial pneumonia: Secondary | ICD-10-CM | POA: Diagnosis not present

## 2022-08-21 DIAGNOSIS — I63513 Cerebral infarction due to unspecified occlusion or stenosis of bilateral middle cerebral arteries: Secondary | ICD-10-CM | POA: Diagnosis not present

## 2022-08-21 DIAGNOSIS — R1310 Dysphagia, unspecified: Secondary | ICD-10-CM | POA: Diagnosis not present

## 2022-08-21 LAB — GLUCOSE, CAPILLARY
Glucose-Capillary: 117 mg/dL — ABNORMAL HIGH (ref 70–99)
Glucose-Capillary: 147 mg/dL — ABNORMAL HIGH (ref 70–99)
Glucose-Capillary: 137 mg/dL — ABNORMAL HIGH (ref 70–99)
Glucose-Capillary: 121 mg/dL — ABNORMAL HIGH (ref 70–99)
Glucose-Capillary: 87 mg/dL (ref 70–99)

## 2022-08-21 LAB — HEMOGLOBIN A1C
Hgb A1c MFr Bld: 4.6 % — ABNORMAL LOW (ref 4.8–5.6)
Mean Plasma Glucose: 85.32 mg/dL

## 2022-08-21 LAB — CBC WITH DIFFERENTIAL/PLATELET
Abs Immature Granulocytes: 0.03 10*3/uL (ref 0.00–0.07)
Basophils Absolute: 0 10*3/uL (ref 0.0–0.1)
Basophils Relative: 0 %
Eosinophils Absolute: 0.1 10*3/uL (ref 0.0–0.5)
Eosinophils Relative: 2 %
HCT: 22.4 % — ABNORMAL LOW (ref 36.0–46.0)
Hemoglobin: 7.3 g/dL — ABNORMAL LOW (ref 12.0–15.0)
Immature Granulocytes: 1 %
Lymphocytes Relative: 22 %
Lymphs Abs: 1.4 10*3/uL (ref 0.7–4.0)
MCH: 27.7 pg (ref 26.0–34.0)
MCHC: 32.6 g/dL (ref 30.0–36.0)
MCV: 84.8 fL (ref 80.0–100.0)
Monocytes Absolute: 0.8 10*3/uL (ref 0.1–1.0)
Monocytes Relative: 12 %
Neutro Abs: 4 10*3/uL (ref 1.7–7.7)
Neutrophils Relative %: 63 %
Platelets: 199 10*3/uL (ref 150–400)
RBC: 2.64 MIL/uL — ABNORMAL LOW (ref 3.87–5.11)
RDW: 16.1 % — ABNORMAL HIGH (ref 11.5–15.5)
WBC: 6.3 10*3/uL (ref 4.0–10.5)
nRBC: 0.3 % — ABNORMAL HIGH (ref 0.0–0.2)

## 2022-08-21 LAB — BASIC METABOLIC PANEL
Anion gap: 6 (ref 5–15)
BUN: 6 mg/dL (ref 6–20)
CO2: 24 mmol/L (ref 22–32)
Calcium: 8.1 mg/dL — ABNORMAL LOW (ref 8.9–10.3)
Chloride: 108 mmol/L (ref 98–111)
Creatinine, Ser: 0.74 mg/dL (ref 0.44–1.00)
GFR, Estimated: >60 mL/min (ref >60)
Glucose, Bld: 137 mg/dL — ABNORMAL HIGH (ref 70–99)
Potassium: 3.5 mmol/L (ref 3.5–5.1)
Sodium: 138 mmol/L (ref 135–145)

## 2022-08-21 LAB — LIPID PANEL
Cholesterol: 109 mg/dL (ref 0–200)
HDL: 22 mg/dL — ABNORMAL LOW (ref >40)
LDL Cholesterol: 68 mg/dL (ref 0–99)
Total CHOL/HDL Ratio: 5 RATIO
Triglycerides: 94 mg/dL (ref <150)
VLDL: 19 mg/dL (ref 0–40)

## 2022-08-21 LAB — PROCALCITONIN: Procalcitonin: <0.1 ng/mL

## 2022-08-21 LAB — MAGNESIUM: Magnesium: 2.6 mg/dL — ABNORMAL HIGH (ref 1.7–2.4)

## 2022-08-21 MED ORDER — SCOPOLAMINE 1 MG/3DAYS TD PT72
1.0000 | MEDICATED_PATCH | TRANSDERMAL | Status: DC
  Administered 2022-08-21 – 2022-09-02 (×5): 1.5 mg via TRANSDERMAL
  Filled 2022-08-21 (×5): qty 1

## 2022-08-21 MED ORDER — CAPSAICIN 0.025 % EX CREA
TOPICAL_CREAM | Freq: Two times a day (BID) | CUTANEOUS | Status: DC
  Filled 2022-08-21 (×3): qty 60

## 2022-08-21 MED ORDER — ACETAMINOPHEN 650 MG RE SUPP: 650.0000 mg | Freq: Four times a day (QID) | RECTAL | Status: DC | PRN

## 2022-08-21 MED ORDER — ACETAMINOPHEN 325 MG PO TABS
650.0000 mg | ORAL_TABLET | Freq: Four times a day (QID) | ORAL | Status: DC | PRN
  Administered 2022-08-21 – 2022-08-28 (×3): 650 mg
  Filled 2022-08-21 (×2): qty 2

## 2022-08-21 NOTE — Progress Notes (Signed)
Nutrition Follow-up  DOCUMENTATION CODES:   Non-severe (moderate) malnutrition in context of chronic illness  INTERVENTION:   Continue tube feeding via NG: Osmolite 1.2 at 60 ml/h (1440 ml per day) + 157m Q6H FWF Provides 1728 kcal, 80 gm protein, 1581 ml free water daily  - Monitor magnesium, potassium, and phosphorus, MD to replete as needed.  - Recommend check phosphorus due to low lab 11/6 and no new lab taken since that time  - Continue daily multivitamin, thiamine, and folic acid per MD - SLP following, will monitor for diet advancement   NUTRITION DIAGNOSIS:   Moderate Malnutrition related to chronic illness, social / environmental circumstances (bedbound and nonverbal after TIA) as evidenced by mild muscle depletion, mild fat depletion.  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 Patient presented to the ED due to cough and worsening mental status. CXR showed unchanged cardiomegaly with small pleural effusion. UA was concerning for UTI. She was started on IV fluids and antibiotics. Palliative Care was consulted.  Patient noted to be nonverbal and bed bound at baseline. No family or visitors at bedside at time of visit.  Pt discussed with RN. RN reports patient is tolerating her tube feeds well.  Last SLP eval 11/6 - pt recommended NPO. Possible repeat evaluation today.     Medications reviewed and include: Folic acid, multivitamin, Thiamine, Omsolite 1.2 FWF 1045mQ6H  Labs reviewed:  Magnesium 2.6 (H)   NUTRITION - FOCUSED PHYSICAL EXAM:  Flowsheet Row Most Recent Value   Orbital Region Mild depletion  Upper Arm Region Mild depletion  Thoracic and Lumbar Region Unable to assess  Buccal Region Mild depletion  Temple Region Mild depletion  Clavicle Bone Region Mild depletion  Clavicle and Acromion Bone Region Mild depletion  Scapular Bone Region Unable to assess  Dorsal Hand Unable to assess  [pt in mitt restraints]  Patellar Region Moderate depletion  [pt noted to be bedbound]  Anterior Thigh Region Moderate depletion  [pt noted to be bedbound]  Posterior Calf Region Severe depletion  [pt noted to bed bound]  Edema (RD Assessment) None  Hair Reviewed  Eyes Reviewed  Mouth Unable to assess  Skin Reviewed  Nails Unable to assess       Diet Order:   Diet Order             Diet NPO time specified  Diet effective now                   EDUCATION NEEDS:  Not appropriate for education at this time  Skin:  Skin Assessment: Skin Integrity Issues: Skin Integrity Issues:: Stage II Stage II: R buttocks  Last BM:  11/7  Height:  Ht Readings from Last 1 Encounters:  08/15/22 _0  (1.626 m)    Weight:  Wt Readings from Last 1 Encounters:  08/20/22 48 kg    Ideal Body Weight:  54.54 kg  BMI:  Body mass index is 18.16 kg/m.  Estimated Nutritional Needs:  Kcal:  1650-1850 kcal Protein:  80-95 grams Fluid:  >/= 1.8 L/day    AsSamson FredericD, LDN For contact  information, refer to [facility].

## 2022-08-21 NOTE — Progress Notes (Signed)
Speech Language Pathology Treatment: Dysphagia  Patient Details Name: Cindy Brooks MRN: [id number] DOB: [birth year] Today's Date: 08/21/2022 Time: 1425-1440 SLP Time Calculation (min) (ACUTE ONLY): 15 min  Assessment / Plan / Recommendation Clinical Impression  Pt seen at bedside for limited session focused on determining readiness for PO intake. Pt was more awake upon first visit by SLP, however, pt required nursing assistance. Upon return of SLP, pt was sitting upright in bed, chin down (practically on her chest), sleeping. SLP used verbal and gentle tactile stim to encourage better alertness. SLP provided oral care with suction. Pt would not open her jaw to allow cleaning of the lingual surface and back of her teeth. Following oral care, pt accepted individual ice chips x3, however, no laryngeal elevation was appreciated to palpation, and there was no audible swallow reflex. Pt allowed suctioning of the oral cavity after trials, with a minimal amount of clear liquid removed. Recommend continuing with NPO status. SLP will continue to follow acutely to assess readiness for PO intake.   HPI HPI: Cindy Brooks is a 51 y.o. female with medical history significant of recurrent UTI who presents emergency department due to cough and altered mental status.  Patient is nonverbal at baseline and bedbound due to prior stroke.  She had a two-month hospitalization in April of 2023 due to bilateral thalamic, midbrain, and bilateral cerebral infarctions. She had trach/PEG at that time and dysphagia and cognitive/language therapies. Chest CT reported "Mucoid debris in the left-sided airways with near-complete postobstructive collapse of the left lower lobe and partial postobstructive collapse of the left upper lobe, with significant associated volume loss and leftward mediastinal shift."  Most recent BSE on 03/25/22 with recommendations for Dysphagia 2 solids and thin liquids.      SLP Plan  Continue with current  plan of care      Recommendations for follow up therapy are one component of a multi-disciplinary discharge planning process, led by the attending physician.  Recommendations may be updated based on patient status, additional functional criteria and insurance authorization.    Recommendations  Diet recommendations: NPO Medication Administration: Via alternative means                Oral Care Recommendations: Oral care QID Follow Up Recommendations: Other (comment) (TBD) SLP Visit Diagnosis: Dysphagia, unspecified (R13.10) Plan: Continue with current plan of care        Cindy Brooks B. Murvin Natal, [facility], CCC-SLP Speech Language Pathologist Office: [phone]  Leigh Aurora 08/21/2022, 2:43 PM

## 2022-08-21 NOTE — Progress Notes (Signed)
SUBJECTIVE (INTERVAL HISTORY)     Patient is lying comfortably in bed respiratory therapist is giving chest percussion therapy.  No family member at bedside.  Neurological exam unchanged.  Vital signs stable.  LDL cholesterol 68 mg percent.  Hemoglobin A1c was 4.6 OBJECTIVE Vitals:   08/21/22 0400 08/21/22 0705 08/21/22 0741 08/21/22 1159  BP: 108/80  (!) 126/93 119/89  Pulse: 100 (!) 101 90 (!) 115  Resp: 19 16 17 18   Temp: 98.2 F (36.8 C)  99.9 F (37.7 C) 98.1 F (36.7 C)  TempSrc: Oral  Axillary Axillary  SpO2: 97% 99% 99% 98%  Weight:      Height:        CBC:  Recent Labs  Lab 08/20/22 0759 08/21/22 0543  WBC 6.3 6.3  NEUTROABS 3.5 4.0  HGB 8.5* 7.3*  HCT 26.2* 22.4*  MCV 83.4 84.8  PLT 183 199    Basic Metabolic Panel:  Recent Labs  Lab 08/19/22 0341 08/20/22 0426 08/21/22 0543  NA 141 140 138  K 4.3 3.6 3.5  CL 110 108 108  CO2 23 25 24   GLUCOSE 109* 81 137*  BUN <5* <5* 6  CREATININE 0.79 0.75 0.74  CALCIUM 8.4* 8.4* 8.1*  MG 2.0 1.6* 2.6*  PHOS 2.0*  --   --     Urine Drug Screen:     Component Value Date/Time   LABOPIA NONE DETECTED 03/23/2022 2205   COCAINSCRNUR NONE DETECTED 03/23/2022 2205   LABBENZ NONE DETECTED 03/23/2022 2205   AMPHETMU NONE DETECTED 03/23/2022 2205   THCU NONE DETECTED 03/23/2022 2205   LABBARB NONE DETECTED 03/23/2022 2205    Alcohol Level     Component Value Date/Time   ETH <10 01/20/2022 0249    IMAGING  CT head 08/16/2022 no acute abnormality.  Chronic changes of small vessel disease and remote lacunar infarct left basal ganglia. MRI head tiny punctate right frontal cortical and subcortical acute infarcts. LDL pending HgbA1c pending Neurovascular imaging not done  EEG 08/17/22 generalized nonspecific cerebral dysfunction consistent with encephalopathy.  No seizures      PHYSICAL EXAM Frail malnourished looking middle-aged African-American lady not in distress lying comfortably in bed.   Neurological  Exam : awake with eyes open, nonverbal but tries to speak however, she is able to follow most simple commands, such as open/close eyes, showing fingers and wiggle toes. Chronic right 1-1/2 syndrome left eye lateral gaze, right eye INO, bilateral pupils 45mm fixed, left ptosis.  Left eye ptosis.  Right mild facial droop, blinking to visual threat bilaterally.  Able to protrude tongue.  Bilateral UEs 3/5, no drift on the right. RLE 3-/5 and LLE 2-/5. Sensation, coordination not cooperative and gait not tested.   ASSESSMENT [name] is a 51 y.o. 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  She had recent functional decline in the condition prompting hospitalization which has shown UTI and pneumonia with MRI scan showing 2 tiny punctate right frontal infarcts  Stroke right frontal tiny cortical and subcortical infarcts Resultant no new deficits on top of her severe baseline neurological deficits Recommend   aspirin and Brilinta for 4 weeks followed by aspirin alone.  . Continue ongoing therapies. DVT prophylaxis. Long discussion over the phone with the patient's 2 daughters about her neurological disability and results of  MRI scan and discuss changing antiplatelet therapy. Will not pursue further neuroimaging studies and repeat echocardiogram and cardiac monitoring as results are unlikely to impact treatment changes and she had extensive work-up barely 6 months ago   I have spent a total of  25 minutes with the patient reviewing hospital notes,  test results, labs and examining the patient as well as establishing an assessment and plan that was discussed personally with the patient.  > 50% of time was spent in direct patient care. Stroke team will sign off.  Kindly call for questions    Delia Heady, MD Medical Director [facility] Pager: [phone] 08/21/2022 3:04 PM    To contact Stroke Continuity provider, please refer to [email or web address]. After hours, contact General Neurology

## 2022-08-21 NOTE — Progress Notes (Signed)
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  On presentation, she was tachycardic with lactic acid of 2.3, WBC of 7.1, sodium 149.  Chest x-ray showed unchanged cardiomegaly with small pleural effusion.  UA was concerning for UTI.  COVID-19 and influenza test were negative.  She was started on IV fluids and antibiotics.  Palliative care was consulted.  Subsequently, pulmonary and neurology have been consulted as well.  Overall condition not improving.  Assessment & Plan:   Possible community-acquired bacterial pneumonia Mucoid debris in the left-sided airways with postobstructive collapse of left upper and lower lobes -Presented with cough.  CTA chest was negative for PE but showed mucoid debris in the left sided airways with left upper lobe and left lower lobe collapse.  Discussed with pulmonary via secure chat on 08/15/2022 who recommended aggressive chest physiotherapy.   -Initially started on broad-spectrum antibiotics.  DC'd vancomycin on 08/16/2022.  Completed 7-day course of antibiotic therapy on 08/20/2022. -Procalcitonin was less than 0.1 on 08/20/2022 -COVID-19 and influenza testing negative on presentation.  Cultures negative so far. -Family on phone on 08/19/2022 was very concerned about the outcome and follow-up of lung collapse.  Pulmonary following. -Currently still on room air.  Severe sepsis: Present on admission -Possibly from pneumonia and UTI -Hemodynamically improving.  Blood cultures negative so far. -Sepsis has resolved.  UTI, possibly associated with Foley catheter Chronic urinary retention with chronic indwelling Foley catheter -Foley catheter apparently  changed within the last 2 weeks prior to presentation. -Completed antibiotics treatment as described above.  Outpatient follow-up with urology.  Acute metabolic encephalopathy Severe acute unspecified CVA/prior CVA, unspecified with prior residual deficits Goals of care -Patient presented with worsening mental status changes.  Almost nonverbal at baseline and mostly bedbound due to prior stroke. -Mental status still not improving.  Still hardly responsive.   -Gabapentin on hold.  Cymbalta on hold -Palliative care following.  Overall prognosis is poor.  Patient remains full code. -SLP following: Recommends n.p.o. -NGT placed on 08/17/2022: NG feeding has already been started.  Patient apparently pulled out NG tube once which has been subsequently replaced. -Follow PT recommendations -CT of the head without contrast was negative for any acute intracranial abnormity on 08/16/2022.  EEG negative for seizures. -B12, ammonia and TSH levels normal.  Continue empiric folic acid  -Family on phone on 08/19/2022 was very concerned about patient's mental status not improving.  Ordered MRI and consulted neurology. -MRI of brain without contrast on 08/20/2022 showed subacute infarct in the right frontal lobe; no acute infarct or hemorrhage. -Neurology recommending changing Plavix to aspirin and Brilinta for 4 weeks then aspirin alone.  No need for any further neuroimaging studies or echocardiogram as per neurology recommendations.  We will check lipid profile and hemoglobin A1c as per neurology recommendations. Hyponatremia -Resolved  Hypernatremia -Possibly from dehydration.  Resolved.  Acute metabolic acidosis -Treated with bicarb drip.  Resolved.  Off bicarb drip.  Hypokalemia -Improved  Hypomagnesemia -Improved  Anemia of chronic disease -From chronic illnesses.  Hemoglobin slightly drifting down, 7.1 on 08/19/2022.   No signs of bleeding.  Iron profile does not indicate iron deficiency anemia.   Folate/vitamin B12/TSH levels normal as mentioned above. -Hemoglobin was 8.5 on 08/20/2022.  Hemoglobin  pending this morning.  If hemoglobin remains stable, will resume Lovenox.  Stage II right buttock pressure ulcer: Present on admission -Continue local wound care    DVT prophylaxis: Lovenox on hold from 08/19/2022 because of drop in hemoglobin Code Status: Full Family Communication: Spoke to daughters on phone on 08/19/2022 Disposition Plan: Status is: Inpatient Remains inpatient appropriate because: Of severity of illness    Consultants: Palliative care/pulmonary/neurology  Procedures: EEG  Antimicrobials:  Anti-infectives (From admission, onward)    Start     Dose/Rate Route Frequency Ordered Stop   08/19/22 1600  ceFEPIme (MAXIPIME) 1 g in sodium chloride 0.9 % 100 mL IVPB        1 g 200 mL/hr over 30 Minutes Intravenous Every 8 hours 08/19/22 1438 08/20/22 2222   08/16/22 2300  vancomycin (VANCOREADY) IVPB 1250 mg/250 mL  Status:  Discontinued        1,250 mg 166.7 mL/hr over 90 Minutes Intravenous Every 24 hours 08/15/22 1157 08/15/22 1157   08/16/22 1800  ceFEPIme (MAXIPIME) 1 g in sodium chloride 0.9 % 100 mL IVPB  Status:  Discontinued        1 g 200 mL/hr over 30 Minutes Intravenous 2 times daily 08/16/22 1514 08/19/22 1438   08/15/22 2300  vancomycin (VANCOREADY) IVPB 1250 mg/250 mL  Status:  Discontinued        1,250 mg 166.7 mL/hr over 90 Minutes Intravenous Every 24 hours 08/15/22 1157 08/16/22 1121   08/15/22 1100  azithromycin (ZITHROMAX) 500 mg in sodium chloride 0.9 % 250 mL IVPB        500 mg 250 mL/hr over 60 Minutes Intravenous Every 24 hours 08/15/22 1049 08/20/22 1416   08/15/22 1000  vancomycin (VANCOREADY) IVPB 750 mg/150 mL  Status:  Discontinued        750 mg 150 mL/hr over 60 Minutes Intravenous Every 12 hours 08/15/22 0533 08/15/22 1157   08/15/22 0800  ceFEPIme (MAXIPIME) 2 g in sodium chloride 0.9 % 100 mL IVPB  Status:  Discontinued        2  g 200 mL/hr over 30 Minutes Intravenous Every 8 hours 08/15/22 0528 08/16/22 1514   08/14/22 2345  aztreonam (AZACTAM) 2 g in sodium chloride 0.9 % 100 mL IVPB  Status:  Discontinued        2 g 200 mL/hr over 30 Minutes Intravenous  Once 08/14/22 2331 08/14/22 2334   08/14/22 2345  metroNIDAZOLE (FLAGYL) IVPB 500 mg        500 mg 100 mL/hr over 60 Minutes Intravenous  Once 08/14/22 2331 08/15/22 0247   08/14/22 2345  vancomycin (VANCOCIN) IVPB 1000 mg/200 mL premix  Status:  Discontinued        1,000 mg 200 mL/hr over 60 Minutes Intravenous  Once 08/14/22 2331 08/14/22 2334   08/14/22 2345  ceFEPIme (MAXIPIME) 2 g in sodium chloride 0.9 % 100 mL IVPB        2 g 200 mL/hr over 30 Minutes Intravenous  Once 08/14/22 2334 08/15/22 0102   08/14/22 2345  vancomycin (VANCOREADY) IVPB 1250 mg/250 mL        1,250 mg 166.7 mL/hr over 90 Minutes Intravenous  Once 08/14/22 2334 08/15/22 0316        Subjective: Patient seen and examined at bedside.  No agitation, fever, vomiting reported.   Objective: Vitals:   08/20/22 2000 08/21/22 0400 08/21/22 0705 08/21/22 0741  BP: (!) 124/93 108/80  (!) 126/93  Pulse: 93 100 (!) 101 90  Resp: 15 19 16 17   Temp:  98.2 F (36.8 C)  99.9 F (37.7 C)  TempSrc:  Oral  Axillary  SpO2: 100% 97% 99% 99%  Weight:      Height:        Intake/Output Summary (Last 24 hours) at 08/21/2022 0834 Last data filed at 08/21/2022 0700 Gross per 24 hour  Intake 710 ml  Output 1400 ml  Net -690 ml    Filed Weights   08/15/22 1824 08/20/22 0500  Weight: 48.8 kg 48 kg    Examination:  General: On room air.  Not in distress.  Looks chronically ill and deconditioned. ENT/neck: NG tube still present. respiratory: Decreased breath sounds at bases bilaterally with some crackles CVS: S1 S2 heard; mild intermittent tachycardia present  abdominal: Soft, nontender, still distended; no organomegaly, bowel sounds are heard  extremities: Trace lower extremity pain; no  cyanosis CNS: Still very drowsy, wakes up only very minimally; does not follow commands  Lymph: No palpable lymphadenopathy noted  skin: No obvious ecchymosis/lesions  psych: Showing no signs of agitation currently.  Cannot assess psychiatric status because of mental status  musculoskeletal: No obvious joint swelling/tenderness    Data Reviewed: I have personally reviewed following labs and imaging studies  CBC: Recent Labs  Lab 08/15/22 0035 08/15/22 0849 08/16/22 0641 08/17/22 0407 08/18/22 0336 08/19/22 0503 08/20/22 0759  WBC 7.1   < > 7.9 5.8 6.3 6.0 6.3  NEUTROABS 4.7  --   --  3.8 4.2 3.9 3.5  HGB 10.6*   < > 7.9* 8.4* 8.2* 7.1* 8.5*  HCT 32.5*   < > 24.0* 24.9* 24.7* 21.4* 26.2*  MCV 86.4   < > 84.2 81.4 83.2 83.9 83.4  PLT 304   < > 187 198 191 PLATELET CLUMPS NOTED ON SMEAR, UNABLE TO ESTIMATE 183   < > = values in this interval not displayed.    Basic Metabolic Panel: Recent Labs  Lab 08/17/22 1519 08/18/22 0336 08/19/22 0341 08/20/22 0426 08/21/22 0543  NA 143 143 141 140 138  K 2.6* 4.0 4.3 3.6 3.5  CL 98 104 110 108 108  CO2 32 26 23 25 24   GLUCOSE 105* 76 109* 81 137*  BUN 6 5* <5* <5* 6  CREATININE 0.77 0.74 0.79 0.75 0.74  CALCIUM 8.5* 8.4* 8.4* 8.4* 8.1*  MG 1.6* 2.3 2.0 1.6* 2.6*  PHOS  --   --  2.0*  --   --     GFR: Estimated Creatinine Clearance: 63 mL/min (by C-G formula based on SCr of 0.74 mg/dL). Liver Function Tests: Recent Labs  Lab 08/15/22 0242 08/17/22 1029  AST 16 14*  ALT 9 7  ALKPHOS 60 58  BILITOT 1.0 0.6  PROT 5.6* 4.7*  ALBUMIN 2.9* 2.1*    No results for input(s): "LIPASE", "AMYLASE" in the last 168 hours. Recent Labs  Lab 08/17/22 0407  AMMONIA 18    Coagulation Profile: Recent Labs  Lab 08/15/22 0035  INR 1.1    Cardiac Enzymes: No results for input(s): "CKTOTAL", "CKMB", "CKMBINDEX", "TROPONINI" in the last 168 hours. BNP (last 3 results) No results for input(s): "PROBNP" in the last 8760  hours. HbA1C: No results for input(s): "HGBA1C" in the last 72 hours. CBG: Recent Labs  Lab 08/20/22 1550 08/20/22 2000 08/20/22 2336 08/21/22 0320 08/21/22 0745  GLUCAP 147* 129* 114* 117* 147*    Lipid Profile: No results for input(s): "CHOL", "HDL", "LDLCALC", "TRIG", "CHOLHDL", "LDLDIRECT" in the last 72 hours.  Thyroid Function Tests: Recent Labs    08/19/22 2046  TSH 2.174    Anemia Panel: Recent Labs    08/19/22 2046 08/20/22 0426  VITAMINB12 828  --   FOLATE 10.1  --   FERRITIN  --  802*  TIBC  --  101*  IRON  --  34    Sepsis Labs: Recent Labs  Lab 08/15/22 0005 08/15/22 0425 08/19/22 1545 08/20/22 0426  PROCALCITON  --   --  <0.10 <0.10  LATICACIDVEN 2.3* 1.4  --   --      Recent Results (from the past 240 hour(s))  Resp Panel by RT-PCR (Flu A&B, Covid) Anterior Nasal Swab     Status: None   Collection Time: 08/15/22 12:05 AM   Specimen: Anterior Nasal Swab  Result Value Ref Range Status   SARS Coronavirus 2 by RT PCR NEGATIVE NEGATIVE Final    Comment: (NOTE) SARS-CoV-2 target nucleic acids are NOT DETECTED.  The SARS-CoV-2 RNA is generally detectable in upper respiratory specimens during the acute phase of infection. The lowest concentration of SARS-CoV-2 viral copies this assay can detect is 138 copies/mL. A negative result does not preclude SARS-Cov-2 infection and should not be used as the sole basis for treatment or other patient management decisions. A negative result may occur with  improper specimen collection/handling, submission of specimen other than nasopharyngeal swab, presence of viral mutation(s) within the areas targeted by this assay, and inadequate number of viral copies(<138 copies/mL). A negative result must be combined with clinical observations, patient history, and epidemiological information. The expected result is Negative.  Fact Sheet for Patients:  BloggerCourse.com  Fact Sheet for  Healthcare Providers:  SeriousBroker.it  This test is no t yet approved or cleared by the [place] FDA and  has been authorized for detection and/or diagnosis of SARS-CoV-2 by FDA under an Emergency Use Authorization (EUA). This EUA will remain  in effect (meaning this test can be used) for the duration of the COVID-19 declaration under Section 564(b)(1) of the Act, 21 U.S.C.section 360bbb-3(b)(1), unless the authorization is terminated  or revoked sooner.       Influenza A by PCR NEGATIVE NEGATIVE Final   Influenza B by PCR NEGATIVE NEGATIVE Final    Comment: (NOTE) The Xpert Xpress SARS-CoV-2/FLU/RSV plus assay is intended as an aid in the diagnosis of influenza from Nasopharyngeal swab specimens and should not be used as a sole basis for treatment. Nasal washings and aspirates are unacceptable for Xpert Xpress SARS-CoV-2/FLU/RSV testing.  Fact Sheet for Patients: BloggerCourse.com  Fact Sheet for Healthcare Providers: SeriousBroker.it  This test is not yet approved or cleared by the [place] FDA and has been authorized for detection and/or diagnosis of SARS-CoV-2 by FDA under an Emergency Use Authorization (EUA). This EUA will remain in effect (meaning this test can be used) for the duration of the COVID-19 declaration under Section 564(b)(1) of the Act, 21 U.S.C. section 360bbb-3(b)(1), unless the authorization is terminated or revoked.  Performed at [facility], [address], [place], [place] [address]   Blood Culture (routine x 2)     Status: None   Collection Time: 08/15/22 12:08 AM   Specimen: BLOOD RIGHT HAND  Result Value Ref Range Status   Specimen Description BLOOD RIGHT HAND  Final   Special Requests   Final    BOTTLES DRAWN AEROBIC AND ANAEROBIC Blood Culture adequate volume   Culture   Final    NO GROWTH 5 DAYS Performed at  [facility], [address] [place], [place] [address]    Report Status 08/20/2022 FINAL  Final  Blood Culture (routine x 2)     Status: None   Collection Time: 08/15/22 12:40 AM   Specimen: BLOOD LEFT HAND  Result Value Ref Range Status   Specimen Description BLOOD LEFT HAND  Final   Special Requests   Final    BOTTLES DRAWN AEROBIC AND ANAEROBIC Blood Culture adequate volume   Culture   Final    NO GROWTH 5 DAYS Performed at [facility], [address] [place], [place] [address]    Report Status 08/20/2022 FINAL  Final         Radiology Studies: DG CHEST PORT 1 VIEW  Result Date: 08/20/2022 CLINICAL DATA:  Cough, pneumonia EXAM: PORTABLE CHEST 1 VIEW COMPARISON:  08/14/2022 chest radiograph. FINDINGS: Enteric tube enters the stomach with the tip seen in the right abdomen in the region of the pylorus. Stable cardiomediastinal silhouette with normal heart size. No pneumothorax. No pleural effusions. No pulmonary edema. Hazy left retrocardiac opacity is unchanged. IMPRESSION: 1. Enteric tube enters the stomach with the tip seen in the right abdomen in the region of the pylorus. 2. Stable hazy left retrocardiac opacity, favor atelectasis. Electronically Signed   By: Delbert PhenixJason A Poff M.D.   On: 08/20/2022 08:45   DG Abd 1 View  Result Date: 08/20/2022 CLINICAL DATA:  NG tube placement EXAM: ABDOMEN - 1 VIEW COMPARISON:  None Available. FINDINGS: The NG tube side port and distal tip project over the stomach. The bowel gas pattern is normal. No radio-opaque calculi or other significant radiographic abnormality are seen. Visualized bibasilar lungs are clear. IMPRESSION: Adequate positioning of the NG tube, with side port and distal tip in the stomach. Electronically Signed   By: Jacob MooresMeghana  Konanur M.D.   On: 08/20/2022 08:35   MR BRAIN WO CONTRAST  Result Date: 08/20/2022 CLINICAL DATA:  Delirium EXAM: MRI HEAD WITHOUT CONTRAST TECHNIQUE: Multiplanar, multiecho pulse sequences of the brain and surrounding structures  were obtained without intravenous contrast. COMPARISON:  03/23/2022 FINDINGS: Brain: Increased signal on diffusion-weighted imaging in the right corona radiata (series 9, image 87), without ADC correlate, likely subacute infarcts with normalization of the ADC. These areas are new compared to 03/23/2022. Additional more punctate foci of increased signal on diffusion-weighted imaging in the right frontal lobe, without ADC correlates (series 9, image 92 and 93), are also favored to represent subacute infarcts. No acute hemorrhage, mass, mass effect, or midline shift. No hydrocephalus or extra-axial collection. 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 Remote lacunar infarcts in the bilateral basal ganglia, bilateral thalami, and right corona radiata T2 hyperintense signal in the periventricular white matter, likely the sequela of mild-to-moderate chronic small vessel ischemic disease. Vascular: Normal arterial flow voids. Skull and upper cervical spine: Normal marrow signal. Sinuses/Orbits: Dysconjugate gaze. Mucosal thickening in the left maxillary sinus. Other: Trace fluid in left mastoid air cells. IMPRESSION: Subacute infarcts in the right frontal lobe. No acute infarct or hemorrhage. Electronically Signed   By: Wiliam KeAlison  Vasan M.D.   On: 08/20/2022 03:10        Scheduled Meds:  amantadine  100 mg Per Tube BID   aspirin  81 mg Per Tube Daily   atorvastatin  80 mg Per Tube Daily   Chlorhexidine Gluconate Cloth  6 each Topical Daily   folic acid  1 mg Per  Tube Daily   free water  100 mL Per Tube Q6H   guaiFENesin  10 mL Per Tube BID   multivitamin  15 mL Per Tube Daily   sodium chloride HYPERTONIC  4 mL Nebulization BID   thiamine (VITAMIN B1) injection  100 mg Intravenous Daily   ticagrelor  90 mg Per Tube BID   Continuous Infusions:  feeding supplement (OSMOLITE 1.2 CAL) 40 mL/hr at 08/19/22 2000           Glade Lloyd, MD [facility] 08/21/2022, 8:34 AM

## 2022-08-21 NOTE — Progress Notes (Signed)
Patient removed NGT. MD paged for orders.

## 2022-08-21 NOTE — Progress Notes (Signed)
NGT removed by patient. Continuous tube feeds in progress and patient at risk for aspiration by removing NGT while feeding in progress. Andrez Grime MD notified and orders received for patient to receive Cortrack with bridle. Will monitor

## 2022-08-21 NOTE — Progress Notes (Signed)
SLP Cancellation Note  Patient Details Name: Cindy Brooks MRN: [id number] DOB: 1971-07-12   Cancelled treatment:       Reason Eval/Treat Not Completed: Patient unavailable  Pt currently unavailable due to nursing needs. RN reports pt is more alert this afternoon.  Will continue efforts.  Johnice Riebe B. Murvin Natal, [facility], CCC-SLP Speech Language Pathologist Office: [phone]  Leigh Aurora 08/21/2022, 1:25 PM

## 2022-08-21 NOTE — Progress Notes (Signed)
Daily Progress Note   Patient Name: Cindy Brooks       Date: 08/21/2022 DOB: 28-May-1971  Age: 51 y.o. MRN#: [id number] Attending Physician: Glade Lloyd, MD Primary Care Physician: Hoy Register, MD Admit Date: 08/14/2022  Reason for Consultation/Follow-up: Establishing goals of care  Subjective: Medical records reviewed including progress notes, labs, imaging. Patient assessed at the bedside.  She opened her eyes to my voice and nodded her head yes when asked if she was in pain. She does not respond further to any other questions.  No family present during my visit.  Discussed with RN and patient to receive the dose of Tylenol.  I called patient's daughter Cindy Brooks to provide ongoing palliative support.  I shared updates on my interaction with the patient and she shares that the pain is likely in patient's right knee.  They usually use Biofreeze and she is agreeable to trying a muscle rub inpatient.  We discussed MRI results and medication adjustments from Plavix to aspirin and Brilinta.  Provided update on results of lipid profile and A1c.  She shared her understanding is that the "mini strokes" have not changed her neurologic or functional status.  Counseled that this is in the context of her prior stroke, which resulted in baseline deficits worse than these subacute strokes.  We also discussed the difficulty in managing patient's mucous plugging given poor cough reflex and mobility.  She appreciates that there are different things being tried, such as keeping the patient off her left side.  She requests a PT consult as well.  She has additional questions regarding patient's anemia and shares her research on bone marrow transplantation.  Provided education on this intervention and rational for this  being a poor option for her mother.  Questions and concerns addressed.  PMT will continue to support holistically.   Length of Stay: 6  Physical Exam Vitals and nursing note reviewed.  Constitutional:      General: She is not in acute distress.    Appearance: She is ill-appearing.  Cardiovascular:     Rate and Rhythm: Tachycardia present.  Pulmonary:     Effort: Pulmonary effort is normal.  Skin:    General: Skin is cool and dry.  Neurological:     Mental Status: She is unresponsive.  Vital Signs: BP (!) 126/93 (BP Location: Right Arm)   Pulse 90   Temp 99.9 F (37.7 C) (Axillary)   Resp 17   Ht 5\' 4"  (1.626 m)   Wt 48 kg   LMP  (LMP Unknown)   SpO2 99%   BMI 18.16 kg/m  SpO2: SpO2: 99 % O2 Device: O2 Device: Room Air O2 Flow Rate:        Palliative Assessment/Data: 10%   Palliative Care Assessment & Plan   Patient Profile: 51 y.o. female  with past medical history of recurrent UTI, CVA with deficits, depression, chronic pain admitted on 08/14/2022 with cough and altered mental status.    Patient is admitted for severe sepsis due to community-acquired pneumonia, UTI.  PMT has been consulted to assist with goals of care conversation.   Assessment: Goals of care conversation Recurrent UTI CVA with deficits Left lower lobe mucous plugging with atelectasis Acute metabolic encephalopathy Acute metabolic acidosis  Recommendations/Plan: Continue full code/full scope treatment PT evaluation requested per family Ordered Zostrix cream twice daily for right knee pain Goals remain clear for all aggressive life-prolonging interventions Psychosocial and emotional support provided PMT will continue to follow and support   Prognosis: Poor  Discharge Planning: To Be Determined  Care plan was discussed with patient's daughter     MDM high         Loris Winrow 13/10/2021, PA-C  Palliative Medicine Team Team phone # [phone]  Thank you for allowing  the Palliative Medicine Team to assist in the care of this patient. Please utilize secure chat with additional questions, if there is no response within 30 minutes please call the above phone number.  Palliative Medicine Team providers are available by phone from 7am to 7pm daily and can be reached through the team cell phone.  Should this patient require assistance outside of these hours, please call the patient's attending physician.

## 2022-08-22 DIAGNOSIS — J159 Unspecified bacterial pneumonia: Secondary | ICD-10-CM | POA: Diagnosis not present

## 2022-08-22 DIAGNOSIS — R1310 Dysphagia, unspecified: Secondary | ICD-10-CM | POA: Diagnosis not present

## 2022-08-22 DIAGNOSIS — I63513 Cerebral infarction due to unspecified occlusion or stenosis of bilateral middle cerebral arteries: Secondary | ICD-10-CM | POA: Diagnosis not present

## 2022-08-22 DIAGNOSIS — G9341 Metabolic encephalopathy: Secondary | ICD-10-CM | POA: Diagnosis not present

## 2022-08-22 LAB — GLUCOSE, CAPILLARY
Glucose-Capillary: 114 mg/dL — ABNORMAL HIGH (ref 70–99)
Glucose-Capillary: 165 mg/dL — ABNORMAL HIGH (ref 70–99)
Glucose-Capillary: 56 mg/dL — ABNORMAL LOW (ref 70–99)
Glucose-Capillary: 66 mg/dL — ABNORMAL LOW (ref 70–99)
Glucose-Capillary: 66 mg/dL — ABNORMAL LOW (ref 70–99)
Glucose-Capillary: 68 mg/dL — ABNORMAL LOW (ref 70–99)
Glucose-Capillary: 72 mg/dL (ref 70–99)
Glucose-Capillary: 79 mg/dL (ref 70–99)
Glucose-Capillary: 85 mg/dL (ref 70–99)
Glucose-Capillary: 91 mg/dL (ref 70–99)

## 2022-08-22 MED ORDER — DEXTROSE 50 % IV SOLN
INTRAVENOUS | Status: AC
  Filled 2022-08-22: qty 50

## 2022-08-22 MED ORDER — FREE WATER
100.0000 mL | Freq: Four times a day (QID) | Status: DC
  Administered 2022-08-23 – 2022-08-29 (×23): 100 mL

## 2022-08-22 MED ORDER — DEXTROSE 50 % IV SOLN
INTRAVENOUS | Status: AC
  Administered 2022-08-22: 50 mL
  Filled 2022-08-22: qty 50

## 2022-08-22 MED ORDER — DEXTROSE 50 % IV SOLN
12.5000 g | INTRAVENOUS | Status: AC
  Administered 2022-08-22: 12.5 g via INTRAVENOUS

## 2022-08-22 MED ORDER — DEXTROSE 50 % IV SOLN
INTRAVENOUS | Status: AC
  Administered 2022-08-22: 25 g via INTRAVENOUS
  Filled 2022-08-22: qty 50

## 2022-08-22 MED ORDER — DEXTROSE 50 % IV SOLN: 25.0000 g | INTRAVENOUS | Status: AC

## 2022-08-22 MED ORDER — DEXTROSE 5 % IV SOLN: INTRAVENOUS | Status: DC

## 2022-08-22 NOTE — Progress Notes (Addendum)
Hypoglycemic Event  CBG: 56  Treatment: D50 50 mL (25 gm)  Symptoms: None  Follow-up CBG: Time:0930 CBG Result: 165   Possible Reasons for Event: Inadequate meal intake  Comments/MD notified: Mahala Menghini, MD    Darlina Rumpf, RN

## 2022-08-22 NOTE — Progress Notes (Signed)
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/22/22 Multiple subsequent admits 03/2022 for AMS, 04/2022 for UTI  Came to Ed with altered mental status on 08/15/22 Tachy 140, lactic 2.3, WBC 7, Sod 149 Admitted with likely aspiration pneumonia and severe sepsis and placed on broad-spectrum antibiotics initially  11/3 palliative care consulted 11/4 EEG shows encephalopathy with encephalopathy 11/6 pulmonary consulted for left lower lobe partial postobstructive collapse and mediastinal shift they recommended placing patient right lung down to facilitate pulmonary drainage--felt like she was going to be at high risk for aspiration regardless 11/7 neurology consulted secondary to stroke found on MRI  Hospital-Problem based course  Severe sepsis on admission secondary to aspiration pneumonia -Mucoid debris in left-sided airways left overall lower and left upper lobe - Completed 7-day course of antibiotics 11/7 - Discussed with family lung collapse postobstructive pneumonia etc. and feels she is not safe to eat based on speech therapy input therefore we will be placing back PEG tube that had been dislodged by patient several months ago  UTI from prior to admission likely secondary to indwelling Foley - Patient completed antibiotics  Hypoglycemia -Patient pulled out NG tube on 11/9 AM-iPad I detailed discussion with started D5 to maintain her sugar - Patient will be resumed on feeds after PEG tube placement  Acute metabolic encephalopathy, underlying prior CVA Possible encephalopathy from multiple meds - Gabapentin and Cymbalta other meds all held - Neurology consulted and recommending Brilinta for 4 weeks then aspirin alone no further work-up - Metabolic work-up ammonia TSH B12 all  normal - Unclear if mental status was improved to baseline-discussed with family in detail still wants full code  Hyponatremia acute metabolic acidosis hypokalemia hypomagnesemia - Metabolic derangements have resolved - Periodic labs  Anemia of chronic disease - Hemoglobin ranging from 7-8-monitor trends Lovenox to resume  Stage II right buttock pressure ulcer present from admission - Continue local wound care    DVT prophylaxis: Lovenox Code Status: Full Family Communication: Discussed with daughter [name] [phone] on phone--discussed with her chronic and likely indolent and recurrent nature of aspiration and that PEG feeds and other methods of treating this are very difficult and that her mom has poor prognosis-she still wants full CODE STATUS We will be planning for PEG tube tomorrow Disposition:  Status is: Inpatient Remains inpatient appropriate because:   Remains critically ill   Consultants:  Urology Pulmonology  Procedures: Multiple  Antimicrobials: Completed   Subjective: Poor interactivity-answers but more gibberish and cannot make sense She is pulling out her Foley her mittens are off Nursing reports no specific issue  Objective: Vitals:   08/22/22 0857 08/22/22 1217 08/22/22 1218 08/22/22 1300  BP:   108/87   Pulse: (!) 103     Resp:   (!) 21 20  Temp:  98.1 F (36.7 C)    TempSrc:  Axillary    SpO2:      Weight:      Height:        Intake/Output Summary (Last 24 hours) at 08/22/2022 1513 Last data filed at 08/21/2022 2100 Gross per 24 hour  Intake --  Output 600 ml  Net -600 ml   Filed Weights   08/15/22 1824 08/20/22 0500 08/22/22 0500  Weight: 48.8 kg 48 kg 56.2 kg    Examination:  EOMI  NCAT mumbling speech poor exam No icterus no pallor Some drool Chest is clear no wheeze rales rhonchi S1-S2 no murmur Abdomen soft Foley in place ROM intact grossly to upper extremities could not perform full neuro exam sensory  etc.  Data Reviewed: personally reviewed   CBC    Component Value Date/Time   WBC 6.3 08/21/2022 0543   RBC 2.64 (L) 08/21/2022 0543   HGB 7.3 (L) 08/21/2022 0543   HGB 11.1 (L) 06/14/2022 1040   HCT 22.4 (L) 08/21/2022 0543   PLT 199 08/21/2022 0543   PLT 244 06/14/2022 1040   MCV 84.8 08/21/2022 0543   MCH 27.7 08/21/2022 0543   MCHC 32.6 08/21/2022 0543   RDW 16.1 (H) 08/21/2022 0543   LYMPHSABS 1.4 08/21/2022 0543   MONOABS 0.8 08/21/2022 0543   EOSABS 0.1 08/21/2022 0543   BASOSABS 0.0 08/21/2022 0543      Latest Ref Rng & Units 08/21/2022    5:43 AM 08/20/2022    4:26 AM 08/19/2022    3:41 AM  CMP  Glucose 70 - 99 mg/dL 137  81  109   BUN 6 - 20 mg/dL 6  <5  <5   Creatinine 0.44 - 1.00 mg/dL 0.74  0.75  0.79   Sodium 135 - 145 mmol/L 138  140  141   Potassium 3.5 - 5.1 mmol/L 3.5  3.6  4.3   Chloride 98 - 111 mmol/L 108  108  110   CO2 22 - 32 mmol/L 24  25  23    Calcium 8.9 - 10.3 mg/dL 8.1  8.4  8.4      Radiology Studies: DG Abd Portable 1V  Result Date: 08/21/2022 CLINICAL DATA:  NG tube placement. EXAM: PORTABLE ABDOMEN - 1 VIEW COMPARISON:  08/20/2022 FINDINGS: The NG tube tip is in stable position with its tip in the antropyloric region of the stomach. IMPRESSION: NG tube tip in the antropyloric region of the stomach. Electronically Signed   By: Marijo Sanes M.D.   On: 08/21/2022 09:31     Scheduled Meds:  amantadine  100 mg Per Tube BID   aspirin  81 mg Per Tube Daily   atorvastatin  80 mg Per Tube Daily   capsaicin   Topical BID   Chlorhexidine Gluconate Cloth  6 each Topical Daily   dextrose       folic acid  1 mg Per Tube Daily   free water  100 mL Per Tube Q6H   guaiFENesin  10 mL Per Tube BID   multivitamin  15 mL Per Tube Daily   scopolamine  1 patch Transdermal Q72H   thiamine (VITAMIN B1) injection  100 mg Intravenous Daily   ticagrelor  90 mg Per Tube BID   Continuous Infusions:  dextrose 75 mL/hr at 08/22/22 1257   feeding  supplement (OSMOLITE 1.2 CAL) Stopped (08/22/22 0700)     LOS: 7 days   Time spent: [place], MD [facility] To contact the attending provider between 7A-7P or the covering provider during after hours 7P-7A, please log into the web site [email or web address] and access using universal [place] password for that web site. If you do not have the password, please call the hospital operator.  08/22/2022, 3:13 PM

## 2022-08-22 NOTE — TOC Progression Note (Signed)
Transition of Care [facility]) - Progression Note    Patient Details  Name: Cindy Brooks MRN: [id number] Date of Birth: [birth year]  Transition of Care [facility] CM/SW Contact  Harriet Masson, RN Phone Number: 08/22/2022, 1:15 PM  Clinical Narrative:     Patient is not medically stable for discharge. Patient pulled out NG tube. Orders for Cortrak.   TOC will continue to follow for needs.    Expected Discharge Plan and Services                                                 Social Determinants of Health (SDOH) Interventions    Readmission Risk Interventions    05/28/2022    2:27 PM 03/13/2022    9:03 AM  Readmission Risk Prevention Plan  Transportation Screening Complete Complete  PCP or Specialist Appt within 5-7 Days  Complete  PCP or Specialist Appt within 3-5 Days Complete   Home Care Screening  Complete  Medication Review (RN CM)  Complete  HRI or Home Care Consult Complete   Social Work Consult for Recovery Care Planning/Counseling Complete   Palliative Care Screening Not Applicable   Medication Review Oceanographer) Complete

## 2022-08-22 NOTE — Evaluation (Signed)
Physical Therapy Evaluation and Discharge Patient Details Name: Cindy Brooks MRN: [id number] DOB: 1971-04-11 Today's Date: 08/22/2022  History of Present Illness  51 y.o. female who presents emergency department 08/15/22 due to cough and altered mental status. Adm for sepsis due to UTI and pna;   PMH significant of recurrent UTI, patient is nonverbal at baseline and bedbound due to prior stroke 01/2022; HTN; PEG  Clinical Impression   Patient apparently bedbound at home PTA (per chart). She is now 7 months s/p bilateral CVAs resulting in profound disability. Chart reviewed and noted decline in function with each admission since discharged 03/31/22 to home with daughters to care for her. Patient with multiple contractures and grimaces when PROM attempted (bil knees, Rt hand, bil shoulders). Despite ability to follow command to bend at elbows, patient unable to functionally use UEs to assist with repositioning in bed. Patient appears to be at her new baseline and not appropriate for skilled acute PT. Will sign off. Anticipate daughter may want to attempt HHPT on discharge and this can be ordered by physician at their discretion.      Recommendations for follow up therapy are one component of a multi-disciplinary discharge planning process, led by the attending physician.  Recommendations may be updated based on patient status, additional functional criteria and insurance authorization.  Follow Up Recommendations No PT follow up (Anticipate pt has finished course of HHPT, however if HHPT still involved, could resume services on discharge)      Assistance Recommended at Discharge Frequent or constant Supervision/Assistance  Patient can return home with the following  Other (comment) (total assist for mobility; PTAR for transport home)    Equipment Recommendations Hospital bed (if family does not already have one)  Recommendations for Other Services       Functional Status Assessment Patient has  not had a recent decline in their functional status     Precautions / Restrictions Precautions Precautions: Fall      Mobility  Bed Mobility Overal bed mobility: Needs Assistance Bed Mobility: Rolling Rolling: Total assist         General bed mobility comments: unable to initiate rolling    Transfers                   General transfer comment: deferred due to bedbound status at home    Ambulation/Gait                  Stairs            Wheelchair Mobility    Modified Rankin (Stroke Patients Only)       Balance                                             Pertinent Vitals/Pain Pain Assessment Pain Assessment: Faces Faces Pain Scale: Hurts even more Pain Location: Rt knee >left knee with attempts at flexion Pain Descriptors / Indicators: Grimacing Pain Intervention(s): Limited activity within patient's tolerance, Monitored during session, Repositioned    Home Living Family/patient expects to be discharged to:: Private residence Living Arrangements: Children Available Help at Discharge: Family;Available 24 hours/day Type of Home: House Home Access: Stairs to enter Entrance Stairs-Rails: None Entrance Stairs-Number of Steps: 3   Home Layout: One level   Additional Comments: from prior medical record--pt cannot provide    Prior Function Prior Level of Function : Needs  assist             Mobility Comments: per chart, pt now bedbound       Hand Dominance   Dominant Hand: Right    Extremity/Trunk Assessment   Upper Extremity Assessment Upper Extremity Assessment: RUE deficits/detail;LUE deficits/detail RUE Deficits / Details: Shoulder flexion limited 20 degrees, full internal rotation, no external rotation; elbow WFL; hand fisted with grimacing when trying to open hand; bends elbow spontaneously and on command LUE Deficits / Details: Shoulder flexion ~30 degrees, full internal rotation, no external  rotation; elbow WFL; hand with nmitt/NT    Lower Extremity Assessment Lower Extremity Assessment: RLE deficits/detail;LLE deficits/detail RLE Deficits / Details: actively wiggles toes and DF ankle with max cues and facilitation; hip flexion to 40 degrees with tightness noted; knee flexion 10 degrees with significant resistance and grimacing; ankle DF to neutral; able to perform AAROM for hip abdct and hip flexion LLE Deficits / Details: actively wiggles toes, PROM ankle DF to neutral; hip flexion to 80 degrees with tightness noted; knee flexion 60 degrees with significant resistance and grimacing; able to perform AAROM for hip abdct and hip flexion    Cervical / Trunk Assessment Cervical / Trunk Assessment: Kyphotic (chin resting on chest in bed with HOB elevated ~20 degrees)  Communication   Communication: Expressive difficulties (nods head yes to some questions)  Cognition Arousal/Alertness: Awake/alert Behavior During Therapy: Flat affect Overall Cognitive Status: Difficult to assess                                 General Comments: Nods "yes" to "Is your name Taisia"; no other responses generated with yes/no questions        General Comments General comments (skin integrity, edema, etc.): No skin issues noted, although could not visualize sacrum as using pad to assist in rolling and holding pt over on her side.    Exercises Other Exercises Other Exercises: AAROM to PROM x 4 extremities within her available ROM (see above) x 3 reps each plane of movement   Assessment/Plan    PT Assessment All further PT needs can be met in the next venue of care  PT Problem List Decreased strength;Decreased range of motion;Decreased mobility;Impaired tone       PT Treatment Interventions      PT Goals (Current goals can be found in the Care Plan section)  Acute Rehab PT Goals PT Goal Formulation: All assessment and education complete, DC therapy    Frequency        Co-evaluation               AM-PAC PT "6 Clicks" Mobility  Outcome Measure Help needed turning from your back to your side while in a flat bed without using bedrails?: Total Help needed moving from lying on your back to sitting on the side of a flat bed without using bedrails?: Total Help needed moving to and from a bed to a chair (including a wheelchair)?: Total Help needed standing up from a chair using your arms (e.g., wheelchair or bedside chair)?: Total Help needed to walk in hospital room?: Total Help needed climbing 3-5 steps with a railing? : Total 6 Click Score: 6    End of Session   Activity Tolerance: Patient limited by pain (grimaces with endrange movements) Patient left: in bed;with call bell/phone within reach;with bed alarm set Nurse Communication: Other (comment) (no skilled PT needs at this  time) PT Visit Diagnosis: Hemiplegia and hemiparesis Hemiplegia - Right/Left: Right Hemiplegia - dominant/non-dominant: Dominant Hemiplegia - caused by: Cerebral infarction    Time: 1435-1444 PT Time Calculation (min) (ACUTE ONLY): 9 min   Charges:   PT Evaluation $PT Eval Low Complexity: [place], PT Acute Rehabilitation Services  Office [phone]   Rexanne Mano 08/22/2022, 3:25 PM

## 2022-08-22 NOTE — Progress Notes (Signed)
Hypoglycemic Event  CBG: 68  Treatment: D50 25 mL (12.5 gm)  Symptoms: None  Follow-up CBG: Time:1249 CBG Result:114  Possible Reasons for Event: Inadequate meal intake  Comments/MD notified: Dr. Armando Reichert

## 2022-08-22 NOTE — Progress Notes (Addendum)
Dr. Arville Care was made aware that the pt is A&O to self. Glucose was 66. Pt was given D50% 32ml IV per hypogylcemic protocol. Repeat glucose is 91 D5w is infusing at 75ml/hr. Pt is NPO for PEG placement tomorrow. No new orders given. Continue with current POC.

## 2022-08-22 NOTE — Progress Notes (Signed)
Daily Progress Note   Patient Name: Cindy Brooks       Date: 08/22/2022 DOB: [birth year]  Age: 51 y.o. MRN#: [id number] Attending Physician: Rhetta Mura, MD Primary Care Physician: Hoy Register, MD Admit Date: 08/14/2022  Reason for Consultation/Follow-up: Establishing goals of care  Subjective: Medical records reviewed including progress notes, labs, imaging. Patient assessed at the bedside. She is wearing mittens, cortrak has been removed. No family present at the bedside.  I called patient's daughter Cindy Brooks for palliative support but was unable to reach. Unable to leave a voicemail as her inbox was full. PMT contact information was previously provided.   Questions and concerns addressed.  PMT will continue to support holistically.   Length of Stay: 7  Physical Exam Vitals and nursing note reviewed.  Constitutional:      General: She is not in acute distress.    Appearance: She is ill-appearing.  Cardiovascular:     Rate and Rhythm: Tachycardia present.  Pulmonary:     Effort: Pulmonary effort is normal.  Skin:    General: Skin is cool and dry.  Neurological:     Mental Status: She is lethargic.   Vital Signs: BP (!) 127/97 (BP Location: Right Arm)   Pulse (!) 103   Temp 98 F (36.7 C) (Axillary)   Resp 20   Ht 5\' 4"  (1.626 m)   Wt 56.2 kg   LMP  (LMP Unknown)   SpO2 97%   BMI 21.27 kg/m  SpO2: SpO2: 97 % O2 Device: O2 Device: Room Air O2 Flow Rate:        Palliative Assessment/Data: 10%   Palliative Care Assessment & Plan   Patient Profile: 51 y.o. female  with past medical history of recurrent UTI, CVA with deficits, depression, chronic pain admitted on 08/14/2022 with cough and altered mental status.    Patient is admitted for severe sepsis due  to community-acquired pneumonia, UTI.  PMT has been consulted to assist with goals of care conversation.   Assessment: Goals of care conversation Recurrent UTI CVA with deficits Left lower lobe mucous plugging with atelectasis Acute metabolic encephalopathy Acute metabolic acidosis  Recommendations/Plan: Continue full code/full scope treatment Psychosocial and emotional support provided PMT will continue to follow and support   Prognosis: Poor  Discharge Planning: To Be Determined    MDM  high         Deontay Ladnier Jeni Salles, PA-C  Palliative Medicine Team Team phone # [phone]  Thank you for allowing the Palliative Medicine Team to assist in the care of this patient. Please utilize secure chat with additional questions, if there is no response within 30 minutes please call the above phone number.  Palliative Medicine Team providers are available by phone from 7am to 7pm daily and can be reached through the team cell phone.  Should this patient require assistance outside of these hours, please call the patient's attending physician.

## 2022-08-23 ENCOUNTER — Inpatient Hospital Stay (HOSPITAL_COMMUNITY): Payer: No Typology Code available for payment source

## 2022-08-23 DIAGNOSIS — J159 Unspecified bacterial pneumonia: Secondary | ICD-10-CM | POA: Diagnosis not present

## 2022-08-23 LAB — CBC
HCT: 21.4 % — ABNORMAL LOW (ref 36.0–46.0)
Hemoglobin: 7.4 g/dL — ABNORMAL LOW (ref 12.0–15.0)
MCH: 28 pg (ref 26.0–34.0)
MCHC: 34.6 g/dL (ref 30.0–36.0)
MCV: 81.1 fL (ref 80.0–100.0)
Platelets: 249 10*3/uL (ref 150–400)
RBC: 2.64 MIL/uL — ABNORMAL LOW (ref 3.87–5.11)
RDW: 15.7 % — ABNORMAL HIGH (ref 11.5–15.5)
WBC: 7.1 10*3/uL (ref 4.0–10.5)
nRBC: 0 % (ref 0.0–0.2)

## 2022-08-23 LAB — COMPREHENSIVE METABOLIC PANEL
ALT: 14 U/L (ref 0–44)
AST: 22 U/L (ref 15–41)
Albumin: 2.1 g/dL — ABNORMAL LOW (ref 3.5–5.0)
Alkaline Phosphatase: 92 U/L (ref 38–126)
Anion gap: 9 (ref 5–15)
BUN: <5 mg/dL — ABNORMAL LOW (ref 6–20)
CO2: 23 mmol/L (ref 22–32)
Calcium: 8.5 mg/dL — ABNORMAL LOW (ref 8.9–10.3)
Chloride: 105 mmol/L (ref 98–111)
Creatinine, Ser: 0.73 mg/dL (ref 0.44–1.00)
GFR, Estimated: >60 mL/min (ref >60)
Glucose, Bld: 111 mg/dL — ABNORMAL HIGH (ref 70–99)
Potassium: 3.7 mmol/L (ref 3.5–5.1)
Sodium: 137 mmol/L (ref 135–145)
Total Bilirubin: 0.2 mg/dL — ABNORMAL LOW (ref 0.3–1.2)
Total Protein: 5 g/dL — ABNORMAL LOW (ref 6.5–8.1)

## 2022-08-23 LAB — GLUCOSE, CAPILLARY
Glucose-Capillary: 105 mg/dL — ABNORMAL HIGH (ref 70–99)
Glucose-Capillary: 109 mg/dL — ABNORMAL HIGH (ref 70–99)
Glucose-Capillary: 112 mg/dL — ABNORMAL HIGH (ref 70–99)
Glucose-Capillary: 145 mg/dL — ABNORMAL HIGH (ref 70–99)
Glucose-Capillary: 156 mg/dL — ABNORMAL HIGH (ref 70–99)
Glucose-Capillary: 35 mg/dL — CL (ref 70–99)
Glucose-Capillary: 84 mg/dL (ref 70–99)
Glucose-Capillary: 99 mg/dL (ref 70–99)

## 2022-08-23 LAB — MAGNESIUM: Magnesium: 1.7 mg/dL (ref 1.7–2.4)

## 2022-08-23 LAB — VITAMIN B1: Vitamin B1 (Thiamine): 267.2 nmol/L — ABNORMAL HIGH (ref 66.5–200.0)

## 2022-08-23 MED ORDER — DEXTROSE 50 % IV SOLN: 25.0000 g | INTRAVENOUS | Status: AC

## 2022-08-23 MED ORDER — DEXTROSE 50 % IV SOLN
INTRAVENOUS | Status: AC
  Administered 2022-08-23: 50 mL
  Filled 2022-08-23: qty 50

## 2022-08-23 MED ORDER — DEXTROSE 10 % IV SOLN: INTRAVENOUS | Status: DC

## 2022-08-23 NOTE — TOC Progression Note (Signed)
Transition of Care [facility]) - Progression Note    Patient Details  Name: Caran Storck MRN: [id number] Date of Birth: Mar 04, 1971  Transition of [facility]) CM/SW Contact  Lockie Pares, RN Phone Number: 08/23/2022, 2:37 PM  Clinical Narrative:        Patient is receiving a PEG today. Messaged with Dietitian, unsure if feeds at home will be bolus or by pump. Osmolite HN is recommended.  Called daughter to discuss agency  to pursue feedings etc. Voicemail full.   Discussed with [facility]. She is active with them. OT RN SLP ordered for home.  Supplies will need to be ordered when can speak with daughter and upon recommendation by dietitian.  Expected Discharge Plan and Servi  ces    Home with [facility] ( OT, RN, SLP)                                              Social Determinants of Health (SDOH) Interventions    Readmission Risk Interventions    05/28/2022    2:27 PM 03/13/2022    9:03 AM  Readmission Risk Prevention Plan  Transportation Screening Complete Complete  PCP or Specialist Appt within 5-7 Days  Complete  PCP or Specialist Appt within 3-5 Days Complete   Home Care Screening  Complete  Medication Review (RN CM)  Complete  HRI or Home Care Consult Complete   Social Work Consult for Recovery Care Planning/Counseling Complete   Palliative Care Screening Not Applicable   Medication Review Oceanographer) Complete

## 2022-08-23 NOTE — Progress Notes (Signed)
Dr. Arville Care was made aware that pt's glucose was 35; pt was asymptomatic. Pt received D50 70ml IV, and repeat glucose was 156. IVF was changed to D10 at 170ml/hr.

## 2022-08-23 NOTE — Procedures (Signed)
Cortrak  Tube Type:  Cortrak - 43 inches Tube Location:  Right nare Initial Placement:  Stomach Secured by: Bridle Technique Used to Measure Tube Placement:  Marking at nare/corner of mouth Cortrak Secured At:  65 cm   Cortrak Tube Team Note:  Consult received to place a Cortrak feeding tube.   X-ray is required, abdominal x-ray has been ordered by the Cortrak team. Please confirm tube placement before using the Cortrak tube.   If the tube becomes dislodged please keep the tube and contact the Cortrak team at [email or web address] for replacement.  If after hours and replacement cannot be delayed, place a NG tube and confirm placement with an abdominal x-ray.    Betsey Holiday MS, RD, LDN Please refer to [facility] for RD and/or RD on-call/weekend/after hours pager

## 2022-08-23 NOTE — Progress Notes (Signed)
PROGRESS NOTE   Cindy Brooks  [id number] DOB: 09/22/1971 DOA: 08/14/2022 PCP: Charlott Rakes, MD  Brief Narrative:  14 yr blk female largely bedbound since April Antiphospholipid AB with hypercoag state Chr dysphagia htn Bed-bound form prior CVA 01/22/22 Multiple subsequent admits 03/2022 for AMS, 04/2022 for UTI  Came to Ed with altered mental status on 08/15/22 Tachy 140, lactic 2.3, WBC 7, Sod 149 Admitted with likely aspiration pneumonia and severe sepsis and placed on broad-spectrum antibiotics initially  11/3 palliative care consulted 11/4 EEG shows encephalopathy with encephalopathy 11/6 pulmonary consulted for left lower lobe partial postobstructive collapse and mediastinal shift they recommended placing patient right lung down to facilitate pulmonary drainage--felt like she was going to be at high risk for aspiration regardless 11/7 neurology consulted secondary to stroke found on MRI  Hospital-Problem based course  Severe sepsis on admission secondary to aspiration pneumonia -Mucoid debris in left-sided airways left overall lower and left upper lobe - Completed 7-day course of antibiotics 11/7 - Discussed with family lung collapse postobstructive pneumonia etc. and feels she is not safe to eat based on speech therapy input   UTI from prior to admission likely secondary to indwelling Foley - Patient completed antibiotics  Hypoglycemia -Patient pulled out NG tube on 11/9- -still awaiting PEG, in the interim we will place core track and start feeds  Acute metabolic encephalopathy, underlying prior CVA Possible encephalopathy from multiple meds - Gabapentin and Cymbalta other meds all held - Neurology consulted and recommending Brilinta for 4 weeks then aspirin alone no further work-up - Metabolic work-up ammonia TSH B12 all normal -  await further improvement of mental state which has not occurred just as yet  Hyponatremia acute metabolic acidosis hypokalemia  hypomagnesemia - Metabolic derangements have resolved - Periodic labs  Anemia of chronic disease - Hemoglobin ranging from 7-8-monitor trends Lovenox to resume  Stage II right buttock pressure ulcer present from admission - Continue local wound care    DVT prophylaxis: Lovenox Code Status: Full Family Communication: Discussed with daughter Armando Reichert [phone] on phone-11/9 Disposition:  Status is: Inpatient Remains inpatient appropriate because:   Remains critically ill   Consultants:  Urology Pulmonology  Procedures: Multiple  Antimicrobials: Completed   Subjective:  Sleepy withdraws to pain otherwise not really interactive-note low blood sugars overnight into the 30s requiring interventions Reached out to IR but still wait on PEG tube therefore we will go ahead and asked nutritionist to place core track until PEG can be placed  Objective: Vitals:   08/23/22 0419 08/23/22 0753 08/23/22 1021 08/23/22 1200  BP:  (!) 122/92  (!) 140/94  Pulse:  (!) 101 85 99  Resp:  19  20  Temp:  98.6 F (37 C)  98.9 F (37.2 C)  TempSrc:  Axillary  Axillary  SpO2:  97%  96%  Weight: 53.3 kg     Height:        Intake/Output Summary (Last 24 hours) at 08/23/2022 1534 Last data filed at 08/23/2022 0418 Gross per 24 hour  Intake 853.69 ml  Output 1650 ml  Net -796.31 ml    Filed Weights   08/20/22 0500 08/22/22 0500 08/23/22 0419  Weight: 48 kg 56.2 kg 53.3 kg    Examination:  Sleepy hard to arouse but withdraws to pain No icterus no pallor S1-S2 no murmur Abdomen soft no rebound Cannot assess psych or neuro Data Reviewed: personally reviewed   CBC    Component Value Date/Time   WBC 7.1 08/23/2022 0749  RBC 2.64 (L) 08/23/2022 0749   HGB 7.4 (L) 08/23/2022 0749   HGB 11.1 (L) 06/14/2022 1040   HCT 21.4 (L) 08/23/2022 0749   PLT 249 08/23/2022 0749   PLT 244 06/14/2022 1040   MCV 81.1 08/23/2022 0749   MCH 28.0 08/23/2022 0749   MCHC 34.6  08/23/2022 0749   RDW 15.7 (H) 08/23/2022 0749   LYMPHSABS 1.4 08/21/2022 0543   MONOABS 0.8 08/21/2022 0543   EOSABS 0.1 08/21/2022 0543   BASOSABS 0.0 08/21/2022 0543      Latest Ref Rng & Units 08/23/2022    7:49 AM 08/21/2022    5:43 AM 08/20/2022    4:26 AM  CMP  Glucose 70 - 99 mg/dL 111  137  81   BUN 6 - 20 mg/dL <5  6  <5   Creatinine 0.44 - 1.00 mg/dL 0.73  0.74  0.75   Sodium 135 - 145 mmol/L 137  138  140   Potassium 3.5 - 5.1 mmol/L 3.7  3.5  3.6   Chloride 98 - 111 mmol/L 105  108  108   CO2 22 - 32 mmol/L 23  24  25    Calcium 8.9 - 10.3 mg/dL 8.5  8.1  8.4   Total Protein 6.5 - 8.1 g/dL 5.0     Total Bilirubin 0.3 - 1.2 mg/dL 0.2     Alkaline Phos 38 - 126 U/L 92     AST 15 - 41 U/L 22     ALT 0 - 44 U/L 14        Radiology Studies: CT ABDOMEN WO CONTRAST  Result Date: 08/23/2022 CLINICAL DATA:  Dysphagia.  History hypertension, stroke, UTI EXAM: CT ABDOMEN WITHOUT CONTRAST TECHNIQUE: Multidetector CT imaging of the abdomen was performed following the standard protocol without IV contrast. RADIATION DOSE REDUCTION: This exam was performed according to the departmental dose-optimization program which includes automated exposure control, adjustment of the mA and/or kV according to patient size and/or use of iterative reconstruction technique. COMPARISON:  05/25/2022 FINDINGS: Lower chest: Moderate pericardial effusion. Bibasilar pleural effusions and atelectasis. Hepatobiliary: Dependent density in gallbladder question layer of tiny calculi. Liver unremarkable. Pancreas: Normal appearance Spleen: Normal appearance Adrenals/Urinary Tract: Adrenal glands, kidneys, ureters, and bladder normal appearance Stomach/Bowel: Stomach incompletely distended with suboptimal assessment of gastric wall thickness. Visualized esophagus unremarkable. Large and small bowel loops normal appearance. Vascular/Lymphatic: Atherosclerotic calcification aorta without aneurysm. RIGHT retroperitoneal  phleboliths unchanged. No adenopathy. Other: No free air or free fluid.  No hernia. Musculoskeletal: Osseous structures unremarkable. IMPRESSION: Moderate pericardial effusion. Bibasilar pleural effusions and atelectasis. Dependent density in gallbladder question layer of tiny calculi. No acute intra-abdominal abnormalities. Aortic Atherosclerosis (ICD10-I70.0). Electronically Signed   By: Lavonia Dana M.D.   On: 08/23/2022 09:33     Scheduled Meds:  amantadine  100 mg Per Tube BID   aspirin  81 mg Per Tube Daily   atorvastatin  80 mg Per Tube Daily   capsaicin   Topical BID   Chlorhexidine Gluconate Cloth  6 each Topical Daily   folic acid  1 mg Per Tube Daily   free water  100 mL Per Tube Q6H   guaiFENesin  10 mL Per Tube BID   multivitamin  15 mL Per Tube Daily   scopolamine  1 patch Transdermal Q72H   thiamine (VITAMIN B1) injection  100 mg Intravenous Daily   ticagrelor  90 mg Per Tube BID   Continuous Infusions:  dextrose 100 mL/hr at 08/23/22 0541  dextrose 75 mL/hr at 08/23/22 0418   feeding supplement (OSMOLITE 1.2 CAL) Stopped (08/22/22 0700)     LOS: 8 days   Time spent: 65  Rhetta Mura, MD [facility] To contact the attending provider between 7A-7P or the covering provider during after hours 7P-7A, please log into the web site [email or web address] and access using universal [place] password for that web site. If you do not have the password, please call the hospital operator.  08/23/2022, 3:34 PM

## 2022-08-23 NOTE — Progress Notes (Signed)
SLP Cancellation Note  Patient Details Name: Cindy Brooks MRN: [id number] DOB: 1971/09/08   Cancelled treatment:       Reason Eval/Treat Not Completed: Medical issues which prohibited therapy (Pt NPO for G-tube placement with IR today. SLP will follow up on a subsequent date.)  Delissa Silba I. Vear Clock, MS, CCC-SLP Acute Rehabilitation Services Office number [phone]  Scheryl Marten 08/23/2022, 11:44 AM

## 2022-08-23 NOTE — Plan of Care (Signed)

## 2022-08-23 NOTE — Progress Notes (Signed)
Dr. Arville Care was made aware that pt's midnight's glucose was 66 and that pt was given D10 33ml IV per hypoglycemic protocol. Repeat glucose was 145. No new orders were given. Continue with current POC.

## 2022-08-23 NOTE — Progress Notes (Signed)
Nutrition Follow-up  DOCUMENTATION CODES:   Non-severe (moderate) malnutrition in context of chronic illness  INTERVENTION:   Tube feed via Cortrak: Start Osmolite 1.2 at 20 mL/hr and advance by 10 mL q6h to goal rate of 60 mL/hr (1440 mL per day) 100 mL free water flush q6h Provides 1728 kcal, 80 gm protein, and 1581 mL total free water daily.  Continue Multivitamin w/ minerals, Thiamine, and folic acid daily  NUTRITION DIAGNOSIS:   Moderate Malnutrition related to chronic illness, social / environmental circumstances (bedbound and nonverbal after TIA) as evidenced by mild muscle depletion, mild fat depletion. 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 Patient presented to the ED due to cough and worsening mental status. CXR showed unchanged cardiomegaly with small pleural effusion. UA was concerning for UTI. She was started on IV fluids and antibiotics. Palliative Care was consulted.  11/08 - NGT pulled  Plan for possible PEG placement, if PEG unable to be placed today then pt will need Cortrak.   Pt laying in bed, recently returned from CT. Discussed case with RN.   RD to follow for appropriateness and change to bolus feeds.   Medications reviewed and include: Folic Acid, MVI, Thiamine Labs reviewed: BUN <5, 24 hr CBGs 35-156  Diet Order:   Diet Order             Diet NPO time specified  Diet effective midnight                   EDUCATION NEEDS:   Not appropriate for education at this time  Skin:  Skin Assessment: Skin Integrity Issues: Skin Integrity Issues:: Stage  II Stage II: R buttocks  Last BM:  11/9 - Type 7  Height:  Ht Readings from Last 1 Encounters:  08/15/22 5\' 4"  (1.626 m)   Weight:  Wt Readings from Last 1 Encounters:  08/23/22 53.3 kg   Ideal Body Weight:  54.54 kg  BMI:  Body mass index is 20.17 kg/m.  Estimated Nutritional Needs:  Kcal:  1650-1850 kcal Protein:  80-95 grams Fluid:  >/= 1.8 L/day    13/10/23 RD, LDN Clinical Dietitian See [facility] for contact information.

## 2022-08-24 DIAGNOSIS — J159 Unspecified bacterial pneumonia: Secondary | ICD-10-CM | POA: Diagnosis not present

## 2022-08-24 LAB — GLUCOSE, CAPILLARY
Glucose-Capillary: 112 mg/dL — ABNORMAL HIGH (ref 70–99)
Glucose-Capillary: 121 mg/dL — ABNORMAL HIGH (ref 70–99)
Glucose-Capillary: 139 mg/dL — ABNORMAL HIGH (ref 70–99)
Glucose-Capillary: 144 mg/dL — ABNORMAL HIGH (ref 70–99)
Glucose-Capillary: 96 mg/dL (ref 70–99)
Glucose-Capillary: 97 mg/dL (ref 70–99)

## 2022-08-24 LAB — COMPREHENSIVE METABOLIC PANEL
ALT: 17 U/L (ref 0–44)
AST: 23 U/L (ref 15–41)
Albumin: 2.3 g/dL — ABNORMAL LOW (ref 3.5–5.0)
Alkaline Phosphatase: 88 U/L (ref 38–126)
Anion gap: 11 (ref 5–15)
BUN: <5 mg/dL — ABNORMAL LOW (ref 6–20)
CO2: 24 mmol/L (ref 22–32)
Calcium: 8.7 mg/dL — ABNORMAL LOW (ref 8.9–10.3)
Chloride: 102 mmol/L (ref 98–111)
Creatinine, Ser: 0.76 mg/dL (ref 0.44–1.00)
GFR, Estimated: >60 mL/min (ref >60)
Glucose, Bld: 120 mg/dL — ABNORMAL HIGH (ref 70–99)
Potassium: 3.8 mmol/L (ref 3.5–5.1)
Sodium: 137 mmol/L (ref 135–145)
Total Bilirubin: <0.1 mg/dL — ABNORMAL LOW (ref 0.3–1.2)
Total Protein: 5.4 g/dL — ABNORMAL LOW (ref 6.5–8.1)

## 2022-08-24 LAB — CBC
HCT: 22.1 % — ABNORMAL LOW (ref 36.0–46.0)
Hemoglobin: 7.6 g/dL — ABNORMAL LOW (ref 12.0–15.0)
MCH: 28.5 pg (ref 26.0–34.0)
MCHC: 34.4 g/dL (ref 30.0–36.0)
MCV: 82.8 fL (ref 80.0–100.0)
Platelets: 270 10*3/uL (ref 150–400)
RBC: 2.67 MIL/uL — ABNORMAL LOW (ref 3.87–5.11)
RDW: 16.3 % — ABNORMAL HIGH (ref 11.5–15.5)
WBC: 7.5 10*3/uL (ref 4.0–10.5)
nRBC: 0 % (ref 0.0–0.2)

## 2022-08-24 NOTE — Consult Note (Addendum)
Chief Complaint: Patient was seen in consultation today for percutaneous gastric tube placement Chief Complaint  Patient presents with   Cough   at the request of Dr Mahala Menghini  Supervising Physician: Irish Lack  Patient Status: [facility] - In-pt  History of Present Illness: Cindy Brooks is a 51 y.o. female   CVA 01/2022; multiple admits since CVA for AMS Bedbound with buttock ulcer Encephalopathy Pulled out NG Aspiration risk  Dysphagia; deconditioning Need for long term care  Taking ASA/Brilinta LD ASA 11/11; LD Brilinta 11/10  Request for percutaneous G tube in IR Dr Fredia Sorrow approves procedure  Past Medical History:  Diagnosis Date   Hypertension    Hypertensive urgency 01/20/2022   Seizure (HCC) 01/28/2022   Stroke (HCC)    TIA (transient ischemic attack) 2019   Urinary tract infection 01/28/2022    Past Surgical History:  Procedure Laterality Date   ESOPHAGOGASTRODUODENOSCOPY N/A 01/28/2022   Procedure: ESOPHAGOGASTRODUODENOSCOPY (EGD);  Surgeon: Diamantina Monks, MD;  Location: [facility] ENDOSCOPY;  Service: General;  Laterality: N/A;   PEG PLACEMENT N/A 01/28/2022   Procedure: PERCUTANEOUS ENDOSCOPIC GASTROSTOMY (PEG) PLACEMENT;  Surgeon: Diamantina Monks, MD;  Location: [facility] ENDOSCOPY;  Service: General;  Laterality: N/A;    Allergies: Aspirin, Codeine, Penicillins, and Strawberry extract  Medications: Prior to Admission medications   Medication Sig Start Date End Date Taking? Authorizing Provider  acetaminophen (TYLENOL) 325 MG tablet Take 2 tablets (650 mg total) by mouth every 4 (four) hours as needed. Patient taking differently: Take 650 mg by mouth every 4 (four) hours as needed for mild pain. 03/21/22 03/21/23 Yes Almon Hercules, MD  atorvastatin (LIPITOR) 80 MG tablet Take 1 tablet (80 mg total) by mouth daily. 05/14/22  Yes Hoy Register, MD  chlorhexidine (PERIDEX) 0.12 % solution Use as directed 15 mLs in the mouth or throat 2 (two) times daily. 05/28/22   Yes Alwyn Ren, MD  DULoxetine (CYMBALTA) 60 MG capsule Take 1 capsule (60 mg total) by mouth daily. 05/14/22  Yes Newlin, Odette Horns, MD  Ensure Plus (ENSURE PLUS) LIQD Take 237 mLs by mouth 3 (three) times daily between meals.   Yes [provider]  folic acid (FOLVITE) 1 MG tablet Take 1 tablet (1 mg total) by mouth daily. 03/21/22  Yes Almon Hercules, MD  gabapentin (NEURONTIN) 100 MG capsule Take 1 capsule (100 mg total) by mouth 2 (two) times daily. 05/14/22  Yes Hoy Register, MD  hydrALAZINE (APRESOLINE) 50 MG tablet Take 50 mg by mouth every 8 (eight) hours. 07/06/22  Yes [provider]  ipratropium-albuterol (DUONEB) 0.5-2.5 (3) MG/3ML SOLN Take 3 mLs by nebulization every 6 (six) hours as needed. Patient taking differently: Take 3 mLs by nebulization every 6 (six) hours as needed (SOB). 03/21/22  Yes Almon Hercules, MD  Multiple Vitamin (MULTIVITAMIN WITH MINERALS) TABS tablet Take 1 tablet by mouth daily. 03/21/22  Yes Almon Hercules, MD  Nystatin (GERHARDT'S BUTT CREAM) CREA Apply 1 application. topically daily as needed for irritation. 03/21/22  Yes Almon Hercules, MD  nystatin (MYCOSTATIN) 100000 UNIT/ML suspension Take 5 mLs (500,000 Units total) by mouth 4 (four) times daily. 05/28/22  Yes Alwyn Ren, MD  propranolol (INDERAL) 20 MG tablet Take 20 mg by mouth 3 (three) times daily.   Yes [provider]  senna-docusate (SENOKOT-S) 8.6-50 MG tablet Take 1 tablet by mouth 2 (two) times daily between meals as needed for mild constipation. 03/21/22  Yes Almon Hercules, MD  thiamine  100 MG tablet Take 1 tablet (100 mg total) by mouth daily. 03/21/22  Yes Almon HerculesGonfa, Taye T, MD  amantadine (SYMMETREL) 100 MG capsule Take 1 capsule (100 mg total) by mouth 2 (two) times daily. Patient not taking: Reported on 08/15/2022 03/21/22 08/15/22  Almon HerculesGonfa, Taye T, MD  clopidogrel (PLAVIX) 75 MG tablet Take 1 tablet (75 mg total) by mouth daily. 06/24/22   Hoy RegisterNewlin, Enobong, MD  Misc.  Devices MISC Urinalysis, urine culture and sensitivity 07/29/22   Hoy RegisterNewlin, Enobong, MD  polyethylene glycol powder (MIRALAX) 17 GM/SCOOP powder Take 17 g by mouth 2 (two) times daily as needed for moderate constipation. Patient not taking: Reported on 06/14/2022 03/21/22   Almon HerculesGonfa, Taye T, MD  propranolol (INDERAL) 10 MG tablet Take 1 tablet (10 mg total) by mouth 3 (three) times daily. Patient not taking: Reported on 08/15/2022 06/24/22   Hoy RegisterNewlin, Enobong, MD  sulfamethoxazole-trimethoprim (BACTRIM DS) 800-160 MG tablet Take 1 tablet by mouth 2 (two) times daily. Patient not taking: Reported on 08/15/2022 08/05/22   [provider]     Family History  Problem Relation Age of Onset   Diabetes Mellitus II Maternal Grandmother     Social History   Socioeconomic History   Marital status: Single    Spouse name: Not on file   Number of children: Not on file   Years of education: Not on file   Highest education level: Not on file  Occupational History   Not on file  Tobacco Use   Smoking status: Never   Smokeless tobacco: Never  Vaping Use   Vaping Use: Never used  Substance and Sexual Activity   Alcohol use: Not Currently    Alcohol/week: 203.0 standard drinks of alcohol    Types: 84 Cans of beer, 119 Shots of liquor per week    Comment: 12pk/day and a fifth/day   Drug use: Not on file   Sexual activity: Not Currently  Other Topics Concern   Not on file  Social History Narrative   Not on file   Social Determinants of Health   Financial Resource Strain: Not on file  Food Insecurity: Not on file  Transportation Needs: Not on file  Physical Activity: Not on file  Stress: Not on file  Social Connections: Not on file   Review of Systems: A 12 point ROS discussed and pertinent positives are indicated in the HPI above.  All other systems are negative.    Vital Signs: BP (!) 120/91 (BP Location: Right Arm)   Pulse 100   Temp 98 F (36.7 C)   Resp 19   Ht 5\' 4"  (1.626 m)    Wt 117 lb 8.1 oz (53.3 kg)   LMP  (LMP Unknown)   SpO2 100%   BMI 20.17 kg/m     Physical Exam Vitals reviewed.  Constitutional:      Appearance: She is ill-appearing.  HENT:     Mouth/Throat:     Mouth: Mucous membranes are moist.  Cardiovascular:     Rate and Rhythm: Normal rate.     Heart sounds: Normal heart sounds.  Pulmonary:     Effort: Pulmonary effort is normal.     Breath sounds: Wheezing present.  Abdominal:     Tenderness: There is no abdominal tenderness.  Musculoskeletal:     Comments: Does not follow commands  Skin:    General: Skin is warm.  Neurological:     Comments: No response   Psychiatric:     Comments: Spoke  to [facility] via phone She consents to procedure     Imaging: DG Abd Portable 1V  Result Date: 08/23/2022 CLINICAL DATA:  Feeding tube placement EXAM: PORTABLE ABDOMEN - 1 VIEW COMPARISON:  08/21/2022 FINDINGS: Non weighted enteric feeding tube is positioned with tip and side port below the diaphragm, tip at the right aspect of the lumbar column in the vicinity of the duodenal bulb. Nonobstructive pattern of included bowel gas. IMPRESSION: Non weighted enteric feeding tube is positioned with tip and side port below the diaphragm, tip at the right aspect of the lumbar column in the vicinity of the duodenal bulb. Electronically Signed   By: Jearld Lesch M.D.   On: 08/23/2022 17:12   CT ABDOMEN WO CONTRAST  Result Date: 08/23/2022 CLINICAL DATA:  Dysphagia.  History hypertension, stroke, UTI EXAM: CT ABDOMEN WITHOUT CONTRAST TECHNIQUE: Multidetector CT imaging of the abdomen was performed following the standard protocol without IV contrast. RADIATION DOSE REDUCTION: This exam was performed according to the departmental dose-optimization program which includes automated exposure control, adjustment of the mA and/or kV according to patient size and/or use of iterative reconstruction technique. COMPARISON:  05/25/2022 FINDINGS: Lower chest:  Moderate pericardial effusion. Bibasilar pleural effusions and atelectasis. Hepatobiliary: Dependent density in gallbladder question layer of tiny calculi. Liver unremarkable. Pancreas: Normal appearance Spleen: Normal appearance Adrenals/Urinary Tract: Adrenal glands, kidneys, ureters, and bladder normal appearance Stomach/Bowel: Stomach incompletely distended with suboptimal assessment of gastric wall thickness. Visualized esophagus unremarkable. Large and small bowel loops normal appearance. Vascular/Lymphatic: Atherosclerotic calcification aorta without aneurysm. RIGHT retroperitoneal phleboliths unchanged. No adenopathy. Other: No free air or free fluid.  No hernia. Musculoskeletal: Osseous structures unremarkable. IMPRESSION: Moderate pericardial effusion. Bibasilar pleural effusions and atelectasis. Dependent density in gallbladder question layer of tiny calculi. No acute intra-abdominal abnormalities. Aortic Atherosclerosis (ICD10-I70.0). Electronically Signed   By: Ulyses Southward M.D.   On: 08/23/2022 09:33   DG Abd Portable 1V  Result Date: 08/21/2022 CLINICAL DATA:  NG tube placement. EXAM: PORTABLE ABDOMEN - 1 VIEW COMPARISON:  08/20/2022 FINDINGS: The NG tube tip is in stable position with its tip in the antropyloric region of the stomach. IMPRESSION: NG tube tip in the antropyloric region of the stomach. Electronically Signed   By: Rudie Meyer M.D.   On: 08/21/2022 09:31   DG CHEST PORT 1 VIEW  Result Date: 08/20/2022 CLINICAL DATA:  Cough, pneumonia EXAM: PORTABLE CHEST 1 VIEW COMPARISON:  08/14/2022 chest radiograph. FINDINGS: Enteric tube enters the stomach with the tip seen in the right abdomen in the region of the pylorus. Stable cardiomediastinal silhouette with normal heart size. No pneumothorax. No pleural effusions. No pulmonary edema. Hazy left retrocardiac opacity is unchanged. IMPRESSION: 1. Enteric tube enters the stomach with the tip seen in the right abdomen in the region of the  pylorus. 2. Stable hazy left retrocardiac opacity, favor atelectasis. Electronically Signed   By: Delbert Phenix M.D.   On: 08/20/2022 08:45   DG Abd 1 View  Result Date: 08/20/2022 CLINICAL DATA:  NG tube placement EXAM: ABDOMEN - 1 VIEW COMPARISON:  None Available. FINDINGS: The NG tube side port and distal tip project over the stomach. The bowel gas pattern is normal. No radio-opaque calculi or other significant radiographic abnormality are seen. Visualized bibasilar lungs are clear. IMPRESSION: Adequate positioning of the NG tube, with side port and distal tip in the stomach. Electronically Signed   By: Jacob Moores M.D.   On: 08/20/2022 08:35   MR BRAIN WO CONTRAST  Result Date: 08/20/2022 CLINICAL DATA:  Delirium EXAM: MRI HEAD WITHOUT CONTRAST TECHNIQUE: Multiplanar, multiecho pulse sequences of the brain and surrounding structures were obtained without intravenous contrast. COMPARISON:  03/23/2022 FINDINGS: Brain: Increased signal on diffusion-weighted imaging in the right corona radiata (series 9, image 87), without ADC correlate, likely subacute infarcts with normalization of the ADC. These areas are new compared to 03/23/2022. Additional more punctate foci of increased signal on diffusion-weighted imaging in the right frontal lobe, without ADC correlates (series 9, image 92 and 93), are also favored to represent subacute infarcts. No acute hemorrhage, mass, mass effect, or midline shift. No hydrocephalus or extra-axial collection. 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 Remote lacunar infarcts in the bilateral basal ganglia, bilateral thalami, and right corona radiata T2 hyperintense signal in the periventricular white matter, likely the sequela of mild-to-moderate chronic small vessel ischemic disease. Vascular: Normal arterial flow voids. Skull and upper cervical spine: Normal marrow signal.  Sinuses/Orbits: Dysconjugate gaze. Mucosal thickening in the left maxillary sinus. Other: Trace fluid in left mastoid air cells. IMPRESSION: Subacute infarcts in the right frontal lobe. No acute infarct or hemorrhage. Electronically Signed   By: Wiliam Ke M.D.   On: 08/20/2022 03:10   EEG adult  Result Date: 08/17/2022 Rejeana Brock, MD     08/17/2022  7:26 PM History: 51 year old female being evaluated for encephalopathy Sedation: None Technique: This EEG was acquired with electrodes placed according to the International 10-20 electrode system (including Fp1, Fp2, F3, F4, C3, C4, P3, P4, O1, O2, T3, T4, T5, T6, A1, A2, Fz, Cz, Pz). The following electrodes were missing or displaced: none. Background: There is a posterior dominant rhythm of 7 to 8 Hz that is seen at times.  Muscle artifact obscures much of the recording, but when the waking background is visible in addition there is generalized irregular delta and theta activities seen in the waking background. Photic stimulation: Physiologic driving is not performed EEG Abnormalities: 1) generalized irregular slow activity 2) slow posterior dominant rhythm Clinical Interpretation: This EEG is consistent with a generalized nonspecific cerebral dysfunction (encephalopathy). There was no seizure or seizure predisposition recorded on this study. Please note that lack of epileptiform activity on EEG does not preclude the possibility of epilepsy. Ritta Slot, MD [facility] [phone] If 7pm- 7am, please page neurology on call as listed in AMION.   DG Abd 1 View  Result Date: 08/17/2022 CLINICAL DATA:  NG tube placement. EXAM: ABDOMEN - 1 VIEW COMPARISON:  05/12/2022 FINDINGS: An enteric tube is noted with tip overlying the mid stomach. The bowel gas pattern is unremarkable. IMPRESSION: Enteric tube with tip overlying the mid stomach. Electronically Signed   By: Harmon Pier M.D.   On: 08/17/2022 16:57   CT HEAD WO CONTRAST  ( )  Result Date: 08/17/2022 CLINICAL DATA:  Initial evaluation for delirium. EXAM: CT HEAD WITHOUT CONTRAST TECHNIQUE: Contiguous axial images were obtained from the base of the skull through the vertex without intravenous contrast. RADIATION DOSE REDUCTION: This exam was performed according to the departmental dose-optimization program which includes automated exposure control, adjustment of the mA and/or kV according to patient size and/or use of iterative reconstruction technique. COMPARISON:  Prior CT from 08/04/2022. FINDINGS: Brain: Examination technically limited by positioning. Cerebral volume within normal limits. Scattered patchy hypodensity involving the supratentorial cerebral white matter, most consistent with chronic small vessel ischemic disease. Remote lacunar infarct noted at the left basal ganglia. No  acute intracranial hemorrhage. No acute large vessel territory infarct. No mass lesion, mass effect or midline shift. No hydrocephalus or extra-axial fluid collection. Vascular: No hyperdense vessel. Skull: Scalp soft tissues and calvarium demonstrate no acute finding. Sinuses/Orbits: There is apparent curvilinear fluid signal intensity at the posterior/superior aspect of the right globe (series 6, image 17), of uncertain significance. Globes and orbital soft tissues otherwise unremarkable. Visualized paranasal sinuses are largely clear. No significant mastoid effusion. Other: None. IMPRESSION: 1. No acute intracranial abnormality. 2. Chronic small vessel ischemic disease with remote lacunar infarct at the left basal ganglia. 3. Curvilinear fluid signal intensity at the posterior/superior aspect of the right globe as above, of uncertain significance. Correlation with physical exam recommended. Electronically Signed   By: Rise Mu M.D.   On: 08/17/2022 02:28   CT Angio Chest Pulmonary Embolism (PE) W or WO Contrast  Result Date: 08/15/2022 CLINICAL DATA:  Shortness of breath,  immobility EXAM: CT ANGIOGRAPHY CHEST WITH CONTRAST TECHNIQUE: Multidetector CT imaging of the chest was performed using the standard protocol during bolus administration of intravenous contrast. Multiplanar CT image reconstructions and MIPs were obtained to evaluate the vascular anatomy. RADIATION DOSE REDUCTION: This exam was performed according to the departmental dose-optimization program which includes automated exposure control, adjustment of the mA and/or kV according to patient size and/or use of iterative reconstruction technique. CONTRAST:  60mL OMNIPAQUE IOHEXOL 350 MG/ML SOLN COMPARISON:  Chest radiograph 1 day prior, CTA chest 03/23/2022 FINDINGS: Cardiovascular: There is adequate opacification of the pulmonary arteries. There is no evidence of pulmonary embolism. The heart size is normal. There is a small pericardial effusion. The thoracic aorta is normal. Mediastinum/Nodes: There is significant leftward mediastinal shift due to volume loss in the left hemithorax. The thyroid is unremarkable. The esophagus is grossly unremarkable. There is no mediastinal, hilar, or axillary lymphadenopathy. Lungs/Pleura: The trachea is patent. There is mucoid debris in the left-sided airways with near-complete postobstructive collapse of the left lower lobe and partial postobstructive collapse of the left upper lobe, with significant associated volume loss and leftward mediastinal shift. The right airways are patent with mild central bronchial wall thickening. There is no focal airspace disease on the right. There is no pleural effusion or pneumothorax. There are no definite suspicious nodules, though note that the left lower lobe is not well evaluated Upper Abdomen: The imaged portions of the upper abdominal viscera are unremarkable. Musculoskeletal: There is no acute osseous abnormality or suspicious osseous lesion. Review of the MIP images confirms the above findings. IMPRESSION: 1. No evidence of pulmonary  embolism. 2. Mucoid debris in the left-sided airways with near-complete postobstructive collapse of the left lower lobe and partial postobstructive collapse of the left upper lobe, with significant associated volume loss and leftward mediastinal shift. 3. Small pericardial effusion. Electronically Signed   By: Lesia Hausen M.D.   On: 08/15/2022 09:58   DG Chest Port 1 View  Result Date: 08/14/2022 CLINICAL DATA:  Questionable sepsis EXAM: PORTABLE CHEST 1 VIEW COMPARISON:  Chest x-ray 08/04/2022. FINDINGS: Cardiac silhouette is enlarged, unchanged. Blunting of the left costophrenic angle and retrocardiac opacities persist. The right lung is clear. No pneumothorax or acute fracture. IMPRESSION: Unchanged cardiomegaly with small left pleural effusion and retrocardiac atelectasis versus consolidation. Electronically Signed   By: Darliss Cheney M.D.   On: 08/14/2022 23:44   DG CHEST PORT 1 VIEW  Result Date: 08/04/2022 CLINICAL DATA:  Altered mental status EXAM: PORTABLE CHEST 1 VIEW COMPARISON:  Chest radiograph dated 05/25/2022 FINDINGS:  Low lung volumes. Left retrocardiac opacities. No pneumothorax. No layering pleural effusion. Left heart border is obscured. The visualized skeletal structures are unremarkable. IMPRESSION: Left retrocardiac opacities may represent atelectasis, aspiration, or pneumonia. Electronically Signed   By: Agustin Cree M.D.   On: 08/04/2022 16:12   CT Head Wo Contrast  Result Date: 08/04/2022 CLINICAL DATA:  Delirium ams EXAM: CT HEAD WITHOUT CONTRAST TECHNIQUE: Contiguous axial images were obtained from the base of the skull through the vertex without intravenous contrast. RADIATION DOSE REDUCTION: This exam was performed according to the departmental dose-optimization program which includes automated exposure control, adjustment of the mA and/or kV according to patient size and/or use of iterative reconstruction technique. COMPARISON:  CT head 05/26/2022 BRAIN: BRAIN Patchy and  confluent areas of decreased attenuation are noted throughout the deep and periventricular white matter of the cerebral hemispheres bilaterally, compatible with chronic microvascular ischemic disease. Bilateral chronic basal ganglia and thalamic lacunar infarctions. No evidence of large-territorial acute infarction. No parenchymal hemorrhage. No mass lesion. No extra-axial collection. Cavum septum vergae variation. No mass effect or midline shift. No hydrocephalus. Basilar cisterns are patent. Vascular: No hyperdense vessel. Skull: No acute fracture or focal lesion. Sinuses/Orbits: Paranasal sinuses and mastoid air cells are clear. The orbits are unremarkable. Other: None. IMPRESSION: No acute intracranial abnormality. Electronically Signed   By: Tish Frederickson M.D.   On: 08/04/2022 16:05    Labs:  CBC: Recent Labs    08/20/22 0759 08/21/22 0543 08/23/22 0749 08/24/22 0131  WBC 6.3 6.3 7.1 7.5  HGB 8.5* 7.3* 7.4* 7.6*  HCT 26.2* 22.4* 21.4* 22.1*  PLT 183 199 249 270    COAGS: Recent Labs    01/20/22 1219 03/23/22 1654 04/19/22 1210 08/15/22 0035  INR 1.2 1.2 1.2 1.1  APTT 29  --  28 28    BMP: Recent Labs    08/20/22 0426 08/21/22 0543 08/23/22 0749 08/24/22 0131  NA 140 138 137 137  K 3.6 3.5 3.7 3.8  CL 108 108 105 102  CO2 GLUCOSE 81 137* 111* 120*  BUN <5* 6 <5* <5*  CALCIUM 8.4* 8.1* 8.5* 8.7*  CREATININE 0.75 0.74 0.73 0.76  GFRNONAA >60 >60 >60 >60    LIVER FUNCTION TESTS: Recent Labs    08/15/22 0242 08/17/22 1029 08/23/22 0749 08/24/22 0131  BILITOT 1.0 0.6 0.2* <0.1*  AST 16 14* 22 23  ALT ALKPHOS 60 58 92 88  PROT 5.6* 4.7* 5.0* 5.4*  ALBUMIN 2.9* 2.1* 2.1* 2.3*    TUMOR MARKERS: No results for input(s): "AFPTM", "CEA", "CA199", "CHROMGRNA" in the last 8760 hours.  Assessment and Plan:  CVA AMS Aspiration Dysphagia Deconditioning Need for long term care Scheduled for Percutaneous Gastric tube 11/16 (odd LD  ASA/Brilinta 5 days) Risks and benefits image guided gastrostomy tube placement was discussed with the patient's Dtr [name] via phone including, but not limited to the need for a barium enema during the procedure, bleeding, infection, peritonitis and/or damage to adjacent structures.  All questions were answered, Dtr [name] is agreeable to proceed.  Consent signed and in chart.  Thank you for this interesting consult.  I greatly enjoyed meeting Ricca Melgarejo and look forward to participating in their care.  A copy of this report was sent to the requesting provider on this date.  Electronically Signed: Robet Leu, PA-C 08/24/2022, 11:48 AM   I spent a total of 20 Minutes    in face  to face in clinical consultation, greater than 50% of which was counseling/coordinating care for percutaneous gastric tube placement

## 2022-08-24 NOTE — Progress Notes (Signed)
PROGRESS NOTE   Cindy Brooks  [id number] DOB: 10/29/1970 DOA: 08/14/2022 PCP: Charlott Rakes, MD  Brief Narrative:  50 yr blk female largely bedbound since April Antiphospholipid AB with hypercoag state Chr dysphagia htn Bed-bound form prior CVA 01/22/22 Multiple subsequent admits 03/2022 for AMS, 04/2022 for UTI  Came to Ed with altered mental status on 08/15/22 Tachy 140, lactic 2.3, WBC 7, Sod 149 Admitted with likely aspiration pneumonia and severe sepsis and placed on broad-spectrum antibiotics initially  11/3 palliative care consulted 11/4 EEG shows encephalopathy with encephalopathy 11/6 pulmonary consulted for left lower lobe partial postobstructive collapse and mediastinal shift they recommended placing patient right lung down to facilitate pulmonary drainage--felt like she was going to be at high risk for aspiration regardless 11/7 neurology consulted secondary to stroke found on MRI 11/10 core track placed feeds resumed as NG abdomen postoperative times  Hospital-Problem based course  Severe sepsis on admission secondary to aspiration pneumonia - CT on 11/2 showed mucoid debris in left-sided airways left overall lower and left upper lobe - Completed 7-day course of antibiotics 11/7 - Discussed with family lung collapse postobstructive pneumonia etc. and feels she is not safe to eat based on speech therapy input - Periodic chest x-ray as needed if hypoxic  UTI from prior to admission likely secondary to indwelling Foley - Patient completed antibiotics  Hypoglycemia -Patient pulled out NG tube on 11/9- -core track placed 11/10-still not at goal feeds so continuing D5 but at 40 cc/H - Monitor trends  Acute metabolic encephalopathy, underlying prior CVA Possible encephalopathy from multiple meds - Gabapentin and Cymbalta other meds all held - Neurology consulted and recommending Brilinta for 4 weeks then aspirin alone no further work-up - Metabolic work-up ammonia  TSH B12 all normal - Patient is more interactive but seems to be unable to verbalize etc.-I think this is her new baseline  Hyponatremia acute metabolic acidosis hypokalemia hypomagnesemia - Metabolic derangements have resolved - Periodic labs  Anemia of chronic disease - Hemoglobin ranging from 7-8-monitor trends Lovenox to resume  Stage II right buttock pressure ulcer present from admission - Continue local wound care    DVT prophylaxis: Lovenox Code Status: Full Family Communication: Discussed with daughter Armando Reichert [phone] on phone-11/9 Disposition:  Status is: Inpatient Remains inpatient appropriate because:   Remains critically ill   Consultants:  Urology Pulmonology  Procedures: Multiple  Antimicrobials: Completed   Subjective:  She is more interactive today looks like hypoglycemia has resolved I am unable to get an adequate ROS  Objective: Vitals:   08/24/22 0415 08/24/22 0500 08/24/22 0800 08/24/22 0900  BP: (!) 147/91  122/85 (!) 120/91  Pulse: 100  (!) 115 100  Resp: 18  18 19   Temp: 98.7 F (37.1 C)  98.3 F (36.8 C) 98 F (36.7 C)  TempSrc: Oral  Axillary   SpO2: 100%  100% 100%  Weight:  53.3 kg    Height:        Intake/Output Summary (Last 24 hours) at 08/24/2022 0936 Last data filed at 08/24/2022 0323 Gross per 24 hour  Intake 3980.85 ml  Output 3600 ml  Net 380.85 ml    Filed Weights   08/22/22 0500 08/23/22 0419 08/24/22 0500  Weight: 56.2 kg 53.3 kg 53.3 kg    Examination:  Awakens fair without distress No icterus no pallor, core track in place S1-S2 no murmur Chest seems clear no rales rhonchi Abdomen soft no rebound Cannot assess psych or neuro fully given level of interactivity  Data Reviewed: personally reviewed   CBC    Component Value Date/Time   WBC 7.5 08/24/2022 0131   RBC 2.67 (L) 08/24/2022 0131   HGB 7.6 (L) 08/24/2022 0131   HGB 11.1 (L) 06/14/2022 1040   HCT 22.1 (L) 08/24/2022 0131   PLT  270 08/24/2022 0131   PLT 244 06/14/2022 1040   MCV 82.8 08/24/2022 0131   MCH 28.5 08/24/2022 0131   MCHC 34.4 08/24/2022 0131   RDW 16.3 (H) 08/24/2022 0131   LYMPHSABS 1.4 08/21/2022 0543   MONOABS 0.8 08/21/2022 0543   EOSABS 0.1 08/21/2022 0543   BASOSABS 0.0 08/21/2022 0543      Latest Ref Rng & Units 08/24/2022    1:31 AM 08/23/2022    7:49 AM 08/21/2022    5:43 AM  CMP  Glucose 70 - 99 mg/dL 120  111  137   BUN 6 - 20 mg/dL <5  <5  6   Creatinine 0.44 - 1.00 mg/dL 0.76  0.73  0.74   Sodium 135 - 145 mmol/L 137  137  138   Potassium 3.5 - 5.1 mmol/L 3.8  3.7  3.5   Chloride 98 - 111 mmol/L 102  105  108   CO2 22 - 32 mmol/L 24  23  24    Calcium 8.9 - 10.3 mg/dL 8.7  8.5  8.1   Total Protein 6.5 - 8.1 g/dL 5.4  5.0    Total Bilirubin 0.3 - 1.2 mg/dL <0.1  0.2    Alkaline Phos 38 - 126 U/L 88  92    AST 15 - 41 U/L 23  22    ALT 0 - 44 U/L 17  14       Radiology Studies: DG Abd Portable 1V  Result Date: 08/23/2022 CLINICAL DATA:  Feeding tube placement EXAM: PORTABLE ABDOMEN - 1 VIEW COMPARISON:  08/21/2022 FINDINGS: Non weighted enteric feeding tube is positioned with tip and side port below the diaphragm, tip at the right aspect of the lumbar column in the vicinity of the duodenal bulb. Nonobstructive pattern of included bowel gas. IMPRESSION: Non weighted enteric feeding tube is positioned with tip and side port below the diaphragm, tip at the right aspect of the lumbar column in the vicinity of the duodenal bulb. Electronically Signed   By: Delanna Ahmadi M.D.   On: 08/23/2022 17:12   CT ABDOMEN WO CONTRAST  Result Date: 08/23/2022 CLINICAL DATA:  Dysphagia.  History hypertension, stroke, UTI EXAM: CT ABDOMEN WITHOUT CONTRAST TECHNIQUE: Multidetector CT imaging of the abdomen was performed following the standard protocol without IV contrast. RADIATION DOSE REDUCTION: This exam was performed according to the departmental dose-optimization program which includes  automated exposure control, adjustment of the mA and/or kV according to patient size and/or use of iterative reconstruction technique. COMPARISON:  05/25/2022 FINDINGS: Lower chest: Moderate pericardial effusion. Bibasilar pleural effusions and atelectasis. Hepatobiliary: Dependent density in gallbladder question layer of tiny calculi. Liver unremarkable. Pancreas: Normal appearance Spleen: Normal appearance Adrenals/Urinary Tract: Adrenal glands, kidneys, ureters, and bladder normal appearance Stomach/Bowel: Stomach incompletely distended with suboptimal assessment of gastric wall thickness. Visualized esophagus unremarkable. Large and small bowel loops normal appearance. Vascular/Lymphatic: Atherosclerotic calcification aorta without aneurysm. RIGHT retroperitoneal phleboliths unchanged. No adenopathy. Other: No free air or free fluid.  No hernia. Musculoskeletal: Osseous structures unremarkable. IMPRESSION: Moderate pericardial effusion. Bibasilar pleural effusions and atelectasis. Dependent density in gallbladder question layer of tiny calculi. No acute intra-abdominal abnormalities. Aortic Atherosclerosis (ICD10-I70.0). Electronically Signed   By:  Ulyses Southward M.D.   On: 08/23/2022 09:33     Scheduled Meds:  amantadine  100 mg Per Tube BID   aspirin  81 mg Per Tube Daily   atorvastatin  80 mg Per Tube Daily   capsaicin   Topical BID   Chlorhexidine Gluconate Cloth  6 each Topical Daily   folic acid  1 mg Per Tube Daily   free water  100 mL Per Tube Q6H   guaiFENesin  10 mL Per Tube BID   multivitamin  15 mL Per Tube Daily   scopolamine  1 patch Transdermal Q72H   thiamine (VITAMIN B1) injection  100 mg Intravenous Daily   ticagrelor  90 mg Per Tube BID   Continuous Infusions:  dextrose 100 mL/hr at 08/24/22 0323   dextrose 75 mL/hr at 08/23/22 0418   feeding supplement (OSMOLITE 1.2 CAL) 60 mL/hr at 08/24/22 0323     LOS: 9 days   Time spent: 29  Rhetta Mura, MD [facility] To contact the attending provider between 7A-7P or the covering provider during after hours 7P-7A, please log into the web site [email or web address] and access using universal [place] password for that web site. If you do not have the password, please call the hospital operator.  08/24/2022, 9:36 AM

## 2022-08-24 NOTE — Plan of Care (Signed)

## 2022-08-25 ENCOUNTER — Inpatient Hospital Stay (HOSPITAL_COMMUNITY): Payer: No Typology Code available for payment source

## 2022-08-25 DIAGNOSIS — J159 Unspecified bacterial pneumonia: Secondary | ICD-10-CM | POA: Diagnosis not present

## 2022-08-25 LAB — CBC
HCT: 21.8 % — ABNORMAL LOW (ref 36.0–46.0)
Hemoglobin: 7.2 g/dL — ABNORMAL LOW (ref 12.0–15.0)
MCH: 27.5 pg (ref 26.0–34.0)
MCHC: 33 g/dL (ref 30.0–36.0)
MCV: 83.2 fL (ref 80.0–100.0)
Platelets: 324 10*3/uL (ref 150–400)
RBC: 2.62 MIL/uL — ABNORMAL LOW (ref 3.87–5.11)
RDW: 16.3 % — ABNORMAL HIGH (ref 11.5–15.5)
WBC: 6.3 10*3/uL (ref 4.0–10.5)
nRBC: 0 % (ref 0.0–0.2)

## 2022-08-25 LAB — COMPREHENSIVE METABOLIC PANEL
ALT: 22 U/L (ref 0–44)
AST: 26 U/L (ref 15–41)
Albumin: 2.1 g/dL — ABNORMAL LOW (ref 3.5–5.0)
Alkaline Phosphatase: 111 U/L (ref 38–126)
Anion gap: 6 (ref 5–15)
BUN: <5 mg/dL — ABNORMAL LOW (ref 6–20)
CO2: 27 mmol/L (ref 22–32)
Calcium: 8.2 mg/dL — ABNORMAL LOW (ref 8.9–10.3)
Chloride: 105 mmol/L (ref 98–111)
Creatinine, Ser: 0.74 mg/dL (ref 0.44–1.00)
GFR, Estimated: >60 mL/min (ref >60)
Glucose, Bld: 130 mg/dL — ABNORMAL HIGH (ref 70–99)
Potassium: 3.7 mmol/L (ref 3.5–5.1)
Sodium: 138 mmol/L (ref 135–145)
Total Bilirubin: <0.1 mg/dL — ABNORMAL LOW (ref 0.3–1.2)
Total Protein: 5.4 g/dL — ABNORMAL LOW (ref 6.5–8.1)

## 2022-08-25 LAB — GLUCOSE, CAPILLARY
Glucose-Capillary: 119 mg/dL — ABNORMAL HIGH (ref 70–99)
Glucose-Capillary: 81 mg/dL (ref 70–99)

## 2022-08-25 MED ORDER — METOPROLOL TARTRATE 25 MG PO TABS
25.0000 mg | ORAL_TABLET | Freq: Two times a day (BID) | ORAL | Status: DC
  Administered 2022-08-25 – 2022-08-29 (×10): 25 mg via ORAL
  Filled 2022-08-25 (×10): qty 1

## 2022-08-25 NOTE — Plan of Care (Signed)
  Problem: Health Behavior/Discharge Planning: Goal: Ability to manage health-related needs will improve Outcome: Progressing   

## 2022-08-25 NOTE — Progress Notes (Signed)
PROGRESS NOTE   Cindy Brooks  [id number] DOB: 1971/02/10 DOA: 08/14/2022 PCP: Hoy Register, MD  Brief Narrative:  37 yr blk female largely bedbound since April Antiphospholipid AB with hypercoag state Chr dysphagia htn Bed-bound form prior CVA 01/22/22 Multiple subsequent admits 03/2022 for AMS, 04/2022 for UTI  Came to Ed with altered mental status on 08/15/22 Tachy 140, lactic 2.3, WBC 7, Sod 149 Admitted with likely aspiration pneumonia and severe sepsis and placed on broad-spectrum antibiotics initially  11/3 palliative care consulted 11/4 EEG shows encephalopathy with encephalopathy 11/6 pulmonary consulted for left lower lobe partial postobstructive collapse and mediastinal shift they recommended placing patient right lung down to facilitate pulmonary drainage--felt like she was going to be at high risk for aspiration regardless 11/7 neurology consulted secondary to stroke found on MRI 11/10 core track placed feeds resumed as NG abdomen postoperative times  Hospital-Problem based course  Severe sepsis on admission secondary to aspiration pneumonia - CT on 11/2 showed mucoid debris in left-sided airways left overall lower and left upper lobe - Completed 7-day course of antibiotics 11/7 - SLP feels she is not safe to eat --await re-eval to see if can grad feeds - Periodic chest x-ray --last on 11/11 shows persisting LLL infiltrate  UTI from prior to admission likely secondary to indwelling Foley - Patient completed antibiotics  Hypoglycemia -Patient pulled out NG tube on 11/9 -core track placed 11/10-still not at goal feeds so continuing D5 but at 40 cc/H - Monitor trends  Acute metabolic encephalopathy, underlying prior CVA Possible encephalopathy from multiple meds - Gabapentin and Cymbalta other meds all held-mentation improved - Neurology consulted and recommending Brilinta for 4 weeks then aspirin alone no further work-up--Resume when has PEG placed - Metabolic  work-up ammonia TSH E72 all normal - Patient is more interactive verbalizing only a little  Hyponatremia acute metabolic acidosis hypokalemia hypomagnesemia - Metabolic derangements have resolved - Periodic labs  Anemia of chronic disease - Hemoglobin ranging from 7-8-monitor trends Lovenox to resume  Stage II right buttock pressure ulcer present from admission - Continue local wound care   DVT prophylaxis: Lovenox Code Status: Full Family Communication: Discussed with daughter [name] [phone] on phone-11/12, updated Disposition:  Status is: Inpatient Remains inpatient appropriate because:   Remains critically ill, but has improved slightly   Consultants:  Urology Pulmonology  Procedures: Multiple  Antimicrobials: Completed   Subjective:  More interactive--tries to verbalize No distress  Objective: Vitals:   08/25/22 0108 08/25/22 0400 08/25/22 0800 08/25/22 1200  BP:  (!) 118/91    Pulse: (!) 111 (!) 114    Resp: 15 (!) 25    Temp:  99.3 F (37.4 C) 98.2 F (36.8 C) 98.5 F (36.9 C)  TempSrc:  Axillary Axillary Axillary  SpO2: 100% 100%    Weight:  53.2 kg    Height:        Intake/Output Summary (Last 24 hours) at 08/25/2022 1313 Last data filed at 08/25/2022 1028 Gross per 24 hour  Intake 2060.36 ml  Output 2300 ml  Net -239.64 ml    Filed Weights   08/23/22 0419 08/24/22 0500 08/25/22 0400  Weight: 53.3 kg 53.3 kg 53.2 kg    Examination:  Awakens fair without distress No icterus no pallor, core track in place S1-S2 no murmur--tachy Chest seems clear no rales rhonchi to my exam Abdomen soft no rebound Cannot assess psych or neuro fully given level of interactivity   Data Reviewed: personally reviewed   CBC  Component Value Date/Time   WBC 6.3 08/25/2022 0309   RBC 2.62 (L) 08/25/2022 0309   HGB 7.2 (L) 08/25/2022 0309   HGB 11.1 (L) 06/14/2022 1040   HCT 21.8 (L) 08/25/2022 0309   PLT 324 08/25/2022 0309   PLT 244  06/14/2022 1040   MCV 83.2 08/25/2022 0309   MCH 27.5 08/25/2022 0309   MCHC 33.0 08/25/2022 0309   RDW 16.3 (H) 08/25/2022 0309   LYMPHSABS 1.4 08/21/2022 0543   MONOABS 0.8 08/21/2022 0543   EOSABS 0.1 08/21/2022 0543   BASOSABS 0.0 08/21/2022 0543      Latest Ref Rng & Units 08/25/2022    3:09 AM 08/24/2022    1:31 AM 08/23/2022    7:49 AM  CMP  Glucose 70 - 99 mg/dL 720  947  096   BUN 6 - 20 mg/dL <5  <5  <5   Creatinine 0.44 - 1.00 mg/dL 2.83  6.62  9.47   Sodium 135 - 145 mmol/L 138  137  137   Potassium 3.5 - 5.1 mmol/L 3.7  3.8  3.7   Chloride 98 - 111 mmol/L 105  102  105   CO2 22 - 32 mmol/L 27  24  23    Calcium 8.9 - 10.3 mg/dL 8.2  8.7  8.5   Total Protein 6.5 - 8.1 g/dL 5.4  5.4  5.0   Total Bilirubin 0.3 - 1.2 mg/dL  <6.5  0.2   Alkaline Phos 38 - 126 U/L 111  88  92   AST 15 - 41 U/L 26  23  22    ALT 0 - 44 U/L 22  17  14       Radiology Studies: DG CHEST PORT 1 VIEW  Result Date: 08/25/2022 CLINICAL DATA:  Pneumonia.  Follow-up study. EXAM: PORTABLE CHEST 1 VIEW COMPARISON:  08/20/2022 and older exams.  CT, 08/15/2022. FINDINGS: Cardiac silhouette mildly enlarged. Opacity at the left lung base obscures hemidiaphragm. Subtle hazy opacity at the right lung base accentuated by low lung volumes. Remainder of the lungs is clear. No pneumothorax. Enteric tube passes well below the diaphragm, tip in the right upper quadrant, replacing a nasogastric tube present on the previous study. IMPRESSION: 1. Persistent left lung base opacity consistent with atelectasis and/or pneumonia. Suspect a small associated effusion. 2. Mild hazy opacity at the right lung base consistent with atelectasis. Possible small right effusion. 3. No pulmonary edema.  No pneumothorax. 4. New enteric feeding tube tip projects in the right upper quadrant, likely in the first or second portion of the duodenum. Electronically Signed   By: 13/09/2022 M.D.   On: 08/25/2022 10:54   DG Abd Portable  1V  Result Date: 08/23/2022 CLINICAL DATA:  Feeding tube placement EXAM: PORTABLE ABDOMEN - 1 VIEW COMPARISON:  08/21/2022 FINDINGS: Non weighted enteric feeding tube is positioned with tip and side port below the diaphragm, tip at the right aspect of the lumbar column in the vicinity of the duodenal bulb. Nonobstructive pattern of included bowel gas. IMPRESSION: Non weighted enteric feeding tube is positioned with tip and side port below the diaphragm, tip at the right aspect of the lumbar column in the vicinity of the duodenal bulb. Electronically Signed   By: 13/09/2022 M.D.   On: 08/23/2022 17:12     Scheduled Meds:  amantadine  100 mg Per Tube BID   atorvastatin  80 mg Per Tube Daily   capsaicin   Topical BID   Chlorhexidine Gluconate  Cloth  6 each Topical Daily   folic acid  1 mg Per Tube Daily   free water  100 mL Per Tube Q6H   guaiFENesin  10 mL Per Tube BID   metoprolol tartrate  25 mg Oral BID   multivitamin  15 mL Per Tube Daily   scopolamine  1 patch Transdermal Q72H   thiamine (VITAMIN B1) injection  100 mg Intravenous Daily   Continuous Infusions:  dextrose Stopped (08/24/22 1900)   dextrose 75 mL/hr at 08/25/22 0412   feeding supplement (OSMOLITE 1.2 CAL) 1,000 mL (08/25/22 0053)     LOS: 10 days   Time spent: 60  Rhetta Mura, MD [facility] To contact the attending provider between 7A-7P or the covering provider during after hours 7P-7A, please log into the web site [email or web address] and access using universal [place] password for that web site. If you do not have the password, please call the hospital operator.  08/25/2022, 1:13 PM

## 2022-08-26 DIAGNOSIS — J159 Unspecified bacterial pneumonia: Secondary | ICD-10-CM | POA: Diagnosis not present

## 2022-08-26 LAB — GLUCOSE, CAPILLARY
Glucose-Capillary: 101 mg/dL — ABNORMAL HIGH (ref 70–99)
Glucose-Capillary: 107 mg/dL — ABNORMAL HIGH (ref 70–99)
Glucose-Capillary: 108 mg/dL — ABNORMAL HIGH (ref 70–99)
Glucose-Capillary: 109 mg/dL — ABNORMAL HIGH (ref 70–99)
Glucose-Capillary: 96 mg/dL (ref 70–99)

## 2022-08-26 NOTE — Progress Notes (Signed)
PROGRESS NOTE   Cindy Brooks  OVF:[id number] DOB: 11/04/1970 DOA: 08/14/2022 PCP: Hoy Register, MD  Brief Narrative:  45 yr blk female largely bedbound since April Antiphospholipid AB with hypercoag state Chr dysphagia htn Bed-bound form prior CVA 01/22/22 Multiple subsequent admits 03/2022 for AMS, 04/2022 for UTI  Came to Ed with altered mental status on 08/15/22 Tachy 140, lactic 2.3, WBC 7, Sod 149 Admitted with likely aspiration pneumonia and severe sepsis and placed on broad-spectrum antibiotics initially  11/3 palliative care consulted 11/4 EEG shows encephalopathy with encephalopathy 11/6 pulmonary consulted for left lower lobe partial postobstructive collapse and mediastinal shift they recommended placing patient right lung down to facilitate pulmonary drainage--felt like she was going to be at high risk for aspiration regardless 11/7 neurology consulted secondary to stroke found on MRI 11/10 core track placed feeds resumed as NG abdomen postoperative times  Hospital-Problem based course  Severe sepsis on admission secondary to aspiration pneumonia - CT on 11/2 showed mucoid debris in left-sided airways left overall lower and left upper lobe - Completed 7-day course of antibiotics 11/7 - SLP feels she is not safe to eat --remains unsafe on reevaluation 11/30 by SLP - See below - Periodic chest x-ray --last on 11/11 shows persisting LLL infiltrate  UTI from prior to admission likely secondary to indwelling Foley - Patient completed antibiotics  Chronic dysphagia - Not safe to eat core track in place, PEG tube to be placed 11/16 and then can discontinue core track  Hypoglycemia -Patient pulled out NG tube on 11/9 -core track placed 11/10-now at goal so discontinued IVF with D5 - Monitor trends  Acute metabolic encephalopathy, underlying prior CVA Possible encephalopathy from multiple meds - Gabapentin and Cymbalta other meds all held-mentation improved over the next  several days - Neurology consulted and recommending Brilinta for 4 weeks then aspirin alone no further work-up--both have been held for placement of PEG. - Metabolic work-up ammonia TSH B12 all normal - Patient is more interactive verbalizing only a little  Hyponatremia acute metabolic acidosis hypokalemia hypomagnesemia - Metabolic derangements have resolved - Periodic labs  Anemia of chronic disease - Hemoglobin ranging from 7-8-monitor trends Lovenox to resume  Stage II right buttock pressure ulcer present from admission - Continue local wound care   DVT prophylaxis: Lovenox Code Status: Full Family Communication: Discussed with daughter [name] [phone] on phone-11/12, updated Disposition:  Status is: Inpatient Remains inpatient appropriate because:   Remains critically ill, but has improved slightly   Consultants:  Urology Pulmonology  Procedures: Multiple  Antimicrobials: Completed   Subjective:  Sleepy today but arouses Not as verbal Does not seem to be in distress  Objective: Vitals:   08/26/22 0400 08/26/22 0500 08/26/22 0847 08/26/22 1200  BP: 125/81     Pulse: (!) 104     Resp: 15     Temp: 99.2 F (37.3 C)  99.3 F (37.4 C) 99.1 F (37.3 C)  TempSrc: Axillary  Axillary Axillary  SpO2: 100%     Weight:  53.5 kg    Height:        Intake/Output Summary (Last 24 hours) at 08/26/2022 1240 Last data filed at 08/26/2022 0649 Gross per 24 hour  Intake 200 ml  Output 600 ml  Net -400 ml    Filed Weights   08/24/22 0500 08/25/22 0400 08/26/22 0500  Weight: 53.3 kg 53.2 kg 53.5 kg    Examination:  Rousable No icterus no pallor, core track in place S1-S2 no murmur--tachy on monitors  Chest seems clear no rales rhonchi to my exam Abdomen soft no rebound Sleepiness precludes full neuro evaluation  Data Reviewed: personally reviewed   CBC    Component Value Date/Time   WBC 6.3 08/25/2022 0309   RBC 2.62 (L) 08/25/2022 0309    HGB 7.2 (L) 08/25/2022 0309   HGB 11.1 (L) 06/14/2022 1040   HCT 21.8 (L) 08/25/2022 0309   PLT 324 08/25/2022 0309   PLT 244 06/14/2022 1040   MCV 83.2 08/25/2022 0309   MCH 27.5 08/25/2022 0309   MCHC 33.0 08/25/2022 0309   RDW 16.3 (H) 08/25/2022 0309   LYMPHSABS 1.4 08/21/2022 0543   MONOABS 0.8 08/21/2022 0543   EOSABS 0.1 08/21/2022 0543   BASOSABS 0.0 08/21/2022 0543      Latest Ref Rng & Units 08/25/2022    3:09 AM 08/24/2022    1:31 AM 08/23/2022    7:49 AM  CMP  Glucose 70 - 99 mg/dL 132  440  102   BUN 6 - 20 mg/dL <5  <5  <5   Creatinine 0.44 - 1.00 mg/dL 7.25  3.66  4.40   Sodium 135 - 145 mmol/L 138  137  137   Potassium 3.5 - 5.1 mmol/L 3.7  3.8  3.7   Chloride 98 - 111 mmol/L 105  102  105   CO2 22 - 32 mmol/L 27  24  23    Calcium 8.9 - 10.3 mg/dL 8.2  8.7  8.5   Total Protein 6.5 - 8.1 g/dL 5.4  5.4  5.0   Total Bilirubin 0.3 - 1.2 mg/dL  <3.4  0.2   Alkaline Phos 38 - 126 U/L 111  88  92   AST 15 - 41 U/L 26  23  22    ALT 0 - 44 U/L 22  17  14       Radiology Studies: DG CHEST PORT 1 VIEW  Result Date: 08/25/2022 CLINICAL DATA:  Pneumonia.  Follow-up study. EXAM: PORTABLE CHEST 1 VIEW COMPARISON:  08/20/2022 and older exams.  CT, 08/15/2022. FINDINGS: Cardiac silhouette mildly enlarged. Opacity at the left lung base obscures hemidiaphragm. Subtle hazy opacity at the right lung base accentuated by low lung volumes. Remainder of the lungs is clear. No pneumothorax. Enteric tube passes well below the diaphragm, tip in the right upper quadrant, replacing a nasogastric tube present on the previous study. IMPRESSION: 1. Persistent left lung base opacity consistent with atelectasis and/or pneumonia. Suspect a small associated effusion. 2. Mild hazy opacity at the right lung base consistent with atelectasis. Possible small right effusion. 3. No pulmonary edema.  No pneumothorax. 4. New enteric feeding tube tip projects in the right upper quadrant, likely in the  first or second portion of the duodenum. Electronically Signed   By: 13/09/2022 M.D.   On: 08/25/2022 10:54     Scheduled Meds:  amantadine  100 mg Per Tube BID   atorvastatin  80 mg Per Tube Daily   capsaicin   Topical BID   Chlorhexidine Gluconate Cloth  6 each Topical Daily   folic acid  1 mg Per Tube Daily   free water  100 mL Per Tube Q6H   guaiFENesin  10 mL Per Tube BID   metoprolol tartrate  25 mg Oral BID   multivitamin  15 mL Per Tube Daily   scopolamine  1 patch Transdermal Q72H   thiamine (VITAMIN B1) injection  100 mg Intravenous Daily   Continuous Infusions:  feeding supplement (OSMOLITE  1.2 CAL) 1,000 mL (08/25/22 0053)     LOS: 11 days   Time spent: 25  Rhetta Mura, MD [facility] To contact the attending provider between 7A-7P or the covering provider during after hours 7P-7A, please log into the web site [email or web address] and access using universal [place] password for that web site. If you do not have the password, please call the hospital operator.  08/26/2022, 12:40 PM

## 2022-08-26 NOTE — Progress Notes (Addendum)
Speech Language Pathology Treatment: Dysphagia  Patient Details Name: Cindy Brooks MRN: [id number] DOB: [birth year] Today's Date: 08/26/2022 Time: 5102-5852 SLP Time Calculation (min) (ACUTE ONLY): 13 min  Assessment / Plan / Recommendation Clinical Impression  Pt was a little more alert today in that she responded to SLP voicing by opening her eyes, although still inconsistently. She opened her mouth in response to the toothbrush but also bit down on it so that oral care was a little limited. Although she would open her mouth for that, she showed signs of clear refusal when SLP tried to present other boluses via spoon or straw. Regardless of texture offered, she pursed her lips and turned her head away. SLP will continue to follow for potential to begin PO diet if swallowing function can be better assessed; however, suspect that mentation may make adequate intake challenging and so note that she is planning for PEG.    HPI HPI: Cindy Brooks is a 51 y.o. female with medical history significant of recurrent UTI who presents emergency department due to cough and altered mental status.  Patient is nonverbal at baseline and bedbound due to prior stroke.  She had a two-month hospitalization in April of 2023 due to bilateral thalamic, midbrain, and bilateral cerebral infarctions. She had trach/PEG at that time and dysphagia and cognitive/language therapies. Chest CT reported "Mucoid debris in the left-sided airways with near-complete postobstructive collapse of the left lower lobe and partial postobstructive collapse of the left upper lobe, with significant associated volume loss and leftward mediastinal shift."  Most recent BSE on 03/25/22 with recommendations for Dysphagia 2 solids and thin liquids.      SLP Plan  Continue with current plan of care      Recommendations for follow up therapy are one component of a multi-disciplinary discharge planning process, led by the attending physician.   Recommendations may be updated based on patient status, additional functional criteria and insurance authorization.    Recommendations  Diet recommendations: NPO Medication Administration: Via alternative means                Oral Care Recommendations: Oral care QID Follow Up Recommendations: Home health SLP Assistance recommended at discharge: Frequent or constant Supervision/Assistance SLP Visit Diagnosis: Dysphagia, unspecified (R13.10) Plan: Continue with current plan of care           Mahala Menghini., M.A. CCC-SLP Acute Rehabilitation Services Office [phone]  Secure chat preferred   08/26/2022, 11:43 AM

## 2022-08-27 DIAGNOSIS — J159 Unspecified bacterial pneumonia: Secondary | ICD-10-CM | POA: Diagnosis not present

## 2022-08-27 LAB — COMPREHENSIVE METABOLIC PANEL
ALT: 22 U/L (ref 0–44)
AST: 26 U/L (ref 15–41)
Albumin: 2 g/dL — ABNORMAL LOW (ref 3.5–5.0)
Alkaline Phosphatase: 98 U/L (ref 38–126)
Anion gap: 9 (ref 5–15)
BUN: 7 mg/dL (ref 6–20)
CO2: 30 mmol/L (ref 22–32)
Calcium: 8.5 mg/dL — ABNORMAL LOW (ref 8.9–10.3)
Chloride: 101 mmol/L (ref 98–111)
Creatinine, Ser: 0.68 mg/dL (ref 0.44–1.00)
GFR, Estimated: >60 mL/min (ref >60)
Glucose, Bld: 91 mg/dL (ref 70–99)
Potassium: 4.6 mmol/L (ref 3.5–5.1)
Sodium: 140 mmol/L (ref 135–145)
Total Bilirubin: 0.4 mg/dL (ref 0.3–1.2)
Total Protein: 5.1 g/dL — ABNORMAL LOW (ref 6.5–8.1)

## 2022-08-27 LAB — CBC WITH DIFFERENTIAL/PLATELET
Abs Immature Granulocytes: 0.02 10*3/uL (ref 0.00–0.07)
Basophils Absolute: 0 10*3/uL (ref 0.0–0.1)
Basophils Relative: 1 %
Eosinophils Absolute: 0.1 10*3/uL (ref 0.0–0.5)
Eosinophils Relative: 2 %
HCT: 18.2 % — ABNORMAL LOW (ref 36.0–46.0)
Hemoglobin: 6.3 g/dL — CL (ref 12.0–15.0)
Immature Granulocytes: 0 %
Lymphocytes Relative: 32 %
Lymphs Abs: 2.1 10*3/uL (ref 0.7–4.0)
MCH: 28.6 pg (ref 26.0–34.0)
MCHC: 34.6 g/dL (ref 30.0–36.0)
MCV: 82.7 fL (ref 80.0–100.0)
Monocytes Absolute: 0.7 10*3/uL (ref 0.1–1.0)
Monocytes Relative: 11 %
Neutro Abs: 3.6 10*3/uL (ref 1.7–7.7)
Neutrophils Relative %: 54 %
Platelets: 318 10*3/uL (ref 150–400)
RBC: 2.2 MIL/uL — ABNORMAL LOW (ref 3.87–5.11)
RDW: 16.5 % — ABNORMAL HIGH (ref 11.5–15.5)
WBC: 6.5 10*3/uL (ref 4.0–10.5)
nRBC: 0 % (ref 0.0–0.2)

## 2022-08-27 LAB — ABO/RH: ABO/RH(D): A POS

## 2022-08-27 LAB — GLUCOSE, CAPILLARY
Glucose-Capillary: 103 mg/dL — ABNORMAL HIGH (ref 70–99)
Glucose-Capillary: 108 mg/dL — ABNORMAL HIGH (ref 70–99)
Glucose-Capillary: 110 mg/dL — ABNORMAL HIGH (ref 70–99)
Glucose-Capillary: 90 mg/dL (ref 70–99)
Glucose-Capillary: 92 mg/dL (ref 70–99)
Glucose-Capillary: 95 mg/dL (ref 70–99)

## 2022-08-27 LAB — PREPARE RBC (CROSSMATCH)

## 2022-08-27 LAB — HEMOGLOBIN AND HEMATOCRIT, BLOOD
HCT: 27 % — ABNORMAL LOW (ref 36.0–46.0)
Hemoglobin: 9.5 g/dL — ABNORMAL LOW (ref 12.0–15.0)

## 2022-08-27 MED ORDER — DIPHENHYDRAMINE HCL 25 MG PO CAPS
25.0000 mg | ORAL_CAPSULE | Freq: Once | ORAL | Status: AC
  Administered 2022-08-27: 25 mg via ORAL
  Filled 2022-08-27: qty 1

## 2022-08-27 MED ORDER — SODIUM CHLORIDE 0.9% IV SOLUTION: Freq: Once | INTRAVENOUS | Status: AC

## 2022-08-27 MED ORDER — FUROSEMIDE 10 MG/ML IJ SOLN
20.0000 mg | Freq: Once | INTRAMUSCULAR | Status: AC
  Administered 2022-08-27: 20 mg via INTRAVENOUS
  Filled 2022-08-27: qty 2

## 2022-08-27 NOTE — Progress Notes (Signed)
PROGRESS NOTE   Cindy Brooks  [id number] DOB: 1971/04/23 DOA: 08/14/2022 PCP: Charlott Rakes, MD  Brief Narrative:  65 yr blk female largely bedbound since April Antiphospholipid AB with hypercoag state Chr dysphagia htn Bed-bound form prior CVA 01/22/22 Multiple subsequent admits 03/2022 for AMS, 04/2022 for UTI  Came to Ed with altered mental status on 08/15/22 Tachy 140, lactic 2.3, WBC 7, Sod 149 Admitted with likely aspiration pneumonia and severe sepsis and placed on broad-spectrum antibiotics initially  11/3 palliative care consulted 11/4 EEG shows encephalopathy with encephalopathy 11/6 pulmonary consulted for left lower lobe partial postobstructive collapse and mediastinal shift they recommended placing patient right lung down to facilitate pulmonary drainage--felt like she was going to be at high risk for aspiration regardless 11/7 neurology consulted secondary to stroke found on MRI 11/10 core track placed feeds resumed as NG abdomen postoperative times  Hospital-Problem based course  Severe sepsis on admission secondary to aspiration pneumonia - CT on 11/2 showed mucoid debris in left-sided airways left overall lower and left upper lobe - Completed 7-day course of antibiotics 11/7 - SLP feels she is not safe to eat --remains unsafe on reevaluation 11/30 by SLP - See below - Periodic chest x-ray --last on 11/11 shows persisting LLL infiltrate associated with low-grade fevers, family wants everything done  UTI from prior to admission likely secondary to indwelling Foley - Patient completed antibiotics  Chronic dysphagia - Not safe to eat core track in place, PEG tube to be placed 11/16 and then can discontinue core track  Hypoglycemia -Patient pulled out NG tube on 11/9 -core track placed 11/10-now at goal so discontinued IVF with D5 - CBGs are trending 90s to [id number]  Acute metabolic encephalopathy, underlying prior CVA Possible encephalopathy from multiple  meds - Gabapentin and Cymbalta other meds all held-mentation improved over the next several days - Neurology consulted and recommending Brilinta for 4 weeks then aspirin alone no further work-up--both have been held for placement of PEG. - Metabolic work-up ammonia TSH B12 all normal - Patient is more interactive verbalizing only a little  Hyponatremia acute metabolic acidosis hypokalemia hypomagnesemia - Metabolic derangements have resolved - Periodic labs  Anemia of chronic disease with iatrogenic blood loss - Hemoglobin previously stable -Suspect low hemoglobin likely nutritional component in addition to lab variation-we will Hemoccult stool - Transfuse 1 unit PRBC  Stage II right buttock pressure ulcer present from admission - Continue local wound care   DVT prophylaxis: Lovenox Code Status: Full Family Communication: Discussed with daughter Armando Reichert [phone] on phone-11/14, updated-informed her about low hemoglobin but this nightly nutritional and that although patient may have a low hemoglobin it is unlikely from bleeding Also informed moderate to high risk of putting under anesthesia to scope and difficulty with sedation in someone who already has mild encephalopathy-she understands Disposition:  Status is: Inpatient Remains inpatient appropriate because:   Remains critically ill, but has improved slightly with mentation   Consultants:  Urology Pulmonology  Procedures: Multiple  Antimicrobials: Completed   Subjective:  Some sleepy but otherwise seems about the same  Nursing informs that hemoglobin is 6.3 so we will give a unit of blood She remains essentially unchanged since yesterday is not very interactive  Objective: Vitals:   08/26/22 1200 08/26/22 2000 08/27/22 0400 08/27/22 0757  BP: 112/82 122/84 115/82   Pulse: 92 97    Resp: 20 (!) 21 20   Temp: 99.1 F (37.3 C) 98.9 F (37.2 C) 99 F (37.2 C) 98.6 F (  37 C)  TempSrc: Axillary Oral Oral  Axillary  SpO2:  99% 100%   Weight:      Height:       No intake or output data in the 24 hours ending 08/27/22 1007  Filed Weights   08/24/22 0500 08/25/22 0400 08/26/22 0500  Weight: 53.3 kg 53.2 kg 53.5 kg    Examination:  Rousable has core track in place EOMI NCAT no focal deficit no rales or rhonchi No icterus no pallor Chest is clear no added sound no wheeze Abdomen is soft no rebound no guarding No lower extremity edema  Data Reviewed: personally reviewed   CBC    Component Value Date/Time   WBC 6.5 08/27/2022 0845   RBC 2.20 (L) 08/27/2022 0845   HGB 6.3 (LL) 08/27/2022 0845   HGB 11.1 (L) 06/14/2022 1040   HCT 18.2 (L) 08/27/2022 0845   PLT 318 08/27/2022 0845   PLT 244 06/14/2022 1040   MCV 82.7 08/27/2022 0845   MCH 28.6 08/27/2022 0845   MCHC 34.6 08/27/2022 0845   RDW 16.5 (H) 08/27/2022 0845   LYMPHSABS 2.1 08/27/2022 0845   MONOABS 0.7 08/27/2022 0845   EOSABS 0.1 08/27/2022 0845   BASOSABS 0.0 08/27/2022 0845      Latest Ref Rng & Units 08/27/2022    8:45 AM 08/25/2022    3:09 AM 08/24/2022    1:31 AM  CMP  Glucose 70 - 99 mg/dL 91  119  147   BUN 6 - 20 mg/dL 7  <5  <5   Creatinine 0.44 - 1.00 mg/dL 8.29  5.62  1.30   Sodium 135 - 145 mmol/L 140  138  137   Potassium 3.5 - 5.1 mmol/L 4.6  3.7  3.8   Chloride 98 - 111 mmol/L 101  105  102   CO2 22 - 32 mmol/L 30  27  24    Calcium 8.9 - 10.3 mg/dL 8.5  8.2  8.7   Total Protein 6.5 - 8.1 g/dL 5.1  5.4  5.4   Total Bilirubin 0.3 - 1.2 mg/dL 0.4   <8.6   Alkaline Phos 38 - 126 U/L 98  111  88   AST 15 - 41 U/L 26  26  23    ALT 0 - 44 U/L 22  22  17       Radiology Studies: No results found.   Scheduled Meds:  amantadine  100 mg Per Tube BID   atorvastatin  80 mg Per Tube Daily   capsaicin   Topical BID   Chlorhexidine Gluconate Cloth  6 each Topical Daily   folic acid  1 mg Per Tube Daily   free water  100 mL Per Tube Q6H   guaiFENesin  10 mL Per Tube BID   metoprolol  tartrate  25 mg Oral BID   multivitamin  15 mL Per Tube Daily   scopolamine  1 patch Transdermal Q72H   thiamine (VITAMIN B1) injection  100 mg Intravenous Daily   Continuous Infusions:  feeding supplement (OSMOLITE 1.2 CAL) 1,000 mL (08/25/22 0053)     LOS: 12 days   Time spent: 30  , MD [facility] To contact the attending provider between 7A-7P or the covering provider during after hours 7P-7A, please log into the web site [email or web address] and access using universal [place] password for that web site. If you do not have the password, please call the hospital operator.  08/27/2022, 10:07 AM

## 2022-08-28 DIAGNOSIS — G9341 Metabolic encephalopathy: Secondary | ICD-10-CM | POA: Diagnosis not present

## 2022-08-28 DIAGNOSIS — J159 Unspecified bacterial pneumonia: Secondary | ICD-10-CM | POA: Diagnosis not present

## 2022-08-28 DIAGNOSIS — R1310 Dysphagia, unspecified: Secondary | ICD-10-CM | POA: Diagnosis not present

## 2022-08-28 LAB — GLUCOSE, CAPILLARY
Glucose-Capillary: 120 mg/dL — ABNORMAL HIGH (ref 70–99)
Glucose-Capillary: 109 mg/dL — ABNORMAL HIGH (ref 70–99)
Glucose-Capillary: 119 mg/dL — ABNORMAL HIGH (ref 70–99)
Glucose-Capillary: 120 mg/dL — ABNORMAL HIGH (ref 70–99)
Glucose-Capillary: 106 mg/dL — ABNORMAL HIGH (ref 70–99)

## 2022-08-28 LAB — CBC WITH DIFFERENTIAL/PLATELET
Abs Immature Granulocytes: 0.03 10*3/uL (ref 0.00–0.07)
Basophils Absolute: 0 10*3/uL (ref 0.0–0.1)
Basophils Relative: 1 %
Eosinophils Absolute: 0.1 10*3/uL (ref 0.0–0.5)
Eosinophils Relative: 2 %
HCT: 26.3 % — ABNORMAL LOW (ref 36.0–46.0)
Hemoglobin: 9.2 g/dL — ABNORMAL LOW (ref 12.0–15.0)
Immature Granulocytes: 0 %
Lymphocytes Relative: 30 %
Lymphs Abs: 2.2 10*3/uL (ref 0.7–4.0)
MCH: 28.6 pg (ref 26.0–34.0)
MCHC: 35 g/dL (ref 30.0–36.0)
MCV: 81.7 fL (ref 80.0–100.0)
Monocytes Absolute: 0.8 10*3/uL (ref 0.1–1.0)
Monocytes Relative: 11 %
Neutro Abs: 4.1 10*3/uL (ref 1.7–7.7)
Neutrophils Relative %: 56 %
Platelets: 360 10*3/uL (ref 150–400)
RBC: 3.22 MIL/uL — ABNORMAL LOW (ref 3.87–5.11)
RDW: 15.9 % — ABNORMAL HIGH (ref 11.5–15.5)
WBC: 7.3 10*3/uL (ref 4.0–10.5)
nRBC: 0 % (ref 0.0–0.2)

## 2022-08-28 LAB — TYPE AND SCREEN
ABO/RH(D): A POS
Antibody Screen: NEGATIVE
Unit division: 0

## 2022-08-28 LAB — BPAM RBC
Blood Product Expiration Date: 202311212359
ISSUE DATE / TIME: 202311141234
Unit Type and Rh: 6200

## 2022-08-28 LAB — BASIC METABOLIC PANEL
Anion gap: 10 (ref 5–15)
BUN: 9 mg/dL (ref 6–20)
CO2: 31 mmol/L (ref 22–32)
Calcium: 8.7 mg/dL — ABNORMAL LOW (ref 8.9–10.3)
Chloride: 98 mmol/L (ref 98–111)
Creatinine, Ser: 0.71 mg/dL (ref 0.44–1.00)
GFR, Estimated: >60 mL/min (ref >60)
Glucose, Bld: 118 mg/dL — ABNORMAL HIGH (ref 70–99)
Potassium: 4.9 mmol/L (ref 3.5–5.1)
Sodium: 139 mmol/L (ref 135–145)

## 2022-08-28 NOTE — Progress Notes (Addendum)
Speech Language Pathology Treatment: Dysphagia  Patient Details Name: [name] MRN: [id number] DOB: 10-21-1970 Today's Date: 08/28/2022 Time: 4259-5638 SLP Time Calculation (min) (ACUTE ONLY): 35 min  Assessment / Plan / Recommendation Clinical Impression  Pt seen at bedside for continued PO trials. Pt was awake and alert, daughter and family at bedside. Pt's daughter indicated they had a FaceTime chat last night, and pt communicated verbally with multiple family members. Oral care was completed with suction, which pt tolerated well. Following oral care, pt accepted trials of ice chips, thin liquid, nectar thick liquid, and puree textures. No cough response noted on any texture except consecutive boluses of thin liquid, which elicited a strong reflexive cough response. Pt required cueing to verbalize rather than shake her head yes/no or use gestures. Vocal intensity was vet low, but quality was clear. If pt's level of alertness continues, she is ready to complete instrumental study.   Pt is to have PEG placement tomorrow (09/08/22). Communicated with MD and RN regarding timing of proceeding with MBS to identify least restrictive diet. SLP will complete MBS after PEG placement, likely Friday.   HPI HPI: Cindy Brooks is a 51 y.o. female with medical history significant of recurrent UTI who presents emergency department due to cough and altered mental status.  Patient is nonverbal at baseline and bedbound due to prior stroke.  She had a two-month hospitalization in April of 2023 due to bilateral thalamic, midbrain, and bilateral cerebral infarctions. She had trach/PEG at that time and dysphagia and cognitive/language therapies. Chest CT reported "Mucoid debris in the left-sided airways with near-complete postobstructive collapse of the left lower lobe and partial postobstructive collapse of the left upper lobe, with significant associated volume loss and leftward mediastinal shift."  Most recent BSE  on 03/25/22 with recommendations for Dysphagia 2 solids and thin liquids.      SLP Plan  MBS      Recommendations for follow up therapy are one component of a multi-disciplinary discharge planning process, led by the attending physician.  Recommendations may be updated based on patient status, additional functional criteria and insurance authorization.    Recommendations  Diet recommendations: NPO Medication Administration: Via alternative means                Oral Care Recommendations: Oral care QID Follow Up Recommendations: Other (comment) (TBD) SLP Visit Diagnosis: Dysphagia, unspecified (R13.10) Plan: MBS          Sharnee Douglass B. Murvin Natal, [facility], CCC-SLP Speech Language Pathologist Office: [phone]  Leigh Aurora 08/28/2022, 12:23 PM

## 2022-08-28 NOTE — Progress Notes (Signed)
PROGRESS NOTE   Cindy Brooks  MWN:[id number] DOB: 1971/02/27 DOA: 08/14/2022 PCP: Hoy Register, MD   Brief Narrative:   5 yr blk female largely bedbound since April Antiphospholipid AB with hypercoag state Chr dysphagia htn Bed-bound form prior CVA 01/22/22 Multiple subsequent admits 03/2022 for AMS, 04/2022 for UTI  Came to Ed with altered mental status on 08/15/22 Tachy 140, lactic 2.3, WBC 7, Sod 149 Admitted with likely aspiration pneumonia and severe sepsis and placed on broad-spectrum antibiotics initially  11/3 palliative care consulted 11/4 EEG shows encephalopathy with encephalopathy 11/6 pulmonary consulted for left lower lobe partial postobstructive collapse and mediastinal shift they recommended placing patient right lung down to facilitate pulmonary drainage--felt like she was going to be at high risk for aspiration regardless 11/7 neurology consulted secondary to stroke found on MRI 11/10 core track placed feeds resumed as NG abdomen postoperative times  Hospital-Problem based course  Severe sepsis on admission secondary to aspiration pneumonia - CT on 11/2 showed mucoid debris in left-sided airways left overall lower and left upper lobe - Completed 7-day course of antibiotics 11/7 - SLP feels she is not safe to eat --remains unsafe on reevaluation 11/30 by SLP - See below - Periodic chest x-ray --last on 11/11 shows persisting LLL infiltrate associated with low-grade fevers, family wants everything done  UTI from prior to admission likely secondary to indwelling Foley - Patient completed antibiotics  Chronic dysphagia - Not safe to eat core track in place, PEG tube to be placed 11/16 and then can discontinue core track  Hypoglycemia -Patient pulled out NG tube on 11/9 -core track placed 11/10-now at goal so discontinued IVF with D5 - CBGs are trending 90s to 100s  Acute metabolic encephalopathy, underlying prior CVA Possible encephalopathy from multiple  meds - Gabapentin and Cymbalta other meds all held-mentation improved over the next several days - Neurology consulted and recommending Brilinta for 4 weeks then aspirin alone no further work-up--both have been held for placement of PEG. - Metabolic work-up ammonia TSH B12 all normal - Patient is more interactive verbalizing only a little  Hyponatremia acute metabolic acidosis hypokalemia hypomagnesemia - Metabolic derangements have resolved - Periodic labs  Anemia of chronic disease with iatrogenic blood loss - Elevated ferritin level. - 1 Unit PRBC 11/4, with good response, hemoglobin at 9.2. -Gross GI bleed  Stage II right buttock pressure ulcer present from admission - Continue local wound care   DVT prophylaxis: Lovenox to be started tomorrow after PEG Code Status: Full Family Communication: None at bedside Disposition:  Status is: Inpatient Remains inpatient appropriate because:   Remains critically ill, but has improved slightly with mentation   Consultants:  Neurology Pulmonology  Procedures: Multiple  Antimicrobials: Completed   Subjective:  No significant events overnight as discussed with staff, she received 1 unit yesterday, with good response, hemoglobin is 9.2 this morning   Objective: Vitals:   08/28/22 0355 08/28/22 0510 08/28/22 0945 08/28/22 1202  BP: 123/86  (!) 132/97 (!) 130/97  Pulse: 87  99 98  Resp: 19  (!) 24 19  Temp: 98 F (36.7 C)  99 F (37.2 C) 98 F (36.7 C)  TempSrc: Axillary  Oral Oral  SpO2: 100%  100% 100%  Weight:  51.7 kg    Height:        Intake/Output Summary (Last 24 hours) at 08/28/2022 1449 Last data filed at 08/28/2022 0512 Gross per 24 hour  Intake 1154.58 ml  Output 700 ml  Net 454.58 ml  Filed Weights   08/25/22 0400 08/26/22 0500 08/28/22 0510  Weight: 53.2 kg 53.5 kg 51.7 kg    Examination:  Awake , pleasant, core track in place Symmetrical Chest wall movement, Good air movement bilaterally,  CTAB RRR,No Gallops,Rubs or new Murmurs, No Parasternal Heave +ve B.Sounds, Abd Soft, No tenderness, No rebound - guarding or rigidity. No Cyanosis, Clubbing or edema, No new Rash or bruise  Data Reviewed: personally reviewed   CBC    Component Value Date/Time   WBC 7.3 08/28/2022 0817   RBC 3.22 (L) 08/28/2022 0817   HGB 9.2 (L) 08/28/2022 0817   HGB 11.1 (L) 06/14/2022 1040   HCT 26.3 (L) 08/28/2022 0817   PLT 360 08/28/2022 0817   PLT 244 06/14/2022 1040   MCV 81.7 08/28/2022 0817   MCH 28.6 08/28/2022 0817   MCHC 35.0 08/28/2022 0817   RDW 15.9 (H) 08/28/2022 0817   LYMPHSABS 2.2 08/28/2022 0817   MONOABS 0.8 08/28/2022 0817   EOSABS 0.1 08/28/2022 0817   BASOSABS 0.0 08/28/2022 0817      Latest Ref Rng & Units 08/28/2022    8:17 AM 08/27/2022    8:45 AM 08/25/2022    3:09 AM  CMP  Glucose 70 - 99 mg/dL 736  91  681   BUN 6 - 20 mg/dL 9  7  <5   Creatinine 5.94 - 1.00 mg/dL 7.07  6.15  1.83   Sodium 135 - 145 mmol/L 139  140  138   Potassium 3.5 - 5.1 mmol/L 4.9  4.6  3.7   Chloride 98 - 111 mmol/L 98  101  105   CO2 22 - 32 mmol/L 31  30  27    Calcium 8.9 - 10.3 mg/dL 8.7  8.5  8.2   Total Protein 6.5 - 8.1 g/dL  5.1  5.4   Total Bilirubin 0.3 - 1.2 mg/dL  0.4    Alkaline Phos 38 - 126 U/L  98  111   AST 15 - 41 U/L  26  26   ALT 0 - 44 U/L  22  22      Radiology Studies: No results found.   Scheduled Meds:  amantadine  100 mg Per Tube BID   atorvastatin  80 mg Per Tube Daily   capsaicin   Topical BID   Chlorhexidine Gluconate Cloth  6 each Topical Daily   folic acid  1 mg Per Tube Daily   free water  100 mL Per Tube Q6H   guaiFENesin  10 mL Per Tube BID   metoprolol tartrate  25 mg Oral BID   multivitamin  15 mL Per Tube Daily   scopolamine  1 patch Transdermal Q72H   thiamine (VITAMIN B1) injection  100 mg Intravenous Daily   Continuous Infusions:  feeding supplement (OSMOLITE 1.2 CAL) 1,000 mL (08/28/22 0150)     LOS: 13 days   Time  spent: 30  08/30/22, MD [facility] To contact the attending provider between 7A-7P or the covering provider during after hours 7P-7A, please log into the web site [email or web address] and access using universal [place] password for that web site. If you do not have the password, please call the hospital operator.  08/28/2022, 2:49 PM

## 2022-08-28 NOTE — Progress Notes (Signed)
Patient tentatively planned for G tube placement 11/16 in IR pending unforseen emergent cases which may delay procedure. IR will call for patient when available. Consult/consent completed 08/24/22.   Orders placed today for the following: - Hold tube feeds at midnight 11/16, may give meds through tube with flush - CBC/INR AM of 11/16  Please call IR with questions or concerns.  Lynnette Caffey, PA-C

## 2022-08-28 NOTE — Plan of Care (Signed)

## 2022-08-29 ENCOUNTER — Inpatient Hospital Stay (HOSPITAL_COMMUNITY): Payer: No Typology Code available for payment source

## 2022-08-29 DIAGNOSIS — I63513 Cerebral infarction due to unspecified occlusion or stenosis of bilateral middle cerebral arteries: Secondary | ICD-10-CM | POA: Diagnosis not present

## 2022-08-29 DIAGNOSIS — J159 Unspecified bacterial pneumonia: Secondary | ICD-10-CM | POA: Diagnosis not present

## 2022-08-29 HISTORY — PX: IR GASTROSTOMY TUBE MOD SED: IMG625

## 2022-08-29 LAB — BASIC METABOLIC PANEL
Anion gap: 9 (ref 5–15)
BUN: 9 mg/dL (ref 6–20)
CO2: 28 mmol/L (ref 22–32)
Calcium: 8.8 mg/dL — ABNORMAL LOW (ref 8.9–10.3)
Chloride: 97 mmol/L — ABNORMAL LOW (ref 98–111)
Creatinine, Ser: 0.71 mg/dL (ref 0.44–1.00)
GFR, Estimated: >60 mL/min (ref >60)
Glucose, Bld: 73 mg/dL (ref 70–99)
Potassium: 4.2 mmol/L (ref 3.5–5.1)
Sodium: 134 mmol/L — ABNORMAL LOW (ref 135–145)

## 2022-08-29 LAB — GLUCOSE, CAPILLARY
Glucose-Capillary: 106 mg/dL — ABNORMAL HIGH (ref 70–99)
Glucose-Capillary: 143 mg/dL — ABNORMAL HIGH (ref 70–99)
Glucose-Capillary: 153 mg/dL — ABNORMAL HIGH (ref 70–99)
Glucose-Capillary: 51 mg/dL — ABNORMAL LOW (ref 70–99)
Glucose-Capillary: 59 mg/dL — ABNORMAL LOW (ref 70–99)
Glucose-Capillary: 63 mg/dL — ABNORMAL LOW (ref 70–99)
Glucose-Capillary: 73 mg/dL (ref 70–99)
Glucose-Capillary: 74 mg/dL (ref 70–99)
Glucose-Capillary: 76 mg/dL (ref 70–99)
Glucose-Capillary: 77 mg/dL (ref 70–99)

## 2022-08-29 LAB — CBC
HCT: 28.4 % — ABNORMAL LOW (ref 36.0–46.0)
Hemoglobin: 9.7 g/dL — ABNORMAL LOW (ref 12.0–15.0)
MCH: 28.3 pg (ref 26.0–34.0)
MCHC: 34.2 g/dL (ref 30.0–36.0)
MCV: 82.8 fL (ref 80.0–100.0)
Platelets: 356 10*3/uL (ref 150–400)
RBC: 3.43 MIL/uL — ABNORMAL LOW (ref 3.87–5.11)
RDW: 16.3 % — ABNORMAL HIGH (ref 11.5–15.5)
WBC: 6.7 10*3/uL (ref 4.0–10.5)
nRBC: 0 % (ref 0.0–0.2)

## 2022-08-29 LAB — PROTIME-INR
INR: 1.1 (ref 0.8–1.2)
Prothrombin Time: 14.1 seconds (ref 11.4–15.2)

## 2022-08-29 MED ORDER — DEXTROSE-NACL 5-0.45 % IV SOLN: INTRAVENOUS | Status: AC

## 2022-08-29 MED ORDER — DEXTROSE 50 % IV SOLN
1.0000 | Freq: Once | INTRAVENOUS | Status: AC
  Administered 2022-08-29: 50 mL via INTRAVENOUS
  Filled 2022-08-29: qty 50

## 2022-08-29 MED ORDER — VANCOMYCIN HCL IN DEXTROSE 1-5 GM/200ML-% IV SOLN
1000.0000 mg | INTRAVENOUS | Status: AC
  Filled 2022-08-29: qty 200

## 2022-08-29 MED ORDER — METOPROLOL TARTRATE 25 MG PO TABS
25.0000 mg | ORAL_TABLET | Freq: Two times a day (BID) | ORAL | Status: DC
  Administered 2022-08-30 (×2): 25 mg
  Filled 2022-08-29 (×2): qty 1

## 2022-08-29 MED ORDER — GLUCAGON HCL RDNA (DIAGNOSTIC) 1 MG IJ SOLR
INTRAMUSCULAR | Status: AC | PRN
  Administered 2022-08-29: 1 mg via INTRAVENOUS

## 2022-08-29 MED ORDER — DEXTROSE 50 % IV SOLN: 12.5000 g | INTRAVENOUS | Status: AC

## 2022-08-29 MED ORDER — VANCOMYCIN HCL IN DEXTROSE 1-5 GM/200ML-% IV SOLN
INTRAVENOUS | Status: AC
  Administered 2022-08-29: 1000 mg via INTRAVENOUS
  Filled 2022-08-29: qty 200

## 2022-08-29 MED ORDER — MIDAZOLAM HCL 2 MG/2ML IJ SOLN
INTRAMUSCULAR | Status: AC
  Filled 2022-08-29: qty 2

## 2022-08-29 MED ORDER — MIDAZOLAM HCL 2 MG/2ML IJ SOLN
INTRAMUSCULAR | Status: AC | PRN
  Administered 2022-08-29 (×2): .5 mg via INTRAVENOUS

## 2022-08-29 MED ORDER — ORAL CARE MOUTH RINSE: 15.0000 mL | OROMUCOSAL | Status: DC | PRN

## 2022-08-29 MED ORDER — LIDOCAINE VISCOUS HCL 2 % MT SOLN
OROMUCOSAL | Status: AC
  Filled 2022-08-29: qty 15

## 2022-08-29 MED ORDER — ENOXAPARIN SODIUM 40 MG/0.4ML IJ SOSY
40.0000 mg | PREFILLED_SYRINGE | INTRAMUSCULAR | Status: DC
  Administered 2022-08-29 – 2022-09-01 (×4): 40 mg via SUBCUTANEOUS
  Filled 2022-08-29 (×4): qty 0.4

## 2022-08-29 MED ORDER — DEXTROSE 50 % IV SOLN
INTRAVENOUS | Status: AC
  Administered 2022-08-30: 12.5 g via INTRAVENOUS
  Filled 2022-08-29: qty 50

## 2022-08-29 MED ORDER — POTASSIUM CHLORIDE 2 MEQ/ML IV SOLN
INTRAVENOUS | Status: DC
  Filled 2022-08-29 (×6): qty 1000

## 2022-08-29 MED ORDER — IOHEXOL 300 MG/ML  SOLN
50.0000 mL | Freq: Once | INTRAMUSCULAR | Status: AC | PRN
  Administered 2022-08-29: 15 mL

## 2022-08-29 MED ORDER — DEXTROSE 50 % IV SOLN
INTRAVENOUS | Status: AC
  Administered 2022-08-29: 25 g via INTRAVENOUS
  Filled 2022-08-29: qty 50

## 2022-08-29 MED ORDER — LIDOCAINE HCL 1 % IJ SOLN
INTRAMUSCULAR | Status: AC
  Administered 2022-08-29: 10 mL
  Filled 2022-08-29: qty 20

## 2022-08-29 MED ORDER — GLUCAGON HCL RDNA (DIAGNOSTIC) 1 MG IJ SOLR
INTRAMUSCULAR | Status: AC
  Filled 2022-08-29: qty 1

## 2022-08-29 MED ORDER — DEXTROSE 50 % IV SOLN: 25.0000 g | INTRAVENOUS | Status: AC

## 2022-08-29 MED ORDER — ORAL CARE MOUTH RINSE
15.0000 mL | OROMUCOSAL | Status: DC
  Administered 2022-08-29 – 2022-09-02 (×16): 15 mL via OROMUCOSAL

## 2022-08-29 NOTE — Progress Notes (Signed)
CBG @0027  106. CBG @0452  73. On-call for attending, Dr. notified 2/2 pt's tube feeds being held for procedure and concern for future hypoglycemia. 1 amp D50 ordered and administered with instructions to repeat CBG 1 hour after. Repeat CBG @0620  153. MD updated. No new orders. Will continue to monitor and convey to Day Team.

## 2022-08-29 NOTE — Progress Notes (Signed)
PROGRESS NOTE   Cindy Brooks  HGD:[id number] DOB: 11/07/1970 DOA: 08/14/2022 PCP: Hoy Register, MD   Brief Narrative:   6 yr blk female largely bedbound since April Antiphospholipid AB with hypercoag state Chr dysphagia htn Bed-bound form prior CVA 01/22/22 Multiple subsequent admits 03/2022 for AMS, 04/2022 for UTI  Came to Ed with altered mental status on 08/15/22 Tachy 140, lactic 2.3, WBC 7, Sod 149 Admitted with likely aspiration pneumonia and severe sepsis and placed on broad-spectrum antibiotics initially  11/3 palliative care consulted 11/4 EEG shows encephalopathy with encephalopathy 11/6 pulmonary consulted for left lower lobe partial postobstructive collapse and mediastinal shift they recommended placing patient right lung down to facilitate pulmonary drainage--felt like she was going to be at high risk for aspiration regardless 11/7 neurology consulted secondary to stroke found on MRI 11/10 core track placed feeds resumed as NG abdomen postoperative times 11/16 PEG inserted by IR  Hospital-Problem based course  Severe sepsis on admission secondary to aspiration pneumonia - CT on 11/2 showed mucoid debris in left-sided airways left overall lower and left upper lobe - Completed 7-day course of antibiotics 11/7 - SLP feels she is not safe to eat --remains unsafe on reevaluation 11/30 by SLP - See below - Periodic chest x-ray --last on 11/11 shows persisting LLL infiltrate associated with low-grade fevers, family wants everything done  UTI from prior to admission likely secondary to indwelling Foley - Patient completed antibiotics  Chronic dysphagia - Not safe to eat core track in place -  PEG tube to be placed 11/16, hopefully can be started on feed  Hypoglycemia -Patient pulled out NG tube on 11/9 -Continue with D5 half NS, with close CBG monitoring, hopefully can decrease tomorrow when she is back on tube feed  Acute metabolic encephalopathy, underlying prior  CVA Possible encephalopathy from multiple meds - Gabapentin and Cymbalta other meds all held-mentation improved over the next several days - Neurology consulted and recommending Brilinta for 4 weeks then aspirin alone no further work-up--both have been held for placement of PEG. - Metabolic work-up ammonia TSH B12 all normal - Patient is more interactive verbalizing only a little -Discussed with IR, okay to resume Brilinta tomorrow  Hyponatremia acute metabolic acidosis hypokalemia hypomagnesemia - Metabolic derangements have resolved - Periodic labs  Anemia of chronic disease with iatrogenic blood loss - Elevated ferritin level. - 1 Unit PRBC 11/4, with good response, hemoglobin at 9.2. -Gross GI bleed  Stage II right buttock pressure ulcer present from admission - Continue local wound care   DVT prophylaxis: Lovenox  Code Status: Full Family Communication: None at bedside Disposition:  Status is: Inpatient Remains inpatient appropriate because:   Remains critically ill, but has improved slightly with mentation   Consultants:  Neurology Pulmonology  Procedures: Multiple  Antimicrobials: Completed   Subjective:  Low CBG readings, she is currently on D5 half NS.  Objective: Vitals:   08/29/22 1330 08/29/22 1335 08/29/22 1355 08/29/22 1410  BP: 106/83 (!) 136/102 (!) 137/100 120/83  Pulse: 90 92 93   Resp: 19 17 16 14   Temp:      TempSrc:      SpO2: 100% 98% 98% 96%  Weight:      Height:        Intake/Output Summary (Last 24 hours) at 08/29/2022 1430 Last data filed at 08/29/2022 0841 Gross per 24 hour  Intake 466 ml  Output 1150 ml  Net -684 ml   Filed Weights   08/26/22 0500 08/28/22 0510 08/29/22 0500  Weight: 53.5 kg 51.7 kg 53.4 kg    Examination:  Sleeping comfortably this morning, Symmetrical Chest wall movement, Good air movement bilaterally, CTAB RRR,No Gallops,Rubs or new Murmurs, No Parasternal Heave +ve B.Sounds, Abd Soft, No  tenderness, No rebound - guarding or rigidity. No Cyanosis, Clubbing or edema, No new Rash or bruise    Data Reviewed: personally reviewed   CBC    Component Value Date/Time   WBC 6.7 08/29/2022 0448   RBC 3.43 (L) 08/29/2022 0448   HGB 9.7 (L) 08/29/2022 0448   HGB 11.1 (L) 06/14/2022 1040   HCT 28.4 (L) 08/29/2022 0448   PLT 356 08/29/2022 0448   PLT 244 06/14/2022 1040   MCV 82.8 08/29/2022 0448   MCH 28.3 08/29/2022 0448   MCHC 34.2 08/29/2022 0448   RDW 16.3 (H) 08/29/2022 0448   LYMPHSABS 2.2 08/28/2022 0817   MONOABS 0.8 08/28/2022 0817   EOSABS 0.1 08/28/2022 0817   BASOSABS 0.0 08/28/2022 0817      Latest Ref Rng & Units 08/29/2022    4:48 AM 08/28/2022    8:17 AM 08/27/2022    8:45 AM  CMP  Glucose 70 - 99 mg/dL 73  118  91   BUN 6 - 20 mg/dL 9  9  7    Creatinine 0.44 - 1.00 mg/dL 0.71  0.71  0.68   Sodium 135 - 145 mmol/L 134  139  140   Potassium 3.5 - 5.1 mmol/L 4.2  4.9  4.6   Chloride 98 - 111 mmol/L 97  98  101   CO2 22 - 32 mmol/L 28  31  30    Calcium 8.9 - 10.3 mg/dL 8.8  8.7  8.5   Total Protein 6.5 - 8.1 g/dL   5.1   Total Bilirubin 0.3 - 1.2 mg/dL   0.4   Alkaline Phos 38 - 126 U/L   98   AST 15 - 41 U/L   26   ALT 0 - 44 U/L   22      Radiology Studies: No results found.   Scheduled Meds:  amantadine  100 mg Per Tube BID   atorvastatin  80 mg Per Tube Daily   capsaicin   Topical BID   Chlorhexidine Gluconate Cloth  6 each Topical Daily   folic acid  1 mg Per Tube Daily   free water  100 mL Per Tube Q6H   glucagon (human recombinant)       guaiFENesin  10 mL Per Tube BID   lidocaine       metoprolol tartrate  25 mg Oral BID   midazolam       multivitamin  15 mL Per Tube Daily   mouth rinse  15 mL Mouth Rinse 4 times per day   scopolamine  1 patch Transdermal Q72H   thiamine (VITAMIN B1) injection  100 mg Intravenous Daily   Continuous Infusions:  dextrose 5 % and 0.45% NaCl 50 mL/hr at 08/29/22 1007   feeding supplement  (OSMOLITE 1.2 CAL) Stopped (08/29/22 0006)     LOS: 14 days     Phillips Climes, MD [facility] To contact the attending provider between 7A-7P or the covering provider during after hours 7P-7A, please log into the web site [email or web address] and access using universal [place] password for that web site. If you do not have the password, please call the hospital operator.  08/29/2022, 2:30 PM

## 2022-08-29 NOTE — Procedures (Signed)
Vascular and Interventional Radiology Procedure Note  Patient: Cindy Brooks DOB: 1971-08-03 Medical Record Number: [id number] Note Date/Time: 08/29/22 1:53 PM   Performing Physician: Roanna Banning, MD Assistant(s): None  Diagnosis: Dsyphagia  Procedure: PERCUTANEOUS GASTROSTOMY TUBE PLACEMENT  Anesthesia: Conscious Sedation Complications: None Estimated Blood Loss: Minimal  Findings:  Successful placement of a 66F gastrostomy tube under fluoroscopy.   See detailed procedure note with images in PACS. The patient tolerated the procedure well without incident or complication and was returned to Recovery in stable condition.    Roanna Banning, MD [facility] Radiology   Pager. [phone] Clinic. [phone]

## 2022-08-29 NOTE — Progress Notes (Signed)
   Chart reviewed including progress notes, labs, imaging. Patient is scheduled for PEG placement today, consistent with clear goals for full scope treatment. Family has been provided with PMT contact information and has been encouraged to call with any further palliative needs. No further needs identified at this time. Patient would benefit from outpatient palliative care referral if family is agreeable.   PMT will continue to follow peripherally. Please secure chat or call team line with urgent needs.   Richardson Dopp, [facility] Palliative Medicine Team  Team Phone # [phone]   NO CHARGE

## 2022-08-30 ENCOUNTER — Inpatient Hospital Stay (HOSPITAL_COMMUNITY): Payer: No Typology Code available for payment source

## 2022-08-30 DIAGNOSIS — J159 Unspecified bacterial pneumonia: Secondary | ICD-10-CM | POA: Diagnosis not present

## 2022-08-30 DIAGNOSIS — R1310 Dysphagia, unspecified: Secondary | ICD-10-CM | POA: Diagnosis not present

## 2022-08-30 LAB — BASIC METABOLIC PANEL
Anion gap: 11 (ref 5–15)
BUN: 6 mg/dL (ref 6–20)
CO2: 27 mmol/L (ref 22–32)
Calcium: 9 mg/dL (ref 8.9–10.3)
Chloride: 100 mmol/L (ref 98–111)
Creatinine, Ser: 0.82 mg/dL (ref 0.44–1.00)
GFR, Estimated: >60 mL/min (ref >60)
Glucose, Bld: 97 mg/dL (ref 70–99)
Potassium: 4.1 mmol/L (ref 3.5–5.1)
Sodium: 138 mmol/L (ref 135–145)

## 2022-08-30 LAB — GLUCOSE, CAPILLARY
Glucose-Capillary: 106 mg/dL — ABNORMAL HIGH (ref 70–99)
Glucose-Capillary: 107 mg/dL — ABNORMAL HIGH (ref 70–99)
Glucose-Capillary: 108 mg/dL — ABNORMAL HIGH (ref 70–99)
Glucose-Capillary: 115 mg/dL — ABNORMAL HIGH (ref 70–99)
Glucose-Capillary: 62 mg/dL — ABNORMAL LOW (ref 70–99)
Glucose-Capillary: 75 mg/dL (ref 70–99)
Glucose-Capillary: 81 mg/dL (ref 70–99)
Glucose-Capillary: 85 mg/dL (ref 70–99)
Glucose-Capillary: 92 mg/dL (ref 70–99)
Glucose-Capillary: 97 mg/dL (ref 70–99)
Glucose-Capillary: 99 mg/dL (ref 70–99)
Glucose-Capillary: 99 mg/dL (ref 70–99)

## 2022-08-30 LAB — CBC
HCT: 25.9 % — ABNORMAL LOW (ref 36.0–46.0)
Hemoglobin: 9.3 g/dL — ABNORMAL LOW (ref 12.0–15.0)
MCH: 29.3 pg (ref 26.0–34.0)
MCHC: 35.9 g/dL (ref 30.0–36.0)
MCV: 81.7 fL (ref 80.0–100.0)
Platelets: 354 10*3/uL (ref 150–400)
RBC: 3.17 MIL/uL — ABNORMAL LOW (ref 3.87–5.11)
RDW: 16.4 % — ABNORMAL HIGH (ref 11.5–15.5)
WBC: 6.3 10*3/uL (ref 4.0–10.5)
nRBC: 0 % (ref 0.0–0.2)

## 2022-08-30 MED ORDER — TICAGRELOR 90 MG PO TABS
90.0000 mg | ORAL_TABLET | Freq: Two times a day (BID) | ORAL | Status: DC
  Administered 2022-08-30 – 2022-09-02 (×7): 90 mg
  Filled 2022-08-30 (×7): qty 1

## 2022-08-30 MED ORDER — THIAMINE MONONITRATE 100 MG PO TABS
100.0000 mg | ORAL_TABLET | Freq: Every day | ORAL | Status: DC
  Administered 2022-08-31 – 2022-09-02 (×3): 100 mg
  Filled 2022-08-30 (×3): qty 1

## 2022-08-30 MED ORDER — ADULT MULTIVITAMIN W/MINERALS CH
1.0000 | ORAL_TABLET | Freq: Every day | ORAL | Status: DC
  Administered 2022-08-30 – 2022-09-02 (×4): 1
  Filled 2022-08-30 (×4): qty 1

## 2022-08-30 MED ORDER — HYDRALAZINE HCL 20 MG/ML IJ SOLN: 5.0000 mg | Freq: Four times a day (QID) | INTRAMUSCULAR | Status: DC | PRN

## 2022-08-30 MED ORDER — DEXTROSE 50 % IV SOLN
12.5000 g | INTRAVENOUS | Status: AC
  Administered 2022-08-30: 12.5 g via INTRAVENOUS

## 2022-08-30 MED ORDER — FREE WATER
150.0000 mL | Freq: Four times a day (QID) | Status: DC
  Administered 2022-08-30 – 2022-09-02 (×11): 150 mL

## 2022-08-30 NOTE — Progress Notes (Signed)
PROGRESS NOTE   Cindy Brooks  [id number] DOB: Sep 16, 1971 DOA: 08/14/2022 PCP: Cindy Rakes, MD   Brief Narrative:   82 yr blk female largely bedbound since April Antiphospholipid AB with hypercoag state Chr dysphagia htn Bed-bound form prior CVA 01/22/22 Multiple subsequent admits 03/2022 for AMS, 04/2022 for UTI  Came to Ed with altered mental status on 08/15/22 Tachy 140, lactic 2.3, WBC 7, Sod 149 Admitted with likely aspiration pneumonia and severe sepsis and placed on broad-spectrum antibiotics initially  11/3 palliative care consulted 11/4 EEG shows encephalopathy with encephalopathy 11/6 pulmonary consulted for left lower lobe partial postobstructive collapse and mediastinal shift they recommended placing patient right lung down to facilitate pulmonary drainage--felt like she was going to be at high risk for aspiration regardless 11/7 neurology consulted secondary to stroke found on MRI 11/10 core track placed feeds resumed as NG abdomen postoperative times 11/16 PEG inserted by IR  Hospital-Problem based course  Severe sepsis on admission secondary to aspiration pneumonia - CT on 11/2 showed mucoid debris in left-sided airways left overall lower and left upper lobe - Completed 7-day course of antibiotics 11/7  UTI from prior to admission likely secondary to indwelling Foley - Patient completed antibiotics  Chronic dysphagia -Initially with tube feed via core track, PEG tube to be placed 11/16, feed to be started today. -He has been followed closely by SLP as well, she is currently on dysphagia 1 diet with thin liquids  Hypoglycemia -Now she is on tube feed hopefully can DC her D5 half NS by tomorrow  Acute metabolic encephalopathy, underlying prior CVA Possible encephalopathy from multiple meds - Gabapentin and Cymbalta other meds all held-mentation improved over the next several days - Neurology consulted and recommending Brilinta for 4 weeks then aspirin  alone no further work-up--both have been held for placement of PEG. - Metabolic work-up ammonia TSH B12 all normal - Patient is more interactive verbalizing only a little -Discussed with IR, okay to resume Brilinta   Hyponatremia acute metabolic acidosis hypokalemia hypomagnesemia - Metabolic derangements have resolved - Periodic labs  Anemia of chronic disease with iatrogenic blood loss - Elevated ferritin level. - 1 Unit PRBC 11/14, with good response, hemoglobin been stable. -no evidence of GI bleed   Stage II right buttock pressure ulcer present from admission - Continue local wound care   DVT prophylaxis: Lovenox  Code Status: Full Family Communication: None at bedside, D/W daughter by phone. Disposition:  Status is: Inpatient Remains inpatient appropriate because:   Remains critically ill, but has improved slightly with mentation   Consultants:  Neurology Pulmonology  Procedures: Multiple  Antimicrobials: Completed   Subjective:  No significant events overnight as discussed with staff  Objective: Vitals:   08/30/22 0422 08/30/22 0600 08/30/22 0700 08/30/22 0749  BP:      Pulse:  84 87   Resp:  16 (!) 23   Temp:    98.7 F (37.1 C)  TempSrc:    Axillary  SpO2:  100% 100%   Weight: 51.8 kg     Height:        Intake/Output Summary (Last 24 hours) at 08/30/2022 1521 Last data filed at 08/30/2022 1411 Gross per 24 hour  Intake 214.22 ml  Output 1600 ml  Net -1385.78 ml   Filed Weights   08/28/22 0510 08/29/22 0500 08/30/22 0422  Weight: 51.7 kg 53.4 kg 51.8 kg    Examination:  Awake, oriented x1, impaired cognition and insight. Symmetrical Chest wall movement, Good air movement bilaterally,  CTAB RRR,No Gallops,Rubs or new Murmurs, No Parasternal Heave +ve B.Sounds, Abd Soft, No tenderness, No rebound - guarding or rigidity. No Cyanosis, Clubbing or edema, No new Rash or bruise    Data Reviewed: personally reviewed   CBC    Component Value  Date/Time   WBC 6.3 08/30/2022 0613   RBC 3.17 (L) 08/30/2022 0613   HGB 9.3 (L) 08/30/2022 0613   HGB 11.1 (L) 06/14/2022 1040   HCT 25.9 (L) 08/30/2022 0613   PLT 354 08/30/2022 0613   PLT 244 06/14/2022 1040   MCV 81.7 08/30/2022 0613   MCH 29.3 08/30/2022 0613   MCHC 35.9 08/30/2022 0613   RDW 16.4 (H) 08/30/2022 0613   LYMPHSABS 2.2 08/28/2022 0817   MONOABS 0.8 08/28/2022 0817   EOSABS 0.1 08/28/2022 0817   BASOSABS 0.0 08/28/2022 0817      Latest Ref Rng & Units 08/30/2022    6:13 AM 08/29/2022    4:48 AM 08/28/2022    8:17 AM  CMP  Glucose 70 - 99 mg/dL 97  73  118   BUN 6 - 20 mg/dL 6  9  9    Creatinine 0.44 - 1.00 mg/dL 0.82  0.71  0.71   Sodium 135 - 145 mmol/L 138  134  139   Potassium 3.5 - 5.1 mmol/L 4.1  4.2  4.9   Chloride 98 - 111 mmol/L 100  97  98   CO2 22 - 32 mmol/L 27  28  31    Calcium 8.9 - 10.3 mg/dL 9.0  8.8  8.7      Radiology Studies: DG Swallowing Func-Speech Pathology  Result Date: 08/30/2022 Table formatting from the original result was not included. Objective Swallowing Evaluation: Type of Study: MBS-Modified Barium Swallow Study  Patient Details Name: Cindy Brooks MRN: [id number] Date of Birth: Feb 17, 1971 Today's Date: 08/30/2022 Time: SLP Start Time (ACUTE ONLY): [id number] -SLP Stop Time (ACUTE ONLY): 1340 SLP Time Calculation (min) (ACUTE ONLY): 19 min Past Medical History: Past Medical History: Diagnosis Date  Hypertension   Hypertensive urgency 01/20/2022  Seizure ([place]) 01/28/2022  Stroke ([place])   TIA (transient ischemic attack) 2019  Urinary tract infection 01/28/2022 Past Surgical History: Past Surgical History: Procedure Laterality Date  ESOPHAGOGASTRODUODENOSCOPY N/A 01/28/2022  Procedure: ESOPHAGOGASTRODUODENOSCOPY (EGD);  Surgeon: Jesusita Oka, MD;  Location: [facility] ENDOSCOPY;  Service: General;  Laterality: N/A;  IR GASTROSTOMY TUBE MOD SED  08/29/2022  PEG PLACEMENT N/A 01/28/2022  Procedure: PERCUTANEOUS ENDOSCOPIC GASTROSTOMY (PEG)  PLACEMENT;  Surgeon: Jesusita Oka, MD;  Location: [facility] ENDOSCOPY;  Service: General;  Laterality: N/A; HPI: Cindy Brooks is a 51 y.o. female with medical history significant of recurrent UTI who presents emergency department due to cough and altered mental status.  Patient is nonverbal at baseline and bedbound due to prior stroke.  She had a two-month hospitalization in April of 2023 due to bilateral thalamic, midbrain, and bilateral cerebral infarctions. She had trach/PEG at that time and dysphagia and cognitive/language therapies. Chest CT reported "Mucoid debris in the left-sided airways with near-complete postobstructive collapse of the left lower lobe and partial postobstructive collapse of the left upper lobe, with significant associated volume loss and leftward mediastinal shift."  Most recent BSE on 03/25/22 with recommendations for Dysphagia 2 solids and thin liquids.  Subjective: pt alert but minimally verbal  Recommendations for follow up therapy are one component of a multi-disciplinary discharge planning process, led by the attending physician.  Recommendations may be updated based on patient status, additional functional criteria  and insurance authorization. Assessment / Plan / Recommendation   08/30/2022   1:00 PM Clinical Impressions Clinical Impression Pt presents with an oral more than pharyngeal dysphagia. She has does not open her lips and mouth far enough to accept a spoon, although the tip of a spoon can be placed in her mouth for pt to more passively receive purees. As a result she gets fairly small boluses at a time despite cues and assistance provided to try to increase bolus size. Posterior lingual propulsion is slow to initiate especially with purees, with SLP providing repeated cues per bolus to swallow. She could not clear the barium tablet from her oral cavity nor could she masticate it, so she had to expectorate it. Pharyngeally she has some reduced anterior hyolaryngeal movement but  her efficiency and safety appear to be intact, in that there is no overt residue and no aspiration. Recommend that pt begin Dys 1 (puree) diet and thin liquids. Would also note that risk for aspiration on this diet could potentially fluctuate in the setting of variable alertness/mentation as well as suboptimal positioning. PO intake especially of solids (purees) may be effortful and time consuming so use of PEG will likely still be beneficial. SLP Visit Diagnosis Dysphagia, oropharyngeal phase (R13.12) Impact on safety and function Mild aspiration risk;Moderate aspiration risk;Risk for inadequate nutrition/hydration     08/30/2022   1:00 PM Treatment Recommendations Treatment Recommendations Therapy as outlined in treatment plan below     08/30/2022   1:00 PM Prognosis Prognosis for Safe Diet Advancement Fair Barriers to Reach Goals Cognitive deficits;Language deficits   08/30/2022   1:00 PM Diet Recommendations SLP Diet Recommendations Dysphagia 1 (Puree) solids;Thin liquid Liquid Administration via Straw;Cup Medication Administration Crushed with puree Compensations Minimize environmental distractions;Slow rate;Small sips/bites;Other (Comment) Postural Changes Seated upright at 90 degrees     08/30/2022   1:00 PM Other Recommendations Oral Care Recommendations Oral care BID Other Recommendations Have oral suction available Follow Up Recommendations Home health SLP Functional Status Assessment Patient has had a recent decline in their functional status and demonstrates the ability to make significant improvements in function in a reasonable and predictable amount of time.   08/30/2022   1:00 PM Frequency and Duration  Speech Therapy Frequency (ACUTE ONLY) min 2x/week Treatment Duration 2 weeks     08/30/2022   1:00 PM Oral Phase Oral Phase Impaired Oral - Thin Cup Holding of bolus;Reduced posterior propulsion;Lingual/palatal residue Oral - Thin Straw Holding of bolus;Reduced posterior propulsion;Lingual/palatal  residue Oral - Puree Weak lingual manipulation;Reduced posterior propulsion;Delayed oral transit;Other (Comment) Oral - Pill Reduced posterior propulsion;Lingual/palatal residue;Other (Comment)    08/30/2022   1:00 PM Pharyngeal Phase Pharyngeal Phase Impaired Pharyngeal- Thin Cup Reduced anterior laryngeal mobility Pharyngeal- Thin Straw Reduced anterior laryngeal mobility Pharyngeal- Puree [facility]    08/30/2022   1:00 PM Cervical Esophageal Phase  Cervical Esophageal Phase -- Osie Bond., M.A. [place] Office [phone] Secure chat preferred 08/30/2022, 2:15 PM                     IR GASTROSTOMY TUBE MOD SED  Result Date: 08/29/2022 INDICATION: Dysphagia. EXAM: PERCUTANEOUS GASTROSTOMY TUBE PLACEMENT COMPARISON:  CT AP, 08/23/2022. MEDICATIONS: Vancomycin 1 gm IV; Antibiotics were administered within 1 hour of the procedure. CONTRAST:  15 mL of Isovue 300 administered into the gastric lumen. ANESTHESIA/SEDATION: Local anesthetic and single agent sedation was employed during this procedure. A total of Versed 1 mg was administered intravenously. The patient's level  of consciousness and vital signs were monitored continuously by radiology nursing throughout the procedure under my direct supervision. FLUOROSCOPY TIME:  Fluoroscopic dose; 1 mGy COMPLICATIONS: None immediate. PROCEDURE: Informed written consent was obtained from the patient's family following explanation of the procedure, risks, benefits and alternatives. A time out was performed prior to the initiation of the procedure. Maximal barrier sterile technique utilized including caps, mask, sterile gowns, sterile gloves, large sterile drape, hand hygiene and chlorhexidine prep. The left upper quadrant was sterilely prepped and draped. A oral gastric catheter was inserted into the stomach under fluoroscopy. The existing nasogastric feeding tube was removed. The left costal margin and barium opacified transverse colon were  identified and avoided. Air was injected into the stomach for insufflation and visualization under fluoroscopy. Under sterile conditions and local anesthesia, 2 T tacks were utilized to pexy the anterior aspect of the stomach against the ventral abdominal wall. Contrast injection confirmed appropriate positioning of each of the T tacks. An incision was made between the T tacks and a 17 gauge trocar needle was utilized to access the stomach. Needle position was confirmed within the stomach with aspiration of air and injection of a small amount of contrast. A stiff Glidewire was advanced into the gastric lumen and under intermittent fluoroscopic guidance, the access needle was exchanged for a telescoping peel-away sheath, ultimately allowing placement of a 18-French balloon retention gastrostomy tube. The retention balloon was insufflated with a mixture of dilute saline and contrast and pulled taut against the anterior wall of the stomach. The external disc was cinched. Contrast injection confirms positioning within the stomach. Several spot radiographic images were obtained in various obliquities for documentation. T he patient tolerated procedure well without immediate post procedural complication. FINDINGS: After successful fluoroscopic guided placement, the gastrostomy tube is appropriately positioned with internal retention balloon against the ventral aspect of the gastric lumen. IMPRESSION: Successful fluoroscopic insertion of percutaneous 16 Fr balloon-retention gastrostomy tube. PLAN: The gastrostomy may be used immediately for medication administration and in 24 hrs for the initiation of feeds. Michaelle Birks, MD Vascular and Interventional Radiology Specialists [facility] Radiology Electronically Signed   By: Michaelle Birks M.D.   On: 08/29/2022 20:31     Scheduled Meds:  amantadine  100 mg Per Tube BID   atorvastatin  80 mg Per Tube Daily   capsaicin   Topical BID   Chlorhexidine Gluconate Cloth  6 each  Topical Daily   enoxaparin (LOVENOX) injection  40 mg Subcutaneous [id number]   folic acid  1 mg Per Tube Daily   free water  150 mL Per Tube Q6H   guaiFENesin  10 mL Per Tube BID   metoprolol tartrate  25 mg Per Tube BID   multivitamin with minerals  1 tablet Per Tube Daily   mouth rinse  15 mL Mouth Rinse 4 times per day   scopolamine  1 patch Transdermal Q72H   [START ON 08/31/2022] thiamine  100 mg Per Tube Daily   ticagrelor  90 mg Per Tube BID   Continuous Infusions:  dextrose 10 % and 0.45 % NaCl 1,000 mL with potassium chloride 40 mEq infusion 100 mL/hr at 08/30/22 1225   feeding supplement (OSMOLITE 1.2 CAL) Stopped (08/29/22 0006)     LOS: 15 days     Phillips Climes, MD [facility] To contact the attending provider between 7A-7P or the covering provider during after hours 7P-7A, please log into the web site [email or web address] and access using universal [place] password for that  web site. If you do not have the password, please call the hospital operator.  08/30/2022, 3:21 PM

## 2022-08-30 NOTE — TOC Progression Note (Signed)
Transition of Care [facility]) - Progression Note    Patient Details  Name: Cindy Brooks MRN: [id number] Date of Birth: 20-Dec-1970  Transition of Care (TOC) CM/SW Contact  Harriet Masson, RN Phone Number: 08/30/2022, 2:26 PM  Clinical Narrative:    Dietitian recommendations are Tube feed via Cortrak: Start Osmolite 1.2 at 30 mL/hr and advance by 10 mL q4h to goal rate of 60 mL/hr (1440 mL per day) 150 mL free water flush q6h   Pam with Shayne Alken is aware of tube feed needs and will reassess Monday since the plan is to transition to bolus feeds.   Expected Discharge Plan: Home w Home Health Services Barriers to Discharge: Continued Medical Work up  Expected Discharge Plan and Services Expected Discharge Plan: Home w Home Health Services   Discharge Planning Services: CM Consult Post Acute Care Choice: Durable Medical Equipment, Home Health Living arrangements for the past 2 months: Single Family Home                           HH Arranged: OT, RN, Speech Therapy HH Agency: [facility] Date HH Agency Contacted: 08/23/22 Time HH Agency Contacted: [id number] Representative spoke with at [facility] Agency: Morrie Sheldon   Social Determinants of Health (SDOH) Interventions    Readmission Risk Interventions    05/28/2022    2:27 PM 03/13/2022    9:03 AM  Readmission Risk Prevention Plan  Transportation Screening Complete Complete  PCP or Specialist Appt within 5-7 Days  Complete  PCP or Specialist Appt within 3-5 Days Complete   Home Care Screening  Complete  Medication Review (RN CM)  Complete  HRI or Home Care Consult Complete   Social Work Consult for Recovery Care Planning/Counseling Complete   Palliative Care Screening Not Applicable   Medication Review Oceanographer) Complete

## 2022-08-30 NOTE — Progress Notes (Signed)
Hypoglycemic Event  CBG: 59  Treatment: D50 25 mL (12.5 gm)  Symptoms: None  Follow-up CBG: Time:0024 CBG Result:115  Possible Reasons for Event: Inadequate meal intake  Comments/MD notified:Chen    [employer]

## 2022-08-30 NOTE — TOC Progression Note (Signed)
Transition of Care [facility]) - Progression Note    Patient Details  Name: Shaunta Oncale MRN: [id number] Date of Birth: 1971/04/08  Transition of Care [facility]) CM/SW Contact  Harriet Masson, RN Phone Number: 08/30/2022, 4:12 PM  Clinical Narrative:     Sent secure text message requesting a return call due to VM being full.   Expected Discharge Plan: Home w Home Health Services Barriers to Discharge: Continued Medical Work up  Expected Discharge Plan and Services Expected Discharge Plan: Home w Home Health Services   Discharge Planning Services: CM Consult Post Acute Care Choice: Durable Medical Equipment, Home Health Living arrangements for the past 2 months: Single Family Home                           HH Arranged: OT, RN, Speech Therapy HH Agency: [facility] Date HH Agency Contacted: 08/23/22 Time HH Agency Contacted: [phone] Representative spoke with at [facility] Agency: Morrie Sheldon   Social Determinants of Health (SDOH) Interventions    Readmission Risk Interventions    05/28/2022    2:27 PM 03/13/2022    9:03 AM  Readmission Risk Prevention Plan  Transportation Screening Complete Complete  PCP or Specialist Appt within 5-7 Days  Complete  PCP or Specialist Appt within 3-5 Days Complete   Home Care Screening  Complete  Medication Review (RN CM)  Complete  HRI or Home Care Consult Complete   Social Work Consult for Recovery Care Planning/Counseling Complete   Palliative Care Screening Not Applicable   Medication Review Oceanographer) Complete

## 2022-08-30 NOTE — Progress Notes (Signed)
Hypoglycemic Event  CBG: 51  Treatment: D50 50 mL (25 gm)  Symptoms: None  Follow-up CBG Time 2204: CBG Result:143  Possible Reasons for Event: Inadequate meal intake  Comments/MD notified:Chen    [employer]

## 2022-08-30 NOTE — Plan of Care (Signed)
  Problem: Education: Goal: Knowledge of General Education information will improve Description: Including pain rating scale, medication(s)/side effects and non-pharmacologic comfort measures Outcome: Not Progressing   Problem: Health Behavior/Discharge Planning: Goal: Ability to manage health-related needs will improve Outcome: Not Progressing   Problem: Activity: Goal: Risk for activity intolerance will decrease Outcome: Not Progressing   Problem: Nutrition: Goal: Adequate nutrition will be maintained Outcome: Not Progressing   Problem: Elimination: Goal: Will not experience complications related to bowel motility Outcome: Progressing Goal: Will not experience complications related to urinary retention Outcome: Progressing

## 2022-08-30 NOTE — Progress Notes (Signed)
Modified Barium Swallow Progress Note  Patient Details  Name: Cindy Brooks MRN: [id number] Date of Birth: 14-Jul-1971  Today's Date: 08/30/2022  Modified Barium Swallow completed.  Full report located under Chart Review in the Imaging Section.  Brief recommendations include the following:  Clinical Impression  Pt presents with an oral more than pharyngeal dysphagia. She has does not open her lips and mouth far enough to accept a spoon, although the tip of a spoon can be placed in her mouth for pt to more passively receive purees. As a result she gets fairly small boluses at a time despite cues and assistance provided to try to increase bolus size. Posterior lingual propulsion is slow to initiate especially with purees, with SLP providing repeated cues per bolus to swallow. She could not clear the barium tablet from her oral cavity nor could she masticate it, so she had to expectorate it. Pharyngeally she has some reduced anterior hyolaryngeal movement but her efficiency and safety appear to be intact, in that there is no overt residue and no aspiration. Recommend that pt begin Dys 1 (puree) diet and thin liquids. Would also note that risk for aspiration on this diet could potentially fluctuate in the setting of variable alertness/mentation as well as suboptimal positioning. PO intake especially of solids (purees) may be effortful and time consuming so use of PEG will likely still be beneficial.   Swallow Evaluation Recommendations   SLP Diet Recommendations: Dysphagia 1 (Puree) solids;Thin liquid   Liquid Administration via: Straw;Cup   Medication Administration: Crushed with puree (or by tube)   Supervision: Staff to assist with self feeding;Full supervision/cueing for compensatory strategies   Compensations: Minimize environmental distractions;Slow rate;Small sips/bites;Other (Comment) (make sure she swallows in between bites' cue to swallow PRN)   Postural Changes: Seated upright at 90  degrees   Oral Care Recommendations: Oral care BID   Other Recommendations: Have oral suction available    Mahala Menghini., M.A. CCC-SLP Acute Rehabilitation Services Office [phone]  Secure chat preferred  08/30/2022,2:14 PM

## 2022-08-30 NOTE — Progress Notes (Signed)
Supervising Physician: Malachy Moan  Patient Status:  [facility] - In-pt  Chief Complaint:  1 day s/p percutaneous gastrostomy with Dr. Milford Cage  Subjective:  Reports feeling better than she did yesterday.  Allergies: Aspirin, Codeine, Penicillins, and Strawberry extract  Medications: Prior to Admission medications   Medication Sig Start Date End Date Taking? Authorizing Provider  acetaminophen (TYLENOL) 325 MG tablet Take 2 tablets (650 mg total) by mouth every 4 (four) hours as needed. Patient taking differently: Take 650 mg by mouth every 4 (four) hours as needed for mild pain. 03/21/22 03/21/23 Yes Almon Hercules, MD  atorvastatin (LIPITOR) 80 MG tablet Take 1 tablet (80 mg total) by mouth daily. 05/14/22  Yes Hoy Register, MD  chlorhexidine (PERIDEX) 0.12 % solution Use as directed 15 mLs in the mouth or throat 2 (two) times daily. 05/28/22  Yes Alwyn Ren, MD  DULoxetine (CYMBALTA) 60 MG capsule Take 1 capsule (60 mg total) by mouth daily. 05/14/22  Yes Newlin, Odette Horns, MD  Ensure Plus (ENSURE PLUS) LIQD Take 237 mLs by mouth 3 (three) times daily between meals.   Yes [provider]  folic acid (FOLVITE) 1 MG tablet Take 1 tablet (1 mg total) by mouth daily. 03/21/22  Yes Almon Hercules, MD  gabapentin (NEURONTIN) 100 MG capsule Take 1 capsule (100 mg total) by mouth 2 (two) times daily. 05/14/22  Yes Hoy Register, MD  hydrALAZINE (APRESOLINE) 50 MG tablet Take 50 mg by mouth every 8 (eight) hours. 07/06/22  Yes [provider]  ipratropium-albuterol (DUONEB) 0.5-2.5 (3) MG/3ML SOLN Take 3 mLs by nebulization every 6 (six) hours as needed. Patient taking differently: Take 3 mLs by nebulization every 6 (six) hours as needed (SOB). 03/21/22  Yes Almon Hercules, MD  Multiple Vitamin (MULTIVITAMIN WITH MINERALS) TABS tablet Take 1 tablet by mouth daily. 03/21/22  Yes Almon Hercules, MD  Nystatin (GERHARDT'S BUTT CREAM) CREA Apply 1 application. topically daily as  needed for irritation. 03/21/22  Yes Almon Hercules, MD  nystatin (MYCOSTATIN) 100000 UNIT/ML suspension Take 5 mLs (500,000 Units total) by mouth 4 (four) times daily. 05/28/22  Yes Alwyn Ren, MD  propranolol (INDERAL) 20 MG tablet Take 20 mg by mouth 3 (three) times daily.   Yes [provider]  senna-docusate (SENOKOT-S) 8.6-50 MG tablet Take 1 tablet by mouth 2 (two) times daily between meals as needed for mild constipation. 03/21/22  Yes Almon Hercules, MD  thiamine 100 MG tablet Take 1 tablet (100 mg total) by mouth daily. 03/21/22  Yes Almon Hercules, MD  amantadine (SYMMETREL) 100 MG capsule Take 1 capsule (100 mg total) by mouth 2 (two) times daily. Patient not taking: Reported on 08/15/2022 03/21/22 08/15/22  Almon Hercules, MD  clopidogrel (PLAVIX) 75 MG tablet Take 1 tablet (75 mg total) by mouth daily. 06/24/22   Hoy Register, MD  Misc. Devices MISC Urinalysis, urine culture and sensitivity 07/29/22   Hoy Register, MD  polyethylene glycol powder (MIRALAX) 17 GM/SCOOP powder Take 17 g by mouth 2 (two) times daily as needed for moderate constipation. Patient not taking: Reported on 06/14/2022 03/21/22   Almon Hercules, MD  propranolol (INDERAL) 10 MG tablet Take 1 tablet (10 mg total) by mouth 3 (three) times daily. Patient not taking: Reported on 08/15/2022 06/24/22   Hoy Register, MD  sulfamethoxazole-trimethoprim (BACTRIM DS) 800-160 MG tablet Take 1 tablet by mouth 2 (two) times daily. Patient not taking: Reported on 08/15/2022 08/05/22  [provider]     Vital Signs: BP (!) 150/98 (BP Location: Right Arm)   Pulse 87   Temp 98.7 F (37.1 C) (Axillary)   Resp (!) 23   Ht 5\' 4"  (1.626 m)   Wt 114 lb 3.2 oz (51.8 kg)   LMP  (LMP Unknown)   SpO2 100%   BMI 19.60 kg/m   Physical Exam Constitutional:      Appearance: She is ill-appearing.  HENT:     Head: Normocephalic and atraumatic.  Cardiovascular:     Rate and Rhythm: Normal rate.  Pulmonary:      Effort: Pulmonary effort is normal. No respiratory distress.  Abdominal:     Tenderness: There is abdominal tenderness.     Comments: G-tube and tract over the epigastric region.  The site is appropriately tender.  A small amount of dry blood is on the skin at the region of the bumper disc and t-tacs.  There is no active bleeding.  Bumper is appropriately cinched to the level of the skin and there is no seepage outside the tract.  G-tube is attached to wall suction and there is approximately 200cc gastric contents in the canister.  Neurological:     Mental Status: She is alert.     Imaging: IR GASTROSTOMY TUBE MOD SED  Result Date: 08/29/2022 INDICATION: Dysphagia. EXAM: PERCUTANEOUS GASTROSTOMY TUBE PLACEMENT COMPARISON:  CT AP, 08/23/2022. MEDICATIONS: Vancomycin 1 gm IV; Antibiotics were administered within 1 hour of the procedure. CONTRAST:  15 mL of Isovue 300 administered into the gastric lumen. ANESTHESIA/SEDATION: Local anesthetic and single agent sedation was employed during this procedure. A total of Versed 1 mg was administered intravenously. The patient's level of consciousness and vital signs were monitored continuously by radiology nursing throughout the procedure under my direct supervision. FLUOROSCOPY TIME:  Fluoroscopic dose; 1 mGy COMPLICATIONS: None immediate. PROCEDURE: Informed written consent was obtained from the patient's family following explanation of the procedure, risks, benefits and alternatives. A time out was performed prior to the initiation of the procedure. Maximal barrier sterile technique utilized including caps, mask, sterile gowns, sterile gloves, large sterile drape, hand hygiene and chlorhexidine prep. The left upper quadrant was sterilely prepped and draped. A oral gastric catheter was inserted into the stomach under fluoroscopy. The existing nasogastric feeding tube was removed. The left costal margin and barium opacified transverse colon were identified and  avoided. Air was injected into the stomach for insufflation and visualization under fluoroscopy. Under sterile conditions and local anesthesia, 2 T tacks were utilized to pexy the anterior aspect of the stomach against the ventral abdominal wall. Contrast injection confirmed appropriate positioning of each of the T tacks. An incision was made between the T tacks and a 17 gauge trocar needle was utilized to access the stomach. Needle position was confirmed within the stomach with aspiration of air and injection of a small amount of contrast. A stiff Glidewire was advanced into the gastric lumen and under intermittent fluoroscopic guidance, the access needle was exchanged for a telescoping peel-away sheath, ultimately allowing placement of a 18-French balloon retention gastrostomy tube. The retention balloon was insufflated with a mixture of dilute saline and contrast and pulled taut against the anterior wall of the stomach. The external disc was cinched. Contrast injection confirms positioning within the stomach. Several spot radiographic images were obtained in various obliquities for documentation. T he patient tolerated procedure well without immediate post procedural complication. FINDINGS: After successful fluoroscopic guided placement, the gastrostomy tube is appropriately  positioned with internal retention balloon against the ventral aspect of the gastric lumen. IMPRESSION: Successful fluoroscopic insertion of percutaneous 16 Fr balloon-retention gastrostomy tube. PLAN: The gastrostomy may be used immediately for medication administration and in 24 hrs for the initiation of feeds. Roanna Banning, MD [facility] Radiology Electronically Signed   By: Roanna Banning M.D.   On: 08/29/2022 20:31    Labs:  CBC: Recent Labs    08/27/22 0845 08/27/22 1920 08/28/22 0817 08/29/22 0448 08/30/22 0613  WBC 6.5  --  7.3 6.7 6.3  HGB 6.3* 9.5* 9.2* 9.7* 9.3*  HCT  18.2* 27.0* 26.3* 28.4* 25.9*  PLT 318  --  360 356 354    COAGS: Recent Labs    01/20/22 1219 03/23/22 1654 04/19/22 1210 08/15/22 0035 08/29/22 0448  INR 1.2 1.2 1.2 1.1 1.1  APTT 29  --  28 28  --     BMP: Recent Labs    08/27/22 0845 08/28/22 0817 08/29/22 0448 08/30/22 0613  NA 140 139 134* 138  K 4.6 4.9 4.2 4.1  CL 101 98 97* 100  CO2 30 31 28 27   GLUCOSE 91 118* 73 97  BUN 7 9 9 6   CALCIUM 8.5* 8.7* 8.8* 9.0  CREATININE 0.68 0.71 0.71 0.82  GFRNONAA >60 >60 >60 >60    LIVER FUNCTION TESTS: Recent Labs    08/23/22 0749 08/24/22 0131 08/25/22 0309 08/27/22 0845  BILITOT 0.2* <0.1* <0.1* 0.4  AST 22 23 26 26   ALT 14 17 22 22   ALKPHOS 92 88 111 98  PROT 5.0* 5.4* 5.4* 5.1*  ALBUMIN 2.1* 2.3* 2.1* 2.0*    Assessment and Plan:  1 day s/p percutaneous gastrostomy  Site check today.  Tube is not leaking and there is no evidence for bleeding.  May begin tube feeds as directed. T-Tacs will need to be removed in 2 weeks if they do not fall off on their own prior.  Electronically Signed: 13/12/23, PA 08/30/2022, 12:29 PM   I spent a total of 15 Minutes at the the patient's bedside AND on the patient's hospital floor or unit, greater than 50% of which was counseling/coordinating care for g-tube

## 2022-08-30 NOTE — Progress Notes (Signed)
Nutrition Follow-up  DOCUMENTATION CODES:   Non-severe (moderate) malnutrition in context of chronic illness  INTERVENTION:   Tube feed via Cortrak: Start Osmolite 1.2 at 30 mL/hr and advance by 10 mL q4h to goal rate of 60 mL/hr (1440 mL per day) 150 mL free water flush q6h Provides 1728 kcal, 80 gm protein, and 1781 mL total free water daily.  Continue Multivitamin w/ minerals, Thiamine, and folic acid daily RD to adjust to bolus as able Goal: 1 carton Osmolite 1.5 - five times per day Start with 1/2 carton first bolus and increase to full carton at second bolus Administer slowly over 15-20 minutes  30 mL free water flush before and after each bolus 125 mL free water flush - four times per day  NUTRITION DIAGNOSIS:   Moderate Malnutrition related to chronic illness, social / environmental circumstances (bedbound and nonverbal after TIA) as evidenced by mild muscle depletion, mild fat depletion. 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 Patient presented to the ED due to cough and worsening mental status. CXR showed unchanged cardiomegaly with small pleural effusion. UA was concerning for UTI. She was started on IV fluids and antibiotics. Palliative Care was consulted.  11/08 - NGT pulled 11/16 - PEG placed   Pt laying in bed, reports she is ok. Explained restarting tube feeds to pt. RN in room, reports pt is doing better.  Discussed tube feeds with CM, MD, and RN.   If pt is able to tolerate feeds, can trial bolus feeds: 1 carton Osmolite 1.5 - five times per day  Medications  reviewed and include: Folic Acid, MVI, Thiamine Labs reviewed: 24 hr CBGs 51-115  Diet Order:   Diet Order             DIET - DYS 1 Room service appropriate? No; Fluid consistency: Thin  Diet effective now                   EDUCATION NEEDS:   Not appropriate for education at this time  Skin:  Skin Assessment: Skin Integrity Issues: Skin Integrity Issues:: Stage II Stage II: R buttocks  Last BM:  11/17  Height:  Ht Readings from Last 1 Encounters:  08/15/22 5\' 4"  (1.626 m)   Weight:  Wt Readings from Last 1 Encounters:  08/30/22 51.8 kg   Ideal Body Weight:  54.54 kg  BMI:  Body mass index is 19.6 kg/m.  Estimated Nutritional Needs:  Kcal:  1650-1850 kcal Protein:  80-95 grams Fluid:  >/= 1.8 L/day    09/01/22 RD, LDN Clinical Dietitian See [facility] for contact information.

## 2022-08-31 DIAGNOSIS — J159 Unspecified bacterial pneumonia: Secondary | ICD-10-CM | POA: Diagnosis not present

## 2022-08-31 DIAGNOSIS — R1310 Dysphagia, unspecified: Secondary | ICD-10-CM | POA: Diagnosis not present

## 2022-08-31 DIAGNOSIS — N3000 Acute cystitis without hematuria: Secondary | ICD-10-CM | POA: Diagnosis not present

## 2022-08-31 LAB — GLUCOSE, CAPILLARY
Glucose-Capillary: 119 mg/dL — ABNORMAL HIGH (ref 70–99)
Glucose-Capillary: 130 mg/dL — ABNORMAL HIGH (ref 70–99)
Glucose-Capillary: 128 mg/dL — ABNORMAL HIGH (ref 70–99)
Glucose-Capillary: 144 mg/dL — ABNORMAL HIGH (ref 70–99)
Glucose-Capillary: 106 mg/dL — ABNORMAL HIGH (ref 70–99)
Glucose-Capillary: 94 mg/dL (ref 70–99)
Glucose-Capillary: 113 mg/dL — ABNORMAL HIGH (ref 70–99)
Glucose-Capillary: 101 mg/dL — ABNORMAL HIGH (ref 70–99)

## 2022-08-31 MED ORDER — METOPROLOL TARTRATE 50 MG PO TABS
50.0000 mg | ORAL_TABLET | Freq: Two times a day (BID) | ORAL | Status: DC
  Administered 2022-08-31 – 2022-09-02 (×5): 50 mg
  Filled 2022-08-31 (×5): qty 1

## 2022-08-31 NOTE — Progress Notes (Signed)
PROGRESS NOTE   Cindy Brooks  RFX:[id number] DOB: 11-09-1970 DOA: 08/14/2022 PCP: Hoy Register, MD   Brief Narrative:   51 yr blk female largely bedbound since April Antiphospholipid AB with hypercoag state Chr dysphagia htn Bed-bound form prior CVA 01/22/22 Multiple subsequent admits 03/2022 for AMS, 04/2022 for UTI  Came to Ed with altered mental status on 08/15/22 Tachy 140, lactic 2.3, WBC 7, Sod 149 Admitted with likely aspiration pneumonia and severe sepsis and placed on broad-spectrum antibiotics initially  11/3 palliative care consulted 11/4 EEG shows encephalopathy with encephalopathy 11/6 pulmonary consulted for left lower lobe partial postobstructive collapse and mediastinal shift they recommended placing patient right lung down to facilitate pulmonary drainage--felt like she was going to be at high risk for aspiration regardless 11/7 neurology consulted secondary to stroke found on MRI 11/10 core track placed feeds resumed as NG abdomen postoperative times 11/16 PEG inserted by IR  Hospital-Problem based course  Severe sepsis on admission secondary to aspiration pneumonia - CT on 11/2 showed mucoid debris in left-sided airways left overall lower and left upper lobe - Completed 7-day course of antibiotics 11/7  UTI from prior to admission likely secondary to indwelling Foley - Patient completed antibiotics  Chronic dysphagia -Initially with tube feed via core track, PEG tube to be placed 11/16. -He has been followed closely by SLP as well, she is currently on dysphagia 1 diet with thin liquids -Tube feed currently at goal, will DC IV fluids today.  Hypoglycemia -Now her tube feeds at goal will DC D5 half NS and monitor CBG every 4 hours  Acute metabolic encephalopathy, underlying prior CVA Possible encephalopathy from multiple meds - Gabapentin and Cymbalta other meds all held-mentation improved over the next several days - Neurology consulted and recommending  Brilinta for 4 weeks then aspirin alone no further work-up--both have been held for placement of PEG. - Metabolic work-up ammonia TSH B12 all normal - Patient is more interactive verbalizing only a little -Now back on Brilinta  Hyponatremia acute metabolic acidosis hypokalemia hypomagnesemia - Metabolic derangements have resolved - Periodic labs  Anemia of chronic disease with iatrogenic blood loss - Elevated ferritin level. - 1 Unit PRBC 11/14, with good response, hemoglobin been stable. -no evidence of GI bleed   Stage II right buttock pressure ulcer present from admission - Continue local wound care   DVT prophylaxis: Lovenox  Code Status: Full Family Communication: None at bedside, D/W daughter by phone 11/17. Disposition:  Status is: Inpatient Remains inpatient appropriate because:   Remains critically ill, but has improved slightly with mentation   Consultants:  Neurology Pulmonology  Procedures: Multiple  Antimicrobials: Completed   Subjective:  No significant events overnight as discussed with staff, she is tolerating her tube feeds.  Objective: Vitals:   08/31/22 0400 08/31/22 0500 08/31/22 0800 08/31/22 1132  BP: (!) 145/94  120/89 (!) 128/90  Pulse: 86  89 87  Resp: 18  20 18   Temp: 98.4 F (36.9 C)  98.4 F (36.9 C) 98.1 F (36.7 C)  TempSrc: Axillary  Axillary Axillary  SpO2: 100%  100% 100%  Weight:  52.1 kg    Height:        Intake/Output Summary (Last 24 hours) at 08/31/2022 1352 Last data filed at 08/31/2022 1107 Gross per 24 hour  Intake 3373.71 ml  Output 800 ml  Net 2573.71 ml   Filed Weights   08/29/22 0500 08/30/22 0422 08/31/22 0500  Weight: 53.4 kg 51.8 kg 52.1 kg  Examination:  Sleeping comfortably today, no apparent distress Symmetrical Chest wall movement, Good air movement bilaterally, CTAB RRR,No Gallops,Rubs or new Murmurs, No Parasternal Heave +ve B.Sounds, Abd Soft,PEG+ No Cyanosis, Clubbing or edema, No new  Rash or bruise     Data Reviewed: personally reviewed   CBC    Component Value Date/Time   WBC 6.3 08/30/2022 0613   RBC 3.17 (L) 08/30/2022 0613   HGB 9.3 (L) 08/30/2022 0613   HGB 11.1 (L) 06/14/2022 1040   HCT 25.9 (L) 08/30/2022 0613   PLT 354 08/30/2022 0613   PLT 244 06/14/2022 1040   MCV 81.7 08/30/2022 0613   MCH 29.3 08/30/2022 0613   MCHC 35.9 08/30/2022 0613   RDW 16.4 (H) 08/30/2022 0613   LYMPHSABS 2.2 08/28/2022 0817   MONOABS 0.8 08/28/2022 0817   EOSABS 0.1 08/28/2022 0817   BASOSABS 0.0 08/28/2022 0817      Latest Ref Rng & Units 08/30/2022    6:13 AM 08/29/2022    4:48 AM 08/28/2022    8:17 AM  CMP  Glucose 70 - 99 mg/dL 97  73  932   BUN 6 - 20 mg/dL 6  9  9    Creatinine 0.44 - 1.00 mg/dL  6.71  2.45   Sodium 135 - 145 mmol/L 138  134  139   Potassium 3.5 - 5.1 mmol/L 4.1  4.2  4.9   Chloride 98 - 111 mmol/L 100  97  98   CO2 22 - 32 mmol/L 27  28  31    Calcium 8.9 - 10.3 mg/dL 9.0  8.8  8.7      Radiology Studies: DG Swallowing Func-Speech Pathology  Result Date: 08/30/2022 Table formatting from the original result was not included. Objective Swallowing Evaluation: Type of Study: MBS-Modified Barium Swallow Study  Patient Details Name: Cindy Brooks MRN: [id number] Date of Birth: January 05, 1971 Today's Date: 08/30/2022 Time: SLP Start Time (ACUTE ONLY): 1321 -SLP Stop Time (ACUTE ONLY): 1340 SLP Time Calculation (min) (ACUTE ONLY): 19 min Past Medical History: Past Medical History: Diagnosis Date  Hypertension   Hypertensive urgency 01/20/2022  Seizure (HCC) 01/28/2022  Stroke (HCC)   TIA (transient ischemic attack) 2019  Urinary tract infection 01/28/2022 Past Surgical History: Past Surgical History: Procedure Laterality Date  ESOPHAGOGASTRODUODENOSCOPY N/A 01/28/2022  Procedure: ESOPHAGOGASTRODUODENOSCOPY (EGD);  Surgeon: 01/30/2022, MD;  Location: [facility] ENDOSCOPY;  Service: General;  Laterality: N/A;  IR GASTROSTOMY TUBE MOD SED  08/29/2022  PEG  PLACEMENT N/A 01/28/2022  Procedure: PERCUTANEOUS ENDOSCOPIC GASTROSTOMY (PEG) PLACEMENT;  Surgeon: 08/31/2022, MD;  Location: [facility] ENDOSCOPY;  Service: General;  Laterality: N/A; HPI: Cindy Brooks is a 51 y.o. female with medical history significant of recurrent UTI who presents emergency department due to cough and altered mental status.  Patient is nonverbal at baseline and bedbound due to prior stroke.  She had a two-month hospitalization in April of 2023 due to bilateral thalamic, midbrain, and bilateral cerebral infarctions. She had trach/PEG at that time and dysphagia and cognitive/language therapies. Chest CT reported "Mucoid debris in the left-sided airways with near-complete postobstructive collapse of the left lower lobe and partial postobstructive collapse of the left upper lobe, with significant associated volume loss and leftward mediastinal shift."  Most recent BSE on 03/25/22 with recommendations for Dysphagia 2 solids and thin liquids.  Subjective: pt alert but minimally verbal  Recommendations for follow up therapy are one component of a multi-disciplinary discharge planning process, led by the attending physician.  Recommendations may  be updated based on patient status, additional functional criteria and insurance authorization. Assessment / Plan / Recommendation   08/30/2022   1:00 PM Clinical Impressions Clinical Impression Pt presents with an oral more than pharyngeal dysphagia. She has does not open her lips and mouth far enough to accept a spoon, although the tip of a spoon can be placed in her mouth for pt to more passively receive purees. As a result she gets fairly small boluses at a time despite cues and assistance provided to try to increase bolus size. Posterior lingual propulsion is slow to initiate especially with purees, with SLP providing repeated cues per bolus to swallow. She could not clear the barium tablet from her oral cavity nor could she masticate it, so she had to  expectorate it. Pharyngeally she has some reduced anterior hyolaryngeal movement but her efficiency and safety appear to be intact, in that there is no overt residue and no aspiration. Recommend that pt begin Dys 1 (puree) diet and thin liquids. Would also note that risk for aspiration on this diet could potentially fluctuate in the setting of variable alertness/mentation as well as suboptimal positioning. PO intake especially of solids (purees) may be effortful and time consuming so use of PEG will likely still be beneficial. SLP Visit Diagnosis Dysphagia, oropharyngeal phase (R13.12) Impact on safety and function Mild aspiration risk;Moderate aspiration risk;Risk for inadequate nutrition/hydration     08/30/2022   1:00 PM Treatment Recommendations Treatment Recommendations Therapy as outlined in treatment plan below     08/30/2022   1:00 PM Prognosis Prognosis for Safe Diet Advancement Fair Barriers to Reach Goals Cognitive deficits;Language deficits   08/30/2022   1:00 PM Diet Recommendations SLP Diet Recommendations Dysphagia 1 (Puree) solids;Thin liquid Liquid Administration via Straw;Cup Medication Administration Crushed with puree Compensations Minimize environmental distractions;Slow rate;Small sips/bites;Other (Comment) Postural Changes Seated upright at 90 degrees     08/30/2022   1:00 PM Other Recommendations Oral Care Recommendations Oral care BID Other Recommendations Have oral suction available Follow Up Recommendations Home health SLP Functional Status Assessment Patient has had a recent decline in their functional status and demonstrates the ability to make significant improvements in function in a reasonable and predictable amount of time.   08/30/2022   1:00 PM Frequency and Duration  Speech Therapy Frequency (ACUTE ONLY) min 2x/week Treatment Duration 2 weeks     08/30/2022   1:00 PM Oral Phase Oral Phase Impaired Oral - Thin Cup Holding of bolus;Reduced posterior propulsion;Lingual/palatal  residue Oral - Thin Straw Holding of bolus;Reduced posterior propulsion;Lingual/palatal residue Oral - Puree Weak lingual manipulation;Reduced posterior propulsion;Delayed oral transit;Other (Comment) Oral - Pill Reduced posterior propulsion;Lingual/palatal residue;Other (Comment)    08/30/2022   1:00 PM Pharyngeal Phase Pharyngeal Phase Impaired Pharyngeal- Thin Cup Reduced anterior laryngeal mobility Pharyngeal- Thin Straw Reduced anterior laryngeal mobility Pharyngeal- Puree [facility]    08/30/2022   1:00 PM Cervical Esophageal Phase  Cervical Esophageal Phase -- Mahala Menghini., M.A. CCC-SLP Acute Rehabilitation Services Office [phone] Secure chat preferred 08/30/2022, 2:15 PM                       Scheduled Meds:  amantadine  100 mg Per Tube BID   atorvastatin  80 mg Per Tube Daily   capsaicin   Topical BID   Chlorhexidine Gluconate Cloth  6 each Topical Daily   enoxaparin (LOVENOX) injection  40 mg Subcutaneous Q24H   folic acid  1 mg Per Tube Daily   free water  150 mL Per Tube Q6H   guaiFENesin  10 mL Per Tube BID   metoprolol tartrate  50 mg Per Tube BID   multivitamin with minerals  1 tablet Per Tube Daily   mouth rinse  15 mL Mouth Rinse 4 times per day   scopolamine  1 patch Transdermal Q72H   thiamine  100 mg Per Tube Daily   ticagrelor  90 mg Per Tube BID   Continuous Infusions:  feeding supplement (OSMOLITE 1.2 CAL) 60 mL/hr at 08/31/22 1107     LOS: 16 days     Huey Bienenstockawood Ananya Mccleese, MD [facility] To contact the attending provider between 7A-7P or the covering provider during after hours 7P-7A, please log into the web site [email or web address] and access using universal [place] password for that web site. If you do not have the password, please call the hospital operator.  08/31/2022, 1:52 PM

## 2022-08-31 NOTE — Progress Notes (Signed)
   Chart reviewed including progress notes, labs, imaging. Patient is s/p PEG placement on 11/16 and remains stable.  No further acute palliative needs identified at this time. Patient would benefit from outpatient palliative care referral if family is agreeable.   PMT will sign off at this time. Please re-consult should urgent needs arise.   Richardson Dopp, [facility] Palliative Medicine Team  Team Phone # [phone]   NO CHARGE

## 2022-09-01 DIAGNOSIS — J159 Unspecified bacterial pneumonia: Secondary | ICD-10-CM | POA: Diagnosis not present

## 2022-09-01 DIAGNOSIS — N3000 Acute cystitis without hematuria: Secondary | ICD-10-CM | POA: Diagnosis not present

## 2022-09-01 DIAGNOSIS — R1310 Dysphagia, unspecified: Secondary | ICD-10-CM | POA: Diagnosis not present

## 2022-09-01 LAB — BASIC METABOLIC PANEL
Anion gap: 12 (ref 5–15)
BUN: 5 mg/dL — ABNORMAL LOW (ref 6–20)
CO2: 25 mmol/L (ref 22–32)
Calcium: 9.2 mg/dL (ref 8.9–10.3)
Chloride: 100 mmol/L (ref 98–111)
Creatinine, Ser: 0.83 mg/dL (ref 0.44–1.00)
GFR, Estimated: >60 mL/min (ref >60)
Glucose, Bld: 105 mg/dL — ABNORMAL HIGH (ref 70–99)
Potassium: 4.3 mmol/L (ref 3.5–5.1)
Sodium: 137 mmol/L (ref 135–145)

## 2022-09-01 LAB — GLUCOSE, CAPILLARY
Glucose-Capillary: 100 mg/dL — ABNORMAL HIGH (ref 70–99)
Glucose-Capillary: 102 mg/dL — ABNORMAL HIGH (ref 70–99)
Glucose-Capillary: 103 mg/dL — ABNORMAL HIGH (ref 70–99)
Glucose-Capillary: 108 mg/dL — ABNORMAL HIGH (ref 70–99)
Glucose-Capillary: 123 mg/dL — ABNORMAL HIGH (ref 70–99)
Glucose-Capillary: 130 mg/dL — ABNORMAL HIGH (ref 70–99)
Glucose-Capillary: 95 mg/dL (ref 70–99)

## 2022-09-01 LAB — CBC
HCT: 26 % — ABNORMAL LOW (ref 36.0–46.0)
Hemoglobin: 9 g/dL — ABNORMAL LOW (ref 12.0–15.0)
MCH: 28.8 pg (ref 26.0–34.0)
MCHC: 34.6 g/dL (ref 30.0–36.0)
MCV: 83.3 fL (ref 80.0–100.0)
Platelets: 346 10*3/uL (ref 150–400)
RBC: 3.12 MIL/uL — ABNORMAL LOW (ref 3.87–5.11)
RDW: 16.4 % — ABNORMAL HIGH (ref 11.5–15.5)
WBC: 6.6 10*3/uL (ref 4.0–10.5)
nRBC: 0 % (ref 0.0–0.2)

## 2022-09-01 NOTE — Progress Notes (Signed)
PROGRESS NOTE   Cindy Brooks  [id number]:[id number] DOB: 09/26/1971 DOA: 08/14/2022 PCP: Hoy Register, MD   Brief Narrative:   89 yr blk female largely bedbound since April Antiphospholipid AB with hypercoag state Chr dysphagia htn Bed-bound form prior CVA 01/22/22 Multiple subsequent admits 03/2022 for AMS, 04/2022 for UTI  Came to Ed with altered mental status on 08/15/22 Tachy 140, lactic 2.3, WBC 7, Sod 149 Admitted with likely aspiration pneumonia and severe sepsis and placed on broad-spectrum antibiotics initially  11/3 palliative care consulted 11/4 EEG shows encephalopathy with encephalopathy 11/6 pulmonary consulted for left lower lobe partial postobstructive collapse and mediastinal shift they recommended placing patient right lung down to facilitate pulmonary drainage--felt like she was going to be at high risk for aspiration regardless 11/7 neurology consulted secondary to stroke found on MRI 11/10 core track placed feeds resumed as NG abdomen postoperative times 11/16 PEG inserted by IR  Hospital-Problem based course  Severe sepsis on admission secondary to aspiration pneumonia - CT on 11/2 showed mucoid debris in left-sided airways left overall lower and left upper lobe - Completed 7-day course of antibiotics 11/7  UTI from prior to admission likely secondary to indwelling Foley - Patient completed antibiotics  Chronic dysphagia -Initially with tube feed via core track, PEG tube to be placed 11/16. -He has been followed closely by SLP as well, she is currently on dysphagia 1 diet with thin liquids -Tube feed currently at goal, will DC IV fluids today.  Hypoglycemia -Now her tube feeds at goal will DC D5 half NS and monitor CBG every 4 hours  Acute metabolic encephalopathy, underlying prior CVA Possible encephalopathy from multiple meds - Gabapentin and Cymbalta other meds all held-mentation improved over the next several days - Neurology consulted and recommending  Brilinta for 4 weeks then aspirin alone no further work-up--both have been held for placement of PEG. - Metabolic work-up ammonia TSH B12 all normal - Patient is more interactive verbalizing only a little -Now back on Brilinta  Hyponatremia acute metabolic acidosis hypokalemia hypomagnesemia - Metabolic derangements have resolved - Periodic labs  Anemia of chronic disease with iatrogenic blood loss - Elevated ferritin level. - 1 Unit PRBC 11/14, with good response, hemoglobin been stable. -no evidence of GI bleed   Stage II right buttock pressure ulcer present from admission - Continue local wound care   DVT prophylaxis: Lovenox  Code Status: Full Family Communication: None at bedside, D/W daughter by phone 11/17. Disposition: Home with home health tomorrow Status is: Inpatient      Consultants:  Neurology Pulmonology  Procedures: Multiple  Antimicrobials: Completed   Subjective:  Significant events overnight, she is tolerating her tube feed  Objective: Vitals:   08/31/22 2351 09/01/22 0400 09/01/22 0800 09/01/22 1200  BP: 130/89 (!) 142/85 (!) 134/106 126/88  Pulse: 85 84 85   Resp: 16 (!) 21 20 19   Temp: 98.6 F (37 C) 99.6 F (37.6 C) 99.1 F (37.3 C) 99.1 F (37.3 C)  TempSrc: Axillary Axillary Axillary Axillary  SpO2: 100% 100% 100% 100%  Weight:      Height:        Intake/Output Summary (Last 24 hours) at 09/01/2022 1406 Last data filed at 09/01/2022 0539 Gross per 24 hour  Intake 2112 ml  Output 1350 ml  Net 762 ml   Filed Weights   08/29/22 0500 08/30/22 0422 08/31/22 0500  Weight: 53.4 kg 51.8 kg 52.1 kg    Examination:  Pain comfortably, eye closed, no apparent distress Symmetrical  Chest wall movement, Good air movement bilaterally, CTAB RRR,No Gallops,Rubs or new Murmurs, No Parasternal Heave +ve B.Sounds, Abd Soft, PEG present and No Cyanosis, Clubbing or edema, No new Rash or bruise    Data Reviewed: personally reviewed    CBC    Component Value Date/Time   WBC 6.6 09/01/2022 0403   RBC 3.12 (L) 09/01/2022 0403   HGB 9.0 (L) 09/01/2022 0403   HGB 11.1 (L) 06/14/2022 1040   HCT 26.0 (L) 09/01/2022 0403   PLT 346 09/01/2022 0403   PLT 244 06/14/2022 1040   MCV 83.3 09/01/2022 0403   MCH 28.8 09/01/2022 0403   MCHC 34.6 09/01/2022 0403   RDW 16.4 (H) 09/01/2022 0403   LYMPHSABS 2.2 08/28/2022 0817   MONOABS 0.8 08/28/2022 0817   EOSABS 0.1 08/28/2022 0817   BASOSABS 0.0 08/28/2022 0817      Latest Ref Rng & Units 09/01/2022    4:03 AM 08/30/2022    6:13 AM 08/29/2022    4:48 AM  CMP  Glucose 70 - 99 mg/dL 944  97  73   BUN 6 - 20 mg/dL 5  6  9    Creatinine 0.44 - 1.00 mg/dL  9.67  5.91   Sodium 135 - 145 mmol/L 137  138  134   Potassium 3.5 - 5.1 mmol/L 4.3  4.1  4.2   Chloride 98 - 111 mmol/L 100  100  97   CO2 22 - 32 mmol/L 25  27  28    Calcium 8.9 - 10.3 mg/dL 9.2  9.0  8.8      Radiology Studies: No results found.   Scheduled Meds:  amantadine  100 mg Per Tube BID   atorvastatin  80 mg Per Tube Daily   capsaicin   Topical BID   Chlorhexidine Gluconate Cloth  6 each Topical Daily   enoxaparin (LOVENOX) injection  40 mg Subcutaneous Q24H   folic acid  1 mg Per Tube Daily   free water  150 mL Per Tube Q6H   guaiFENesin  10 mL Per Tube BID   metoprolol tartrate  50 mg Per Tube BID   multivitamin with minerals  1 tablet Per Tube Daily   mouth rinse  15 mL Mouth Rinse 4 times per day   scopolamine  1 patch Transdermal Q72H   thiamine  100 mg Per Tube Daily   ticagrelor  90 mg Per Tube BID   Continuous Infusions:  feeding supplement (OSMOLITE 1.2 CAL) 1,000 mL (09/01/22 0939)     LOS: 17 days     , MD [facility] To contact the attending provider between 7A-7P or the covering provider during after hours 7P-7A, please log into the web site [email or web address] and access using universal [place] password for that web site. If you do not have the  password, please call the hospital operator.  09/01/2022, 2:06 PM

## 2022-09-02 ENCOUNTER — Other Ambulatory Visit (HOSPITAL_COMMUNITY): Payer: Self-pay

## 2022-09-02 ENCOUNTER — Encounter (HOSPITAL_COMMUNITY): Payer: Self-pay | Admitting: Internal Medicine

## 2022-09-02 LAB — GLUCOSE, CAPILLARY
Glucose-Capillary: 92 mg/dL (ref 70–99)
Glucose-Capillary: 116 mg/dL — ABNORMAL HIGH (ref 70–99)
Glucose-Capillary: 130 mg/dL — ABNORMAL HIGH (ref 70–99)
Glucose-Capillary: 72 mg/dL (ref 70–99)

## 2022-09-02 MED ORDER — TICAGRELOR 90 MG PO TABS
90.0000 mg | ORAL_TABLET | Freq: Two times a day (BID) | ORAL | 0 refills | Status: AC
  Filled 2022-09-02: qty 50, 25d supply, fill #0

## 2022-09-02 MED ORDER — FREE WATER: 150.0000 mL | Freq: Four times a day (QID) | Status: DC

## 2022-09-02 MED ORDER — FREE WATER
125.0000 mL | Freq: Four times a day (QID) | Status: DC
  Administered 2022-09-02: 125 mL

## 2022-09-02 MED ORDER — AMANTADINE HCL 100 MG PO CAPS: 100.0000 mg | ORAL_CAPSULE | Freq: Two times a day (BID) | ORAL | 0 refills | Status: DC

## 2022-09-02 MED ORDER — METOPROLOL TARTRATE 50 MG PO TABS
50.0000 mg | ORAL_TABLET | Freq: Two times a day (BID) | ORAL | 0 refills | Status: DC
  Filled 2022-09-02: qty 60, 30d supply, fill #0

## 2022-09-02 MED ORDER — OSMOLITE 1.5 CAL PO LIQD
237.0000 mL | Freq: Every day | ORAL | Status: DC
  Administered 2022-09-02 (×2): 237 mL
  Filled 2022-09-02 (×5): qty 237

## 2022-09-02 NOTE — Discharge Summary (Signed)
Physician Discharge Summary  Cindy Brooks ZOX:[id number] DOB: Oct 03, 1971 DOA: 08/14/2022  PCP: Hoy Register, MD  Admit date: 08/14/2022 Discharge date: 09/02/2022  Admitted From: (Home) Disposition:  (Home)  Recommendations for Outpatient Follow-up:  Follow up with PCP in 1-2 weeks Please obtain BMP/CBC in one week Nutrition /tube feed, meds mainly via PEG, she is on dysphagia 1 with thin liquids whenever awake enough and able to tolerate.  Home Health: (YES)   Discharge Condition: (Stable) CODE STATUS: (FULL) Diet recommendation: Nutrition /tube feed, meds mainly via PEG, she is on dysphagia 1 with thin liquids whenever awake enough and able to tolerate.  Brief/Interim Summary:  51 yr blk female largely bedbound since April Antiphospholipid AB with hypercoag state Chr dysphagia htn Bed-bound form prior CVA 01/22/22 Multiple subsequent admits 03/2022 for AMS, 04/2022 for UTI   Came to Ed with altered mental status on 08/15/22 Tachy 140, lactic 2.3, WBC 7, Sod 149 Admitted with likely aspiration pneumonia and severe sepsis and placed on broad-spectrum antibiotics initially  11/3 palliative care consulted 11/4 EEG shows encephalopathy with encephalopathy 11/6 pulmonary consulted for left lower lobe partial postobstructive collapse and mediastinal shift they recommended placing patient right lung down to facilitate pulmonary drainage--felt like she was going to be at high risk for aspiration regardless 11/7 neurology consulted secondary to stroke found on MRI 11/10 core track placed feeds resumed as NG abdomen postoperative times 11/16 PEG inserted by IR She is tolerating her tube feeds at time of discharge, and is is being transitioned to bolus tube feed.  Severe sepsis on admission secondary to aspiration pneumonia - CT on 11/2 showed mucoid debris in left-sided airways left overall lower and left upper lobe - Completed 7-day course of antibiotics 11/7   UTI from prior to  admission likely secondary to indwelling Foley - Patient completed antibiotics   Chronic dysphagia -Initially with tube feed via core track, PEG tube to be placed 11/16. -He has been followed closely by SLP as well, she is currently on dysphagia 1 diet with thin liquids -Tube feed currently at goal   Hypoglycemia -Resolved, now she is on tube feed.   Acute metabolic encephalopathy, underlying prior CVA Possible encephalopathy from multiple meds - Gabapentin and Cymbalta other meds all held-mentation improved over the next several days - Neurology consulted and recommending Brilinta for 4 weeks then aspirin alone no further work-up--both have been held for placement of PEG.  Now PEG has been placed she is resumed on Brilinta to finish total of 4 weeks (another 25 days as an outpatient), and to continue with aspirin only after diet. - Metabolic work-up ammonia TSH B12 all normal - Patient is more interactive verbalizing only a little    Hyponatremia acute metabolic acidosis hypokalemia hypomagnesemia - Metabolic derangements have resolved - Periodic labs   Anemia of chronic disease with iatrogenic blood loss - Elevated ferritin level. - 1 Unit PRBC 11/14, with good response, hemoglobin been stable. -no evidence of GI bleed    Stage II right buttock pressure ulcer present from admission - Continue local wound care  Discharge Diagnoses:  Principal Problem:   Community acquired bacterial pneumonia Active Problems:   Acute metabolic encephalopathy   UTI (urinary tract infection)   CVA (cerebral vascular accident) (HCC)   Pressure ulcer   Sepsis (HCC)   Hypomagnesemia   Mucus plug in respiratory tract   Dysphagia   Malnutrition of moderate degree    Discharge Instructions  Discharge Instructions     Diet -  low sodium heart healthy   Complete by: As directed    Increase activity slowly   Complete by: As directed    No wound care   Complete by: As directed        Allergies as of 09/02/2022       Reactions   Aspirin Hives, Rash   Codeine Hives, Rash   Penicillins Anaphylaxis, Swelling, Other (See Comments)   Strawberry Extract Anaphylaxis, Hives        Medication List     STOP taking these medications    clopidogrel 75 MG tablet Commonly known as: PLAVIX   DULoxetine 60 MG capsule Commonly known as: Cymbalta   hydrALAZINE 50 MG tablet Commonly known as: APRESOLINE   polyethylene glycol powder 17 GM/SCOOP powder Commonly known as: MiraLax   propranolol 10 MG tablet Commonly known as: INDERAL   propranolol 20 MG tablet Commonly known as: INDERAL   sulfamethoxazole-trimethoprim 800-160 MG tablet Commonly known as: BACTRIM DS       TAKE these medications    acetaminophen 325 MG tablet Commonly known as: Tylenol Take 2 tablets (650 mg total) by mouth every 4 (four) hours as needed. What changed: reasons to take this   amantadine 100 MG capsule Commonly known as: SYMMETREL Take 1 capsule (100 mg total) by mouth 2 (two) times daily.   atorvastatin 80 MG tablet Commonly known as: LIPITOR Take 1 tablet (80 mg total) by mouth daily.   chlorhexidine 0.12 % solution Commonly known as: PERIDEX Use as directed 15 mLs in the mouth or throat 2 (two) times daily.   Ensure Plus Liqd Take 237 mLs by mouth 3 (three) times daily between meals.   folic acid 1 MG tablet Commonly known as: FOLVITE Take 1 tablet (1 mg total) by mouth daily.   free water Soln Place 150 mLs into feeding tube every 6 (six) hours.   gabapentin 100 MG capsule Commonly known as: NEURONTIN Take 1 capsule (100 mg total) by mouth 2 (two) times daily.   Gerhardt's butt cream Crea Apply 1 application. topically daily as needed for irritation.   ipratropium-albuterol 0.5-2.5 (3) MG/3ML Soln Commonly known as: DUONEB Take 3 mLs by nebulization every 6 (six) hours as needed. What changed: reasons to take this   metoprolol tartrate 50 MG  tablet Commonly known as: LOPRESSOR Place 1 tablet (50 mg total) into feeding tube 2 (two) times daily.   Misc. Devices Misc Urinalysis, urine culture and sensitivity   multivitamin with minerals Tabs tablet Take 1 tablet by mouth daily.   nystatin 100000 UNIT/ML suspension Commonly known as: MYCOSTATIN Take 5 mLs (500,000 Units total) by mouth 4 (four) times daily.   senna-docusate 8.6-50 MG tablet Commonly known as: Senokot-S Take 1 tablet by mouth 2 (two) times daily between meals as needed for mild constipation.   thiamine 100 MG tablet Commonly known as: VITAMIN B1 Take 1 tablet (100 mg total) by mouth daily.   ticagrelor 90 MG Tabs tablet Commonly known as: BRILINTA Place 1 tablet (90 mg total) into feeding tube 2 (two) times daily for 25 days.               Durable Medical Equipment  (From admission, onward)           Start     Ordered   09/02/22 0924  For home use only DME Tube feeding  Once       Comments: Osmolite 1.5  1 carton five times a day Anticipate Long  term need.   09/02/22 0924            Allergies  Allergen Reactions   Aspirin Hives and Rash   Codeine Hives and Rash   Penicillins Anaphylaxis, Swelling and Other (See Comments)   Strawberry Extract Anaphylaxis and Hives    Consultations: Neurology Pulmonology     Procedures/Studies: DG Swallowing Func-Speech Pathology  Result Date: 08/30/2022 Table formatting from the original result was not included. Objective Swallowing Evaluation: Type of Study: MBS-Modified Barium Swallow Study  Patient Details Name: Cindy Brooks MRN: [id number] Date of Birth: 03/21/1971 Today's Date: 08/30/2022 Time: SLP Start Time (ACUTE ONLY): 1321 -SLP Stop Time (ACUTE ONLY): 1340 SLP Time Calculation (min) (ACUTE ONLY): 19 min Past Medical History: Past Medical History: Diagnosis Date  Hypertension   Hypertensive urgency 01/20/2022  Seizure (HCC) 01/28/2022  Stroke (HCC)   TIA (transient ischemic  attack) 2019  Urinary tract infection 01/28/2022 Past Surgical History: Past Surgical History: Procedure Laterality Date  ESOPHAGOGASTRODUODENOSCOPY N/A 01/28/2022  Procedure: ESOPHAGOGASTRODUODENOSCOPY (EGD);  Surgeon: Diamantina Monks, MD;  Location[facility] ENDOSCOPY;  Service: General;  Laterality: N/A;  IR GASTROSTOMY TUBE MOD SED  08/29/2022  PEG PLACEMENT N/A 01/28/2022  Procedure: PERCUTANEOUS ENDOSCOPIC GASTROSTOMY (PEG) PLACEMENT;  Surgeon: Diamantina Monks, MD;  Location: [facility] ENDOSCOPY;  Service: General;  Laterality: N/A; HPI: Cindy Brooks is a 51 y.o. female with medical history significant of recurrent UTI who presents emergency department due to cough and altered mental status.  Patient is nonverbal at baseline and bedbound due to prior stroke.  She had a two-month hospitalization in April of 2023 due to bilateral thalamic, midbrain, and bilateral cerebral infarctions. She had trach/PEG at that time and dysphagia and cognitive/language therapies. Chest CT reported "Mucoid debris in the left-sided airways with near-complete postobstructive collapse of the left lower lobe and partial postobstructive collapse of the left upper lobe, with significant associated volume loss and leftward mediastinal shift."  Most recent BSE on 03/25/22 with recommendations for Dysphagia 2 solids and thin liquids.  Subjective: pt alert but minimally verbal  Recommendations for follow up therapy are one component of a multi-disciplinary discharge planning process, led by the attending physician.  Recommendations may be updated based on patient status, additional functional criteria and insurance authorization. Assessment / Plan / Recommendation   08/30/2022   1:00 PM Clinical Impressions Clinical Impression Pt presents with an oral more than pharyngeal dysphagia. She has does not open her lips and mouth far enough to accept a spoon, although the tip of a spoon can be placed in her mouth for pt to more passively receive purees. As a  result she gets fairly small boluses at a time despite cues and assistance provided to try to increase bolus size. Posterior lingual propulsion is slow to initiate especially with purees, with SLP providing repeated cues per bolus to swallow. She could not clear the barium tablet from her oral cavity nor could she masticate it, so she had to expectorate it. Pharyngeally she has some reduced anterior hyolaryngeal movement but her efficiency and safety appear to be intact, in that there is no overt residue and no aspiration. Recommend that pt begin Dys 1 (puree) diet and thin liquids. Would also note that risk for aspiration on this diet could potentially fluctuate in the setting of variable alertness/mentation as well as suboptimal positioning. PO intake especially of solids (purees) may be effortful and time consuming so use of PEG will likely still be beneficial. SLP Visit Diagnosis Dysphagia, oropharyngeal phase (  R13.12) Impact on safety and function Mild aspiration risk;Moderate aspiration risk;Risk for inadequate nutrition/hydration     08/30/2022   1:00 PM Treatment Recommendations Treatment Recommendations Therapy as outlined in treatment plan below     08/30/2022   1:00 PM Prognosis Prognosis for Safe Diet Advancement Fair Barriers to Reach Goals Cognitive deficits;Language deficits   08/30/2022   1:00 PM Diet Recommendations SLP Diet Recommendations Dysphagia 1 (Puree) solids;Thin liquid Liquid Administration via Straw;Cup Medication Administration Crushed with puree Compensations Minimize environmental distractions;Slow rate;Small sips/bites;Other (Comment) Postural Changes Seated upright at 90 degrees     08/30/2022   1:00 PM Other Recommendations Oral Care Recommendations Oral care BID Other Recommendations Have oral suction available Follow Up Recommendations Home health SLP Functional Status Assessment Patient has had a recent decline in their functional status and demonstrates the ability to make  significant improvements in function in a reasonable and predictable amount of time.   08/30/2022   1:00 PM Frequency and Duration  Speech Therapy Frequency (ACUTE ONLY) min 2x/week Treatment Duration 2 weeks     08/30/2022   1:00 PM Oral Phase Oral Phase Impaired Oral - Thin Cup Holding of bolus;Reduced posterior propulsion;Lingual/palatal residue Oral - Thin Straw Holding of bolus;Reduced posterior propulsion;Lingual/palatal residue Oral - Puree Weak lingual manipulation;Reduced posterior propulsion;Delayed oral transit;Other (Comment) Oral - Pill Reduced posterior propulsion;Lingual/palatal residue;Other (Comment)    08/30/2022   1:00 PM Pharyngeal Phase Pharyngeal Phase Impaired Pharyngeal- Thin Cup Reduced anterior laryngeal mobility Pharyngeal- Thin Straw Reduced anterior laryngeal mobility Pharyngeal- Puree [facility]    08/30/2022   1:00 PM Cervical Esophageal Phase  Cervical Esophageal Phase -- Cindy Brooks., M.A. CCC-SLP Acute Rehabilitation Services Office [phone] Secure chat preferred 08/30/2022, 2:15 PM                     IR GASTROSTOMY TUBE MOD SED  Result Date: 08/29/2022 INDICATION: Dysphagia. EXAM: PERCUTANEOUS GASTROSTOMY TUBE PLACEMENT COMPARISON:  CT AP, 08/23/2022. MEDICATIONS: Vancomycin 1 gm IV; Antibiotics were administered within 1 hour of the procedure. CONTRAST:  15 mL of Isovue 300 administered into the gastric lumen. ANESTHESIA/SEDATION: Local anesthetic and single agent sedation was employed during this procedure. A total of Versed 1 mg was administered intravenously. The patient's level of consciousness and vital signs were monitored continuously by radiology nursing throughout the procedure under my direct supervision. FLUOROSCOPY TIME:  Fluoroscopic dose; 1 mGy COMPLICATIONS: None immediate. PROCEDURE: Informed written consent was obtained from the patient's family following explanation of the procedure, risks, benefits and alternatives. A time out was performed prior to the  initiation of the procedure. Maximal barrier sterile technique utilized including caps, mask, sterile gowns, sterile gloves, large sterile drape, hand hygiene and chlorhexidine prep. The left upper quadrant was sterilely prepped and draped. A oral gastric catheter was inserted into the stomach under fluoroscopy. The existing nasogastric feeding tube was removed. The left costal margin and barium opacified transverse colon were identified and avoided. Air was injected into the stomach for insufflation and visualization under fluoroscopy. Under sterile conditions and local anesthesia, 2 T tacks were utilized to pexy the anterior aspect of the stomach against the ventral abdominal wall. Contrast injection confirmed appropriate positioning of each of the T tacks. An incision was made between the T tacks and a 17 gauge trocar needle was utilized to access the stomach. Needle position was confirmed within the stomach with aspiration of air and injection of a small amount of contrast. A stiff Glidewire was advanced into the gastric lumen  and under intermittent fluoroscopic guidance, the access needle was exchanged for a telescoping peel-away sheath, ultimately allowing placement of a 18-French balloon retention gastrostomy tube. The retention balloon was insufflated with a mixture of dilute saline and contrast and pulled taut against the anterior wall of the stomach. The external disc was cinched. Contrast injection confirms positioning within the stomach. Several spot radiographic images were obtained in various obliquities for documentation. T he patient tolerated procedure well without immediate post procedural complication. FINDINGS: After successful fluoroscopic guided placement, the gastrostomy tube is appropriately positioned with internal retention balloon against the ventral aspect of the gastric lumen. IMPRESSION: Successful fluoroscopic insertion of percutaneous 16 Fr balloon-retention gastrostomy tube. PLAN:  The gastrostomy may be used immediately for medication administration and in 24 hrs for the initiation of feeds. Roanna Banning, MD [facility] Electronically Signed   By: Roanna Banning M.D.   On: 08/29/2022 20:31   DG CHEST PORT 1 VIEW  Result Date: 08/25/2022 CLINICAL DATA:  Pneumonia.  Follow-up study. EXAM: PORTABLE CHEST 1 VIEW COMPARISON:  08/20/2022 and older exams.  CT, 08/15/2022. FINDINGS: Cardiac silhouette mildly enlarged. Opacity at the left lung base obscures hemidiaphragm. Subtle hazy opacity at the right lung base accentuated by low lung volumes. Remainder of the lungs is clear. No pneumothorax. Enteric tube passes well below the diaphragm, tip in the right upper quadrant, replacing a nasogastric tube present on the previous study. IMPRESSION: 1. Persistent left lung base opacity consistent with atelectasis and/or pneumonia. Suspect a small associated effusion. 2. Mild hazy opacity at the right lung base consistent with atelectasis. Possible small right effusion. 3. No pulmonary edema.  No pneumothorax. 4. New enteric feeding tube tip projects in the right upper quadrant, likely in the first or second portion of the duodenum. Electronically Signed   By: Amie Portland M.D.   On: 08/25/2022 10:54   DG Abd Portable 1V  Result Date: 08/23/2022 CLINICAL DATA:  Feeding tube placement EXAM: PORTABLE ABDOMEN - 1 VIEW COMPARISON:  08/21/2022 FINDINGS: Non weighted enteric feeding tube is positioned with tip and side port below the diaphragm, tip at the right aspect of the lumbar column in the vicinity of the duodenal bulb. Nonobstructive pattern of included bowel gas. IMPRESSION: Non weighted enteric feeding tube is positioned with tip and side port below the diaphragm, tip at the right aspect of the lumbar column in the vicinity of the duodenal bulb. Electronically Signed   By: Jearld Lesch M.D.   On: 08/23/2022 17:12   CT ABDOMEN WO  CONTRAST  Result Date: 08/23/2022 CLINICAL DATA:  Dysphagia.  History hypertension, stroke, UTI EXAM: CT ABDOMEN WITHOUT CONTRAST TECHNIQUE: Multidetector CT imaging of the abdomen was performed following the standard protocol without IV contrast. RADIATION DOSE REDUCTION: This exam was performed according to the departmental dose-optimization program which includes automated exposure control, adjustment of the mA and/or kV according to patient size and/or use of iterative reconstruction technique. COMPARISON:  05/25/2022 FINDINGS: Lower chest: Moderate pericardial effusion. Bibasilar pleural effusions and atelectasis. Hepatobiliary: Dependent density in gallbladder question layer of tiny calculi. Liver unremarkable. Pancreas: Normal appearance Spleen: Normal appearance Adrenals/Urinary Tract: Adrenal glands, kidneys, ureters, and bladder normal appearance Stomach/Bowel: Stomach incompletely distended with suboptimal assessment of gastric wall thickness. Visualized esophagus unremarkable. Large and small bowel loops normal appearance. Vascular/Lymphatic: Atherosclerotic calcification aorta without aneurysm. RIGHT retroperitoneal phleboliths unchanged. No adenopathy. Other: No free air or free fluid.  No hernia. Musculoskeletal: Osseous structures unremarkable. IMPRESSION: Moderate  pericardial effusion. Bibasilar pleural effusions and atelectasis. Dependent density in gallbladder question layer of tiny calculi. No acute intra-abdominal abnormalities. Aortic Atherosclerosis (ICD10-I70.0). Electronically Signed   By: Ulyses Southward M.D.   On: 08/23/2022 09:33   DG Abd Portable 1V  Result Date: 08/21/2022 CLINICAL DATA:  NG tube placement. EXAM: PORTABLE ABDOMEN - 1 VIEW COMPARISON:  08/20/2022 FINDINGS: The NG tube tip is in stable position with its tip in the antropyloric region of the stomach. IMPRESSION: NG tube tip in the antropyloric region of the stomach. Electronically Signed   By: Rudie Meyer M.D.   On:  08/21/2022 09:31   DG CHEST PORT 1 VIEW  Result Date: 08/20/2022 CLINICAL DATA:  Cough, pneumonia EXAM: PORTABLE CHEST 1 VIEW COMPARISON:  08/14/2022 chest radiograph. FINDINGS: Enteric tube enters the stomach with the tip seen in the right abdomen in the region of the pylorus. Stable cardiomediastinal silhouette with normal heart size. No pneumothorax. No pleural effusions. No pulmonary edema. Hazy left retrocardiac opacity is unchanged. IMPRESSION: 1. Enteric tube enters the stomach with the tip seen in the right abdomen in the region of the pylorus. 2. Stable hazy left retrocardiac opacity, favor atelectasis. Electronically Signed   By: Delbert Phenix M.D.   On: 08/20/2022 08:45   DG Abd 1 View  Result Date: 08/20/2022 CLINICAL DATA:  NG tube placement EXAM: ABDOMEN - 1 VIEW COMPARISON:  None Available. FINDINGS: The NG tube side port and distal tip project over the stomach. The bowel gas pattern is normal. No radio-opaque calculi or other significant radiographic abnormality are seen. Visualized bibasilar lungs are clear. IMPRESSION: Adequate positioning of the NG tube, with side port and distal tip in the stomach. Electronically Signed   By: Jacob Moores M.D.   On: 08/20/2022 08:35   MR BRAIN WO CONTRAST  Result Date: 08/20/2022 CLINICAL DATA:  Delirium EXAM: MRI HEAD WITHOUT CONTRAST TECHNIQUE: Multiplanar, multiecho pulse sequences of the brain and surrounding structures were obtained without intravenous contrast. COMPARISON:  03/23/2022 FINDINGS: Brain: Increased signal on diffusion-weighted imaging in the right corona radiata (series 9, image 87), without ADC correlate, likely subacute infarcts with normalization of the ADC. These areas are new compared to 03/23/2022. Additional more punctate foci of increased signal on diffusion-weighted imaging in the right frontal lobe, without ADC correlates (series 9, image 92 and 93), are also favored to represent subacute infarcts. No acute hemorrhage,  mass, mass effect, or midline shift. No hydrocephalus or extra-axial collection. 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 Remote lacunar infarcts in the bilateral basal ganglia, bilateral thalami, and right corona radiata T2 hyperintense signal in the periventricular white matter, likely the sequela of mild-to-moderate chronic small vessel ischemic disease. Vascular: Normal arterial flow voids. Skull and upper cervical spine: Normal marrow signal. Sinuses/Orbits: Dysconjugate gaze. Mucosal thickening in the left maxillary sinus. Other: Trace fluid in left mastoid air cells. IMPRESSION: Subacute infarcts in the right frontal lobe. No acute infarct or hemorrhage. Electronically Signed   By: Wiliam Ke M.D.   On: 08/20/2022 03:10   EEG adult  Result Date: 08/17/2022 Rejeana Brock, MD     08/17/2022  7:26 PM History: 51 year old female being evaluated for encephalopathy Sedation: None Technique: This EEG was acquired with electrodes placed according to the International 10-20 electrode system (including Fp1, Fp2, F3, F4, C3, C4, P3, P4, O1, O2, T3, T4, T5, T6, A1, A2, Fz, Cz, Pz). The following electrodes were missing  or displaced: none. Background: There is a posterior dominant rhythm of 7 to 8 Hz that is seen at times.  Muscle artifact obscures much of the recording, but when the waking background is visible in addition there is generalized irregular delta and theta activities seen in the waking background. Photic stimulation: Physiologic driving is not performed EEG Abnormalities: 1) generalized irregular slow activity 2) slow posterior dominant rhythm Clinical Interpretation: This EEG is consistent with a generalized nonspecific cerebral dysfunction (encephalopathy). There was no seizure or seizure predisposition recorded on this study. Please note that lack of epileptiform activity on EEG does not preclude  the possibility of epilepsy. Ritta Slot, MD [facility] [phone] If 7pm- 7am, please page neurology on call as listed in AMION.   DG Abd 1 View  Result Date: 08/17/2022 CLINICAL DATA:  NG tube placement. EXAM: ABDOMEN - 1 VIEW COMPARISON:  05/12/2022 FINDINGS: An enteric tube is noted with tip overlying the mid stomach. The bowel gas pattern is unremarkable. IMPRESSION: Enteric tube with tip overlying the mid stomach. Electronically Signed   By: Harmon Pier M.D.   On: 08/17/2022 16:57   CT HEAD WO CONTRAST ( )  Result Date: 08/17/2022 CLINICAL DATA:  Initial evaluation for delirium. EXAM: CT HEAD WITHOUT CONTRAST TECHNIQUE: Contiguous axial images were obtained from the base of the skull through the vertex without intravenous contrast. RADIATION DOSE REDUCTION: This exam was performed according to the departmental dose-optimization program which includes automated exposure control, adjustment of the mA and/or kV according to patient size and/or use of iterative reconstruction technique. COMPARISON:  Prior CT from 08/04/2022. FINDINGS: Brain: Examination technically limited by positioning. Cerebral volume within normal limits. Scattered patchy hypodensity involving the supratentorial cerebral white matter, most consistent with chronic small vessel ischemic disease. Remote lacunar infarct noted at the left basal ganglia. No acute intracranial hemorrhage. No acute large vessel territory infarct. No mass lesion, mass effect or midline shift. No hydrocephalus or extra-axial fluid collection. Vascular: No hyperdense vessel. Skull: Scalp soft tissues and calvarium demonstrate no acute finding. Sinuses/Orbits: There is apparent curvilinear fluid signal intensity at the posterior/superior aspect of the right globe (series 6, image 17), of uncertain significance. Globes and orbital soft tissues otherwise unremarkable. Visualized paranasal sinuses are largely clear. No significant mastoid  effusion. Other: None. IMPRESSION: 1. No acute intracranial abnormality. 2. Chronic small vessel ischemic disease with remote lacunar infarct at the left basal ganglia. 3. Curvilinear fluid signal intensity at the posterior/superior aspect of the right globe as above, of uncertain significance. Correlation with physical exam recommended. Electronically Signed   By: Rise Mu M.D.   On: 08/17/2022 02:28   CT Angio Chest Pulmonary Embolism (PE) W or WO Contrast  Result Date: 08/15/2022 CLINICAL DATA:  Shortness of breath, immobility EXAM: CT ANGIOGRAPHY CHEST WITH CONTRAST TECHNIQUE: Multidetector CT imaging of the chest was performed using the standard protocol during bolus administration of intravenous contrast. Multiplanar CT image reconstructions and MIPs were obtained to evaluate the vascular anatomy. RADIATION DOSE REDUCTION: This exam was performed according to the departmental dose-optimization program which includes automated exposure control, adjustment of the mA and/or kV according to patient size and/or use of iterative reconstruction technique. CONTRAST:  60mL OMNIPAQUE IOHEXOL 350 MG/ML SOLN COMPARISON:  Chest radiograph 1 day prior, CTA chest 03/23/2022 FINDINGS: Cardiovascular: There is adequate opacification of the pulmonary arteries. There is no evidence of pulmonary embolism. The heart size is normal. There is a small pericardial effusion. The thoracic aorta is normal. Mediastinum/Nodes: There is  significant leftward mediastinal shift due to volume loss in the left hemithorax. The thyroid is unremarkable. The esophagus is grossly unremarkable. There is no mediastinal, hilar, or axillary lymphadenopathy. Lungs/Pleura: The trachea is patent. There is mucoid debris in the left-sided airways with near-complete postobstructive collapse of the left lower lobe and partial postobstructive collapse of the left upper lobe, with significant associated volume loss and leftward mediastinal shift.  The right airways are patent with mild central bronchial wall thickening. There is no focal airspace disease on the right. There is no pleural effusion or pneumothorax. There are no definite suspicious nodules, though note that the left lower lobe is not well evaluated Upper Abdomen: The imaged portions of the upper abdominal viscera are unremarkable. Musculoskeletal: There is no acute osseous abnormality or suspicious osseous lesion. Review of the MIP images confirms the above findings. IMPRESSION: 1. No evidence of pulmonary embolism. 2. Mucoid debris in the left-sided airways with near-complete postobstructive collapse of the left lower lobe and partial postobstructive collapse of the left upper lobe, with significant associated volume loss and leftward mediastinal shift. 3. Small pericardial effusion. Electronically Signed   By: Lesia Hausen M.D.   On: 08/15/2022 09:58   DG Chest Port 1 View  Result Date: 08/14/2022 CLINICAL DATA:  Questionable sepsis EXAM: PORTABLE CHEST 1 VIEW COMPARISON:  Chest x-ray 08/04/2022. FINDINGS: Cardiac silhouette is enlarged, unchanged. Blunting of the left costophrenic angle and retrocardiac opacities persist. The right lung is clear. No pneumothorax or acute fracture. IMPRESSION: Unchanged cardiomegaly with small left pleural effusion and retrocardiac atelectasis versus consolidation. Electronically Signed   By: Darliss Cheney M.D.   On: 08/14/2022 23:44   DG CHEST PORT 1 VIEW  Result Date: 08/04/2022 CLINICAL DATA:  Altered mental status EXAM: PORTABLE CHEST 1 VIEW COMPARISON:  Chest radiograph dated 05/25/2022 FINDINGS: Low lung volumes. Left retrocardiac opacities. No pneumothorax. No layering pleural effusion. Left heart border is obscured. The visualized skeletal structures are unremarkable. IMPRESSION: Left retrocardiac opacities may represent atelectasis, aspiration, or pneumonia. Electronically Signed   By: Agustin Cree M.D.   On: 08/04/2022 16:12   CT Head Wo  Contrast  Result Date: 08/04/2022 CLINICAL DATA:  Delirium ams EXAM: CT HEAD WITHOUT CONTRAST TECHNIQUE: Contiguous axial images were obtained from the base of the skull through the vertex without intravenous contrast. RADIATION DOSE REDUCTION: This exam was performed according to the departmental dose-optimization program which includes automated exposure control, adjustment of the mA and/or kV according to patient size and/or use of iterative reconstruction technique. COMPARISON:  CT head 05/26/2022 BRAIN: BRAIN Patchy and confluent areas of decreased attenuation are noted throughout the deep and periventricular white matter of the cerebral hemispheres bilaterally, compatible with chronic microvascular ischemic disease. Bilateral chronic basal ganglia and thalamic lacunar infarctions. No evidence of large-territorial acute infarction. No parenchymal hemorrhage. No mass lesion. No extra-axial collection. Cavum septum vergae variation. No mass effect or midline shift. No hydrocephalus. Basilar cisterns are patent. Vascular: No hyperdense vessel. Skull: No acute fracture or focal lesion. Sinuses/Orbits: Paranasal sinuses and mastoid air cells are clear. The orbits are unremarkable. Other: None. IMPRESSION: No acute intracranial abnormality. Electronically Signed   By: Tish Frederickson M.D.   On: 08/04/2022 16:05      Subjective:  No significant events overnight as discussed with staff, she is unable to provide any complaints. Discharge Exam: Vitals:   09/02/22 0800 09/02/22 1200  BP: (!) 134/90 116/87  Pulse: 97 88  Resp: 20 18  Temp: 98.7 F (37.1  C)   SpO2: 100% 100%   Vitals:   09/02/22 0400 09/02/22 0600 09/02/22 0800 09/02/22 1200  BP: (!) 144/88  (!) 134/90 116/87  Pulse:  85 97 88  Resp: 16 17 20 18   Temp:   98.7 F (37.1 C)   TempSrc:   Axillary   SpO2:  99% 100% 100%  Weight:      Height:        General: Patient asleep comfortably, no apparent distress, does not follow any  commands or answer any questions Cardiovascular: RRR, S1/S2 +, no rubs, no gallops Respiratory: CTA bilaterally, no wheezing, no rhonchi Abdominal: Soft, NT, ND,PEG + Extremities: no edema, no cyanosis    The results of significant diagnostics from this hospitalization (including imaging, microbiology, ancillary and laboratory) are listed below for reference.     Microbiology: No results found for this or any previous visit (from the past 240 hour(s)).   Labs: BNP (last 3 results) No results for input(s): "BNP" in the last 8760 hours. Basic Metabolic Panel: Recent Labs  Lab 08/27/22 0845 08/28/22 0817 08/29/22 0448 08/30/22 0613 09/01/22 0403  NA 140 139 134* 138 137  K 4.6 4.9 4.2 4.1 4.3  CL 101 98 97* 100 100  CO2 30 31 28 27 25   GLUCOSE 91 118* 73 97 105*  BUN 7 9 9 6  5*  CREATININE 0.68 0.71 0.71 0.82 0.83  CALCIUM 8.5* 8.7* 8.8* 9.0 9.2   Liver Function Tests: Recent Labs  Lab 08/27/22 0845  AST 26  ALT 22  ALKPHOS 98  BILITOT 0.4  PROT 5.1*  ALBUMIN 2.0*   No results for input(s): "LIPASE", "AMYLASE" in the last 168 hours. No results for input(s): "AMMONIA" in the last 168 hours. CBC: Recent Labs  Lab 08/27/22 0845 08/27/22 1920 08/28/22 0817 08/29/22 0448 08/30/22 0613 09/01/22 0403  WBC 6.5  --  7.3 6.7 6.3 6.6  NEUTROABS 3.6  --  4.1  --   --   --   HGB 6.3* 9.5* 9.2* 9.7* 9.3* 9.0*  HCT 18.2* 27.0* 26.3* 28.4* 25.9* 26.0*  MCV 82.7  --  81.7 82.8 81.7 83.3  PLT 318  --  360 356 354 346   Cardiac Enzymes: No results for input(s): "CKTOTAL", "CKMB", "CKMBINDEX", "TROPONINI" in the last 168 hours. BNP: Invalid input(s): "POCBNP" CBG: Recent Labs  Lab 09/01/22 2002 09/01/22 2347 09/02/22 0345 09/02/22 0825 09/02/22 1155  GLUCAP 95 102* 92 116* 130*   D-Dimer No results for input(s): "DDIMER" in the last 72 hours. Hgb A1c No results for input(s): "HGBA1C" in the last 72 hours. Lipid Profile No results for input(s): "CHOL", "HDL",  "LDLCALC", "TRIG", "CHOLHDL", "LDLDIRECT" in the last 72 hours. Thyroid function studies No results for input(s): "TSH", "T4TOTAL", "T3FREE", "THYROIDAB" in the last 72 hours.  Invalid input(s): "FREET3" Anemia work up No results for input(s): "VITAMINB12", "FOLATE", "FERRITIN", "TIBC", "IRON", "RETICCTPCT" in the last 72 hours. Urinalysis    Component Value Date/Time   COLORURINE YELLOW 08/15/2022 0100   APPEARANCEUR HAZY (A) 08/15/2022 0100   LABSPEC 1.016 08/15/2022 0100   PHURINE 5.0 08/15/2022 0100   GLUCOSEU NEGATIVE 08/15/2022 0100   HGBUR SMALL (A) 08/15/2022 0100   BILIRUBINUR NEGATIVE 08/15/2022 0100   KETONESUR 20 (A) 08/15/2022 0100   PROTEINUR 30 (A) 08/15/2022 0100   NITRITE NEGATIVE 08/15/2022 0100   LEUKOCYTESUR MODERATE (A) 08/15/2022 0100   Sepsis Labs Recent Labs  Lab 08/28/22 0817 08/29/22 0448 08/30/22 0613 09/01/22 0403  WBC 7.3  6.7 6.3 6.6   Microbiology No results found for this or any previous visit (from the past 240 hour(s)).   Time coordinating discharge: Over 30 minutes  SIGNED:   Huey Bienenstockawood Charlie Char, MD  [facility] 09/02/2022, 1:46 PM Pager   If 7PM-7AM, please contact night-coverage [email or web address]

## 2022-09-02 NOTE — TOC Transition Note (Signed)
Transition of Care [facility]) - CM/SW Discharge Note   Patient Details  Name: Cindy Brooks MRN: [id number] Date of Birth: 15-Jun-1971  [facility]) CM/SW Contact:  Harriet Masson, RN Phone Number: 09/02/2022, 2:48 PM   Clinical Narrative:     Patient stable for discharge.  Morrie Sheldon with [facility] notified.  Pam with [employer] to come teach daughter how to do tube feedings after daughter gets off of work at 1600. PTAR scheduled for 1800. Bedside RN aware.   Final next level of care: Home w Home Health Services Barriers to Discharge: Barriers Resolved   Patient Goals and CMS Choice Patient states their goals for this hospitalization and ongoing recovery are:: daughter wants patient to return home CMS Medicare.gov Compare Post Acute Care list provided to:: Patient Represenative (must comment) Choice offered to / list presented to : Adult Children  Discharge Placement               Home        Discharge Plan and Services   Discharge Planning Services: CM Consult Post Acute Care Choice: Durable Medical Equipment, Home Health          DME Arranged: Tube feeding DME Agency: Other - Comment Julianne Rice) Date DME Agency Contacted: 08/30/22 Time DME Agency Contacted: [phone] Representative spoke with at DME Agency: Pam HH Arranged: RN, PT, OT, Speech Therapy HH Agency: [facility] ([facility]) Date HH Agency Contacted: 09/02/22 Time HH Agency Contacted: [phone] Representative spoke with at [employer] Agency: Morrie Sheldon  Social Determinants of Health (SDOH) Interventions     Readmission Risk Interventions    09/02/2022    2:23 PM 05/28/2022    2:27 PM 03/13/2022    9:03 AM  Readmission Risk Prevention Plan  Transportation Screening Complete Complete Complete  PCP or Specialist Appt within 5-7 Days   Complete  PCP or Specialist Appt within 3-5 Days  Complete   Home Care Screening   Complete  Medication Review (RN CM)   Complete  HRI or Home Care Consult  Complete    Social Work Consult for Recovery Care Planning/Counseling  Complete   Palliative Care Screening  Not Applicable   Medication Review Oceanographer) Complete Complete   PCP or Specialist appointment within 3-5 days of discharge Complete    HRI or Home Care Consult Complete    SW Recovery Care/Counseling Consult Complete    Palliative Care Screening Complete    Skilled Nursing Facility Not Applicable

## 2022-09-02 NOTE — Progress Notes (Signed)
Nutrition Follow-up  DOCUMENTATION CODES:   Non-severe (moderate) malnutrition in context of chronic illness  INTERVENTION:   Continue Multivitamin w/ minerals, Thiamine, and folic acid daily Transition to bolus TF via PEG: 1 carton Osmolite 1.5 - five times per day Start with 1/2 carton first bolus and increase to full carton at second bolus Administer slowly over 15-20 minutes  30 mL free water flush before and after each bolus 125 mL free water flush - four times per day Provides 1778 kcal, 75 gm protein, and 1705 mL total free water daily.  NUTRITION DIAGNOSIS:   Moderate Malnutrition related to chronic illness, social / environmental circumstances (bedbound and nonverbal after TIA) as evidenced by mild muscle depletion, mild fat depletion. 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 Patient presented to the ED due to cough and worsening mental status. CXR showed unchanged cardiomegaly with small pleural effusion. UA was concerning for UTI. She was started on IV fluids and antibiotics. Palliative Care was consulted.  11/08 - NGT pulled 11/10 - Cortrak placed  11/16 - PEG placed  11/17 - diet advanced to Dysphagia 1 11/20 - transition to bolus feeds    Pt set to discharge soon. Discussed with team plan to transition to bolus feeds to trial prior to discharge to make sure pt can tolerate.  RN to stop continuous feeds and trail bolus feeds slowly. Pt diet was advanced no meal documentations have been completed.   Medications reviewed and include: Folic Acid, MVI,  Thiamine Labs reviewed: 24 hr CBGs 51-115  Diet Order:   Diet Order             DIET - DYS 1 Room service appropriate? No; Fluid consistency: Thin  Diet effective now                   EDUCATION NEEDS:   Not appropriate for education at this time  Skin:  Skin Assessment: Skin Integrity Issues: Skin Integrity Issues:: Stage II Stage II: R buttocks  Last BM:  11/19  Height:  Ht Readings from Last 1 Encounters:  08/15/22 5\' 4"  (1.626 m)   Weight:  Wt Readings from Last 1 Encounters:  08/31/22 52.1 kg   Ideal Body Weight:  54.54 kg  BMI:  Body mass index is 19.72 kg/m.  Estimated Nutritional Needs:  Kcal:  1650-1850 kcal Protein:  80-95 grams Fluid:  >/= 1.8 L/day    09/02/22 RD, LDN Clinical Dietitian See [facility] for contact information.

## 2022-09-02 NOTE — Progress Notes (Signed)
Speech Language Pathology Treatment: Dysphagia  Patient Details Name: Cindy Brooks MRN: [id number] DOB: 1971-10-09 Today's Date: 09/02/2022 Time: 0737-1062 SLP Time Calculation (min) (ACUTE ONLY): 9 min  Assessment / Plan / Recommendation Clinical Impression  Pt's swallowing appears to be similar to presentation during MBS. She has reduced oral opening that makes it more challenging to obtain purees into her oral cavity. There is also more oral holding observed, requiring cues to swallow. She is more eager to have thin liquids, grabbing the cup from SLP and feeding herself with some assistance to manage the straw. No overt s/s of aspiration are observed, but per RN, sounds like oral intake has been pretty low. Pt does have nutritional support from PEG but can continue to try purees and thin liquids as able.    HPI HPI: Cindy Brooks is a 51 y.o. female with medical history significant of recurrent UTI who presents emergency department due to cough and altered mental status.  Patient is nonverbal at baseline and bedbound due to prior stroke.  She had a two-month hospitalization in April of 2023 due to bilateral thalamic, midbrain, and bilateral cerebral infarctions. She had trach/PEG at that time and dysphagia and cognitive/language therapies. Chest CT reported "Mucoid debris in the left-sided airways with near-complete postobstructive collapse of the left lower lobe and partial postobstructive collapse of the left upper lobe, with significant associated volume loss and leftward mediastinal shift."  Most recent BSE on 03/25/22 with recommendations for Dysphagia 2 solids and thin liquids.      SLP Plan  Continue with current plan of care      Recommendations for follow up therapy are one component of a multi-disciplinary discharge planning process, led by the attending physician.  Recommendations may be updated based on patient status, additional functional criteria and insurance authorization.     Recommendations  Diet recommendations: Dysphagia 1 (puree);Thin liquid Liquids provided via: Cup;Straw Medication Administration: Via alternative means (could try crushed in puree if needed) Supervision: Staff to assist with self feeding;Full supervision/cueing for compensatory strategies Compensations:  (make sure she swallows in between bites -cue to swallow PRN) Postural Changes and/or Swallow Maneuvers: Seated upright 90 degrees                Oral Care Recommendations: Oral care QID Follow Up Recommendations: Home health SLP Assistance recommended at discharge: Frequent or constant Supervision/Assistance SLP Visit Diagnosis: Dysphagia, oropharyngeal phase (R13.12) Plan: Continue with current plan of care           Mahala Menghini., M.A. CCC-SLP Acute Rehabilitation Services Office [phone]  Secure chat preferred   09/02/2022, 11:20 AM

## 2022-09-02 NOTE — Plan of Care (Signed)

## 2022-09-02 NOTE — Discharge Instructions (Signed)
Follow with Primary MD Hoy Register, MD in 7 days   Get CBC, CMP,  checked  by Primary MD next visit.     Disposition Home    Diet: Feeding and meds via PEG tube.  She is on dysphagia 1 with thin liquid is awake enough to tolerate oral.   On your next visit with your primary care physician please Get Medicines reviewed and adjusted.   Please request your Prim.MD to go over all Hospital Tests and Procedure/Radiological results at the follow up, please get all Hospital records sent to your Prim MD by signing hospital release before you go home.   If you experience worsening of your admission symptoms, develop shortness of breath, life threatening emergency, suicidal or homicidal thoughts you must seek medical attention immediately by calling 911 or calling your MD immediately  if symptoms less severe.  You Must read complete instructions/literature along with all the possible adverse reactions/side effects for all the Medicines you take and that have been prescribed to you. Take any new Medicines after you have completely understood and accpet all the possible adverse reactions/side effects.   Do not drive when taking Pain medications.    Do not take more than prescribed Pain, Sleep and Anxiety Medications  Special Instructions: If you have smoked or chewed Tobacco  in the last 2 yrs please stop smoking, stop any regular Alcohol  and or any Recreational drug use.  Wear Seat belts while driving.   Please note  You were cared for by a hospitalist during your hospital stay. If you have any questions about your discharge medications or the care you received while you were in the hospital after you are discharged, you can call the unit and asked to speak with the hospitalist on call if the hospitalist that took care of you is not available. Once you are discharged, your primary care physician will handle any further medical issues. Please note that NO REFILLS for any discharge  medications will be authorized once you are discharged, as it is imperative that you return to your primary care physician (or establish a relationship with a primary care physician if you do not have one) for your aftercare needs so that they can reassess your need for medications and monitor your lab values.

## 2022-09-09 ENCOUNTER — Telehealth: Payer: Self-pay

## 2022-09-09 NOTE — Telephone Encounter (Signed)
Transition Care Management Unsuccessful Follow-up Telephone Call  Date of discharge and from where:  09/02/2022 [facility] [place]  Attempts:  1st Attempt  Reason for unsuccessful TCM follow-up call:  Left voice message [phone] , call back requested.

## 2022-09-10 ENCOUNTER — Telehealth: Payer: Self-pay

## 2022-09-10 NOTE — Telephone Encounter (Signed)
Transition Care Management Unsuccessful Follow-up Telephone Call  Date of discharge and from where:  09/02/2022, [facility]   Attempts:  2nd Attempt  Reason for unsuccessful TCM follow-up call:  Left voice message- [phone] , call back requested.  This is also listed as the phone number for patient's daughter, ,[name].

## 2022-09-10 NOTE — Telephone Encounter (Addendum)
Transition Care Management Follow-up Telephone Call  Patient's daughter, [name], returned my call.  Date of discharge and from where: 09/02/2022, [facility]  How have you been since you were released from the hospital? [name] said that her mother is " doing fine."  Any questions or concerns? No  Items Reviewed: Did the pt receive and understand the discharge instructions provided? Yes  Medications obtained and verified?  [name] reviewed the med list and said she has all of the medications for her mother except amantadine and folic acid which she needs prescriptions for.  She will need to purchase multivitamin and senokot OTC.  Her mother has a working nebulizer. Meds are administered via PEG Other? No  Any new allergies since your discharge? No  Dietary orders reviewed? YesZack Seal administers the tube feedings as instructed- 5 cartons Osmoloite/ day bolus feeds as well as free water flushes. She said that her mother takes some water by mouth as well as some mashed foods. No problems reported with her mother tolerating the tube feeds or oral feeds.  Do you have support at home? Yes [name] and her husband.  Home Care and Equipment/Supplies: Were home health services ordered? yes If so, what is the name of the agency? [facility]  Has the agency set up a time to come to the patient's home? Yes- they have already been out to the house.  Were any new equipment or medical supplies ordered?  Yes: tube feedings/supplies What is the name of the medical supply agency? [facility] Were you able to get the supplies/equipment? yes Do you have any questions related to the use of the equipment or supplies? No  Functional Questionnaire: (I = Independent and D = Dependent) ADLs: dependent for ADLs, bedbound, 2 person transfer. [facility] manages the foley and PEG care.   She is receiving PCS.    Follow up appointments reviewed:  PCP Hospital f/u appt confirmed? Yes  Scheduled to see Dr Alvis Lemmings-  09/19/2022.  Specialist Hospital f/u appt confirmed?  None scheduled at this time.    Are transportation arrangements needed? No  If their condition worsens, is the pt aware to call PCP or go to the Emergency Dept.? Yes Was the patient provided with contact information for the PCP's office or ED? Yes Was to pt encouraged to call back with questions or concerns? Yes

## 2022-09-10 NOTE — Telephone Encounter (Signed)
From the discharge call:   [name] said that her mother is " doing fine."  She has no questions/ concerns  Congo reviewed the med list and said she has all of the medications for her mother except amantadine and folic acid which she needs prescriptions for.  She will need to purchase multivitamin and senokot OTC.  Her mother has a working nebulizer. Meds are administered via PEG  [place] administers the tube feedings as instructed- 5 cartons Osmoloite/ day bolus feeds as well as free water flushes. She said that her mother takes some water by mouth as well as some mashed foods. No problems reported with her mother tolerating the tube feeds or oral feeds.   dependent for ADLs, bedbound, 2 person transfer. [facility] manages the foley and PEG care.    She is receiving PCS.   Scheduled to see Dr Alvis Lemmings- 09/19/2022.

## 2022-09-11 MED ORDER — FOLIC ACID 1 MG PO TABS: 1.0000 mg | ORAL_TABLET | Freq: Every day | ORAL | 1 refills | Status: DC

## 2022-09-11 NOTE — Telephone Encounter (Signed)
I called patient's daughter, [name], and informed her that Dr Alvis Lemmings has refilled her folic acid.  Regarding the amantadine, Dr Alvis Lemmings has informed her  during prior visits  that this would need to be refilled by neurology. [name] said she understood.   I also informed her that Dr Alvis Lemmings has reached out to neurology for clarification of the need for the amantadine. I told Rebbeca Paul that If Dr Alvis Lemmings has more information to share , I will call her back.

## 2022-09-11 NOTE — Telephone Encounter (Signed)
Have refilled her folic acid but I am not sure what the amantadine was prescribed for and the neurology notes do not indicate.  I informed her at previous visits that this would need to be refilled by neurology.  I reached out to the neurologist Dr. Pearlean Brownie who saw her during her hospitalization and I am waiting on a response.

## 2022-09-12 ENCOUNTER — Telehealth: Payer: Self-pay | Admitting: Family Medicine

## 2022-09-12 NOTE — Telephone Encounter (Signed)
Copied from CRM [id number]. Topic: Quick Communication - Home Health Verbal Orders >> Sep 12, 2022 12:38 PM Ja-Kwan M wrote: Caller/Agency: Conception Oms with [facility] Callback Number: [phone] Requesting OT/PT/Skilled Nursing/Social Work/Speech Therapy: skilled nursing, PT, and OT Frequency:  skilled nursing= 1 x 9 weeks except week of Christmas PT= 1 x 1 week,  2 x 4 weeks, and 1 x 4 weeks OT= 1 x 9 weeks except week of Christmas

## 2022-09-13 ENCOUNTER — Telehealth: Payer: Self-pay

## 2022-09-13 NOTE — Telephone Encounter (Signed)
Verbal orders given to [facility].   Frequency:  skilled nursing= 1 x 9 weeks except week of Christmas PT= 1 x 1 week,  2 x 4 weeks, and 1 x 4 weeks OT= 1 x 9 weeks except week of Christmas

## 2022-09-13 NOTE — Telephone Encounter (Signed)
I called patient's daughter, [name] and informed her that Dr Alvis Lemmings checked with the neurologist and her mother does not need that medication any longer.  [name] said she understood

## 2022-09-19 ENCOUNTER — Inpatient Hospital Stay: Payer: No Typology Code available for payment source | Admitting: Family Medicine

## 2022-10-03 ENCOUNTER — Other Ambulatory Visit: Payer: Self-pay | Admitting: Family Medicine

## 2022-10-03 NOTE — Telephone Encounter (Signed)
Copied from CRM [id number]. Topic: General - Other >> Oct 03, 2022  2:53 PM Everette C wrote: Reason for CRM: Medication Refill - Medication: Rx #: [id number]  metoprolol tartrate (LOPRESSOR) 50 MG tablet [[id number]]   Brilinta 90 MG  Has the patient contacted their pharmacy? Yes.   (Agent: If no, request that the patient contact the pharmacy for the refill. If patient does not wish to contact the pharmacy document the reason why and proceed with request.) (Agent: If yes, when and what did the pharmacy advise?)  Preferred Pharmacy (with phone number or street name): [id number] - [place], [place] - [address] [place] [address] Phone: [phone] Fax: [phone] Hours: Not open 24 hours  Has the patient been seen for an appointment in the last year OR does the patient have an upcoming appointment? Yes.    Agent: Please be advised that RX refills may take up to 3 business days. We ask that you follow-up with your pharmacy.

## 2022-10-04 NOTE — Telephone Encounter (Signed)
Requested medication (s) are due for refill today: yes  Requested medication (s) are on the active medication list: yes  Last refill:  09/02/22  Future visit scheduled: yes  Notes to clinic:  rxs are from the Hospital      Requested Prescriptions  Pending Prescriptions Disp Refills   metoprolol tartrate (LOPRESSOR) 50 MG tablet 600 tablet 0    Sig: Place 1 tablet (50 mg total) into feeding tube 2 (two) times daily.     Cardiovascular:  Beta Blockers Failed - 10/04/2022  4:45 PM      Failed - Last BP in normal range    BP Readings from Last 1 Encounters:  09/02/22 (!) 134/119         Passed - Last Heart Rate in normal range    Pulse Readings from Last 1 Encounters:  09/02/22 86         Passed - Valid encounter within last 6 months    Recent Outpatient Visits           3 months ago Sepsis, due to unspecified organism, unspecified whether acute organ dysfunction present [facility])   [facility] [place], Odette Horns, MD   4 months ago Antiphospholipid antibody with hypercoagulable state [facility])   [facility] Hoy Register, MD       Future Appointments             In 1 week Owen, Marzella Schlein, PA-C [facility]

## 2022-10-04 NOTE — Telephone Encounter (Signed)
Pt's daughter called to report that she is completely out of her current supply. Please advise, pt wants this prior to holiday.

## 2022-10-08 NOTE — Telephone Encounter (Signed)
Not prescribed by you.

## 2022-10-09 MED ORDER — METOPROLOL TARTRATE 50 MG PO TABS: 50.0000 mg | ORAL_TABLET | Freq: Two times a day (BID) | ORAL | 1 refills | Status: DC

## 2022-10-16 ENCOUNTER — Encounter: Payer: Self-pay | Admitting: Physician Assistant

## 2022-10-16 ENCOUNTER — Telehealth: Payer: Self-pay

## 2022-10-16 ENCOUNTER — Ambulatory Visit: Payer: No Typology Code available for payment source | Attending: Family Medicine | Admitting: Physician Assistant

## 2022-10-16 VITALS — BP 116/82 | HR 84

## 2022-10-16 DIAGNOSIS — I69398 Other sequelae of cerebral infarction: Secondary | ICD-10-CM

## 2022-10-16 DIAGNOSIS — I69391 Dysphagia following cerebral infarction: Secondary | ICD-10-CM

## 2022-10-16 DIAGNOSIS — Z09 Encounter for follow-up examination after completed treatment for conditions other than malignant neoplasm: Secondary | ICD-10-CM | POA: Diagnosis not present

## 2022-10-16 DIAGNOSIS — D508 Other iron deficiency anemias: Secondary | ICD-10-CM

## 2022-10-16 DIAGNOSIS — I69359 Hemiplegia and hemiparesis following cerebral infarction affecting unspecified side: Secondary | ICD-10-CM | POA: Diagnosis not present

## 2022-10-16 DIAGNOSIS — Z978 Presence of other specified devices: Secondary | ICD-10-CM

## 2022-10-16 DIAGNOSIS — F0631 Mood disorder due to known physiological condition with depressive features: Secondary | ICD-10-CM

## 2022-10-16 DIAGNOSIS — Z931 Gastrostomy status: Secondary | ICD-10-CM

## 2022-10-16 MED ORDER — SERTRALINE HCL 50 MG PO TABS: 50.0000 mg | ORAL_TABLET | Freq: Every day | ORAL | 3 refills | Status: DC

## 2022-10-16 MED ORDER — ATORVASTATIN CALCIUM 80 MG PO TABS: 80.0000 mg | ORAL_TABLET | Freq: Every day | ORAL | 1 refills | Status: DC

## 2022-10-16 MED ORDER — GABAPENTIN 100 MG PO CAPS
100.0000 mg | ORAL_CAPSULE | Freq: Two times a day (BID) | ORAL | 1 refills | Status: DC
  Filled 2022-12-04: qty 180, 90d supply, fill #0

## 2022-10-16 MED ORDER — AMLODIPINE BESYLATE 10 MG PO TABS: 10.0000 mg | ORAL_TABLET | Freq: Every day | ORAL | 1 refills | Status: DC

## 2022-10-16 NOTE — Progress Notes (Addendum)
Patient ID: Cindy Brooks, female   DOB: 04-20-1971, 52 y.o.   MRN: [id number]     Cindy Brooks, is a 52 y.o. female  [id number]  [id number]  DOB - [birth year]  Chief Complaint  Patient presents with   Hospitalization Follow-up       Subjective:   Cindy Brooks is a 52 y.o. female here today for a follow up visit After hospitalization 11/1-11/20/2023.  Her medications were changed and updated in the  chart.  She is here with her daughter.  Unable to accomplish ADL and in a wheelchair.  She was taken off cymbalta in the hospital due to medication interactions.  Her daughter would like to see if we can try something different.  Has indwelling catheter.  She completed brillinta and is on aspirin only now.  Patient is unable to perform and ADL without assistance.  Needs a hospital bed at home to be able to adequately care for patient at home.    Needs hospital bed -The patient has hemiparesis, dysphagia, and other medical conditions  which require positioning of her body in ways not feasible with an ordinary bed. -the patient's body needs to be positioned in ways to alleviate pain in ways that aren't possible in a regular bed. --the patient requires >30% head elevation most of the time to prevent aspiration and to help with swallowing food adequately. -the patient may require traction equipment for helpers to adequately supply ADL for the patient.    She needs a gel overlay: to prevent ulcers of the trunk or pelvis.  She is also at high risk for malnutrition due to dysphagia.  She has altered sensory perception and compromised circulatory status due to immobility  She needs APP due to impaired nutrition status, altered sensory perception, hemiparesis and compromised circulation status due to immobility and recurrent UTI.  Currently with indwelling catheter and PG tube    From discharge summary: Brief/Interim Summary:   51 yr blk female largely bedbound since  April Antiphospholipid AB with hypercoag state Chr dysphagia htn Bed-bound form prior CVA 01/22/22 Multiple subsequent admits 03/2022 for AMS, 04/2022 for UTI   Came to Ed with altered mental status on 08/15/22 Tachy 140, lactic 2.3, WBC 7, Sod 149 Admitted with likely aspiration pneumonia and severe sepsis and placed on broad-spectrum antibiotics initially  11/3 palliative care consulted 11/4 EEG shows encephalopathy with encephalopathy 11/6 pulmonary consulted for left lower lobe partial postobstructive collapse and mediastinal shift they recommended placing patient right lung down to facilitate pulmonary drainage--felt like she was going to be at high risk for aspiration regardless 11/7 neurology consulted secondary to stroke found on MRI 11/10 core track placed feeds resumed as NG abdomen postoperative times 11/16 PEG inserted by IR She is tolerating her tube feeds at time of discharge, and is is being transitioned to bolus tube feed.   Severe sepsis on admission secondary to aspiration pneumonia - CT on 11/2 showed mucoid debris in left-sided airways left overall lower and left upper lobe - Completed 7-day course of antibiotics 11/7   UTI from prior to admission likely secondary to indwelling Foley - Patient completed antibiotics   Chronic dysphagia -Initially with tube feed via core track, PEG tube to be placed 11/16. -He has been followed closely by SLP as well, she is currently on dysphagia 1 diet with thin liquids -Tube feed currently at goal   Hypoglycemia -Resolved, now she is on tube feed.   Acute metabolic encephalopathy, underlying prior CVA Possible encephalopathy from  multiple meds - Gabapentin and Cymbalta other meds all held-mentation improved over the next several days - Neurology consulted and recommending Brilinta for 4 weeks then aspirin alone no further work-up--both have been held for placement of PEG.  Now PEG has been placed she is resumed on Brilinta to  finish total of 4 weeks (another 25 days as an outpatient), and to continue with aspirin only after diet. - Metabolic work-up ammonia TSH B12 all normal - Patient is more interactive verbalizing only a little     Hyponatremia acute metabolic acidosis hypokalemia hypomagnesemia - Metabolic derangements have resolved - Periodic labs   Anemia of chronic disease with iatrogenic blood loss - Elevated ferritin level. - 1 Unit PRBC 11/14, with good response, hemoglobin been stable. -no evidence of GI bleed    Stage II right buttock pressure ulcer present from admission - Continue local wound care   Discharge Diagnoses:  Principal Problem:   Community acquired bacterial pneumonia Active Problems:   Acute metabolic encephalopathy   UTI (urinary tract infection)   CVA (cerebral vascular accident) ([place])   Pressure ulcer   Sepsis ([place])   Hypomagnesemia   Mucus plug in respiratory tract   Dysphagia   Malnutrition of moderate degree No problems updated.  ALLERGIES: Allergies  Allergen Reactions   Aspirin Hives and Rash   Codeine Hives and Rash   Penicillins Anaphylaxis, Swelling and Other (See Comments)   Strawberry Extract Anaphylaxis and Hives    PAST MEDICAL HISTORY: Past Medical History:  Diagnosis Date   Hypertension    Hypertensive urgency 01/20/2022   Seizure ([place]) 01/28/2022   Stroke ([place])    TIA (transient ischemic attack) 2019   Urinary tract infection 01/28/2022    MEDICATIONS AT HOME: Prior to Admission medications   Medication Sig Start Date End Date Taking? Authorizing Provider  amLODipine (NORVASC) 10 MG tablet Take 10 mg by mouth daily. 10/03/22  Yes [provider]  chlorhexidine (PERIDEX) 0.12 % solution Use as directed 15 mLs in the mouth or throat 2 (two) times daily. 05/28/22  Yes Georgette Shell, MD  folic acid (FOLVITE) 1 MG tablet Take 1 tablet (1 mg total) by mouth daily. 09/11/22  Yes Newlin, Enobong, MD  ipratropium-albuterol  (DUONEB) 0.5-2.5 (3) MG/3ML SOLN Take 3 mLs by nebulization every 6 (six) hours as needed. Patient taking differently: Take 3 mLs by nebulization every 6 (six) hours as needed (SOB). 03/21/22  Yes Mercy Riding, MD  metoprolol tartrate (LOPRESSOR) 50 MG tablet Place 1 tablet (50 mg total) into feeding tube 2 (two) times daily. 10/09/22  Yes Charlott Rakes, MD  sertraline (ZOLOFT) 50 MG tablet Take 1 tablet (50 mg total) by mouth daily. 10/16/22  Yes Argentina Donovan, PA-C  acetaminophen (TYLENOL) 325 MG tablet Take 2 tablets (650 mg total) by mouth every 4 (four) hours as needed. Patient not taking: Reported on 10/16/2022 03/21/22 03/21/23  Mercy Riding, MD  atorvastatin (LIPITOR) 80 MG tablet Take 1 tablet (80 mg total) by mouth daily. 10/16/22   Argentina Donovan, PA-C  Ensure Plus (ENSURE PLUS) LIQD Take 237 mLs by mouth 3 (three) times daily between meals.    [provider]  gabapentin (NEURONTIN) 100 MG capsule Take 1 capsule (100 mg total) by mouth 2 (two) times daily. 10/16/22   Argentina Donovan, PA-C  Misc. Devices MISC Urinalysis, urine culture and sensitivity 07/29/22   Charlott Rakes, MD  Multiple Vitamin (MULTIVITAMIN WITH MINERALS) TABS tablet Take 1 tablet by  mouth daily. 03/21/22   Mercy Riding, MD  Nystatin (GERHARDT'S BUTT CREAM) CREA Apply 1 application. topically daily as needed for irritation. Patient not taking: Reported on 10/16/2022 03/21/22   Mercy Riding, MD  nystatin (MYCOSTATIN) 100000 UNIT/ML suspension Take 5 mLs (500,000 Units total) by mouth 4 (four) times daily. Patient not taking: Reported on 10/16/2022 05/28/22   Georgette Shell, MD  senna-docusate (SENOKOT-S) 8.6-50 MG tablet Take 1 tablet by mouth 2 (two) times daily between meals as needed for mild constipation. Patient not taking: Reported on 10/16/2022 03/21/22   Mercy Riding, MD  thiamine 100 MG tablet Take 1 tablet (100 mg total) by mouth daily. 03/21/22   Mercy Riding, MD  Water For Irrigation, Sterile (FREE  WATER) SOLN Place 150 mLs into feeding tube every 6 (six) hours. 09/02/22   Elgergawy, Silver Huguenin, MD    ROS: Neg HEENT Neg resp Neg cardiac Neg GI Neg GU Neg MS Neg psych  Objective:   Vitals:   10/16/22 1101  BP: 116/82  Pulse: 84  SpO2: 97%   Exam General appearance : Awake, alert, not in any distress. In wheel chair.  Does not speak but she does respond appropriately to commands such as "take deep breaths," "let me look at your hand, etc": high degree of physical  and neurological debility.  Unable to speak coherently Atraumatic and Normocephalic Neck: Supple, no JVD. No cervical lymphadenopathy.  Chest: Good air entry bilaterally, CTAB.  No rales/rhonchi/wheezing CVS: S1 S2 regular, no murmurs.  Leg bag with clear urine Extremities: B/L Lower Ext shows no edema, both legs are warm to touch Neurology: Awake alert, and oriented.  Skin: No Rash  Data Review Lab Results  Component Value Date   HGBA1C 4.6 (L) 08/21/2022   HGBA1C 5.7 (H) 01/22/2022    Assessment & Plan   1. 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  Dispense: 180 capsule; Refill: 1 - atorvastatin (LIPITOR) 80 MG tablet; Take 1 tablet (80 mg total) by mouth daily.  Dispense: 90 tablet; Refill: 1  2. Other iron deficiency anemia S/p transfusion - Iron, TIBC and Ferritin Panel - CBC with Differential/Platelet  3. 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  1/2 tab daily X 1 week then increase to 1 daily - sertraline (ZOLOFT) 50 MG tablet; Take 1 tablet (50 mg total) by mouth daily.  Dispense: 30 tablet; Refill: 3  4. Hospital discharge follow-up    Return in about 6 weeks (around 11/27/2022) for antidepressant follow up ON MYCHART-me or Newlin.  The patient was given clear instructions to go to ER or return to medical  center if symptoms don't improve, worsen or new problems develop. The patient verbalized understanding. The patient was told to call to get lab results if they haven't heard anything in the next week.      Freeman Caldron, PA-C [facility], [place]   10/16/2022, 12:01 PM

## 2022-10-16 NOTE — Telephone Encounter (Signed)
I cleaned out my paperwork on Friday prior to going out on PAL. If additional paper work has come in during my absence, I will address when I am back in the office next week. Please have Levada Dy addend her note for today to indicate the medical necessity for a hospital bed as that will need to accompany whatever form I sign so another visit does not have to be conducted just for a hospital bed.Thanks.

## 2022-10-16 NOTE — Telephone Encounter (Signed)
Patient and daughter were seen today by Levada Dy and the daughter mentioned that they were waiting for a hospital bed. I talked with jane and she called and [facility] and they have nothing for her. The daughter stated that you should have gotten a paper regarding this   Please advise

## 2022-10-17 LAB — CBC WITH DIFFERENTIAL/PLATELET
Basophils Absolute: 0 10*3/uL (ref 0.0–0.2)
Basos: 1 %
EOS (ABSOLUTE): 0.1 10*3/uL (ref 0.0–0.4)
Eos: 2 %
Hematocrit: 37 % (ref 34.0–46.6)
Hemoglobin: 12.1 g/dL (ref 11.1–15.9)
Immature Grans (Abs): 0 10*3/uL (ref 0.0–0.1)
Immature Granulocytes: 0 %
Lymphocytes Absolute: 1.7 10*3/uL (ref 0.7–3.1)
Lymphs: 29 %
MCH: 29.1 pg (ref 26.6–33.0)
MCHC: 32.7 g/dL (ref 31.5–35.7)
MCV: 89 fL (ref 79–97)
Monocytes Absolute: 0.3 10*3/uL (ref 0.1–0.9)
Monocytes: 5 %
Neutrophils Absolute: 3.6 10*3/uL (ref 1.4–7.0)
Neutrophils: 63 %
Platelets: 291 10*3/uL (ref 150–450)
RBC: 4.16 x10E6/uL (ref 3.77–5.28)
RDW: 14.4 % (ref 11.7–15.4)
WBC: 5.8 10*3/uL (ref 3.4–10.8)

## 2022-10-18 NOTE — Telephone Encounter (Signed)
Noted  Fyi angela

## 2022-10-18 NOTE — Telephone Encounter (Signed)
Noted thank you

## 2022-10-30 ENCOUNTER — Telehealth: Payer: Self-pay

## 2022-10-30 ENCOUNTER — Ambulatory Visit: Payer: Self-pay | Admitting: *Deleted

## 2022-10-30 NOTE — Telephone Encounter (Signed)
Cindy Brooks with [place] with Adult Scientist, forensic calling into [facility].  An Adult Protective Services issue is what she is calling about and needs information about this pt.  I let her know she would need to talk with the provider Dr. Margarita Rana for this information.  Marsh Dolly can be reached at [phone].  I sent this information to [facility] and [facility]ness high priority Reason for Disposition  [1] Caller requests to speak ONLY to PCP AND [2] NON-URGENT question    Adult Protective Services  Answer Assessment - Initial Assessment Questions 1. REASON FOR CALL or QUESTION: "What is your reason for calling today?" or "How can I best help you?" or "What question do you have that I can help answer?"     [place] with Adult Protective Services from [facility] Of Social Services is requesting a call regarding this pt.[name] at [phone].   2. CALLER: Document the source of call. (e.g., laboratory, patient).  Protocols used: PCP Call - No Triage-A-AH

## 2022-10-30 NOTE — Telephone Encounter (Signed)
Routing to case manager 

## 2022-10-30 NOTE — Telephone Encounter (Signed)
Not that I am aware of.

## 2022-10-30 NOTE — Telephone Encounter (Signed)
I returned the call to [place]: [phone], message left with call back requested

## 2022-10-30 NOTE — Telephone Encounter (Signed)
[facility] is returning phone call. Please call [name] back  [phone].  Marsh Dolly has a secure voice mail, messages can be left.

## 2022-10-31 NOTE — Telephone Encounter (Signed)
I called Cindy Brooks/ [facility] and informed her of responses from providers.    Dr Margarita Rana did not have any concerns.  Per Roney Jaffe Clung, PA:  I don't have any concerns as far as her safety other than that associated with inability to care for herself.  She definitely needs help as she is unable to perform any ADL.  She is in a wheelchair.  She has an indwelling catheter.  Her daughter that was with her seems to be engaged and doing her best to manage.  I believe she would benefit from any/all services available for her.  She would greatly benefit from a hospital bed as well.   Cindy Brooks was very appreciative of the feedback.

## 2022-11-06 ENCOUNTER — Other Ambulatory Visit: Payer: Self-pay | Admitting: Physician Assistant

## 2022-11-06 DIAGNOSIS — Z978 Presence of other specified devices: Secondary | ICD-10-CM

## 2022-11-06 DIAGNOSIS — D6861 Antiphospholipid syndrome: Secondary | ICD-10-CM

## 2022-11-06 DIAGNOSIS — Z931 Gastrostomy status: Secondary | ICD-10-CM

## 2022-11-06 DIAGNOSIS — I69391 Dysphagia following cerebral infarction: Secondary | ICD-10-CM

## 2022-11-06 DIAGNOSIS — I69359 Hemiplegia and hemiparesis following cerebral infarction affecting unspecified side: Secondary | ICD-10-CM

## 2022-11-06 NOTE — Telephone Encounter (Signed)
Order received for hospital bed. I spoke to patient's daughter, Jacques Earthly, and she said she did not have a preference for DME providers. I explained that I can send the order to [facility] and they will be contacting her about insurance coverage and delivery.  She was in agreement with the plan.  Order then faxed to [place]

## 2022-11-13 NOTE — Telephone Encounter (Signed)
Call placed to patient's daughter, Jacques Earthly, to inform her that I received a message from [place] that they are not in network with patient's insurance so I will need to find another DME company. Message left with call back requested

## 2022-11-14 NOTE — Telephone Encounter (Signed)
I spoke to [facility] and she thinks they are in network with patient's insurance, [employer].  She requested that the order be sent to review so they can verify insurance.  Fax number confirmed: [phone] and the order was faxed as requested.

## 2022-11-20 NOTE — Telephone Encounter (Signed)
I spoke to Rebecca/ [employer] who said they are still processing the order, they needed to order the specialized mattress

## 2022-12-02 NOTE — Telephone Encounter (Signed)
I spoke to Cindy Brooks who stated that the patient received the hospital bed on 11/29/2022

## 2022-12-04 ENCOUNTER — Ambulatory Visit: Payer: No Typology Code available for payment source | Attending: Family Medicine | Admitting: Physician Assistant

## 2022-12-04 ENCOUNTER — Telehealth: Payer: Self-pay

## 2022-12-04 ENCOUNTER — Other Ambulatory Visit: Payer: Self-pay

## 2022-12-04 ENCOUNTER — Encounter: Payer: Self-pay | Admitting: Physician Assistant

## 2022-12-04 VITALS — BP 112/81 | HR 89

## 2022-12-04 DIAGNOSIS — I69359 Hemiplegia and hemiparesis following cerebral infarction affecting unspecified side: Secondary | ICD-10-CM | POA: Diagnosis not present

## 2022-12-04 DIAGNOSIS — I69398 Other sequelae of cerebral infarction: Secondary | ICD-10-CM | POA: Diagnosis not present

## 2022-12-04 DIAGNOSIS — F0631 Mood disorder due to known physiological condition with depressive features: Secondary | ICD-10-CM

## 2022-12-04 MED ORDER — FOLIC ACID 1 MG PO TABS: 1.0000 mg | ORAL_TABLET | Freq: Every day | ORAL | 1 refills | Status: DC

## 2022-12-04 MED ORDER — SERTRALINE HCL 50 MG PO TABS: 50.0000 mg | ORAL_TABLET | Freq: Every day | ORAL | 1 refills | Status: DC

## 2022-12-04 NOTE — Telephone Encounter (Signed)
I met with the patient's daughter, [name], when she was in the clinic this morning with her mother,  She explained that their [place] told her that the gabapentin was going to cost $60.   She could not understand why the medication was so expensive, her mother has [employer] and Medicaid and co-pays are usually $4.    I spoke to Atenu, pharmacist/ [facility] and she said that the co-pay for 30 days of gabapentin ( 60 pills) was $2.50 which was picked up on 10/16/2022.  I explained that her daughter was told that the gabapentin would be $60 co-pay.  I explained to Atenu that the prescription was for 3 month supply and inquired what the co-pay would be for the additional 120 pills and she said it would be $150.11.   She said the cost was related to patient's primary insurance. I told her that did not make sense. Why was the initial co-pay only $2.50 with the same insurance?  She then said that the patient's daughter would need to call Medicaid and explain that her mother has Insurance claims handler and also inform them that she needs the additional 60 days of the prescription. This still did not make sense.  I then spoke to [place] and explained the confusion with the patient's coverage for the gabapentin.  Myrlene Broker ran the charges for the gabapentin with the 2 insurances and she got a $4 copay for a 3 month supply.  When she ran the charge for a 3 month supply with just the primary insurance, the co-pay was $5.98.  I called [name] and explained my conversation with [facility] and [facility].  She was glad to know that she was not the only person that was confused about what Suzie Portela was saying.  She stated that she would like to have her mother's prescriptions switched to [facility].  I gave her the pharmacy phone number and encouraged her to call [place] with any additional questions.

## 2022-12-04 NOTE — Progress Notes (Signed)
Patient ID: Cindy Brooks, female   DOB: [birth year], 52 y.o.   MRN: [id number]   Cindy Brooks, is a 52 y.o. female  [id number]  [id number]  DOB - 1971-02-13  Chief Complaint  Patient presents with   Medication Refill       Subjective:   Cindy Brooks is a 52 y.o. female here today with her daughter to recheck how she is doing on zoloft. She feels as if the zoloft may be helping bc she doesn't seem tearful as much.  It took about 1 month but the hospital bed was provided last week.  She had to pay $60 for goodrx bc it wasn't covered under medicaid for some reason.  Needs RF folic acid and 90 d supply on zoloft.  No new issues or concerns.  Still unable to perform any ADL.    No problems updated.  ALLERGIES: Allergies  Allergen Reactions   Codeine Hives and Rash   Penicillins Anaphylaxis, Swelling and Other (See Comments)   Strawberry Extract Anaphylaxis and Hives   Aspirin Hives and Rash    Was given aspirin in the hospital in November and did fine    PAST MEDICAL HISTORY: Past Medical History:  Diagnosis Date   Hypertension    Hypertensive urgency 01/20/2022   Seizure ([place]) 01/28/2022   Stroke ([place])    TIA (transient ischemic attack) 2019   Urinary tract infection 01/28/2022    MEDICATIONS AT HOME: Prior to Admission medications   Medication Sig Start Date End Date Taking? Authorizing Provider  acetaminophen (TYLENOL) 325 MG tablet Take 2 tablets (650 mg total) by mouth every 4 (four) hours as needed. 03/21/22 03/21/23 Yes Mercy Riding, MD  amLODipine (NORVASC) 10 MG tablet Take 1 tablet (10 mg total) by mouth daily. 10/16/22  Yes Argentina Donovan, PA-C  aspirin 81 MG chewable tablet Chew by mouth daily.   Yes [provider]  atorvastatin (LIPITOR) 80 MG tablet Take 1 tablet (80 mg total) by mouth daily. 10/16/22  Yes Argentina Donovan, PA-C  chlorhexidine (PERIDEX) 0.12 % solution Use as directed 15 mLs in the mouth or throat 2 (two) times daily. 05/28/22   Yes Georgette Shell, MD  Ensure Plus (ENSURE PLUS) LIQD Take 237 mLs by mouth 3 (three) times daily between meals.   Yes [provider]  gabapentin (NEURONTIN) 100 MG capsule Take 1 capsule (100 mg total) by mouth 2 (two) times daily. 10/16/22  Yes Pao Haffey, Dionne Bucy, PA-C  ipratropium-albuterol (DUONEB) 0.5-2.5 (3) MG/3ML SOLN Take 3 mLs by nebulization every 6 (six) hours as needed. Patient taking differently: Take 3 mLs by nebulization every 6 (six) hours as needed (SOB). 03/21/22  Yes Mercy Riding, MD  metoprolol tartrate (LOPRESSOR) 50 MG tablet Place 1 tablet (50 mg total) into feeding tube 2 (two) times daily. 10/09/22  Yes Charlott Rakes, MD  Misc. Devices MISC Urinalysis, urine culture and sensitivity 07/29/22  Yes Newlin, Charlane Ferretti, MD  Multiple Vitamin (MULTIVITAMIN WITH MINERALS) TABS tablet Take 1 tablet by mouth daily. 03/21/22  Yes Mercy Riding, MD  Nystatin (GERHARDT'S BUTT CREAM) CREA Apply 1 application. topically daily as needed for irritation. 03/21/22  Yes Mercy Riding, MD  nystatin (MYCOSTATIN) 100000 UNIT/ML suspension Take 5 mLs (500,000 Units total) by mouth 4 (four) times daily. 05/28/22  Yes Georgette Shell, MD  senna-docusate (SENOKOT-S) 8.6-50 MG tablet Take 1 tablet by mouth 2 (two) times daily between meals as needed for mild constipation. 03/21/22  Yes Gonfa,  Charlesetta Ivory, MD  thiamine 100 MG tablet Take 1 tablet (100 mg total) by mouth daily. 03/21/22  Yes Mercy Riding, MD  Water For Irrigation, Sterile (FREE WATER) SOLN Place 150 mLs into feeding tube every 6 (six) hours. 09/02/22  Yes Elgergawy, Silver Huguenin, MD  folic acid (FOLVITE) 1 MG tablet Take 1 tablet (1 mg total) by mouth daily. 12/04/22   Argentina Donovan, PA-C  sertraline (ZOLOFT) 50 MG tablet Take 1 tablet (50 mg total) by mouth daily. 12/04/22   Chantz Montefusco, Dionne Bucy, PA-C    ROS: Neg HEENT Neg resp Neg cardiac Neg GI Neg GU  Objective:   Vitals:   12/04/22 0933  BP: 112/81  Pulse: 89  SpO2: 92%    Exam General appearance : Awake, alert, not in any distress. Speech Clear. Not toxic looking;  in wheelchair, able to respond to requests of "take deep breaths" or lean forward.  Appears thin and slumped over.   HEENT: Atraumatic and Normocephalic Neck: Supple, no JVD. No cervical lymphadenopathy.  Chest: Good air entry bilaterally, CTAB.  No rales/rhonchi/wheezing CVS: S1 S2 regular, no murmurs.  Extremities: B/L Lower Ext shows no edema, both legs are warm to touch Skin: No Rash  Data Review Lab Results  Component Value Date   HGBA1C 4.6 (L) 08/21/2022   HGBA1C 5.7 (H) 01/22/2022    Assessment & Plan   1. 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  Dispense: 90 tablet; Refill: 1  2. Hemiparesis due to old stroke ([place]) unchanged    Return in about 5 months (around 05/04/2023) for with PCP(Newlin) for chronic conditions.  The patient was given clear instructions to go to ER or return to medical center if symptoms don't improve, worsen or new problems develop. The patient verbalized understanding. The patient was told to call to get lab results if they haven't heard anything in the next week.      Freeman Caldron, PA-C [facility], [place]   12/04/2022, 12:28 PM

## 2022-12-05 NOTE — Telephone Encounter (Signed)
noted 

## 2022-12-09 ENCOUNTER — Ambulatory Visit: Payer: No Typology Code available for payment source | Admitting: Family Medicine

## 2023-01-10 ENCOUNTER — Telehealth: Payer: Self-pay | Admitting: Family Medicine

## 2023-01-10 ENCOUNTER — Encounter: Payer: Self-pay | Admitting: Family Medicine

## 2023-01-10 ENCOUNTER — Other Ambulatory Visit: Payer: Self-pay | Admitting: Family Medicine

## 2023-01-10 MED ORDER — CIPROFLOXACIN HCL 500 MG PO TABS: 500.0000 mg | ORAL_TABLET | Freq: Two times a day (BID) | ORAL | 0 refills | Status: AC

## 2023-01-10 NOTE — Telephone Encounter (Signed)
Please inform her urine cx received from [place] is positive for a UTI . I sent a Prescription for an antibiotics to her pharmacy. Thx

## 2023-01-13 ENCOUNTER — Telehealth: Payer: Self-pay | Admitting: Family Medicine

## 2023-01-13 NOTE — Telephone Encounter (Signed)
Copied from [place] [id number]. Topic: General - Other >> Jan 13, 2023  2:40 PM Ja-Kwan M wrote: Reason for CRM: Pt daughter Zobia Matters requests to speak with Alycia. [name] stated she was returning Alycia's call and requests call back at [phone]

## 2023-01-13 NOTE — Telephone Encounter (Signed)
Daughter has been informed of medication being filled.

## 2023-01-13 NOTE — Telephone Encounter (Signed)
LVM for patient to return phone call.  

## 2023-01-13 NOTE — Telephone Encounter (Signed)
Copied from [place] [id number]. Topic: General - Other >> Jan 13, 2023  1:12 PM Oley Balm E wrote: Reason for CRM: Pt's daughter returned missed phone call, did not see her name listed on DPR.

## 2023-04-28 ENCOUNTER — Telehealth: Payer: Self-pay | Admitting: Family Medicine

## 2023-04-28 NOTE — Telephone Encounter (Signed)
Called placed to [facility] unable to reach message VM left.

## 2023-04-28 NOTE — Telephone Encounter (Signed)
Cindy Brooks with [facility] has called requesting a PCS referral for in home services (CNA, personal care aide) for patient. [name]'s callback #:  [phone]

## 2023-04-28 NOTE — Telephone Encounter (Addendum)
Spoke with Congo from [employer]. APS has been involved with this patient and is request PCS based home evaluation. Patient last appointment for OV was in February of 2023. Patient scheduled for 05/07/2023 to discuss with PCP.

## 2023-04-30 IMAGING — CR DG PELVIS 1-2V
1 series · 1 of 1 positions shown · non-contrast
Comparison: None Available.

CLINICAL DATA: Fall, pelvic pain

EXAM:
PELVIS - 1-2 VIEW

[pelvis ap]
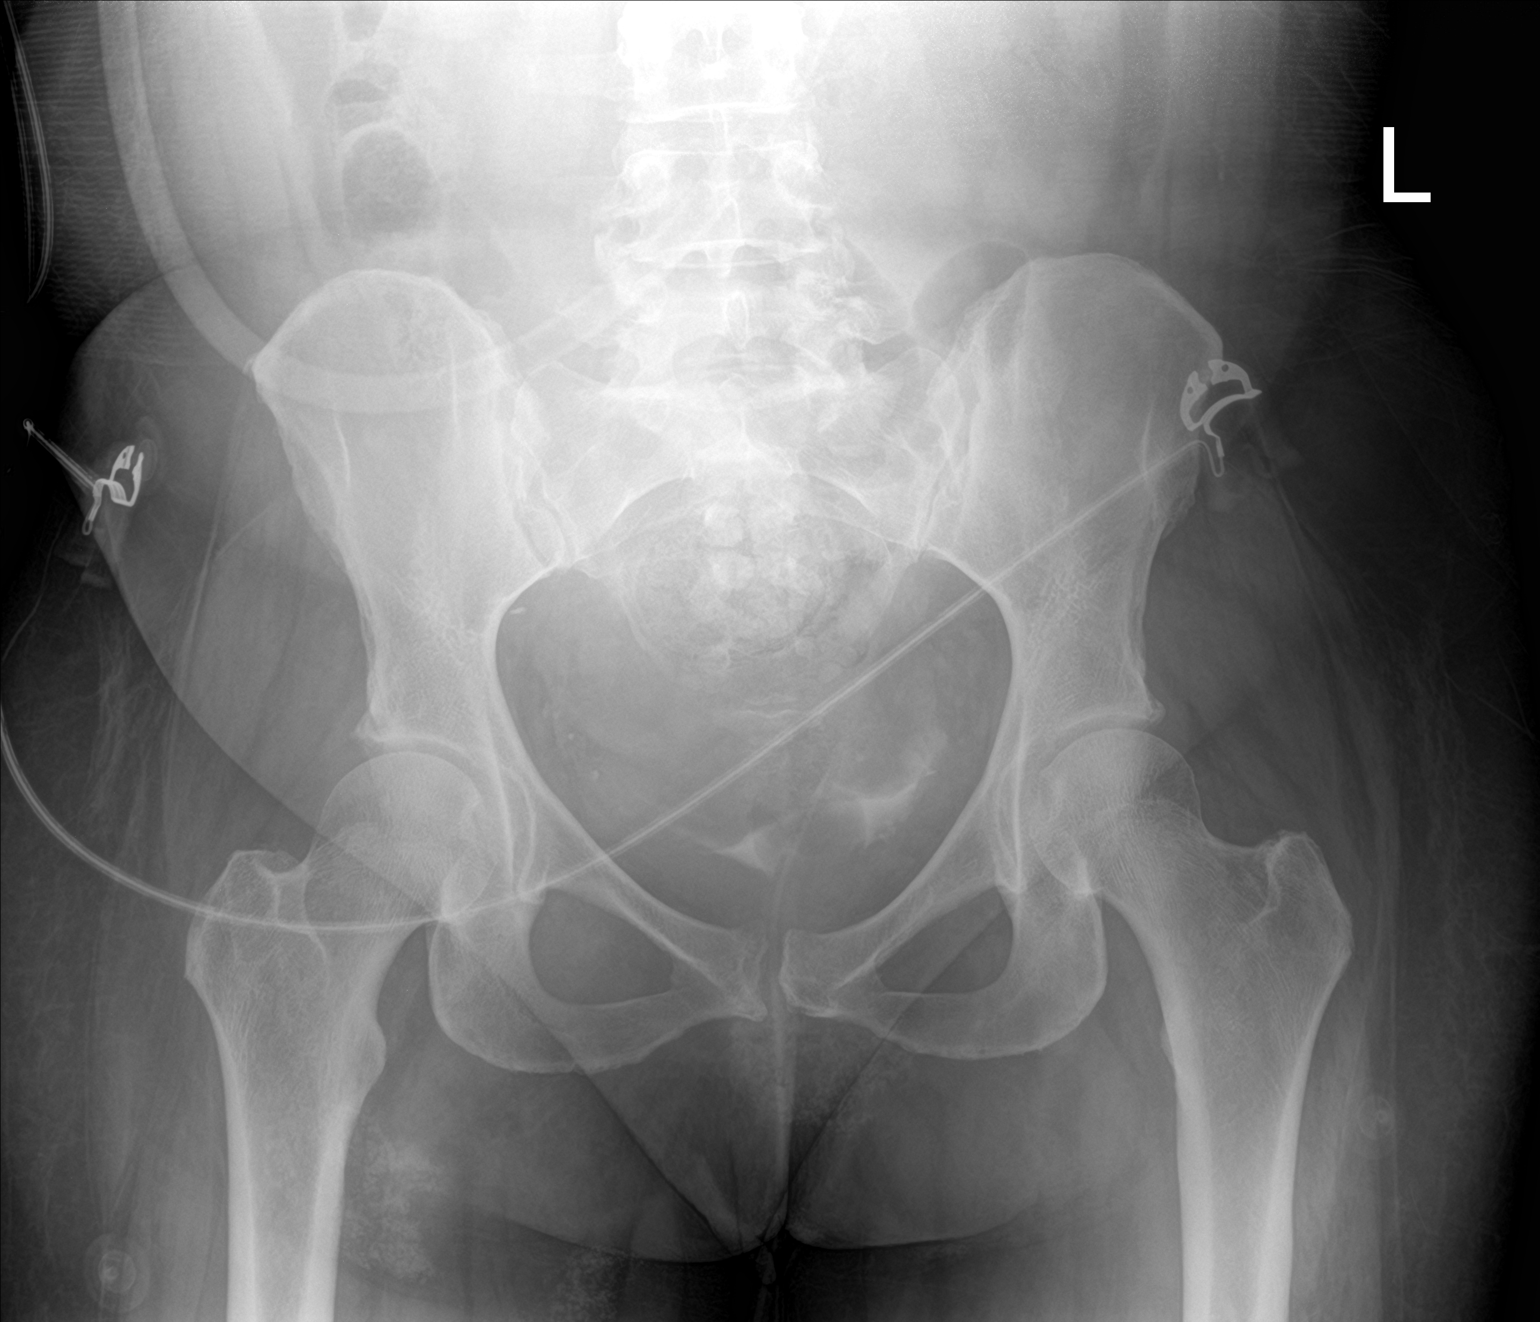

[1 of 1 positions shown; findings below may reference images not displayed]

FINDINGS: There is no evidence of pelvic fracture or diastasis. No pelvic bone
lesions are seen.
IMPRESSION: Negative.

## 2023-05-07 ENCOUNTER — Telehealth (HOSPITAL_BASED_OUTPATIENT_CLINIC_OR_DEPARTMENT_OTHER): Payer: No Typology Code available for payment source | Admitting: Family Medicine

## 2023-05-07 ENCOUNTER — Encounter: Payer: Self-pay | Admitting: Family Medicine

## 2023-05-07 DIAGNOSIS — Z931 Gastrostomy status: Secondary | ICD-10-CM

## 2023-05-07 DIAGNOSIS — I69359 Hemiplegia and hemiparesis following cerebral infarction affecting unspecified side: Secondary | ICD-10-CM

## 2023-05-07 DIAGNOSIS — F0631 Mood disorder due to known physiological condition with depressive features: Secondary | ICD-10-CM

## 2023-05-07 DIAGNOSIS — Z978 Presence of other specified devices: Secondary | ICD-10-CM | POA: Diagnosis not present

## 2023-05-07 DIAGNOSIS — I10 Essential (primary) hypertension: Secondary | ICD-10-CM | POA: Diagnosis not present

## 2023-05-07 DIAGNOSIS — I69398 Other sequelae of cerebral infarction: Secondary | ICD-10-CM

## 2023-05-07 MED ORDER — FOLIC ACID 1 MG PO TABS: 1.0000 mg | ORAL_TABLET | Freq: Every day | ORAL | 1 refills | Status: AC

## 2023-05-07 MED ORDER — METOPROLOL TARTRATE 50 MG PO TABS: 50.0000 mg | ORAL_TABLET | Freq: Two times a day (BID) | ORAL | 1 refills | Status: DC

## 2023-05-07 MED ORDER — AMLODIPINE BESYLATE 10 MG PO TABS: 10.0000 mg | ORAL_TABLET | Freq: Every day | ORAL | 1 refills | Status: DC

## 2023-05-07 MED ORDER — SERTRALINE HCL 50 MG PO TABS: 50.0000 mg | ORAL_TABLET | Freq: Every day | ORAL | 1 refills | Status: DC

## 2023-05-07 MED ORDER — ATORVASTATIN CALCIUM 80 MG PO TABS: 80.0000 mg | ORAL_TABLET | Freq: Every day | ORAL | 1 refills | Status: DC

## 2023-05-07 MED ORDER — GABAPENTIN 100 MG PO CAPS: 100.0000 mg | ORAL_CAPSULE | Freq: Two times a day (BID) | ORAL | 1 refills | Status: DC

## 2023-05-07 NOTE — Progress Notes (Signed)
Virtual Visit via Video Note  I connected with Cindy Brooks, on 05/07/2023 at 10:57 AM by video enabled telemedicine device and verified that I am speaking with the correct person using two identifiers.   Consent: I discussed the limitations, risks, security and privacy concerns of performing an evaluation and management service by telemedicine and the availability of in person appointments. I also discussed with the patient that there may be a patient responsible charge related to this service. The patient expressed understanding and agreed to proceed.   Location of Patient: Home  Location of Provider: Clinic   Persons participating in Telemedicine visit: Cindy Brooks Daughter Dr. Alvis Lemmings     History of Present Illness: Cindy Brooks is a 52 y.o. year .old female  with hypertension, previous CVA in 01/2022 with residual dysphagia (status post PEG tube), residual aphasia post stroke. Hospitalized 01/2022 - 03/2022 for bilateral thalamic, midbrain, bilateral cerebral infarctions and required PEG tube placement, tracheostomy during hospitalization.  Hypercoagulable labs were also positive for antiphospholipid syndrome History is obtained from daughter.  Discussed the use of AI scribe software for clinical note transcription with the patient, who gave verbal consent to proceed.   The patient, with a history of stroke, is currently receiving 21 hours of home health aid per week, primarily for assistance with washing and bathing. The caregiver reports that the patient is generally doing well, with stable blood pressure readings on her current regimen of amlodipine and metoprolol. The patient is also on atorvastatin for cholesterol management, sertraline for depression, and a daily aspirin due to her history of stroke.  The patient has some difficulty with feeding due to issues with chewing, and occasionally chokes on her food. As a result, the caregiver has been primarily using the  patient's feeding tube for nutrition. The patient also has a foley catheter, which is changed monthly. The caregiver reports no current distress or discomfort for the patient.      Past Medical History:  Diagnosis Date   Hypertension    Hypertensive urgency 01/20/2022   Seizure ([facility]) 01/28/2022   Stroke ([facility])    TIA (transient ischemic attack) 2019   Urinary tract infection 01/28/2022   Allergies  Allergen Reactions   Codeine Hives and Rash   Penicillins Anaphylaxis, Swelling and Other (See Comments)   Strawberry Extract Anaphylaxis and Hives   Aspirin Hives and Rash    Was given aspirin in the hospital in November and did fine    Current Outpatient Medications on File Prior to Visit  Medication Sig Dispense Refill   amLODipine (NORVASC) 10 MG tablet Take 1 tablet (10 mg total) by mouth daily. 90 tablet 1   aspirin 81 MG chewable tablet Chew by mouth daily.     atorvastatin (LIPITOR) 80 MG tablet Take 1 tablet (80 mg total) by mouth daily. 90 tablet 1   chlorhexidine (PERIDEX) 0.12 % solution Use as directed 15 mLs in the mouth or throat 2 (two) times daily. 120 mL 2   Ensure Plus (ENSURE PLUS) LIQD Take 237 mLs by mouth 3 (three) times daily between meals.     folic acid (FOLVITE) 1 MG tablet Take 1 tablet (1 mg total) by mouth daily. 90 tablet 1   gabapentin (NEURONTIN) 100 MG capsule Take 1 capsule (100 mg total) by mouth 2 (two) times daily. 180 capsule 1   ipratropium-albuterol (DUONEB) 0.5-2.5 (3) MG/3ML SOLN Take 3 mLs by nebulization every 6 (six) hours as needed. (Patient taking differently: Take 3 mLs by  nebulization every 6 (six) hours as needed (SOB).) 360 mL 1   metoprolol tartrate (LOPRESSOR) 50 MG tablet Place 1 tablet (50 mg total) into feeding tube 2 (two) times daily. 180 tablet 1   Misc. Devices MISC Urinalysis, urine culture and sensitivity 1 each 0   Multiple Vitamin (MULTIVITAMIN WITH MINERALS) TABS tablet Take 1 tablet by mouth daily.     Nystatin (GERHARDT'S  BUTT CREAM) CREA Apply 1 application. topically daily as needed for irritation.     nystatin (MYCOSTATIN) 100000 UNIT/ML suspension Take 5 mLs (500,000 Units total) by mouth 4 (four) times daily. 60 mL 2   senna-docusate (SENOKOT-S) 8.6-50 MG tablet Take 1 tablet by mouth 2 (two) times daily between meals as needed for mild constipation. 180 tablet 0   sertraline (ZOLOFT) 50 MG tablet Take 1 tablet (50 mg total) by mouth daily. 90 tablet 1   thiamine 100 MG tablet Take 1 tablet (100 mg total) by mouth daily. 30 tablet 1   Water For Irrigation, Sterile (FREE WATER) SOLN Place 150 mLs into feeding tube every 6 (six) hours.     No current facility-administered medications on file prior to visit.    ROS: Patient is nonverbal but can nod in response to questions  Observations/Objective: Awake, alert, lying comfortably in bed, at baseline Not in acute distress Normal mood Respiration observed to be normal No edema in extremities      Latest Ref Rng & Units 09/01/2022    4:03 AM 08/30/2022    6:13 AM 08/29/2022    4:48 AM  CMP  Glucose 70 - 99 mg/dL 324  97  73   BUN 6 - 20 mg/dL 5  6  9    Creatinine 0.44 - 1.00 mg/dL 4.01  0.27  2.53   Sodium 135 - 145 mmol/L 137  138  134   Potassium 3.5 - 5.1 mmol/L 4.3  4.1  4.2   Chloride 98 - 111 mmol/L 100  100  97   CO2 22 - 32 mmol/L 25  27  28    Calcium 8.9 - 10.3 mg/dL 9.2  9.0  8.8     Lipid Panel     Component Value Date/Time   CHOL 109 08/21/2022 0543   CHOL 200 (H) 07/05/2022 1046   TRIG 94 08/21/2022 0543   HDL 22 (L) 08/21/2022 0543   HDL 35 (L) 07/05/2022 1046   CHOLHDL 5.0 08/21/2022 0543   VLDL 19 08/21/2022 0543   LDLCALC 68 08/21/2022 0543   LDLCALC 131 (H) 07/05/2022 1046   LABVLDL 34 07/05/2022 1046    Lab Results  Component Value Date   HGBA1C 4.6 (L) 08/21/2022     Assessment and Plan:     Home Health Care: Patient receives 21 hours of home health care per week from [facility], primarily for  assistance with bathing. Family would prefer more hours if possible. No periods of time when patient is left alone. -Continue current home health care services. -Work on increasing hours of home health care.  Hypertension: Blood pressure controlled on current regimen of Amlodipine and Metoprolol. -Continue Amlodipine and Metoprolol. -Check blood pressure regularly.  Dysphagia: Some choking with oral intake, primarily managed with feeding tube. -Continue use of feeding tube for nutrition. -Monitor for worsening of choking symptoms.  Hyperlipidemia: Managed on Atorvastatin. -Continue Atorvastatin.  Stroke Prevention: Patient has history of stroke, currently on 81mg  Aspirin for prevention. -Continue Aspirin 81mg  daily.  Depression: Managed on Sertraline. -Continue Sertraline.  Neuropathic  Pain: Managed on Gabapentin. -Continue Gabapentin.  PEG Tube: Working well. -Continue use of PEG tube for nutrition.  Foley Catheter: Changed monthly, no current issues. -Continue monthly changes of Foley catheter.  Medication Refills: All medications need refills. -Send refills for all medications to [facility] on [address].  Follow-up: No current concerns from family. -Continue current care plan. -Encourage family to message on MyChart with any questions or concerns.        Follow Up Instructions: 6 months   I discussed the assessment and treatment plan with the patient. The patient was provided an opportunity to ask questions and all were answered. The patient agreed with the plan and demonstrated an understanding of the instructions.   The patient was advised to call back or seek an in-person evaluation if the symptoms worsen or if the condition fails to improve as anticipated.     I provided 15 minutes total of Telehealth time during this encounter including median intraservice time, reviewing previous notes, investigations, ordering medications, medical decision making,  coordinating care and patient verbalized understanding at the end of the visit.     Hoy Register, MD, FAAFP. [facility] [place], [place] [phone]   05/07/2023, 10:57 AM

## 2023-05-21 NOTE — Telephone Encounter (Signed)
Congo with [employer] has called to check the status of this PSC REFERRAL for in home aide services for the patient. Please advise and call Charlcie Cradle back with an update at [phone].

## 2023-05-23 NOTE — Telephone Encounter (Signed)
FYI

## 2023-05-26 NOTE — Telephone Encounter (Signed)
Spoke with Congo with [employer]. Advise that PCS form is being completed by our office a will faxed when completed.

## 2023-06-18 NOTE — Telephone Encounter (Signed)
Cindy Brooks with [facility] DSS is calling in to check the status of a PCS form that is supposed to be completed and faxed to the state. Cindy Brooks says she spoke with the state and they haven't received anything. Please follow up with Cindy Brooks.

## 2023-06-27 ENCOUNTER — Telehealth: Payer: Self-pay | Admitting: Family Medicine

## 2023-06-27 NOTE — Telephone Encounter (Signed)
Home Health Verbal Orders - Caller/Agency: Lorena with [facility] Callback Number:  [phone]   Requesting : Nursing Verbal Orders  PT evaluation for hoyer lift  Frequency: PT 1x1 ( once per month) 2 PRN's per month

## 2023-06-30 NOTE — Telephone Encounter (Signed)
Spoke with Lorena with [facility] nursing verbal orders given for PT evaluation for hoyer lift   Frequency: PT 1x1 ( once per month) 2 PRN's per month  Nursing for cathter care 1 X monthly

## 2023-09-01 NOTE — Telephone Encounter (Addendum)
Spoke [place] RN calling from [employer] order given for UA and culture. Asked that results be faxed to [phone] and that she notify us via phone call when they result. [place] was agreeable.

## 2023-09-01 NOTE — Telephone Encounter (Signed)
[place] RN calling from [facility] is calling to request an order for a UA - Cathter was leaking urine looked cloudy.   CB- [phone]

## 2023-09-09 ENCOUNTER — Other Ambulatory Visit: Payer: Self-pay | Admitting: Family Medicine

## 2023-09-09 MED ORDER — CIPROFLOXACIN HCL 500 MG PO TABS: 500.0000 mg | ORAL_TABLET | Freq: Two times a day (BID) | ORAL | 0 refills | Status: AC

## 2023-09-10 NOTE — Telephone Encounter (Signed)
Please see MyChart message communicated with patient on 09/09/2023.  Thanks.

## 2023-09-10 NOTE — Telephone Encounter (Signed)
G[place]ada RN from [employer] is calling to follow up with Cassandra RN regarding the patient's urine culture. Stated Associate Professor, asked to call her back, but she misplaced the number. Wanted to let her know the patient does need antibiotics and asked if labs were received from [facility]. Advised yes, labs received, and Dr. Alvis Lemmings sent in medication for pt.   [place] verbalized understanding and stated no further assistance needed.

## 2023-10-13 ENCOUNTER — Telehealth: Payer: Self-pay | Admitting: Family Medicine

## 2023-10-13 NOTE — Telephone Encounter (Signed)
Spoke with [place] K with [facility] V.O. for UA and culture given. [place] to notify  office of results.

## 2023-10-13 NOTE — Telephone Encounter (Signed)
G[place]ada K with [facility] is calling because she says she needs a UA and culture for this pt. Grenada says when she was changing pt's catheter she noticed the urine was cloudy. [place]ada says she was supposed to received a call back and hasn't heard anything. [place]ada said it can be verbal or faxed over to their office. Please follow up with [place].

## 2023-10-16 ENCOUNTER — Encounter: Payer: Self-pay | Admitting: Family Medicine

## 2023-10-16 ENCOUNTER — Telehealth: Payer: Self-pay | Admitting: Family Medicine

## 2023-10-16 NOTE — Telephone Encounter (Signed)
 Routing to PCP for review.

## 2023-10-16 NOTE — Telephone Encounter (Signed)
 Copied from CRM [id number]. Topic: General - Other >> Oct 16, 2023  1:04 PM Turkey B wrote: Reason for CRM: brittany from [facility] called in ,states pt does have a UTI

## 2023-10-17 ENCOUNTER — Other Ambulatory Visit: Payer: Self-pay | Admitting: Family Medicine

## 2023-10-17 MED ORDER — CIPROFLOXACIN HCL 500 MG PO TABS: 500.0000 mg | ORAL_TABLET | Freq: Two times a day (BID) | ORAL | 0 refills | Status: AC

## 2023-10-17 NOTE — Telephone Encounter (Signed)
 No urine culture has been sent for patient PCP has been informed.

## 2023-10-17 NOTE — Telephone Encounter (Signed)
 I sent Cindy Brooks a result note regarding this patient yesterday.  Can you please call the home health nurse?  We need the urine culture results to guide treatment. Thanks.

## 2023-10-17 NOTE — Telephone Encounter (Signed)
 Patient has been informed of medication being sent to pharmacy.

## 2023-10-20 NOTE — Telephone Encounter (Signed)
 Cindy Brooks from [facility] wants to know if Dr. Alvis Lemmings still needed the urine culture from pt. Cindy Brooks says pt was prescribed an antibiotic but wanted to know was the culture still necessary.

## 2023-10-20 NOTE — Telephone Encounter (Addendum)
 Gave verbal order to [place] with [facility].  Informed that a verbal order was given by Cassandra RN on 10/13/2023.   She was advised to fax or scan results to PCP upon arrival.

## 2023-12-10 ENCOUNTER — Ambulatory Visit: Payer: Self-pay | Admitting: Family Medicine

## 2023-12-10 ENCOUNTER — Encounter: Payer: Self-pay | Admitting: Family Medicine

## 2023-12-10 NOTE — Telephone Encounter (Signed)
 Call placed to patient's daughter and she was informed that no paperwork has been received at this time.  She states that she called the company and they will be faxing form over.

## 2023-12-10 NOTE — Telephone Encounter (Signed)
 Copied from CRM [id number]. Topic: Clinical - Red Word Triage >> Dec 10, 2023 11:14 AM Cindy Brooks wrote: Red Word that prompted transfer to Nurse Triage: out of feeding tube food as of today Daughter Cindy Brooks Order never signed by a doctor this past entire year   Patient's daughter called to report that the patient is out of her tube feeding food and when talking to [employer] she was informed that there is no signed doctor order for the food. She states that she has been getting her tube food from them for the last year and this is the first time there has been a problem. Patient's daughter would like an order sent so the patient can get her food.   They would like someone to contact either her self or the patient with an update when possible.    Reason for Disposition  Nursing judgment or information in reference  Answer Assessment - Initial Assessment Questions 1. REASON FOR CALL: "What is your main concern right now?"     Out of tube feeding food  Protocols used: No Guideline Available-A-AH

## 2023-12-11 ENCOUNTER — Telehealth: Payer: Self-pay

## 2023-12-11 NOTE — Telephone Encounter (Signed)
 Duplicate message.   Copied from CRM [id number]. Topic: Clinical - Red Word Triage >> Dec 10, 2023 11:14 AM Payton Doughty wrote: Red Word that prompted transfer to Nurse Triage: out of feeding tube food as of today Daughter Phoebe Sharps Order never signed by a doctor this past entire year

## 2023-12-11 NOTE — Telephone Encounter (Signed)
 Daughter was called and informed that paperwork has been received and faxed.      Copied from CRM [id number]. Topic: Clinical - Red Word Triage >> Dec 11, 2023  8:09 AM Fonda Kinder J wrote: Pts daughter called in to see if the forms had been faxed back to the manufacturer who makes the feeding tube food ?

## 2024-07-26 ENCOUNTER — Telehealth: Payer: Self-pay | Admitting: Family Medicine

## 2024-07-26 NOTE — Telephone Encounter (Signed)
 Copied from CRM [id number]. Topic: Clinical - Home Health Verbal Orders >> Jul 26, 2024 12:08 PM Zebedee SAUNDERS wrote:  Caller/Agency: [facility] per Cherylann Rushing Number: [phone] secure line  Service Requested: Physical Therapy Frequency: 3week 1, 2week 3 Any new concerns about the patient? No

## 2024-07-26 NOTE — Telephone Encounter (Signed)
 Copied from CRM [id number]. Topic: Clinical - Order For Equipment >> Jul 26, 2024 12:10 PM Zebedee SAUNDERS wrote:  Reason for CRM: Received call from [facility] per Cecilia ph: [phone] requesting platform walker for right upper extremity.

## 2024-07-26 NOTE — Telephone Encounter (Signed)
 Verbal order has been given for the patient.  Walker order will be submitted once PCP returns to office on 08/02/24. Patient will need video visit to get office notes for walker.

## 2024-08-02 ENCOUNTER — Ambulatory Visit: Payer: Self-pay

## 2024-08-02 NOTE — Telephone Encounter (Signed)
 FYI Only or Action Required?: Action required by provider: Order to Urine testing and PEG tube needs to be evaluated. [facility] nurse noticed purulent discharge around tube.  Patient was last seen in primary care on 05/07/2023 by Newlin, Enobong, MD.  Called Nurse Triage reporting Verbal Orders.  Symptoms began today.  Interventions attempted: Rest, hydration, or home remedies and Other: [facility] nurse will change foley tubing -  it is clogged.  Symptoms are: gradually worsening.  Triage Disposition: No disposition on file.  Patient/caregiver understands and will follow disposition?: Yes - Please relay verbal orders, if  any to [facility] nurse [place] at [phone]               Copied from CRM #[id number]. Topic: Clinical - Red Word Triage >> Aug 02, 2024  4:21 PM Rosina BIRCH wrote: Reason for CRM: brittney from [facility] called stating she need a verbal urine order for a urinary tract infection because the patient urine is dark, cloudy and smells bad. The patient catheter is clogged [phone] Reason for Disposition  Bad or foul-smelling urine  Answer Assessment - Initial Assessment Questions [facility] nurse is requesting an order for urine testing. Also PEG tube needs to be evaluated for change.     1. SYMPTOM: What's the main symptom you're concerned about? (e.g., frequency, incontinence)     Dark, off smelling, cloudy urine. Foley is clogged 2. ONSET: When did the  s/s  start?     Unsure - [facility] nurse noticed at home visit today 3. PAIN: Is there any pain? If Yes, ask: How bad is it? (Scale: 1-10; mild, moderate, severe)     unknown 4. CAUSE: What do you think is causing the symptoms?     [facility] nurse thinks this may be a UTI 5. OTHER SYMPTOMS: Do you have any other symptoms? (e.g., blood in urine, fever, flank pain, pain with urination)     Nurse also noticed that PEG tube has purulent discharge around it. Per [facility] nurse.Tube has not been  changed at all this year.  Protocols used: Urinary Symptoms-A-AH

## 2024-08-03 NOTE — Telephone Encounter (Signed)
Okay to give orders?

## 2024-08-03 NOTE — Telephone Encounter (Signed)
 Ok to give orders

## 2024-08-04 NOTE — Telephone Encounter (Signed)
Verbal orders given for patient.

## 2024-08-09 ENCOUNTER — Ambulatory Visit: Admitting: Family Medicine

## 2024-08-12 ENCOUNTER — Ambulatory Visit (HOSPITAL_BASED_OUTPATIENT_CLINIC_OR_DEPARTMENT_OTHER): Admitting: Family Medicine

## 2024-08-12 ENCOUNTER — Encounter: Payer: Self-pay | Admitting: Family Medicine

## 2024-08-12 DIAGNOSIS — I69359 Hemiplegia and hemiparesis following cerebral infarction affecting unspecified side: Secondary | ICD-10-CM

## 2024-08-12 DIAGNOSIS — F0631 Mood disorder due to known physiological condition with depressive features: Secondary | ICD-10-CM | POA: Diagnosis not present

## 2024-08-12 DIAGNOSIS — I10 Essential (primary) hypertension: Secondary | ICD-10-CM | POA: Diagnosis not present

## 2024-08-12 DIAGNOSIS — I69398 Other sequelae of cerebral infarction: Secondary | ICD-10-CM | POA: Diagnosis not present

## 2024-08-12 MED ORDER — SERTRALINE HCL 50 MG PO TABS: 50.0000 mg | ORAL_TABLET | Freq: Every day | ORAL | 1 refills | Status: DC

## 2024-08-12 MED ORDER — AMLODIPINE BESYLATE 10 MG PO TABS: 10.0000 mg | ORAL_TABLET | Freq: Every day | ORAL | 1 refills | Status: DC

## 2024-08-12 NOTE — Progress Notes (Signed)
 Virtual Visit via Video Note  I connected with Cindy Brooks, on 08/12/2024 at 12:55 PM by video enabled telemedicine device and verified that I am speaking with the correct person using two identifiers.   Consent: I discussed the limitations, risks, security and privacy concerns of performing an evaluation and management service by telemedicine and the availability of in person appointments. I also discussed with the patient that there may be a patient responsible charge related to this service. The patient expressed understanding and agreed to proceed.   Location of Patient: Home  Location of Provider: Clinic   Persons participating in Telemedicine visit: Cindy Brooks Dr. Aretha Levi Steven - Son in law   Discussed the use of AI scribe software for clinical note transcription with the patient, who gave verbal consent to proceed.  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  who presents for a follow-up to justify the need for a walker.  She primarily uses a wheelchair for ambulation and requires assistance to propel it. She is unable to push the wheels herself. She has been undergoing physical therapy and is being trained to use her walker though she is not yet walking independently with it.  She has run out of sertraline  and requires a refill. She is also low on amlodipine  for her blood pressure, which needs to be refilled. Her other medications are sufficient at this time.      Past Medical History:  Diagnosis Date   Hypertension    Hypertensive urgency 01/20/2022   Seizure (HCC) 01/28/2022   Stroke (HCC)    TIA (transient ischemic attack) 2019   Urinary tract infection 01/28/2022   Allergies  Allergen Reactions   Codeine Hives and Rash   Penicillins Anaphylaxis, Swelling and Other (See Comments)   Strawberry  Extract Anaphylaxis and Hives   Aspirin  Hives and Rash    Was given aspirin  in the hospital in November and did fine    Current Outpatient Medications on File Prior to Visit  Medication Sig Dispense Refill   aspirin  81 MG chewable tablet Chew by mouth daily.     atorvastatin  (LIPITOR ) 80 MG tablet Take 1 tablet (80 mg total) by mouth daily. 90 tablet 1   chlorhexidine  (PERIDEX ) 0.12 % solution Use as directed 15 mLs in the mouth or throat 2 (two) times daily. 120 mL 2   Ensure Plus (ENSURE PLUS) LIQD Take 237 mLs by mouth 3 (three) times daily between meals.     folic acid  (FOLVITE ) 1 MG tablet Take 1 tablet (1 mg total) by mouth daily. 90 tablet 1   gabapentin  (NEURONTIN ) 100 MG capsule Take 1 capsule (100 mg total) by mouth 2 (two) times daily. 180 capsule 1   ipratropium-albuterol  (DUONEB) 0.5-2.5 (3) MG/3ML SOLN Take 3 mLs by nebulization every 6 (six) hours as needed. (Patient taking differently: Take 3 mLs by nebulization every 6 (six) hours as needed (SOB).) 360 mL 1   metoprolol  tartrate (LOPRESSOR ) 50 MG tablet Place 1 tablet (50 mg total) into feeding tube 2 (two) times daily. 180 tablet 1   Misc. Devices MISC Urinalysis, urine culture and sensitivity 1 each 0   Multiple Vitamin (MULTIVITAMIN WITH MINERALS) TABS tablet Take 1 tablet by mouth daily.     Nystatin  (GERHARDT'S BUTT CREAM) CREA Apply 1 application. topically daily as needed for irritation.  nystatin  (MYCOSTATIN ) 100000 UNIT/ML suspension Take 5 mLs (500,000 Units total) by mouth 4 (four) times daily. 60 mL 2   senna-docusate (SENOKOT-S) 8.6-50 MG tablet Take 1 tablet by mouth 2 (two) times daily between meals as needed for mild constipation. 180 tablet 0   thiamine  100 MG tablet Take 1 tablet (100 mg total) by mouth daily. 30 tablet 1   Water  For Irrigation, Sterile (FREE WATER ) SOLN Place 150 mLs into feeding tube every 6 (six) hours.     No current facility-administered medications on file prior to visit.     ROS: See HPI  Observations/Objective: Awake, alert, oriented x3 Not in acute distress Normal mood      Latest Ref Rng & Units 09/01/2022    4:03 AM 08/30/2022    6:13 AM 08/29/2022    4:48 AM  CMP  Glucose 70 - 99 mg/dL 894  97  73   BUN 6 - 20 mg/dL 5  6  9    Creatinine 0.44 - 1.00 mg/dL 9.16  9.17  9.28   Sodium 135 - 145 mmol/L 137  138  134   Potassium 3.5 - 5.1 mmol/L 4.3  4.1  4.2   Chloride 98 - 111 mmol/L 100  100  97   CO2 22 - 32 mmol/L 25  27  28    Calcium  8.9 - 10.3 mg/dL 9.2  9.0  8.8     Lipid Panel     Component Value Date/Time   CHOL 109 08/21/2022 0543   CHOL 200 (H) 07/05/2022 1046   TRIG 94 08/21/2022 0543   HDL 22 (L) 08/21/2022 0543   HDL 35 (L) 07/05/2022 1046   CHOLHDL 5.0 08/21/2022 0543   VLDL 19 08/21/2022 0543   LDLCALC 68 08/21/2022 0543   LDLCALC 131 (H) 07/05/2022 1046   LABVLDL 34 07/05/2022 1046    Lab Results  Component Value Date   HGBA1C 4.6 (L) 08/21/2022     Assessment and plan:   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 Progressing with physical therapy towards walker use. Insurance requires documentation for walker prescription. - Prescribe walker with seat for mobility assistance. - Submit office notes to insurance for walker prescription justification.  Hypertension Hypertension managed with amlodipine . Running low on medication. - Refill amlodipine  prescription and send to [place] in [place]. -Counseled on blood pressure goal of less than 130/80, low-sodium, DASH diet, medication compliance, 150 minutes of moderate intensity exercise per week. Discussed medication compliance, adverse effects.   Depression Depression managed with sertraline . Ran out of medication. - Refill sertraline  prescription and send to [place], [facility] in [place].   General Health Maintenance Has not had recent blood work. Important to monitor medication  effects on kidneys and liver. - Schedule blood work at next clinic visit to monitor medication effects.       Meds ordered this encounter  Medications   sertraline  (ZOLOFT ) 50 MG tablet    Sig: Take 1 tablet (50 mg total) by mouth daily.    Dispense:  90 tablet    Refill:  1   amLODipine  (NORVASC ) 10 MG tablet    Sig: Take 1 tablet (10 mg total) by mouth daily.    Dispense:  90 tablet    Refill:  1    Follow Up Instructions: 3 months for chronic disease management labs   I discussed the assessment and treatment plan with the patient. The patient was provided  an opportunity to ask questions and all were answered. The patient agreed with the plan and demonstrated an understanding of the instructions.   The patient was advised to call back or seek an in-person evaluation if the symptoms worsen or if the condition fails to improve as anticipated.     I provided 15 minutes total of Telehealth time during this encounter including median intraservice time, reviewing previous notes, investigations, ordering medications, medical decision making, coordinating care and patient verbalized understanding at the end of the visit.     Corrina Sabin, MD, FAAFP. [facility] [place], [place] [phone]   08/12/2024, 12:55 PM

## 2024-08-18 ENCOUNTER — Telehealth: Payer: Self-pay | Admitting: Family Medicine

## 2024-08-18 ENCOUNTER — Telehealth: Payer: Self-pay

## 2024-08-18 MED ORDER — NITROFURANTOIN MONOHYD MACRO 100 MG PO CAPS: 100.0000 mg | ORAL_CAPSULE | Freq: Two times a day (BID) | ORAL | 0 refills | Status: DC

## 2024-08-18 NOTE — Addendum Note (Signed)
 Addended by: Ndeye Tenorio on: 08/18/2024 04:17 PM   Modules accepted: Orders

## 2024-08-18 NOTE — Telephone Encounter (Signed)
 Rx for Macrobid has been sent to her Pharmacy

## 2024-08-18 NOTE — Telephone Encounter (Signed)
 Copied from CRM [id number]. Topic: Clinical - Medication Question >> Aug 18, 2024  2:08 PM Burnard DEL wrote: Reason for CRM: Patients daughter would like to know if her mom could be prescribed something for an UTI. She stated that her urine is cloudy. She did a home test that showed positive for a UTI. She stated that her mom currently has an catheter.  [id number] - [place], [place] - [address]  Phone: [phone] Fax: [phone]

## 2024-08-18 NOTE — Telephone Encounter (Unsigned)
 Copied from CRM [id number]. Topic: Referral - Question >> Aug 18, 2024  4:39 PM Donee H wrote: Reason for CRM: Patient's daughter called with a question regarding Home Health care for patient. She states  previous provider withdrew care at the beginning of Oct. without any notice. She is just finding out today. She is wanting to know if there can be a referral placed for another Home health provider to change patient's catheter. Daughter states it's almost time for it to be changed. She also wanted to know if someone can change feeding tube for patient. She didn't know if this is something that can be done in office by pcp or if a referral is needed. Please reach back out to her [phone]

## 2024-08-18 NOTE — Telephone Encounter (Signed)
 Daughter called and she states that she is needing a new home health agency that accepts her [employer] insurance for someone to come out to the home to do her cath changes.  She is also requesting information on who she needs to talk to regarding feeding tube change.

## 2024-08-18 NOTE — Telephone Encounter (Signed)
 Routing to PCP for review.

## 2024-08-19 NOTE — Telephone Encounter (Signed)
 We can address the concern about the feeding tube at her upcoming visit. Thanks

## 2024-08-19 NOTE — Telephone Encounter (Signed)
 Copied from CRM #[id number]. Topic: General - Call Back - No Documentation >> Aug 19, 2024 11:27 AM Olam RAMAN wrote:  Reason for CRM:  pt was calling back for case manager, Romero. [phone]

## 2024-08-19 NOTE — Telephone Encounter (Signed)
 noted

## 2024-08-19 NOTE — Telephone Encounter (Signed)
 I spoke to patient's daughter, [place], and she explained that the nurse from [facility] has been coming out monthly for 2 years to change her mother's foley catheter. [employer] has been her primary insurance until now and [employer] is now primary.  Unfortunatley, [facility] is not in network with [employer] so she is is need of a referral to another home health agency. I explained to her that Dr Delbert will need to see her mother in person before we can place a home health referral and I also explained that there is no guarantee that a home health agency will accept the referral.  The agency needs to be in network with the insurance plan and have appropriate staffing.  I was able to schedule her with Dr Newlin 08/26/2024 @ 1110.   Regarding the feeding tube, she asked about having that changed as it has been 2 years since it was placed. She stated that she is not aware that her mother has seen GI or IR since then.  I told her that I would share this information with Dr Newlin.

## 2024-08-19 NOTE — Telephone Encounter (Signed)
 Duplicate message

## 2024-08-19 NOTE — Telephone Encounter (Signed)
 Call returned to patient's daughter, Cindy Brooks : [phone] , message left with call back requested.

## 2024-08-26 ENCOUNTER — Ambulatory Visit: Attending: Family Medicine | Admitting: Family Medicine

## 2024-08-26 ENCOUNTER — Encounter: Payer: Self-pay | Admitting: Family Medicine

## 2024-08-26 VITALS — BP 99/67 | HR 83

## 2024-08-26 DIAGNOSIS — R058 Other specified cough: Secondary | ICD-10-CM

## 2024-08-26 DIAGNOSIS — R059 Cough, unspecified: Secondary | ICD-10-CM | POA: Diagnosis not present

## 2024-08-26 DIAGNOSIS — F32A Depression, unspecified: Secondary | ICD-10-CM | POA: Diagnosis not present

## 2024-08-26 DIAGNOSIS — Z931 Gastrostomy status: Secondary | ICD-10-CM

## 2024-08-26 DIAGNOSIS — I69359 Hemiplegia and hemiparesis following cerebral infarction affecting unspecified side: Secondary | ICD-10-CM

## 2024-08-26 DIAGNOSIS — Z1211 Encounter for screening for malignant neoplasm of colon: Secondary | ICD-10-CM

## 2024-08-26 DIAGNOSIS — I10 Essential (primary) hypertension: Secondary | ICD-10-CM

## 2024-08-26 DIAGNOSIS — Z8673 Personal history of transient ischemic attack (TIA), and cerebral infarction without residual deficits: Secondary | ICD-10-CM

## 2024-08-26 DIAGNOSIS — Z978 Presence of other specified devices: Secondary | ICD-10-CM

## 2024-08-26 DIAGNOSIS — Z1231 Encounter for screening mammogram for malignant neoplasm of breast: Secondary | ICD-10-CM

## 2024-08-26 MED ORDER — METOPROLOL TARTRATE 50 MG PO TABS: 50.0000 mg | ORAL_TABLET | Freq: Two times a day (BID) | ORAL | 1 refills | Status: DC

## 2024-08-26 MED ORDER — MISC. DEVICES MISC: 0 refills | Status: DC

## 2024-08-26 MED ORDER — GABAPENTIN 100 MG PO CAPS: 100.0000 mg | ORAL_CAPSULE | Freq: Two times a day (BID) | ORAL | 1 refills | Status: AC

## 2024-08-26 MED ORDER — ATORVASTATIN CALCIUM 80 MG PO TABS
80.0000 mg | ORAL_TABLET | Freq: Every day | ORAL | 1 refills | Status: DC
Start: 2024-08-26 — End: 2024-09-06

## 2024-08-26 NOTE — Patient Instructions (Signed)
 VISIT SUMMARY:  Today, we addressed your home health needs and medication refills. We discussed the condition of your G-tube and Foley catheter, your physical limitations, and the need for certain health screenings. Your daughter, Hassie, was present to assist with communication and care details.  YOUR PLAN:  -GASTROSTOMY TUBE MANAGEMENT: Your G-tube, which helps with feeding, appears discolored and possibly moldy. We have referred you to interventional radiology to have it changed. We also ordered blood tests to check your overall health and advised using gauze for site care.  -INDWELLING URINARY CATHETER MANAGEMENT: Your Foley catheter, which helps with urination, needs to be changed monthly. We placed an order for home health services to manage this. If there is any delay, please visit the ER.  -HEMIPLEGIA AND HEMIPARESIS FOLLOWING CEREBRAL INFARCTION: Due to your stroke, you have significant physical limitations. We have ordered a mammogram and a Cologuard test for cancer screening.  -HYPERTENSION: Your high blood pressure is being managed with amlodipine  and metoprolol . We have refilled these medications for you.  -DEPRESSION: Your depression is being managed with sertraline . We have refilled this medication for you.  -COUGH: You have an intermittent cough, and we are concerned about the risk of aspiration. We have ordered a chest x-ray and are attempting to get coverage for a spirometer to help monitor your lung function.  -GENERAL HEALTH MAINTENANCE: We discussed routine health maintenance. A Pap smear is not practical due to your mobility issues, but we have ordered a mammogram and a Cologuard test for cancer screening.  INSTRUCTIONS:  Please follow up with interventional radiology for your G-tube change. Ensure your home health services continue to manage your Foley catheter changes monthly. If there are any delays, visit the ER. Complete the blood tests, chest x-ray, mammogram, and  Cologuard test as ordered. Continue taking your medications as prescribed. If you have any new symptoms or concerns, please contact our office.

## 2024-08-26 NOTE — Progress Notes (Signed)
 Subjective:  Patient ID: Cindy Brooks, female    DOB: Jan 30, 1971  Age: 53 y.o. MRN: [id number]  CC: Medical Management of Chronic Issues (Discuss GI tube/Catheter change/Cough )     Discussed the use of AI scribe software for clinical note transcription with the patient, who gave verbal consent to proceed.  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 She is accompanied by her daughter, Cindy Brooks, who is one of her caregivers.  The G-tube, in place for nearly three years, appears discolored and possibly moldy, and has not been changed since placement.  Daughter would like this changed.  The Foley catheter is changed monthly by home health nurses, with the last change occurring last month.  She has a new insurance and needs a new order for home health for Foley catheter change.  She has significant physical limitations due to her stroke, communicates primarily through gestures and whispers, and requires assistance for standing. She sometimes experiences a dry cough and occasionally receives water  and smoothies by mouth.Her daughter inquired about obtaining a spirometer.   She requires refills for atorvastatin , gabapentin , and metoprolol . Amlodipine  and sertraline  were refilled last month. Medications are typically filled at [facility] in [facility].  There was discussion about her physical limitations in relation to cancer screening tests such as mammogram and Cologuard.  Daughter feels she can assist her in standing to undergo the mammogram.    Past Medical History:  Diagnosis Date   Hypertension    Hypertensive urgency 01/20/2022   Seizure (HCC) 01/28/2022   Stroke [facility]    TIA (transient ischemic attack) 2019   Urinary tract infection  01/28/2022    Past Surgical History:  Procedure Laterality Date   ESOPHAGOGASTRODUODENOSCOPY N/A 01/28/2022   Procedure: ESOPHAGOGASTRODUODENOSCOPY (EGD);  Surgeon: Paola Dreama SAILOR, MD;  Location: [facility] ENDOSCOPY;  Service: General;  Laterality: N/A;   IR GASTROSTOMY TUBE MOD SED  08/29/2022   PEG PLACEMENT N/A 01/28/2022   Procedure: PERCUTANEOUS ENDOSCOPIC GASTROSTOMY (PEG) PLACEMENT;  Surgeon: Paola Dreama SAILOR, MD;  Location: [facility] ENDOSCOPY;  Service: General;  Laterality: N/A;    Family History  Problem Relation Age of Onset   Diabetes Mellitus II Maternal Grandmother     Social History   Socioeconomic History   Marital status: Single    Spouse name: Not on file   Number of children: Not on file   Years of education: Not on file   Highest education level: Not on file  Occupational History   Not on file  Tobacco Use   Smoking status: Never   Smokeless tobacco: Never  Vaping Use   Vaping status: Never Used  Substance and Sexual Activity   Alcohol use: Not Currently    Alcohol/week: 203.0 standard drinks of alcohol    Types: 84 Cans of beer, 119 Shots of liquor per week    Comment: 12pk/day and a fifth/day   Drug use: Not on file   Sexual activity: Not Currently  Other Topics Concern   Not on file  Social History Narrative   Not on file   Social Drivers of Health   Financial Resource Strain: Not on file  Food Insecurity: Not on file  Transportation Needs: Not on file  Physical Activity: Not on file  Stress: Not on  file  Social Connections: Not on file    Allergies  Allergen Reactions   Codeine Hives and Rash   Penicillins Anaphylaxis, Swelling and Other (See Comments)   Strawberry Extract Anaphylaxis and Hives   Aspirin  Hives and Rash    Was given aspirin  in the hospital in November and did fine    Outpatient Medications Prior to Visit  Medication Sig Dispense Refill   amLODipine  (NORVASC ) 10 MG tablet Take 1 tablet (10 mg total) by mouth daily. 90 tablet 1    aspirin  81 MG chewable tablet Chew by mouth daily.     chlorhexidine  (PERIDEX ) 0.12 % solution Use as directed 15 mLs in the mouth or throat 2 (two) times daily. 120 mL 2   Ensure Plus (ENSURE PLUS) LIQD Take 237 mLs by mouth 3 (three) times daily between meals.     folic acid  (FOLVITE ) 1 MG tablet Take 1 tablet (1 mg total) by mouth daily. 90 tablet 1   ipratropium-albuterol  (DUONEB) 0.5-2.5 (3) MG/3ML SOLN Take 3 mLs by nebulization every 6 (six) hours as needed. (Patient taking differently: Take 3 mLs by nebulization every 6 (six) hours as needed (SOB).) 360 mL 1   Misc. Devices MISC Urinalysis, urine culture and sensitivity 1 each 0   Multiple Vitamin (MULTIVITAMIN WITH MINERALS) TABS tablet Take 1 tablet by mouth daily.     nitrofurantoin, macrocrystal-monohydrate, (MACROBID) 100 MG capsule Take 1 capsule (100 mg total) by mouth 2 (two) times daily. 10 capsule 0   Nystatin  (GERHARDT'S BUTT CREAM) CREA Apply 1 application. topically daily as needed for irritation.     nystatin  (MYCOSTATIN ) 100000 UNIT/ML suspension Take 5 mLs (500,000 Units total) by mouth 4 (four) times daily. 60 mL 2   senna-docusate (SENOKOT-S) 8.6-50 MG tablet Take 1 tablet by mouth 2 (two) times daily between meals as needed for mild constipation. 180 tablet 0   sertraline  (ZOLOFT ) 50 MG tablet Take 1 tablet (50 mg total) by mouth daily. 90 tablet 1   thiamine  100 MG tablet Take 1 tablet (100 mg total) by mouth daily. 30 tablet 1   Water  For Irrigation, Sterile (FREE WATER ) SOLN Place 150 mLs into feeding tube every 6 (six) hours.     atorvastatin  (LIPITOR ) 80 MG tablet Take 1 tablet (80 mg total) by mouth daily. 90 tablet 1   gabapentin  (NEURONTIN ) 100 MG capsule Take 1 capsule (100 mg total) by mouth 2 (two) times daily. 180 capsule 1   metoprolol  tartrate (LOPRESSOR ) 50 MG tablet Place 1 tablet (50 mg total) into feeding tube 2 (two) times daily. 180 tablet 1   No facility-administered medications prior to visit.      ROS Review of Systems  Unable to perform ROS: Patient nonverbal    Objective:  BP 99/67   Pulse 83   LMP  (LMP Unknown)   SpO2 100%      08/26/2024   11:48 AM 12/04/2022    9:33 AM 10/16/2022   11:01 AM  BP/Weight  Systolic BP 99 112 116  Diastolic BP 67 81 82      Physical Exam Constitutional:      Appearance: She is well-developed.     Comments: Sitting comfortably in the wheelchair  Cardiovascular:     Rate and Rhythm: Normal rate.     Heart sounds: Normal heart sounds. No murmur heard. Pulmonary:     Breath sounds: No wheezing or rales.     Comments: Poor respiratory effort  Chest:  Chest wall: No tenderness.  Abdominal:     General: Bowel sounds are normal. There is no distension.     Palpations: Abdomen is soft. There is no mass.     Tenderness: There is no abdominal tenderness.     Comments: G-tube in place which is discolored with some darkish patches.  Dried brownish staining around the site of tube.  No erythema or skin, no purulence noticed  Musculoskeletal:     Right lower leg: No edema.     Left lower leg: No edema.     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        Latest Ref Rng & Units 09/01/2022    4:03 AM 08/30/2022    6:13 AM 08/29/2022    4:48 AM  CMP  Glucose 70 - 99 mg/dL 894  97  73   BUN 6 - 20 mg/dL 5  6  9    Creatinine 0.44 - 1.00 mg/dL 9.16  9.17  9.28   Sodium 135 - 145 mmol/L 137  138  134   Potassium 3.5 - 5.1 mmol/L 4.3  4.1  4.2   Chloride 98 - 111 mmol/L 100  100  97   CO2 22 - 32 mmol/L 25  27  28    Calcium  8.9 - 10.3 mg/dL 9.2  9.0  8.8     Lipid Panel     Component Value Date/Time   CHOL 109 08/21/2022 0543   CHOL 200 (H) 07/05/2022 1046   TRIG 94 08/21/2022 0543   HDL 22 (L) 08/21/2022 0543   HDL 35 (L) 07/05/2022 1046   CHOLHDL 5.0 08/21/2022 0543   VLDL 19 08/21/2022 0543    LDLCALC 68 08/21/2022 0543   LDLCALC 131 (H) 07/05/2022 1046    CBC    Component Value Date/Time   WBC 5.8 10/16/2022 1147   WBC 6.6 09/01/2022 0403   RBC 4.16 10/16/2022 1147   RBC 3.12 (L) 09/01/2022 0403   HGB 12.1 10/16/2022 1147   HCT 37.0 10/16/2022 1147   PLT 291 10/16/2022 1147   MCV 89 10/16/2022 1147   MCH 29.1 10/16/2022 1147   MCH 28.8 09/01/2022 0403   MCHC 32.7 10/16/2022 1147   MCHC 34.6 09/01/2022 0403   RDW 14.4 10/16/2022 1147   LYMPHSABS 1.7 10/16/2022 1147   MONOABS 0.8 08/28/2022 0817   EOSABS 0.1 10/16/2022 1147   BASOSABS 0.0 10/16/2022 1147    Lab Results  Component Value Date   HGBA1C 4.6 (L) 08/21/2022       Assessment & Plan Gastrostomy tube status Gastrostomy tube discolored and cloudy,. Slight oozing at site, potential blood and fluids. - Referred to interventional radiology for G tube change. - Ordered comprehensive metabolic panel. - Ordered complete blood count. - Use gauze for G tube site care.   Foley catheter in place Indwelling urinary catheter management Monthly catheter change required. Home health services needed. - Placed home health order for catheter change. - Advised ER visit if home health delayed.   History of stroke Hemiplegia and hemiparesis following cerebral infarction Mobility and communication difficulties. - Ordered home health   Primary Hypertension Controlled with amlodipine  and metoprolol . - Refilled amlodipine . - Refilled metoprolol .  Depression Managed with sertraline . - Refilled sertraline .  Cough Intermittent cough, unclear etiology. Aspiration risk considered. - Ordered chest x-ray. - Attempted spirometer coverage.  General Health Maintenance  Routine health maintenance discussed. Pap smear impractical due to mobility. Screening for breast cancer- Ordered mammogram. Screening for colon cancer- Ordered Cologuard. - Deferred Pap smear scheduling.        Meds ordered this  encounter  Medications   atorvastatin  (LIPITOR ) 80 MG tablet    Sig: Take 1 tablet (80 mg total) by mouth daily.    Dispense:  90 tablet    Refill:  1   gabapentin  (NEURONTIN ) 100 MG capsule    Sig: Take 1 capsule (100 mg total) by mouth 2 (two) times daily.    Dispense:  180 capsule    Refill:  1   metoprolol  tartrate (LOPRESSOR ) 50 MG tablet    Sig: Place 1 tablet (50 mg total) into feeding tube 2 (two) times daily.    Dispense:  180 tablet    Refill:  1   Misc. Devices MISC    Sig: Spirometer. Diagnosis - cough, history of stroke with hemiparesis    Dispense:  1 each    Refill:  0    Follow-up: Return in about 6 months (around 02/23/2025) for Chronic medical conditions.       Corrina Sabin, MD, FAAFP. [facility] [place], [place] [phone]   08/26/2024, 12:35 PM

## 2024-08-27 ENCOUNTER — Ambulatory Visit: Payer: Self-pay | Admitting: Family Medicine

## 2024-08-27 LAB — CMP14+EGFR
ALT: 19 IU/L (ref 0–32)
AST: 21 IU/L (ref 0–40)
Albumin: 5 g/dL — ABNORMAL HIGH (ref 3.8–4.9)
Alkaline Phosphatase: 103 IU/L (ref 49–135)
BUN/Creatinine Ratio: 18 (ref 9–23)
BUN: 20 mg/dL (ref 6–24)
Bilirubin Total: 0.4 mg/dL (ref 0.0–1.2)
CO2: 23 mmol/L (ref 20–29)
Calcium: 10.3 mg/dL — ABNORMAL HIGH (ref 8.7–10.2)
Chloride: 99 mmol/L (ref 96–106)
Creatinine, Ser: 1.11 mg/dL — ABNORMAL HIGH (ref 0.57–1.00)
Globulin, Total: 3 g/dL (ref 1.5–4.5)
Glucose: 122 mg/dL — ABNORMAL HIGH (ref 70–99)
Potassium: 4.6 mmol/L (ref 3.5–5.2)
Sodium: 142 mmol/L (ref 134–144)
Total Protein: 8 g/dL (ref 6.0–8.5)
eGFR: 59 mL/min/1.73 — ABNORMAL LOW (ref >59)

## 2024-08-30 ENCOUNTER — Ambulatory Visit: Payer: Self-pay

## 2024-08-30 ENCOUNTER — Inpatient Hospital Stay (HOSPITAL_COMMUNITY)
Admission: EM | Admit: 2024-08-30 | Discharge: 2024-09-06 | DRG: 393 | Disposition: A | Discharge status: Home / Self Care | Attending: Internal Medicine | Admitting: Internal Medicine

## 2024-08-30 DIAGNOSIS — I69359 Hemiplegia and hemiparesis following cerebral infarction affecting unspecified side: Secondary | ICD-10-CM

## 2024-08-30 DIAGNOSIS — E440 Moderate protein-calorie malnutrition: Secondary | ICD-10-CM | POA: Insufficient documentation

## 2024-08-30 DIAGNOSIS — N189 Chronic kidney disease, unspecified: Secondary | ICD-10-CM | POA: Insufficient documentation

## 2024-08-30 DIAGNOSIS — I69320 Aphasia following cerebral infarction: Secondary | ICD-10-CM

## 2024-08-30 DIAGNOSIS — I69391 Dysphagia following cerebral infarction: Secondary | ICD-10-CM

## 2024-08-30 DIAGNOSIS — Z681 Body mass index (BMI) 19 or less, adult: Secondary | ICD-10-CM

## 2024-08-30 DIAGNOSIS — I639 Cerebral infarction, unspecified: Secondary | ICD-10-CM | POA: Diagnosis present

## 2024-08-30 DIAGNOSIS — E43 Unspecified severe protein-calorie malnutrition: Secondary | ICD-10-CM | POA: Diagnosis present

## 2024-08-30 DIAGNOSIS — I69351 Hemiplegia and hemiparesis following cerebral infarction affecting right dominant side: Secondary | ICD-10-CM

## 2024-08-30 DIAGNOSIS — K9423 Gastrostomy malfunction: Principal | ICD-10-CM | POA: Diagnosis present

## 2024-08-30 DIAGNOSIS — I959 Hypotension, unspecified: Secondary | ICD-10-CM | POA: Diagnosis not present

## 2024-08-30 DIAGNOSIS — R64 Cachexia: Secondary | ICD-10-CM | POA: Diagnosis present

## 2024-08-30 DIAGNOSIS — Z79899 Other long term (current) drug therapy: Secondary | ICD-10-CM

## 2024-08-30 DIAGNOSIS — L8990 Pressure ulcer of unspecified site, unspecified stage: Secondary | ICD-10-CM | POA: Insufficient documentation

## 2024-08-30 DIAGNOSIS — I10 Essential (primary) hypertension: Secondary | ICD-10-CM | POA: Diagnosis present

## 2024-08-30 DIAGNOSIS — Z7982 Long term (current) use of aspirin: Secondary | ICD-10-CM

## 2024-08-30 DIAGNOSIS — Z751 Person awaiting admission to adequate facility elsewhere: Secondary | ICD-10-CM

## 2024-08-30 DIAGNOSIS — Z833 Family history of diabetes mellitus: Secondary | ICD-10-CM

## 2024-08-30 DIAGNOSIS — R5381 Other malaise: Secondary | ICD-10-CM | POA: Diagnosis present

## 2024-08-30 DIAGNOSIS — F32A Depression, unspecified: Secondary | ICD-10-CM | POA: Diagnosis present

## 2024-08-30 DIAGNOSIS — Y848 Other medical procedures as the cause of abnormal reaction of the patient, or of later complication, without mention of misadventure at the time of the procedure: Secondary | ICD-10-CM | POA: Diagnosis present

## 2024-08-30 DIAGNOSIS — Z978 Presence of other specified devices: Secondary | ICD-10-CM

## 2024-08-30 DIAGNOSIS — R1312 Dysphagia, oropharyngeal phase: Secondary | ICD-10-CM | POA: Diagnosis present

## 2024-08-30 DIAGNOSIS — E785 Hyperlipidemia, unspecified: Secondary | ICD-10-CM | POA: Diagnosis present

## 2024-08-30 DIAGNOSIS — E861 Hypovolemia: Secondary | ICD-10-CM | POA: Diagnosis present

## 2024-08-30 DIAGNOSIS — Z7401 Bed confinement status: Secondary | ICD-10-CM

## 2024-08-30 LAB — BASIC METABOLIC PANEL WITH GFR
Anion gap: 9 (ref 5–15)
BUN: 16 mg/dL (ref 6–20)
CO2: 26 mmol/L (ref 22–32)
Calcium: 9.8 mg/dL (ref 8.9–10.3)
Chloride: 101 mmol/L (ref 98–111)
Creatinine, Ser: 1.07 mg/dL — ABNORMAL HIGH (ref 0.44–1.00)
GFR, Estimated: >60 mL/min (ref >60)
Glucose, Bld: 84 mg/dL (ref 70–99)
Potassium: 3.7 mmol/L (ref 3.5–5.1)
Sodium: 136 mmol/L (ref 135–145)

## 2024-08-30 LAB — CBC
HCT: 35.3 % — ABNORMAL LOW (ref 36.0–46.0)
Hemoglobin: 11.8 g/dL — ABNORMAL LOW (ref 12.0–15.0)
MCH: 28.4 pg (ref 26.0–34.0)
MCHC: 33.4 g/dL (ref 30.0–36.0)
MCV: 85.1 fL (ref 80.0–100.0)
Platelets: 204 K/uL (ref 150–400)
RBC: 4.15 MIL/uL (ref 3.87–5.11)
RDW: 14 % (ref 11.5–15.5)
WBC: 4.5 K/uL (ref 4.0–10.5)
nRBC: 0 % (ref 0.0–0.2)

## 2024-08-30 NOTE — Hospital Course (Addendum)
   Denied nausea and diarrhea and feels better    Sleeping in the bed in no acte distress, did not eat dinner from last night too, shue needs huelp for eating    Admitted 08/30/2024  Allergies: Codeine, Penicillins, Strawberry extract, and Aspirin  Pertinent Hx: CVA, non verbal, hemiplegia, dysphagia with PEG tube, chronic indwelling foley  53 y.o. female p/w pulled PEG tube out  *PEG tube replacement: IR consulted for placement *Dysphagia: chronic. Fam has been feeding. SLP *Cough: SLP. CXR ordered to r/o aspiration PNA *Med dispense not congruent: need to call [facility].   Consults: IR VTE ppx: Holding Lovenox  Diet: NPO, SLP ordered

## 2024-08-30 NOTE — Telephone Encounter (Signed)
 FYI Only or Action Required?: FYI only for provider: ED advised.  Patient was last seen in primary care on 08/26/2024 by Newlin, Enobong, MD.  Called Nurse Triage reporting Advice Only.  Triage Disposition: Go to ED Now (Notify PCP)  Patient/caregiver understands and will follow disposition?: Yes           Copied from CRM #[id number]. Topic: Clinical - Red Word Triage >> Aug 30, 2024  4:10 PM Cindy Brooks wrote: Red Word that prompted transfer to Nurse Triage: pt daughter stated pt pulled feeding tube out and wants to know if she should take her to the ER or wait until tomorrow's appt. Reason for Disposition  Nursing judgment  Protocols used: No Guideline or Reference Available-A-AH   This RN recommends pt goes to ED. Pt daughter agreeable.  This RN spoke with pt's daughter, Cindy Brooks. Pt pulled out her feeding tube today (G-tube) Came out 3 hours ago Stoma has no symptoms Denies pain, vomiting, trouble breathing

## 2024-08-30 NOTE — ED Provider Notes (Signed)
 [place] EMERGENCY DEPARTMENT AT [facility] Provider Note   CSN: [id number] Arrival date & time: 08/30/24  2032     Patient presents with: Peg Tube Dislodged    Cindy Brooks is a 53 y.o. female.   This is a 53 year old female prior stroke with hemiparesis requiring PEG tube for dysphagia presenting the emergency department with dislodged PEG tube.  Patient unable to provide history.  Reportedly patient pulled the tube out herself roughly 5 hours prior to my evaluation.        Prior to Admission medications   Medication Sig Start Date End Date Taking? Authorizing Provider  amLODipine  (NORVASC ) 10 MG tablet Take 1 tablet (10 mg total) by mouth daily. 08/12/24   Newlin, Enobong, MD  aspirin  81 MG chewable tablet Chew by mouth daily.    [provider]  atorvastatin  (LIPITOR ) 80 MG tablet Take 1 tablet (80 mg total) by mouth daily. 08/26/24   Newlin, Enobong, MD  chlorhexidine  (PERIDEX ) 0.12 % solution Use as directed 15 mLs in the mouth or throat 2 (two) times daily. 05/28/22   Will Almarie MATSU, MD  Ensure Plus (ENSURE PLUS) LIQD Take 237 mLs by mouth 3 (three) times daily between meals.    [provider]  folic acid  (FOLVITE ) 1 MG tablet Take 1 tablet (1 mg total) by mouth daily. 05/07/23   Newlin, Enobong, MD  gabapentin  (NEURONTIN ) 100 MG capsule Take 1 capsule (100 mg total) by mouth 2 (two) times daily. 08/26/24   Newlin, Enobong, MD  ipratropium-albuterol  (DUONEB) 0.5-2.5 (3) MG/3ML SOLN Take 3 mLs by nebulization every 6 (six) hours as needed. Patient taking differently: Take 3 mLs by nebulization every 6 (six) hours as needed (SOB). 03/21/22   Gonfa, Taye T, MD  metoprolol  tartrate (LOPRESSOR ) 50 MG tablet Place 1 tablet (50 mg total) into feeding tube 2 (two) times daily. 08/26/24   Newlin, Enobong, MD  Misc. Devices MISC Urinalysis, urine culture and sensitivity 07/29/22   Delbert Clam, MD  Misc. Devices MISC Spirometer. Diagnosis - cough,  history of stroke with hemiparesis 08/26/24   Delbert Clam, MD  Multiple Vitamin (MULTIVITAMIN WITH MINERALS) TABS tablet Take 1 tablet by mouth daily. 03/21/22   Gonfa, Taye T, MD  nitrofurantoin, macrocrystal-monohydrate, (MACROBID) 100 MG capsule Take 1 capsule (100 mg total) by mouth 2 (two) times daily. 08/18/24   Newlin, Enobong, MD  Nystatin  (GERHARDT'S BUTT CREAM) CREA Apply 1 application. topically daily as needed for irritation. 03/21/22   Gonfa, Taye T, MD  nystatin  (MYCOSTATIN ) 100000 UNIT/ML suspension Take 5 mLs (500,000 Units total) by mouth 4 (four) times daily. 05/28/22   Will Almarie MATSU, MD  senna-docusate (SENOKOT-S) 8.6-50 MG tablet Take 1 tablet by mouth 2 (two) times daily between meals as needed for mild constipation. 03/21/22   Gonfa, Taye T, MD  sertraline  (ZOLOFT ) 50 MG tablet Take 1 tablet (50 mg total) by mouth daily. 08/12/24   Newlin, Enobong, MD  thiamine  100 MG tablet Take 1 tablet (100 mg total) by mouth daily. 03/21/22   Gonfa, Taye T, MD  Water  For Irrigation, Sterile (FREE WATER ) SOLN Place 150 mLs into feeding tube every 6 (six) hours. 09/02/22   Elgergawy, Brayton RAMAN, MD    Allergies: Codeine, Penicillins, Strawberry extract, and Aspirin     Review of Systems  Updated Vital Signs BP (!) 140/91   Pulse 80   Temp 98 F (36.7 C)   Resp 20   LMP  (LMP Unknown)   SpO2 99%  Physical Exam Vitals and nursing note reviewed.  Constitutional:      General: She is not in acute distress. HENT:     Nose: Nose normal.     Mouth/Throat:     Mouth: Mucous membranes are moist.  Eyes:     Conjunctiva/sclera: Conjunctivae normal.  Cardiovascular:     Rate and Rhythm: Normal rate.  Pulmonary:     Effort: Pulmonary effort is normal.  Abdominal:     Comments: Prior PEG tube in place.  Soft nondistended abdomen.  Neurological:     Mental Status: She is alert.     (all labs ordered are listed, but only abnormal results are displayed) Labs Reviewed  CBC -  Abnormal; Notable for the following components:      Result Value   Hemoglobin 11.8 (*)    HCT 35.3 (*)    All other components within normal limits  BASIC METABOLIC PANEL WITH GFR - Abnormal; Notable for the following components:   Creatinine, Ser 1.07 (*)    All other components within normal limits    EKG: EKG Interpretation Date/Time:  Monday August 30 2024 21:01:48 EST Ventricular Rate:  92 PR Interval:  174 QRS Duration:  74 QT Interval:  344 QTC Calculation: 426 R Axis:   263  Text Interpretation: Sinus rhythm Inferior infarct, old Anterior infarct, old Confirmed by Neysa Clap [phone]) on 08/30/2024 9:54:16 PM  Radiology: No results found.   Procedures   Medications Ordered in the ED - No data to display  Clinical Course as of 08/30/24 2338  Mon Aug 30, 2024  2126 Unable to pass PEG tube or small Foley.  Will get basic labs and plan to admit for PEG tube placement. [TY]  2337 Spoke with IM teaching for admission.  [TY]    Clinical Course User Index [TY] Neysa Clap PARAS, DO                                 Medical Decision Making 53 year old with prior stroke with dysphagia requiring PEG tube presenting after PEG tube dislodged.  Vital signs reassuring.  Benign abdominal exam.  At baseline mentation.  EMS reported some tachycardia, but have not observed here.  EKG sinus rhythm with no ischemic changes.  No significant metabolic derangements.  Stable anemia.  Unfortunately, unable to replace PEG tube.  Will admit for IR placement.  Amount and/or Complexity of Data Reviewed Labs: ordered. ECG/medicine tests: ordered.  Risk Decision regarding hospitalization.       Final diagnoses:  PEG tube malfunction [facility]    ED Discharge Orders     None          Neysa Clap PARAS, DO 08/30/24 2323

## 2024-08-30 NOTE — ED Triage Notes (Addendum)
 Pt to ED via [facility] from home. Per family pt has had peg tube in for 2 years and she receives feeding through tube. Pt pulled tube out a few hours ago. GCS 13 which is patient baseline. HX: stroke. Pt has foley bag. Pt tachy with EMS HR 110

## 2024-08-30 NOTE — H&P (Incomplete)
 Date: 08/30/2024               Patient Name:  Cindy Brooks MRN: [id number]  DOB: [birth year] Age / Sex: 53 y.o., female   PCP: Delbert Clam, MD         Medical Service: Internal Medicine Teaching Service         Attending Physician: Dr. Mliss Foot      First Contact: Armando Rossetti, MD  Pager:  [phone]  Second Contact: Dr. Saad Nooruddin, MD   Pager:  [phone]       After Hours  (After 5pm / First Contact Pager: [phone]  weekends / holidays): Second Contact Pager: [phone]   SUBJECTIVE   Chief Complaint: PEG tube fell out  History of Present Illness: Cindy Brooks is a 53 y.o. female with PMH of prior CVA with dysphagia requiring PEG tube. Has been out 5 or 6 hours. Benign   Non-verbal at baseline. Daughter Cindy Brooks was present at bedside and provided history.   Cindy Brooks is one of Cindy Brooks daughters but is not primary caregiver. Patient lives with daughter ***. Today *** came to the the home and noted that G tube had come out ~6 PM. She usually gets tube feeds ~ 3x per day; last one ~4:30 PM    Cindy Brooks is also concerned that she has a UTI because the color of her pee has been dark. She has a chronic indwelling urethral catheter  No chest pain, shortness of breath, abdominal pain. No discomfort around her suprapubic   Denies numbness or tingling.  Has loose bowel movements last week. None this week. ***last BM 11/17 ~7:30 PM No swelling in her feet.   The G tube had needed to be changed from some time. She had been to her PCP earlier in the month. At the time, they thought there was some   Patient communicates with [facility] by whispering. With other daugthers, she usually points. Cindy Brooks does not see mother frequently. Perhaps there has been some decline in her interaction but the patient denies this.    ED Course: Vitals were *** Labs significant for *** Imaging *** Received *** Consulted ***  Meds:  *** No outpatient medications have been marked as taking for  the 08/30/24 encounter [facility] Encounter).    Past Medical History Past Medical History:  Diagnosis Date  . Hypertension   . Hypertensive urgency 01/20/2022  . Seizure (HCC) 01/28/2022  . Stroke (HCC)   . TIA (transient ischemic attack) 2019  . Urinary tract infection 01/28/2022    Past Surgical History Past Surgical History:  Procedure Laterality Date  . ESOPHAGOGASTRODUODENOSCOPY N/A 01/28/2022   Procedure: ESOPHAGOGASTRODUODENOSCOPY (EGD);  Surgeon: Paola Dreama SAILOR, MD;  Location[facility] ENDOSCOPY;  Service: General;  Laterality: N/A;  . IR GASTROSTOMY TUBE MOD SED  08/29/2022  . PEG PLACEMENT N/A 01/28/2022   Procedure: PERCUTANEOUS ENDOSCOPIC GASTROSTOMY (PEG) PLACEMENT;  Surgeon: Paola Dreama SAILOR, MD;  Location: [facility] ENDOSCOPY;  Service: General;  Laterality: N/A;    Social:  Lives With: *** Occupation: *** Support: *** Level of Function: *** PCP: *** Substances: -Tobacco: *** -Alcohol: *** -Recreational Drug: ***  Family History: *** Family History  Problem Relation Age of Onset  . Diabetes Mellitus II Maternal Grandmother     Allergies: Allergies as of 08/30/2024 - Review Complete 08/26/2024  Allergen Reaction Noted  . Codeine Hives and Rash 05/29/2015  . Penicillins Anaphylaxis, Swelling, and Other (See Comments) 01/20/2022  . Strawberry extract Anaphylaxis and Hives 01/20/2022  .  Aspirin  Hives and Rash 05/29/2015    Review of Systems: A complete ROS was negative except as per HPI.   OBJECTIVE:   Physical Exam: Blood pressure (!) 140/91, pulse 80, temperature 98 F (36.7 C), resp. rate 20, SpO2 99%.  Constitutional: alert ***, well-appearing *** sitting in ***, in no acute distress HENT: normocephalic atraumatic, mucous membranes moist Eyes: conjunctiva non-erythematous Neck: supple Cardiovascular: regular rate and rhythm, no m/r/g Pulmonary/Chest: normal work of breathing on room air, lungs clear to auscultation bilaterally Abdominal: bowel sounds  ***, soft, non-tender, non-distended MSK: normal bulk and tone Neurological: alert & oriented x 3, 5/5 strength in bilateral upper and lower extremities, normal gait Skin: warm and dry Psych: ***  Labs: CBC    Component Value Date/Time   WBC 4.5 08/30/2024 2131   RBC 4.15 08/30/2024 2131   HGB 11.8 (L) 08/30/2024 2131   HGB 12.1 10/16/2022 1147   HCT 35.3 (L) 08/30/2024 2131   HCT 37.0 10/16/2022 1147   PLT 204 08/30/2024 2131   PLT 291 10/16/2022 1147   MCV 85.1 08/30/2024 2131   MCV 89 10/16/2022 1147   MCH 28.4 08/30/2024 2131   MCHC 33.4 08/30/2024 2131   RDW 14.0 08/30/2024 2131   RDW 14.4 10/16/2022 1147   LYMPHSABS 1.7 10/16/2022 1147   MONOABS 0.8 08/28/2022 0817   EOSABS 0.1 10/16/2022 1147   BASOSABS 0.0 10/16/2022 1147     CMP     Component Value Date/Time   NA 136 08/30/2024 2131   NA 142 08/26/2024 1230   K 3.7 08/30/2024 2131   CL 101 08/30/2024 2131   CO2 26 08/30/2024 2131   GLUCOSE 84 08/30/2024 2131   BUN 16 08/30/2024 2131   BUN 20 08/26/2024 1230   CREATININE 1.07 (H) 08/30/2024 2131   CREATININE 0.86 06/14/2022 1040   CALCIUM  9.8 08/30/2024 2131   PROT 8.0 08/26/2024 1230   ALBUMIN 5.0 (H) 08/26/2024 1230   AST 21 08/26/2024 1230   AST 13 (L) 06/14/2022 1040   ALT 19 08/26/2024 1230   ALT 10 06/14/2022 1040   ALKPHOS 103 08/26/2024 1230   BILITOT 0.4 08/26/2024 1230   BILITOT 0.9 06/14/2022 1040   GFRNONAA >60 08/30/2024 2131   GFRNONAA >60 06/14/2022 1040    ***  Imaging:  DG Swallowing Func-Speech Pathology Table formatting from the original result was not included. Objective Swallowing Evaluation: Type of Study: MBS-Modified Barium  Swallow Study   Patient Details  Name: Cindy Brooks MRN: [id number] Date of Birth: 31-May-1971  Today's Date: 08/30/2022 Time: SLP Start Time (ACUTE ONLY): 1321 -SLP Stop Time (ACUTE ONLY): 1340  SLP Time Calculation (min) (ACUTE ONLY): 19 min  Past Medical History:  Past Medical History:   Diagnosis Date  . Hypertension   . Hypertensive urgency 01/20/2022  . Seizure (HCC) 01/28/2022  . Stroke (HCC)   . TIA (transient ischemic attack) 2019  . Urinary tract infection 01/28/2022   Past Surgical History:  Past Surgical History:  Procedure Laterality Date  . ESOPHAGOGASTRODUODENOSCOPY N/A 01/28/2022   Procedure: ESOPHAGOGASTRODUODENOSCOPY (EGD);  Surgeon: Paola Dreama SAILOR,  MD;  Location: [facility] ENDOSCOPY;  Service: General;  Laterality: N/A;  . IR GASTROSTOMY TUBE MOD SED  08/29/2022  . PEG PLACEMENT N/A 01/28/2022   Procedure: PERCUTANEOUS ENDOSCOPIC GASTROSTOMY (PEG) PLACEMENT;  Surgeon:  Paola Dreama SAILOR, MD;  Location: [facility] ENDOSCOPY;  Service: General;   Laterality: N/A;   HPI: Kassadee Carawan is a 53 y.o. female with medical history significant of  recurrent UTI who presents emergency department due to cough and altered  mental status.  Patient is nonverbal at baseline and bedbound due to prior  stroke.  She had a two-month hospitalization in April of 2023 due to  bilateral thalamic, midbrain, and bilateral cerebral infarctions. She had  trach/PEG at that time and dysphagia and cognitive/language therapies.  Chest CT reported Mucoid debris in the left-sided airways with  near-complete postobstructive collapse of the left lower lobe and partial  postobstructive collapse of the left upper lobe, with significant  associated volume loss and leftward mediastinal shift.  Most recent BSE  on 03/25/22 with recommendations for Dysphagia 2 solids and thin liquids.   Subjective: pt alert but minimally verbal   Recommendations for follow up therapy are one component of a  multi-disciplinary discharge planning process, led by the attending  physician.  Recommendations may be updated based on patient status,  additional functional criteria and insurance authorization.  Assessment / Plan / Recommendation    08/30/2022    1:00 PM  Clinical Impressions  Clinical Impression Pt  presents with an oral more than pharyngeal  dysphagia. She has does not open her lips and mouth far enough to accept a  spoon, although the tip of a spoon can be placed in her mouth for pt to  more passively receive purees. As a result she gets fairly small boluses  at a time despite cues and assistance provided to try to increase bolus  size. Posterior lingual propulsion is slow to initiate especially with  purees, with SLP providing repeated cues per bolus to swallow. She could  not clear the barium tablet from her oral cavity nor could she masticate  it, so she had to expectorate it. Pharyngeally she has some reduced  anterior hyolaryngeal movement but her efficiency and safety appear to be  intact, in that there is no overt residue and no aspiration. Recommend  that pt begin Dys 1 (puree) diet and thin liquids. Would also note that  risk for aspiration on this diet could potentially fluctuate in the  setting of variable alertness/mentation as well as suboptimal positioning.  PO intake especially of solids (purees) may be effortful and time  consuming so use of PEG will likely still be beneficial.  SLP Visit Diagnosis Dysphagia, oropharyngeal phase (R13.12)  Impact on safety and function Mild aspiration risk;Moderate aspiration  risk;Risk for inadequate nutrition/hydration         08/30/2022    1:00 PM  Treatment Recommendations  Treatment Recommendations Therapy as outlined in treatment plan below        08/30/2022    1:00 PM  Prognosis  Prognosis for Safe Diet Advancement Fair  Barriers to Reach Goals Cognitive deficits;Language deficits     08/30/2022    1:00 PM  Diet Recommendations  SLP Diet Recommendations Dysphagia 1 (Puree) solids;Thin liquid  Liquid Administration via Straw;Cup  Medication Administration Crushed with puree  Compensations Minimize environmental distractions;Slow rate;Small  sips/bites;Other (Comment)  Postural Changes Seated upright at 90 degrees         08/30/2022    1:00 PM  Other Recommendations  Oral Care Recommendations Oral care BID  Other Recommendations Have oral suction available  Follow Up Recommendations Home health SLP  Functional Status Assessment Patient has had a recent decline in their  functional status and demonstrates the ability to make significant  improvements in function in a reasonable and predictable amount of time.     08/30/2022    1:00 PM  Frequency and Duration   Speech Therapy Frequency (ACUTE ONLY) min 2x/week  Treatment Duration 2 weeks        08/30/2022    1:00 PM  Oral Phase  Oral Phase Impaired  Oral - Thin Cup Holding of bolus;Reduced posterior  propulsion;Lingual/palatal residue  Oral - Thin Straw Holding of bolus;Reduced posterior  propulsion;Lingual/palatal residue  Oral - Puree Weak lingual manipulation;Reduced posterior  propulsion;Delayed oral transit;Other (Comment)  Oral - Pill Reduced posterior propulsion;Lingual/palatal residue;Other  (Comment)       08/30/2022    1:00 PM  Pharyngeal Phase  Pharyngeal Phase Impaired  Pharyngeal- Thin Cup Reduced anterior laryngeal mobility  Pharyngeal- Thin Straw Reduced anterior laryngeal mobility  Pharyngeal- Puree [facility]       08/30/2022    1:00 PM  Cervical Esophageal Phase   Cervical Esophageal Phase --   Leita SAILOR., M.A. CCC-SLP Acute Rehabilitation Services Office [phone]  Secure chat preferred  08/30/2022, 2:15 PM                                     EKG: personally reviewed my interpretation is***. Prior EKG***  ASSESSMENT & PLAN:   Assessment & Plan by Problem: Active Problems:   * No active hospital problems. *   Mairin Lindsley is a 53 y.o. person living with a history of *** who presented with *** and admitted for *** on hospital day 0  *** ***  *** ***  *** ***  Diet: {NAMES:3044014::Normal,Heart Healthy,Carb-Modified,Renal,Carb/Renal,NPO,TPN,Tube Feeds} VTE:  {NAMES:3044014::Heparin ,Enoxaparin ,SCDs,DOAC,None} IVF: {NAMES:3044014::None,NS,1/2 NS,LR,D5,D10},{NAMES:3044014::None,10cc/hr,25cc/hr,50cc/hr,75cc/hr,100cc/hr,110cc/hr,125cc/hr,Bolus} Code: {NAMES:3044014::Full,DNR,DNI,DNR/DNI,Comfort Care,Unknown} Surrogate Decision Maker: ***  Prior to Admission Living Arrangement: {NAMES:3044014::Home, living ***,SNF, ***,Homeless,***} Anticipated Discharge Location: {NAMES:3044014::Home,SNF,CIR,***} Barriers to Discharge: ***  Dispo: Admit patient to {STATUS:3044014::Observation with expected length of stay less than 2 midnights.,Inpatient with expected length of stay greater than 2 midnights.}  Signed: Napoleon Limes, MD Internal Medicine Resident PGY-1 08/30/2024, 11:33 PM   Please contact IM Residency On-Call Pager at: [phone] or [phone].

## 2024-08-30 NOTE — H&P (Signed)
 Date: 08/31/2024               Patient Name:  Cindy Brooks MRN: [id number]  DOB: 1971-05-09 Age / Sex: 53 y.o., female   PCP: Delbert Clam, MD         Medical Service: Internal Medicine Teaching Service         Attending Physician: Dr. Mliss Foot      First Contact: Armando Rossetti, MD  Pager:  [phone]  Second Contact: Dr. Saad Nooruddin, MD   Pager:  [phone]       After Hours  (After 5pm / First Contact Pager: [phone]  weekends / holidays): Second Contact Pager: [phone]   SUBJECTIVE   Chief Complaint: PEG tube fell out  History of Present Illness: Cindy Brooks is a 53 y.o. female with PMH of prior bilateral ischemic stroke in 2022 with sequelae of dysphagia requiring PEG tube and weakness, HTN, HLD, chronic indwelling foley, and debility who presented after her PEG tube became dislodged today.  Per chart review, she had a ischemic stroke in 2023 with severe bilateral P1 and right P2 PCA stenoses, thalamic infarcts, midbrain infarctions, left proximal M2 MCA branch stenosis, right A2 ACA stenosis. At that time she required a tracheostomy and PEG tube. She was admitted several other times with sepsis from UTIs. She intermittently was able to pass swallow studies despite having a PEG tube and then eventually had her most recent G-tube placed in November of 2023 after being admitted with aspiration pneumonia.  Per the patient's daughter, the patient is non-verbal at baseline. Daughter Hassie was present at bedside and provided history.   Hassie is one of Edris Schneck daughters but is not the primary caregiver. Patient lives with daughter Keona. Today, the family noticed that the G tube had come out ~6 PM after the patient woke up from a nap. She usually gets tube feeds ~ 3x per day; last one ~2PM. She also does still eat food regularly, just not large volumes as she does not feel very hungry.  There was some leakage around the G-tube since October. She had been to her PCP  last week and at the time they thought there was some mold in the tube and patient was referred to IR for replacement. After it came out today, Keyona thought there was some blood tinged liquid around it but not purulence. There is no associated abdominal pain.  Hassie was concerned that she has a UTI because the color of her pee has been dark. She has a chronic indwelling urethral catheter. Last changed last month. Has not been able to change it as her insurance has recently changed and they are searching for a new home health agency.  Hassie states that she is only able to see her mother occasionally, as her and her sister have unresolved interpersonal conflicts that prevents her from going over to see her mother frequently. Patient communicates with Monet by whispering. With other daughters, she usually points.  Monet states that Keona manages the patients medications, and is responsible for picking them up. Hassie is concerned that her sister is overwhelmed with her mothers care and has not been seeking help. Monet was responsible for taking her mother to her most recent PCP appointment.  The patient is able to interact by nodding her head. She denies chest pain, shortness of breath, abdominal pain. No discomfort around her suprapubic region. She affirms that her stroke deficits are at her baseline. She sometimes speaks to her daughters in  whispers but does not speak most of the time.   She has had loose bowel movements last week. Last BM 11/17 ~7:30 PM, formed.  She has been undergoing treatment for a positive home UTI test with Nitrofurantoin. It is unclear whether she completed this course of antibiotics. She was asymptomatic at that time.  She has had a cough that has been present since January. Reportedly non-productive. Has not recently worsened.  ED Course: Vitals showed mild hypertension to 140/91 Labs significant for mildly decreased hemoglobin to 11.8. Creatinine 1.07 on admission, unclear  baseline, elevated at 1.11 at recent outpatient visit. Consulted IR for replacement of PEG tube  Meds:  Second doses of medications last received at [facility]. Of note, dispense history is inconsistent with routine use, but it is possible that the pharmacy dispenses are not being captured in our EMR. However, there is a recent dispense for Nitrofurantoin.  Amlodipine  10 mg daily Atorvastatin  80 mg daily Folic acid  1 mg daily Gabapentin  100 mg BID Metoprolol  50 mg BID Nitrofurantoin 100mg  twice daily for 5 days. Sertraline  50 mg daily  No outpatient medications have been marked as taking for the 08/30/24 encounter [facility] Encounter).    Past Medical History Past Medical History:  Diagnosis Date   Hypertension    Hypertensive urgency 01/20/2022   Seizure (HCC) 01/28/2022   Stroke [facility])    TIA (transient ischemic attack) 2019   Urinary tract infection 01/28/2022  HLD Hypertension [facility]    Past Surgical History Past Surgical History:  Procedure Laterality Date   ESOPHAGOGASTRODUODENOSCOPY N/A 01/28/2022   Procedure: ESOPHAGOGASTRODUODENOSCOPY (EGD);  Surgeon: Paola Dreama SAILOR, MD;  Location: [facility] ENDOSCOPY;  Service: General;  Laterality: N/A;   IR GASTROSTOMY TUBE MOD SED  08/29/2022   PEG PLACEMENT N/A 01/28/2022   Procedure: PERCUTANEOUS ENDOSCOPIC GASTROSTOMY (PEG) PLACEMENT;  Surgeon: Paola Dreama SAILOR, MD;  Location: [facility] ENDOSCOPY;  Service: General;  Laterality: N/A;    Social:  Lives With: Cindy Brooks in [place] Level of Function: Dependent for ADLs and iADls. Pivots a little with assistance. Home health agency for ADLs PCP: [facility] Substances: -Tobacco: None -Alcohol: quit 3 years ago. -Recreational Drug: None  Family History:  Family History  Problem Relation Age of Onset   Diabetes Mellitus II Maternal Grandmother     Allergies: Allergies as of 08/30/2024 - Review Complete 08/26/2024  Allergen Reaction Noted   Codeine Hives and Rash  05/29/2015   Penicillins Anaphylaxis, Swelling, and Other (See Comments) 01/20/2022   Strawberry extract Anaphylaxis and Hives 01/20/2022   Aspirin  Hives and Rash 05/29/2015    Review of Systems: A complete ROS was negative except as per HPI.   OBJECTIVE:   Physical Exam: Blood pressure (!) 140/91, pulse 80, temperature 98 F (36.7 C), resp. rate 20, SpO2 99%.  Constitutional: alert, lying in bed, in no acute distress HENT: Disconjugate gaze. Pupils sluggish. EOMI. Right sided facial droop. Resting asymmetry of the face. Limited ROM of the neck Eyes: conjunctiva non-erythematous Neck: Limited ROM of the neck.  Severe cervical kyphosis Cardiovascular: regular rate and rhythm, no m/r/g Pulmonary/Chest: normal work of breathing on room air, lungs clear to auscultation bilaterally Abdominal: bowel sounds normal, soft, non-tender, non-distended. Previous g-tube site with blood, No purulence noted. See picture below MSK: Muscle wasting. Neurological: Unable to assess orientation due to limited speaking. Does nod yes or no appropriately. Disconjugate gaze with right facial droop. 4+/5 strength in upper and lower extremities.  Skin: warm and dry Extremities: No ulcers  on bilateral feet. Severely overgrown toenails bilaterally.    Media Information   Document Information  Photos  G tube site  08/30/2024 23:59  Attached To:  Hospital Encounter on 08/30/24  Source Information  Pakou Rainbow, Melvenia, MD  [facility]     Media Information   Document Information  Photos  Removed G tube  08/31/2024 00:17  Attached To:  Hospital Encounter on 08/30/24  Source Information  Senaya Dicenso, Melvenia, MD  [facility]   Labs: CBC    Component Value Date/Time   WBC 4.5 08/30/2024 2131   RBC 4.15 08/30/2024 2131   HGB 11.8 (L) 08/30/2024 2131   HGB 12.1 10/16/2022 1147   HCT 35.3 (L) 08/30/2024 2131   HCT 37.0 10/16/2022 1147   PLT 204 08/30/2024 2131   PLT 291  10/16/2022 1147   MCV 85.1 08/30/2024 2131   MCV 89 10/16/2022 1147   MCH 28.4 08/30/2024 2131   MCHC 33.4 08/30/2024 2131   RDW 14.0 08/30/2024 2131   RDW 14.4 10/16/2022 1147   LYMPHSABS 1.7 10/16/2022 1147   MONOABS 0.8 08/28/2022 0817   EOSABS 0.1 10/16/2022 1147   BASOSABS 0.0 10/16/2022 1147     CMP     Component Value Date/Time   NA 136 08/30/2024 2131   NA 142 08/26/2024 1230   K 3.7 08/30/2024 2131   CL 101 08/30/2024 2131   CO2 26 08/30/2024 2131   GLUCOSE 84 08/30/2024 2131   BUN 16 08/30/2024 2131   BUN 20 08/26/2024 1230   CREATININE 1.07 (H) 08/30/2024 2131   CREATININE 0.86 06/14/2022 1040   CALCIUM  9.8 08/30/2024 2131   PROT 8.0 08/26/2024 1230   ALBUMIN 5.0 (H) 08/26/2024 1230   AST 21 08/26/2024 1230   AST 13 (L) 06/14/2022 1040   ALT 19 08/26/2024 1230   ALT 10 06/14/2022 1040   ALKPHOS 103 08/26/2024 1230   BILITOT 0.4 08/26/2024 1230   BILITOT 0.9 06/14/2022 1040   GFRNONAA >60 08/30/2024 2131   GFRNONAA >60 06/14/2022 1040      ASSESSMENT & PLAN:   Assessment & Plan by Problem: Principal Problem:   PEG tube malfunction (HCC)   Ronica Vivian is a 53 y.o. person living with a history of prior bilateral ischemic stroke in 2022 with sequelae of dysphagia requiring PEG tube and weakness, HTN, HLD, chronic indwelling foley, and debility who presented after her PEG tube became dislodged today, and admitted for PEG tube replacement  #PEG tube dislodgement #Dysphagia secondary to ischemic stroke #Cough PEG tube placed after ischemic stroke in 2022, most recently one was placed in November of 2023. Does not regularly follow with IR. She has tolerated dysphagia 2 diets in the past and has been eating some at home already. She does normally receive tube feeds 3 times daily. PEG tube was already ordered to be exchanged by the patient's outpatient provider. It is possible that she may need a PEG tube even if she is able to pass a swallow study given her  previous aspiration pneumonia. She does have a chronic cough that is reportedly at baseline, but we will order a chest x-ray to evaluate for an aspiration event. Previously had been taking Gabapentin  for coughing but this was discontinued due to concern for encephalopathy. Does not have any recent fills for this medication.  -IR consult placed for evaluation and management of potential PEG tube placement -SLP consult for swallow eval -Chest x-ray for chronic cough and concern for aspiration  given she is still eating regular food -NPO until procedure -Holding VTE prophylaxis for now in the setting of potential procedure.  #HTN Home meds reportedly Amlodipine  10mg  and Metoprolol  50mg  per tube. Fill history inconsistent with regular use. BP mildly elevated at admission but normalized soon after without intervention. She may not require antihypertensives in the future if her BP remains well controlled.  -Holding PO medications in the setting of aspiration risk. -Call patient's pharmacy to assess if there have been any fills that we are unable to see recently.   #History of CVA #HLD Ischemic stroke in 2022. Patient has been somewhat bed bound since then. While she does have facial asymmetry, she does have more function of her extremities than may be expected given her bed bound status. She is reportedly at her baseline. Home medications aspirin  81mg , Atorvastatin  80mg  daily.   -Holding PO medications in the setting of aspiration risk, resume after placement of PEG tube.  CKD vs AKI Creatinine 1.07 on admission. 1.11 at recent outpatient visit on 11/13. No other values since 2023 so there is an unclear baseline. It is possible that she may have an AKI in the setting of poor PO intake, but favor CKD given her two recent elevated values.  -BMP daily  Chronic indwelling catheter Has had a chronic indwelling catheter for 3 years since her stroke. Per chart review, she gets tested somewhat regularly  for UTIs by her home health agency. She has been on multiple short courses of antibiotics. It does not appear that the patient has been symptomatic from these infections, and she is likely colonized. She has no fevers, chills, suprapubic tenderness, dysuria, leukocytosis to suggest active systemic infection. Her foley was due to be exchanged in the outpatient setting. She recently completed a course of Nitrofurantoin.  -Replace foley catheter -Will defer UA in the absence of signs of clinically significant infection.  Disposition Need for medications Caregiver stress Unclear whether the patient has been taking her medications as her dispense history does not indicate regular use. Recently changed insurance and has lost previous home health agency, putting extra stress on caregivers.  -TOC consult for caregiver resources and home health resources -Call pharmacy to check for recent fills, dispense needed medications to [facility] at discharge  History of depression Home medication Sertraline  50mg  daily -Holding PO medications in the setting of aspiration risk.  Diet: NPO VTE: Holding in the setting of prior procedure Code: Full Surrogate Decision Maker: Cindy Brooks  Prior to Admission Living Arrangement: Home, living with daughters Anticipated Discharge Location: Home Barriers to Discharge: PEG tube placement. Inability to support oral intake  Dispo: Admit patient to Observation with expected length of stay less than 2 midnights.  Signed: Napoleon Limes, MD Internal Medicine Resident PGY-1 08/31/2024, 1:41 AM   Please contact IM Residency On-Call Pager at: [phone] or [phone].

## 2024-08-31 ENCOUNTER — Observation Stay (HOSPITAL_COMMUNITY)

## 2024-08-31 ENCOUNTER — Ambulatory Visit (HOSPITAL_COMMUNITY)
Admission: RE | Admit: 2024-08-31 | Discharge: 2024-08-31 | Disposition: A | Discharge status: Home / Self Care | Source: Ambulatory Visit | Attending: Family Medicine | Admitting: Family Medicine

## 2024-08-31 ENCOUNTER — Other Ambulatory Visit: Payer: Self-pay

## 2024-08-31 DIAGNOSIS — E440 Moderate protein-calorie malnutrition: Secondary | ICD-10-CM | POA: Insufficient documentation

## 2024-08-31 DIAGNOSIS — Z978 Presence of other specified devices: Secondary | ICD-10-CM

## 2024-08-31 DIAGNOSIS — N189 Chronic kidney disease, unspecified: Secondary | ICD-10-CM | POA: Insufficient documentation

## 2024-08-31 DIAGNOSIS — K9423 Gastrostomy malfunction: Principal | ICD-10-CM | POA: Diagnosis present

## 2024-08-31 HISTORY — PX: IR REPLC GASTRO/COLONIC TUBE PERCUT W/FLUORO: IMG2333

## 2024-08-31 LAB — BASIC METABOLIC PANEL WITH GFR
Anion gap: 9 (ref 5–15)
BUN: 14 mg/dL (ref 6–20)
CO2: 25 mmol/L (ref 22–32)
Calcium: 9.4 mg/dL (ref 8.9–10.3)
Chloride: 105 mmol/L (ref 98–111)
Creatinine, Ser: 1.02 mg/dL — ABNORMAL HIGH (ref 0.44–1.00)
GFR, Estimated: >60 mL/min (ref >60)
Glucose, Bld: 73 mg/dL (ref 70–99)
Potassium: 3.7 mmol/L (ref 3.5–5.1)
Sodium: 139 mmol/L (ref 135–145)

## 2024-08-31 LAB — MAGNESIUM: Magnesium: 1.9 mg/dL (ref 1.7–2.4)

## 2024-08-31 LAB — CBC
HCT: 31.1 % — ABNORMAL LOW (ref 36.0–46.0)
Hemoglobin: 10.5 g/dL — ABNORMAL LOW (ref 12.0–15.0)
MCH: 28.5 pg (ref 26.0–34.0)
MCHC: 33.8 g/dL (ref 30.0–36.0)
MCV: 84.3 fL (ref 80.0–100.0)
Platelets: 201 K/uL (ref 150–400)
RBC: 3.69 MIL/uL — ABNORMAL LOW (ref 3.87–5.11)
RDW: 13.9 % (ref 11.5–15.5)
WBC: 5.2 K/uL (ref 4.0–10.5)
nRBC: 0 % (ref 0.0–0.2)

## 2024-08-31 LAB — GLUCOSE, CAPILLARY
Glucose-Capillary: 136 mg/dL — ABNORMAL HIGH (ref 70–99)
Glucose-Capillary: 54 mg/dL — ABNORMAL LOW (ref 70–99)
Glucose-Capillary: 55 mg/dL — ABNORMAL LOW (ref 70–99)
Glucose-Capillary: 106 mg/dL — ABNORMAL HIGH (ref 70–99)

## 2024-08-31 LAB — PHOSPHORUS: Phosphorus: 2.6 mg/dL (ref 2.5–4.6)

## 2024-08-31 MED ORDER — DEXTROSE 50 % IV SOLN
12.5000 g | INTRAVENOUS | Status: AC
  Administered 2024-08-31: 12.5 g via INTRAVENOUS
  Filled 2024-08-31: qty 50

## 2024-08-31 MED ORDER — CARMEX CLASSIC LIP BALM EX OINT
TOPICAL_OINTMENT | CUTANEOUS | Status: DC | PRN
  Filled 2024-08-31: qty 10

## 2024-08-31 MED ORDER — GABAPENTIN 100 MG PO CAPS
100.0000 mg | ORAL_CAPSULE | Freq: Two times a day (BID) | ORAL | Status: DC
  Administered 2024-08-31 – 2024-09-06 (×12): 100 mg via ORAL
  Filled 2024-08-31 (×12): qty 1

## 2024-08-31 MED ORDER — ENOXAPARIN SODIUM 300 MG/3ML IJ SOLN
20.0000 mg | INTRAMUSCULAR | Status: DC
  Administered 2024-08-31 – 2024-09-05 (×6): 20 mg via SUBCUTANEOUS
  Filled 2024-08-31 (×7): qty 0.2

## 2024-08-31 MED ORDER — LIDOCAINE VISCOUS HCL 2 % MT SOLN
OROMUCOSAL | Status: AC
  Filled 2024-08-31: qty 15

## 2024-08-31 MED ORDER — LACTATED RINGERS IV BOLUS
1000.0000 mL | Freq: Once | INTRAVENOUS | Status: AC
  Administered 2024-08-31: 1000 mL via INTRAVENOUS

## 2024-08-31 MED ORDER — ATORVASTATIN CALCIUM 80 MG PO TABS
80.0000 mg | ORAL_TABLET | Freq: Every day | ORAL | Status: DC
  Administered 2024-08-31 – 2024-09-06 (×7): 80 mg via ORAL
  Filled 2024-08-31 (×7): qty 1

## 2024-08-31 MED ORDER — ENOXAPARIN SODIUM 40 MG/0.4ML IJ SOSY: 40.0000 mg | PREFILLED_SYRINGE | INTRAMUSCULAR | Status: DC

## 2024-08-31 MED ORDER — IOHEXOL 300 MG/ML  SOLN
50.0000 mL | Freq: Once | INTRAMUSCULAR | Status: AC | PRN
  Administered 2024-08-31: 20 mL

## 2024-08-31 MED ORDER — FREE WATER
60.0000 mL | Status: DC
  Administered 2024-08-31 – 2024-09-02 (×9): 60 mL

## 2024-08-31 MED ORDER — LIDOCAINE VISCOUS HCL 2 % MT SOLN
OROMUCOSAL | Status: AC
Start: 2024-08-31 — End: 2024-08-31
  Filled 2024-08-31: qty 15

## 2024-08-31 MED ORDER — METOPROLOL TARTRATE 25 MG PO TABS
50.0000 mg | ORAL_TABLET | Freq: Two times a day (BID) | ORAL | Status: DC
  Administered 2024-08-31 – 2024-09-03 (×5): 50 mg via ORAL
  Filled 2024-08-31 (×5): qty 2

## 2024-08-31 MED ORDER — AMLODIPINE BESYLATE 10 MG PO TABS
10.0000 mg | ORAL_TABLET | Freq: Every day | ORAL | Status: DC
  Administered 2024-08-31 – 2024-09-03 (×4): 10 mg via ORAL
  Filled 2024-08-31 (×4): qty 1

## 2024-08-31 MED ORDER — ADULT MULTIVITAMIN W/MINERALS CH
1.0000 | ORAL_TABLET | Freq: Every day | ORAL | Status: DC
  Administered 2024-08-31 – 2024-09-06 (×7): 1 via ORAL
  Filled 2024-08-31 (×7): qty 1

## 2024-08-31 MED ORDER — IPRATROPIUM-ALBUTEROL 0.5-2.5 (3) MG/3ML IN SOLN: 3.0000 mL | Freq: Four times a day (QID) | RESPIRATORY_TRACT | Status: DC | PRN

## 2024-08-31 MED ORDER — CHLORHEXIDINE GLUCONATE CLOTH 2 % EX PADS
6.0000 | MEDICATED_PAD | Freq: Every day | CUTANEOUS | Status: DC
  Administered 2024-08-31 – 2024-09-06 (×7): 6 via TOPICAL

## 2024-08-31 MED ORDER — DEXTROSE-SODIUM CHLORIDE 5-0.45 % IV SOLN: INTRAVENOUS | Status: AC

## 2024-08-31 MED ORDER — OSMOLITE 1.5 CAL PO LIQD
237.0000 mL | Freq: Three times a day (TID) | ORAL | Status: DC
  Administered 2024-08-31 – 2024-09-02 (×5): 237 mL
  Filled 2024-08-31 (×7): qty 237

## 2024-08-31 MED ORDER — ASPIRIN 81 MG PO CHEW
81.0000 mg | CHEWABLE_TABLET | Freq: Every day | ORAL | Status: DC
  Administered 2024-08-31 – 2024-09-06 (×7): 81 mg via ORAL
  Filled 2024-08-31 (×7): qty 1

## 2024-08-31 MED ORDER — THIAMINE MONONITRATE 100 MG PO TABS
100.0000 mg | ORAL_TABLET | Freq: Every day | ORAL | Status: AC
Start: 2024-08-31 — End: 2024-09-06
  Administered 2024-08-31 – 2024-09-06 (×7): 100 mg via ORAL
  Filled 2024-08-31 (×7): qty 1

## 2024-08-31 MED ORDER — ENSURE PLUS HIGH PROTEIN PO LIQD
237.0000 mL | Freq: Two times a day (BID) | ORAL | Status: DC
  Administered 2024-08-31: 237 mL

## 2024-08-31 NOTE — Progress Notes (Signed)
 Subjective:   Summary: Cindy Brooks is a 54 y.o. year old female currently admitted on the IMTS HD#0 for PEG tube dislodgement and chronic deconditioning.  Overnight Events: NA  Pt seen bedside this PM, with daughter present as well. Daughter states that pt lives with a different daughter, and has been more difficult to take care of her mother.   Objective:  Vital signs in last 24 hours: Vitals:   08/31/24 0118 08/31/24 0537 08/31/24 1100 08/31/24 1300  BP:  110/82 98/80   Pulse:  71 81   Resp:  18 18   Temp: 98.5 F (36.9 C)  98.8 F (37.1 C)   TempSrc: Oral  Oral   SpO2:  99% 100%   Weight:    34.4 kg   Supplemental O2: Room Air SpO2: 100 %   Physical Exam:  Constitutional: Elderly female,  alert, in no acute distress Cardiovascular: RRR, no murmurs, rubs or gallops Pulmonary/Chest: normal work of breathing on room air, lungs clear to auscultation bilaterally Abdominal: soft, non-tender, non-distended Skin: Decreased skin turgor, PEG tube site clean with no surrounding erythema   Filed Weights   08/31/24 1300  Weight: 34.4 kg    No intake or output data in the 24 hours ending 08/31/24 1446 Net IO Since Admission: No IO data has been entered for this period [08/31/24 1446]  Pertinent Labs:    Latest Ref Rng & Units 08/31/2024    5:37 AM 08/30/2024    9:31 PM 10/16/2022   11:47 AM  CBC  WBC 4.0 - 10.5 K/uL 5.2  4.5  5.8   Hemoglobin 12.0 - 15.0 g/dL 89.4  88.1  87.8   Hematocrit 36.0 - 46.0 % 31.1  35.3  37.0   Platelets 150 - 400 K/uL 201  204  291        Latest Ref Rng & Units 08/31/2024    5:37 AM 08/30/2024    9:31 PM 08/26/2024   12:30 PM  CMP  Glucose 70 - 99 mg/dL 73  84  877   BUN 6 - 20 mg/dL 14  16  20    Creatinine 0.44 - 1.00 mg/dL 8.97  8.92  8.88   Sodium 135 - 145 mmol/L 139  136  142   Potassium 3.5 - 5.1 mmol/L 3.7  3.7  4.6   Chloride 98 - 111 mmol/L 105  101  99   CO2 22 - 32 mmol/L 25  26  23    Calcium  8.9 -  10.3 mg/dL 9.4  9.8  89.6   Total Protein 6.0 - 8.5 g/dL   8.0   Total Bilirubin 0.0 - 1.2 mg/dL   0.4   Alkaline Phos 49 - 135 IU/L   103   AST 0 - 40 IU/L   21   ALT 0 - 32 IU/L   19      Imaging: IR REPLACE GASTRO/JEJUNO TUBE PERC W/FLUORO Result Date: 08/31/2024 INDICATION: Enteric feeding tube accidentally removed EXAM: FLUOROSCOPIC REPLACEMENT OF THE BALLOON RETENTION GASTROSTOMY MEDICATIONS: NONE. ANESTHESIA/SEDATION: None. CONTRAST:  20 cc-administered into the gastric lumen. FLUOROSCOPY: Radiation Exposure Index (as provided by the fluoroscopic device): 8.0 mGy Kerma COMPLICATIONS: None immediate. PROCEDURE: Informed written consent was obtained from the patient's family after a thorough discussion of the procedural risks, benefits and alternatives. All questions were addressed. Maximal Sterile Barrier Technique was utilized including caps, mask, sterile gowns, sterile gloves,  sterile drape, hand hygiene and skin antiseptic. A timeout was performed prior to the initiation of the procedure. Previous imaging reviewed. Percutaneous tract was cannulated with a Kumpe catheter. Contrast injection confirms position in the stomach. Over guidewire, a new 12 French balloon retention gastrostomy was advanced. Retention balloon inflated with the appropriate volume containing contrast for visualization. This was retracted against the stomach wall. Contrast injection confirms position. Access ready for use. Catheter secured externally. No immediate complication. Patient tolerated the procedure well. IMPRESSION: Successful fluoroscopic replacement of the 12 French balloon retention gastrostomy Electronically Signed   By: CHRISTELLA.  Shick M.D.   On: 08/31/2024 13:15   Portable chest 1 View Result Date: 08/31/2024 EXAM: 1 VIEW(S) XRAY OF THE CHEST 08/31/2024 03:46:15 AM COMPARISON: 08/25/2022 CLINICAL HISTORY: Cough FINDINGS: LUNGS AND PLEURA: Question left retrocardiac airspace opacity versus patient rotation  obscuring the left costophrenic angle. Small left pleural effusion. No pneumothorax. HEART AND MEDIASTINUM: No acute abnormality of the cardiac and mediastinal silhouettes. BONES AND SOFT TISSUES: No acute osseous abnormality. IMPRESSION: 1. Small left pleural effusion. 2. Questionable left retrocardiac airspace opacity versus obscuration from patient rotation. Consider further evaluation with dedicated PA and lateral chest radiograph. Electronically signed by: Waddell Calk MD 08/31/2024 05:14 AM EST RP Workstation: [id number]   Assessment/Plan:   Principal Problem:   PEG tube malfunction (HCC) Active Problems:   Acute bilateral thalamic, midbrain and bilateral cerebral ischemic CVA   HTN (hypertension)   CKD (chronic kidney disease)   Chronic indwelling Foley catheter   Patient Summary: Cindy Brooks is a 53 y.o. person living with a history of prior bilateral ischemic stroke in 2022 with sequelae of dysphagia requiring PEG tube and weakness, HTN, HLD, chronic indwelling foley, and debility who presented after her PEG tube became dislodged today, and admitted for PEG tube replacement    #PEG Tube Dislodgement #Dysphagia 2/2 Ischemic Stroke PEG tube replaced today by IR, per IR it is ready for sure. Pending RD consult for initiation of tube feeds.  #Chronic Conditioning  Discussed with daughter in the room, appears she has been having a decline in functional status for quite some time now. Her daughter feels like her appetite is not up to par as it was before, and she has become progressively weaker. She has been able to tolerate solids recently, as well as swallow her pills. Her daughter states that she has big pill bottles at home with her medications. She would benefit greatly from physical therapy, occupational therapy, and speech therapy evaluation while she is here. She is able to take PO medications and have solids reportedly, so will restart her PO medications as well.   Plan:  -  PT/OT/SLP  - TOC to talk with family to discuss home living arrangements and what help they need - D5 NaCl 75cc/hr for 12 hours  #Chronic Indwelling Catheter  Exchanged today, no suprapubic pain or signs of infection  #HTN Continuing her home medications of amlodipine  10mg  and metoprolol   #Hx of CVA  #HLD  Continuing statin and aspirin    #Hx of Depression  Restarting sertraline   #Elevated Creatinine  Monitoring   Diet: Normal IVF: D5,75cc/hr VTE: Enoxaparin  Code: Full PT/OT recs: Pending, Pending  TOC recs: Pending Family Update: Bedside   Dispo: Anticipated discharge to Rehab in 2 days pending medical management.   Tyion Boylen, MD PGY-3 Internal Medicine Resident Pager Number [phone] Please contact the on call pager after 5 pm and on weekends at [phone].

## 2024-08-31 NOTE — Telephone Encounter (Signed)
 noted

## 2024-08-31 NOTE — TOC CM/SW Note (Signed)
 Transition of Care [facility]) - Inpatient Brief Assessment   Patient Details  Name: Cindy Brooks MRN: [id number] Date of Birth: April 24, 1971  [facility]) CM/SW Contact:    Lauraine FORBES Saa, LCSWA Phone Number: 08/31/2024, 10:10 AM   Clinical Narrative:  10:10 AM Per chart review, patient resides at home. Patient has a PCP and insurance. Patient does not have SNF history. Patient has HH history with [facility]. Patient has DME (tube feeding, BSC, nebulizer machine, manual wheelchair, hospital bed, walker, recliner) history. Patient's preferred pharmacy's are [facility] and [facility] [address]. TOC consult was placed for HH/DME needs. TOC will continue to follow.  Transition of Care Asessment: Insurance and Status: Insurance coverage has been reviewed Patient has primary care physician: Yes Home environment has been reviewed: Private Residence Prior level of function:: N/A Prior/Current Home Services: No current home services Social Drivers of Health Review: SDOH reviewed no interventions necessary Readmission risk has been reviewed: Yes (Currently Observation Status) Transition of care needs: transition of care needs identified, TOC will continue to follow

## 2024-08-31 NOTE — Progress Notes (Signed)
 Initial Nutrition Assessment  DOCUMENTATION CODES:  Non-severe (moderate) malnutrition in context of chronic illness, Underweight  INTERVENTION:  Tube feeds via PEG tube: 1 carton Osmolite 1.5 (237 mL) - TID Start with 1/2 a carton x 2 feeds, increase to 1 full carton at third feed if tolerated Administer slowly over 15-20 minutes via gravity Free water  flush: 30 mL before and after each bolus feed + 60 mL between feeds Provides 1067 kcal (meets 76% estimated energy needs), 45 gm protein (meets 64% of estimated protein needs), and 903 mL free water  daily.  Monitor magnesium , potassium, and phosphorus daily for at least 3 days, MD to replete as needed, as pt is at risk for refeeding syndrome given malnutrition and unknown nutritional intake prior to admission. Thiamine  100 mg daily for 7 days Multivitamin w/ minerals daily   NUTRITION DIAGNOSIS:  Moderate Malnutrition related to chronic illness as evidenced by moderate fat depletion, moderate muscle depletion.  GOAL:  Patient will meet greater than or equal to 90% of their needs  MONITOR:  PO intake, TF tolerance, Weight trends, Labs  REASON FOR ASSESSMENT:  Consult Assessment of nutrition requirement/status, Enteral/tube feeding initiation and management  ASSESSMENT:  53 y.o. female presented to the ED after her PEG tube became dislodged. PMH includes CVA, dysphagia s/p PEG, and HTN. Pt admitted for PEG tube malfunction/dislodgement.   11/17 - Admitted   11/18 - IR replaced G-tube (12 Fr.)  RD visited with patient, pt unable to provide history due to non-verbal. Daughter at bedside but sleeping and did not wake to RD voice. RD attempted third time to see daughter and no family present at bedside. Discussed with resident that pt receives Ensure per the tube at home. Per residents note, pt was able to tolerate some POs at home. Without being able to speak with family, unsure how much nutrition pt was receiving orally or enterally per  tube.   Discussed nutrition plan with resident, resident ok with RD ordering tube feeds while in the hospital to be administered per tube.  No weight this admission, RD asked RN to obtain. Per chart review, no weight history recorded within the past year to assess for weight loss. Although, in the past two years pt weight is down 17.7 kg or 33.9%. Noted in H&P that pt is primarily bedbound at baseline.   Nutrition Related Medications: D5 with NaCl at 75 mL/hr Labs: Sodium 139, Potassium 3.7, BUN 14, Creatinine 1.02 CBG: 54-136 mg/dL  NUTRITION - FOCUSED PHYSICAL EXAM: Flowsheet Row Most Recent Value  Orbital Region Moderate depletion  Upper Arm Region Moderate depletion  Thoracic and Lumbar Region Unable to assess  Buccal Region Moderate depletion  Temple Region Moderate depletion  Clavicle Bone Region Moderate depletion  Clavicle and Acromion Bone Region Moderate depletion  Scapular Bone Region Unable to assess  Dorsal Hand Severe depletion  Patellar Region Severe depletion  Anterior Thigh Region Severe depletion  Posterior Calf Region Severe depletion  Edema (RD Assessment) None  Hair Reviewed  Eyes Reviewed  Mouth Unable to assess  Skin Reviewed  Nails Reviewed   Diet Order:   Diet Order             Diet regular Room service appropriate? Yes; Fluid consistency: Thin  Diet effective now                  EDUCATION NEEDS:  Not appropriate for education at this time  Skin:  Skin Assessment: Reviewed RN Assessment  Last BM:  Unknown  Height:  Ht Readings from Last 1 Encounters:  08/31/24 5' 4 (1.626 m)   Weight:  Wt Readings from Last 1 Encounters:  08/31/24 34.4 kg   Ideal Body Weight:  54.6 kg  BMI:  Body mass index is 13.02 kg/m.  Estimated Nutritional Needs:  Kcal:  1400-1600 Protein:  70-90 grams Fluid:  >/= 1.5 L   Nestora Glatter RD, LDN Registered Dietitian I Please see AMION for contact information

## 2024-08-31 NOTE — Progress Notes (Signed)
 SLP Cancellation Note  Patient Details Name: Cindy Brooks MRN: [id number] DOB: 1971/04/08   Cancelled treatment:       Reason Eval/Treat Not Completed: Patient not medically ready. Messaged MD to confirm readiness for swallow assessment. Unclear if pt finished with IR procedures. Will f/u tomorrow.    Raylea Adcox, [name] 08/31/2024, 1:42 PM

## 2024-08-31 NOTE — Telephone Encounter (Signed)
 Referral received for home health nursing. Patient currently hospitalized, will monitor if home health referral is placed during hospitalization

## 2024-08-31 NOTE — Plan of Care (Signed)

## 2024-09-01 ENCOUNTER — Observation Stay (HOSPITAL_COMMUNITY)

## 2024-09-01 DIAGNOSIS — K9423 Gastrostomy malfunction: Secondary | ICD-10-CM | POA: Diagnosis not present

## 2024-09-01 DIAGNOSIS — R5381 Other malaise: Secondary | ICD-10-CM | POA: Diagnosis not present

## 2024-09-01 DIAGNOSIS — L8990 Pressure ulcer of unspecified site, unspecified stage: Secondary | ICD-10-CM | POA: Insufficient documentation

## 2024-09-01 DIAGNOSIS — Z8673 Personal history of transient ischemic attack (TIA), and cerebral infarction without residual deficits: Secondary | ICD-10-CM | POA: Diagnosis not present

## 2024-09-01 DIAGNOSIS — E43 Unspecified severe protein-calorie malnutrition: Secondary | ICD-10-CM

## 2024-09-01 LAB — RENAL FUNCTION PANEL
Albumin: 3.5 g/dL (ref 3.5–5.0)
Anion gap: 11 (ref 5–15)
BUN: 11 mg/dL (ref 6–20)
CO2: 24 mmol/L (ref 22–32)
Calcium: 9.7 mg/dL (ref 8.9–10.3)
Chloride: 104 mmol/L (ref 98–111)
Creatinine, Ser: 1.08 mg/dL — ABNORMAL HIGH (ref 0.44–1.00)
GFR, Estimated: >60 mL/min (ref >60)
Glucose, Bld: 92 mg/dL (ref 70–99)
Phosphorus: 3.3 mg/dL (ref 2.5–4.6)
Potassium: 4.2 mmol/L (ref 3.5–5.1)
Sodium: 139 mmol/L (ref 135–145)

## 2024-09-01 LAB — GLUCOSE, CAPILLARY
Glucose-Capillary: 108 mg/dL — ABNORMAL HIGH (ref 70–99)
Glucose-Capillary: 130 mg/dL — ABNORMAL HIGH (ref 70–99)
Glucose-Capillary: 179 mg/dL — ABNORMAL HIGH (ref 70–99)
Glucose-Capillary: 93 mg/dL (ref 70–99)

## 2024-09-01 LAB — CBC
HCT: 33.6 % — ABNORMAL LOW (ref 36.0–46.0)
Hemoglobin: 11.2 g/dL — ABNORMAL LOW (ref 12.0–15.0)
MCH: 28.4 pg (ref 26.0–34.0)
MCHC: 33.3 g/dL (ref 30.0–36.0)
MCV: 85.1 fL (ref 80.0–100.0)
Platelets: 200 K/uL (ref 150–400)
RBC: 3.95 MIL/uL (ref 3.87–5.11)
RDW: 13.9 % (ref 11.5–15.5)
WBC: 5.3 K/uL (ref 4.0–10.5)
nRBC: 0 % (ref 0.0–0.2)

## 2024-09-01 LAB — PHOSPHORUS: Phosphorus: 3.1 mg/dL (ref 2.5–4.6)

## 2024-09-01 LAB — MISC LABCORP TEST (SEND OUT): Labcorp test code: 83935

## 2024-09-01 LAB — MAGNESIUM: Magnesium: 2 mg/dL (ref 1.7–2.4)

## 2024-09-01 MED ORDER — LACTATED RINGERS IV BOLUS
500.0000 mL | Freq: Once | INTRAVENOUS | Status: AC
  Administered 2024-09-01: 500 mL via INTRAVENOUS

## 2024-09-01 NOTE — NC FL2 (Signed)
 [place]  MEDICAID FL2 LEVEL OF CARE FORM     IDENTIFICATION  Patient Name: Cindy Brooks Birthdate: 1971/09/06 Sex: female Admission Date (Current Location): 08/30/2024  [facility] and [place] Number:  Producer, Television/film/video and Address:  [facility] [place], [address], [place], [place] [address]      Provider Number: [id number]  Attending Physician Name and Address:  Lovie Clarity, MD  Relative Name and Phone Number:  Madia Carvell; Daughter; [phone] and Tashiana Lamarca; Daughter; [phone]    Current Level of Care: Hospital Recommended Level of Care: Skilled Nursing Facility Prior Approval Number:    Date Approved/Denied:   PASRR Number: [id number]  Discharge Plan: SNF    Current Diagnoses: Patient Active Problem List   Diagnosis Date Noted   Physical deconditioning 09/01/2024   Severe protein-calorie malnutrition 09/01/2024   Pressure injury of skin 09/01/2024   PEG tube malfunction (HCC) 08/31/2024   CKD (chronic kidney disease) 08/31/2024   Chronic indwelling Foley catheter 08/31/2024   Malnutrition of moderate degree 08/31/2024   Hypomagnesemia 08/20/2022   Mucus plug in respiratory tract 08/20/2022   Dysphagia 08/20/2022   Community acquired bacterial pneumonia 08/15/2022   Sepsis (HCC) 05/25/2022   ARF (acute renal failure) 05/25/2022   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) 05/14/2022   Catheter-associated urinary tract infection 04/20/2022   Sepsis secondary to UTI (HCC) 04/19/2022   Acute encephalopathy 03/24/2022   Tachycardia    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   Dysphagia as late effect of stroke 03/20/2022   Acute urinary retention 03/20/2022   Normocytic anemia 03/20/2022   CVA (cerebral vascular accident) (HCC) 03/20/2022   Hypernatremia 02/07/2022   Coma (HCC) 02/07/2022   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   Pressure ulcer 02/01/2022   PEG  status (HCC) 01/29/2022   UTI  (urinary tract infection) 01/28/2022   HTN (hypertension)    AKI (acute kidney injury)    Acute bilateral thalamic, midbrain and bilateral cerebral ischemic CVA 01/22/2022   Acute metabolic encephalopathy 01/20/2022    Orientation RESPIRATION BLADDER Height & Weight     Self  Normal (Room Air) Incontinent, Indwelling catheter Weight: 85 lb 5.1 oz (38.7 kg) Height:  5' 4 (162.6 cm)  BEHAVIORAL SYMPTOMS/MOOD NEUROLOGICAL BOWEL NUTRITION STATUS    Convulsions/Seizures (History of seizures) Incontinent (Gastrostomy/Enterostomy Gastrostomy 12 Fr. LUQ) Diet (Please see discharge summary)  AMBULATORY STATUS COMMUNICATION OF NEEDS Skin   Extensive Assist Non-Verbally PU Stage and Appropriate Care (Pressure Injury Sacrum Medial Stage 1 - Intact skin with non-blanchable redness of a localized area usually over a bony prominence.)                       Personal Care Assistance Level of Assistance  Bathing, Feeding, Dressing Bathing Assistance: Maximum assistance Feeding assistance: Maximum assistance Dressing Assistance: Maximum assistance     Functional Limitations Info  Sight Sight Info: Impaired (L)        SPECIAL CARE FACTORS FREQUENCY  PT (By licensed PT), OT (By licensed OT), Speech therapy     PT Frequency: 5x OT Frequency: 5x     Speech Therapy Frequency: 3x      Contractures Contractures Info: Not present    Additional Factors Info  Code Status, Allergies Code Status Info: Full Code Allergies Info: Codeine; Penicillins; Aspirin ; Starberry Extract           Current Medications (09/01/2024):  This is the current hospital active medication list Current  Facility-Administered Medications  Medication Dose Route Frequency Provider Last Rate Last Admin   amLODipine  (NORVASC ) tablet 10 mg  10 mg Oral Daily Myrna, Olivia, DO   10 mg at 09/01/24 9077   aspirin  chewable tablet 81 mg  81 mg Oral Daily King, Olivia, DO   81 mg at 09/01/24 9077   atorvastatin  (LIPITOR )  tablet 80 mg  80 mg Oral Daily Nooruddin, Saad, MD   80 mg at 09/01/24 9077   Chlorhexidine  Gluconate Cloth 2 % PADS 6 each  6 each Topical Daily Gomez-Caraballo, Maria, MD   6 each at 09/01/24 9077   enoxaparin  (LOVENOX ) 100 mg/mL injection 20 mg  20 mg Subcutaneous Q24H Reome, Earle J, RPH   20 mg at 08/31/24 1610   feeding supplement (OSMOLITE 1.5 CAL) liquid 237 mL  237 mL Per Tube TID Lovie Clarity, MD   237 mL at 09/01/24 0923   free water  60 mL  60 mL Per Tube Q4H Lovie Clarity, MD   60 mL at 09/01/24 1414   gabapentin  (NEURONTIN ) capsule 100 mg  100 mg Oral BID Nooruddin, Saad, MD   100 mg at 09/01/24 9077   ipratropium-albuterol  (DUONEB) 0.5-2.5 (3) MG/3ML nebulizer solution 3 mL  3 mL Nebulization Q6H PRN Nooruddin, Saad, MD       lip balm (CARMEX) ointment   Topical PRN Machen, Julie, MD       metoprolol  tartrate (LOPRESSOR ) tablet 50 mg  50 mg Oral BID Nooruddin, Saad, MD   50 mg at 09/01/24 9077   multivitamin with minerals tablet 1 tablet  1 tablet Oral Daily Machen, Julie, MD   1 tablet at 09/01/24 9077   thiamine  (VITAMIN B1) tablet 100 mg  100 mg Oral Daily Machen, Julie, MD   100 mg at 09/01/24 9077     Discharge Medications: Please see discharge summary for a list of discharge medications.  Relevant Imaging Results:  Relevant Lab Results:   Additional Information SS#[id number]  Lauraine FORBES Saa, LCSWA

## 2024-09-01 NOTE — Plan of Care (Signed)
 Patient physically aggressive at around 0500, refusing vital signs, care, of blood glucose checks. Unable to verbalize with staff. Daughter at bedside tried to calm patient down. Glucose and vital signs were not obtained but patient was able to allow for her to be cleansed and bed sheets changed.  Problem: Education: Goal: Knowledge of General Education information will improve Description: Including pain rating scale, medication(s)/side effects and non-pharmacologic comfort measures Outcome: Adequate for Discharge   Problem: Health Behavior/Discharge Planning: Goal: Ability to manage health-related needs will improve Outcome: Adequate for Discharge   Problem: Clinical Measurements: Goal: Ability to maintain clinical measurements within normal limits will improve Outcome: Adequate for Discharge Goal: Will remain free from infection Outcome: Adequate for Discharge Goal: Diagnostic test results will improve Outcome: Adequate for Discharge Goal: Respiratory complications will improve Outcome: Adequate for Discharge Goal: Cardiovascular complication will be avoided Outcome: Adequate for Discharge   Problem: Activity: Goal: Risk for activity intolerance will decrease Outcome: Adequate for Discharge   Problem: Nutrition: Goal: Adequate nutrition will be maintained Outcome: Adequate for Discharge   Problem: Coping: Goal: Level of anxiety will decrease Outcome: Adequate for Discharge   Problem: Elimination: Goal: Will not experience complications related to bowel motility Outcome: Adequate for Discharge Goal: Will not experience complications related to urinary retention Outcome: Adequate for Discharge   Problem: Pain Managment: Goal: General experience of comfort will improve and/or be controlled Outcome: Adequate for Discharge   Problem: Safety: Goal: Ability to remain free from injury will improve Outcome: Adequate for Discharge   Problem: Skin Integrity: Goal: Risk for  impaired skin integrity will decrease Outcome: Adequate for Discharge

## 2024-09-01 NOTE — TOC Initial Note (Signed)
 Transition of Care [facility]) - Initial/Assessment Note    Patient Details  Name: Cindy Brooks MRN: [id number] Date of Birth: [birth year]  [facility]) CM/SW Contact:    Lauraine FORBES Saa, LCSWA Phone Number: 09/01/2024, 4:18 PM  Clinical Narrative:                  4:18 PM OT informed CSW and medical team of therapy recommendation of patient discharging to SNF, as well as patient and patient's daughter's agreeance with patient discharging to SNF. CSW sent patient's FL2 to SNFs in [facility]. CSW will continue to follow.  Expected Discharge Plan: Skilled Nursing Facility Barriers to Discharge: Continued Medical Work up, English As A Second Language Teacher, SNF Pending bed offer   Patient Goals and CMS Choice            Expected Discharge Plan and Services In-house Referral: Clinical Social Work   Post Acute Care Choice: Skilled Nursing Facility Living arrangements for the past 2 months: Single Family Home                                      Prior Living Arrangements/Services Living arrangements for the past 2 months: Single Family Home Lives with:: Adult Children Patient language and need for interpreter reviewed:: Yes        Need for Family Participation in Patient Care: Yes (Comment) Care giver support system in place?: Yes (comment) Current home services: DME Criminal Activity/Legal Involvement Pertinent to Current Situation/Hospitalization: No - Comment as needed  Activities of Daily Living   ADL Screening (condition at time of admission) Independently performs ADLs?: No Does the patient have a NEW difficulty with bathing/dressing/toileting/self-feeding that is expected to last >3 days?: Yes (Initiates electronic notice to provider for possible OT consult) Does the patient have a NEW difficulty with getting in/out of bed, walking, or climbing stairs that is expected to last >3 days?: Yes (Initiates electronic notice to provider for possible PT  consult) Does the patient have a NEW difficulty with communication that is expected to last >3 days?: Yes (Initiates electronic notice to provider for possible SLP consult) Is the patient deaf or have difficulty hearing?: No Does the patient have difficulty seeing, even when wearing glasses/contacts?: Yes Does the patient have difficulty concentrating, remembering, or making decisions?: Yes  Permission Sought/Granted Permission sought to share information with : Family Supports Permission granted to share information with : No (Contact information on chart)  Share Information with NAME: Alverta Caccamo  Permission granted to share info w AGENCY: SNF  Permission granted to share info w Relationship: Daughter  Permission granted to share info w Contact Information: [phone]  Emotional Assessment       Orientation: : Oriented to Self Alcohol / Substance Use: Not Applicable Psych Involvement: No (comment)  Admission diagnosis:  PEG tube malfunction (HCC) [K94.23] Patient Active Problem List   Diagnosis Date Noted   Physical deconditioning 09/01/2024   Severe protein-calorie malnutrition 09/01/2024   Pressure injury of skin 09/01/2024   PEG tube malfunction (HCC) 08/31/2024   CKD (chronic kidney disease) 08/31/2024   Chronic indwelling Foley catheter 08/31/2024   Malnutrition of moderate degree 08/31/2024   Hypomagnesemia 08/20/2022   Mucus plug in respiratory tract 08/20/2022   Dysphagia 08/20/2022   Community acquired bacterial pneumonia 08/15/2022   Sepsis (HCC) 05/25/2022   ARF (acute renal failure) 05/25/2022   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) 05/14/2022  Catheter-associated urinary tract infection 04/20/2022   Sepsis secondary to UTI (HCC) 04/19/2022   Acute encephalopathy 03/24/2022   Tachycardia    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   Dysphagia as late effect of stroke 03/20/2022   Acute urinary retention 03/20/2022   Normocytic anemia 03/20/2022   CVA  (cerebral vascular accident) (HCC) 03/20/2022   Hypernatremia 02/07/2022   Coma (HCC) 02/07/2022   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 [place] [address] Phone: [phone] Fax: [phone]     Social Drivers of Health (SDOH) Social History: SDOH Screenings   Food Insecurity: No Food Insecurity (08/31/2024)  Housing: Low Risk  (08/31/2024)  Transportation Needs: No Transportation Needs (08/31/2024)  Utilities: Not At Risk (08/31/2024)  Depression (PHQ2-9): High Risk (05/14/2022)  Tobacco Use: Low Risk  (08/26/2024)   SDOH Interventions:     Readmission Risk Interventions    09/02/2022    2:23 PM 05/28/2022    2:27 PM 03/13/2022    9:03 AM  Readmission Risk Prevention Plan  Transportation Screening Complete Complete Complete  PCP or Specialist Appt within 5-7 Days   Complete  PCP or Specialist Appt within 3-5 Days  Complete   Home Care Screening   Complete  Medication Review (RN CM)   Complete  HRI or Home Care Consult  Complete   Social Work Consult for Recovery Care Planning/Counseling  Complete   Palliative Care Screening  Not Applicable   Medication Review Oceanographer) Complete Complete   PCP or Specialist appointment within 3-5 days of discharge Complete    HRI or Home Care Consult Complete    SW Recovery Care/Counseling Consult Complete    Palliative Care Screening Complete    Skilled Nursing Facility  Not Applicable

## 2024-09-01 NOTE — Progress Notes (Signed)
 Patient's blood pressure 86/67 at this time. Provider made aware.

## 2024-09-01 NOTE — Evaluation (Signed)
 Clinical/Bedside Swallow Evaluation Patient Details  Name: Cindy Brooks MRN: [id number] Date of Birth: 01/11/1971  Today's Date: 09/01/2024 Time: SLP Start Time (ACUTE ONLY): 0945 SLP Stop Time (ACUTE ONLY): 1047 SLP Time Calculation (min) (ACUTE ONLY): 62 min  Past Medical History:  Past Medical History:  Diagnosis Date   Hypertension    Hypertensive urgency 01/20/2022   Seizure (HCC) 01/28/2022   Stroke (HCC)    TIA (transient ischemic attack) 2019   Urinary tract infection 01/28/2022   Past Surgical History:  Past Surgical History:  Procedure Laterality Date   ESOPHAGOGASTRODUODENOSCOPY N/A 01/28/2022   Procedure: ESOPHAGOGASTRODUODENOSCOPY (EGD);  Surgeon: Paola Dreama SAILOR, MD;  [facility] ENDOSCOPY;  Service: General;  Laterality: N/A;   IR GASTROSTOMY TUBE MOD SED  08/29/2022   IR REPLC GASTRO/COLONIC TUBE PERCUT W/FLUORO  08/31/2024   PEG PLACEMENT N/A 01/28/2022   Procedure: PERCUTANEOUS ENDOSCOPIC GASTROSTOMY (PEG) PLACEMENT;  Surgeon: Paola Dreama SAILOR, MD;  Location: [facility] ENDOSCOPY;  Service: General;  Laterality: N/A;   HPI:  Cindy Brooks is a 53 y.o. female who presented after her PEG tube became dislodged. Pt has a history of prior bilateral ischemic stroke in 2022 with sequelae of dysphagia requiring PEG tube and weakness. She is still minimally verbal and bed bound.   Pts sister reports pt relies on PEG tube feedings, but also drinks thin liquids from a cup with a straw and also creamy purees from a blender. Sister feels she enjoys this and tolerates it well. She would like for pt to be offered these while admitted.  Pt was previously seen by SLP in 2023, also consuming purees and thin liquids, primary problem was oral dysphagia.    Assessment / Plan / Recommendation  Clinical Impression  Pt demonstrates signs of a primary oral dysphagia and potential for an underlying esophageal dysphagia based on clinical observations.   Pt is alert, contracted and laying on her  left side, which is her fuctioning UE. Her neck seems contracted to the left. After positioning she is still partially reclined and left leaning. Oral motor exam appears normal except for very limited mandibular ROM, suspect trismus, and significant dysphonia.  Vocal quality is low and breathy, cough lacks crisp quality.   Pt is able to drink liquids from a straw without any difficulty, no immediate signs of aspiration. Mastication is complete, but prolonged and effortful. Well after some snacks of varying textures and meds with puree and further sips of water , pt had delayed coughing.   Pt is capable of eating regular diet and thin liquids, but would not be capable of consuming enough calories orally given effort with mastication, need for assisted feeding and frequent refusal of food. Delayed coughing may be a sign of an underlying esophageal issue, which would be anticipated given pts contracted postiion and bedbound status. There is some concern from caregiver about pt getting needed interventions. Will proceed with MBS attempt to get a new baseline on pts swallow function. However pt may not be capable of participation.  SLP Visit Diagnosis: Dysphagia, unspecified (R13.10)    Aspiration Risk       Diet Recommendation Regular;Thin liquid    Liquid Administration via: Cup;Straw Medication Administration: Whole meds with liquid Supervision: Staff to assist with self feeding Compensations: Slow rate;Small sips/bites;Follow solids with liquid Postural Changes: Seated upright at 90 degrees;Remain upright for at least 30 minutes after po intake    Other  Recommendations       Assistance Recommended at Discharge  Functional Status Assessment    Frequency and Duration min 2x/week  1 week       Prognosis        Swallow Study   General HPI: Cindy Brooks is a 53 y.o. female who presented after her PEG tube became dislodged. Pt has a history of prior bilateral ischemic stroke in 2022 with  sequelae of dysphagia requiring PEG tube and weakness. She is still minimally verbal and bed bound.   Pts sister reports pt relies on PEG tube feedings, but also drinks thin liquids from a cup with a straw and also creamy purees from a blender. Sister feels she enjoys this and tolerates it well. She would like for pt to be offered these while admitted.  Pt was previously seen by SLP in 2023, also consuming purees and thin liquids, primary problem was oral dysphagia. Type of Study: Bedside Swallow Evaluation Previous Swallow Assessment: see HPI Diet Prior to this Study: Regular;Thin liquids (Level 0) Temperature Spikes Noted: No Respiratory Status: Room air History of Recent Intubation: No Behavior/Cognition: Alert;Cooperative Oral Cavity Assessment: Within Functional Limits Oral Care Completed by SLP: No Oral Cavity - Dentition: Adequate natural dentition Vision: Functional for self-feeding Self-Feeding Abilities: Needs assist Patient Positioning: Postural control interferes with function Baseline Vocal Quality: Breathy;Low vocal intensity Volitional Cough: Weak Volitional Swallow: Able to elicit    Oral/Motor/Sensory Function Overall Oral Motor/Sensory Function: Moderate impairment Facial ROM: Within Functional Limits Mandible: Impaired   Ice Chips Ice chips: Not tested   Thin Liquid Thin Liquid: Within functional limits Presentation: Straw    Nectar Thick Nectar Thick Liquid: Not tested   Honey Thick Honey Thick Liquid: Not tested   Puree Puree: Within functional limits Presentation: Spoon   Solid     Solid: Impaired Presentation: Self Fed Oral Phase Impairments: Impaired mastication Oral Phase Functional Implications: Impaired mastication      Cindy Brooks, [name] 09/01/2024,11:01 AM

## 2024-09-01 NOTE — Progress Notes (Signed)
 Nutrition Brief Note    RD followed-up with patient and daughter in room after SLP evaluation. Daughter shares that pt does eat by mouth, but is unsure her actual daily PO intake as her other sister is primary caregiver. Shares that she drinks water  and juice by mouth via a cup similar to a  sippy cup.  Reports that she does received tube feeds via her PEG daily, believes that it is typically 3 per day but was not exactly sure. Daughter does endorse weight loss, reports that she was about 160# after her stroke happened. Shares that she does not know exactly how much she has lost but could tell when she was able to transfer patient without needing help.   RD spoke with Holley Herring from [facility] (DME), pt is currently still active to receive home tube feed supply. SLP plans to complete MBS to fully assess patients swallow. Recommend following current interventions listed below.   INTERVENTION:  Diet per SLP  Encourage PO as able Tube feeds via PEG tube: 1 carton Osmolite 1.5 (237 mL) - TID between meal times Start with 1/2 a carton x 2 feeds, increase to 1 full carton at third feed if tolerated Administer slowly over 15-20 minutes via gravity Free water  flush: 30 mL before and after each bolus feed + 60 mL between feeds Provides 1067 kcal (meets 76% estimated energy needs), 45 gm protein (meets 64% of estimated protein needs), and 903 mL free water  daily.  If patient is made NPO or  does not have sufficient PO, would consider increase Osmolite 1.5 to QID. This would provide 1422 kcal, 60 gm protein, and 1084 mL free water  daily.  Monitor magnesium , potassium, and phosphorus daily for at least 3 days, MD to replete as needed, as pt is at risk for refeeding syndrome given malnutrition and unknown nutritional intake prior to admission. Thiamine  100 mg daily for 7 days Multivitamin w/ minerals daily    Nestora Glatter RD, LDN Registered Dietitian I Please see AMION for contact information

## 2024-09-01 NOTE — Progress Notes (Signed)
 Patient refusing vital signs, blood glucose, medications, and lab draw this morning. Provider made aware.

## 2024-09-01 NOTE — Progress Notes (Signed)
 Subjective:   Summary: Cindy Brooks is a 53 y.o. year old female currently admitted on HD1 for PEG tube dislodgement and chronic deconditioning.  Overnight Events: Pt became more agitated and refused labs, vitals, and medications  Patient was seen and examined at the bedside this morning she was resting comfortably upon time of conversation.  She was having more difficulty engaging in conversation with shaking her head and nodding her head.  There was no family at the bedside.  Suspect the patient will do better with family in the hospital.  Objective:  Vital signs in last 24 hours: Vitals:   09/01/24 0538 09/01/24 0629 09/01/24 0801 09/01/24 0922  BP: (!) 82/58 121/82 104/65   Pulse: 85 67 (!) 58 62  Resp: 16     Temp: 97.6 F (36.4 C)     TempSrc: Oral     SpO2: 100%     Weight:      Height:       Supplemental O2: Room Air SpO2: 100 %   Physical Exam:  Const: Awake, alert in NAD HENT: Normocephalic, atraumatic, mucus membranes moist Card: RRR, No MRG, No pitting edema on LE's bilaterally  Resp: LCTAB, no increased work of breathing Abd: Thin, soft, Nontender, PEG tub in place-dressing c/d/i  Extremities: Warm, dry with skin tenting bilaterally.    Filed Weights   08/31/24 1300 09/01/24 0500  Weight: 34.4 kg 38.7 kg     Intake/Output Summary (Last 24 hours) at 09/01/2024 1039 Last data filed at 09/01/2024 9076 Gross per 24 hour  Intake 120 ml  Output 500 ml  Net -380 ml   Net IO Since Admission: -380 mL [09/01/24 1039]  Pertinent Labs:    Latest Ref Rng & Units 08/31/2024    5:37 AM 08/30/2024    9:31 PM 10/16/2022   11:47 AM  CBC  WBC 4.0 - 10.5 K/uL 5.2  4.5  5.8   Hemoglobin 12.0 - 15.0 g/dL 89.4  88.1  87.8   Hematocrit 36.0 - 46.0 % 31.1  35.3  37.0   Platelets 150 - 400 K/uL 201  204  291        Latest Ref Rng & Units 08/31/2024    5:37 AM 08/30/2024    9:31 PM 08/26/2024   12:30 PM  CMP  Glucose 70 - 99 mg/dL 73  84   877   BUN 6 - 20 mg/dL 14  16  20    Creatinine 0.44 - 1.00 mg/dL 8.97  8.92  8.88   Sodium 135 - 145 mmol/L 139  136  142   Potassium 3.5 - 5.1 mmol/L 3.7  3.7  4.6   Chloride 98 - 111 mmol/L 105  101  99   CO2 22 - 32 mmol/L 25  26  23    Calcium  8.9 - 10.3 mg/dL 9.4  9.8  89.6   Total Protein 6.0 - 8.5 g/dL   8.0   Total Bilirubin 0.0 - 1.2 mg/dL   0.4   Alkaline Phos 49 - 135 IU/L   103   AST 0 - 40 IU/L   21   ALT 0 - 32 IU/L   19      Imaging: No results found.  Assessment/Plan:   Principal Problem:   PEG tube malfunction (HCC) Active Problems:   Acute bilateral thalamic, midbrain and bilateral cerebral ischemic CVA   HTN (hypertension)  CKD (chronic kidney disease)   Chronic indwelling Foley catheter   Malnutrition of moderate degree   Patient Summary: Cindy Brooks is a 53 y.o. person living with a history of prior bilateral ischemic stroke in 2022 with sequelae of dysphagia requiring PEG tube and weakness, HTN, HLD, chronic indwelling foley, and debility who presented after her PEG tube became dislodged, and admitted for PEG tube replacement   #PEG Tube Dislodgement #Dysphagia 2/2 Ischemic Stroke -S/p PEG tube replacement per IR 11/18.  PEG tube stable and ready for use. -Appreciate RD recommendations.  Continue tube feeding however patient was refusing lab draws this morning and was not wanting to participate in her care.  She was fully oriented.  If patient continues to refuse labs and hospital interventions, will slow down tube feeding drastically as we have concern for refeeding syndrome.  #Chronic Conditioning  Discussed with daughter in the room, appears she has been having a decline in functional status for quite some time now. Her daughter feels like her appetite is not up to par as it was before, and she has become progressively weaker. She has been able to tolerate solids recently, as well as swallow her pills. Her daughter states that she has big pill  bottles at home with her medications.  - PT/OT/SLP evals pending today. -The patient would benefit from discharging to SNF however there is some disagreement and confusion with the patient as to whether she is willing to go.  Will continue to have open conversations with the family and the patient to ensure an informed decision.   #Chronic Indwelling Catheter  Exchanged 11/18, no suprapubic pain or signs of infection  #HTN Continuing her home medications of amlodipine  10mg  and metoprolol   #Hx of CVA  #HLD  Continuing statin and aspirin    #Hx of Depression  Restarting sertraline   #Elevated Creatinine  Monitoring   #Goals of care Family reports deconditioning over the past few months.  The patient has had waxing and waning moments of wanting to participate in her care while here.  I do think it be beneficial to have conversation with the family about what her goals are moving forward.  If patient continues to refuse care it may be beneficial to involve palliative.  Diet: Normal IVF: None VTE: Enoxaparin  Code: Full PT/OT recs: Pending, Pending  TOC recs: Pending Family Update: Bedside  Dispo: Anticipated discharge to Rehab in 2 days pending medical management.   Schuyler Novak, DO Internal Medicine Teaching Service, PGY-1 Please call on-call pager [phone]

## 2024-09-01 NOTE — Evaluation (Signed)
 Physical Therapy Evaluation Patient Details Name: Cindy Brooks MRN: [id number] DOB: [birth year] Today's Date: 09/01/2024  History of Present Illness  Cindy Brooks is a 53 y.o. year old female admitted 08/30/24 for PEG tube dislodgement and generalized weakness. Now s/p PEG tube replacement.  PMH includes: Seizure, bilateral ischemic stroke in 2022 with subsequent dysphagia requiring PEG tube, aphasia, hemiplegia, and a chronic indwelling foley; and Urinary tract infection (01/28/2022), Esophagogastroduodenoscopy (01/28/2022); PEG placement (01/28/2022); IR GASTROSTOMY TUBE MOD SED (08/29/2022); and IR Replc Gastro/Colonic Tube Percut W/Fluoro (08/31/2024).  Clinical Impression  Pt is presenting currently at Min A for bed mobility, 2 person Mod A for sit to stand and short distance gait with 2 person HHA. Pt demonstrates extensor tone in R UE/LE and demonstrates an ataxic gait. Pt never went to rehab after initial stroke and mobility has been deteriorating over the past 2 years per daughter which is increased strain on caregivers. Due to pt current functional status, home set up and available assistance at home recommending skilled physical therapy services < 3 hours/day in order to address strength, balance and functional mobility to decrease risk for falls, injury, immobility, skin break down and re-hospitalization.        If plan is discharge home, recommend the following: A little help with walking and/or transfers;Assist for transportation;Help with stairs or ramp for entrance;Assistance with cooking/housework   Can travel by private vehicle   No    Equipment Recommendations None recommended by PT     Functional Status Assessment Patient has had a recent decline in their functional status and demonstrates the ability to make significant improvements in function in a reasonable and predictable amount of time.     Precautions / Restrictions Precautions Precautions: Fall Recall of  Precautions/Restrictions: Impaired Precaution/Restrictions Comments: Gtube replacement Restrictions Weight Bearing Restrictions Per Provider Order: No      Mobility  Bed Mobility Overal bed mobility: Needs Assistance Bed Mobility: Rolling, Supine to Sit, Sit to Supine Rolling: Supervision, Used rails (both ways)   Supine to sit: Min assist (assist for trunk elevation only, Pt able to manage BLE and rotation with bed rails) Sit to supine: Min assist (BLE back into bed)   General bed mobility comments: with bed rails Pt is able to assist and motivated to assist    Transfers Overall transfer level: Needs assistance Equipment used: 2 person hand held assist Transfers: Sit to/from Stand Sit to Stand: Mod assist, +2 safety/equipment           General transfer comment: Pt pushing through on L side with mod A from therapist, min A on R side to power up    Ambulation/Gait Ambulation/Gait assistance: Mod assist, +2 physical assistance, +2 safety/equipment Gait Distance (Feet): 8 Feet (8 fwd, 8 back) Assistive device: 2 person hand held assist Gait Pattern/deviations: Ataxic, Step-through pattern, Leaning posteriorly, Trunk flexed Gait velocity: decreased Gait velocity interpretation: <1.31 ft/sec, indicative of household ambulator   General Gait Details: Pt with posterior lean, Mod A for balance/strength. Step through gait pattern with varying levels of ataxia.    Balance Overall balance assessment: Needs assistance Sitting-balance support: Bilateral upper extremity supported, Feet supported Sitting balance-Leahy Scale: Fair (mod A progressing to min guard)   Postural control: Posterior lean Standing balance support: Bilateral upper extremity supported, Reliant on assistive device for balance (or external assist from therapists) Standing balance-Leahy Scale: Poor Standing balance comment: 2 person Mod A to remain standing       Pertinent Vitals/Pain Pain Assessment Pain  Assessment: No/denies pain Pain Intervention(s): Monitored during session, Repositioned    Home Living Family/patient expects to be discharged to:: Skilled nursing facility Living Arrangements: Children (lives with daughter) Available Help at Discharge: Family;Available 24 hours/day Type of Home: House Home Access: Stairs to enter;Ramped entrance Entrance Stairs-Rails: None Entrance Stairs-Number of Steps: 3   Home Layout: One level Home Equipment: Rollator (4 wheels);Cane - quad;Wheelchair - manual;BSC/3in1;Other (comment) Additional Comments: gait belts    Prior Function Prior Level of Function : Needs assist  Cognitive Assist : ADLs (cognitive)     Physical Assist : Mobility (physical) Mobility (physical): Bed mobility;Transfers;Gait;Stairs   Mobility Comments: About 1 year ago pt was able to get out of the bed and ambulate. In the past month pt was able to stand with some assistance, pick legs up in a reciprocal pattern, able to ind roll, able to sit EOB without support, uses W/C out in the community ADLs Comments: pt needs assistance with ADLs, PEG tube.     Extremity/Trunk Assessment   Upper Extremity Assessment Upper Extremity Assessment: Defer to OT evaluation RUE Deficits / Details: fingers with contracture - limited ROM and painful for extension, elbow full ROM, RUE Sensation: WNL RUE Coordination: decreased fine motor;decreased gross motor    Lower Extremity Assessment Lower Extremity Assessment: Generalized weakness (extensor tone noted in RUE/RLE simultaneously on initially sitting)    Cervical / Trunk Assessment Cervical / Trunk Assessment: Kyphotic  Communication   Communication Communication: Impaired Factors Affecting Communication: Difficulty expressing self    Cognition Arousal: Alert Behavior During Therapy: WFL for tasks assessed/performed, Flat affect   PT - Cognitive impairments: Difficult to assess Difficult to assess due to: Impaired  communication     Following commands: Intact       Cueing Cueing Techniques: Verbal cues, Tactile cues, Gestural cues     General Comments General comments (skin integrity, edema, etc.): Daughter present and confirming PLOF        Assessment/Plan    PT Assessment Patient needs continued PT services  PT Problem List Decreased strength;Decreased activity tolerance;Decreased balance;Decreased mobility;Decreased range of motion;Decreased safety awareness       PT Treatment Interventions DME instruction;Balance training;Gait training;Functional mobility training;Patient/family education;Therapeutic activities;Wheelchair mobility training;Therapeutic exercise    PT Goals (Current goals can be found in the Care Plan section)  Acute Rehab PT Goals Patient Stated Goal: to improve functional mobility to pt full capabilities PT Goal Formulation: With patient/family Time For Goal Achievement: 09/15/24 Potential to Achieve Goals: Fair    Frequency Min 2X/week        AM-PAC PT 6 Clicks Mobility  Outcome Measure Help needed turning from your back to your side while in a flat bed without using bedrails?: A Lot Help needed moving from lying on your back to sitting on the side of a flat bed without using bedrails?: A Lot Help needed moving to and from a bed to a chair (including a wheelchair)?: A Lot Help needed standing up from a chair using your arms (e.g., wheelchair or bedside chair)?: A Lot Help needed to walk in hospital room?: Total Help needed climbing 3-5 steps with a railing? : Total 6 Click Score: 10    End of Session Equipment Utilized During Treatment: Gait belt Activity Tolerance: Patient tolerated treatment well;Patient limited by fatigue Patient left: in bed;with call bell/phone within reach;with bed alarm set;with family/visitor present Nurse Communication: Mobility status PT Visit Diagnosis: Unsteadiness on feet (R26.81);Other abnormalities of gait and mobility  (R26.89);Muscle weakness (generalized) (M62.81)  Time: 8799-8764 PT Time Calculation (min) (ACUTE ONLY): 35 min   Charges:   PT Evaluation $PT Eval Low Complexity: 1 Low   PT General Charges $$ ACUTE PT VISIT: 1 Visit         Dorothyann Maier, DPT, CLT  Acute Rehabilitation Services Office: [phone] (Secure chat preferred)   Dorothyann VEAR Maier 09/01/2024, 4:52 PM

## 2024-09-01 NOTE — Evaluation (Signed)
 Occupational Therapy Evaluation Patient Details Name: Cindy Brooks MRN: [id number] DOB: [birth year] Today's Date: 09/01/2024   History of Present Illness   Cindy Brooks is a 53 y.o. year old female admitted 08/30/24 for PEG tube dislodgement and generalized weakness. Now s/p PEG tube replacement.  PMH includes: Seizure, bilateral ischemic stroke in 2022 with subsequent dysphagia requiring PEG tube, aphasia, hemiplegia, and a chronic indwelling foley; and Urinary tract infection (01/28/2022), Esophagogastroduodenoscopy (01/28/2022); PEG placement (01/28/2022); IR GASTROSTOMY TUBE MOD SED (08/29/2022); and IR Replc Gastro/Colonic Tube Percut W/Fluoro (08/31/2024).     Clinical Impressions At her baseline, Pt spends time and has ADL care at bed level but is NOT BEDBOUND. She pivots to a wheelchair for appointments and also has a recliner she likes to sit in. She is max A for ADL currently overall due to generalized weakness, typically she assists with ADL - washing the front of her body and tops of her legs. At baseline she has a disconjugate gaze and reports constant double vision - which she has never had any glasses or intervention for per daughter. Today she was able to perform grooming tasks with her left hand, limited BUE tasks due to R hand with semi-contracted. She was min A overall for bed mobility, and able to take steps in the room with mod A +2. She demonstrated good strength and motivation and will require skilled OT in the acute setting as well as afterwards at rehab of <3 hour daily to maximize safety and independence in ADL and functional transfers.       If plan is discharge home, recommend the following:   Two people to help with walking and/or transfers;A lot of help with bathing/dressing/bathroom     Functional Status Assessment   Patient has had a recent decline in their functional status and demonstrates the ability to make significant improvements in function in a  reasonable and predictable amount of time.     Equipment Recommendations   Tub/shower seat     Recommendations for Other Services   PT consult;Speech consult     Precautions/Restrictions   Precautions Precautions: Fall Recall of Precautions/Restrictions: Impaired Precaution/Restrictions Comments: Gtube replacement Restrictions Weight Bearing Restrictions Per Provider Order: No     Mobility Bed Mobility Overal bed mobility: Needs Assistance Bed Mobility: Rolling, Supine to Sit, Sit to Supine Rolling: Supervision, Used rails (both ways)   Supine to sit: Min assist (assist for trunk elevation only, Pt able to manage BLE and rotation with bed rails) Sit to supine: Min assist (BLE back into bed)   General bed mobility comments: with bed rails Pt is able to assist and motivated to assist    Transfers Overall transfer level: Needs assistance Equipment used: 2 person hand held assist Transfers: Sit to/from Stand Sit to Stand: Mod assist, +2 safety/equipment           General transfer comment: Pt pushing through on L side with mod A from therapist, min A on R side to power up      Balance Overall balance assessment: Needs assistance Sitting-balance support: Bilateral upper extremity supported, Feet supported Sitting balance-Leahy Scale: Fair (mod A progressing to min guard)     Standing balance support: Bilateral upper extremity supported, Reliant on assistive device for balance (or external assist from therapists) Standing balance-Leahy Scale: Poor                             ADL either performed or  assessed with clinical judgement   ADL Overall ADL's : Needs assistance/impaired Eating/Feeding: Maximal assistance   Grooming: Wash/dry face;Minimal assistance;Sitting Grooming Details (indicate cue type and reason): able to use LUE to wash face will need more assist for BUE tasks Upper Body Bathing: Moderate assistance Upper Body Bathing Details  (indicate cue type and reason): for back Lower Body Bathing: Maximal assistance   Upper Body Dressing : Moderate assistance;Sitting   Lower Body Dressing: Maximal assistance;Sit to/from stand   Toilet Transfer: Moderate assistance;+2 for physical assistance;+2 for safety/equipment;Ambulation   Toileting- Clothing Manipulation and Hygiene: Maximal assistance;Bed level       Functional mobility during ADLs: Moderate assistance;+2 for physical assistance;+2 for safety/equipment (2 person HHA) General ADL Comments: decreased activity tolerance from baseline, decreased balance     Vision Ability to See in Adequate Light: 1 Impaired Patient Visual Report: No change from baseline Vision Assessment?: Vision impaired- to be further tested in functional context Additional Comments: Pt with obvious dysconjugate gaze. Pt reports yes to double vision all the time     Perception         Praxis         Pertinent Vitals/Pain Pain Assessment Pain Assessment: Faces Faces Pain Scale: No hurt Breathing: normal Negative Vocalization: none Facial Expression: smiling or inexpressive Body Language: relaxed Consolability: no need to console PAINAD Score: 0 Pain Intervention(s): Monitored during session, Repositioned     Extremity/Trunk Assessment Upper Extremity Assessment Upper Extremity Assessment: Generalized weakness;RUE deficits/detail RUE Deficits / Details: fingers with contracture - limited ROM and painful for extension, elbow full ROM, RUE Sensation: WNL RUE Coordination: decreased fine motor;decreased gross motor   Lower Extremity Assessment Lower Extremity Assessment: Defer to PT evaluation   Cervical / Trunk Assessment Cervical / Trunk Assessment: Kyphotic (head very flexed and slightly to the left)   Communication Communication Communication: Impaired Factors Affecting Communication: Difficulty expressing self   Cognition Arousal: Alert Behavior During Therapy: WFL  for tasks assessed/performed, Flat affect Cognition: Difficult to assess Difficult to assess due to: Impaired communication           OT - Cognition Comments: Pt follow commands, demonstrates initiation of tasks, gives soft spoken one word responses. She understands more than she expresses                 Following commands: Intact       Cueing  General Comments   Cueing Techniques: Verbal cues;Tactile cues;Gestural cues  Daughter present and confirming PLOF   Exercises Exercises: Other exercises Other Exercises Other Exercises: ROM of R hand as contracture allows Other Exercises: ROM of cervical to left and right and up and down   Shoulder Instructions      Home Living Family/patient expects to be discharged to:: Skilled nursing facility Living Arrangements: Children (lives with daughter) Available Help at Discharge: Family;Available 24 hours/day Type of Home: House Home Access: Stairs to enter;Ramped entrance Entrance Stairs-Number of Steps: 3 Entrance Stairs-Rails: None Home Layout: One level     Bathroom Shower/Tub: Chief Strategy Officer: Standard Bathroom Accessibility: No   Home Equipment: Rollator (4 wheels);Cane - quad;Wheelchair - manual;BSC/3in1;Other (comment)   Additional Comments: gait belts      Prior Functioning/Environment Prior Level of Function : Needs assist  Cognitive Assist : ADLs (cognitive)     Physical Assist : Mobility (physical) Mobility (physical): Bed mobility;Transfers;Gait;Stairs   Mobility Comments: About 1 year ago pt was able to get out of the bed and ambulate. In the  past month pt was able to stand with some assistance, pick legs up in a reciprocal pattern, able to ind roll, able to sit EOB without support, uses W/C out in the community ADLs Comments: pt needs assistance with ADLs, PEG tube.    OT Problem List: Decreased strength;Decreased activity tolerance;Impaired balance (sitting and/or  standing);Impaired UE functional use   OT Treatment/Interventions: Self-care/ADL training;Therapeutic exercise;Energy conservation;DME and/or AE instruction;Therapeutic activities;Balance training;Patient/family education;Modalities      OT Goals(Current goals can be found in the care plan section)   Acute Rehab OT Goals Patient Stated Goal: get stronger, work with therapy OT Goal Formulation: With family Time For Goal Achievement: 09/15/24 Potential to Achieve Goals: Good ADL Goals Pt Will Perform Grooming: with contact guard assist;sitting Pt Will Perform Upper Body Dressing: with contact guard assist;sitting Pt Will Perform Lower Body Dressing: with mod assist;sit to/from stand Pt Will Transfer to Toilet: with min assist;stand pivot transfer;bedside commode Pt Will Perform Toileting - Clothing Manipulation and hygiene: with min assist;sitting/lateral leans Pt/caregiver will Perform Home Exercise Program: Right Upper extremity;With minimal assist;With written HEP provided   OT Frequency:  Min 2X/week    Co-evaluation              AM-PAC OT 6 Clicks Daily Activity     Outcome Measure Help from another person eating meals?: A Lot Help from another person taking care of personal grooming?: A Lot Help from another person toileting, which includes using toliet, bedpan, or urinal?: A Lot Help from another person bathing (including washing, rinsing, drying)?: A Lot Help from another person to put on and taking off regular upper body clothing?: A Lot Help from another person to put on and taking off regular lower body clothing?: A Lot 6 Click Score: 12   End of Session Equipment Utilized During Treatment: Gait belt Nurse Communication: Mobility status;Precautions (use BSC)  Activity Tolerance: Patient tolerated treatment well Patient left: in bed;with call bell/phone within reach;with family/visitor present  OT Visit Diagnosis: Unsteadiness on feet (R26.81);Muscle weakness  (generalized) (M62.81);Other abnormalities of gait and mobility (R26.89);Low vision, both eyes (H54.2);Other symptoms and signs involving the nervous system (R29.898);Cognitive communication deficit (R41.841);Hemiplegia and hemiparesis Hemiplegia - Right/Left: Right Hemiplegia - dominant/non-dominant: Dominant                Time: 8840-8770 OT Time Calculation (min): 30 min Charges:  OT General Charges $OT Visit: 1 Visit OT Evaluation $OT Eval Moderate Complexity: 1 Mod  Leita DEL OTR/L Acute Rehabilitation Services Office: [phone]  Leita JINNY Odea 09/01/2024, 1:57 PM

## 2024-09-02 ENCOUNTER — Observation Stay (HOSPITAL_COMMUNITY)

## 2024-09-02 DIAGNOSIS — K9423 Gastrostomy malfunction: Secondary | ICD-10-CM | POA: Diagnosis not present

## 2024-09-02 DIAGNOSIS — E43 Unspecified severe protein-calorie malnutrition: Secondary | ICD-10-CM

## 2024-09-02 DIAGNOSIS — Z8673 Personal history of transient ischemic attack (TIA), and cerebral infarction without residual deficits: Secondary | ICD-10-CM | POA: Diagnosis not present

## 2024-09-02 DIAGNOSIS — Z79899 Other long term (current) drug therapy: Secondary | ICD-10-CM

## 2024-09-02 DIAGNOSIS — I10 Essential (primary) hypertension: Secondary | ICD-10-CM

## 2024-09-02 DIAGNOSIS — R5381 Other malaise: Secondary | ICD-10-CM | POA: Diagnosis not present

## 2024-09-02 LAB — CBC
HCT: 28 % — ABNORMAL LOW (ref 36.0–46.0)
Hemoglobin: 9.4 g/dL — ABNORMAL LOW (ref 12.0–15.0)
MCH: 28.9 pg (ref 26.0–34.0)
MCHC: 33.6 g/dL (ref 30.0–36.0)
MCV: 86.2 fL (ref 80.0–100.0)
Platelets: 179 K/uL (ref 150–400)
RBC: 3.25 MIL/uL — ABNORMAL LOW (ref 3.87–5.11)
RDW: 13.9 % (ref 11.5–15.5)
WBC: 4.4 K/uL (ref 4.0–10.5)
nRBC: 0 % (ref 0.0–0.2)

## 2024-09-02 LAB — GLUCOSE, CAPILLARY
Glucose-Capillary: 117 mg/dL — ABNORMAL HIGH (ref 70–99)
Glucose-Capillary: 80 mg/dL (ref 70–99)
Glucose-Capillary: 110 mg/dL — ABNORMAL HIGH (ref 70–99)
Glucose-Capillary: 120 mg/dL — ABNORMAL HIGH (ref 70–99)

## 2024-09-02 LAB — BASIC METABOLIC PANEL WITH GFR
Anion gap: 8 (ref 5–15)
BUN: 15 mg/dL (ref 6–20)
CO2: 27 mmol/L (ref 22–32)
Calcium: 8.9 mg/dL (ref 8.9–10.3)
Chloride: 102 mmol/L (ref 98–111)
Creatinine, Ser: 1.15 mg/dL — ABNORMAL HIGH (ref 0.44–1.00)
GFR, Estimated: 57 mL/min — ABNORMAL LOW (ref >60)
Glucose, Bld: 126 mg/dL — ABNORMAL HIGH (ref 70–99)
Potassium: 4.4 mmol/L (ref 3.5–5.1)
Sodium: 137 mmol/L (ref 135–145)

## 2024-09-02 LAB — MAGNESIUM: Magnesium: 1.9 mg/dL (ref 1.7–2.4)

## 2024-09-02 LAB — PHOSPHORUS: Phosphorus: 2.8 mg/dL (ref 2.5–4.6)

## 2024-09-02 MED ORDER — DEXTROSE 5 % AND 0.45 % NACL IV BOLUS
1000.0000 mL | Freq: Once | INTRAVENOUS | Status: AC
  Administered 2024-09-02: 1000 mL via INTRAVENOUS

## 2024-09-02 MED ORDER — OSMOLITE 1.5 CAL PO LIQD
237.0000 mL | Freq: Four times a day (QID) | ORAL | Status: DC
  Administered 2024-09-02 – 2024-09-06 (×16): 237 mL
  Filled 2024-09-02 (×20): qty 237

## 2024-09-02 MED ORDER — FREE WATER
135.0000 mL | Freq: Four times a day (QID) | Status: DC
  Administered 2024-09-02 – 2024-09-05 (×11): 135 mL

## 2024-09-02 MED ORDER — FREE WATER
60.0000 mL | Freq: Four times a day (QID) | Status: DC
  Administered 2024-09-02 – 2024-09-06 (×15): 60 mL

## 2024-09-02 NOTE — Plan of Care (Signed)

## 2024-09-02 NOTE — Progress Notes (Signed)
 Nutrition Brief Note   MD reached out to RD abut increasing tube feeds given ongoing hypotension and minimal PO intake. RD to update enteral nutrition regimen to meet estimated caloric and hydration needs. Pleas allow pt to continue to PO intake in addition to tube feeds.   INTERVENTION:  Diet per SLP  Encourage PO as able Tube feeds via PEG tube: 1 carton Osmolite 1.5 (237 mL) - QID between meal times Administer slowly over 15-20 minutes via gravity Free water  flush: 30 mL before and after each bolus feed + 135 mL between feeds x 4  Regimen at goal provides 1422 kcal, 60 gm protein, and 1504 mL free water  daily.  Continue to monitor magnesium , potassium, and phosphorus daily for at least 3 days, MD to replete as needed, as pt is at risk for refeeding syndrome given malnutrition and unknown nutritional intake prior to admission. Thiamine  100 mg daily for 7 days Multivitamin w/ minerals daily    Nestora Glatter RD, LDN Registered Dietitian I Please see AMION for contact information

## 2024-09-02 NOTE — Progress Notes (Signed)
 Mobility Specialist Progress Note:   09/02/24 1023  Mobility  Activity Pivoted/transferred from bed to chair  Level of Assistance Moderate assist, patient does 50-74% (+2)  Assistive Device Other (Comment) (HHA)  Distance Ambulated (ft) 3 ft  Activity Response Tolerated well  Mobility Referral Yes  Mobility visit 1 Mobility  Mobility Specialist Start Time (ACUTE ONLY) 1013  Mobility Specialist Stop Time (ACUTE ONLY) 1023  Mobility Specialist Time Calculation (min) (ACUTE ONLY) 10 min   Received pt in bed and agreeable to mobility. Pt required ModA +2 to EOB and to pivot. No c/o. Left pt in chair with personal belongings and call light within reach. All needs met.  Lavanda Pollack Mobility Specialist  Please contact via [employer] or  Rehab Office [phone]

## 2024-09-02 NOTE — TOC Progression Note (Signed)
 Transition of Care [facility]) - Progression Note    Patient Details  Name: Cindy Brooks MRN: [id number] Date of Birth: 1971/05/15  Transition of Care [facility]) CM/SW Contact  Lauraine FORBES Saa, LCSWA Phone Number: 09/02/2024, 4:15 PM  Clinical Narrative:     4:15 PM CSW introduced self and role to patient's daughter, Cindy Brooks (per chart review, patient is not fully oriented). CSW informed [name] of lack of SNF options for patient in [place] due to insurance coverage. [name] expressed understanding of the information and consented CSW to email bed offers ([facility], [facility], [facility], [facility], [facility], [facility], [address]) to [email or web address]. CSW emailed [place] SNF options. CSW will continue to follow.   Expected Discharge Plan: Skilled Nursing Facility Barriers to Discharge: Continued Medical Work up, English As A Second Language Teacher, SNF Pending bed offer               Expected Discharge Plan and Services In-house Referral: Clinical Social Work   Post Acute Care Choice: Skilled Nursing Facility Living arrangements for the past 2 months: Single Family Home                                       Social Drivers of Health (SDOH) Interventions SDOH Screenings   Food Insecurity: No Food Insecurity (08/31/2024)  Housing: Low Risk  (08/31/2024)  Transportation Needs: No Transportation Needs (08/31/2024)  Utilities: Not At Risk (08/31/2024)  Depression (PHQ2-9): High Risk (05/14/2022)  Tobacco Use: Low Risk  (08/26/2024)    Readmission Risk Interventions    09/02/2022    2:23 PM 05/28/2022    2:27 PM 03/13/2022    9:03 AM  Readmission Risk Prevention Plan  Transportation Screening Complete Complete Complete  PCP or Specialist Appt within 5-7 Days   Complete  PCP or Specialist Appt within 3-5 Days  Complete   Home Care Screening   Complete  Medication Review (RN CM)   Complete  HRI or Home Care Consult  Complete   Social Work Consult  for Recovery Care Planning/Counseling  Complete   Palliative Care Screening  Not Applicable   Medication Review Oceanographer) Complete Complete   PCP or Specialist appointment within 3-5 days of discharge Complete    HRI or Home Care Consult Complete    SW Recovery Care/Counseling Consult Complete    Palliative Care Screening Complete    Skilled Nursing Facility Not Applicable

## 2024-09-02 NOTE — Evaluation (Signed)
 Modified Barium Swallow Study  Patient Details  Name: Cindy Brooks MRN: [id number] Date of Birth: January 07, 1971  Today's Date: 09/02/2024  Modified Barium Swallow completed.  Full report located under Chart Review in the Imaging Section.  History of Present Illness Cindy Brooks is a 53 y.o. female who presented after her PEG tube became dislodged. Pt has a history of prior bilateral ischemic stroke in 2022 with sequelae of dysphagia requiring PEG tube and weakness. She is still minimally verbal and bed bound.   Pts sister reports pt relies on PEG tube feedings, but also drinks thin liquids from a cup with a straw and also creamy purees from a blender. Sister feels she enjoys this and tolerates it well. She would like for pt to be offered these while admitted.  Pt was previously seen by SLP in 2023, also consuming purees and thin liquids, primary problem was oral dysphagia.   Clinical Impression Pt has an oropharyngeal dysphagia with intermittent aspiration that is silent. Orally her mastication is a little limited by mandibular ROM but overall her oral transit is typically swift and fully effective. With solids it can be a little slower, and there is very little residue that she clears with a second swallow. Pharyngeally, part of her function is impacted by pt positioning with pharynx positioned on more of a horizontal plane. When she has mistiming of her swallow, this results in thin and nectar thick liquids spilling into her laryngeal vestibule. She has incomplete laryngeal vestibule closure and liquids intermittently spill in small amounts below the airway  (PAS 8, silent aspiration). This is consistently cleared with a cued cough, despite the cough sounding very weak. She also tends to have penetration more than aspiration when given help to pace and take small, single sips at a time. It wasn't until she was at consistencies as thick as honey thick liquids and purees/solids that she did not have  barium enter her airway. Given that esophageal sweep was clear, question if this intermittent aspiration is contributing to the delayed cough noted at bedside.   Given that her cough was consistent in clearing aspirates on this study, and that there is no documented concern for PNA, it would be reasonable to continue on thin liquids with careful assistance during meals and help with positioning. This does appear to be a change compared to previous MBS though and could be attributed to some of her more acute deconditioning, so could consider a trial of dysphagia therapy. Will definitely f/u to try to provide education with family.    Factors that may increase risk of adverse event in presence of aspiration Noe & Lianne 2021): Poor general health and/or compromised immunity;Reduced cognitive function;Frail or deconditioned;Limited mobility;Dependence for feeding and/or oral hygiene;Presence of tubes (ETT, trach, NG, etc.);Aspiration of thick, dense, and/or acidic materials  Swallow Evaluation Recommendations Recommendations: PO diet PO Diet Recommendation: Dysphagia 3 (Mechanical soft);Thin liquids (Level 0) Liquid Administration via: Straw Medication Administration: Whole meds with puree Supervision: Staff to assist with self-feeding;Full supervision/cueing for swallowing strategies Swallowing strategies  : Minimize environmental distractions;Slow rate;Small bites/sips;Hard cough after swallowing Postural changes: Position pt fully upright for meals Oral care recommendations: Oral care BID (2x/day)      Leita SAILOR., M.A. CCC-SLP Acute Rehabilitation Services Office: [phone]  Secure chat preferred  09/02/2024,10:55 AM

## 2024-09-02 NOTE — Progress Notes (Addendum)
 Mobility Specialist Progress Note:   09/02/24 1206  Mobility  Activity Pivoted/transferred from chair to bed  Level of Assistance Moderate assist, patient does 50-74% (+2)  Assistive Device Other (Comment) (HHA)  Distance Ambulated (ft) 3 ft  Activity Response Tolerated well  Mobility Referral Yes  Mobility visit 1 Mobility  Mobility Specialist Start Time (ACUTE ONLY) 1156  Mobility Specialist Stop Time (ACUTE ONLY) 1206  Mobility Specialist Time Calculation (min) (ACUTE ONLY) 10 min   Received pt in chair and agreeable to mobility. Pt required ModA +2 to pivot. No c/o. Left pt in bed with alarm on. Personal belongings and call light within reach. All needs met.  Lavanda Pollack Mobility Specialist  Please contact via [employer] or  Rehab Office [phone]

## 2024-09-02 NOTE — Progress Notes (Signed)
 Subjective:   Summary: Desaree Downen is a 53 y.o. year old female currently admitted on HD1 for PEG tube dislodgement and chronic deconditioning.  Overnight Events: No acute events overnight  Patient was seen and examined at the bedside this morning.  She was resting comfortably and was more alert and interactive with the team today.  She asked for a cup of water  and would engage in conversation by nodding her head yes and shaking her head no.  Objective:  Vital signs in last 24 hours: Vitals:   09/01/24 1900 09/01/24 2052 09/02/24 0726 09/02/24 0733  BP: 92/65 91/63 132/88 90/66  Pulse: 71 73 63 73  Resp:   16 16  Temp:   97.7 F (36.5 C) 97.6 F (36.4 C)  TempSrc:   Oral   SpO2:   99% 100%  Weight:      Height:       Supplemental O2: Room Air SpO2: 100 %   Physical Exam:  Const: Awake, alert in NAD HENT: Normocephalic, atraumatic, mucus membranes moist Card: RRR, No MRG, No pitting edema on LE's bilaterally  Resp: LCTAB, no increased work of breathing Abd: Thin, soft, Nontender, PEG tub in place-dressing c/d/i  Extremities: Warm, dry with skin tenting bilaterally.  Right hand with notable flexor contractures.   Filed Weights   08/31/24 1300 09/01/24 0500  Weight: 34.4 kg 38.7 kg     Intake/Output Summary (Last 24 hours) at 09/02/2024 1440 Last data filed at 09/02/2024 1033 Gross per 24 hour  Intake 300 ml  Output 950 ml  Net -650 ml   Net IO Since Admission: -970 mL [09/02/24 1440]  Pertinent Labs:    Latest Ref Rng & Units 09/02/2024    4:13 AM 09/01/2024   10:27 AM 08/31/2024    5:37 AM  CBC  WBC 4.0 - 10.5 K/uL 4.4  5.3  5.2   Hemoglobin 12.0 - 15.0 g/dL 9.4  88.7  89.4   Hematocrit 36.0 - 46.0 % 28.0  33.6  31.1   Platelets 150 - 400 K/uL 179  200  201        Latest Ref Rng & Units 09/02/2024    4:13 AM 09/01/2024   10:27 AM 08/31/2024    5:37 AM  CMP  Glucose 70 - 99 mg/dL 873  92  73   BUN 6 - 20 mg/dL 15  11  14     Creatinine 0.44 - 1.00 mg/dL 8.84  8.91  8.97   Sodium 135 - 145 mmol/L 137  139  139   Potassium 3.5 - 5.1 mmol/L 4.4  4.2  3.7   Chloride 98 - 111 mmol/L 102  104  105   CO2 22 - 32 mmol/L 27  24  25    Calcium  8.9 - 10.3 mg/dL 8.9  9.7  9.4      Imaging: DG Swallowing Func-Speech Pathology Result Date: 09/02/2024 Table formatting from the original result was not included. Modified Barium Swallow Study Patient Details Name: Lavilla Delamora MRN: [id number] Date of Birth: 1971-02-19 Today's Date: 09/02/2024 HPI/PMH: HPI: Derrica Sieg is a 53 y.o. female who presented after her PEG tube became dislodged. Pt has a history of prior bilateral ischemic stroke in 2022 with sequelae of dysphagia requiring PEG tube and weakness. She is still minimally verbal and bed bound.   Pts sister reports pt relies on PEG tube feedings, but also drinks  thin liquids from a cup with a straw and also creamy purees from a blender. Sister feels she enjoys this and tolerates it well. She would like for pt to be offered these while admitted.  Pt was previously seen by SLP in 2023, also consuming purees and thin liquids, primary problem was oral dysphagia. Clinical Impression: Pt has an oropharyngeal dysphagia with intermittent aspiration that is silent. Orally her mastication is a little limited by mandibular ROM but overall her oral transit is typically swift and fully effective. With solids it can be a little slower, and there is very little residue that she clears with a second swallow. Pharyngeally, part of her function is impacted by pt positioning with pharynx positioned on more of a horizontal plane. When she has mistiming of her swallow, this results in thin and nectar thick liquids spilling into her laryngeal vestibule. She has incomplete laryngeal vestibule closure and liquids intermittently spill in small amounts below the airway  (PAS 8, silent aspiration). This is consistently cleared with a cued cough, despite the  cough sounding very weak. She also tends to have penetration more than aspiration when given help to pace and take small, single sips at a time. It wasn't until she was at consistencies as thick as honey thick liquids and purees/solids that she did not have barium enter her airway. Given that esophageal sweep was clear, question if this intermittent aspiration is contributing to the delayed cough noted at bedside. Given that her cough was consistent in clearing aspirates on this study, and that there is no documented concern for PNA, it would be reasonable to continue on thin liquids with careful assistance during meals and help with positioning. This does appear to be a change compared to previous MBS though and could be attributed to some of her more acute deconditioning, so could consider a trial of dysphagia therapy. Will definitely f/u to try to provide education with family. Factors that may increase risk of adverse event in presence of aspiration Noe & Lianne 2021): Factors that may increase risk of adverse event in presence of aspiration Noe & Lianne 2021): Poor general health and/or compromised immunity; Reduced cognitive function; Frail or deconditioned; Limited mobility; Dependence for feeding and/or oral hygiene; Presence of tubes (ETT, trach, NG, etc.); Aspiration of thick, dense, and/or acidic materials Recommendations/Plan: Swallowing Evaluation Recommendations Swallowing Evaluation Recommendations Recommendations: PO diet PO Diet Recommendation: Dysphagia 3 (Mechanical soft); Thin liquids (Level 0) Liquid Administration via: Straw Medication Administration: Whole meds with puree Supervision: Staff to assist with self-feeding; Full supervision/cueing for swallowing strategies Swallowing strategies  : Minimize environmental distractions; Slow rate; Small bites/sips; Hard cough after swallowing Postural changes: Position pt fully upright for meals Oral care recommendations: Oral care BID  (2x/day) Treatment Plan Treatment Plan Treatment recommendations: Therapy as outlined in treatment plan below Follow-up recommendations: Skilled nursing-short term rehab (<3 hours/day) Functional status assessment: Patient has had a recent decline in their functional status and demonstrates the ability to make significant improvements in function in a reasonable and predictable amount of time. Treatment frequency: Min 2x/week Treatment duration: 2 weeks Interventions: Aspiration precaution training; Patient/family education; Trials of upgraded texture/liquids; Diet toleration management by SLP Recommendations Recommendations for follow up therapy are one component of a multi-disciplinary discharge planning process, led by the attending physician.  Recommendations may be updated based on patient status, additional functional criteria and insurance authorization. Assessment: Orofacial Exam: Orofacial Exam Oral Cavity - Dentition: Adequate natural dentition Anatomy: Anatomy: Suspected cervical osteophytes; Other (Comment) (posture  resulting in pharynx on more horizontal plane) Boluses Administered: Boluses Administered Boluses Administered: Thin liquids (Level 0); Mildly thick liquids (Level 2, nectar thick); Moderately thick liquids (Level 3, honey thick); Puree; Solid  Oral Impairment Domain: Oral Impairment Domain Lip Closure: No labial escape (limited visibility, appears to be [facility] outwardly) Tongue control during bolus hold: Not tested Bolus preparation/mastication: Slow prolonged chewing/mashing with complete recollection Bolus transport/lingual motion: Slow tongue motion Oral residue: Trace residue lining oral structures Location of oral residue : Tongue Initiation of pharyngeal swallow : Pyriform sinuses  Pharyngeal Impairment Domain: Pharyngeal Impairment Domain Soft palate elevation: No bolus between soft palate (SP)/pharyngeal wall (PW) (limited visibility, appears to be [facility]) Laryngeal elevation: Partial  superior movement of thyroid cartilage/partial approximation of arytenoids to epiglottic petiole Anterior hyoid excursion: Complete anterior movement (limited visibility, appears to be [facility]) Epiglottic movement: Complete inversion Laryngeal vestibule closure: Incomplete, narrow column air/contrast in laryngeal vestibule Pharyngeal stripping wave : Present - complete Pharyngeal contraction (A/P view only): N/A Pharyngoesophageal segment opening: Complete distension and complete duration, no obstruction of flow (limited visibility, appears to be [facility]) Tongue base retraction: No contrast between tongue base and posterior pharyngeal wall (PPW) Pharyngeal residue: Complete pharyngeal clearance Location of pharyngeal residue: N/A  Esophageal Impairment Domain: Esophageal Impairment Domain Esophageal clearance upright position: Complete clearance, esophageal coating Pill: No data recorded Penetration/Aspiration Scale Score: Penetration/Aspiration Scale Score 1.  Material does not enter airway: Moderately thick liquids (Level 3, honey thick); Puree; Solid 8.  Material enters airway, passes BELOW cords without attempt by patient to eject out (silent aspiration) : Thin liquids (Level 0); Mildly thick liquids (Level 2, nectar thick) Compensatory Strategies: Compensatory Strategies Compensatory strategies: Yes Straw: Effective Effective Straw: Thin liquid (Level 0) Other(comment): Effective (single/smaller sips, hard cough after swallow) Effective Other(comment): Thin liquid (Level 0)   General Information: Caregiver present: No  Diet Prior to this Study: Regular; Thin liquids (Level 0)   Temperature : Normal   Respiratory Status: WFL   Supplemental O2: None (Room air)   History of Recent Intubation: No  Behavior/Cognition: Alert; Cooperative Self-Feeding Abilities: Dependent for feeding Baseline vocal quality/speech: Not observed Volitional Cough: Able to elicit Volitional Swallow: Able to elicit Exam Limitations: Poor  positioning; Limited visibility Goal Planning: Prognosis for improved oropharyngeal function: Fair Barriers to Reach Goals: Cognitive deficits No data recorded Patient/Family Stated Goal: none stated Consulted and agree with results and recommendations: Patient Pain: Pain Assessment Pain Assessment: Faces Faces Pain Scale: 0 Breathing: 0 Negative Vocalization: 0 Facial Expression: 0 Body Language: 0 Consolability: 0 PAINAD Score: 0 Pain Intervention(s): Monitored during session; Repositioned End of Session: Start Time:SLP Start Time (ACUTE ONLY): 0932 Stop Time: SLP Stop Time (ACUTE ONLY): 0945 Time Calculation:SLP Time Calculation (min) (ACUTE ONLY): 13 min Charges: SLP Evaluations $ SLP Speech Visit: 1 Visit SLP Evaluations $BSS Swallow: 1 Procedure $MBS Swallow: 1 Procedure $Swallowing Treatment: 1 Procedure SLP visit diagnosis: SLP Visit Diagnosis: Dysphagia, oropharyngeal phase (R13.12) Past Medical History: Past Medical History: Diagnosis Date  Hypertension   Hypertensive urgency 01/20/2022  Seizure (HCC) 01/28/2022  Stroke (HCC)   TIA (transient ischemic attack) 2019  Urinary tract infection 01/28/2022 Past Surgical History: Past Surgical History: Procedure Laterality Date  ESOPHAGOGASTRODUODENOSCOPY N/A 01/28/2022  Procedure: ESOPHAGOGASTRODUODENOSCOPY (EGD);  Surgeon: Paola Dreama SAILOR, MD;  Location: [facility] ENDOSCOPY;  Service: General;  Laterality: N/A;  IR GASTROSTOMY TUBE MOD SED  08/29/2022  IR REPLC GASTRO/COLONIC TUBE PERCUT W/FLUORO  08/31/2024  PEG PLACEMENT N/A 01/28/2022  Procedure: PERCUTANEOUS ENDOSCOPIC GASTROSTOMY (  PEG) PLACEMENT;  Surgeon: Paola Dreama SAILOR, MD;  Location: [facility] ENDOSCOPY;  Service: General;  Laterality: N/A; Leita SAILOR., M.A. CCC-SLP Acute Rehabilitation Services Office: [phone] Secure chat preferred\ 09/02/2024, 11:30 AM   Assessment/Plan:   Principal Problem:   PEG tube malfunction (HCC) Active Problems:   Acute bilateral thalamic, midbrain and bilateral cerebral ischemic  CVA   HTN (hypertension)   CKD (chronic kidney disease)   Chronic indwelling Foley catheter   Malnutrition of moderate degree   Physical deconditioning   Severe protein-calorie malnutrition   Pressure injury of skin   Patient Summary: Malgorzata Albert is a 53 y.o. person living with a history of prior bilateral ischemic stroke in 2022 with sequelae of dysphagia requiring PEG tube and weakness, HTN, HLD, chronic indwelling foley, and debility who presented after her PEG tube became dislodged, and admitted for PEG tube replacement   #PEG Tube Dislodgement #Dysphagia 2/2 Ischemic Stroke #Malnutrition -S/p PEG tube replacement per IR 11/18.  PEG tube stable and ready for use. - Modified barium swallow performed this morning which showed silent aspiration and oropharyngeal dysphagia.  She was cleared by speech therapy for thin liquids with proper positioning and close supervision. - At this time do not believe patient is getting nutritional value from oral that she was consuming very little of her meals and drinks.  She is also becoming hypotensive which I suspect is due to hypovolemia due to poor water  intake. -Discussed with nutrition today who suggested 100% supplementation of nutritional needs through tube feeding and bolus feeds 4 times per day.  Will also increase free water  flushes to help with volume status.  #Chronic Deconditioning  #Hx of CVA The patient was evaluated by PT and OT today who reported that she is more ambulatory than people may believe.  She can transfer with moderate assistance to the wheelchair.  She would benefit greatly from their perspective with SNF and therapy directed at increasing strength and mobility. Will continue to discharge goals with the family and patient however at this time plan to discharge to SNF.  #Chronic Indwelling Catheter  Exchanged 11/18, no suprapubic pain or signs of infection  #HTN -Continuing her home medications of amlodipine  10mg  and  metoprolol  -Patient became hypotensive late last evening.  Suspect there is a component of hypovolemia.  She responded well to 500 cc bolus -Initiate maintenance fluids and discussed with RT who will increase free water  flushes.  #Hx of CVA  #HLD  Continuing statin and aspirin    #Hx of Depression  Restarting sertraline   #Elevated Creatinine  Monitoring    Diet: Normal IVF: None VTE: Enoxaparin  Code: Full PT/OT recs: Pending, Pending  TOC recs: Pending Family Update: Bedside  Dispo: Anticipated discharge to Rehab in 2 days pending medical management.   Schuyler Novak, DO Internal Medicine Teaching Service, PGY-1 Please call on-call pager [phone]

## 2024-09-03 DIAGNOSIS — R5381 Other malaise: Secondary | ICD-10-CM | POA: Diagnosis not present

## 2024-09-03 DIAGNOSIS — E46 Unspecified protein-calorie malnutrition: Secondary | ICD-10-CM | POA: Diagnosis not present

## 2024-09-03 DIAGNOSIS — K9423 Gastrostomy malfunction: Secondary | ICD-10-CM | POA: Diagnosis not present

## 2024-09-03 DIAGNOSIS — I69321 Dysphasia following cerebral infarction: Secondary | ICD-10-CM | POA: Diagnosis not present

## 2024-09-03 LAB — CBC
HCT: 29.3 % — ABNORMAL LOW (ref 36.0–46.0)
Hemoglobin: 9.7 g/dL — ABNORMAL LOW (ref 12.0–15.0)
MCH: 28.5 pg (ref 26.0–34.0)
MCHC: 33.1 g/dL (ref 30.0–36.0)
MCV: 86.2 fL (ref 80.0–100.0)
Platelets: 178 K/uL (ref 150–400)
RBC: 3.4 MIL/uL — ABNORMAL LOW (ref 3.87–5.11)
RDW: 14.2 % (ref 11.5–15.5)
WBC: 4.6 K/uL (ref 4.0–10.5)
nRBC: 0 % (ref 0.0–0.2)

## 2024-09-03 LAB — BASIC METABOLIC PANEL WITH GFR
Anion gap: 9 (ref 5–15)
BUN: 16 mg/dL (ref 6–20)
CO2: 26 mmol/L (ref 22–32)
Calcium: 8.8 mg/dL — ABNORMAL LOW (ref 8.9–10.3)
Chloride: 102 mmol/L (ref 98–111)
Creatinine, Ser: 0.88 mg/dL (ref 0.44–1.00)
GFR, Estimated: >60 mL/min (ref >60)
Glucose, Bld: 89 mg/dL (ref 70–99)
Potassium: 4.2 mmol/L (ref 3.5–5.1)
Sodium: 137 mmol/L (ref 135–145)

## 2024-09-03 LAB — GLUCOSE, CAPILLARY
Glucose-Capillary: 89 mg/dL (ref 70–99)
Glucose-Capillary: 80 mg/dL (ref 70–99)
Glucose-Capillary: 113 mg/dL — ABNORMAL HIGH (ref 70–99)
Glucose-Capillary: 73 mg/dL (ref 70–99)
Glucose-Capillary: 110 mg/dL — ABNORMAL HIGH (ref 70–99)

## 2024-09-03 LAB — PHOSPHORUS: Phosphorus: 2.7 mg/dL (ref 2.5–4.6)

## 2024-09-03 LAB — MAGNESIUM: Magnesium: 1.9 mg/dL (ref 1.7–2.4)

## 2024-09-03 MED ORDER — LACTATED RINGERS IV BOLUS
500.0000 mL | Freq: Once | INTRAVENOUS | Status: AC
  Administered 2024-09-03: 500 mL via INTRAVENOUS

## 2024-09-03 NOTE — Progress Notes (Signed)
 Subjective:   Summary: Cindy Brooks is a 53 y.o. year old female currently admitted on HD4 for PEG tube dislodgement and chronic deconditioning.  Overnight Events: No acute events overnight  Patient seen and examined at the bedside this morning.  She was sleeping comfortably upon entering the room.  She did verbalize good morning Debby and was interactive in the conversation with head shaking and head nodding.  She denies any discomfort at this time.  Objective:  Vital signs in last 24 hours: Vitals:   09/02/24 1606 09/02/24 2000 09/03/24 0551 09/03/24 0727  BP: (!) 82/62 95/72 97/67  (!) 86/65  Pulse: 64 78 62 69  Resp: 16 17 18 18   Temp: 98.4 F (36.9 C) 99.1 F (37.3 C) 98.2 F (36.8 C) 98.6 F (37 C)  TempSrc: Oral Oral Oral   SpO2: 100% 97% 100% 100%  Weight:      Height:       Supplemental O2: Room Air SpO2: 100 %   Physical Exam:  Const: Awake, alert in NAD HENT: Normocephalic, atraumatic, mucus membranes moist Card: RRR, No MRG, No pitting edema on LE's bilaterally  Resp: LCTAB, no increased work of breathing Abd: Thin, soft, Nontender, PEG tub in place-dressing c/d/i  Extremities: Warm, dry with skin tenting bilaterally.  Right hand with notable flexor contractures.   Filed Weights   08/31/24 1300 09/01/24 0500  Weight: 34.4 kg 38.7 kg     Intake/Output Summary (Last 24 hours) at 09/03/2024 1139 Last data filed at 09/03/2024 0925 Gross per 24 hour  Intake 635 ml  Output 800 ml  Net -165 ml   Net IO Since Admission: -1,135 mL [09/03/24 1139]  Pertinent Labs:    Latest Ref Rng & Units 09/03/2024    5:03 AM 09/02/2024    4:13 AM 09/01/2024   10:27 AM  CBC  WBC 4.0 - 10.5 K/uL 4.6  4.4  5.3   Hemoglobin 12.0 - 15.0 g/dL 9.7  9.4  88.7   Hematocrit 36.0 - 46.0 % 29.3  28.0  33.6   Platelets 150 - 400 K/uL 178  179  200        Latest Ref Rng & Units 09/03/2024    5:03 AM 09/02/2024    4:13 AM 09/01/2024   10:27 AM  CMP   Glucose 70 - 99 mg/dL 89  873  92   BUN 6 - 20 mg/dL 16  15  11    Creatinine 0.44 - 1.00 mg/dL 9.11  8.84  8.91   Sodium 135 - 145 mmol/L 137  137  139   Potassium 3.5 - 5.1 mmol/L 4.2  4.4  4.2   Chloride 98 - 111 mmol/L 102  102  104   CO2 22 - 32 mmol/L 26  27  24    Calcium  8.9 - 10.3 mg/dL 8.8  8.9  9.7      Imaging: No results found.   Assessment/Plan:   Principal Problem:   PEG tube malfunction (HCC) Active Problems:   Acute bilateral thalamic, midbrain and bilateral cerebral ischemic CVA   HTN (hypertension)   CKD (chronic kidney disease)   Chronic indwelling Foley catheter   Malnutrition of moderate degree   Physical deconditioning   Severe protein-calorie malnutrition   Pressure injury of skin   Patient Summary: Cindy Brooks is a 53 y.o. person living with a history of prior bilateral ischemic stroke in 2022  with sequelae of dysphagia requiring PEG tube and weakness, HTN, HLD, chronic indwelling foley, and debility who presented after her PEG tube became dislodged, and admitted for PEG tube replacement   #PEG Tube Dislodgement #Dysphagia 2/2 Ischemic Stroke #Malnutrition -S/p PEG tube replacement per IR 11/18.  PEG tube stable and ready for use. - MBS performed which cleared patient for dysphagia diet with thin liquids. - The patient continues to have poor oral intake and is receiving 100% nutritional supplement through PEG tube feedings.  She does seem to handling this change well and without GI distress. - From this standpoint, patient is medically stable for discharge  #Chronic Deconditioning  #Hx of CVA -The patient is minimally ambulatory at home but can transfer to chair and wheelchair.  She was evaluated by PT and OT who recommended SNF and the patient is agreeable upon our conversation this morning. - Appreciate social work's assistance in getting SNF placement  #Chronic Indwelling Catheter  Exchanged 11/18, no suprapubic pain or signs of  infection  #HTN - The patient has been hypotensive intermittently over the last few days.  This morning her blood pressure was 86/65 with a MAP of 73. - Will hold antihypertensives including amlodipine  and metoprolol  at this point - Suspect this is secondary to hypovolemia, free water  flushes increased yesterday, will supplement with IV fluids as necessary.  #Hx of CVA  #HLD  Continuing statin and aspirin    #Hx of Depression  Continue sertraline   #Elevated Creatinine  Monitoring    Diet: Normal IVF: None VTE: Enoxaparin  Code: Full PT/OT recs: Pending, Pending  TOC recs: Pending Family Update: Bedside  Dispo: Anticipated discharge to Rehab in 2 days pending medical management.   Schuyler Novak, DO Internal Medicine Teaching Service, PGY-1 Please call on-call pager [phone]

## 2024-09-03 NOTE — Plan of Care (Signed)

## 2024-09-03 NOTE — Plan of Care (Signed)

## 2024-09-03 NOTE — TOC Progression Note (Addendum)
 Transition of Care [facility]) - Progression Note    Patient Details  Name: Cindy Brooks MRN: [id number] Date of Birth: 06/15/1971  [facility] of [facility] CM/SW Contact  Lauraine FORBES Saa, LCSWA Phone Number: 09/03/2024, 2:48 PM  Clinical Narrative:     2:49 PM CSW provided patient's daughter, Keyona with additional SNF bed offers ([facility], [facility]) and inquired about SNF decision. [name] expressed interest in reviewing SNF options with patient tomorrow on her day off to confirm disposition with patient. CSW informed Keyona that per resident DO, patient is medically stable for discharge. Keyona expressed understanding of the information. [name] informed CSW that patient receives SSI. CSW updated resident DO. CSW will continue to follow.  Expected Discharge Plan: Skilled Nursing Facility Barriers to Discharge: Continued Medical Work up, English As A Second Language Teacher, SNF Pending bed offer               Expected Discharge Plan and Services In-house Referral: Clinical Social Work   Post Acute Care Choice: Skilled Nursing Facility Living arrangements for the past 2 months: Single Family Home                                       Social Drivers of Health (SDOH) Interventions SDOH Screenings   Food Insecurity: No Food Insecurity (08/31/2024)  Housing: Low Risk  (08/31/2024)  Transportation Needs: No Transportation Needs (08/31/2024)  Utilities: Not At Risk (08/31/2024)  Depression (PHQ2-9): High Risk (05/14/2022)  Tobacco Use: Low Risk  (08/26/2024)    Readmission Risk Interventions    09/02/2022    2:23 PM 05/28/2022    2:27 PM 03/13/2022    9:03 AM  Readmission Risk Prevention Plan  Transportation Screening Complete Complete Complete  PCP or Specialist Appt within 5-7 Days   Complete  PCP or Specialist Appt within 3-5 Days  Complete   Home Care Screening   Complete  Medication Review (RN CM)   Complete  HRI or Home Care Consult  Complete    Social Work Consult for Recovery Care Planning/Counseling  Complete   Palliative Care Screening  Not Applicable   Medication Review Oceanographer) Complete Complete   PCP or Specialist appointment within 3-5 days of discharge Complete    HRI or Home Care Consult Complete    SW Recovery Care/Counseling Consult Complete    Palliative Care Screening Complete    Skilled Nursing Facility Not Applicable

## 2024-09-03 NOTE — Progress Notes (Signed)
 Speech Language Pathology Treatment: Dysphagia  Patient Details Name: Cindy Brooks MRN: [id number] DOB: 03-Apr-1971 Today's Date: 09/03/2024 Time: 8775-8763 SLP Time Calculation (min) (ACUTE ONLY): 12 min  Assessment / Plan / Recommendation Clinical Impression  Pt was seen for PO trials and was given education about strategies recommended from MBS on previous date. Pt implemented two main strategies (small, single sips as well as cough after swallow) with Min additional cues from SLP. Again note that her cough sounds very weak, but it did on MBS and it was still effective at clearing all penetrates and aspirates from her airway.   PLAN: As long as pt is able to continue to use these strategies with support, recommend continuing Dys 3 diet and thin liquids. If she were to start to have difficulty implementing strategies, honey thick liquids would be an alternative option. Nursing was educated and sign was replaced at [facility].    HPI HPI: Cindy Brooks is a 53 y.o. female who presented after her PEG tube became dislodged. Pt has a history of prior bilateral ischemic stroke in 2022 with sequelae of dysphagia requiring PEG tube and weakness. She is still minimally verbal and bed bound.   Pts sister reports pt relies on PEG tube feedings, but also drinks thin liquids from a cup with a straw and also creamy purees from a blender. Sister feels she enjoys this and tolerates it well. She would like for pt to be offered these while admitted.  Pt was previously seen by SLP in 2023, also consuming purees and thin liquids, primary problem was oral dysphagia.      SLP Plan  Continue with current plan of care          Recommendations  Diet recommendations: Dysphagia 3 (mechanical soft);Thin liquid Liquids provided via: Straw Medication Administration: Whole meds with puree Supervision: Staff to assist with self feeding;Full supervision/cueing for compensatory strategies Compensations: Slow rate;Small  sips/bites;Hard cough after swallow;Other (Comment) (one small sip at a time) Postural Changes and/or Swallow Maneuvers: Seated upright 90 degrees                  Oral care BID     Dysphagia, oropharyngeal phase (R13.12)     Continue with current plan of care     Cindy Brooks., M.A. CCC-SLP Acute Rehabilitation Services Office: [phone]  Secure chat preferred   09/03/2024, 1:09 PM

## 2024-09-04 DIAGNOSIS — K9423 Gastrostomy malfunction: Secondary | ICD-10-CM | POA: Diagnosis not present

## 2024-09-04 DIAGNOSIS — I69321 Dysphasia following cerebral infarction: Secondary | ICD-10-CM | POA: Diagnosis not present

## 2024-09-04 DIAGNOSIS — Z7982 Long term (current) use of aspirin: Secondary | ICD-10-CM

## 2024-09-04 DIAGNOSIS — R7989 Other specified abnormal findings of blood chemistry: Secondary | ICD-10-CM

## 2024-09-04 DIAGNOSIS — R5381 Other malaise: Secondary | ICD-10-CM | POA: Diagnosis not present

## 2024-09-04 DIAGNOSIS — E785 Hyperlipidemia, unspecified: Secondary | ICD-10-CM

## 2024-09-04 DIAGNOSIS — E43 Unspecified severe protein-calorie malnutrition: Secondary | ICD-10-CM | POA: Diagnosis not present

## 2024-09-04 LAB — MAGNESIUM: Magnesium: 2 mg/dL (ref 1.7–2.4)

## 2024-09-04 LAB — GLUCOSE, CAPILLARY
Glucose-Capillary: 109 mg/dL — ABNORMAL HIGH (ref 70–99)
Glucose-Capillary: 149 mg/dL — ABNORMAL HIGH (ref 70–99)
Glucose-Capillary: 100 mg/dL — ABNORMAL HIGH (ref 70–99)

## 2024-09-04 LAB — BASIC METABOLIC PANEL WITH GFR
Anion gap: 8 (ref 5–15)
BUN: 16 mg/dL (ref 6–20)
CO2: 27 mmol/L (ref 22–32)
Calcium: 8.9 mg/dL (ref 8.9–10.3)
Chloride: 104 mmol/L (ref 98–111)
Creatinine, Ser: 0.88 mg/dL (ref 0.44–1.00)
GFR, Estimated: >60 mL/min (ref >60)
Glucose, Bld: 68 mg/dL — ABNORMAL LOW (ref 70–99)
Potassium: 4.6 mmol/L (ref 3.5–5.1)
Sodium: 139 mmol/L (ref 135–145)

## 2024-09-04 LAB — PHOSPHORUS: Phosphorus: 3.3 mg/dL (ref 2.5–4.6)

## 2024-09-04 NOTE — Plan of Care (Signed)

## 2024-09-04 NOTE — TOC Progression Note (Signed)
 Transition of Care [facility]) - Progression Note    Patient Details  Name: Cindy Brooks MRN: [id number] Date of Birth: 1971/09/05  Transition of Care [facility]) CM/SW Contact  Marval Gell, RN Phone Number: 09/04/2024, 3:10 PM  Clinical Narrative:      MD stating daughter could be interested in [facility] if no SNF options are agreeable for her. Sent out in [place] for [facility] options.   Expected Discharge Plan: Skilled Nursing Facility Barriers to Discharge: Continued Medical Work up, English As A Second Language Teacher, SNF Pending bed offer               Expected Discharge Plan and Services In-house Referral: Clinical Social Work   Post Acute Care Choice: Skilled Nursing Facility Living arrangements for the past 2 months: Single Family Home                                       Social Drivers of Health (SDOH) Interventions SDOH Screenings   Food Insecurity: No Food Insecurity (08/31/2024)  Housing: Low Risk  (08/31/2024)  Transportation Needs: No Transportation Needs (08/31/2024)  Utilities: Not At Risk (08/31/2024)  Depression (PHQ2-9): High Risk (05/14/2022)  Tobacco Use: Low Risk  (08/26/2024)    Readmission Risk Interventions    09/02/2022    2:23 PM 05/28/2022    2:27 PM 03/13/2022    9:03 AM  Readmission Risk Prevention Plan  Transportation Screening Complete Complete Complete  PCP or Specialist Appt within 5-7 Days   Complete  PCP or Specialist Appt within 3-5 Days  Complete   Home Care Screening   Complete  Medication Review (RN CM)   Complete  HRI or Home Care Consult  Complete   Social Work Consult for Recovery Care Planning/Counseling  Complete   Palliative Care Screening  Not Applicable   Medication Review Oceanographer) Complete Complete   PCP or Specialist appointment within 3-5 days of discharge Complete    HRI or Home Care Consult Complete    SW Recovery Care/Counseling Consult Complete    Palliative Care Screening Complete    Skilled Nursing Facility Not  Applicable

## 2024-09-04 NOTE — Progress Notes (Addendum)
 Subjective:   Summary: Cindy Brooks is a 53 y.o. year old female currently admitted on HD1 for PEG tube dislodgement and chronic deconditioning.  Overnight Events: No acute events overnight Examined at bedside today.  She is medically ready for discharge, pending the final decision regarding SNF placement. I spoke with the patient's POA at length this afternoon. She expressed dissatisfaction with the current SNF bed offers, noting that the facilities available are not in acceptable condition. She would like to explore whether the patient can instead be discharged home with home health PT/OT. Objective:  Vital signs in last 24 hours: Vitals:   09/03/24 1323 09/03/24 1642 09/03/24 1945 09/04/24 0736  BP: 90/63 90/64 90/67  106/78  Pulse:  62 67 (!) 54  Resp:  18 18 17   Temp:  98.4 F (36.9 C) 99 F (37.2 C) 97.7 F (36.5 C)  TempSrc:   Oral Oral  SpO2:  100% 100% 100%  Weight:      Height:       Supplemental O2: Room Air SpO2: 100 %   Physical Exam:  Const: Awake, alert in NAD HENT: Normocephalic, atraumatic, mucus membranes moist Card: RRR, No MRG, No pitting edema on LE's bilaterally  Resp: LCTAB, no increased work of breathing Abd: Thin, soft, Nontender, PEG tub in place-dressing c/d/i  Extremities: Warm, dry with skin tenting bilaterally.  Right hand with notable flexor contractures.   Filed Weights   08/31/24 1300 09/01/24 0500  Weight: 34.4 kg 38.7 kg     Intake/Output Summary (Last 24 hours) at 09/04/2024 1312 Last data filed at 09/04/2024 1211 Gross per 24 hour  Intake 1490 ml  Output 1250 ml  Net 240 ml   Net IO Since Admission: -760 mL [09/04/24 1312]  Pertinent Labs:    Latest Ref Rng & Units 09/03/2024    5:03 AM 09/02/2024    4:13 AM 09/01/2024   10:27 AM  CBC  WBC 4.0 - 10.5 K/uL 4.6  4.4  5.3   Hemoglobin 12.0 - 15.0 g/dL 9.7  9.4  88.7   Hematocrit 36.0 - 46.0 % 29.3  28.0  33.6   Platelets 150 - 400 K/uL 178  179  200         Latest Ref Rng & Units 09/04/2024    3:12 AM 09/03/2024    5:03 AM 09/02/2024    4:13 AM  CMP  Glucose 70 - 99 mg/dL 68  89  873   BUN 6 - 20 mg/dL 16  16  15    Creatinine 0.44 - 1.00 mg/dL 9.11  9.11  8.84   Sodium 135 - 145 mmol/L 139  137  137   Potassium 3.5 - 5.1 mmol/L 4.6  4.2  4.4   Chloride 98 - 111 mmol/L 104  102  102   CO2 22 - 32 mmol/L 27  26  27    Calcium  8.9 - 10.3 mg/dL 8.9  8.8  8.9      Imaging: No results found.   Assessment/Plan:   Principal Problem:   PEG tube malfunction (HCC) Active Problems:   Acute bilateral thalamic, midbrain and bilateral cerebral ischemic CVA   HTN (hypertension)   CKD (chronic kidney disease)   Chronic indwelling Foley catheter   Malnutrition of moderate degree   Physical deconditioning   Severe protein-calorie malnutrition   Pressure injury of skin   Patient Summary: Cindy Brooks is a  53 y.o. person living with a history of prior bilateral ischemic stroke in 2022 with sequelae of dysphagia requiring PEG tube and weakness, HTN, HLD, chronic indwelling foley, and debility who presented after her PEG tube became dislodged, and admitted for PEG tube replacement   #PEG Tube Dislodgement #Dysphagia 2/2 Ischemic Stroke #Malnutrition -S/p PEG tube replacement per IR 11/18.  PEG tube stable and ready for use. - Modified barium swallow performed this morning which showed silent aspiration and oropharyngeal dysphagia.  She was cleared by speech therapy for thin liquids with proper positioning and close supervision. - Overall her dysphagia improved, no episodes of hypoglycemia.  #Chronic Deconditioning  #Hx of CVA The patient was evaluated by PT and OT today who reported that she is more ambulatory than people may believe.  She can transfer with moderate assistance to the wheelchair.   - Will follow-up with social worker about SNF offers; daughter decided to make a decision on  #Chronic Indwelling Catheter  Exchanged  11/18, no suprapubic pain or signs of infection  #HTN - Discontinued amlodipine  and metoprolol  due to softer blood pressures.  #Hx of CVA  #HLD  Continuing statin and aspirin    #Hx of Depression  Continue sertraline   #Elevated Creatinine  Monitoring   Diet: Normal IVF: None VTE: Enoxaparin  Code: Full PT/OT recs: Pending, Pending  TOC recs: Pending Family Update: Bedside  Dispo: Anticipated discharge to Rehab in 2 days pending medical management.   Estephany Perot, M.D.  Internal Medicine Resident, PGY-2  1:16 PM, 09/04/2024   **Please contact the on call pager after 5 pm and on weekends at [phone].**

## 2024-09-05 DIAGNOSIS — R5381 Other malaise: Secondary | ICD-10-CM | POA: Diagnosis not present

## 2024-09-05 DIAGNOSIS — E43 Unspecified severe protein-calorie malnutrition: Secondary | ICD-10-CM | POA: Diagnosis not present

## 2024-09-05 DIAGNOSIS — K9423 Gastrostomy malfunction: Secondary | ICD-10-CM | POA: Diagnosis not present

## 2024-09-05 DIAGNOSIS — I69321 Dysphasia following cerebral infarction: Secondary | ICD-10-CM | POA: Diagnosis not present

## 2024-09-05 LAB — GLUCOSE, CAPILLARY
Glucose-Capillary: 106 mg/dL — ABNORMAL HIGH (ref 70–99)
Glucose-Capillary: 111 mg/dL — ABNORMAL HIGH (ref 70–99)
Glucose-Capillary: 124 mg/dL — ABNORMAL HIGH (ref 70–99)
Glucose-Capillary: 129 mg/dL — ABNORMAL HIGH (ref 70–99)
Glucose-Capillary: 131 mg/dL — ABNORMAL HIGH (ref 70–99)
Glucose-Capillary: 65 mg/dL — ABNORMAL LOW (ref 70–99)
Glucose-Capillary: 67 mg/dL — ABNORMAL LOW (ref 70–99)
Glucose-Capillary: 85 mg/dL (ref 70–99)
Glucose-Capillary: 93 mg/dL (ref 70–99)

## 2024-09-05 MED ORDER — FREE WATER
180.0000 mL | Freq: Four times a day (QID) | Status: DC
  Administered 2024-09-05 – 2024-09-06 (×5): 180 mL

## 2024-09-05 MED ORDER — POLYETHYLENE GLYCOL 3350 17 G PO PACK
17.0000 g | PACK | Freq: Every day | ORAL | Status: DC
  Administered 2024-09-05 – 2024-09-06 (×2): 17 g via ORAL
  Filled 2024-09-05 (×2): qty 1

## 2024-09-05 MED ORDER — SENNOSIDES-DOCUSATE SODIUM 8.6-50 MG PO TABS
1.0000 | ORAL_TABLET | Freq: Once | ORAL | Status: AC
  Administered 2024-09-05: 1 via ORAL
  Filled 2024-09-05: qty 1

## 2024-09-05 NOTE — Care Management (Addendum)
 Received call back from patient's daughter.  She was extremely rude, calling me crazy, rude, unprofessional and I had to end the call. I was able to explained that there were not any HH providers, she would like OP services at a [employer] office. I will referral to [facility] for OP PT.  She states that her mother is able to get in and out of a car with help and that she could have transportation set up for later today but wants to have [facility] instead. She became belligerent when I explained that if she is too ambulatory then [facility] may decline  ambulance transfer but I told her I would set it up and we would try. She verified she had all DME needed at home.  Updated providers, they acknowledged and will follow up.   14:35 handed off to Eileen Bathe RN CM

## 2024-09-05 NOTE — TOC Progression Note (Signed)
 Transition of Care [facility]) - Progression Note    Patient Details  Name: Cindy Brooks MRN: [id number] Date of Birth: [birth year]  Transition of Care [facility]) CM/SW Contact  Robynn Eileen Hoose, RN Phone Number: 09/05/2024, 2:45 PM  Clinical Narrative:   Attempt made to secure home health services with [facility], patient has history with them from 2023-Oct 2025. Per Baker with [facility], unable to accept pt due to pt current insurance is out of network.  Spoke with daughter, [name], confirmed that she has all needed tube feeding supplies at home along with Ensure and tube feeding. She did not remember the name of the tube feeding, but she does have enough available. Discussed that [facility] unable to accept patient for [facility] services.  Daughter concern is if patient is discharged how will she get her foley catheter changed when needed, pt does not currently have a urologist. Providers updated on recent conversations.    Expected Discharge Plan: Skilled Nursing Facility Barriers to Discharge: Continued Medical Work up, English As A Second Language Teacher, SNF Pending bed offer               Expected Discharge Plan and Services In-house Referral: Clinical Social Work   Post Acute Care Choice: Skilled Nursing Facility Living arrangements for the past 2 months: Single Family Home                                       Social Drivers of Health (SDOH) Interventions SDOH Screenings   Food Insecurity: No Food Insecurity (08/31/2024)  Housing: Low Risk  (08/31/2024)  Transportation Needs: No Transportation Needs (08/31/2024)  Utilities: Not At Risk (08/31/2024)  Depression (PHQ2-9): High Risk (05/14/2022)  Tobacco Use: Low Risk  (08/26/2024)    Readmission Risk Interventions    09/02/2022    2:23 PM 05/28/2022    2:27 PM 03/13/2022    9:03 AM  Readmission Risk Prevention Plan  Transportation Screening Complete Complete Complete  PCP or Specialist Appt within 5-7 Days   Complete   PCP or Specialist Appt within 3-5 Days  Complete   Home Care Screening   Complete  Medication Review (RN CM)   Complete  HRI or Home Care Consult  Complete   Social Work Consult for Recovery Care Planning/Counseling  Complete   Palliative Care Screening  Not Applicable   Medication Review Oceanographer) Complete Complete   PCP or Specialist appointment within 3-5 days of discharge Complete    HRI or Home Care Consult Complete    SW Recovery Care/Counseling Consult Complete    Palliative Care Screening Complete    Skilled Nursing Facility Not Applicable

## 2024-09-05 NOTE — TOC Progression Note (Signed)
 Transition of Care [facility]) - Progression Note    Patient Details  Name: Cindy Brooks MRN: [id number] Date of Birth: [birth year]  Transition of Care [facility]) CM/SW Contact  Marval Gell, RN Phone Number: 09/05/2024, 9:16 AM  Clinical Narrative:     No offers for [facility] in [place], tried to reach out to daughter, she answered on first call then hung up and let second call go to VM, I LVM will discuss HH if she calls back.   [place], Kreiser #1 (Daughter) [phone]   Expected Discharge Plan: Skilled Nursing Facility Barriers to Discharge: Continued Medical Work up, English As A Second Language Teacher, SNF Pending bed offer               Expected Discharge Plan and Services In-house Referral: Clinical Social Work   Post Acute Care Choice: Skilled Nursing Facility Living arrangements for the past 2 months: Single Family Home                                       Social Drivers of Health (SDOH) Interventions SDOH Screenings   Food Insecurity: No Food Insecurity (08/31/2024)  Housing: Low Risk  (08/31/2024)  Transportation Needs: No Transportation Needs (08/31/2024)  Utilities: Not At Risk (08/31/2024)  Depression (PHQ2-9): High Risk (05/14/2022)  Tobacco Use: Low Risk  (08/26/2024)    Readmission Risk Interventions    09/02/2022    2:23 PM 05/28/2022    2:27 PM 03/13/2022    9:03 AM  Readmission Risk Prevention Plan  Transportation Screening Complete Complete Complete  PCP or Specialist Appt within 5-7 Days   Complete  PCP or Specialist Appt within 3-5 Days  Complete   Home Care Screening   Complete  Medication Review (RN CM)   Complete  HRI or Home Care Consult  Complete   Social Work Consult for Recovery Care Planning/Counseling  Complete   Palliative Care Screening  Not Applicable   Medication Review Oceanographer) Complete Complete   PCP or Specialist appointment within 3-5 days of discharge Complete    HRI or Home Care Consult Complete    SW Recovery  Care/Counseling Consult Complete    Palliative Care Screening Complete    Skilled Nursing Facility Not Applicable

## 2024-09-05 NOTE — Progress Notes (Signed)
 Subjective:   Summary: Cindy Brooks is a 53 y.o. year old female currently admitted on HD4 for PEG tube dislodgement and chronic deconditioning.  Overnight Events: No acute events overnight  Patient seen and examined at the bedside this morning. She is feeling okay overall but reports that she is uncomfortable in bed. She is awake and pleasantly interactive with the team.   Objective:  Vital signs in last 24 hours: Vitals:   09/04/24 1600 09/04/24 2131 09/05/24 0355 09/05/24 0746  BP: (!) 87/67 (!) 86/71 107/78 99/68  Pulse: 90 92 65 70  Resp: 16 16 14    Temp: 98.1 F (36.7 C) 98.3 F (36.8 C) (!) 97.5 F (36.4 C) 98.3 F (36.8 C)  TempSrc: Oral Oral Axillary Oral  SpO2: 100% 100% 98% 100%  Weight:      Height:       Supplemental O2: Room Air SpO2: 100 % (Simultaneous filing. User may not have seen previous data.)   Physical Exam:  Const: Awake, alert in NAD HENT: Normocephalic, atraumatic, mucus membranes moist Card: RRR, No MRG, No pitting edema on LE's bilaterally  Resp: LCTAB, no increased work of breathing Abd: Thin, soft, Nontender, PEG tub in place-dressing c/d/i  Extremities: Warm, dry with skin tenting bilaterally. Right hand with notable flexor contractures.   Filed Weights   08/31/24 1300 09/01/24 0500  Weight: 34.4 kg 38.7 kg     Intake/Output Summary (Last 24 hours) at 09/05/2024 0900 Last data filed at 09/05/2024 0400 Gross per 24 hour  Intake 855 ml  Output 650 ml  Net 205 ml   Net IO Since Admission: -750 mL [09/05/24 0900]  Pertinent Labs:    Latest Ref Rng & Units 09/03/2024    5:03 AM 09/02/2024    4:13 AM 09/01/2024   10:27 AM  CBC  WBC 4.0 - 10.5 K/uL 4.6  4.4  5.3   Hemoglobin 12.0 - 15.0 g/dL 9.7  9.4  88.7   Hematocrit 36.0 - 46.0 % 29.3  28.0  33.6   Platelets 150 - 400 K/uL 178  179  200        Latest Ref Rng & Units 09/04/2024    3:12 AM 09/03/2024    5:03 AM 09/02/2024    4:13 AM  CMP  Glucose  70 - 99 mg/dL 68  89  873   BUN 6 - 20 mg/dL 16  16  15    Creatinine 0.44 - 1.00 mg/dL 9.11  9.11  8.84   Sodium 135 - 145 mmol/L 139  137  137   Potassium 3.5 - 5.1 mmol/L 4.6  4.2  4.4   Chloride 98 - 111 mmol/L 104  102  102   CO2 22 - 32 mmol/L 27  26  27    Calcium  8.9 - 10.3 mg/dL 8.9  8.8  8.9      Imaging: No results found.   Assessment/Plan:   Principal Problem:   PEG tube malfunction (HCC) Active Problems:   Acute bilateral thalamic, midbrain and bilateral cerebral ischemic CVA   HTN (hypertension)   CKD (chronic kidney disease)   Chronic indwelling Foley catheter   Malnutrition of moderate degree   Physical deconditioning   Severe protein-calorie malnutrition   Pressure injury of skin   Patient Summary: Cindy Brooks is a 53 y.o. person living with a history of prior bilateral ischemic stroke in 2022 with sequelae of  dysphagia requiring PEG tube and weakness, HTN, HLD, chronic indwelling foley, and debility who presented after her PEG tube became dislodged, and admitted for PEG tube replacement   #PEG Tube Dislodgement #Dysphagia 2/2 Ischemic Stroke #Malnutrition -S/p PEG tube replacement per IR 11/18.  PEG tube stable and ready for use. - MBS performed which cleared patient for dysphagia diet with thin liquids. - The patient continues to have poor oral intake and is receiving 100% nutritional supplement through PEG tube feedings.  She does seem to handling this change well and without GI distress. - Patient remains medically stable for discharge, awaiting SNF placement.   #Chronic Deconditioning  #Hx of CVA -The patient is minimally ambulatory at home but can transfer to chair and wheelchair.  She was evaluated by PT and OT who recommended SNF. -Ongoing conversations with the patient and family about SNF vs. HH. Will need to make a final decision soon as the patient is medically stable for discharge.  - Appreciate social work's assistance.  #Chronic  Indwelling Catheter  Exchanged 11/18, no suprapubic pain or signs of infection  #HTN - Pressures continue to be soft. -Hold home antihypertensives -Will increase free water  flushes, as this is likely due to hypovolemia.   #Hx of CVA  #HLD  Continuing statin and aspirin    #Hx of Depression  Continue sertraline   #Elevated Creatinine  Monitoring    Diet: Normal IVF: None VTE: Enoxaparin  Code: Full PT/OT recs: Pending, Pending  TOC recs: Pending Family Update: Bedside  Dispo: Anticipated discharge to Rehab in 2 days pending medical management.   Schuyler Novak, DO Internal Medicine Teaching Service, PGY-1 Please call on-call pager [phone]

## 2024-09-05 NOTE — Progress Notes (Signed)
 Unable to weigh pt . Bed scale is not responding

## 2024-09-05 NOTE — Progress Notes (Signed)
 Nutrition Brief Note  MD reached out to RD via secure chat about increasing free water  flushes due to ongoing hypotension, Patient is being followed by RD. Last seen 11/20 when tube feeds were increased to meet 100% estimated calorie needs. Free water  flushes were also increased at that time.  Patient continues to have minimal/no PO intake. Will increase free water  flushes as below.   INTERVENTION:  Continue tube feeds via PEG tube: 1 carton Osmolite 1.5 (237 mL) - QID between meal times Administer slowly over 15-20 minutes via gravity Increase Free water  flushes: 30 mL before and after each bolus feed + 180 mL between feeds x 4  Regimen at goal provides 1422 kcal, 60 gm protein, and 1682 mL free water  daily.   Diet per SLP  Encourage PO as able   Trude Ned RD, LDN Contact via Secure Chat.

## 2024-09-06 ENCOUNTER — Other Ambulatory Visit (HOSPITAL_COMMUNITY): Payer: Self-pay

## 2024-09-06 DIAGNOSIS — Z8673 Personal history of transient ischemic attack (TIA), and cerebral infarction without residual deficits: Secondary | ICD-10-CM | POA: Diagnosis not present

## 2024-09-06 DIAGNOSIS — K9423 Gastrostomy malfunction: Secondary | ICD-10-CM | POA: Diagnosis not present

## 2024-09-06 DIAGNOSIS — E43 Unspecified severe protein-calorie malnutrition: Secondary | ICD-10-CM | POA: Diagnosis not present

## 2024-09-06 DIAGNOSIS — Z960 Presence of urogenital implants: Secondary | ICD-10-CM | POA: Diagnosis not present

## 2024-09-06 LAB — BASIC METABOLIC PANEL WITH GFR
Anion gap: 8 (ref 5–15)
BUN: 19 mg/dL (ref 6–20)
CO2: 28 mmol/L (ref 22–32)
Calcium: 9 mg/dL (ref 8.9–10.3)
Chloride: 102 mmol/L (ref 98–111)
Creatinine, Ser: 1.03 mg/dL — ABNORMAL HIGH (ref 0.44–1.00)
GFR, Estimated: >60 mL/min (ref >60)
Glucose, Bld: 59 mg/dL — ABNORMAL LOW (ref 70–99)
Potassium: 4.4 mmol/L (ref 3.5–5.1)
Sodium: 138 mmol/L (ref 135–145)

## 2024-09-06 LAB — GLUCOSE, CAPILLARY
Glucose-Capillary: 75 mg/dL (ref 70–99)
Glucose-Capillary: 65 mg/dL — ABNORMAL LOW (ref 70–99)
Glucose-Capillary: 112 mg/dL — ABNORMAL HIGH (ref 70–99)
Glucose-Capillary: 108 mg/dL — ABNORMAL HIGH (ref 70–99)

## 2024-09-06 LAB — PHOSPHORUS: Phosphorus: 4 mg/dL (ref 2.5–4.6)

## 2024-09-06 LAB — MAGNESIUM: Magnesium: 2 mg/dL (ref 1.7–2.4)

## 2024-09-06 MED ORDER — OSMOLITE 1.5 CAL PO LIQD
237.0000 mL | Freq: Four times a day (QID) | ORAL | 0 refills | Status: AC
  Filled 2024-09-06: qty 28440, 30d supply, fill #0

## 2024-09-06 MED ORDER — FREE WATER
60.0000 mL | Freq: Four times a day (QID) | 0 refills | Status: AC
  Filled 2024-09-06: qty 7200, 30d supply, fill #0

## 2024-09-06 MED ORDER — FREE WATER
180.0000 mL | Freq: Four times a day (QID) | 0 refills | Status: AC
  Filled 2024-09-06: qty 21600, 30d supply, fill #0

## 2024-09-06 MED ORDER — ATORVASTATIN CALCIUM 80 MG PO TABS
80.0000 mg | ORAL_TABLET | Freq: Every day | ORAL | 0 refills | Status: AC
  Filled 2024-09-06: qty 90, 90d supply, fill #0

## 2024-09-06 MED ORDER — ASPIRIN 81 MG PO CHEW
81.0000 mg | CHEWABLE_TABLET | Freq: Every day | ORAL | 0 refills | Status: AC
  Filled 2024-09-06: qty 30, 30d supply, fill #0

## 2024-09-06 NOTE — Plan of Care (Signed)

## 2024-09-06 NOTE — Progress Notes (Signed)
 Reviewed  AVS with daughter over the phone and AVS in packet for transport

## 2024-09-06 NOTE — Progress Notes (Signed)
 SLP Cancellation Note  Patient Details Name: Cindy Brooks MRN: [id number] DOB: Mar 10, 1971   Cancelled treatment:       Reason Eval/Treat Not Completed: Other (comment). Called Keyona Banfill to relay safe swallowing recommendations for pt prior to d/c. No response. Recommendations posted in room should go home with pt if possible.    Doreather Hoxworth, [name] 09/06/2024, 3:25 PM

## 2024-09-06 NOTE — Progress Notes (Signed)
 Physical Therapy Treatment Patient Details Name: Cindy Brooks MRN: [id number] DOB: 1970/10/18 Today's Date: 09/06/2024   History of Present Illness Cindy Brooks is a 53 y.o. year old female admitted 08/30/24 for PEG tube dislodgement and generalized weakness. Now s/p PEG tube replacement.  PMH includes: Seizure, bilateral ischemic stroke in 2022 with subsequent dysphagia requiring PEG tube, aphasia, hemiplegia, and a chronic indwelling foley; and Urinary tract infection (01/28/2022), Esophagogastroduodenoscopy (01/28/2022); PEG placement (01/28/2022); IR GASTROSTOMY TUBE MOD SED (08/29/2022); and IR Replc Gastro/Colonic Tube Percut W/Fluoro (08/31/2024).    PT Comments  Pt making gradual progress.  She does need mod A for transfers.  Did ambulate 8' with heavy mod A of 1 , would benefit from +2 for further ambulation.  Pt tends to lean L and posteriorly.  Noting that family declined SNF and HHPT not available per TOC note so pursuing outpt PT.      If plan is discharge home, recommend the following: A lot of help with walking and/or transfers;A lot of help with bathing/dressing/bathroom;Assistance with cooking/housework;Help with stairs or ramp for entrance   Can travel by private vehicle     No  Equipment Recommendations  None recommended by PT    Recommendations for Other Services       Precautions / Restrictions Precautions Precautions: Fall Precaution/Restrictions Comments: PEG tube     Mobility  Bed Mobility Overal bed mobility: Needs Assistance Bed Mobility: Supine to Sit     Supine to sit: Mod assist     General bed mobility comments: Pt managing bil LE requiring assist for trunk and scooting    Transfers Overall transfer level: Needs assistance Equipment used: 1 person hand held assist Transfers: Sit to/from Stand, Bed to chair/wheelchair/BSC Sit to Stand: Mod assist Stand pivot transfers: Mod assist         General transfer comment: STS x 4 with therapist  supporting at gait belt and pt holding therapist arm or hand with L UE.  NT into assist with toileting ADLs during standing as pt had BM in bed.    Ambulation/Gait Ambulation/Gait assistance: Mod assist Gait Distance (Feet): 8 Feet (Needs chair follow for further) Assistive device: 1 person hand held assist Gait Pattern/deviations: Ataxic, Step-through pattern, Leaning posteriorly, Trunk flexed Gait velocity: decreased     General Gait Details: Leaning posteriorly and left into therapist.  Therpaist providing HHA on L side and mod A at gait belt (above PEG).  When backing to sit requiring assist to weight shift   Stairs             Wheelchair Mobility     Tilt Bed    Modified Rankin (Stroke Patients Only)       Balance Overall balance assessment: Needs assistance Sitting-balance support: Feet supported, Single extremity supported Sitting balance-Leahy Scale: Poor Sitting balance - Comments: Left UE support and leaning L requiring assist to recover at times   Standing balance support: Single extremity supported Standing balance-Leahy Scale: Poor Standing balance comment: L UE support on therapist and mod A                            Communication Communication Communication: Impaired Factors Affecting Communication: Difficulty expressing self  Cognition Arousal: Alert Behavior During Therapy: Flat affect   PT - Cognitive impairments: Difficult to assess Difficult to assess due to: Impaired communication  PT - Cognition Comments: Pt does nod yes/no but is delayed.  Follows verbal commands for exercises/transfers Following commands: Intact      Cueing Cueing Techniques: Verbal cues  Exercises General Exercises - Lower Extremity Long Arc Quad: AROM, Both, 10 reps, Seated Hip Flexion/Marching: AROM, Both, 10 reps, Seated    General Comments        Pertinent Vitals/Pain Pain Assessment Pain Assessment: No/denies  pain    Home Living                          Prior Function            PT Goals (current goals can now be found in the care plan section) Progress towards PT goals: Progressing toward goals    Frequency    Min 2X/week      PT Plan      Co-evaluation              AM-PAC PT 6 Clicks Mobility   Outcome Measure  Help needed turning from your back to your side while in a flat bed without using bedrails?: A Lot Help needed moving from lying on your back to sitting on the side of a flat bed without using bedrails?: A Lot Help needed moving to and from a bed to a chair (including a wheelchair)?: A Lot Help needed standing up from a chair using your arms (e.g., wheelchair or bedside chair)?: A Lot Help needed to walk in hospital room?: Total Help needed climbing 3-5 steps with a railing? : Total 6 Click Score: 10    End of Session Equipment Utilized During Treatment: Gait belt Activity Tolerance: Patient tolerated treatment well Patient left: with chair alarm set;in chair;with call bell/phone within reach (pillows to support) Nurse Communication: Mobility status PT Visit Diagnosis: Unsteadiness on feet (R26.81);Other abnormalities of gait and mobility (R26.89);Muscle weakness (generalized) (M62.81)     Time: 1225-1259 PT Time Calculation (min) (ACUTE ONLY): 34 min  Charges:    $Gait Training: 8-22 mins $Therapeutic Activity: 8-22 mins PT General Charges $$ ACUTE PT VISIT: 1 Visit                     Benjiman, PT Acute Rehab [facility] [phone]    Benjiman VEAR Mulberry 09/06/2024, 1:39 PM

## 2024-09-06 NOTE — Discharge Summary (Signed)
 Name: Cindy Brooks MRN: [id number] DOB: 04/11/1971 53 y.o. PCP: Delbert Clam, MD  Date of Admission: 08/30/2024  8:32 PM Date of Discharge: 09/06/2024 Attending Physician: Dr. Mliss Foot  Discharge Diagnosis: 1. Principal Problem:   PEG tube malfunction (HCC) Active Problems:   Acute bilateral thalamic, midbrain and bilateral cerebral ischemic CVA   HTN (hypertension)   CKD (chronic kidney disease)   Chronic indwelling Foley catheter   Malnutrition of moderate degree   Physical deconditioning   Severe protein-calorie malnutrition   Pressure injury of skin   Discharge Medications: Allergies as of 09/06/2024       Reactions   Codeine Hives, Rash   Penicillins Anaphylaxis, Swelling, Other (See Comments)   Strawberry Extract Anaphylaxis, Hives   Aspirin  Hives, Rash   Was given aspirin  in the hospital in November and did fine        Medication List     STOP taking these medications    amLODipine  10 MG tablet Commonly known as: NORVASC    chlorhexidine  0.12 % solution Commonly known as: PERIDEX    metoprolol  tartrate 50 MG tablet Commonly known as: LOPRESSOR    nitrofurantoin  (macrocrystal-monohydrate) 100 MG capsule Commonly known as: Macrobid    protein supplement shake Liqd Commonly known as: PREMIER PROTEIN   thiamine  100 MG tablet Commonly known as: VITAMIN B1       TAKE these medications    Aspirin  Low Dose 81 MG chewable tablet Generic drug: aspirin  Chew 1 tablet (81 mg total) by mouth daily. Start taking on: September 07, 2024 What changed: how to take this   atorvastatin  80 MG tablet Commonly known as: LIPITOR  Take 1 tablet (80 mg total) by mouth daily. What changed: how to take this   feeding supplement (OSMOLITE 1.5 CAL) Liqd Place 237 mLs into feeding tube 4 (four) times daily. 1 carton Osmolite 1.5 (237 mL) - QID - Administer slowly over 15-20 minutes via gravity - Free water  flush: 30 mL before and after each bolus feed + 180 mL  between feeds x 4 What changed:  when to take this additional instructions   folic acid  1 MG tablet Commonly known as: FOLVITE  Take 1 tablet (1 mg total) by mouth daily. What changed: how to take this   free water  Soln Place 180 mLs into feeding tube 4 (four) times daily. Between meals What changed:  how much to take when to take this additional instructions   free water  Soln Place 60 mLs into feeding tube 4 (four) times daily. With meals What changed: You were already taking a medication with the same name, and this prescription was added. Make sure you understand how and when to take each.   gabapentin  100 MG capsule Commonly known as: NEURONTIN  Take 1 capsule (100 mg total) by mouth 2 (two) times daily. What changed: additional instructions   Gerhardt's butt cream Crea Apply 1 application. topically daily as needed for irritation.   ipratropium-albuterol  0.5-2.5 (3) MG/3ML Soln Commonly known as: DUONEB Take 3 mLs by nebulization every 6 (six) hours as needed.   multivitamin with minerals Tabs tablet Take 1 tablet by mouth daily. What changed: how to take this   senna-docusate 8.6-50 MG tablet Commonly known as: Senokot-S Take 1 tablet by mouth 2 (two) times daily between meals as needed for mild constipation. What changed: how to take this               Durable Medical Equipment  (From admission, onward)  Start     Ordered   09/06/24 1012  For home use only DME Tube feeding  Once       Comments:  Continue tube feeds via PEG tube:  1 carton Osmolite 1.5 (237 mL) - QID between meal times  Administer slowly over 15-20 minutes via gravity  Increase Free water  flushes: 30 mL before and after each bolus feed + 180 mL between feeds x 4   Regimen at goal provides 1422 kcal, 60 gm protein, and 1682 mL free water  daily.   09/06/24 1012            Disposition and follow-up:   Cindy Brooks was discharged from [facility] in Fair condition.  At the hospital follow up visit please address:  Severe malnutrition--The patient's tube feeding regimen was changed while admitted. She was sent home with written instructions. Ensure she is administering them correctly PEG Tube--was dislodged on arrival. Replaced by IR. Ensure C/D/I and functioning properly Chronic Indwelling catheter--Ensure pt was able to set up appointment with [facility] fro catheter exchange as home health was not approved.  Hypotension--She was hypotensive ans symptomatic while admitted. Free water  flushes were increased and this resolved. Amlodipine  and metoprolol  were stopped on discharge. Monitor BP  2.  Labs / imaging needed at time of follow-up: BMP (electrolytes and AKI), phosphorus, and magnesium  for refeeding (stable while inpatient)   Follow-up Appointments:  Follow-up Information     [facility] Follow up.   Specialty: Rehabilitation Why: A referral has been placed. For fastest scheduling, call Monday Contact information: [address]  [address] [place]  [address] [phone]        Delbert Clam, MD Follow up in 1 week(s).   Specialty: Family Medicine Why: Hospital follow up Contact information: [address] [place] [address] [phone]                  Hospital Course by problem list: Cindy Brooks is a 53 y.o. person living with a history of prior bilateral ischemic stroke in 2022 with sequelae of dysphagia requiring PEG tube and weakness, HTN, HLD, chronic indwelling foley, and debility who presented after her PEG tube became dislodged, and admitted for PEG tube replacement now being discharged on hospital day 2 with the following pertinent hospital course:  PEG tube dislodgment Severe protein calorie malnutrition The patient was admitted on 11/17 after her family noted that her PEG tube had fallen  out.  IR was consulted and this was replaced on 11/18.  Of note on arrival the patient's BMI was noted to be 14.64 and the family reported that she is able to take some food p.o. and that they give her ensures through her PEG tube.  RD was consulted who recommended an adjustment to her tube feeding where she did receive two thirds of her daily nutritional requirements through the PEG tube and the rest of her oral intake.  Over the following days it was noted that the patient would eat some of her meals however her intake was minimal and was likely not enough to contribute greatly to her nutritional status.  In conjunction with RD her tube feeding was increased to provide 100% of her nutritional requirement with anything that she ate orally being extra.  She was placed on a bolus feeding regimen 4 times per day.  She did handle this change well without GI upset or distress.  She was monitored closely for refeeding syndrome and electrolytes were replaced as indicated. From this standpoint she was noted to be stable for discharge.  Hypotension The patient was hypotensive while admitted with pressures frequently 80s/60s.  It was thought that this was being contributed to by her hypovolemia.  Her home blood pressure medicines including amlodipine  and metoprolol  were held and her free water  flushes were increased.  Over the coming days she was continuing to be hypotensive so her free water  flushes were once again increased on 11/23.  After this final adjustment she did remain normotensive and she was deemed stable for discharge.  Upon discharge we did continue the increased free water  flushes and stopped both home antihypertensives.  History of CVA Upon admission it was noted that the patient was functionally weaker and had been declining over the last little bit.  Suspect poor caloric intake was contributing.  She was evaluated by PT OT who did recommend SNF however the patient's family was not fully on board with  sending her to SNF for fear of poor treatment.  She was approved for SNF's and her options were given to her however the family did elect to take her home with outpatient PT and OT.  She was denied for home health services due to a change in insurance.  Chronic Foley catheter Upon arrival the patient's catheter had not been changed in quite a while due to the family losing home health services with change of insurance.  It was changed upon arrival and the patient did not have a UTI.  Upon discharge she was [facility] up with [facility] and given the number to call to schedule an appointment for catheter exchange   Subjective The patient was seen and examined at the bedside this morning.  She was awake, looking at the team and was answering questions by head nodding and head shaking.  We did discuss going home which she was agreeable to.  She denied pain or distress at the time.  Discharge Exam:   BP 105/77   Pulse (!) 59   Temp (!) 97.5 F (36.4 C) (Oral)   Resp 19   Ht 5' 4 (1.626 m)   Wt 38.7 kg   LMP  (LMP Unknown)   SpO2 100%   BMI 14.64 kg/m  Discharge exam:  Const: Awake, alert in NAD, cachectic HENT: Normocephalic, atraumatic, mucus membranes moist Card: RRR, No MRG, No pitting edema on LE's bilaterally  Resp: LCTAB, no increased work of breathing Abd: Soft, NTND, Bsx4, PEG tube with dressing C/D/I Extremities: Warm, pink Neuro: Notable spasticity in right upper extremity with flexor contractures of fingers.   Pertinent Labs, Studies, and Procedures:     Latest Ref Rng & Units 09/03/2024    5:03 AM 09/02/2024    4:13 AM 09/01/2024   10:27 AM  CBC  WBC 4.0 - 10.5 K/uL 4.6  4.4  5.3   Hemoglobin 12.0 - 15.0 g/dL 9.7  9.4  88.7   Hematocrit 36.0 - 46.0 % 29.3  28.0  33.6   Platelets 150 - 400 K/uL 178  179  200        Latest Ref Rng & Units 09/06/2024    4:19 AM 09/04/2024    3:12 AM 09/03/2024    5:03 AM  CMP  Glucose 70 - 99 mg/dL 59  68  89   BUN 6 - 20 mg/dL  19  16  16    Creatinine 0.44 - 1.00 mg/dL 8.96  0.88  0.88   Sodium 135 - 145 mmol/L 138  139  137   Potassium 3.5 - 5.1 mmol/L 4.4  4.6  4.2   Chloride 98 - 111 mmol/L 102  104  102   CO2 22 - 32 mmol/L 28  27  26    Calcium  8.9 - 10.3 mg/dL 9.0  8.9  8.8     IR Replc Gastro/Colonic Tube Percut W/Fluoro Result Date: 08/31/2024 INDICATION: Enteric feeding tube accidentally removed EXAM: FLUOROSCOPIC REPLACEMENT OF THE BALLOON RETENTION GASTROSTOMY MEDICATIONS: NONE. ANESTHESIA/SEDATION: None. CONTRAST:  20 cc-administered into the gastric lumen. FLUOROSCOPY: Radiation Exposure Index (as provided by the fluoroscopic device): 8.0 mGy Kerma COMPLICATIONS: None immediate. PROCEDURE: Informed written consent was obtained from the patient's family after a thorough discussion of the procedural risks, benefits and alternatives. All questions were addressed. Maximal Sterile Barrier Technique was utilized including caps, mask, sterile gowns, sterile gloves, sterile drape, hand hygiene and skin antiseptic. A timeout was performed prior to the initiation of the procedure. Previous imaging reviewed. Percutaneous tract was cannulated with a Kumpe catheter. Contrast injection confirms position in the stomach. Over guidewire, a new 12 French balloon retention gastrostomy was advanced. Retention balloon inflated with the appropriate volume containing contrast for visualization. This was retracted against the stomach wall. Contrast injection confirms position. Access ready for use. Catheter secured externally. No immediate complication. Patient tolerated the procedure well. IMPRESSION: Successful fluoroscopic replacement of the 12 French balloon retention gastrostomy Electronically Signed   By: CHRISTELLA.  Shick M.D.   On: 08/31/2024 13:15   Portable chest 1 View Result Date: 08/31/2024 EXAM: 1 VIEW(S) XRAY OF THE CHEST 08/31/2024 03:46:15 AM COMPARISON: 08/25/2022 CLINICAL HISTORY: Cough FINDINGS: LUNGS AND PLEURA: Question  left retrocardiac airspace opacity versus patient rotation obscuring the left costophrenic angle. Small left pleural effusion. No pneumothorax. HEART AND MEDIASTINUM: No acute abnormality of the cardiac and mediastinal silhouettes. BONES AND SOFT TISSUES: No acute osseous abnormality. IMPRESSION: 1. Small left pleural effusion. 2. Questionable left retrocardiac airspace opacity versus obscuration from patient rotation. Consider further evaluation with dedicated PA and lateral chest radiograph. Electronically signed by: Waddell Calk MD 08/31/2024 05:14 AM EST RP Workstation: [id number]     Discharge Instructions: Discharge Instructions     Call MD for:  difficulty breathing, headache or visual disturbances   Complete by: As directed    Call MD for:  persistant dizziness or light-headedness   Complete by: As directed    Call MD for:  persistant nausea and vomiting   Complete by: As directed    Call MD for:  temperature >100.4   Complete by: As directed    Diet - low sodium heart healthy   Complete by: As directed    Discharge instructions   Complete by: As directed    Thank you for allowing us  to be part of your care. You were hospitalized for PEG tube replacement and malnutrition. We treated you with PEG tube replacement and tube feeding.  See the changes in your medications and management of your chronic conditions below:  *For your tube feeding -We have Changed your regimen to the following:  -Place 237 mLs into feeding tube 4 (four) times daily. 1 carton Osmolite 1.5 (237 mL)  - Administer slowly over 15-20 minutes via gravity  - Free water  flush: 30 mL before and after each bolus feeds - Additional free water  flush: 180 mL between feeds four times per day.   *For your Blood pressure -Your BP was low while in  the hospital -We have STOPPED the following medications:  -Amlodipine   -Metoprolol   -Please see your PCP in 7 to 10 days  FOLLOW UP APPOINTMENTS: Please call your family  doctor at [phone] to arrange a follow up appointment   You should call [facility] at [phone] to schedule an appointment for her catheter exchange.  Please make sure to change her tube feeding to her new regimen!  Please call your PCP or our clinic if you have any questions or concerns, we may be able to help and keep you from a long and expensive emergency room wait. Our clinic and after hours phone number is [phone]. The best time to call is Monday through Friday 9 am to 4 pm but there is always someone available 24/7 if you have an emergency. If you need medication refills please notify your pharmacy one week in advance and they will send us  a request.   We are glad you are feeling better,  Schuyler Novak Internal Medicine Inpatient Teaching Service at [facility]   Increase activity slowly   Complete by: As directed    No wound care   Complete by: As directed        Signed: Novak Schuyler, DO 09/06/2024, 6:09 PM

## 2024-09-06 NOTE — TOC Transition Note (Signed)
 Transition of Care [facility]) - Discharge Note   Patient Details  Name: Cindy Brooks MRN: [id number] Date of Birth: 11-16-1970  [facility] CM/SW Contact:  Roxie KANDICE Stain, RN Phone Number: 09/06/2024, 1:48 PM   Clinical Narrative:  Patient stable for discharge.  Spoke to daughter, Keyona, regarding discharge.  Keyona is agreeable to OP PT referral.  Pam with [employer] aware of tube feed needs.  [name] states he is ok to call for transportation, spouse will be home. Address, Phone number and PCP verified.     Final next level of care: Home/Self Care Barriers to Discharge: Barriers Resolved   Patient Goals and CMS Choice Patient states their goals for this hospitalization and ongoing recovery are:: daughter, Keyona, wants patient to return home CMS Medicare.gov Compare Post Acute Care list provided to:: Patient Represenative (must comment) Choice offered to / list presented to : Adult Children      Discharge Placement             Home          Discharge Plan and Services Additional resources added to the After Visit Summary for   In-house Referral: Clinical Social Work   Post Acute Care Choice: Skilled Nursing Facility                               Social Drivers of Health (SDOH) Interventions SDOH Screenings   Food Insecurity: No Food Insecurity (08/31/2024)  Housing: Low Risk  (08/31/2024)  Transportation Needs: No Transportation Needs (08/31/2024)  Utilities: Not At Risk (08/31/2024)  Depression (PHQ2-9): High Risk (05/14/2022)  Tobacco Use: Low Risk  (08/26/2024)     Readmission Risk Interventions    09/06/2024    1:47 PM 09/02/2022    2:23 PM 05/28/2022    2:27 PM  Readmission Risk Prevention Plan  Post Dischage Appt Complete    Medication Screening Complete    Transportation Screening Complete Complete Complete  PCP or Specialist Appt within 3-5 Days   Complete  HRI or Home Care Consult   Complete  Social Work Consult for  Recovery Care Planning/Counseling   Complete  Palliative Care Screening   Not Applicable  Medication Review [facility])  Complete Complete  PCP or Specialist appointment within 3-5 days of discharge  Complete   HRI or Home Care Consult  Complete   SW Recovery Care/Counseling Consult  Complete   Palliative Care Screening  Complete   Skilled Nursing Facility  Not Applicable

## 2024-09-06 NOTE — Discharge Instructions (Addendum)
 Thank you for allowing us  to be part of your care. You were hospitalized for PEG tube replacement and malnutrition. We treated you with PEG tube replacement and tube feeding.  See the changes in your medications and management of your chronic conditions below:  *For your tube feeding -We have Changed your regimen to the following:  -Place 237 mLs into feeding tube 4 (four) times daily. 1 carton Osmolite 1.5 (237 mL)  - Administer slowly over 15-20 minutes via gravity  - Free water  flush: 30 mL before and after each bolus feeds - Additional free water  flush: 180 mL between feeds four times per day.   *For your Blood pressure -Your BP was low while in the hospital -We have STOPPED the following medications:  -Amlodipine   -Metoprolol   -Please see your PCP in 7 to 10 days  FOLLOW UP APPOINTMENTS: Please call your family doctor at [phone] to arrange a follow up appointment   You should call [facility] at [phone] to schedule an appointment for her catheter exchange.  Please make sure to change her tube feeding to her new regimen!  Please call your PCP or our clinic if you have any questions or concerns, we may be able to help and keep you from a long and expensive emergency room wait. Our clinic and after hours phone number is [phone]. The best time to call is Monday through Friday 9 am to 4 pm but there is always someone available 24/7 if you have an emergency. If you need medication refills please notify your pharmacy one week in advance and they will send us  a request.   We are glad you are feeling better,  Schuyler Novak Internal Medicine Inpatient Teaching Service at [facility]

## 2024-09-07 ENCOUNTER — Telehealth: Payer: Self-pay

## 2024-09-07 NOTE — Telephone Encounter (Signed)
 The patient was discharged from the hospital and they were not able to secure a home health agency for the patient

## 2024-09-07 NOTE — Transitions of Care (Post Inpatient/ED Visit) (Signed)
   09/07/2024  Name: Cindy Brooks MRN: [id number] DOB: [birth year]  Today's TOC FU Call Status: Today's TOC FU Call Status:: Unsuccessful Call (1st Attempt) Unsuccessful Call (1st Attempt) Date: 09/07/24  Attempted to reach the patient regarding the most recent Inpatient/ED visit.  Follow Up Plan: Additional outreach attempts will be made to reach the patient to complete the Transitions of Care (Post Inpatient/ED visit) call.   Signature  Slater Diesel, RN

## 2024-09-08 ENCOUNTER — Telehealth: Payer: Self-pay

## 2024-09-08 NOTE — Telephone Encounter (Signed)
 I spoke to Cindy Brooks and informed her that the visit on 09/27/2024 with Dr Delbert can be virtual.  She was very adult nurse.  She also stated that she was able to get the appropriate syringes for the tube feedings.

## 2024-09-08 NOTE — Transitions of Care (Post Inpatient/ED Visit) (Signed)
 09/08/2024  Name: Cindy Brooks MRN: [id number] DOB: 10-Aug-1971  Today's TOC FU Call Status: Today's TOC FU Call Status:: Successful TOC FU Call Completed Unsuccessful Call (1st Attempt) Date: 09/07/24 [facility] FU Call Complete Date: 09/08/24  Patient's Name and Date of Birth confirmed. Name, DOB  Transition Care Management Follow-up Telephone Call Date of Discharge: 09/06/24 Discharge Facility: [facility]) Type of Discharge: Inpatient Admission Primary Inpatient Discharge Diagnosis:: PEG tube malfunction How have you been since you were released from the hospital?: Same (call completed with patient's daughter, Keyona.  She was very upset that her mother was sent home with a new PEG tube and the syringes that she was given to not work with this tube.) Any questions or concerns?: Yes Patient Questions/Concerns:: Keyona said she has not been able to give her mother the tube feedings as ordered because a new PEG was placed and  the syringes that she was given do not work with this PEG.  She said she called the hospital floor where her mother was discharged from and was told they could not help her. She said she went to the ED and they also told her that they are not able to help her.  She has not called [employer] yet but plans to do so.  [facility] provides the tube feedings.  She was also upset that her mother cannot get home health care and is interested in having [employer] again for her mother.  She believes that [employer] provided much better coverage for her mother that Medicaid provides. I expalined the benefts of Medicaid coverage- PCS, CAP and told her that they also cover home health services but the difficulty is finding an agency that accepts the [facility] and has the appropriate staff available.  I encouraged her to call the Marketplace to  discuss her questions about [employer] and if it would be wise to re-enroll her mother with [employer] now that she has Medicaid. . She said she has the  phone number for the Marketplace and will call. Patient Questions/Concerns Addressed: Other:, Notified Provider of Patient Questions/Concerns (Keyona to call [employer] as well as the Marketplace.  I will also call [employer].)  Items Reviewed: Did you receive and understand the discharge instructions provided?: Yes Medications obtained,verified, and reconciled?: No Medications Not Reviewed Reasons:: Other: (Keyona said she has all of her mother's medications and is prestty familiar with them.  She said she did not have any questions about the med regime and did not need to review the med list.) Any new allergies since your discharge?: No Dietary orders reviewed?: Yes Type of Diet Ordered:: Osmolite 1.5 - 237 mLs into feeding tube 4 x/daily - administered via gravity.  She also has orders for the administration of free water . Keyona is not able to administer the feedings due to not having a syringe that is compatible with the PEG.  She said her mother is able to take some medications by mouth Do you have support at home?: Yes People in Home [RPT]: child(ren), adult Name of Support/Comfort Primary Source: her daughters.  Keyona, is the prirmary caregiver.  Medications Reviewed Today: Medications Reviewed Today   Medications were not reviewed in this encounter     Home Care and Equipment/Supplies: Were Home Health Services Ordered?: No (She was referred to outpatient therapies and has an OT appointment scheduled for 09/24/2025.) Any new equipment or medical supplies ordered?: Yes Name of Medical supply agency?: [facility]- [employer] Were you able to get the equipment/medical supplies?: No (She  recevied the supplements but not the correct syringes to adminsiter the feedings) Do you have any questions related to the use of the equipment/supplies?: Yes What questions do you have?: please refer to prior notes in this entry regarding the syringes.  Functional Questionnaire: Do you need assistance with  bathing/showering or dressing?: Yes (Keyona provides needed assistance. She also receieves PCS   services and Keyona said she needs to call the agency to have the services re-instated. We also discussed CAP services and Keyona said she has the number to call to initiate that referral.) Do you need assistance with meal preparation?: Yes Do you need assistance with eating?: Yes (Keyona assists as needed) Do you have difficulty maintaining continence: Yes (She has a foley catheter) Do you need assistance with getting out of bed/getting out of a chair/moving?: Yes Do you have difficulty managing or taking your medications?: Yes (Keyona manages the medications.  She said her mother is only able to take some of them by mouth. The others need to be administered via PEG but she has not been able to administer those.)  Follow up appointments reviewed: PCP Follow-up appointment confirmed?: Yes Date of PCP follow-up appointment?: 09/27/24 Follow-up Provider: [facility] Follow-up appointment confirmed?: Yes Date of Specialist follow-up appointment?: 09/15/24 Follow-Up Specialty Provider:: [facility]. Do you need transportation to your follow-up appointment?: No (Keyona said that they try to arrange appointments around her schedule and her sister's schedule) Do you understand care options if your condition(s) worsen?: Yes-patient verbalized understanding  I was able to contact Kristie Holbrook, RN CM at the hospital about the issues with the syringes and I was able to speak with Holley Herring, RN/ [facility] about the same issue.   Pam called Keyona and then called me back to let me know that she was able to determine what syringes they need and she and Roxie are working on getting the syringes from the floor she was discharged from. Pam said that her daughter can then come to pick them up.  Pam plans to call Keyona back when they have the syringes ready for pick up.      SIGNATURE  Slater Diesel, RN

## 2024-09-08 NOTE — Telephone Encounter (Signed)
 Yes, her visit with me can be virtual.  Thank you.

## 2024-09-08 NOTE — Telephone Encounter (Signed)
 Dr Newlin-   FYI: I spoke to patient's daughter, Keyona, who  said she has not been able to give her mother the tube feedings and her medications as ordered because a new PEG was placed and the syringes that she was given do not work with this PEG. She said she called the hospital floor where her mother was discharged from and was told they could not help her. She said she went to the ED and they also told her that they are not able to help her. She has not called [employer], the enteral feeding supplier, but plans to do so.   I was able to contact Roxie Stain, RN CM at the hospital about the issues with the syringes and I was also able to speak with Holley Herring, RN/ [employer].   Pam called Keyona and was able to determine what syringes are needed  and she and Roxie are working on getting the syringes from the floor she was discharged from for her daughter  to pick up.    She has an appointment with Dr Newlin - 12/15 and Keyona would like to know if that can be virtual.  She is seeing [facility] 12/3 and they will need to do the foley changes because the hospital was not able to secure home health services

## 2024-09-23 ENCOUNTER — Ambulatory Visit

## 2024-09-24 ENCOUNTER — Ambulatory Visit: Admitting: Occupational Therapy

## 2024-09-27 ENCOUNTER — Encounter: Payer: Self-pay | Admitting: Family Medicine

## 2024-09-27 ENCOUNTER — Ambulatory Visit (HOSPITAL_BASED_OUTPATIENT_CLINIC_OR_DEPARTMENT_OTHER): Admitting: Family Medicine

## 2024-09-27 DIAGNOSIS — I69359 Hemiplegia and hemiparesis following cerebral infarction affecting unspecified side: Secondary | ICD-10-CM | POA: Diagnosis not present

## 2024-09-27 DIAGNOSIS — Z931 Gastrostomy status: Secondary | ICD-10-CM

## 2024-09-27 DIAGNOSIS — Z978 Presence of other specified devices: Secondary | ICD-10-CM

## 2024-09-27 NOTE — Progress Notes (Signed)
 Virtual Visit via Video Note  I connected with Cindy Brooks, on 09/27/2024 at 2:25 PM by video enabled telemedicine device and verified that I am speaking with the correct person using two identifiers.   Consent: I discussed the limitations, risks, security and privacy concerns of performing an evaluation and management service by telemedicine and the availability of in person appointments. I also discussed with the patient that there may be a patient responsible charge related to this service. The patient expressed understanding and agreed to proceed.   Location of Patient: Home  Location of Provider: Clinic   Persons participating in Telemedicine visit: Cindy Brooks Dr. Tyrion Glaude Steven - Son-in-law   Discussed the use of AI scribe software for clinical note transcription with the patient, who gave verbal consent to proceed.  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  History is provided by her son-in-law, Elspeth.  She was recently discharged after a PEG tube change secondary to PEG tube malfunction. Since discharge, the PEG tube has been functioning well with feeds, prior discoloration has resolved, and there are no fevers or drainage at the site.  During her hospitalization her antihypertensives were stopped due to low blood pressure.  Her Foley catheter was changed last week in the urologist's office. She does not have home health services due to insurance not covering this, so the catheter will be changed monthly at urology. She was discharged about three weeks ago with satisfactory labs and has all needed medication refills.      Past Medical History:  Diagnosis Date   Hypertension    Hypertensive  urgency 01/20/2022   Seizure (HCC) 01/28/2022   Stroke (HCC)    TIA (transient ischemic attack) 2019   Urinary tract infection 01/28/2022   Allergies[1]  Medications Ordered Prior to Encounter[2]  ROS: Unable to obtain due to patient being nonverbal  Observations/Objective: Awake, at baseline Not in acute distress Normal mood Patient is able to wave      Latest Ref Rng & Units 09/06/2024    4:19 AM 09/04/2024    3:12 AM 09/03/2024    5:03 AM  CMP  Glucose 70 - 99 mg/dL 59  68  89   BUN 6 - 20 mg/dL 19  16  16    Creatinine 0.44 - 1.00 mg/dL 8.96  9.11  9.11   Sodium 135 - 145 mmol/L 138  139  137   Potassium 3.5 - 5.1 mmol/L 4.4  4.6  4.2   Chloride 98 - 111 mmol/L 102  104  102   CO2 22 - 32 mmol/L 28  27  26    Calcium  8.9 - 10.3 mg/dL 9.0  8.9  8.8     Lipid Panel     Component Value Date/Time   CHOL 109 08/21/2022 0543   CHOL 200 (H) 07/05/2022 1046   TRIG 94 08/21/2022 0543   HDL 22 (L) 08/21/2022 0543   HDL 35 (L) 07/05/2022 1046   CHOLHDL 5.0 08/21/2022 0543   VLDL 19 08/21/2022 0543   LDLCALC 68 08/21/2022 0543   LDLCALC 131 (H) 07/05/2022 1046   LABVLDL 34 07/05/2022 1046    Lab Results  Component Value Date   HGBA1C 4.6 (L) 08/21/2022     Assessment and  plan:   Assessment & Plan Hemiparesis due to old stroke Risk factor modification - Continue statin - Antihypertensives were discontinued during hospitalization due to low blood pressures  Indwelling urinary catheter management Monthly Foley catheter changes required to prevent UTIs. Insurance does not cover home health services. - Schedule monthly urologist appointments for catheter changes.  PEG tube in place -Secondary to dysphagia poststroke with associated protein calorie malnutrition - No evidence of infection around the PEG tube - Continue PEG tube feedings   No orders of the defined types were placed in this encounter.   Follow Up Instructions: Keep previously scheduled  appointment   I discussed the assessment and treatment plan with the patient. The patient was provided an opportunity to ask questions and all were answered. The patient agreed with the plan and demonstrated an understanding of the instructions.   The patient was advised to call back or seek an in-person evaluation if the symptoms worsen or if the condition fails to improve as anticipated.     I provided 16 minutes total of Telehealth time during this encounter including median intraservice time, reviewing previous notes, investigations, ordering medications, medical decision making, coordinating care and patient verbalized understanding at the end of the visit.     Corrina Sabin, MD, FAAFP. [facility] [place], [place] [phone]   09/27/2024, 2:25 PM     [1]  Allergies Allergen Reactions   Codeine Hives and Rash   Penicillins Anaphylaxis, Swelling and Other (See Comments)   Strawberry Extract Anaphylaxis and Hives   Aspirin  Hives and Rash    Was given aspirin  in the hospital in November and did fine  [2]  Current Outpatient Medications on File Prior to Visit  Medication Sig Dispense Refill   aspirin  81 MG chewable tablet Chew 1 tablet (81 mg total) by mouth daily. 30 tablet 0   atorvastatin  (LIPITOR ) 80 MG tablet Take 1 tablet (80 mg total) by mouth daily. 90 tablet 0   folic acid  (FOLVITE ) 1 MG tablet Take 1 tablet (1 mg total) by mouth daily. (Patient taking differently: Place 1 mg into feeding tube daily.) 90 tablet 1   gabapentin  (NEURONTIN ) 100 MG capsule Take 1 capsule (100 mg total) by mouth 2 (two) times daily. (Patient taking differently: Take 100 mg by mouth 2 (two) times daily. Per tube) 180 capsule 1   ipratropium-albuterol  (DUONEB) 0.5-2.5 (3) MG/3ML SOLN Take 3 mLs by nebulization every 6 (six) hours as needed. 360 mL 1   Multiple Vitamin (MULTIVITAMIN WITH MINERALS) TABS tablet Take 1 tablet by mouth daily. (Patient taking  differently: Place 1 tablet into feeding tube daily.)     Nutritional Supplements (FEEDING SUPPLEMENT, OSMOLITE 1.5 CAL,) LIQD Place 237 mLs into feeding tube 4 (four) times daily. 1 carton Osmolite 1.5 (237 mL) - QID - Administer slowly over 15-20 minutes via gravity - Free water  flush: 30 mL before and after each bolus feed + 180 mL between feeds x 4 28440 mL 0   Nystatin  (GERHARDT'S BUTT CREAM) CREA Apply 1 application. topically daily as needed for irritation.     senna-docusate (SENOKOT-S) 8.6-50 MG tablet Take 1 tablet by mouth 2 (two) times daily between meals as needed for mild constipation. (Patient taking differently: Place 1 tablet into feeding tube 2 (two) times daily between meals as needed for mild constipation.) 180 tablet 0   Water  For Irrigation, Sterile (FREE WATER ) SOLN Place 180 mLs into feeding tube 4 (four) times daily. Between meals 78399  mL 0   Water  For Irrigation, Sterile (FREE WATER ) SOLN Place 60 mLs into feeding tube 4 (four) times daily. With meals 7200 mL 0   No current facility-administered medications on file prior to visit.

## 2024-09-27 NOTE — Patient Instructions (Signed)
 VISIT SUMMARY:  You had a follow-up appointment to discuss the management of your PEG tube and Foley catheter. Your PEG tube is functioning well, and there are no signs of infection. Your Foley catheter was recently changed, and you will need to continue with monthly changes at the urologist's office.  YOUR PLAN:  -INDWELLING URINARY CATHETER MANAGEMENT: An indwelling urinary catheter is a tube placed in your bladder to drain urine. To prevent infections, your catheter needs to be changed monthly. Since your insurance does not cover home health services, you will need to schedule these changes with your urologist.  INSTRUCTIONS:  Please schedule monthly appointments with your urologist for your Foley catheter changes.

## 2024-10-01 ENCOUNTER — Ambulatory Visit: Admitting: Occupational Therapy

## 2024-10-12 NOTE — Progress Notes (Signed)
 Case Management Discharge Note  Pt to dc home with her daughter GLENWOOD Hudson and her son in law. Pt Hudson pt has 24hr care at home, when she works her son in law assists or her younger sister. Arranged OPPT/OT/ST at [facility] in [facility] per [place] request. She is aware of [facility] transport if needed. Pt has tube feeds at home, active with [facility]. Social worker met with pt - she is slow to respond be shook her head yes to wanting to return home with her daughter.  Pt discharged home by ambulance. Spoke with pt's other daughter Logan and advised of above information. She is agreeable with OPPT/OT/ST and will help as possible.  Contacted [facility] for transport.         CSN: [id number] DOB: April 15, 1971 Service: General Medicine Location: 607/01  Patient Class: Inpatient  DC Disposition: : Home or Self Care  Discharge DC Disposition: : Home or Self Care Referrals: Outpatient Therapy Discharge Transport: Ambulance Discharge Transport Agency [place]: [facility]  [phone])  Discharge Referrals Patient Preference: [place] geographical local area/county shared with patient/family: Return/previous involvement Patient Preference for Post-Acute Provider Form completed: Return/Previous Involvement Case closed, patient/family agree with disposition plan: Yes           Sherrilyn CHRISTELLA Maser, MSW

## 2024-10-12 NOTE — Discharge Summary (Signed)
 Hospitalist  Discharge Summary   Name: Cindy Brooks Age: 53 yrs  MRN: [id number] DOB: [birth year]  Admit Date: 10/08/2024 Admitting Physician: Glo Stanly Oris, MD  Discharge Date: 10/12/2024 Discharge Physician: Roddie Curtistine Hamilton, MD   Admission Diagnosis:   Lactic acidosis [E87.20] Hypernatremia [E87.0] Hyponatremia [E87.1] Hyperglycemia [R73.9] Sarcopenia [M62.84] NSTEMI (non-ST elevated myocardial infarction)    (CMD) [I21.4] Sepsis with acute renal failure without septic shock, due to unspecified organism, unspecified acute renal failure type (CMD) [A41.9, R65.20, N17.9]   Discharge Diagnoses:   Principal Problem (Resolved):   Hypernatremia Active Problems:   History of CVA with residual deficit   Chronic indwelling Foley catheter   Severe protein-calorie malnutrition (CMD)   Primary hypertension   Impaired functional mobility, balance, and endurance   Anemia Resolved Problems:   Lactic acidosis   Sepsis    (CMD)   Acute kidney injury   Elevated troponin   Hyperglycemia   TO DO List at Follow-up for PCP/Specialist:   Key Medication changes:  Start augmentin 500-125 mg BID x5 days via PEG tube Follow up with PCP in 1-2 weeks F/u with PT/OT outpatient F/u with SLP outpatient Soft and bite sized with thin liquids Sit totally upright and be fed slowly Toothbrush and toothpaste TID   Hospital Course:   For full details, please see H&P, progress notes, consult notes and ancillary notes. Briefly, Cindy Brooks is a 53 y.o. year old female with a PMH of bilateral ischemic stroke (2022) with residual dysphagia requiring PEG tube, paraplegia, chronic indwelling Foley catheter, hypertension, hyperlipidemia, malnutrition, and physical deconditioning, who was admitted on 10/08/2024 with tachypnea and altered mental status. The patient's hospital course will be summarized below.    On presentation, she was found to have severe hypernatremia (initial sodium 162-164),  acute kidney injury (creatinine 2.3-2.57), lactic acidosis, leukocytosis, and fever, with concern for sepsis. Imaging revealed right lower lobe aspiration and/or aspiration pneumonia and probable left lower lobe pneumonia, as well as multiple bladder calcifications and calcified stones in the distal right ureter without hydronephrosis. Chest X-ray showed retrocardiac opacity and right suprahilar ground glass opacities, consistent with aspiration or early pneumonia. CT head was negative for acute intracranial abnormality.  Hypernatremia was managed with D5W and free water  flushes via PEG tube, with sodium monitored closely and gradual improvement to 144 by 10/11/2024. Nutrition was consulted and tube feeds were adjusted to Osmolite 1.5 with scheduled free water  flushes and additional 200 mL q4h for ongoing management. Sepsis was initially treated with vancomycin  and cefepime , later transitioned to vancomycin  and ceftriaxone , and then to piperacillin-tazobactam based on CT findings and clinical suspicion for aspiration pneumonia; vancomycin  was discontinued after blood cultures grew Staphylococcus epidermidis, suspected to be a contaminant, and MRSA nasal swab was negative. Lactic acidosis and acute kidney injury improved with IV fluids and supportive care. Foley catheter was exchanged in the ED and urine culture showed mixed flora, likely contamination or colonization.  Speech-language pathology evaluated her for ongoing oropharyngeal dysphagia. Bedside and modified barium swallow studies confirmed oral and pharyngeal phase deficits with trace silent aspiration on thin liquids, but no aspiration on puree or soft/bite-sized solids; recommendations included a soft and bite-sized diet with thin liquids, 1:1 feeding assistance, and continued PEG tube use for medications and nutrition as needed. Diligent oral care was emphasized to reduce aspiration risk.  Physical therapy assessment revealed significant decline in  functional mobility from prior baseline, with current status requiring maximal to total assistance for bed mobility, transfers, and  ambulation, and recommendations for post-acute rehabilitation or home health with appropriate equipment and support. Family elected for home with outpatient PT/OT/SLP.   Pt unfortunately missed doses of antibiotics on 12/28 and 12/29 so will send home with a full 5 day course of augmentin to ensure adequate treatment of CAP.    The patient's chronic medical conditions were treated accordingly per the patient's home medication regimen except as noted in the plan above and in the medication list below.    Discharge Condition:   Disposition: Patient discharged to Home or Self Care in fair condition.  Diet at discharge: Adult Diet- Dysphagia; Thin liquids (no thickener added); Soft and bite sized foods (Level 6)  Activity at Discharge: Ambulate ad lib    Physical Exam at Discharge   BP 103/73 (BP Location: Left arm, Patient Position: Lying)   Pulse 65 Comment: Simultaneous filing. User may not have seen previous data.  Temp 97.6 F (36.4 C) (Axillary)   Resp 18   Ht 1.575 m (5' 2)   Wt 37.4 kg (82 lb 7.2 oz)   SpO2 95%   BMI 15.08 kg/m  CONSTITUTIONAL: alert, NAD CARDIOVASCULAR: normal rate, regular rhythm; no murmurs appreciated, 2+ radial pulses, warm extremities RESPIRATORY: comfortable on room air, normal respiratory effort, no crackles appreciated ABDOMEN: soft, non-tender, non-distended   Discharge Medications:      Medication List     START taking these medications    amoxicillin-pot clavulanate 500-125 mg per tablet Commonly known as: AUGMENTIN 1 tablet by G-tube route 2 (two) times a day for 5 days.       CONTINUE taking these medications    aspirin  81 mg EC tablet Take 81 mg by mouth daily.   atorvastatin  80 mg tablet Commonly known as: LIPITOR  Take 80 mg by mouth daily.   folic acid  1 mg tablet Commonly known as:  FOLVITE  Take 1 mg by mouth daily.   guaiFENesin  100 mg/5 mL syrup Commonly known as: ROBITUSSIN Take 200 mg by mouth 3 (three) times a day as needed for cough.   ipratropium-albuteroL  0.5-2.5 mg/3 mL nebulizer solution Commonly known as: DUO-NEB Take 3 mL by nebulization every 6 (six) hours as needed for wheezing.   MULTIVITAMIN ORAL Take 1 tablet by mouth daily.         Where to Get Your Medications     These medications were sent to [id number] - [place], [place] - [address] - PHONE: [phone] - FAX: [phone]  [address], [place] [address]    Phone: [phone]  amoxicillin-pot clavulanate 500-125 mg per tablet     Significant Diagnostic Tests:   LABS:  Lab Results  Component Value Date   WBC 5.82 10/12/2024   HGB 9.5 (L) 10/12/2024   HCT 28.4 (L) 10/12/2024   PLT 107 (L) 10/12/2024   ALT 33 10/08/2024   AST 35 10/08/2024   NA 141 10/12/2024   K 4.0 10/12/2024   CL 113 (H) 10/12/2024   CREATININE 1.23 (H) 10/12/2024   BUN 15 10/12/2024   CO2 25 10/12/2024   HGBA1C 5.3 10/08/2024   IMAGING:  Transthoracic echo (TTE) limited  Final Result by Gerhardt Mana, MD (12/29 1323)  Version  1                                                                                                          Study ID  [id number]                                                                     +--------------------------------------------------+                             +----------+                                                                                                                                                    [facility]                                                                                                                                 [facility]                                          +--------------------------------------------------+                                                                                                                                                   +----------+  [facility]  [address]  Transthoracic Echocardiogram Report  Name  ERION, HERMANS                             Study Date  10-11-2024,   9  12 AM                   Height  62.01 in  MRN  [id number]                                      Patient Location    [facility]                       Weight  82.453 lb  DOB  09-23-1971  [id number]                        Birth Gender  Female                                 BSA  1.31 m  Age  72 Years                                       Ethnicity  3                                        BP  111 - 76 mmHg                                                                                                          HR  49 bpm  Reason For Study  increased troponin-I level  Ordering Physician  CLIFTON LEVERS MAHESH  Performed By  ZELL DENNIS  Referring Physician  CLIFTON LEVERS [facility]  PROCEDURE  Image Quality  Technically adequate. Patient refused to let me continue   half- way through the exam.  The left ventricular size is normal.  Mild left ventricular hypertrophy  LV ejection fraction = 60-65%.  The right ventricle is normal size.  The right ventricular systolic function is normal.  The inferior vena cava was not visualized during the exam.  There is small size pericardial effusion.  There are no echocardiographic indications of cardiac tamponade.  Systolic RV collapse.  No apparent valvular abnormalities.  No previous images to compare. From OSH report 05-27-2022, presence of   pericardial effusion.  LEFT VENTRICLE  The left ventricular size is normal. Mild left ventricular hypertrophy. LV   ejection fraction =  60-65%.  RIGHT VENTRICLE  The right ventricle is normal size. The right  ventricular systolic   function is normal.  LEFT  ATRIUM  The left atrial size is normal.  RIGHT ATRIUM  Right atrial size is normal.  AORTIC VALVE  Structurally normal aortic valve. There is no aortic stenosis. There is no   aortic regurgitation.  MITRAL VALVE  The mitral valve leaflets appear normal. There is no mitral regurgitation   noted.  TRICUSPID VALVE  Structurally normal tricuspid valve. No tricuspid regurgitation.  PULMONIC VALVE  Structurally normal pulmonic valve. There is no pulmonic valvular   regurgitation. There is no  pulmonic valvular stenosis.  ARTERIES  The aortic sinus is normal size. The ascending aorta is normal size.  VENOUS  The inferior vena cava was not visualized during the exam.  EFFUSION  There is small size pericardial effusion. There are no echocardiographic   indications of cardiac  tamponade. Systolic RV collapse.  MMode-2D Measurements & Calculations  asc Aorta Diam  2.6 cm                IVSd  0.94 cm                            LA dim  3.7 cm           LVIDd  3.6 cm  LVIDs  1.27 cm                          LVPWd  1.07 cm  Doppler Measurements & Calculations  TR max PG  8.6 mmHg                    TR max vel  146.9 cm-sec  Other Measurements & Calculations  BSA  1.31 m  ___________________________________________________________________________  ___                                          MD Gerhardt Mana, MD, [phone]         10-11-2024, 1  23 PM    CT Head WO Contrast W Quant CT Tiss Character When Performed  Final Result by Elvie Laurier Shivers, MD (12/27 0156)  Date of Service: 2024-10-08 22:03:00    EXAM:  CT Head Without Intravenous Contrast.     CLINICAL HISTORY:  Infection, hypernatremia.    TECHNIQUE:  Axial computed tomography images of the head/brain without intravenous   contrast.     COMPARISON:  None provided.    FINDINGS:    BRAIN:  Patient significantly rotated.  No acute intraparenchymal hemorrhage. No  mass lesion. No CT evidence for   acute territorial infarct. No midline shift or extra-axial collection.     VENTRICLES:  No hydrocephalus.     ORBITS:  The orbits are unremarkable.     SINUSES AND MASTOIDS:  The paranasal sinuses and mastoid air cells are clear.     SOFT TISSUES:  No significant facial or scalp soft tissue swelling evident. No radiopaque   foreign body is seen.     BONES:  No acute skull fracture.     MISCELLANEOUS:  Atherosclerotic calcifications are noted in the visualized intracranial   vasculature.     IMPRESSION:    No acute intracranial abnormality.    Electronically signed by: Elvie Shivers, MD on 10/09/2024 01:56:36 AM   US [place]  Per PQRS, all CT exams are performed using one or more of the following  dose reduction techniques: automated exposure control, adjustment of the   mA and/or kV according to patient size, or use of iterative reconstruction   technique.      CT Chest Abdomen Pelvis WO Contrast  Final Result by Elvie Laurier Shivers, MD (12/27 0218)  Date of Service: 2024-10-08 22:03:00    EXAM:  CT Chest Without Intravenous Contrast.     CLINICAL HISTORY:  Infection, unclear source, hypernatremia, severe.    TECHNIQUE:  Axial computed tomography images of the chest without intravenous   contrast.     COMPARISON:  None provided.    FINDINGS:    LUNGS:  Tree-in-bud opacities in the dependent right lower lobe. Atelectatic   airspace opacities is a left lower lobe. Volume loss in left lower lobe   due to enlarged cardiac silhouette.     PLEURAL SPACES:  There is an intermediate sized pericardial effusion.     HEART AND MEDIASTINUM:  No cardiomegaly. No significant pericardial effusion.     LYMPH NODES:  No lymphadenopathy.     CHEST WALL AND UPPER ABDOMEN:  Gastrostomy tube is located in the proximal stomach.   Foley catheter is present in the bladder. 5 multiple calcified stones   noted within the dependent  bladder and distal right ureter. There are   calcified stones adjacent to left UVJ that are indistinguishable from   intraureteral from vascular without contrast. No significant   hydronephrosis within the kidneys. There is left nephrolithiasis.     BONES:  No acute osseous abnormality.     IMPRESSION:    Pulmonary pattern suggesting right lower lobe aspiration and/or aspiration   pneumonia and probably left lower lobe pneumonia  Multiple bladder calcifications with calcified stones in the distal right   ureter.  No significant right hydronephrosis  Study indeterminate for the presence of calcified stone in the distal left   ureter without the presence of contrast.  No evidence for left   hydronephrosis  Nonspecific pericardial effusion    Electronically signed by: Elvie Shivers, MD on 10/09/2024 02:18:25 AM   US [place]ette  Per PQRS, all CT exams are performed using one or more of the following   dose reduction techniques: automated exposure control, adjustment of the   mA and/or kV according to patient size, or use of iterative reconstruction   technique.      XR Chest 1 View  Final Result by Elvie Laurier Shivers, MD (12/26 2239)  Date of Service: 2024-10-08 18:37:00    EXAM:  XR Chest, 1 View.     CLINICAL HISTORY:  Dyspnea.    COMPARISON:  None provided.    FINDINGS:    LUNGS AND PLEURA:  Patient's head overlies the left upper thorax and obscures detail.   Retrocardiac opacity extends from the hilum to the posterior medial   diaphragm. Ill-defined groundglass opacities in the right suprahilar   region.     HEART AND MEDIASTINUM:  The heart size and mediastinal contours are normal.     BONES:  No acute osseous abnormality.     MISCELLANEOUS:  Gastrostomy tube is present.     IMPRESSION:    Retrocardiac opacity that may represent atelectasis or airspace disease.  Ill-defined groundglass opacity in right suprahilar region that may   represent aspiration or  early pneumonia    Electronically signed by: Elvie Shivers, MD on 10/08/2024 10:39:49 PM   US [place]ette     CULTURES:  Results for orders placed or performed during the hospital encounter of 10/08/24 (  from the past week)  Blood Culture, 1st Set   Specimen: Venous; Blood  Result Value Ref Range   Blood Culture Staphylococcus hominis (A)    Blood Culture Staphylococcus epidermidis (A)    Gram Stain Result Gram positive cocci in clusters (AA)    Gram Stain Result Gram positive cocci in clusters (AA)    Gram Stain Result (AA)     From Anaerobic Bottle Gram positive cocci in clusters      Susceptibility   Staphylococcus epidermidis - MIC    MALDI ID      Staphylococcus hominis - MIC    MALDI ID     Rapid Blood Culture Identification   Specimen: Venous; Blood  Result Value Ref Range   Acinetobacter calcoaceticus -baumannii complex PCR Not Detected Not Detected   Bacteroides fragilis PCR Not Detected Not Detected   Enterobacterales PCR Not Detected Not Detected   Enterobacter cloacae complex PCR Not Detected Not Detected   Escherichia coli PCR Not Detected Not Detected   Klebsiella aerogenes PCR Not Detected Not Detected   Klebsiella oxytoca PCR Not Detected Not Detected   Klebsiella pneumoniae Group PCR Not Detected Not Detected   Proteus species PCR Not Detected Not Detected   Salmonella species PCR Not Detected Not Detected   Serratia marcescens PCR Not Detected Not Detected   Haemophilus influenzae PCR Not Detected Not Detected   Neisseria meningitidis PCR Not Detected Not Detected   Pseudomonas aeruginosa PCR Not Detected Not Detected   Stenotrophomonas maltophilia PCR Not Detected Not Detected   Enterococcus faecalis PCR Not Detected Not Detected   Enterococcus faecium PCR Not Detected Not Detected   Staphylococcus PCR Detected (A) Not Detected   Staphylococcus aureus PCR Not Detected Not Detected   Staphylococcus epidermidis PCR Detected (A) Not Detected   Staphylococcus  lugdunensis PCR Not Detected Not Detected   Streptococcus PCR Not Detected Not Detected   Streptococcus agalactiae PCR Not Detected Not Detected   Streptococcus pneumoniae PCR Not Detected Not Detected   Streptococcus pyogenes PCR Not Detected Not Detected   Candida albicans PCR Not Detected Not Detected   Candida auris PCR Not Detected Not Detected   Candida glabrata PCR Not Detected Not Detected   Candida krusei PCR Not Detected Not Detected   Candida parapsilosis PCR Not Detected Not Detected   Candida tropicalis PCR Not Detected Not Detected   Cryptococcus neoformans/gatti PCR Not Detected Not Detected   MecA/C Resistant Gene Detected (A) Not Detected, Not Applicable   Listeria Monocytogenes PCR Not Detected Not Detected  Blood Culture, 2nd Set   Specimen: Venous; Blood  Result Value Ref Range   Blood Culture No growth after 3 days.   Urine Culture   Specimen: Clean Catch; Urine  Result Value Ref Range   Urine Culture      Mixed bacterial flora. Multiple bacterial morphotypes are indicative of contamination or colonization. Suggest appropriate recollection with timely delivery to the laboratory, if clinically indicated.     Consults:   IP CONSULT TO NUTRITION SERVICES IP CONSULT TO NUTRITION SERVICES   Follow-up Appointments:    No future appointments.       Electronically signed by: Roddie Curtistine Hamilton, MD 10/12/2024 1:49 PM Time spent on discharge: 40 minutes.

## 2024-10-13 ENCOUNTER — Telehealth: Payer: Self-pay | Admitting: *Deleted

## 2024-10-13 NOTE — Transitions of Care (Post Inpatient/ED Visit) (Signed)
 "  10/13/2024  Name: Cindy Brooks MRN: [id number] DOB: Oct 22, 1970  Today's TOC FU Call Status: Today's TOC FU Call Status:: Successful TOC FU Call Completed TOC FU Call Complete Date: 10/13/24  Patient's Name and Date of Birth confirmed. Name, DOB (verified with Dtr/[place])  Transition Care Management Follow-up Telephone Call Date of Discharge: 10/12/24 Discharge Facility: Other ([facility]) Name of Other ([facility]) Discharge Facility: [facility] Type of Discharge: Inpatient Admission Primary Inpatient Discharge Diagnosis:: hypernatremia How have you been since you were released from the hospital?: Better Any questions or concerns?: No  Items Reviewed: Did you receive and understand the discharge instructions provided?: Yes Medications obtained,verified, and reconciled?: Yes (Medications Reviewed) Any new allergies since your discharge?: No Dietary orders reviewed?: Yes Type of Diet Ordered:: Patient may have small bites of soft food. Recieves nutritional supplement 4 times daily via G-tube. Do you have support at home?: Yes People in Home [RPT]: child(ren), adult Name of Support/Comfort Primary Source: [place]/Dtr-HCPOA  Medications Reviewed Today: Medications Reviewed Today     Reviewed by Lucky Andrea LABOR, RN (Registered Nurse) on 10/13/24 at 1053  Med List Status: <None>   Medication Order Taking? Sig Documenting Provider Last Dose Status Informant  amoxicillin-clavulanate (AUGMENTIN) 500-125 MG tablet [id number] Yes Take 1 tablet by mouth 2 (two) times daily. [provider]  Active   aspirin  81 MG chewable tablet [id number] Yes Chew 1 tablet (81 mg total) by mouth daily.  Patient taking differently: Place 81 mg into feeding tube daily.   King, Olivia, DO  Active   atorvastatin  (LIPITOR ) 80 MG tablet [id number] Yes Take 1 tablet (80 mg total) by mouth daily.  Patient taking differently: Place 80 mg into feeding tube daily.   Myrna Bitters, DO  Active   folic  acid (FOLVITE ) 1 MG tablet [id number] Yes Take 1 tablet (1 mg total) by mouth daily.  Patient taking differently: Place 1 mg into feeding tube daily.   Newlin, Enobong, MD  Active Child, Pharmacy Records  gabapentin  (NEURONTIN ) 100 MG capsule [id number] Yes Take 1 capsule (100 mg total) by mouth 2 (two) times daily.  Patient taking differently: Take 100 mg by mouth 2 (two) times daily. Per tube   Delbert Clam, MD  Active Child, Pharmacy Records  guaifenesin  (ROBITUSSIN) 100 MG/5ML syrup [id number] Yes Take 200 mg by mouth 3 (three) times daily as needed for cough. [provider]  Active   ipratropium-albuterol  (DUONEB) 0.5-2.5 (3) MG/3ML SOLN [id number] Yes Take 3 mLs by nebulization every 6 (six) hours as needed. Gonfa, Taye T, MD  Active Pharmacy Records, Child  Multiple Vitamin (MULTIVITAMIN WITH MINERALS) TABS tablet [id number] Yes Take 1 tablet by mouth daily.  Patient taking differently: Place 1 tablet into feeding tube daily.   Gonfa, Taye T, MD  Active Pharmacy Records, Child  Nutritional Supplements (FEEDING SUPPLEMENT, OSMOLITE 1.5 CAL,) LIQD [id number] Yes Place 237 mLs into feeding tube 4 (four) times daily. 1 carton Osmolite 1.5 (237 mL) - QID - Administer slowly over 15-20 minutes via gravity - Free water  flush: 30 mL before and after each bolus feed + 180 mL between feeds x 4 King, Olivia, DO  Active   Nystatin  (GERHARDT'S BUTT CREAM) CREA [id number] Yes Apply 1 application. topically daily as needed for irritation. Gonfa, Taye T, MD  Active Pharmacy Records, Child  senna-docusate (SENOKOT-S) 8.6-50 MG tablet [id number] Yes Take 1 tablet by mouth 2 (two) times daily between meals as needed for mild constipation.  Patient taking differently: Place 1  tablet into feeding tube 2 (two) times daily between meals as needed for mild constipation.   Gonfa, Taye T, MD  Active Pharmacy Records, Child  Water  For Irrigation, Sterile (FREE WATER ) LARRAINE [id number] Yes Place 180 mLs into feeding  tube 4 (four) times daily. Between meals Myrna Bitters, DO  Active   Water  For Irrigation, Sterile (FREE WATER ) LARRAINE [id number] Yes Place 60 mLs into feeding tube 4 (four) times daily. With meals Myrna Bitters, DO  Active   Med List Note Marisa Nathanel SAILOR, CPhT 01/20/22 2023): The patient's last name is pronounced Kurse (one syllable)            Home Care and Equipment/Supplies: Were Home Health Services Ordered?: NA (Patient will resume PCS with [facility] for 25 hrs a week) Any new equipment or medical supplies ordered?: NA (no new equiptment needed)  Functional Questionnaire: Do you need assistance with bathing/showering or dressing?: Yes (PCS) Do you need assistance with meal preparation?: Yes (Family prepares meals) Do you need assistance with eating?: No Do you have difficulty maintaining continence: Yes (foley inplace, adult briefs) Do you need assistance with getting out of bed/getting out of a chair/moving?: Yes Do you have difficulty managing or taking your medications?: Yes (Dtr manages medications)  Follow up appointments reviewed: PCP Follow-up appointment confirmed?: No (RNCM assisted with scheduling on 10/27/24) MD Provider Line Number:[phone] Given: No Specialist Hospital Follow-up appointment confirmed?: NA Do you need transportation to your follow-up appointment?: No Do you understand care options if your condition(s) worsen?: Yes-patient verbalized understanding  SDOH Interventions Today    Flowsheet Row Most Recent Value  SDOH Interventions   Food Insecurity Interventions Intervention Not Indicated  Housing Interventions Intervention Not Indicated  Transportation Interventions Intervention Not Indicated  Utilities Interventions Intervention Not Indicated    Goals Addressed             This Visit's Progress    VBCI Transitions of Care (TOC) Care Plan       Problems:  Recent Hospitalization for treatment of Hypernatremia No Hospital follow up  with PCP-RNCM will assist with scheduling  Goal:  Over the next 30 days, the patient will not experience hospital readmission  Interventions:  Transitions of Care: Durable Medical Equipment (DME) reviewed with patient/caregiver Doctor Visits  - discussed the importance of doctor visits Reviewed Signs and symptoms of infection SDOH assessment Medication review-Epic updated Assisted with scheduling Hospital follow up with PCP on 10/27/24 Reviewed upcoming outpatient therapy appointments on 10/28/24 and 10/29/24 Verified patient receiving PCS 5 days a week, family provides care the remaining time  Patient Self Care Activities:  Attend all scheduled provider appointments Call provider office for new concerns or questions  Notify RN Care Manager of [facility] call rescheduling needs Participate in Transition of Care Program/Attend TOC scheduled calls Take medications as prescribed    Plan:  Telephone follow up appointment with care management team member scheduled for:  10/20/24 at 12:45pm       Discussed and offered 30 day TOC program.  Patient/Daughter/[place]      enrolled        .  The patient has been provided with contact information for the care management team and has been advised to call with any health -related questions or concerns.  The patient verbalized understanding with current plan of care.  The patient is directed to their insurance card regarding availability of benefits coverage.   Andrea Dimes RN, BSN [place]  [facility]  RN Care Manager [phone]  "

## 2024-10-15 NOTE — Progress Notes (Signed)
 Transitional Care Management Telephone Call Attempt  Discharge Date: 10/12/24 Discharge Location: [facility]  Call Attempt Date: 10/15/2024 Call Attempt: First

## 2024-10-20 ENCOUNTER — Encounter: Payer: Self-pay | Admitting: *Deleted

## 2024-10-20 ENCOUNTER — Telehealth: Payer: Self-pay | Admitting: *Deleted

## 2024-10-21 ENCOUNTER — Encounter: Payer: Self-pay | Admitting: *Deleted

## 2024-10-21 NOTE — Therapy (Incomplete)
 " OUTPATIENT OCCUPATIONAL THERAPY NEURO EVALUATION  Patient Name: Cindy Brooks MRN: [id number] DOB:[birth year], 54 y.o., female Today's Date: 10/21/2024  PCP: Dr. Corrina Skye REFERRING PROVIDER: Shawn Sick, MD  END OF SESSION:   Past Medical History:  Diagnosis Date   Hypertension    Hypertensive urgency 01/20/2022   Seizure (HCC) 01/28/2022   Stroke [facility])    TIA (transient ischemic attack) 2019   Urinary tract infection 01/28/2022   Past Surgical History:  Procedure Laterality Date   ESOPHAGOGASTRODUODENOSCOPY N/A 01/28/2022   Procedure: ESOPHAGOGASTRODUODENOSCOPY (EGD);  Surgeon: Paola Dreama SAILOR, MD;  Location: [facility] ENDOSCOPY;  Service: General;  Laterality: N/A;   IR GASTROSTOMY TUBE MOD SED  08/29/2022   IR REPLC GASTRO/COLONIC TUBE PERCUT W/FLUORO  08/31/2024   PEG PLACEMENT N/A 01/28/2022   Procedure: PERCUTANEOUS ENDOSCOPIC GASTROSTOMY (PEG) PLACEMENT;  Surgeon: Paola Dreama SAILOR, MD;  Location: [facility] ENDOSCOPY;  Service: General;  Laterality: N/A;   Patient Active Problem List   Diagnosis Date Noted   Physical deconditioning 09/01/2024   Severe protein-calorie malnutrition 09/01/2024   Pressure injury of skin 09/01/2024   PEG tube malfunction (HCC) 08/31/2024   CKD (chronic kidney disease) 08/31/2024   Chronic indwelling Foley catheter 08/31/2024   Malnutrition of moderate degree 08/31/2024   Hypomagnesemia 08/20/2022   Mucus plug in respiratory tract 08/20/2022   Dysphagia 08/20/2022   Community acquired bacterial pneumonia 08/15/2022   Sepsis (HCC) 05/25/2022   ARF (acute renal failure) 05/25/2022   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) 05/14/2022   Catheter-associated urinary tract infection 04/20/2022   Sepsis secondary to UTI (HCC) 04/19/2022   Acute encephalopathy 03/24/2022   Tachycardia    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   Dysphagia as late effect of stroke 03/20/2022   Acute urinary retention 03/20/2022   Normocytic anemia 03/20/2022   CVA  (cerebral vascular accident) (HCC) 03/20/2022   Hypernatremia 02/07/2022   Coma (HCC) 02/07/2022   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  Rationale for Evaluation and Treatment: Rehabilitation  SUBJECTIVE:   SUBJECTIVE STATEMENT: *** Pt accompanied by: {accompnied:27141}  PERTINENT HISTORY: 53yo woman with history of bilateral ischemic stroke in 2022 with residual dysphagia requiring PEG tube, paraplegia, chronic indwelling Foley catheter, hypertension, hyperlipidemia, malnutrition, and physical deconditioning, who was admitted on 10/08/2024 and found to have severe hypernatremia, acute kidney injury lactic acidosis, leukocytosis and fever with concern for sepsis.  Physical therapy assessment revealed significant decline in functional mobility from prior baseline, with current status requiring maximal to total assistance for bed mobility, transfers, and ambulation, and recommendations for post-acute rehabilitation or home health with appropriate equipment and support. Family elected for home with outpatient PT/OT/SLP.   Previously admitted 08/30/24 when she presented when her PEG tube became dislodged.       ***  PRECAUTIONS: {Therapy precautions:24002}  Chronic indwelling foley catheter, PEG tube, soft and bite sized and thin liquid diet (sit upright and fed slowly)  WEIGHT BEARING RESTRICTIONS: {Yes ***/No:24003}  PAIN:  Are you having pain? {OPRCPAIN:27236}  FALLS: Has patient fallen in last 6 months?  {fallsyesno:27318}  LIVING ENVIRONMENT: Lives with: {OPRC lives with:25569::lives with their family} Lives in: {Lives in:25570} Stairs: {opstairs:27293} Has following equipment at home: {Assistive devices:23999}  PLOF: {PLOF:24004}  PATIENT GOALS: ***  OBJECTIVE:  Note: Objective measures were completed at Evaluation unless otherwise noted.  HAND DOMINANCE: {MISC; OT HAND [phone]}  ADLs: Overall ADLs: *** Transfers/ambulation related to ADLs: Eating: *** Grooming: *** UB Dressing: *** LB Dressing: *** Toileting: *** Bathing: *** Tub Shower transfers: *** Equipment: {equipment:25573}  IADLs: Shopping: *** Light housekeeping: *** Meal Prep: *** Community mobility: *** Medication management: *** Financial management: *** Handwriting: {OTWRITTENEXPRESSION:25361}  MOBILITY STATUS: {OTMOBILITY:25360}  POSTURE COMMENTS:  {posture:25561} Sitting balance: {sitting balance:25483}  ACTIVITY TOLERANCE: Activity tolerance: ***  FUNCTIONAL OUTCOME MEASURES: {OTFUNCTIONALMEASURES:27238}  UPPER EXTREMITY ROM:    {AROM/PROM:27142} ROM Right eval Left eval  Shoulder flexion    Shoulder abduction    Shoulder adduction    Shoulder extension    Shoulder internal rotation    Shoulder external rotation    Elbow flexion    Elbow extension    Wrist flexion    Wrist extension    Wrist ulnar deviation    Wrist radial deviation    Wrist pronation    Wrist supination    (Blank rows = not tested)  UPPER EXTREMITY MMT:     MMT Right eval Left eval  Shoulder flexion    Shoulder abduction    Shoulder adduction    Shoulder extension    Shoulder internal rotation    Shoulder external rotation    Middle trapezius    Lower trapezius    Elbow flexion    Elbow extension    Wrist flexion    Wrist extension    Wrist ulnar deviation    Wrist radial deviation    Wrist pronation    Wrist supination    (Blank rows = not tested)  HAND  FUNCTION: {handfunction:27230}  COORDINATION: {otcoordination:27237}  SENSATION: {sensation:27233}  EDEMA: ***  MUSCLE TONE: {UETONE:25567}  COGNITION: Overall cognitive status: {cognition:24006}  VISION: Subjective report: *** Baseline vision: {OTBASELINEVISION:25363} Visual history: {OTVISUALHISTORY:25364}  VISION ASSESSMENT: {visionassessment:27231}  Patient has difficulty with following activities due to following visual impairments: ***  PERCEPTION: {Perception:25564}  PRAXIS: {Praxis:25565}  OBSERVATIONS: ***                                                                                                                             TREATMENT DATE: ***         PATIENT EDUCATION: Education details: *** Person educated: {Person educated:25204} Education method: {Education Method:25205} Education comprehension: {Education Comprehension:25206}  HOME EXERCISE PROGRAM: ***   GOALS: Goals reviewed with patient? {yes/no:20286}  SHORT TERM GOALS: Target date: ***  *** Baseline: Goal status: INITIAL  2.  *** Baseline:  Goal status: INITIAL  3.  *** Baseline:  Goal status: INITIAL  4.  *** Baseline:  Goal status: INITIAL  5.  *** Baseline:  Goal status: INITIAL  6.  *** Baseline:  Goal status: INITIAL  LONG TERM GOALS: Target date: ***  *** Baseline:  Goal status: INITIAL  2.  *** Baseline:  Goal status: INITIAL  3.  *** Baseline:  Goal status: INITIAL  4.  *** Baseline:  Goal status: INITIAL  5.  *** Baseline:  Goal status: INITIAL  6.  *** Baseline:  Goal status: INITIAL  ASSESSMENT:  CLINICAL IMPRESSION: Patient is a *** y.o. *** who was seen today for occupational therapy evaluation for ***.   PERFORMANCE DEFICITS: in functional skills including {OT physical skills:25468}, cognitive skills including {OT cognitive skills:25469}, and psychosocial skills including {OT psychosocial skills:25470}.   IMPAIRMENTS:  are limiting patient from {OT performance deficits:25471}.   CO-MORBIDITIES: {Comorbidities:25485} that affects occupational performance. Patient will benefit from skilled OT to address above impairments and improve overall function.  MODIFICATION OR ASSISTANCE TO COMPLETE EVALUATION: {OT modification:25474}  OT OCCUPATIONAL PROFILE AND HISTORY: {OT PROFILE AND HISTORY:25484}  CLINICAL DECISION MAKING: {OT CDM:25475}  REHAB POTENTIAL: {rehabpotential:25112}  EVALUATION COMPLEXITY: {Evaluation complexity:25115}    PLAN:  OT FREQUENCY: {rehab frequency:25116}  OT DURATION: {rehab duration:25117}  PLANNED INTERVENTIONS: {OT Interventions:25467}  RECOMMENDED OTHER SERVICES: ***  CONSULTED AND AGREED WITH PLAN OF CARE: {ENR:74513}  PLAN FOR NEXT SESSION: PIERRETTE ONEITA SLOUGH, OTR/L 10/21/2024, 5:16 PM           "

## 2024-10-22 ENCOUNTER — Ambulatory Visit: Admitting: Occupational Therapy

## 2024-10-22 ENCOUNTER — Encounter: Payer: Self-pay | Admitting: *Deleted

## 2024-10-27 ENCOUNTER — Ambulatory Visit: Payer: Self-pay | Admitting: *Deleted

## 2024-11-19 ENCOUNTER — Inpatient Hospital Stay (HOSPITAL_COMMUNITY)

## 2024-11-19 ENCOUNTER — Other Ambulatory Visit: Payer: Self-pay

## 2024-11-19 ENCOUNTER — Encounter (HOSPITAL_COMMUNITY): Payer: Self-pay

## 2024-11-19 ENCOUNTER — Emergency Department (HOSPITAL_COMMUNITY)

## 2024-11-19 ENCOUNTER — Inpatient Hospital Stay (HOSPITAL_COMMUNITY): Admission: EM | Admit: 2024-11-19 | Source: Home / Self Care

## 2024-11-19 DIAGNOSIS — J189 Pneumonia, unspecified organism: Principal | ICD-10-CM

## 2024-11-19 DIAGNOSIS — J9601 Acute respiratory failure with hypoxia: Secondary | ICD-10-CM | POA: Diagnosis present

## 2024-11-19 LAB — CBC WITH DIFFERENTIAL/PLATELET
Abs Immature Granulocytes: 0.03 10*3/uL (ref 0.00–0.07)
Basophils Absolute: 0 10*3/uL (ref 0.0–0.1)
Basophils Relative: 0 %
Eosinophils Absolute: 0 10*3/uL (ref 0.0–0.5)
Eosinophils Relative: 0 %
HCT: 45 % (ref 36.0–46.0)
Hemoglobin: 13.9 g/dL (ref 12.0–15.0)
Immature Granulocytes: 0 %
Lymphocytes Relative: 9 %
Lymphs Abs: 0.9 10*3/uL (ref 0.7–4.0)
MCH: 28.5 pg (ref 26.0–34.0)
MCHC: 30.9 g/dL (ref 30.0–36.0)
MCV: 92.2 fL (ref 80.0–100.0)
Monocytes Absolute: 0.3 10*3/uL (ref 0.1–1.0)
Monocytes Relative: 3 %
Neutro Abs: 9.1 10*3/uL — ABNORMAL HIGH (ref 1.7–7.7)
Neutrophils Relative %: 88 %
Platelets: 106 10*3/uL — ABNORMAL LOW (ref 150–400)
RBC: 4.88 MIL/uL (ref 3.87–5.11)
RDW: 16.3 % — ABNORMAL HIGH (ref 11.5–15.5)
WBC: 10.4 10*3/uL (ref 4.0–10.5)
nRBC: 0 % (ref 0.0–0.2)

## 2024-11-19 LAB — URINALYSIS, W/ REFLEX TO CULTURE (INFECTION SUSPECTED)
Bilirubin Urine: NEGATIVE
Glucose, UA: NEGATIVE mg/dL
Hgb urine dipstick: NEGATIVE
Ketones, ur: NEGATIVE mg/dL
Nitrite: NEGATIVE
Protein, ur: NEGATIVE mg/dL
Specific Gravity, Urine: 1.014 (ref 1.005–1.030)
pH: 5 (ref 5.0–8.0)

## 2024-11-19 LAB — BASIC METABOLIC PANEL WITH GFR
BUN: 95 mg/dL — ABNORMAL HIGH (ref 6–20)
CO2: 25 mmol/L (ref 22–32)
Calcium: 9.7 mg/dL (ref 8.9–10.3)
Chloride: >130 mmol/L (ref 98–111)
Creatinine, Ser: 2.58 mg/dL — ABNORMAL HIGH (ref 0.44–1.00)
GFR, Estimated: 21 mL/min — ABNORMAL LOW
Glucose, Bld: 68 mg/dL — ABNORMAL LOW (ref 70–99)
Potassium: 4.7 mmol/L (ref 3.5–5.1)
Sodium: >170 mmol/L (ref 135–145)

## 2024-11-19 LAB — COMPREHENSIVE METABOLIC PANEL WITH GFR
ALT: 114 U/L — ABNORMAL HIGH (ref 0–44)
AST: 86 U/L — ABNORMAL HIGH (ref 15–41)
Albumin: 4.1 g/dL (ref 3.5–5.0)
Alkaline Phosphatase: 103 U/L (ref 38–126)
BUN: 94 mg/dL — ABNORMAL HIGH (ref 6–20)
CO2: 25 mmol/L (ref 22–32)
Calcium: 9.7 mg/dL (ref 8.9–10.3)
Chloride: >130 mmol/L (ref 98–111)
Creatinine, Ser: 2.62 mg/dL — ABNORMAL HIGH (ref 0.44–1.00)
GFR, Estimated: 21 mL/min — ABNORMAL LOW
Glucose, Bld: 172 mg/dL — ABNORMAL HIGH (ref 70–99)
Potassium: 4.6 mmol/L (ref 3.5–5.1)
Sodium: >170 mmol/L (ref 135–145)
Total Bilirubin: 0.5 mg/dL (ref 0.0–1.2)
Total Protein: 8.3 g/dL — ABNORMAL HIGH (ref 6.5–8.1)

## 2024-11-19 LAB — MRSA NEXT GEN BY PCR, NASAL: MRSA by PCR Next Gen: NOT DETECTED

## 2024-11-19 LAB — HIV ANTIBODY (ROUTINE TESTING W REFLEX): HIV Screen 4th Generation wRfx: NONREACTIVE

## 2024-11-19 LAB — OSMOLALITY: Osmolality: 401 mosm/kg (ref 275–295)

## 2024-11-19 LAB — I-STAT CG4 LACTIC ACID, ED
Lactic Acid, Venous: 3 mmol/L (ref 0.5–1.9)
Lactic Acid, Venous: 1.4 mmol/L (ref 0.5–1.9)

## 2024-11-19 LAB — GLUCOSE, CAPILLARY
Glucose-Capillary: 132 mg/dL — ABNORMAL HIGH (ref 70–99)
Glucose-Capillary: 184 mg/dL — ABNORMAL HIGH (ref 70–99)

## 2024-11-19 LAB — RESP PANEL BY RT-PCR (RSV, FLU A&B, COVID)  RVPGX2
Influenza A by PCR: NEGATIVE
Influenza B by PCR: NEGATIVE
Resp Syncytial Virus by PCR: NEGATIVE
SARS Coronavirus 2 by RT PCR: NEGATIVE

## 2024-11-19 MED ORDER — LACTATED RINGERS IV BOLUS
1000.0000 mL | Freq: Once | INTRAVENOUS | Status: AC
  Administered 2024-11-19: 1000 mL via INTRAVENOUS

## 2024-11-19 MED ORDER — VANCOMYCIN VARIABLE DOSE PER UNSTABLE RENAL FUNCTION (PHARMACIST DOSING): Status: AC

## 2024-11-19 MED ORDER — OSMOLITE 1.5 CAL PO LIQD
1000.0000 mL | ORAL | Status: AC
  Administered 2024-11-19: 1000 mL
  Filled 2024-11-19: qty 1000

## 2024-11-19 MED ORDER — INSULIN ASPART 100 UNIT/ML IJ SOLN
0.0000 [IU] | INTRAMUSCULAR | Status: AC
  Administered 2024-11-19: 2 [IU] via SUBCUTANEOUS
  Administered 2024-11-19: 1 [IU] via SUBCUTANEOUS
  Filled 2024-11-19: qty 1
  Filled 2024-11-19: qty 2

## 2024-11-19 MED ORDER — HEPARIN SODIUM (PORCINE) 5000 UNIT/ML IJ SOLN
5000.0000 [IU] | Freq: Three times a day (TID) | INTRAMUSCULAR | Status: AC
  Administered 2024-11-19 (×2): 5000 [IU] via SUBCUTANEOUS
  Filled 2024-11-19 (×2): qty 1

## 2024-11-19 MED ORDER — FREE WATER
300.0000 mL | Status: AC
  Administered 2024-11-19 (×2): 300 mL

## 2024-11-19 MED ORDER — SODIUM CHLORIDE 0.9 % IV SOLN
1.0000 g | INTRAVENOUS | Status: AC
  Administered 2024-11-19: 1 g via INTRAVENOUS
  Filled 2024-11-19: qty 10

## 2024-11-19 MED ORDER — CHLORHEXIDINE GLUCONATE CLOTH 2 % EX PADS
6.0000 | MEDICATED_PAD | Freq: Every day | CUTANEOUS | Status: AC
  Administered 2024-11-19: 6 via TOPICAL

## 2024-11-19 MED ORDER — LEVOFLOXACIN IN D5W 750 MG/150ML IV SOLN
750.0000 mg | Freq: Once | INTRAVENOUS | Status: AC
  Administered 2024-11-19: 750 mg via INTRAVENOUS
  Filled 2024-11-19: qty 150

## 2024-11-19 MED ORDER — POLYETHYLENE GLYCOL 3350 17 G PO PACK: 17.0000 g | PACK | Freq: Every day | ORAL | Status: AC | PRN

## 2024-11-19 MED ORDER — PROSOURCE TF20 ENFIT COMPATIBL EN LIQD
60.0000 mL | Freq: Every day | ENTERAL | Status: AC
  Administered 2024-11-19: 60 mL
  Filled 2024-11-19: qty 60

## 2024-11-19 MED ORDER — VANCOMYCIN HCL IN DEXTROSE 1-5 GM/200ML-% IV SOLN
1000.0000 mg | Freq: Once | INTRAVENOUS | Status: AC
  Administered 2024-11-19: 1000 mg via INTRAVENOUS
  Filled 2024-11-19: qty 200

## 2024-11-19 MED ORDER — DEXTROSE 5 % IV SOLN: INTRAVENOUS | Status: AC

## 2024-11-19 MED ORDER — SODIUM CHLORIDE 0.9 % IV BOLUS: 1000.0000 mL | Freq: Once | INTRAVENOUS | Status: DC

## 2024-11-19 MED ORDER — SENNA 8.6 MG PO TABS: 1.0000 | ORAL_TABLET | Freq: Two times a day (BID) | ORAL | Status: AC | PRN

## 2024-11-19 NOTE — Progress Notes (Addendum)
 eLink Physician-Brief Progress Note Patient Name: Cindy Brooks DOB: 11-Jul-1971 MRN: [id number]   Date of Service  11/19/2024  HPI/Events of Note  Request order for foley change.  Osm 401  eICU Interventions  Order placed.  On D5@125 /hr. Will follow Na q4h        Ailen Strauch 11/19/2024, 7:20 PM

## 2024-11-19 NOTE — ED Provider Notes (Signed)
 " [place] EMERGENCY DEPARTMENT AT [facility] Provider Note   CSN: [id number] Arrival date & time: 11/19/24  1111     Patient presents with: Hyperventilating   Cindy Brooks is a 54 y.o. female.  With a history of stroke, right-sided deficits and dysphagia, seizure, hypertension, CKD and encephalopathy who presents to the ED for concern of infection.  Daughter at home voiced concern for pneumonia as patient has been breathing fast.  Also noted to be tachycardic.  Neurologically she is at baseline with persistent right sided deficits and left lean.  Patient is awake alert but unable to contribute additional history at this time secondary to clinical condition   HPI     Prior to Admission medications  Medication Sig Start Date End Date Taking? Authorizing Provider  amoxicillin-clavulanate (AUGMENTIN) 500-125 MG tablet Take 1 tablet by mouth 2 (two) times daily.    [provider]  aspirin  81 MG chewable tablet Chew 1 tablet (81 mg total) by mouth daily. Patient taking differently: Place 81 mg into feeding tube daily. 09/07/24   King, Olivia, DO  atorvastatin  (LIPITOR ) 80 MG tablet Take 1 tablet (80 mg total) by mouth daily. Patient taking differently: Place 80 mg into feeding tube daily. 09/06/24   King, Olivia, DO  folic acid  (FOLVITE ) 1 MG tablet Take 1 tablet (1 mg total) by mouth daily. Patient taking differently: Place 1 mg into feeding tube daily. 05/07/23   Newlin, Enobong, MD  gabapentin  (NEURONTIN ) 100 MG capsule Take 1 capsule (100 mg total) by mouth 2 (two) times daily. Patient taking differently: Take 100 mg by mouth 2 (two) times daily. Per tube 08/26/24   Delbert Clam, MD  guaifenesin  (ROBITUSSIN) 100 MG/5ML syrup Take 200 mg by mouth 3 (three) times daily as needed for cough.    [provider]  ipratropium-albuterol  (DUONEB) 0.5-2.5 (3) MG/3ML SOLN Take 3 mLs by nebulization every 6 (six) hours as needed. 03/21/22   Gonfa, Taye T, MD  Multiple  Vitamin (MULTIVITAMIN WITH MINERALS) TABS tablet Take 1 tablet by mouth daily. Patient taking differently: Place 1 tablet into feeding tube daily. 03/21/22   Gonfa, Taye T, MD  Nutritional Supplements (FEEDING SUPPLEMENT, OSMOLITE 1.5 CAL,) LIQD Place 237 mLs into feeding tube 4 (four) times daily. 1 carton Osmolite 1.5 (237 mL) - QID - Administer slowly over 15-20 minutes via gravity - Free water  flush: 30 mL before and after each bolus feed + 180 mL between feeds x 4 09/06/24   Myrna Bitters, DO  Nystatin  (GERHARDT'S BUTT CREAM) CREA Apply 1 application. topically daily as needed for irritation. 03/21/22   Gonfa, Taye T, MD  senna-docusate (SENOKOT-S) 8.6-50 MG tablet Take 1 tablet by mouth 2 (two) times daily between meals as needed for mild constipation. Patient taking differently: Place 1 tablet into feeding tube 2 (two) times daily between meals as needed for mild constipation. 03/21/22   Gonfa, Taye T, MD  Water  For Irrigation, Sterile (FREE WATER ) SOLN Place 180 mLs into feeding tube 4 (four) times daily. Between meals 09/06/24   Myrna Bitters, DO  Water  For Irrigation, Sterile (FREE WATER ) SOLN Place 60 mLs into feeding tube 4 (four) times daily. With meals 09/06/24   Myrna Bitters, DO    Allergies: Codeine, Penicillins, Strawberry extract, and Aspirin     Review of Systems  Updated Vital Signs BP 101/84   Pulse (!) 128   Temp 98.2 F (36.8 C) (Oral)   Resp (!) 33   LMP  (LMP Unknown)  SpO2 99%   Physical Exam Vitals and nursing note reviewed.  Constitutional:      Appearance: She is ill-appearing.  HENT:     Head: Normocephalic and atraumatic.  Eyes:     Pupils: Pupils are equal, round, and reactive to light.  Cardiovascular:     Rate and Rhythm: Regular rhythm. Tachycardia present.  Pulmonary:     Breath sounds: Normal breath sounds.     Comments: Tachypnea Abdominal:     Palpations: Abdomen is soft.     Tenderness: There is no abdominal tenderness.  Skin:    General: Skin  is warm and dry.  Neurological:     Mental Status: She is alert.     Comments: Right-sided weakness, left-sided lean Awake alert following commands  Psychiatric:        Mood and Affect: Mood normal.     (all labs ordered are listed, but only abnormal results are displayed) Labs Reviewed  CBC WITH DIFFERENTIAL/PLATELET - Abnormal; Notable for the following components:      Result Value   RDW 16.3 (*)    Platelets 106 (*)    Neutro Abs 9.1 (*)    All other components within normal limits  I-STAT CG4 LACTIC ACID, ED - Abnormal; Notable for the following components:   Lactic Acid, Venous 3.0 (*)    All other components within normal limits  CULTURE, BLOOD (ROUTINE X 2)  CULTURE, BLOOD (ROUTINE X 2)  RESP PANEL BY RT-PCR (RSV, FLU A&B, COVID)  [id number]  URINALYSIS, W/ REFLEX TO CULTURE (INFECTION SUSPECTED)  COMPREHENSIVE METABOLIC PANEL WITH GFR  I-STAT CG4 LACTIC ACID, ED    EKG: EKG Interpretation Date/Time:  Friday November 19 2024 11:16:31 EST Ventricular Rate:  120 PR Interval:  146 QRS Duration:  64 QT Interval:  334 QTC Calculation: 472 R Axis:   257  Text Interpretation: Sinus tachycardia Consider right atrial enlargement Confirmed by Cottie Cough [phone]) on 11/19/2024 3:21:16 PM  Radiology: DG Chest Portable 1 View Result Date: 11/19/2024 CLINICAL DATA:  Tachypnea EXAM: PORTABLE CHEST 1 VIEW COMPARISON:  August 31, 2024 FINDINGS: The heart size and mediastinal contours are within normal limits. Right lung is clear. Minimal left basilar subsegmental atelectasis or scarring is noted possible minimal left pleural effusion. Degenerative changes are seen involving both glenohumeral joints. IMPRESSION: Minimal left basilar subsegmental atelectasis or scarring with possible minimal left pleural effusion. Electronically Signed   By: Lynwood Landy Raddle M.D.   On: 11/19/2024 12:44     Procedures   Medications Ordered in the ED  sodium chloride  0.9 % bolus 1,000 mL (has no  administration in time range)  levofloxacin  (LEVAQUIN ) IVPB 750 mg (750 mg Intravenous New Bag/Given 11/19/24 1313)    Clinical Course as of 11/19/24 1546  Fri Nov 19, 2024  1520 Initial laboratory workup concerning for elevated venous lactic.  Chest x-ray concerning for community-acquired pneumonia.  IV fluids and Levaquin  have been ordered.     LILLETTE Ozell Marine DO, am transitioning care of this patient to the oncoming provider pending remainder of laboratory workup and likely admission [MP]    Clinical Course User Index [MP] Marine Ozell LABOR, DO                                 Medical Decision Making 54 year old female with history as above presented to the ED given concern for infection.  Likely respiratory source would also need  to consider skin soft tissue as she has a history of such along with UTI.  Will obtain broad-based infectious workup provide IV fluids for rehydration and antibiotics as indicated.  Will obtain viral respiratory swab to evaluate for viral illness.  Admission likely.  Amount and/or Complexity of Data Reviewed Labs: ordered. Radiology: ordered.  Risk Prescription drug management.        Final diagnoses:  Community acquired pneumonia, unspecified laterality    ED Discharge Orders     None          Pamella Ozell LABOR, DO 11/19/24 1546  "

## 2024-11-19 NOTE — Progress Notes (Signed)
 Pharmacy Antibiotic Note  Cindy Brooks is a 54 y.o. female admitted on 11/19/2024 with pneumonia.  Pharmacy has been consulted for vancomycin  dosing.  Plan: Vancomycin  1g IV x 1, then variable dosing due to unstable renal function Monitor renal function, Cx/PCR to narrow Vancomycin  levels as indicated     Temp (24hrs), Avg:98.2 F (36.8 C), Min:98.2 F (36.8 C), Max:98.2 F (36.8 C)  Recent Labs  Lab 11/19/24 1227 11/19/24 1238 11/19/24 1446  WBC 10.4  --   --   CREATININE  --   --  2.62*  LATICACIDVEN  --  3.0*  --     CrCl cannot be calculated (Unknown ideal weight.).    Allergies[1]  Dorn Poot, PharmD, [facility] Clinical Pharmacist ED Pharmacist Phone # [phone] 11/19/2024 6:04 PM      [1]  Allergies Allergen Reactions   Codeine Hives and Rash   Penicillins Anaphylaxis, Swelling and Other (See Comments)   Strawberry Extract Anaphylaxis and Hives   Aspirin  Hives and Rash    Was given aspirin  in the hospital in November and did fine

## 2024-11-19 NOTE — H&P (Signed)
 "  NAME:  Cindy Brooks, MRN:  [id number], DOB:  April 15, 1971, LOS: 0 ADMISSION DATE:  11/19/2024, CONSULTATION DATE:  11/19/24 REFERRING MD:  EDP, CHIEF COMPLAINT:  Tachypnea   History of Present Illness:  54 year old female with a past medical history of stroke with residual bilateral deficits, aphasia baseline per daughter holds with her hands, nods yes and no appropriately, chronic Foley, PEG, history of seizures, hypertension, CKD, history of UTIs who presented to the hospital with shortness of breath and tachypnea 1 day prior to admission.  Daughter noticed that she was breathing very quickly yesterday night and noted worsening mental status.   Of note, the patient was admitted to [facility] on 08 October 2024 and was noted at that time to be bedbound, quite weak, her sodium at that time was 166 and she had lactic acidosis very similar to her presentation today.  She was admitted to the hospital and treated with D5 water  and free water  flushes as well as broad-spectrum antibiotics.  In the ER, she was not hypoxic, she was tachycardic and tachypneic with soft blood pressure.  Laboratory workup significant for sodium of more than 170, chloride of more than 130, AKI with a creatinine of 2.62 with a baseline of 0.88.  Mild transaminitis with an AST of 86 and an ALT of 114.  Lactic acid was 3.  No leukocytosis.  Stable hemoglobin.  Mild thrombocytopenia with a platelet of 106.  Pending CT chest and CT head.    Pertinent  Medical History  As mentioned above  Significant Hospital Events: Including procedures, antibiotic start and stop dates in addition to other pertinent events   Admitted to the ICU  Interim History / Subjective:  Patient aphasic at baseline.  History obtained from chart review and from daughter on the phone  Objective    Blood pressure 101/81, pulse (!) 126, temperature 98.2 F (36.8 C), temperature source Oral, resp. rate (!) 29, SpO2 100%.        Intake/Output Summary  (Last 24 hours) at 11/19/2024 1803 Last data filed at 11/19/2024 1443 Gross per 24 hour  Intake 150 ml  Output --  Net 150 ml   There were no vitals filed for this visit.  Examination: General: Cachectic, ill-appearing, does not track with eyes HENT: Normocephalic/atraumatic Lungs: Clear to auscultation bilaterally Cardiovascular: Tachycardic, normal S1, S2 Abdomen: Soft, nontender, nondistended, PEG tube in place Extremities: Contractures, no edema Neuro: Unable to fully assess mental status, does not follow commands, does not move upper or lower extremities   Resolved problem list   Assessment and Plan   # Altered mental status # History of seizures # History of bilateral strokes with residual aphasia and limited mobility (bedbound) -Altered mental status likely in the setting of metabolic derangements.  Pending head CT   # Acute hypoxic respiratory failure Currently not hypoxic although in distress and tachypneic, likely compensatory to metabolic acidosis - No evidence of pneumonia on chest x-ray.  Pending CT chest  # Mild transaminitis Likely in the setting of hypotension.  Monitor liver enzymes  # AKI Likely hypovolemic.  Already received 1 L of fluid.  A second liter of LR ordered.  # Severe hypernatremia Started on free water  flushes  250 mL every 4 hours.  Will also add D5 water  100 mL/h for 1 L.  Water  deficit likely in the setting of poor p.o. intake, not meeting free water  demands at home  # Possible sepsis Blood cultures, UA and CT chest - Currently  covered empirically with ceftriaxone  and vancomycin  - Pending MRSA screen  Will likely need ICU for 24 hours and can be transferred to [facility] likely tomorrow.  CODE STATUS Full code per daughter      Labs   CBC: Recent Labs  Lab 11/19/24 1227  WBC 10.4  NEUTROABS 9.1*  HGB 13.9  HCT 45.0  MCV 92.2  PLT 106*    Basic Metabolic Panel: Recent Labs  Lab 11/19/24 1446  NA >170*  K 4.6  CL >130*   CO2 25  GLUCOSE 172*  BUN 94*  CREATININE 2.62*  CALCIUM  9.7   GFR: CrCl cannot be calculated (Unknown ideal weight.). Recent Labs  Lab 11/19/24 1227 11/19/24 1238  WBC 10.4  --   LATICACIDVEN  --  3.0*    Liver Function Tests: Recent Labs  Lab 11/19/24 1446  AST 86*  ALT 114*  ALKPHOS 103  BILITOT 0.5  PROT 8.3*  ALBUMIN 4.1   No results for input(s): LIPASE, AMYLASE in the last 168 hours. No results for input(s): AMMONIA in the last 168 hours.  ABG    Component Value Date/Time   PHART 7.47 (H) 02/26/2022 1725   PCO2ART 41 02/26/2022 1725   PO2ART 73 (L) 02/26/2022 1725   HCO3 29.8 (H) 02/26/2022 1725   TCO2 25 08/04/2022 1703   ACIDBASEDEF 2.0 01/21/2022 0354   O2SAT 95.5 02/26/2022 1725     Coagulation Profile: No results for input(s): INR, PROTIME in the last 168 hours.  Cardiac Enzymes: No results for input(s): CKTOTAL, CKMB, CKMBINDEX, TROPONINI in the last 168 hours.  HbA1C: Hgb A1c MFr Bld  Date/Time Value Ref Range Status  08/21/2022 05:43 AM 4.6 (L) 4.8 - 5.6 % Final    Comment:    (NOTE) Pre diabetes:          5.7%-6.4%  Diabetes:              >6.4%  Glycemic control for   <7.0% adults with diabetes   01/22/2022 03:15 AM 5.7 (H) 4.8 - 5.6 % Final    Comment:    (NOTE) Pre diabetes:          5.7%-6.4%  Diabetes:              >6.4%  Glycemic control for   <7.0% adults with diabetes     CBG: No results for input(s): GLUCAP in the last 168 hours.  Review of Systems:   As mentioned above   Past Medical History:  She,  has a past medical history of Hypertension, Hypertensive urgency (01/20/2022), Seizure (HCC) (01/28/2022), Stroke [facility]), TIA (transient ischemic attack) (2019), and Urinary tract infection (01/28/2022).   Surgical History:   Past Surgical History:  Procedure Laterality Date   ESOPHAGOGASTRODUODENOSCOPY N/A 01/28/2022   Procedure: ESOPHAGOGASTRODUODENOSCOPY (EGD);  Surgeon: Paola Dreama SAILOR,  MD;  Location: [facility] ENDOSCOPY;  Service: General;  Laterality: N/A;   IR GASTROSTOMY TUBE MOD SED  08/29/2022   IR REPLC GASTRO/COLONIC TUBE PERCUT W/FLUORO  08/31/2024   PEG PLACEMENT N/A 01/28/2022   Procedure: PERCUTANEOUS ENDOSCOPIC GASTROSTOMY (PEG) PLACEMENT;  Surgeon: Paola Dreama SAILOR, MD;  Location: [facility] ENDOSCOPY;  Service: General;  Laterality: N/A;     Social History:   reports that she has never smoked. She has never used smokeless tobacco. She reports that she does not currently use alcohol after a past usage of about 203.0 standard drinks of alcohol per week. She reports that she does not use drugs.   Family History:  Her family history includes Diabetes Mellitus II in her maternal grandmother.   Allergies Allergies[1]   Home Medications  Prior to Admission medications  Medication Sig Start Date End Date Taking? Authorizing Provider  amoxicillin-clavulanate (AUGMENTIN) 500-125 MG tablet Take 1 tablet by mouth 2 (two) times daily.    [provider]  aspirin  81 MG chewable tablet Chew 1 tablet (81 mg total) by mouth daily. Patient taking differently: Place 81 mg into feeding tube daily. 09/07/24   King, Olivia, DO  atorvastatin  (LIPITOR ) 80 MG tablet Take 1 tablet (80 mg total) by mouth daily. Patient taking differently: Place 80 mg into feeding tube daily. 09/06/24   Myrna Bitters, DO  folic acid  (FOLVITE ) 1 MG tablet Take 1 tablet (1 mg total) by mouth daily. Patient taking differently: Place 1 mg into feeding tube daily. 05/07/23   Newlin, Enobong, MD  gabapentin  (NEURONTIN ) 100 MG capsule Take 1 capsule (100 mg total) by mouth 2 (two) times daily. Patient taking differently: Take 100 mg by mouth 2 (two) times daily. Per tube 08/26/24   Delbert Clam, MD  guaifenesin  (ROBITUSSIN) 100 MG/5ML syrup Take 200 mg by mouth 3 (three) times daily as needed for cough.    [provider]  ipratropium-albuterol  (DUONEB) 0.5-2.5 (3) MG/3ML SOLN Take 3 mLs by nebulization  every 6 (six) hours as needed. 03/21/22   Gonfa, Taye T, MD  Multiple Vitamin (MULTIVITAMIN WITH MINERALS) TABS tablet Take 1 tablet by mouth daily. Patient taking differently: Place 1 tablet into feeding tube daily. 03/21/22   Gonfa, Taye T, MD  Nutritional Supplements (FEEDING SUPPLEMENT, OSMOLITE 1.5 CAL,) LIQD Place 237 mLs into feeding tube 4 (four) times daily. 1 carton Osmolite 1.5 (237 mL) - QID - Administer slowly over 15-20 minutes via gravity - Free water  flush: 30 mL before and after each bolus feed + 180 mL between feeds x 4 09/06/24   King, Olivia, DO  Nystatin  (GERHARDT'S BUTT CREAM) CREA Apply 1 application. topically daily as needed for irritation. 03/21/22   Gonfa, Taye T, MD  senna-docusate (SENOKOT-S) 8.6-50 MG tablet Take 1 tablet by mouth 2 (two) times daily between meals as needed for mild constipation. Patient taking differently: Place 1 tablet into feeding tube 2 (two) times daily between meals as needed for mild constipation. 03/21/22   Gonfa, Taye T, MD  Water  For Irrigation, Sterile (FREE WATER ) SOLN Place 180 mLs into feeding tube 4 (four) times daily. Between meals 09/06/24   Myrna Bitters, DO  Water  For Irrigation, Sterile (FREE WATER ) SOLN Place 60 mLs into feeding tube 4 (four) times daily. With meals 09/06/24   Myrna Bitters, DO     Critical care time: N/A              [1]  Allergies Allergen Reactions   Codeine Hives and Rash   Penicillins Anaphylaxis, Swelling and Other (See Comments)   Strawberry Extract Anaphylaxis and Hives   Aspirin  Hives and Rash    Was given aspirin  in the hospital in November and did fine   "

## 2024-11-19 NOTE — ED Provider Notes (Signed)
 54 yo female w/ hx of stroke, aphasia, here with increased WOB at home Tachycardic on arrival, lactic 3 initially Xray questionable left sided PNA  Treated with levaquin  and 1 liter IV fluid   Physical Exam  BP 102/88   Pulse (!) 130   Temp 98.2 F (36.8 C) (Oral)   Resp (!) 31   LMP  (LMP Unknown)   SpO2 100%   Physical Exam  Procedures  Procedures  ED Course / MDM   Clinical Course as of 11/19/24 2317  Fri Nov 19, 2024  1520 Initial laboratory workup concerning for elevated venous lactic.  Chest x-ray concerning for community-acquired pneumonia.  IV fluids and Levaquin  have been ordered.     LILLETTE Ozell Marine DO, am transitioning care of this patient to the oncoming provider pending remainder of laboratory workup and likely admission [MP]  1721 Na significantly elevated -pt has history of hypernatremia on last hospitalzation in December- will page critical care [MT]  1732 Critical care consult [MT]    Clinical Course User Index [MP] Marine Ozell LABOR, DO [MT] Cottie Donnice PARAS, MD   Medical Decision Making Amount and/or Complexity of Data Reviewed Labs: ordered. Radiology: ordered.  Risk Prescription drug management. Decision regarding hospitalization.          Cottie Donnice PARAS, MD 11/19/24 [phone]

## 2024-11-19 NOTE — ED Triage Notes (Signed)
 Pt arrived via [facility] from home. Per EMS, pt's daughter thinks pt has pneumonia, because she's breathing fast through her nose. Pt has deficits from prior stroke on right side of body and left eye. Per MS, pt leans to left side at baseline. Per EMS, pt's daughter states pt is able to say a couple of words. Pt is slightly tachypneic. Per EMS, pt bp was 110 palpated

## 2025-02-23 ENCOUNTER — Ambulatory Visit: Admitting: Family Medicine
